# Patient Record
Sex: Female | Born: 1960 | Race: White | Hispanic: No | Marital: Married | Smoking: Never smoker
Health system: Southern US, Community
[De-identification: ages and names within clinical notes are randomized; demographics above are authoritative.]

## PROBLEM LIST (undated history)

## (undated) DIAGNOSIS — M259 Joint disorder, unspecified: Secondary | ICD-10-CM

## (undated) DIAGNOSIS — M1990 Unspecified osteoarthritis, unspecified site: Secondary | ICD-10-CM

## (undated) DIAGNOSIS — C4491 Basal cell carcinoma of skin, unspecified: Secondary | ICD-10-CM

## (undated) DIAGNOSIS — F32A Depression, unspecified: Secondary | ICD-10-CM

## (undated) DIAGNOSIS — K589 Irritable bowel syndrome without diarrhea: Secondary | ICD-10-CM

## (undated) DIAGNOSIS — H35719 Central serous chorioretinopathy, unspecified eye: Secondary | ICD-10-CM

## (undated) DIAGNOSIS — B019 Varicella without complication: Secondary | ICD-10-CM

## (undated) DIAGNOSIS — Z923 Personal history of irradiation: Secondary | ICD-10-CM

## (undated) DIAGNOSIS — O24419 Gestational diabetes mellitus in pregnancy, unspecified control: Secondary | ICD-10-CM

## (undated) DIAGNOSIS — Z889 Allergy status to unspecified drugs, medicaments and biological substances status: Secondary | ICD-10-CM

## (undated) DIAGNOSIS — R011 Cardiac murmur, unspecified: Secondary | ICD-10-CM

## (undated) DIAGNOSIS — N63 Unspecified lump in unspecified breast: Secondary | ICD-10-CM

## (undated) DIAGNOSIS — R51 Headache: Secondary | ICD-10-CM

## (undated) DIAGNOSIS — R102 Pelvic and perineal pain unspecified side: Secondary | ICD-10-CM

## (undated) DIAGNOSIS — F329 Major depressive disorder, single episode, unspecified: Secondary | ICD-10-CM

## (undated) DIAGNOSIS — K219 Gastro-esophageal reflux disease without esophagitis: Secondary | ICD-10-CM

## (undated) DIAGNOSIS — K317 Polyp of stomach and duodenum: Secondary | ICD-10-CM

## (undated) DIAGNOSIS — K760 Fatty (change of) liver, not elsewhere classified: Secondary | ICD-10-CM

## (undated) DIAGNOSIS — R519 Headache, unspecified: Secondary | ICD-10-CM

## (undated) DIAGNOSIS — L659 Nonscarring hair loss, unspecified: Secondary | ICD-10-CM

## (undated) DIAGNOSIS — E669 Obesity, unspecified: Secondary | ICD-10-CM

## (undated) DIAGNOSIS — L719 Rosacea, unspecified: Secondary | ICD-10-CM

## (undated) DIAGNOSIS — I10 Essential (primary) hypertension: Secondary | ICD-10-CM

## (undated) DIAGNOSIS — C50919 Malignant neoplasm of unspecified site of unspecified female breast: Secondary | ICD-10-CM

## (undated) DIAGNOSIS — Z87898 Personal history of other specified conditions: Secondary | ICD-10-CM

## (undated) HISTORY — DX: Pelvic and perineal pain unspecified side: R10.20

## (undated) HISTORY — DX: Gestational diabetes mellitus in pregnancy, unspecified control: O24.419

## (undated) HISTORY — DX: Unspecified lump in unspecified breast: N63.0

## (undated) HISTORY — PX: ABDOMINAL HYSTERECTOMY: SHX81

## (undated) HISTORY — DX: Nonscarring hair loss, unspecified: L65.9

## (undated) HISTORY — DX: Essential (primary) hypertension: I10

## (undated) HISTORY — DX: Personal history of other specified conditions: Z87.898

## (undated) HISTORY — DX: Malignant neoplasm of unspecified site of unspecified female breast: C50.919

## (undated) HISTORY — DX: Central serous chorioretinopathy, unspecified eye: H35.719

## (undated) HISTORY — DX: Obesity, unspecified: E66.9

## (undated) HISTORY — DX: Cardiac murmur, unspecified: R01.1

## (undated) HISTORY — DX: Joint disorder, unspecified: M25.9

## (undated) HISTORY — PX: FINGER SURGERY: SHX640

## (undated) HISTORY — DX: Gastro-esophageal reflux disease without esophagitis: K21.9

## (undated) HISTORY — DX: Basal cell carcinoma of skin, unspecified: C44.91

## (undated) HISTORY — DX: Pelvic and perineal pain: R10.2

## (undated) HISTORY — PX: SKIN CANCER EXCISION: SHX779

## (undated) HISTORY — DX: Irritable bowel syndrome, unspecified: K58.9

## (undated) HISTORY — PX: BREAST SURGERY: SHX581

## (undated) HISTORY — PX: IRRIGATION AND DEBRIDEMENT SEBACEOUS CYST: SHX5255

## (undated) HISTORY — PX: FOOT SURGERY: SHX648

## (undated) HISTORY — DX: Rosacea, unspecified: L71.9

## (undated) MED FILL — Fosaprepitant Dimeglumine For IV Infusion 150 MG (Base Eq): INTRAVENOUS | Qty: 5 | Status: AC

---
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
## 1989-07-12 HISTORY — PX: WISDOM TOOTH EXTRACTION: SHX21

## 1998-02-24 ENCOUNTER — Inpatient Hospital Stay (HOSPITAL_COMMUNITY): Admission: AD | Admit: 1998-02-24 | Discharge: 1998-02-24 | Payer: Self-pay | Admitting: Obstetrics and Gynecology

## 1998-03-14 ENCOUNTER — Encounter: Admission: RE | Admit: 1998-03-14 | Discharge: 1998-06-12 | Payer: Self-pay | Admitting: *Deleted

## 1998-03-31 ENCOUNTER — Inpatient Hospital Stay (HOSPITAL_COMMUNITY): Admission: AD | Admit: 1998-03-31 | Discharge: 1998-03-31 | Payer: Self-pay | Admitting: *Deleted

## 1998-04-02 ENCOUNTER — Inpatient Hospital Stay (HOSPITAL_COMMUNITY): Admission: AD | Admit: 1998-04-02 | Discharge: 1998-04-05 | Payer: Self-pay | Admitting: *Deleted

## 1998-04-02 DIAGNOSIS — O321XX0 Maternal care for breech presentation, not applicable or unspecified: Secondary | ICD-10-CM

## 1998-04-02 DIAGNOSIS — O24419 Gestational diabetes mellitus in pregnancy, unspecified control: Secondary | ICD-10-CM

## 1998-04-05 ENCOUNTER — Encounter (HOSPITAL_COMMUNITY): Admission: RE | Admit: 1998-04-05 | Discharge: 1998-06-20 | Payer: Self-pay | Admitting: *Deleted

## 1998-05-16 ENCOUNTER — Other Ambulatory Visit: Admission: RE | Admit: 1998-05-16 | Discharge: 1998-05-16 | Payer: Self-pay | Admitting: *Deleted

## 1999-04-21 ENCOUNTER — Other Ambulatory Visit: Admission: RE | Admit: 1999-04-21 | Discharge: 1999-04-21 | Payer: Self-pay | Admitting: *Deleted

## 2000-04-04 HISTORY — PX: COLONOSCOPY: SHX174

## 2002-07-12 DIAGNOSIS — Z1371 Encounter for nonprocreative screening for genetic disease carrier status: Secondary | ICD-10-CM

## 2002-07-12 HISTORY — DX: Encounter for nonprocreative screening for genetic disease carrier status: Z13.71

## 2004-07-12 HISTORY — PX: CARDIAC CATHETERIZATION: SHX172

## 2005-04-13 ENCOUNTER — Ambulatory Visit: Payer: Self-pay | Admitting: Internal Medicine

## 2005-04-15 ENCOUNTER — Ambulatory Visit: Payer: Self-pay | Admitting: Internal Medicine

## 2005-08-12 ENCOUNTER — Ambulatory Visit: Payer: Self-pay

## 2005-08-31 ENCOUNTER — Ambulatory Visit: Payer: Self-pay

## 2006-07-14 ENCOUNTER — Ambulatory Visit: Payer: Self-pay | Admitting: Surgery

## 2006-08-12 HISTORY — PX: BREAST BIOPSY: SHX20

## 2006-08-26 ENCOUNTER — Ambulatory Visit: Payer: Self-pay | Admitting: Surgery

## 2007-05-25 ENCOUNTER — Ambulatory Visit: Payer: Self-pay | Admitting: Unknown Physician Specialty

## 2007-06-12 HISTORY — PX: LAPAROSCOPIC SUPRACERVICAL HYSTERECTOMY: SUR797

## 2007-06-15 ENCOUNTER — Ambulatory Visit: Payer: Self-pay | Admitting: Unknown Physician Specialty

## 2007-11-07 ENCOUNTER — Ambulatory Visit: Payer: Self-pay

## 2008-01-10 HISTORY — PX: MASTECTOMY PARTIAL / LUMPECTOMY: SUR851

## 2008-02-02 ENCOUNTER — Other Ambulatory Visit: Payer: Self-pay

## 2008-02-02 ENCOUNTER — Ambulatory Visit: Payer: Self-pay | Admitting: Surgery

## 2008-02-02 ENCOUNTER — Ambulatory Visit: Payer: Self-pay | Admitting: Cardiology

## 2008-02-09 ENCOUNTER — Ambulatory Visit: Payer: Self-pay | Admitting: Surgery

## 2008-02-26 ENCOUNTER — Ambulatory Visit: Payer: Self-pay | Admitting: Surgery

## 2008-02-26 HISTORY — PX: BREAST EXCISIONAL BIOPSY: SUR124

## 2008-03-12 ENCOUNTER — Ambulatory Visit: Payer: Self-pay | Admitting: Internal Medicine

## 2008-03-14 ENCOUNTER — Ambulatory Visit: Payer: Self-pay | Admitting: Internal Medicine

## 2008-04-11 ENCOUNTER — Ambulatory Visit: Payer: Self-pay | Admitting: Internal Medicine

## 2008-05-12 ENCOUNTER — Ambulatory Visit: Payer: Self-pay | Admitting: Internal Medicine

## 2008-06-11 ENCOUNTER — Ambulatory Visit: Payer: Self-pay | Admitting: Internal Medicine

## 2008-07-12 ENCOUNTER — Ambulatory Visit: Payer: Self-pay | Admitting: Internal Medicine

## 2008-08-12 ENCOUNTER — Ambulatory Visit: Payer: Self-pay | Admitting: Internal Medicine

## 2008-09-19 ENCOUNTER — Ambulatory Visit: Payer: Self-pay

## 2008-11-09 ENCOUNTER — Ambulatory Visit: Payer: Self-pay | Admitting: Internal Medicine

## 2008-11-14 ENCOUNTER — Ambulatory Visit: Payer: Self-pay | Admitting: Internal Medicine

## 2008-12-10 ENCOUNTER — Ambulatory Visit: Payer: Self-pay | Admitting: Internal Medicine

## 2008-12-17 ENCOUNTER — Ambulatory Visit: Payer: Self-pay

## 2008-12-26 ENCOUNTER — Ambulatory Visit: Payer: Self-pay

## 2009-01-02 ENCOUNTER — Ambulatory Visit: Payer: Self-pay

## 2009-01-09 ENCOUNTER — Ambulatory Visit: Payer: Self-pay | Admitting: Internal Medicine

## 2009-02-09 ENCOUNTER — Ambulatory Visit: Payer: Self-pay | Admitting: Internal Medicine

## 2009-03-06 ENCOUNTER — Ambulatory Visit: Payer: Self-pay | Admitting: Internal Medicine

## 2009-03-12 ENCOUNTER — Ambulatory Visit: Payer: Self-pay | Admitting: Internal Medicine

## 2009-03-20 ENCOUNTER — Ambulatory Visit: Payer: Self-pay | Admitting: Surgery

## 2009-05-12 ENCOUNTER — Ambulatory Visit: Payer: Self-pay | Admitting: Internal Medicine

## 2009-05-29 ENCOUNTER — Ambulatory Visit: Payer: Self-pay | Admitting: Internal Medicine

## 2009-06-11 ENCOUNTER — Ambulatory Visit: Payer: Self-pay | Admitting: Internal Medicine

## 2009-07-12 ENCOUNTER — Ambulatory Visit: Payer: Self-pay | Admitting: Internal Medicine

## 2009-08-12 ENCOUNTER — Ambulatory Visit: Payer: Self-pay | Admitting: Internal Medicine

## 2009-08-28 ENCOUNTER — Ambulatory Visit: Payer: Self-pay | Admitting: Internal Medicine

## 2009-09-09 ENCOUNTER — Ambulatory Visit: Payer: Self-pay | Admitting: Internal Medicine

## 2009-09-18 ENCOUNTER — Ambulatory Visit: Payer: Self-pay | Admitting: Surgery

## 2009-09-25 ENCOUNTER — Ambulatory Visit: Payer: Self-pay | Admitting: Internal Medicine

## 2009-10-10 ENCOUNTER — Ambulatory Visit: Payer: Self-pay | Admitting: Internal Medicine

## 2010-01-09 ENCOUNTER — Ambulatory Visit: Payer: Self-pay | Admitting: Internal Medicine

## 2010-01-30 ENCOUNTER — Ambulatory Visit: Payer: Self-pay | Admitting: Internal Medicine

## 2010-02-09 ENCOUNTER — Ambulatory Visit: Payer: Self-pay | Admitting: Internal Medicine

## 2010-03-05 ENCOUNTER — Ambulatory Visit: Payer: Self-pay | Admitting: Internal Medicine

## 2010-03-12 ENCOUNTER — Ambulatory Visit: Payer: Self-pay | Admitting: Internal Medicine

## 2010-03-20 ENCOUNTER — Ambulatory Visit: Payer: Self-pay | Admitting: Internal Medicine

## 2010-03-24 ENCOUNTER — Ambulatory Visit: Payer: Self-pay | Admitting: Surgery

## 2010-04-11 HISTORY — PX: CHOLECYSTECTOMY: SHX55

## 2010-04-20 ENCOUNTER — Ambulatory Visit: Payer: Self-pay | Admitting: Surgery

## 2010-05-26 ENCOUNTER — Ambulatory Visit: Payer: Self-pay | Admitting: Internal Medicine

## 2010-06-11 ENCOUNTER — Ambulatory Visit: Payer: Self-pay | Admitting: Internal Medicine

## 2010-09-15 ENCOUNTER — Ambulatory Visit: Payer: Self-pay | Admitting: Internal Medicine

## 2010-10-11 ENCOUNTER — Ambulatory Visit: Payer: Self-pay | Admitting: Internal Medicine

## 2011-01-12 ENCOUNTER — Ambulatory Visit: Payer: Self-pay | Admitting: Internal Medicine

## 2011-02-10 ENCOUNTER — Ambulatory Visit: Payer: Self-pay | Admitting: Internal Medicine

## 2011-02-18 ENCOUNTER — Encounter: Payer: Self-pay | Admitting: Internal Medicine

## 2011-05-06 ENCOUNTER — Ambulatory Visit: Payer: Self-pay | Admitting: Surgery

## 2011-05-19 ENCOUNTER — Ambulatory Visit: Payer: Self-pay | Admitting: Internal Medicine

## 2011-05-21 ENCOUNTER — Ambulatory Visit: Payer: Self-pay | Admitting: Internal Medicine

## 2011-06-12 ENCOUNTER — Ambulatory Visit: Payer: Self-pay | Admitting: Internal Medicine

## 2011-07-23 ENCOUNTER — Ambulatory Visit: Payer: Self-pay | Admitting: Gynecologic Oncology

## 2011-07-27 ENCOUNTER — Ambulatory Visit: Payer: Self-pay | Admitting: Internal Medicine

## 2011-08-13 ENCOUNTER — Ambulatory Visit: Payer: Self-pay | Admitting: Internal Medicine

## 2011-10-26 ENCOUNTER — Ambulatory Visit: Payer: Self-pay | Admitting: Internal Medicine

## 2011-11-10 ENCOUNTER — Ambulatory Visit: Payer: Self-pay | Admitting: Internal Medicine

## 2011-12-11 ENCOUNTER — Ambulatory Visit: Payer: Self-pay | Admitting: Internal Medicine

## 2011-12-17 ENCOUNTER — Other Ambulatory Visit: Payer: Self-pay | Admitting: Internal Medicine

## 2011-12-17 MED ORDER — LISINOPRIL 10 MG PO TABS: 10.0000 mg | ORAL_TABLET | Freq: Every day | ORAL | Status: DC

## 2011-12-17 NOTE — Telephone Encounter (Signed)
Patient has not been seen here yet.  Please advise.

## 2011-12-20 ENCOUNTER — Ambulatory Visit (INDEPENDENT_AMBULATORY_CARE_PROVIDER_SITE_OTHER): Payer: BC Managed Care – PPO | Admitting: Internal Medicine

## 2011-12-20 ENCOUNTER — Encounter: Payer: Self-pay | Admitting: Internal Medicine

## 2011-12-20 VITALS — BP 112/70 | HR 75 | Temp 98.0°F | Resp 16 | Wt 238.8 lb

## 2011-12-20 DIAGNOSIS — E669 Obesity, unspecified: Secondary | ICD-10-CM

## 2011-12-20 DIAGNOSIS — Z853 Personal history of malignant neoplasm of breast: Secondary | ICD-10-CM

## 2011-12-20 DIAGNOSIS — I10 Essential (primary) hypertension: Secondary | ICD-10-CM | POA: Insufficient documentation

## 2011-12-20 MED ORDER — AMLODIPINE BESYLATE 10 MG PO TABS: 10.0000 mg | ORAL_TABLET | Freq: Every day | ORAL | Status: DC

## 2011-12-20 MED ORDER — LISINOPRIL 10 MG PO TABS: 10.0000 mg | ORAL_TABLET | Freq: Every day | ORAL | Status: DC

## 2011-12-20 NOTE — Patient Instructions (Signed)
Try stopping amlodipine. If BP increases >140/90 consistently, then restart at Amlodipine 5mg  daily.

## 2011-12-20 NOTE — Assessment & Plan Note (Signed)
Blood pressure very well-controlled on current medications, sometimes running low. Will try holding amlodipine and monitoring blood pressure. If blood pressure rises consistently greater than 140/90, will restart at 5 mg daily. We'll continue lisinopril. Followup 6 months or sooner if needed.

## 2011-12-20 NOTE — Assessment & Plan Note (Signed)
On tamoxifen, doing fairly well. Will request records from her current oncologist. Follow up here 6 months or sooner if needed.

## 2011-12-20 NOTE — Assessment & Plan Note (Signed)
Patient making significant efforts at healthy diet and weight loss. Encouraged her to continue with this. Encouraged physical activity, with goal of 30 minutes most days of the week. Follow up 6 months or sooner if needed.

## 2011-12-20 NOTE — Progress Notes (Signed)
Subjective:    Patient ID: Jacqueline Deleon, female    DOB: 01/28/1961, 51 y.o.   MRN: [id number]  HPI 51 year old female with history of hypertension and breast cancer presents for followup. She reports she has been doing well. In regards to her hypertension, she brings record today which shows blood pressures typically in the 110s to 120s over 60s. She has periodically held her amlodipine with no significant change in her blood pressure. She denies any chest pain, palpitations, headache.  In regards to her history of breast cancer, she reports she recently saw her oncologist and plan is for her to complete 5 years of tamoxifen. She reports some increase in joint pain in this medication and she has been taking meloxicam with improvement.  In regards to her obesity, she reports she has been making effort at healthy diet and increase physical activity. She brings record showing that her weight has trended down approximately 10 pounds over the last month.  Outpatient Encounter Prescriptions as of 12/20/2011  Medication Sig Dispense Refill  . acetaminophen (TYLENOL) 325 MG tablet Take 650 mg by mouth every 6 (six) hours as needed.      Marland Kitchen amLODipine (NORVASC) 10 MG tablet Take 1 tablet (10 mg total) by mouth daily.  30 tablet  6  . ibuprofen (ADVIL,MOTRIN) 200 MG tablet Take 200 mg by mouth every 6 (six) hours as needed.      Marland Kitchen lisinopril (PRINIVIL,ZESTRIL) 10 MG tablet Take 1 tablet (10 mg total) by mouth daily.  30 tablet  6  . meloxicam (MOBIC) 15 MG tablet Take 15 mg by mouth daily.      . metroNIDAZOLE (METROGEL) 1 % gel Apply 1 application topically daily.      . minocycline (MINOCIN,DYNACIN) 100 MG capsule Take 100 mg by mouth 2 (two) times daily.      Marland Kitchen omeprazole (PRILOSEC) 20 MG capsule Take 20 mg by mouth daily.      Marland Kitchen DISCONTD: amLODipine (NORVASC) 10 MG tablet Take 10 mg by mouth daily.      Marland Kitchen DISCONTD: lisinopril (PRINIVIL,ZESTRIL) 10 MG tablet Take 1 tablet (10 mg total) by mouth daily.   90 tablet  3   BP 112/70  Pulse 75  Temp(Src) 98 F (36.7 C) (Oral)  Resp 16  Wt 238 lb 12 oz (108.296 kg)  SpO2 96%  Review of Systems  Constitutional: Negative for fever, chills, appetite change, fatigue and unexpected weight change.  HENT: Negative for ear pain, congestion, sore throat, trouble swallowing, neck pain, voice change and sinus pressure.   Eyes: Negative for visual disturbance.  Respiratory: Negative for cough, shortness of breath, wheezing and stridor.   Cardiovascular: Negative for chest pain, palpitations and leg swelling.  Gastrointestinal: Negative for nausea, vomiting, abdominal pain, diarrhea, constipation, blood in stool, abdominal distention and anal bleeding.  Genitourinary: Negative for dysuria and flank pain.  Musculoskeletal: Negative for myalgias, arthralgias and gait problem.  Skin: Negative for color change and rash.  Neurological: Negative for dizziness and headaches.  Hematological: Negative for adenopathy. Does not bruise/bleed easily.  Psychiatric/Behavioral: Negative for suicidal ideas, sleep disturbance and dysphoric mood. The patient is not nervous/anxious.        Objective:   Physical Exam  Constitutional: She is oriented to person, place, and time. She appears well-developed and well-nourished. No distress.  HENT:  Head: Normocephalic and atraumatic.  Right Ear: External ear normal.  Left Ear: External ear normal.  Nose: Nose normal.  Mouth/Throat: Oropharynx is clear and  moist. No oropharyngeal exudate.  Eyes: Conjunctivae are normal. Pupils are equal, round, and reactive to light. Right eye exhibits no discharge. Left eye exhibits no discharge. No scleral icterus.  Neck: Normal range of motion. Neck supple. No tracheal deviation present. No thyromegaly present.  Cardiovascular: Normal rate, regular rhythm, normal heart sounds and intact distal pulses.  Exam reveals no gallop and no friction rub.   No murmur heard. Pulmonary/Chest:  Effort normal and breath sounds normal. No respiratory distress. She has no wheezes. She has no rales. She exhibits no tenderness.  Musculoskeletal: Normal range of motion. She exhibits no edema and no tenderness.  Lymphadenopathy:    She has no cervical adenopathy.  Neurological: She is alert and oriented to person, place, and time. No cranial nerve deficit. She exhibits normal muscle tone. Coordination normal.  Skin: Skin is warm and dry. No rash noted. She is not diaphoretic. No erythema. No pallor.  Psychiatric: She has a normal mood and affect. Her behavior is normal. Judgment and thought content normal.          Assessment & Plan:

## 2011-12-22 ENCOUNTER — Encounter: Payer: Self-pay | Admitting: Internal Medicine

## 2012-05-12 LAB — HM MAMMOGRAPHY: HM Mammogram: NORMAL

## 2012-05-18 ENCOUNTER — Ambulatory Visit: Payer: Self-pay | Admitting: Surgery

## 2012-05-22 ENCOUNTER — Ambulatory Visit (INDEPENDENT_AMBULATORY_CARE_PROVIDER_SITE_OTHER): Payer: 59 | Admitting: Internal Medicine

## 2012-05-22 ENCOUNTER — Ambulatory Visit: Payer: Self-pay | Admitting: Internal Medicine

## 2012-05-22 ENCOUNTER — Telehealth: Payer: Self-pay | Admitting: Internal Medicine

## 2012-05-22 ENCOUNTER — Other Ambulatory Visit: Payer: Self-pay | Admitting: Internal Medicine

## 2012-05-22 ENCOUNTER — Encounter: Payer: Self-pay | Admitting: Internal Medicine

## 2012-05-22 VITALS — BP 132/62 | HR 71 | Temp 99.3°F | Ht 63.0 in | Wt 238.0 lb

## 2012-05-22 DIAGNOSIS — R1031 Right lower quadrant pain: Secondary | ICD-10-CM | POA: Insufficient documentation

## 2012-05-22 MED ORDER — CIPROFLOXACIN HCL 500 MG PO TABS: 500.0000 mg | ORAL_TABLET | Freq: Two times a day (BID) | ORAL | Status: DC

## 2012-05-22 NOTE — Telephone Encounter (Signed)
Patient advised as instructed via telephone, she wanted to know what her lab results are.  I called [facility] and spoke to CSR and they will fax the labs to our office within 3-5 minutes.

## 2012-05-22 NOTE — Assessment & Plan Note (Signed)
Symptoms and exam are suggestive of appendicitis. However, CT of the abdomen performed today showed no abnormality of the appendix or bowel. Labs today including CBC, CMP were normal. No symptoms to suggest viral gastroenteritis. Urinalysis showed blood however no nephrolithiasis noted on CT scan and no symptoms of dysuria. Will send urine for culture. Will treat empirically with Cipro. Followup in 3 days. Patient will call sooner if symptoms are worsening.

## 2012-05-22 NOTE — Progress Notes (Signed)
Subjective:    Patient ID: Jacqueline Deleon, female    DOB: Nov 26, 1960, 51 y.o.   MRN: [id number]  HPI 51 year old female presents for acute visit complaining of one-week history of right lower quadrant abdominal pain and fever. She denies any nausea, vomiting, diarrhea, constipation, blood in her stool. She reports intermittent low-grade temperature and aching, crampy right lower abdominal pain times one week. She denies any change in appetite. She denies any dysuria, hematuria, urinary urgency. She has not been taking any medication for her symptoms.  Outpatient Encounter Prescriptions as of 05/22/2012  Medication Sig Dispense Refill  . acetaminophen (TYLENOL) 325 MG tablet Take 650 mg by mouth every 6 (six) hours as needed.      Marland Kitchen amLODipine (NORVASC) 10 MG tablet Take 1 tablet (10 mg total) by mouth daily.  30 tablet  6  . ibuprofen (ADVIL,MOTRIN) 200 MG tablet Take 200 mg by mouth every 6 (six) hours as needed.      Marland Kitchen lisinopril (PRINIVIL,ZESTRIL) 10 MG tablet Take 1 tablet (10 mg total) by mouth daily.  30 tablet  6  . meloxicam (MOBIC) 15 MG tablet Take 15 mg by mouth daily.      . metroNIDAZOLE (METROGEL) 1 % gel Apply 1 application topically daily.      . minocycline (MINOCIN,DYNACIN) 100 MG capsule Take 100 mg by mouth 2 (two) times daily.      Marland Kitchen omeprazole (PRILOSEC) 20 MG capsule Take 20 mg by mouth daily.      . tamoxifen (NOLVADEX) 20 MG tablet Take 20 mg by mouth daily.       BP 132/62  Pulse 71  Temp 99.3 F (37.4 C) (Oral)  Ht 5\' 3"  (1.6 m)  Wt 238 lb (107.956 kg)  BMI 42.16 kg/m2  SpO2 98%  Review of Systems  Constitutional: Positive for fever. Negative for chills, appetite change, fatigue and unexpected weight change.  HENT: Negative for ear pain, congestion, sore throat, trouble swallowing, neck pain, voice change and sinus pressure.   Eyes: Negative for visual disturbance.  Respiratory: Negative for cough, shortness of breath, wheezing and stridor.     Cardiovascular: Negative for chest pain, palpitations and leg swelling.  Gastrointestinal: Positive for abdominal pain. Negative for nausea, vomiting, diarrhea, constipation, blood in stool, abdominal distention and anal bleeding.  Genitourinary: Negative for dysuria and flank pain.  Musculoskeletal: Negative for myalgias, arthralgias and gait problem.  Skin: Negative for color change and rash.  Neurological: Negative for dizziness and headaches.  Hematological: Negative for adenopathy. Does not bruise/bleed easily.  Psychiatric/Behavioral: Negative for suicidal ideas, sleep disturbance and dysphoric mood. The patient is not nervous/anxious.        Objective:   Physical Exam  Constitutional: She is oriented to person, place, and time. She appears well-developed and well-nourished. No distress.  HENT:  Head: Normocephalic and atraumatic.  Right Ear: External ear normal.  Left Ear: External ear normal.  Nose: Nose normal.  Mouth/Throat: Oropharynx is clear and moist. No oropharyngeal exudate.  Eyes: Conjunctivae normal are normal. Pupils are equal, round, and reactive to light. Right eye exhibits no discharge. Left eye exhibits no discharge. No scleral icterus.  Neck: Normal range of motion. Neck supple. No tracheal deviation present. No thyromegaly present.  Cardiovascular: Normal rate, regular rhythm, normal heart sounds and intact distal pulses.  Exam reveals no gallop and no friction rub.   No murmur heard. Pulmonary/Chest: Effort normal and breath sounds normal. No respiratory distress. She has no wheezes. She has  no rales. She exhibits no tenderness.  Abdominal: Soft. Bowel sounds are normal. She exhibits no distension and no mass. There is tenderness (right lower quad, mcburneys pt). There is no rebound and no guarding.  Musculoskeletal: Normal range of motion. She exhibits no edema and no tenderness.  Lymphadenopathy:    She has no cervical adenopathy.  Neurological: She is alert  and oriented to person, place, and time. No cranial nerve deficit. She exhibits normal muscle tone. Coordination normal.  Skin: Skin is warm and dry. No rash noted. She is not diaphoretic. No erythema. No pallor.  Psychiatric: She has a normal mood and affect. Her behavior is normal. Judgment and thought content normal.          Assessment & Plan:

## 2012-05-22 NOTE — Telephone Encounter (Signed)
Lab results from [facility] given to Dr. Dan Humphreys.

## 2012-05-22 NOTE — Telephone Encounter (Signed)
Labs showed blood in the urine, which may indicate renal stone or UTI.  No renal stone seen on imaging.  So, would like to treat as UTI with Cipro 500mg  po bid x 7 days.  Follow up this week as scheduled

## 2012-05-22 NOTE — Telephone Encounter (Signed)
Patient advised as instructed via telephone, Rx for Cipro sent to [facility] pharmacy, appt scheduled with Dr. Dan Humphreys on 05/26/2012 at 4:00.

## 2012-05-22 NOTE — Telephone Encounter (Signed)
Message sent to pt through MyChart CT of the abdomen was normal with no abnormalities of the appendix or bowel. It is possible that your symptoms are secondary to a viral infection. I think that we should monitor your symptoms this week, and if symptoms persist, then consider pelvic ultrasound for further evaluation.  It will also be helpful for me to see lab reports, including urinalysis, however I do not have these back yet. Please let us know if symptoms are worsening.

## 2012-05-23 LAB — URINALYSIS, ROUTINE W REFLEX MICROSCOPIC
Bilirubin, UA: NEGATIVE
Ketones, UA: NEGATIVE
Nitrite, UA: NEGATIVE
Specific Gravity, UA: 1.017 (ref 1.005–1.030)
Urobilinogen, Ur: 0.2 mg/dL (ref 0.0–1.9)

## 2012-05-23 LAB — CBC WITH DIFFERENTIAL
Basophils Absolute: 0 10*3/uL (ref 0.0–0.2)
Eosinophils Absolute: 0.1 10*3/uL (ref 0.0–0.4)
Hemoglobin: 14 g/dL (ref 11.1–15.9)
Immature Grans (Abs): 0 10*3/uL (ref 0.0–0.1)
Immature Granulocytes: 0 % (ref 0–2)
Lymphs: 26 % (ref 14–46)
MCHC: 32.9 g/dL (ref 31.5–35.7)
Neutrophils Relative %: 62 % (ref 40–74)
Platelets: 233 10*3/uL (ref 155–379)
RDW: 15.5 % — ABNORMAL HIGH (ref 12.3–15.4)

## 2012-05-23 LAB — COMPREHENSIVE METABOLIC PANEL
ALT: 25 IU/L (ref 0–32)
AST: 19 IU/L (ref 0–40)
Calcium: 9.8 mg/dL (ref 8.7–10.2)
Chloride: 106 mmol/L (ref 97–108)
Glucose: 81 mg/dL (ref 65–99)
Potassium: 4.4 mmol/L (ref 3.5–5.2)
Total Protein: 7.3 g/dL (ref 6.0–8.5)

## 2012-05-23 LAB — MICROSCOPIC EXAMINATION

## 2012-05-23 LAB — APTT: aPTT: 28 s (ref 24–33)

## 2012-05-23 LAB — PROTIME-INR: INR: 1 (ref 0.8–1.2)

## 2012-05-26 ENCOUNTER — Ambulatory Visit (INDEPENDENT_AMBULATORY_CARE_PROVIDER_SITE_OTHER): Payer: 59 | Admitting: Internal Medicine

## 2012-05-26 ENCOUNTER — Encounter: Payer: Self-pay | Admitting: Internal Medicine

## 2012-05-26 ENCOUNTER — Ambulatory Visit: Payer: 59 | Admitting: Internal Medicine

## 2012-05-26 VITALS — BP 112/72 | HR 74 | Temp 98.7°F | Ht 63.0 in | Wt 243.0 lb

## 2012-05-26 DIAGNOSIS — R1031 Right lower quadrant pain: Secondary | ICD-10-CM

## 2012-05-26 NOTE — Progress Notes (Signed)
Subjective:    Patient ID: Jacqueline Deleon, female    DOB: [birth year], 51 y.o.   MRN: [id number]  HPI 51 year old female with history of hypertension presents for followup after recent episode of right lower quadrant abdominal pain and fever. On exam earlier this week she had tenderness over McBurney's point consistent with appendicitis. She went for CT of the abdomen which showed no inflammation of the appendix. CT was completely normal. Lab work was also normal except for urinalysis which showed hematuria. She was started on Cipro for possible urinary tract infection. She reports that fever and symptoms of lower abdominal pain has completely resolved. She denies any new concerns today.  Outpatient Encounter Prescriptions as of 05/26/2012  Medication Sig Dispense Refill  . acetaminophen (TYLENOL) 325 MG tablet Take 650 mg by mouth every 6 (six) hours as needed.      Marland Kitchen amLODipine (NORVASC) 10 MG tablet Take 1 tablet (10 mg total) by mouth daily.  30 tablet  6  . ciprofloxacin (CIPRO) 500 MG tablet Take 1 tablet (500 mg total) by mouth 2 (two) times daily.  14 tablet  0  . ibuprofen (ADVIL,MOTRIN) 200 MG tablet Take 200 mg by mouth every 6 (six) hours as needed.      Marland Kitchen lisinopril (PRINIVIL,ZESTRIL) 10 MG tablet Take 1 tablet (10 mg total) by mouth daily.  30 tablet  6  . meloxicam (MOBIC) 15 MG tablet Take 15 mg by mouth daily.      . metroNIDAZOLE (METROGEL) 1 % gel Apply 1 application topically daily.      . minocycline (MINOCIN,DYNACIN) 100 MG capsule Take 100 mg by mouth 2 (two) times daily.      Marland Kitchen omeprazole (PRILOSEC) 20 MG capsule Take 20 mg by mouth daily.      . tamoxifen (NOLVADEX) 20 MG tablet Take 20 mg by mouth daily.       BP 112/72  Pulse 74  Temp 98.7 F (37.1 C) (Oral)  Ht 5\' 3"  (1.6 m)  Wt 243 lb (110.224 kg)  BMI 43.05 kg/m2  SpO2 97%  Review of Systems  Constitutional: Negative for fever, chills, appetite change, fatigue and unexpected weight change.  HENT: Negative  for ear pain, congestion, sore throat, trouble swallowing, neck pain, voice change and sinus pressure.   Eyes: Negative for visual disturbance.  Respiratory: Negative for cough, shortness of breath, wheezing and stridor.   Cardiovascular: Negative for chest pain, palpitations and leg swelling.  Gastrointestinal: Negative for nausea, vomiting, abdominal pain, diarrhea, constipation, blood in stool, abdominal distention and anal bleeding.  Genitourinary: Negative for dysuria, urgency, frequency, hematuria, flank pain, vaginal pain and pelvic pain.  Musculoskeletal: Negative for myalgias, arthralgias and gait problem.  Skin: Negative for color change and rash.  Neurological: Negative for dizziness and headaches.  Hematological: Negative for adenopathy. Does not bruise/bleed easily.  Psychiatric/Behavioral: Negative for suicidal ideas, sleep disturbance and dysphoric mood. The patient is not nervous/anxious.        Objective:   Physical Exam  Constitutional: She is oriented to person, place, and time. She appears well-developed and well-nourished. No distress.  HENT:  Head: Normocephalic and atraumatic.  Eyes: Pupils are equal, round, and reactive to light.  Neck: Normal range of motion. Neck supple.  Pulmonary/Chest: Effort normal.  Abdominal: Soft. Bowel sounds are normal. She exhibits no distension and no mass. There is no tenderness. There is no rebound and no guarding.  Neurological: She is alert and oriented to person, place, and time.  Skin: Skin is warm and dry. She is not diaphoretic.  Psychiatric: She has a normal mood and affect. Her behavior is normal. Judgment and thought content normal.          Assessment & Plan:

## 2012-05-26 NOTE — Assessment & Plan Note (Signed)
Symptoms of right lower quadrant abdominal pain have resolved. Fever has resolved. Exam normal today. CT of the abdomen was normal. Suspect that symptoms were secondary to urinary tract infection. Will continue to monitor for now. If any recurrence of symptoms, patient will call or e-mail. Followup as scheduled in 3 months or sooner as needed.

## 2012-06-06 ENCOUNTER — Encounter: Payer: Self-pay | Admitting: General Practice

## 2012-06-20 ENCOUNTER — Ambulatory Visit: Payer: BC Managed Care – PPO | Admitting: Internal Medicine

## 2012-07-20 ENCOUNTER — Ambulatory Visit: Payer: BC Managed Care – PPO | Admitting: Internal Medicine

## 2012-07-24 ENCOUNTER — Ambulatory Visit (INDEPENDENT_AMBULATORY_CARE_PROVIDER_SITE_OTHER): Payer: 59 | Admitting: Internal Medicine

## 2012-07-24 ENCOUNTER — Encounter: Payer: Self-pay | Admitting: Internal Medicine

## 2012-07-24 VITALS — BP 122/84 | HR 84 | Temp 98.8°F | Ht 63.0 in | Wt 245.0 lb

## 2012-07-24 DIAGNOSIS — I10 Essential (primary) hypertension: Secondary | ICD-10-CM

## 2012-07-24 DIAGNOSIS — R229 Localized swelling, mass and lump, unspecified: Secondary | ICD-10-CM | POA: Insufficient documentation

## 2012-07-24 DIAGNOSIS — E669 Obesity, unspecified: Secondary | ICD-10-CM

## 2012-07-24 MED ORDER — LORCASERIN HCL 10 MG PO TABS: 10.0000 mg | ORAL_TABLET | Freq: Two times a day (BID) | ORAL | Status: DC

## 2012-07-24 NOTE — Assessment & Plan Note (Signed)
BP well controlled on current medications. Will continue. Follow up 6 months and prn.

## 2012-07-24 NOTE — Progress Notes (Signed)
Subjective:    Patient ID: Jacqueline Deleon, female    DOB: 1961-05-08, 52 y.o.   MRN: [id number]  HPI 52YO female with HTN presents for follow up. BP well controlled on current meds. No HA, chest pain, palpitations. Concerned today about weight. Would like to try medication to suppress appetite. Not following any specific diet or exercise program. Has set goal of 20lb weight loss by March 2014. Also concerned about some nodular areas over abdominal wall. Unsure how long these have been present. Non-tender. Have not changed in size last few months.  Outpatient Encounter Prescriptions as of 07/24/2012  Medication Sig Dispense Refill  . acetaminophen (TYLENOL) 325 MG tablet Take 650 mg by mouth every 6 (six) hours as needed.      Marland Kitchen amLODipine (NORVASC) 10 MG tablet Take 5 mg by mouth daily.      Marland Kitchen ibuprofen (ADVIL,MOTRIN) 200 MG tablet Take 200 mg by mouth every 6 (six) hours as needed.      Marland Kitchen lisinopril (PRINIVIL,ZESTRIL) 10 MG tablet Take 1 tablet (10 mg total) by mouth daily.  30 tablet  6  . meloxicam (MOBIC) 15 MG tablet Take 15 mg by mouth daily.      . metroNIDAZOLE (METROGEL) 1 % gel Apply 1 application topically daily.      . minocycline (MINOCIN,DYNACIN) 100 MG capsule Take 100 mg by mouth 2 (two) times daily.      Marland Kitchen omeprazole (PRILOSEC) 20 MG capsule Take 20 mg by mouth daily.      . tamoxifen (NOLVADEX) 20 MG tablet Take 20 mg by mouth daily.      . [DISCONTINUED] amLODipine (NORVASC) 10 MG tablet Take 1 tablet (10 mg total) by mouth daily.  30 tablet  6  . Lorcaserin HCl (BELVIQ) 10 MG TABS Take 10 mg by mouth 2 (two) times daily.  60 tablet  1  . [DISCONTINUED] ciprofloxacin (CIPRO) 500 MG tablet Take 1 tablet (500 mg total) by mouth 2 (two) times daily.  14 tablet  0   BP 122/84  Pulse 84  Temp 98.8 F (37.1 C) (Oral)  Ht 5\' 3"  (1.6 m)  Wt 245 lb (111.131 kg)  BMI 43.40 kg/m2  SpO2 97%  Review of Systems  Constitutional: Negative for fever, chills, appetite change, fatigue  and unexpected weight change.  HENT: Negative for ear pain, congestion, sore throat, trouble swallowing, neck pain, voice change and sinus pressure.   Eyes: Negative for visual disturbance.  Respiratory: Negative for cough, shortness of breath, wheezing and stridor.   Cardiovascular: Negative for chest pain, palpitations and leg swelling.  Gastrointestinal: Negative for nausea, vomiting, abdominal pain, diarrhea, constipation, blood in stool, abdominal distention and anal bleeding.  Genitourinary: Negative for dysuria and flank pain.  Musculoskeletal: Negative for myalgias, arthralgias and gait problem.  Skin: Negative for color change and rash.  Neurological: Negative for dizziness and headaches.  Hematological: Negative for adenopathy. Does not bruise/bleed easily.  Psychiatric/Behavioral: Negative for suicidal ideas, sleep disturbance and dysphoric mood. The patient is not nervous/anxious.        Objective:   Physical Exam  Constitutional: She is oriented to person, place, and time. She appears well-developed and well-nourished. No distress.  HENT:  Head: Normocephalic and atraumatic.  Right Ear: External ear normal.  Left Ear: External ear normal.  Nose: Nose normal.  Mouth/Throat: Oropharynx is clear and moist. No oropharyngeal exudate.  Eyes: Conjunctivae normal are normal. Pupils are equal, round, and reactive to light. Right eye exhibits no  discharge. Left eye exhibits no discharge. No scleral icterus.  Neck: Normal range of motion. Neck supple. No tracheal deviation present. No thyromegaly present.  Cardiovascular: Normal rate, regular rhythm, normal heart sounds and intact distal pulses.  Exam reveals no gallop and no friction rub.   No murmur heard. Pulmonary/Chest: Effort normal and breath sounds normal. No respiratory distress. She has no wheezes. She has no rales. She exhibits no tenderness.  Abdominal: Soft. Normal appearance. There is no tenderness.    Musculoskeletal:  Normal range of motion. She exhibits no edema and no tenderness.  Lymphadenopathy:    She has no cervical adenopathy.  Neurological: She is alert and oriented to person, place, and time. No cranial nerve deficit. She exhibits normal muscle tone. Coordination normal.  Skin: Skin is warm and dry. No rash noted. She is not diaphoretic. No erythema. No pallor.  Psychiatric: She has a normal mood and affect. Her behavior is normal. Judgment and thought content normal.          Assessment & Plan:

## 2012-07-24 NOTE — Assessment & Plan Note (Signed)
Located over abdomen. Most consistent with lipoma or scar tissue from previous surgery. Discussed referral for [place] evaluation. Pt would prefer to monitor for now.

## 2012-07-24 NOTE — Patient Instructions (Signed)
[email or web address]  

## 2012-07-24 NOTE — Assessment & Plan Note (Signed)
BMI 43. Pt has set goal of losing 20lb by March. She would like to use medications to help with appetite suppression. We discussed that medications such as phentermine and Qsymia may elevate BP and would not be safe for her. Will try Belviq. Discussed potential side effects and use for 3 months.  Encouraged her to keep a food diary and set goal of <1800 calories per day.  Encouraged her to increase physical activity, with goal of 3 times per week. Follow up 1 month.

## 2012-08-12 HISTORY — PX: EYE SURGERY: SHX253

## 2012-12-07 ENCOUNTER — Ambulatory Visit: Payer: Self-pay | Admitting: Internal Medicine

## 2012-12-10 ENCOUNTER — Ambulatory Visit: Payer: Self-pay | Admitting: Internal Medicine

## 2013-01-01 ENCOUNTER — Other Ambulatory Visit: Payer: Self-pay | Admitting: *Deleted

## 2013-01-01 DIAGNOSIS — I10 Essential (primary) hypertension: Secondary | ICD-10-CM

## 2013-01-01 MED ORDER — LISINOPRIL 10 MG PO TABS: 10.0000 mg | ORAL_TABLET | Freq: Every day | ORAL | Status: DC

## 2013-01-01 NOTE — Telephone Encounter (Signed)
Eprescribed.

## 2013-02-16 ENCOUNTER — Other Ambulatory Visit: Payer: Self-pay | Admitting: *Deleted

## 2013-02-16 MED ORDER — AMLODIPINE BESYLATE 10 MG PO TABS: 5.0000 mg | ORAL_TABLET | Freq: Every day | ORAL | Status: DC

## 2013-02-16 NOTE — Telephone Encounter (Signed)
Eprescribed.

## 2013-03-05 ENCOUNTER — Telehealth: Payer: Self-pay | Admitting: Internal Medicine

## 2013-03-05 ENCOUNTER — Other Ambulatory Visit: Payer: Self-pay | Admitting: *Deleted

## 2013-03-05 DIAGNOSIS — I10 Essential (primary) hypertension: Secondary | ICD-10-CM

## 2013-03-05 MED ORDER — AMLODIPINE BESYLATE 10 MG PO TABS: 5.0000 mg | ORAL_TABLET | Freq: Every day | ORAL | Status: DC

## 2013-03-05 MED ORDER — LISINOPRIL 10 MG PO TABS: 10.0000 mg | ORAL_TABLET | Freq: Every day | ORAL | Status: DC

## 2013-03-05 NOTE — Telephone Encounter (Signed)
Patient called stating she needs a refill on her Norvasc and lisinopril, she is out and does not have a f/u until 04/23/13. She is using [facility]

## 2013-03-05 NOTE — Telephone Encounter (Signed)
Sent 3 mth refills to [facility] electronically

## 2013-03-05 NOTE — Telephone Encounter (Signed)
We can send in 3 months of refills on these meds.

## 2013-04-23 ENCOUNTER — Encounter: Payer: Self-pay | Admitting: Internal Medicine

## 2013-04-23 ENCOUNTER — Ambulatory Visit (INDEPENDENT_AMBULATORY_CARE_PROVIDER_SITE_OTHER): Payer: 59 | Admitting: Internal Medicine

## 2013-04-23 VITALS — BP 120/80 | HR 89 | Temp 98.3°F | Wt 242.0 lb

## 2013-04-23 DIAGNOSIS — I10 Essential (primary) hypertension: Secondary | ICD-10-CM

## 2013-04-23 DIAGNOSIS — L259 Unspecified contact dermatitis, unspecified cause: Secondary | ICD-10-CM | POA: Insufficient documentation

## 2013-04-23 MED ORDER — LISINOPRIL 10 MG PO TABS: 10.0000 mg | ORAL_TABLET | Freq: Every day | ORAL | Status: DC

## 2013-04-23 MED ORDER — AMLODIPINE BESYLATE 10 MG PO TABS: 5.0000 mg | ORAL_TABLET | Freq: Every day | ORAL | Status: DC

## 2013-04-23 NOTE — Assessment & Plan Note (Signed)
Symptoms consistent with contact dermatitis likely from poison ivy or poison oak. 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 She will call if symptoms not improving.

## 2013-04-23 NOTE — Assessment & Plan Note (Signed)
BP Readings from Last 3 Encounters:  04/23/13 120/80  07/24/12 122/84  05/26/12 112/72   BP generally well controlled on current medications. Will check renal function with labs today. Continue current medications.

## 2013-04-23 NOTE — Progress Notes (Signed)
Subjective:    Patient ID: Jacqueline Deleon, female    DOB: July 21, 1960, 52 y.o.   MRN: [id number]  HPI 52 year old female with history of obesity, hypertension, breast cancer presents for followup. Her primary concern today is 1-1/2 week history of rash on her lower legs and trunk. She reports she was working in her garden the next day developed a blistering rash just above her sock line on both of her legs. The rash is described as red and itchy. 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  In regards to blood pressure, she brings record with her today showing most blood pressure readings near 130/80 or less. She did have one reading of 150/86 in the setting of headache. She is compliant with medications.  Outpatient Encounter Prescriptions as of 04/23/2013  Medication Sig Dispense Refill  . acetaminophen (TYLENOL) 325 MG tablet Take 650 mg by mouth every 6 (six) hours as needed.      Marland Kitchen amLODipine (NORVASC) 10 MG tablet Take 0.5 tablets (5 mg total) by mouth daily.  90 tablet  3  . doxepin (SINEQUAN) 100 MG capsule Take 100 mg by mouth 3 (three) times daily.      Marland Kitchen ibuprofen (ADVIL,MOTRIN) 200 MG tablet Take 200 mg by mouth every 6 (six) hours as needed.      Marland Kitchen lisinopril (PRINIVIL,ZESTRIL) 10 MG tablet Take 1 tablet (10 mg total) by mouth daily.  90 tablet  3  . metroNIDAZOLE (METROGEL) 1 % gel Apply 1 application topically daily.      . minocycline (MINOCIN,DYNACIN) 100 MG capsule Take 100 mg by mouth 2 (two) times daily.      Marland Kitchen omeprazole (PRILOSEC) 20 MG capsule Take 20 mg by mouth daily.      . tamoxifen (NOLVADEX) 20 MG tablet Take 20 mg by mouth daily.      Marland Kitchen triamcinolone cream (KENALOG) 0.5 % Apply 1 application topically 3 (three) times daily.       No facility-administered encounter medications on file as of 04/23/2013.   BP 120/80  Pulse 89  Temp(Src) 98.3 F (36.8 C)  (Oral)  Wt 242 lb (109.77 kg)  BMI 42.88 kg/m2  SpO2 98%  Review of Systems  Constitutional: Negative for fever, chills, appetite change, fatigue and unexpected weight change.  HENT: Negative for congestion, ear pain, sinus pressure, sore throat, trouble swallowing and voice change.   Eyes: Negative for visual disturbance.  Respiratory: Negative for cough, shortness of breath, wheezing and stridor.   Cardiovascular: Negative for chest pain, palpitations and leg swelling.  Gastrointestinal: Negative for nausea, vomiting, abdominal pain, diarrhea, constipation, blood in stool, abdominal distention and anal bleeding.  Genitourinary: Negative for dysuria and flank pain.  Musculoskeletal: Negative for arthralgias, gait problem, myalgias and neck pain.  Skin: Positive for color change and rash.  Neurological: Negative for dizziness and headaches.  Hematological: Negative for adenopathy. Does not bruise/bleed easily.  Psychiatric/Behavioral: Negative for suicidal ideas, sleep disturbance and dysphoric mood. The patient is not nervous/anxious.        Objective:   Physical Exam  Constitutional: She is oriented to person, place, and time. She appears well-developed and well-nourished. No distress.  HENT:  Head: Normocephalic and atraumatic.  Right Ear: External ear normal.  Left Ear: External ear normal.  Nose: Nose normal.  Mouth/Throat: Oropharynx is clear and moist. No oropharyngeal exudate.  Eyes: Conjunctivae are normal.  Pupils are equal, round, and reactive to light. Right eye exhibits no discharge. Left eye exhibits no discharge. No scleral icterus.  Neck: Normal range of motion. Neck supple. No tracheal deviation present. No thyromegaly present.  Cardiovascular: Normal rate, regular rhythm, normal heart sounds and intact distal pulses.  Exam reveals no gallop and no friction rub.   No murmur heard. Pulmonary/Chest: Effort normal and breath sounds normal. No accessory muscle usage. Not  tachypneic. No respiratory distress. She has no decreased breath sounds. She has no wheezes. She has no rhonchi. She has no rales. She exhibits no tenderness.  Musculoskeletal: Normal range of motion. She exhibits no edema and no tenderness.  Lymphadenopathy:    She has no cervical adenopathy.  Neurological: She is alert and oriented to person, place, and time. No cranial nerve deficit. She exhibits normal muscle tone. Coordination normal.  Skin: Skin is warm and dry. Rash noted. Rash is vesicular (bilateral ankles, just above sock line and left lateral trunk above pantline.). She is not diaphoretic. There is erythema. No pallor.  Psychiatric: She has a normal mood and affect. Her behavior is normal. Judgment and thought content normal.          Assessment & Plan:

## 2013-05-17 ENCOUNTER — Other Ambulatory Visit: Payer: Self-pay

## 2013-05-22 ENCOUNTER — Ambulatory Visit: Payer: Self-pay | Admitting: Surgery

## 2013-06-01 ENCOUNTER — Other Ambulatory Visit: Payer: Self-pay | Admitting: *Deleted

## 2013-06-01 MED ORDER — MELOXICAM 15 MG PO TABS: 15.0000 mg | ORAL_TABLET | Freq: Every day | ORAL | Status: DC

## 2013-06-01 NOTE — Telephone Encounter (Signed)
FAXED REFILL REQUEST CAME FROM [facility]

## 2013-06-05 ENCOUNTER — Telehealth: Payer: Self-pay | Admitting: *Deleted

## 2013-06-05 MED ORDER — MELOXICAM 15 MG PO TABS: 15.0000 mg | ORAL_TABLET | Freq: Every day | ORAL | Status: DC

## 2013-06-05 NOTE — Telephone Encounter (Signed)
Ok to refill 

## 2013-06-05 NOTE — Telephone Encounter (Signed)
PHARMACY SENT FAX REFILL REQUEST FOR MELOXICAM 15 MG #30 TAKE ONE TABLET EACH DAY

## 2013-06-05 NOTE — Telephone Encounter (Signed)
PHARMACY  [facility] FAXED REFILL REQ FOR MELOXICAM 15 MG #30 TAKE ONE TABLET EACH DAY

## 2013-06-06 ENCOUNTER — Telehealth: Payer: Self-pay | Admitting: *Deleted

## 2013-06-06 NOTE — Telephone Encounter (Signed)
RECEIVED FAX REFILL REQUEST , OK TO FILL PER DR HYATT

## 2013-11-19 ENCOUNTER — Ambulatory Visit: Payer: Self-pay | Admitting: Gastroenterology

## 2013-12-10 ENCOUNTER — Ambulatory Visit: Payer: Self-pay | Admitting: Internal Medicine

## 2014-01-09 ENCOUNTER — Ambulatory Visit: Payer: Self-pay | Admitting: Internal Medicine

## 2014-02-19 ENCOUNTER — Other Ambulatory Visit: Payer: Self-pay | Admitting: Internal Medicine

## 2014-04-26 ENCOUNTER — Other Ambulatory Visit: Payer: Self-pay

## 2014-05-02 ENCOUNTER — Other Ambulatory Visit: Payer: Self-pay | Admitting: Internal Medicine

## 2014-05-30 ENCOUNTER — Ambulatory Visit: Payer: Self-pay

## 2015-02-20 ENCOUNTER — Other Ambulatory Visit: Payer: Self-pay | Admitting: Certified Nurse Midwife

## 2015-02-20 DIAGNOSIS — R748 Abnormal levels of other serum enzymes: Secondary | ICD-10-CM

## 2015-02-27 ENCOUNTER — Ambulatory Visit
Admission: RE | Admit: 2015-02-27 | Discharge: 2015-02-27 | Disposition: A | Discharge status: Home / Self Care | Payer: Commercial Managed Care - HMO | Source: Ambulatory Visit | Attending: Certified Nurse Midwife | Admitting: Certified Nurse Midwife

## 2015-02-27 DIAGNOSIS — R748 Abnormal levels of other serum enzymes: Secondary | ICD-10-CM

## 2015-02-27 DIAGNOSIS — K760 Fatty (change of) liver, not elsewhere classified: Secondary | ICD-10-CM | POA: Diagnosis not present

## 2015-02-27 DIAGNOSIS — R1011 Right upper quadrant pain: Secondary | ICD-10-CM | POA: Diagnosis present

## 2015-02-27 DIAGNOSIS — Z9049 Acquired absence of other specified parts of digestive tract: Secondary | ICD-10-CM | POA: Diagnosis not present

## 2015-04-30 ENCOUNTER — Encounter: Payer: Self-pay | Admitting: *Deleted

## 2015-05-01 ENCOUNTER — Encounter
Admission: RE | Disposition: A | Discharge status: Home / Self Care | Payer: Self-pay | Source: Ambulatory Visit | Attending: Gastroenterology

## 2015-05-01 ENCOUNTER — Ambulatory Visit: Payer: Commercial Managed Care - HMO | Admitting: Certified Registered Nurse Anesthetist

## 2015-05-01 ENCOUNTER — Encounter: Payer: Self-pay | Admitting: Anesthesiology

## 2015-05-01 ENCOUNTER — Ambulatory Visit
Admission: RE | Admit: 2015-05-01 | Discharge: 2015-05-01 | Disposition: A | Discharge status: Home / Self Care | Payer: Commercial Managed Care - HMO | Source: Ambulatory Visit | Attending: Gastroenterology | Admitting: Gastroenterology

## 2015-05-01 DIAGNOSIS — M1990 Unspecified osteoarthritis, unspecified site: Secondary | ICD-10-CM | POA: Insufficient documentation

## 2015-05-01 DIAGNOSIS — K317 Polyp of stomach and duodenum: Secondary | ICD-10-CM | POA: Insufficient documentation

## 2015-05-01 DIAGNOSIS — Z79899 Other long term (current) drug therapy: Secondary | ICD-10-CM | POA: Diagnosis not present

## 2015-05-01 DIAGNOSIS — K2950 Unspecified chronic gastritis without bleeding: Secondary | ICD-10-CM | POA: Insufficient documentation

## 2015-05-01 DIAGNOSIS — I10 Essential (primary) hypertension: Secondary | ICD-10-CM | POA: Diagnosis not present

## 2015-05-01 DIAGNOSIS — L719 Rosacea, unspecified: Secondary | ICD-10-CM | POA: Diagnosis not present

## 2015-05-01 DIAGNOSIS — K760 Fatty (change of) liver, not elsewhere classified: Secondary | ICD-10-CM | POA: Insufficient documentation

## 2015-05-01 DIAGNOSIS — R1013 Epigastric pain: Secondary | ICD-10-CM | POA: Diagnosis present

## 2015-05-01 DIAGNOSIS — Z800 Family history of malignant neoplasm of digestive organs: Secondary | ICD-10-CM | POA: Insufficient documentation

## 2015-05-01 DIAGNOSIS — Z791 Long term (current) use of non-steroidal anti-inflammatories (NSAID): Secondary | ICD-10-CM | POA: Diagnosis not present

## 2015-05-01 DIAGNOSIS — K219 Gastro-esophageal reflux disease without esophagitis: Secondary | ICD-10-CM | POA: Insufficient documentation

## 2015-05-01 DIAGNOSIS — Z853 Personal history of malignant neoplasm of breast: Secondary | ICD-10-CM | POA: Diagnosis not present

## 2015-05-01 DIAGNOSIS — Z6841 Body Mass Index (BMI) 40.0 and over, adult: Secondary | ICD-10-CM | POA: Diagnosis not present

## 2015-05-01 DIAGNOSIS — E669 Obesity, unspecified: Secondary | ICD-10-CM | POA: Insufficient documentation

## 2015-05-01 HISTORY — PX: ESOPHAGOGASTRODUODENOSCOPY (EGD) WITH PROPOFOL: SHX5813

## 2015-05-01 HISTORY — DX: Headache: R51

## 2015-05-01 HISTORY — DX: Fatty (change of) liver, not elsewhere classified: K76.0

## 2015-05-01 HISTORY — DX: Unspecified osteoarthritis, unspecified site: M19.90

## 2015-05-01 HISTORY — DX: Headache, unspecified: R51.9

## 2015-05-01 SURGERY — ESOPHAGOGASTRODUODENOSCOPY (EGD) WITH PROPOFOL
Anesthesia: General

## 2015-05-01 MED ORDER — PROPOFOL 500 MG/50ML IV EMUL
INTRAVENOUS | Status: DC | PRN
  Administered 2015-05-01: 150 ug/kg/min via INTRAVENOUS

## 2015-05-01 MED ORDER — SODIUM CHLORIDE 0.9 % IV SOLN
INTRAVENOUS | Status: DC
  Administered 2015-05-01: 1000 mL via INTRAVENOUS

## 2015-05-01 MED ORDER — PROPOFOL 10 MG/ML IV BOLUS
INTRAVENOUS | Status: DC | PRN
  Administered 2015-05-01: 50 mg via INTRAVENOUS
  Administered 2015-05-01: 20 mg via INTRAVENOUS

## 2015-05-01 MED ORDER — SODIUM CHLORIDE 0.9 % IV SOLN: INTRAVENOUS | Status: DC

## 2015-05-01 NOTE — Transfer of Care (Signed)
Immediate Anesthesia Transfer of Care Note  Patient: Jacqueline Deleon  Procedure(s) Performed: Procedure(s): ESOPHAGOGASTRODUODENOSCOPY (EGD) WITH PROPOFOL (N/A)  Patient Location: PACU  Anesthesia Type:General  Level of Consciousness: awake and alert   Airway & Oxygen Therapy: Patient Spontanous Breathing  Post-op Assessment: Report given to RN  Post vital signs: Reviewed and stable  Last Vitals:  Filed Vitals:   05/01/15 1418  BP: 122/58  Pulse: 87  Temp: 36.3 C  Resp: 17    Complications: No apparent anesthesia complications

## 2015-05-01 NOTE — Anesthesia Procedure Notes (Signed)
Performed by: Johnna Acosta Pre-anesthesia Checklist: Patient identified, Emergency Drugs available and Suction available Patient Re-evaluated:Patient Re-evaluated prior to inductionOxygen Delivery Method: Nasal cannula

## 2015-05-01 NOTE — Anesthesia Postprocedure Evaluation (Signed)
  Anesthesia Post-op Note  Patient: Jacqueline Deleon  Procedure(s) Performed: Procedure(s): ESOPHAGOGASTRODUODENOSCOPY (EGD) WITH PROPOFOL (N/A)  Anesthesia type:General  Patient location: PACU  Post pain: Pain level controlled  Post assessment: Post-op Vital signs reviewed, Patient's Cardiovascular Status Stable, Respiratory Function Stable, Patent Airway and No signs of Nausea or vomiting  Post vital signs: Reviewed and stable  Last Vitals:  Filed Vitals:   05/01/15 1450  BP: 143/70  Pulse: 71  Temp:   Resp: 19    Level of consciousness: awake, alert  and patient cooperative  Complications: No apparent anesthesia complications

## 2015-05-01 NOTE — Op Note (Signed)
[facility] Patient Name: Jacqueline Deleon Procedure Date: 05/01/2015 2:07 PM MRN: [id number] Account #: [id number] Date of Birth: Mar 08, 1961 Admit Type: Outpatient Age: 54 Room: [facility] ENDO ROOM 4 Gender: Female Note Status: Finalized Procedure:         Upper GI endoscopy Indications:       Epigastric abdominal pain, Chronic ibuprofen use. Providers:         Lupita Dawn. Candace Cruise, MD Referring MD:      Caprice Renshaw (Referring MD) Medicines:         Monitored Anesthesia Care Complications:     No immediate complications. Procedure:         Pre-Anesthesia Assessment:                    - Prior to the procedure, a History and Physical was                     performed, and patient medications, allergies and                     sensitivities were reviewed. The patient's tolerance of                     previous anesthesia was reviewed.                    - The risks and benefits of the procedure and the sedation                     options and risks were discussed with the patient. All                     questions were answered and informed consent was obtained.                    - After reviewing the risks and benefits, the patient was                     deemed in satisfactory condition to undergo the procedure.                    After obtaining informed consent, the endoscope was passed                     under direct vision. Throughout the procedure, the                     patient's blood pressure, pulse, and oxygen saturations                     were monitored continuously. The Endoscope was introduced                     through the mouth, and advanced to the second part of                     duodenum. The upper GI endoscopy was accomplished without                     difficulty. The patient tolerated the procedure well. Findings:      The examined esophagus was normal.      Multiple small sessile polyps were found in the gastric fundus. Biopsies        were taken with a  cold forceps for histology.      The exam was otherwise without abnormality.      The examined duodenum was normal. Impression:        - Normal esophagus.                    - Multiple gastric polyps. Biopsied.                    - The examination was otherwise normal.                    - Normal examined duodenum. Recommendation:    - Discharge patient to home.                    - Observe patient's clinical course.                    - Await pathology results.                    - The findings and recommendations were discussed with the                     patient.                    - Prilosec daily x 1 month. Procedure Code(s): --- Professional ---                    [phone], Esophagogastroduodenoscopy, flexible, transoral;                     with biopsy, single or multiple Diagnosis Code(s): --- Professional ---                    K31.7, Polyp of stomach and duodenum                    R10.13, Epigastric pain CPT copyright 2014 American Medical Association. All rights reserved. The codes documented in this report are preliminary and upon coder review may  be revised to meet current compliance requirements. Hulen Luster, MD 05/01/2015 2:15:53 PM This report has been signed electronically. Number of Addenda: 0 Note Initiated On: 05/01/2015 2:07 PM      [facility]

## 2015-05-01 NOTE — H&P (Signed)
  Date of Initial H&P: 04/16/2015  History reviewed, patient examined, no change in status, stable for surgery.

## 2015-05-01 NOTE — Anesthesia Preprocedure Evaluation (Signed)
Anesthesia Evaluation  Patient identified by MRN, date of birth, ID band Patient awake    Reviewed: Allergy & Precautions, H&P , NPO status , Patient's Chart, lab work & pertinent test results  History of Anesthesia Complications Negative for: history of anesthetic complications  Airway Mallampati: II  TM Distance: >3 FB Neck ROM: full    Dental no notable dental hx. (+) Teeth Intact   Pulmonary neg pulmonary ROS, neg shortness of breath,    Pulmonary exam normal breath sounds clear to auscultation       Cardiovascular Exercise Tolerance: Good hypertension, (-) angina(-) Past MI and (-) DOE Normal cardiovascular exam Rhythm:regular Rate:Normal     Neuro/Psych  Headaches, negative psych ROS   GI/Hepatic negative GI ROS, Neg liver ROS,   Endo/Other  Morbid obesity  Renal/GU negative Renal ROS  negative genitourinary   Musculoskeletal  (+) Arthritis ,   Abdominal   Peds  Hematology negative hematology ROS (+)   Anesthesia Other Findings Past Medical History:   Hypertension                                                 Cancer ([place])                                                   Comment:breast   Arthritis                                                    Fatty liver                                                  Headache                                                       Comment:migraines  Past Surgical History:   FINGER SURGERY                                                  Comment:right index, Dr. Francesco Sor in [place]                       Comment:right upper back   [place]  Comment:supercervical, Dr. Rayford Halsted   BREAST SURGERY                                                  Comment:bilateral, Dr. Tamala Julian   MASTECTOMY PARTIAL / LUMPECTOMY                                  Comment:Dr. Tamala Julian, right breast   SKIN CANCER EXCISION                                            Comment:back and calves   CHOLECYSTECTOMY                                                 Comment:Dr. [place]                                                    Comment:Dr. Hyatt  BMI    Body Mass Index   47.17 kg/m 2    Signs and symptoms suggestive of sleep apnea    Reproductive/Obstetrics negative OB ROS                             Anesthesia Physical Anesthesia Plan  ASA: III  Anesthesia Plan: General   Post-op Pain Management:    Induction:   Airway Management Planned:   Additional Equipment:   Intra-op Plan:   Post-operative Plan:   Informed Consent: I have reviewed the patients History and Physical, chart, labs and discussed the procedure including the risks, benefits and alternatives for the proposed anesthesia with the patient or authorized representative who has indicated his/her understanding and acceptance.   Dental Advisory Given  Plan Discussed with: Anesthesiologist, CRNA and Surgeon  Anesthesia Plan Comments:         Anesthesia Quick Evaluation

## 2015-05-02 ENCOUNTER — Encounter: Payer: Self-pay | Admitting: Gastroenterology

## 2015-05-06 LAB — SURGICAL PATHOLOGY

## 2015-05-09 ENCOUNTER — Other Ambulatory Visit: Payer: Self-pay | Admitting: Certified Nurse Midwife

## 2015-05-09 DIAGNOSIS — Z853 Personal history of malignant neoplasm of breast: Secondary | ICD-10-CM

## 2015-06-09 ENCOUNTER — Other Ambulatory Visit: Payer: Self-pay | Admitting: Nurse Practitioner

## 2015-06-09 ENCOUNTER — Other Ambulatory Visit: Payer: Self-pay | Admitting: Certified Nurse Midwife

## 2015-06-09 ENCOUNTER — Ambulatory Visit
Admission: RE | Admit: 2015-06-09 | Discharge: 2015-06-09 | Disposition: A | Discharge status: Home / Self Care | Payer: Commercial Managed Care - HMO | Source: Ambulatory Visit | Attending: Certified Nurse Midwife | Admitting: Certified Nurse Midwife

## 2015-06-09 DIAGNOSIS — Z9889 Other specified postprocedural states: Secondary | ICD-10-CM | POA: Diagnosis not present

## 2015-06-09 DIAGNOSIS — Z853 Personal history of malignant neoplasm of breast: Secondary | ICD-10-CM | POA: Insufficient documentation

## 2015-06-09 DIAGNOSIS — K760 Fatty (change of) liver, not elsewhere classified: Secondary | ICD-10-CM

## 2015-08-14 ENCOUNTER — Ambulatory Visit
Admission: RE | Admit: 2015-08-14 | Discharge: 2015-08-14 | Disposition: A | Discharge status: Home / Self Care | Payer: Commercial Managed Care - HMO | Source: Ambulatory Visit | Attending: Nurse Practitioner | Admitting: Nurse Practitioner

## 2015-08-14 DIAGNOSIS — Z9049 Acquired absence of other specified parts of digestive tract: Secondary | ICD-10-CM | POA: Diagnosis not present

## 2015-08-14 DIAGNOSIS — K760 Fatty (change of) liver, not elsewhere classified: Secondary | ICD-10-CM | POA: Diagnosis present

## 2015-10-27 ENCOUNTER — Encounter: Payer: Self-pay | Admitting: Dietician

## 2015-10-27 ENCOUNTER — Encounter: Payer: Commercial Managed Care - HMO | Attending: Nurse Practitioner | Admitting: Dietician

## 2015-10-27 VITALS — Ht 62.0 in | Wt 253.3 lb

## 2015-10-27 DIAGNOSIS — K7460 Unspecified cirrhosis of liver: Secondary | ICD-10-CM

## 2015-10-27 DIAGNOSIS — E669 Obesity, unspecified: Secondary | ICD-10-CM | POA: Diagnosis present

## 2015-10-27 DIAGNOSIS — R739 Hyperglycemia, unspecified: Secondary | ICD-10-CM

## 2015-10-27 DIAGNOSIS — K7469 Other cirrhosis of liver: Secondary | ICD-10-CM | POA: Diagnosis not present

## 2015-10-27 NOTE — Patient Instructions (Signed)
   Keep working to build up physical activity, by trying short-duration sessions one or more times a day.   Plan to increase vegetable intake; include a vegetable or fruit or both with each meal. Goal is at least 5 servings of these foods.   Consider tracking food intake, exercise minutes, and allow yourself a reward, such as a pedicure, new earrings, etc.   check weight at least once a week to help promote better weight loss.

## 2015-10-27 NOTE — Progress Notes (Signed)
Medical Nutrition Therapy: Visit start time: [id number]  end time: 1700  Assessment:  Diagnosis: non-alcoholic cirrhosis, obesity Past medical history: pre-diabetes, HTN, GERD, arthritis Psychosocial issues/ stress concerns: patient reports high stress level, does some stress eating. Preferred learning method:  . Auditory . Visual  Current weight: 253.3lbs  Height: 5'2" Medications, supplements: reviewed list in chart with patient  Progress and evaluation: Patient reports eating less starchy foods such as potatoes, bread; she is also working on portion control.          She states she does crave sweets at times, but is trying to limit to prevent diabetes.         She voices frustration over minimal weight loss after making multiple diet and lifestyle changes.           Physical activity: walking 15 minutes 4 times per week. Unable to increase duration due to ankle issues.       Using step tracker and currently aiming for 5000steps daily.  Dietary Intake:  Usual eating pattern includes 3 meals and 2 snacks per day. Dining out frequency: unknown.  Breakfast: 6:30-7am honey wheat toast with cheese, or banana nut granola cereal, 1/2 cup per serving size Snack: 9:30-10 (hungry) granola bar peanut butter choc chip, or trail mix, dark choc nut Lunch: can soup or sandwich from home, or tuna pkg. Fruit cup.  Snack: same as am Supper: quick and easy; frozen foods/ meals; hamburger helper; meat and salad or veg.  Snack: usually none, no fluids late to avoid nighttime urination Beverages: water, milk at breakfast (has decreased), coffee at work sometimes with creamer, diet soda, decaf tea with Splenda  Nutrition Care Education: Topics covered: weight management for cirrhosis, pre-diabetes Basic nutrition: basic food groups, appropriate nutrient balance, appropriate meal and snack schedule, general nutrition guidelines    Weight control: benefits of weight control, determining reasonable weight goal:  at least 3-5% to prevent disease progression, ideally 10% or more.      Guide for 1300kcal daily intake with 45%CHO, 25%protein, 30%fat. Discussed high fiber food choices, and inclusion of low-carb vegetables.  Diabetes: appropriate meal and snack schedule, appropriate carb intake and balance Other lifestyle changes: importance of regular exercise and strategies to minimize pain from exercise.   Nutritional Diagnosis:  [place]-3.3 Overweight/obesity As related to history of excess calorie intake, inactivity, stress.  As evidenced by patient report.  Intervention: Instruction as noted above.   Set goals with patient input.   Encouraged her to discuss possible depression and stress management with MD as she is planning.    She did not wish to schedule follow-up at this time; she will rely on friends for support.   Encouraged her to call as needed with any questions or concerns, or to report progress.  Education Materials given:  . Food lists/ Planning A Balanced Meal . Sample meal pattern/ menus: Quick and Healthy Meal Ideas . Goals/ instructions  Learner/ who was taught:  . Patient   Level of understanding: Marland Kitchen Verbalizes/ demonstrates competency  Demonstrated degree of understanding via:   Teach back Learning barriers: . None  Willingness to learn/ readiness for change: . Eager, change in progress  Monitoring and Evaluation:  Dietary intake, exercise, and body weight      follow up: prn

## 2016-02-23 DIAGNOSIS — M546 Pain in thoracic spine: Secondary | ICD-10-CM | POA: Diagnosis not present

## 2016-02-23 DIAGNOSIS — M542 Cervicalgia: Secondary | ICD-10-CM | POA: Diagnosis not present

## 2016-02-23 DIAGNOSIS — R51 Headache: Secondary | ICD-10-CM | POA: Diagnosis not present

## 2016-02-23 DIAGNOSIS — M5387 Other specified dorsopathies, lumbosacral region: Secondary | ICD-10-CM | POA: Diagnosis not present

## 2016-02-23 DIAGNOSIS — M9902 Segmental and somatic dysfunction of thoracic region: Secondary | ICD-10-CM | POA: Diagnosis not present

## 2016-02-23 DIAGNOSIS — M545 Low back pain: Secondary | ICD-10-CM | POA: Diagnosis not present

## 2016-02-23 DIAGNOSIS — M9903 Segmental and somatic dysfunction of lumbar region: Secondary | ICD-10-CM | POA: Diagnosis not present

## 2016-02-23 DIAGNOSIS — M9901 Segmental and somatic dysfunction of cervical region: Secondary | ICD-10-CM | POA: Diagnosis not present

## 2016-02-23 DIAGNOSIS — M5412 Radiculopathy, cervical region: Secondary | ICD-10-CM | POA: Diagnosis not present

## 2016-02-24 DIAGNOSIS — L821 Other seborrheic keratosis: Secondary | ICD-10-CM | POA: Diagnosis not present

## 2016-02-24 DIAGNOSIS — L720 Epidermal cyst: Secondary | ICD-10-CM | POA: Diagnosis not present

## 2016-02-24 DIAGNOSIS — L814 Other melanin hyperpigmentation: Secondary | ICD-10-CM | POA: Diagnosis not present

## 2016-02-24 DIAGNOSIS — Z1283 Encounter for screening for malignant neoplasm of skin: Secondary | ICD-10-CM | POA: Diagnosis not present

## 2016-02-24 DIAGNOSIS — L905 Scar conditions and fibrosis of skin: Secondary | ICD-10-CM | POA: Diagnosis not present

## 2016-02-24 DIAGNOSIS — D485 Neoplasm of uncertain behavior of skin: Secondary | ICD-10-CM | POA: Diagnosis not present

## 2016-02-24 DIAGNOSIS — C44719 Basal cell carcinoma of skin of left lower limb, including hip: Secondary | ICD-10-CM | POA: Diagnosis not present

## 2016-02-24 DIAGNOSIS — Z85828 Personal history of other malignant neoplasm of skin: Secondary | ICD-10-CM | POA: Diagnosis not present

## 2016-02-24 DIAGNOSIS — C44799 Other specified malignant neoplasm of skin of left lower limb, including hip: Secondary | ICD-10-CM | POA: Diagnosis not present

## 2016-02-24 DIAGNOSIS — L718 Other rosacea: Secondary | ICD-10-CM | POA: Diagnosis not present

## 2016-02-24 DIAGNOSIS — D1800 Hemangioma unspecified site: Secondary | ICD-10-CM | POA: Diagnosis not present

## 2016-02-25 DIAGNOSIS — H35712 Central serous chorioretinopathy, left eye: Secondary | ICD-10-CM | POA: Diagnosis not present

## 2016-02-25 DIAGNOSIS — H2513 Age-related nuclear cataract, bilateral: Secondary | ICD-10-CM | POA: Diagnosis not present

## 2016-03-03 ENCOUNTER — Other Ambulatory Visit: Payer: Self-pay | Admitting: Nurse Practitioner

## 2016-03-03 DIAGNOSIS — K7460 Unspecified cirrhosis of liver: Secondary | ICD-10-CM

## 2016-03-03 DIAGNOSIS — R748 Abnormal levels of other serum enzymes: Secondary | ICD-10-CM

## 2016-03-03 DIAGNOSIS — K7581 Nonalcoholic steatohepatitis (NASH): Principal | ICD-10-CM

## 2016-03-03 DIAGNOSIS — F329 Major depressive disorder, single episode, unspecified: Secondary | ICD-10-CM | POA: Diagnosis not present

## 2016-03-04 DIAGNOSIS — R748 Abnormal levels of other serum enzymes: Secondary | ICD-10-CM | POA: Diagnosis not present

## 2016-03-04 DIAGNOSIS — K7460 Unspecified cirrhosis of liver: Secondary | ICD-10-CM | POA: Diagnosis not present

## 2016-03-04 DIAGNOSIS — K7581 Nonalcoholic steatohepatitis (NASH): Secondary | ICD-10-CM | POA: Diagnosis not present

## 2016-03-05 DIAGNOSIS — F419 Anxiety disorder, unspecified: Secondary | ICD-10-CM | POA: Diagnosis not present

## 2016-03-09 ENCOUNTER — Ambulatory Visit
Admission: RE | Admit: 2016-03-09 | Discharge: 2016-03-09 | Disposition: A | Discharge status: Home / Self Care | Payer: 59 | Source: Ambulatory Visit | Attending: Nurse Practitioner | Admitting: Nurse Practitioner

## 2016-03-09 DIAGNOSIS — K7581 Nonalcoholic steatohepatitis (NASH): Secondary | ICD-10-CM | POA: Insufficient documentation

## 2016-03-09 DIAGNOSIS — R748 Abnormal levels of other serum enzymes: Secondary | ICD-10-CM | POA: Diagnosis not present

## 2016-03-09 DIAGNOSIS — K7460 Unspecified cirrhosis of liver: Secondary | ICD-10-CM | POA: Diagnosis not present

## 2016-03-12 DIAGNOSIS — C4491 Basal cell carcinoma of skin, unspecified: Secondary | ICD-10-CM

## 2016-03-12 HISTORY — DX: Basal cell carcinoma of skin, unspecified: C44.91

## 2016-03-22 DIAGNOSIS — D239 Other benign neoplasm of skin, unspecified: Secondary | ICD-10-CM | POA: Diagnosis not present

## 2016-03-22 DIAGNOSIS — C44719 Basal cell carcinoma of skin of left lower limb, including hip: Secondary | ICD-10-CM | POA: Diagnosis not present

## 2016-03-22 DIAGNOSIS — Z85828 Personal history of other malignant neoplasm of skin: Secondary | ICD-10-CM | POA: Diagnosis not present

## 2016-04-12 DIAGNOSIS — M5387 Other specified dorsopathies, lumbosacral region: Secondary | ICD-10-CM | POA: Diagnosis not present

## 2016-04-12 DIAGNOSIS — R51 Headache: Secondary | ICD-10-CM | POA: Diagnosis not present

## 2016-04-12 DIAGNOSIS — M9903 Segmental and somatic dysfunction of lumbar region: Secondary | ICD-10-CM | POA: Diagnosis not present

## 2016-04-12 DIAGNOSIS — M9902 Segmental and somatic dysfunction of thoracic region: Secondary | ICD-10-CM | POA: Diagnosis not present

## 2016-04-12 DIAGNOSIS — M546 Pain in thoracic spine: Secondary | ICD-10-CM | POA: Diagnosis not present

## 2016-04-12 DIAGNOSIS — M5412 Radiculopathy, cervical region: Secondary | ICD-10-CM | POA: Diagnosis not present

## 2016-04-12 DIAGNOSIS — M542 Cervicalgia: Secondary | ICD-10-CM | POA: Diagnosis not present

## 2016-04-12 DIAGNOSIS — M9901 Segmental and somatic dysfunction of cervical region: Secondary | ICD-10-CM | POA: Diagnosis not present

## 2016-04-12 DIAGNOSIS — M545 Low back pain: Secondary | ICD-10-CM | POA: Diagnosis not present

## 2016-04-29 DIAGNOSIS — E119 Type 2 diabetes mellitus without complications: Secondary | ICD-10-CM | POA: Insufficient documentation

## 2016-04-29 DIAGNOSIS — Z0000 Encounter for general adult medical examination without abnormal findings: Secondary | ICD-10-CM | POA: Diagnosis not present

## 2016-04-29 DIAGNOSIS — F411 Generalized anxiety disorder: Secondary | ICD-10-CM | POA: Insufficient documentation

## 2016-04-29 DIAGNOSIS — R739 Hyperglycemia, unspecified: Secondary | ICD-10-CM | POA: Diagnosis not present

## 2016-04-29 DIAGNOSIS — Z23 Encounter for immunization: Secondary | ICD-10-CM | POA: Diagnosis not present

## 2016-04-29 DIAGNOSIS — Z79899 Other long term (current) drug therapy: Secondary | ICD-10-CM | POA: Diagnosis not present

## 2016-05-20 ENCOUNTER — Other Ambulatory Visit: Payer: Self-pay | Admitting: Certified Nurse Midwife

## 2016-05-20 DIAGNOSIS — Z1231 Encounter for screening mammogram for malignant neoplasm of breast: Secondary | ICD-10-CM

## 2016-06-14 DIAGNOSIS — M5412 Radiculopathy, cervical region: Secondary | ICD-10-CM | POA: Diagnosis not present

## 2016-06-14 DIAGNOSIS — M542 Cervicalgia: Secondary | ICD-10-CM | POA: Diagnosis not present

## 2016-06-14 DIAGNOSIS — M9901 Segmental and somatic dysfunction of cervical region: Secondary | ICD-10-CM | POA: Diagnosis not present

## 2016-06-14 DIAGNOSIS — M5387 Other specified dorsopathies, lumbosacral region: Secondary | ICD-10-CM | POA: Diagnosis not present

## 2016-06-14 DIAGNOSIS — M546 Pain in thoracic spine: Secondary | ICD-10-CM | POA: Diagnosis not present

## 2016-06-14 DIAGNOSIS — R51 Headache: Secondary | ICD-10-CM | POA: Diagnosis not present

## 2016-06-14 DIAGNOSIS — M9902 Segmental and somatic dysfunction of thoracic region: Secondary | ICD-10-CM | POA: Diagnosis not present

## 2016-06-14 DIAGNOSIS — M9903 Segmental and somatic dysfunction of lumbar region: Secondary | ICD-10-CM | POA: Diagnosis not present

## 2016-06-14 DIAGNOSIS — M545 Low back pain: Secondary | ICD-10-CM | POA: Diagnosis not present

## 2016-06-29 ENCOUNTER — Ambulatory Visit
Admission: RE | Admit: 2016-06-29 | Discharge: 2016-06-29 | Disposition: A | Discharge status: Home / Self Care | Payer: 59 | Source: Ambulatory Visit | Attending: Certified Nurse Midwife | Admitting: Certified Nurse Midwife

## 2016-06-29 DIAGNOSIS — Z1231 Encounter for screening mammogram for malignant neoplasm of breast: Secondary | ICD-10-CM | POA: Insufficient documentation

## 2016-07-01 DIAGNOSIS — Z124 Encounter for screening for malignant neoplasm of cervix: Secondary | ICD-10-CM | POA: Diagnosis not present

## 2016-07-01 DIAGNOSIS — Z13220 Encounter for screening for lipoid disorders: Secondary | ICD-10-CM | POA: Diagnosis not present

## 2016-07-01 DIAGNOSIS — Z853 Personal history of malignant neoplasm of breast: Secondary | ICD-10-CM | POA: Diagnosis not present

## 2016-07-01 DIAGNOSIS — Z01419 Encounter for gynecological examination (general) (routine) without abnormal findings: Secondary | ICD-10-CM | POA: Diagnosis not present

## 2016-07-12 DIAGNOSIS — E669 Obesity, unspecified: Secondary | ICD-10-CM

## 2016-07-12 HISTORY — DX: Obesity, unspecified: E66.9

## 2016-07-19 DIAGNOSIS — M5387 Other specified dorsopathies, lumbosacral region: Secondary | ICD-10-CM | POA: Diagnosis not present

## 2016-07-19 DIAGNOSIS — M545 Low back pain: Secondary | ICD-10-CM | POA: Diagnosis not present

## 2016-07-19 DIAGNOSIS — M9902 Segmental and somatic dysfunction of thoracic region: Secondary | ICD-10-CM | POA: Diagnosis not present

## 2016-07-19 DIAGNOSIS — M9903 Segmental and somatic dysfunction of lumbar region: Secondary | ICD-10-CM | POA: Diagnosis not present

## 2016-07-19 DIAGNOSIS — M5412 Radiculopathy, cervical region: Secondary | ICD-10-CM | POA: Diagnosis not present

## 2016-07-19 DIAGNOSIS — M542 Cervicalgia: Secondary | ICD-10-CM | POA: Diagnosis not present

## 2016-07-19 DIAGNOSIS — R51 Headache: Secondary | ICD-10-CM | POA: Diagnosis not present

## 2016-07-19 DIAGNOSIS — M546 Pain in thoracic spine: Secondary | ICD-10-CM | POA: Diagnosis not present

## 2016-07-19 DIAGNOSIS — M9901 Segmental and somatic dysfunction of cervical region: Secondary | ICD-10-CM | POA: Diagnosis not present

## 2016-08-23 DIAGNOSIS — M546 Pain in thoracic spine: Secondary | ICD-10-CM | POA: Diagnosis not present

## 2016-08-23 DIAGNOSIS — M9902 Segmental and somatic dysfunction of thoracic region: Secondary | ICD-10-CM | POA: Diagnosis not present

## 2016-08-23 DIAGNOSIS — M545 Low back pain: Secondary | ICD-10-CM | POA: Diagnosis not present

## 2016-08-23 DIAGNOSIS — M5412 Radiculopathy, cervical region: Secondary | ICD-10-CM | POA: Diagnosis not present

## 2016-08-23 DIAGNOSIS — M542 Cervicalgia: Secondary | ICD-10-CM | POA: Diagnosis not present

## 2016-08-23 DIAGNOSIS — R51 Headache: Secondary | ICD-10-CM | POA: Diagnosis not present

## 2016-08-23 DIAGNOSIS — M9901 Segmental and somatic dysfunction of cervical region: Secondary | ICD-10-CM | POA: Diagnosis not present

## 2016-08-23 DIAGNOSIS — M9903 Segmental and somatic dysfunction of lumbar region: Secondary | ICD-10-CM | POA: Diagnosis not present

## 2016-08-23 DIAGNOSIS — M5387 Other specified dorsopathies, lumbosacral region: Secondary | ICD-10-CM | POA: Diagnosis not present

## 2016-08-27 DIAGNOSIS — J029 Acute pharyngitis, unspecified: Secondary | ICD-10-CM | POA: Diagnosis not present

## 2016-09-01 ENCOUNTER — Other Ambulatory Visit: Payer: Self-pay | Admitting: Gastroenterology

## 2016-09-01 DIAGNOSIS — K7460 Unspecified cirrhosis of liver: Secondary | ICD-10-CM | POA: Diagnosis not present

## 2016-09-01 DIAGNOSIS — R7989 Other specified abnormal findings of blood chemistry: Secondary | ICD-10-CM | POA: Diagnosis not present

## 2016-09-03 DIAGNOSIS — H8110 Benign paroxysmal vertigo, unspecified ear: Secondary | ICD-10-CM | POA: Diagnosis not present

## 2016-09-14 ENCOUNTER — Ambulatory Visit
Admission: RE | Admit: 2016-09-14 | Discharge: 2016-09-14 | Disposition: A | Discharge status: Home / Self Care | Payer: 59 | Source: Ambulatory Visit | Attending: Gastroenterology | Admitting: Gastroenterology

## 2016-09-14 DIAGNOSIS — Z9049 Acquired absence of other specified parts of digestive tract: Secondary | ICD-10-CM | POA: Insufficient documentation

## 2016-09-14 DIAGNOSIS — K760 Fatty (change of) liver, not elsewhere classified: Secondary | ICD-10-CM | POA: Insufficient documentation

## 2016-09-14 DIAGNOSIS — K7460 Unspecified cirrhosis of liver: Secondary | ICD-10-CM | POA: Insufficient documentation

## 2016-09-27 DIAGNOSIS — M546 Pain in thoracic spine: Secondary | ICD-10-CM | POA: Diagnosis not present

## 2016-09-27 DIAGNOSIS — M9903 Segmental and somatic dysfunction of lumbar region: Secondary | ICD-10-CM | POA: Diagnosis not present

## 2016-09-27 DIAGNOSIS — M9902 Segmental and somatic dysfunction of thoracic region: Secondary | ICD-10-CM | POA: Diagnosis not present

## 2016-09-27 DIAGNOSIS — M9901 Segmental and somatic dysfunction of cervical region: Secondary | ICD-10-CM | POA: Diagnosis not present

## 2016-09-27 DIAGNOSIS — M5387 Other specified dorsopathies, lumbosacral region: Secondary | ICD-10-CM | POA: Diagnosis not present

## 2016-09-27 DIAGNOSIS — M5412 Radiculopathy, cervical region: Secondary | ICD-10-CM | POA: Diagnosis not present

## 2016-09-27 DIAGNOSIS — M542 Cervicalgia: Secondary | ICD-10-CM | POA: Diagnosis not present

## 2016-09-27 DIAGNOSIS — R51 Headache: Secondary | ICD-10-CM | POA: Diagnosis not present

## 2016-09-27 DIAGNOSIS — M545 Low back pain: Secondary | ICD-10-CM | POA: Diagnosis not present

## 2016-10-04 DIAGNOSIS — D239 Other benign neoplasm of skin, unspecified: Secondary | ICD-10-CM | POA: Diagnosis not present

## 2016-10-04 DIAGNOSIS — Z85828 Personal history of other malignant neoplasm of skin: Secondary | ICD-10-CM | POA: Diagnosis not present

## 2016-10-06 ENCOUNTER — Encounter: Payer: Self-pay | Admitting: Certified Nurse Midwife

## 2016-10-06 ENCOUNTER — Ambulatory Visit (INDEPENDENT_AMBULATORY_CARE_PROVIDER_SITE_OTHER): Payer: 59 | Admitting: Certified Nurse Midwife

## 2016-10-06 VITALS — BP 120/80 | HR 74 | Ht 62.0 in | Wt 252.0 lb

## 2016-10-06 DIAGNOSIS — R2231 Localized swelling, mass and lump, right upper limb: Secondary | ICD-10-CM | POA: Diagnosis not present

## 2016-10-06 DIAGNOSIS — Z853 Personal history of malignant neoplasm of breast: Secondary | ICD-10-CM | POA: Diagnosis not present

## 2016-10-06 NOTE — Progress Notes (Signed)
Chief Complaint: "I felt a lump under my left arm yesterday. It has been achy for a while."  HPI: Jacqueline Deleon is a 56 year old G1 P1 WF with history of a partial right mastectomy for infiltrating ductal carcinoma in 2009. She presents with the above chief complaint. Denies any skin lesions on her arm or chest area. Most recent mammogram was 06/29/2016 and was negative.    Past Medical History:  Diagnosis Date  . Arthritis   . 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 Central serous retinopathy   . Fatty liver   . GERD (gastroesophageal reflux disease)   . Gestational diabetes   . Headache    migraines  . Hypertension   . IBS (irritable bowel syndrome)   . Obesity 2018   BMI 45  . Rosacea    Past Surgical History:  Procedure Laterality Date  . BREAST BIOPSY Left 08/2006   benign fibroadenoma  . BREAST SURGERY Right    x2/ Dr Tamala Julian  . CARDIAC CATHETERIZATION  2006  . CESAREAN SECTION  04/02/1998  . CHOLECYSTECTOMY  04/2010   Dr. Smith/ laprascopic surgery  . COLONOSCOPY  04/04/2000  . ESOPHAGOGASTRODUODENOSCOPY (EGD) WITH PROPOFOL N/A 05/01/2015   Procedure: ESOPHAGOGASTRODUODENOSCOPY (EGD) WITH PROPOFOL;  Surgeon: Hulen Luster, MD;  Location: [facility] ENDOSCOPY;  Service: Gastroenterology;  Laterality: N/A;  . EYE SURGERY  08/2012   laser surgery on retina  . FINGER SURGERY     repair of tendon in right index, Dr. Francesco Sor in [place]  . FOOT SURGERY     Dr. Milinda Pointer  . IRRIGATION AND DEBRIDEMENT SEBACEOUS CYST     right upper back  . LAPAROSCOPIC SUPRACERVICAL HYSTERECTOMY  06/2007   Dr Rayford Halsted  . MASTECTOMY PARTIAL / LUMPECTOMY Right 01/2008   with sentinal lymph node biopsy/ infiltrating ductal cancer  . SKIN CANCER EXCISION     back and calves  . [place] EXTRACTION  1991   Social History   Social History  . Marital status: Married    Spouse name: N/A  . Number of children: N/A   . Years of education: N/A   Occupational History  . Not on file.   Social History Main Topics  . Smoking status: Never Smoker  . Smokeless tobacco: Never Used  . Alcohol use 0.0 oz/week     Comment: rare, 1-2 drinks per year  . Drug use: No  . Sexual activity: Not on file   Other Topics Concern  . Not on file   Social History Narrative   Lives in [place] with husband, no pets.  Works at [employer].      Diet - regular   Exercise - occasional   ROS: Constitutional : no fever Breast ROS: positive for lump in right axillary area; negative for inflammation, nipple discharge  Skin: no rash or inflammation  Physical exam: General: WF in NAD Vital signs: BP 120/80   Pulse 74   Ht 5\' 2"  (1.575 m)   Wt 114.3 kg (252 lb)   BMI 46.09 kg/m   Breast: 1 cm soft, oval NT mass palpated with patient sitting in anterior axillary line Right breast: skin changes due to hx of lumpectomy and radiation (no recent changes), no masses, NT Left breast: no skin changes, no masses, no nipple discharge  Skin: no boils or folliculitis in right axilla or right arm.  Lymph: no infraclavicular, supraclavicular or cervical LAN  A: right axillary mass-possible lymph node enlargement Hx of infiltrating ductal carcinoma of right breast  P: Referral to breast surgeon, Dr Nicholes Stairs for further evaluation.   Made an appt for 5 April at 14:30 Will try to schedule diagnostic mammogram and ultrasound on the right prior to that appt.  Dalia Heading, CNM

## 2016-10-13 ENCOUNTER — Ambulatory Visit
Admission: RE | Admit: 2016-10-13 | Discharge: 2016-10-13 | Disposition: A | Discharge status: Home / Self Care | Payer: 59 | Source: Ambulatory Visit | Attending: Certified Nurse Midwife | Admitting: Certified Nurse Midwife

## 2016-10-13 ENCOUNTER — Other Ambulatory Visit: Payer: Self-pay | Admitting: Certified Nurse Midwife

## 2016-10-13 DIAGNOSIS — Z853 Personal history of malignant neoplasm of breast: Secondary | ICD-10-CM | POA: Diagnosis not present

## 2016-10-13 DIAGNOSIS — R928 Other abnormal and inconclusive findings on diagnostic imaging of breast: Secondary | ICD-10-CM | POA: Diagnosis not present

## 2016-10-13 DIAGNOSIS — R229 Localized swelling, mass and lump, unspecified: Secondary | ICD-10-CM | POA: Diagnosis not present

## 2016-10-13 DIAGNOSIS — R2231 Localized swelling, mass and lump, right upper limb: Secondary | ICD-10-CM

## 2016-10-14 DIAGNOSIS — D1720 Benign lipomatous neoplasm of skin and subcutaneous tissue of unspecified limb: Secondary | ICD-10-CM | POA: Diagnosis not present

## 2016-10-14 DIAGNOSIS — Z853 Personal history of malignant neoplasm of breast: Secondary | ICD-10-CM | POA: Diagnosis not present

## 2016-10-26 ENCOUNTER — Other Ambulatory Visit: Payer: Self-pay | Admitting: Certified Nurse Midwife

## 2016-10-26 DIAGNOSIS — K760 Fatty (change of) liver, not elsewhere classified: Secondary | ICD-10-CM

## 2016-10-26 DIAGNOSIS — I10 Essential (primary) hypertension: Secondary | ICD-10-CM

## 2016-10-26 DIAGNOSIS — Z579 Occupational exposure to unspecified risk factor: Secondary | ICD-10-CM

## 2016-10-26 DIAGNOSIS — R7309 Other abnormal glucose: Secondary | ICD-10-CM

## 2016-10-27 ENCOUNTER — Other Ambulatory Visit: Payer: 59

## 2016-10-27 DIAGNOSIS — K760 Fatty (change of) liver, not elsewhere classified: Secondary | ICD-10-CM

## 2016-10-27 DIAGNOSIS — I10 Essential (primary) hypertension: Secondary | ICD-10-CM | POA: Diagnosis not present

## 2016-10-27 DIAGNOSIS — Z579 Occupational exposure to unspecified risk factor: Secondary | ICD-10-CM

## 2016-10-27 DIAGNOSIS — R7309 Other abnormal glucose: Secondary | ICD-10-CM

## 2016-10-28 LAB — COMPREHENSIVE METABOLIC PANEL
ALT: 15 IU/L (ref 0–32)
AST: 15 IU/L (ref 0–40)
Albumin/Globulin Ratio: 1.4 (ref 1.2–2.2)
Albumin: 4.2 g/dL (ref 3.5–5.5)
Alkaline Phosphatase: 129 IU/L — ABNORMAL HIGH (ref 39–117)
BILIRUBIN TOTAL: 0.3 mg/dL (ref 0.0–1.2)
BUN/Creatinine Ratio: 17 (ref 9–23)
BUN: 13 mg/dL (ref 6–24)
CHLORIDE: 103 mmol/L (ref 96–106)
CO2: 23 mmol/L (ref 18–29)
CREATININE: 0.78 mg/dL (ref 0.57–1.00)
Calcium: 9.1 mg/dL (ref 8.7–10.2)
GFR calc Af Amer: 100 mL/min/{1.73_m2} (ref >59)
GFR calc non Af Amer: 87 mL/min/{1.73_m2} (ref >59)
GLOBULIN, TOTAL: 2.9 g/dL (ref 1.5–4.5)
GLUCOSE: 91 mg/dL (ref 65–99)
Potassium: 4.5 mmol/L (ref 3.5–5.2)
SODIUM: 142 mmol/L (ref 134–144)
Total Protein: 7.1 g/dL (ref 6.0–8.5)

## 2016-10-28 LAB — CBC WITH DIFFERENTIAL/PLATELET
BASOS ABS: 0 10*3/uL (ref 0.0–0.2)
Basos: 0 %
EOS (ABSOLUTE): 0.2 10*3/uL (ref 0.0–0.4)
EOS: 2 %
HEMATOCRIT: 41.5 % (ref 34.0–46.6)
Hemoglobin: 13.1 g/dL (ref 11.1–15.9)
IMMATURE GRANULOCYTES: 0 %
Immature Grans (Abs): 0 10*3/uL (ref 0.0–0.1)
Lymphocytes Absolute: 1.8 10*3/uL (ref 0.7–3.1)
Lymphs: 24 %
MCH: 25.4 pg — ABNORMAL LOW (ref 26.6–33.0)
MCHC: 31.6 g/dL (ref 31.5–35.7)
MCV: 81 fL (ref 79–97)
MONOS ABS: 0.5 10*3/uL (ref 0.1–0.9)
Monocytes: 7 %
NEUTROS PCT: 67 %
Neutrophils Absolute: 5.1 10*3/uL (ref 1.4–7.0)
PLATELETS: 252 10*3/uL (ref 150–379)
RBC: 5.15 x10E6/uL (ref 3.77–5.28)
RDW: 16.3 % — ABNORMAL HIGH (ref 12.3–15.4)
WBC: 7.5 10*3/uL (ref 3.4–10.8)

## 2016-10-28 LAB — LIPID PANEL WITH LDL/HDL RATIO
CHOLESTEROL TOTAL: 178 mg/dL (ref 100–199)
HDL: 51 mg/dL (ref >39)
LDL CALC: 103 mg/dL — AB (ref 0–99)
LDl/HDL Ratio: 2 ratio (ref 0.0–3.2)
TRIGLYCERIDES: 118 mg/dL (ref 0–149)
VLDL Cholesterol Cal: 24 mg/dL (ref 5–40)

## 2016-10-28 LAB — HEMOGLOBIN A1C
ESTIMATED AVERAGE GLUCOSE: 117 mg/dL
Hgb A1c MFr Bld: 5.7 % — ABNORMAL HIGH (ref 4.8–5.6)

## 2016-10-28 LAB — HIV ANTIBODY (ROUTINE TESTING W REFLEX): HIV SCREEN 4TH GENERATION: NONREACTIVE

## 2016-10-28 LAB — TSH: TSH: 3.18 u[IU]/mL (ref 0.450–4.500)

## 2016-11-01 DIAGNOSIS — E119 Type 2 diabetes mellitus without complications: Secondary | ICD-10-CM | POA: Diagnosis not present

## 2016-11-01 DIAGNOSIS — F411 Generalized anxiety disorder: Secondary | ICD-10-CM | POA: Diagnosis not present

## 2016-11-01 DIAGNOSIS — I10 Essential (primary) hypertension: Secondary | ICD-10-CM | POA: Diagnosis not present

## 2016-11-01 DIAGNOSIS — Z79899 Other long term (current) drug therapy: Secondary | ICD-10-CM | POA: Diagnosis not present

## 2016-11-29 DIAGNOSIS — M9901 Segmental and somatic dysfunction of cervical region: Secondary | ICD-10-CM | POA: Diagnosis not present

## 2016-11-29 DIAGNOSIS — M546 Pain in thoracic spine: Secondary | ICD-10-CM | POA: Diagnosis not present

## 2016-11-29 DIAGNOSIS — M5387 Other specified dorsopathies, lumbosacral region: Secondary | ICD-10-CM | POA: Diagnosis not present

## 2016-11-29 DIAGNOSIS — M5412 Radiculopathy, cervical region: Secondary | ICD-10-CM | POA: Diagnosis not present

## 2016-11-29 DIAGNOSIS — M542 Cervicalgia: Secondary | ICD-10-CM | POA: Diagnosis not present

## 2016-11-29 DIAGNOSIS — R51 Headache: Secondary | ICD-10-CM | POA: Diagnosis not present

## 2016-11-29 DIAGNOSIS — M9903 Segmental and somatic dysfunction of lumbar region: Secondary | ICD-10-CM | POA: Diagnosis not present

## 2016-11-29 DIAGNOSIS — M545 Low back pain: Secondary | ICD-10-CM | POA: Diagnosis not present

## 2016-11-29 DIAGNOSIS — M9902 Segmental and somatic dysfunction of thoracic region: Secondary | ICD-10-CM | POA: Diagnosis not present

## 2016-12-22 DIAGNOSIS — I10 Essential (primary) hypertension: Secondary | ICD-10-CM | POA: Diagnosis not present

## 2016-12-22 DIAGNOSIS — E119 Type 2 diabetes mellitus without complications: Secondary | ICD-10-CM | POA: Diagnosis not present

## 2016-12-22 DIAGNOSIS — R0789 Other chest pain: Secondary | ICD-10-CM | POA: Diagnosis not present

## 2016-12-22 DIAGNOSIS — R0609 Other forms of dyspnea: Secondary | ICD-10-CM | POA: Insufficient documentation

## 2016-12-30 DIAGNOSIS — M542 Cervicalgia: Secondary | ICD-10-CM | POA: Diagnosis not present

## 2016-12-30 DIAGNOSIS — M9901 Segmental and somatic dysfunction of cervical region: Secondary | ICD-10-CM | POA: Diagnosis not present

## 2016-12-30 DIAGNOSIS — R51 Headache: Secondary | ICD-10-CM | POA: Diagnosis not present

## 2016-12-30 DIAGNOSIS — M9903 Segmental and somatic dysfunction of lumbar region: Secondary | ICD-10-CM | POA: Diagnosis not present

## 2016-12-30 DIAGNOSIS — M9902 Segmental and somatic dysfunction of thoracic region: Secondary | ICD-10-CM | POA: Diagnosis not present

## 2016-12-30 DIAGNOSIS — M5412 Radiculopathy, cervical region: Secondary | ICD-10-CM | POA: Diagnosis not present

## 2016-12-30 DIAGNOSIS — M545 Low back pain: Secondary | ICD-10-CM | POA: Diagnosis not present

## 2016-12-30 DIAGNOSIS — M5387 Other specified dorsopathies, lumbosacral region: Secondary | ICD-10-CM | POA: Diagnosis not present

## 2016-12-30 DIAGNOSIS — M546 Pain in thoracic spine: Secondary | ICD-10-CM | POA: Diagnosis not present

## 2017-01-19 DIAGNOSIS — Z853 Personal history of malignant neoplasm of breast: Secondary | ICD-10-CM | POA: Diagnosis not present

## 2017-01-24 DIAGNOSIS — E782 Mixed hyperlipidemia: Secondary | ICD-10-CM | POA: Diagnosis not present

## 2017-01-24 DIAGNOSIS — R0609 Other forms of dyspnea: Secondary | ICD-10-CM | POA: Diagnosis not present

## 2017-01-24 DIAGNOSIS — R0789 Other chest pain: Secondary | ICD-10-CM | POA: Diagnosis not present

## 2017-01-24 DIAGNOSIS — I10 Essential (primary) hypertension: Secondary | ICD-10-CM | POA: Diagnosis not present

## 2017-01-27 DIAGNOSIS — M546 Pain in thoracic spine: Secondary | ICD-10-CM | POA: Diagnosis not present

## 2017-01-27 DIAGNOSIS — M5387 Other specified dorsopathies, lumbosacral region: Secondary | ICD-10-CM | POA: Diagnosis not present

## 2017-01-27 DIAGNOSIS — M545 Low back pain: Secondary | ICD-10-CM | POA: Diagnosis not present

## 2017-01-27 DIAGNOSIS — M9902 Segmental and somatic dysfunction of thoracic region: Secondary | ICD-10-CM | POA: Diagnosis not present

## 2017-01-27 DIAGNOSIS — M9901 Segmental and somatic dysfunction of cervical region: Secondary | ICD-10-CM | POA: Diagnosis not present

## 2017-01-27 DIAGNOSIS — R51 Headache: Secondary | ICD-10-CM | POA: Diagnosis not present

## 2017-01-27 DIAGNOSIS — M542 Cervicalgia: Secondary | ICD-10-CM | POA: Diagnosis not present

## 2017-01-27 DIAGNOSIS — M9903 Segmental and somatic dysfunction of lumbar region: Secondary | ICD-10-CM | POA: Diagnosis not present

## 2017-01-27 DIAGNOSIS — M5412 Radiculopathy, cervical region: Secondary | ICD-10-CM | POA: Diagnosis not present

## 2017-02-24 ENCOUNTER — Other Ambulatory Visit: Payer: Self-pay | Admitting: Gastroenterology

## 2017-02-24 DIAGNOSIS — K7460 Unspecified cirrhosis of liver: Secondary | ICD-10-CM | POA: Diagnosis not present

## 2017-02-24 DIAGNOSIS — K760 Fatty (change of) liver, not elsewhere classified: Secondary | ICD-10-CM

## 2017-03-04 ENCOUNTER — Encounter: Payer: Self-pay | Admitting: Certified Nurse Midwife

## 2017-03-04 ENCOUNTER — Ambulatory Visit (INDEPENDENT_AMBULATORY_CARE_PROVIDER_SITE_OTHER): Payer: 59 | Admitting: Certified Nurse Midwife

## 2017-03-04 VITALS — BP 120/80 | HR 62 | Ht 62.0 in | Wt 262.5 lb

## 2017-03-04 DIAGNOSIS — N649 Disorder of breast, unspecified: Secondary | ICD-10-CM | POA: Diagnosis not present

## 2017-03-04 DIAGNOSIS — Z853 Personal history of malignant neoplasm of breast: Secondary | ICD-10-CM | POA: Diagnosis not present

## 2017-03-04 DIAGNOSIS — K760 Fatty (change of) liver, not elsewhere classified: Secondary | ICD-10-CM | POA: Diagnosis not present

## 2017-03-04 NOTE — Progress Notes (Signed)
Obstetrics & Gynecology Office Visit   Chief Complaint:  Chief Complaint  Patient presents with  . Breast Problem    breast exam    Chief Complaint: " My right nipple looks different."   HPI: Jacqueline Deleon is a 56 year old G1 P1 WF with history of a partial right mastectomy for infiltrating ductal carcinoma in 2009. She presents with complaints of a change in the appearance of her right nipple. First noticed 2 weeks ago. There appeared to be a flap of tissue on the upper part of the nipple. The nipple is nontender. Has not noticed any drainage from the nipple. Most recent mammogram on the right breast was 10/13/2016 and was a diagnostic mammogram done for a small axillary mass. The mammogram was negative. She was examined by Dr Tamala Julian in April and in July and the small mass was thought to be either a lipoma or some changes related to her surgery.   Review of Systems:  ROS -see HPI  Past Medical History:  Past Medical History:  Diagnosis Date  . Arthritis   . Breast cancer ([place])    2009 infiltrating ductal cancer of right breast  . 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 Central serous retinopathy   . Fatty liver   . GERD (gastroesophageal reflux disease)   . Gestational diabetes   . Headache    migraines  . Hypertension   . IBS (irritable bowel syndrome)   . Obesity 2018   BMI 45  . Rosacea     Past Surgical History:  Past Surgical History:  Procedure Laterality Date  . BREAST BIOPSY Left 08/2006   benign fibroadenoma  . BREAST SURGERY Right    x2/ Dr Tamala Julian  . CARDIAC CATHETERIZATION  2006  . CESAREAN SECTION  04/02/1998  . CHOLECYSTECTOMY  04/2010   Dr. Smith/ laprascopic surgery  . COLONOSCOPY  04/04/2000  . ESOPHAGOGASTRODUODENOSCOPY (EGD) WITH PROPOFOL N/A 05/01/2015   Procedure: ESOPHAGOGASTRODUODENOSCOPY (EGD) WITH PROPOFOL;  Surgeon: Hulen Luster, MD;  Location: [facility] ENDOSCOPY;  Service: Gastroenterology;  Laterality: N/A;  . EYE SURGERY  08/2012   laser surgery on retina  . FINGER SURGERY     repair of tendon in right index, Dr. Francesco Sor in [place]  . FOOT SURGERY     Dr. Milinda Pointer  . IRRIGATION AND DEBRIDEMENT SEBACEOUS CYST     right upper back  . LAPAROSCOPIC SUPRACERVICAL HYSTERECTOMY  06/2007   Dr Rayford Halsted  . MASTECTOMY PARTIAL / LUMPECTOMY Right 01/2008   with sentinal lymph node biopsy/ infiltrating ductal cancer  . SKIN CANCER EXCISION     back and calves  . [place] EXTRACTION  1991    Gynecologic History: No LMP recorded. Patient has had a hysterectomy.  Obstetric History: G1P0101  Family History:  Family History  Problem Relation Age of Onset  . Hypertension Mother   . Thyroid disease Mother   . Cancer Mother        colon and breast  . Breast cancer Mother 39  . Stroke Father   . Hypertension Father   . Cancer Father        prostate  . Hypertension Sister   . Thyroid disease Sister   . Cancer Sister        skin  . Lung cancer Paternal Aunt   . Diabetes Maternal Grandmother   . Stroke Maternal Grandfather   . Heart disease Paternal Grandfather   . Diabetes Maternal Aunt   .  Lymphoma Maternal Uncle     Social History:  Social History   Social History  . Marital status: Married    Spouse name: N/A  . Number of children: 1  . Years of education: N/A   Occupational History  . Not on file.   Social History Main Topics  . Smoking status: Never Smoker  . Smokeless tobacco: Never Used  . Alcohol use 0.0 oz/week     Comment: rare, 1-2 drinks per year  . Drug use: No  . Sexual activity: Not on file   Other Topics Concern  . Not on file   Social History Narrative   Lives in [place] with husband, no pets.  Works at [employer].      Diet - regular   Exercise - occasional    Allergies:  Allergies  Allergen Reactions  . Kiwi Extract Itching    swelling swelling  . Other Swelling    Grapes--itching  . Adhesive [Tape]   . Amoxicillin   . Codeine   . Erythromycin   . Sulfa  Antibiotics Rash    Medications: Prior to Admission medications   Medication Sig Start Date End Date Taking? Authorizing Provider  acetaminophen (TYLENOL) 325 MG tablet Take 650 mg by mouth every 6 (six) hours as needed.   Yes [provider]  amLODipine (NORVASC) 10 MG tablet Take 0.5 tablets (5 mg total) by mouth daily. 04/23/13  Yes Jackolyn Confer, MD  Ascorbic Acid (VITAMIN C) 1000 MG tablet Take by mouth.   Yes [provider]  Camphor-Eucalyptus-Menthol (VICKS VAPORUB) 4.7-1.2-2.6 % OINT    Yes [provider]  citalopram (CELEXA) 40 MG tablet  02/08/17  Yes [provider]  clotrimazole (CLOTRIMAZOLE ANTI-FUNGAL) 1 % cream Apply topically.   Yes [provider]  diphenhydrAMINE (BENADRYL) 25 MG tablet Take by mouth.   Yes [provider]  doxepin (SINEQUAN) 100 MG capsule Take 100 mg by mouth 3 (three) times daily.   Yes [provider]  ibuprofen (ADVIL,MOTRIN) 200 MG tablet Take 200 mg by mouth every 6 (six) hours as needed.   Yes [provider]  ketotifen (ZADITOR) 0.025 % ophthalmic solution Apply to eye.   Yes [provider]  lisinopril (PRINIVIL,ZESTRIL) 10 MG tablet Take 1 tablet by mouth  daily 02/19/14  Yes Jackolyn Confer, MD  loratadine (CLARITIN) 10 MG tablet Take by mouth.   Yes [provider]  meclizine (ANTIVERT) 25 MG tablet  09/03/16  Yes [provider]  meloxicam (MOBIC) 15 MG tablet Take 1 tablet (15 mg total) by mouth daily. 06/05/13  Yes Hyatt, Max T, DPM  metFORMIN (GLUCOPHAGE-XR) 500 MG 24 hr tablet  09/15/15  Yes [provider]  metroNIDAZOLE (METROGEL) 0.75 % gel  06/24/15  Yes [provider]  minocycline (MINOCIN,DYNACIN) 100 MG capsule Take 100 mg by mouth 2 (two) times daily.   Yes [provider]  Multiple Vitamins-Minerals (MULTIVITAMIN & MINERAL PO) Take by mouth.   Yes [provider]  nitroGLYCERIN (NITROSTAT)  0.4 MG SL tablet Place under the tongue. 12/22/16 12/22/17 Yes [provider]  omeprazole (PRILOSEC) 20 MG capsule Take 20 mg by mouth daily.   Yes [provider]         triamcinolone cream (KENALOG) 0.5 % Apply 1 application topically 3 (three) times daily.   Yes [provider]    Physical Exam Vitals: BP 120/80   Pulse 62   Ht 5\' 2"  (1.575  m)   Wt 262 lb 8 oz (119.1 kg)   BMI 48.01 kg/m  Physical Exam  Constitutional: She appears well-developed and well-nourished. No distress.  Respiratory:  Right Breast: tiny papular non tender lesion on her right upper nipple that is red in color. No masses in right breast and no nipple discharge. Skin changes on right lower outer b.reast s/p mastectomy     Assessment: 56 y.o. G1P0101 right nipple lesion ? Etiology. Most likely a benign lesion  Plan: Will refer to Dr Tamala Julian for evaluation  IN addition labs that were requested by GI were ordered.   Dalia Heading, CNM

## 2017-03-06 ENCOUNTER — Encounter: Payer: Self-pay | Admitting: Certified Nurse Midwife

## 2017-03-06 DIAGNOSIS — K589 Irritable bowel syndrome without diarrhea: Secondary | ICD-10-CM | POA: Insufficient documentation

## 2017-03-06 DIAGNOSIS — K219 Gastro-esophageal reflux disease without esophagitis: Secondary | ICD-10-CM | POA: Insufficient documentation

## 2017-03-06 DIAGNOSIS — K760 Fatty (change of) liver, not elsewhere classified: Secondary | ICD-10-CM | POA: Insufficient documentation

## 2017-03-06 DIAGNOSIS — H35719 Central serous chorioretinopathy, unspecified eye: Secondary | ICD-10-CM | POA: Insufficient documentation

## 2017-03-06 DIAGNOSIS — L719 Rosacea, unspecified: Secondary | ICD-10-CM | POA: Insufficient documentation

## 2017-03-06 NOTE — Addendum Note (Signed)
Addended by: Dalia Heading on: 03/06/2017 05:45 PM   Modules accepted: Orders

## 2017-03-07 DIAGNOSIS — L720 Epidermal cyst: Secondary | ICD-10-CM | POA: Diagnosis not present

## 2017-03-07 DIAGNOSIS — D239 Other benign neoplasm of skin, unspecified: Secondary | ICD-10-CM | POA: Diagnosis not present

## 2017-03-07 DIAGNOSIS — L820 Inflamed seborrheic keratosis: Secondary | ICD-10-CM | POA: Diagnosis not present

## 2017-03-07 DIAGNOSIS — L718 Other rosacea: Secondary | ICD-10-CM | POA: Diagnosis not present

## 2017-03-07 DIAGNOSIS — Z1283 Encounter for screening for malignant neoplasm of skin: Secondary | ICD-10-CM | POA: Diagnosis not present

## 2017-03-07 DIAGNOSIS — L2489 Irritant contact dermatitis due to other agents: Secondary | ICD-10-CM | POA: Diagnosis not present

## 2017-03-07 DIAGNOSIS — D1800 Hemangioma unspecified site: Secondary | ICD-10-CM | POA: Diagnosis not present

## 2017-03-07 DIAGNOSIS — Z85828 Personal history of other malignant neoplasm of skin: Secondary | ICD-10-CM | POA: Diagnosis not present

## 2017-03-07 DIAGNOSIS — D485 Neoplasm of uncertain behavior of skin: Secondary | ICD-10-CM | POA: Diagnosis not present

## 2017-03-08 ENCOUNTER — Ambulatory Visit
Admission: RE | Admit: 2017-03-08 | Discharge: 2017-03-08 | Disposition: A | Discharge status: Home / Self Care | Payer: 59 | Source: Ambulatory Visit | Attending: Gastroenterology | Admitting: Gastroenterology

## 2017-03-08 ENCOUNTER — Other Ambulatory Visit: Payer: 59

## 2017-03-08 DIAGNOSIS — K7460 Unspecified cirrhosis of liver: Secondary | ICD-10-CM | POA: Insufficient documentation

## 2017-03-08 DIAGNOSIS — K760 Fatty (change of) liver, not elsewhere classified: Secondary | ICD-10-CM | POA: Diagnosis not present

## 2017-03-09 DIAGNOSIS — M5387 Other specified dorsopathies, lumbosacral region: Secondary | ICD-10-CM | POA: Diagnosis not present

## 2017-03-09 DIAGNOSIS — M546 Pain in thoracic spine: Secondary | ICD-10-CM | POA: Diagnosis not present

## 2017-03-09 DIAGNOSIS — M542 Cervicalgia: Secondary | ICD-10-CM | POA: Diagnosis not present

## 2017-03-09 DIAGNOSIS — R51 Headache: Secondary | ICD-10-CM | POA: Diagnosis not present

## 2017-03-09 DIAGNOSIS — M5412 Radiculopathy, cervical region: Secondary | ICD-10-CM | POA: Diagnosis not present

## 2017-03-09 DIAGNOSIS — M9902 Segmental and somatic dysfunction of thoracic region: Secondary | ICD-10-CM | POA: Diagnosis not present

## 2017-03-09 DIAGNOSIS — M9901 Segmental and somatic dysfunction of cervical region: Secondary | ICD-10-CM | POA: Diagnosis not present

## 2017-03-09 DIAGNOSIS — M9903 Segmental and somatic dysfunction of lumbar region: Secondary | ICD-10-CM | POA: Diagnosis not present

## 2017-03-09 DIAGNOSIS — M545 Low back pain: Secondary | ICD-10-CM | POA: Diagnosis not present

## 2017-03-09 LAB — HEPATIC FUNCTION PANEL
ALK PHOS: 127 IU/L — AB (ref 39–117)
ALT: 12 IU/L (ref 0–32)
AST: 14 IU/L (ref 0–40)
Albumin: 4.2 g/dL (ref 3.5–5.5)
BILIRUBIN, DIRECT: 0.11 mg/dL (ref 0.00–0.40)
Bilirubin Total: 0.3 mg/dL (ref 0.0–1.2)
Total Protein: 6.8 g/dL (ref 6.0–8.5)

## 2017-03-09 LAB — AFP TUMOR MARKER: AFP, Serum, Tumor Marker: 3 ng/mL (ref 0.0–8.3)

## 2017-03-10 DIAGNOSIS — N649 Disorder of breast, unspecified: Secondary | ICD-10-CM | POA: Diagnosis not present

## 2017-03-23 DIAGNOSIS — N649 Disorder of breast, unspecified: Secondary | ICD-10-CM | POA: Diagnosis not present

## 2017-03-23 DIAGNOSIS — L905 Scar conditions and fibrosis of skin: Secondary | ICD-10-CM | POA: Diagnosis not present

## 2017-04-06 DIAGNOSIS — M9901 Segmental and somatic dysfunction of cervical region: Secondary | ICD-10-CM | POA: Diagnosis not present

## 2017-04-06 DIAGNOSIS — M545 Low back pain: Secondary | ICD-10-CM | POA: Diagnosis not present

## 2017-04-06 DIAGNOSIS — M9902 Segmental and somatic dysfunction of thoracic region: Secondary | ICD-10-CM | POA: Diagnosis not present

## 2017-04-06 DIAGNOSIS — R51 Headache: Secondary | ICD-10-CM | POA: Diagnosis not present

## 2017-04-06 DIAGNOSIS — M5387 Other specified dorsopathies, lumbosacral region: Secondary | ICD-10-CM | POA: Diagnosis not present

## 2017-04-06 DIAGNOSIS — M9903 Segmental and somatic dysfunction of lumbar region: Secondary | ICD-10-CM | POA: Diagnosis not present

## 2017-04-06 DIAGNOSIS — M542 Cervicalgia: Secondary | ICD-10-CM | POA: Diagnosis not present

## 2017-04-06 DIAGNOSIS — M5412 Radiculopathy, cervical region: Secondary | ICD-10-CM | POA: Diagnosis not present

## 2017-04-06 DIAGNOSIS — M546 Pain in thoracic spine: Secondary | ICD-10-CM | POA: Diagnosis not present

## 2017-04-27 ENCOUNTER — Other Ambulatory Visit: Payer: Self-pay | Admitting: Certified Nurse Midwife

## 2017-04-27 DIAGNOSIS — Z579 Occupational exposure to unspecified risk factor: Secondary | ICD-10-CM

## 2017-04-27 DIAGNOSIS — Z79899 Other long term (current) drug therapy: Principal | ICD-10-CM

## 2017-04-27 DIAGNOSIS — Z5181 Encounter for therapeutic drug level monitoring: Secondary | ICD-10-CM

## 2017-04-27 DIAGNOSIS — E119 Type 2 diabetes mellitus without complications: Secondary | ICD-10-CM

## 2017-04-27 DIAGNOSIS — E7800 Pure hypercholesterolemia, unspecified: Secondary | ICD-10-CM

## 2017-04-28 ENCOUNTER — Other Ambulatory Visit: Payer: 59

## 2017-04-28 DIAGNOSIS — E7800 Pure hypercholesterolemia, unspecified: Secondary | ICD-10-CM

## 2017-04-28 DIAGNOSIS — Z79899 Other long term (current) drug therapy: Principal | ICD-10-CM

## 2017-04-28 DIAGNOSIS — E119 Type 2 diabetes mellitus without complications: Secondary | ICD-10-CM | POA: Diagnosis not present

## 2017-04-28 DIAGNOSIS — Z5181 Encounter for therapeutic drug level monitoring: Secondary | ICD-10-CM

## 2017-04-28 DIAGNOSIS — Z579 Occupational exposure to unspecified risk factor: Secondary | ICD-10-CM

## 2017-04-29 LAB — COMPREHENSIVE METABOLIC PANEL
ALBUMIN: 4.3 g/dL (ref 3.5–5.5)
ALK PHOS: 128 IU/L — AB (ref 39–117)
ALT: 17 IU/L (ref 0–32)
AST: 16 IU/L (ref 0–40)
Albumin/Globulin Ratio: 1.7 (ref 1.2–2.2)
BILIRUBIN TOTAL: 0.3 mg/dL (ref 0.0–1.2)
BUN / CREAT RATIO: 19 (ref 9–23)
BUN: 15 mg/dL (ref 6–24)
CHLORIDE: 102 mmol/L (ref 96–106)
CO2: 21 mmol/L (ref 20–29)
Calcium: 9.2 mg/dL (ref 8.7–10.2)
Creatinine, Ser: 0.8 mg/dL (ref 0.57–1.00)
GFR calc Af Amer: 97 mL/min/{1.73_m2} (ref >59)
GFR calc non Af Amer: 84 mL/min/{1.73_m2} (ref >59)
GLUCOSE: 92 mg/dL (ref 65–99)
Globulin, Total: 2.5 g/dL (ref 1.5–4.5)
Potassium: 4.4 mmol/L (ref 3.5–5.2)
SODIUM: 141 mmol/L (ref 134–144)
Total Protein: 6.8 g/dL (ref 6.0–8.5)

## 2017-04-29 LAB — CBC WITH DIFFERENTIAL/PLATELET
Basophils Absolute: 0 10*3/uL (ref 0.0–0.2)
Basos: 0 %
EOS (ABSOLUTE): 0.2 10*3/uL (ref 0.0–0.4)
Eos: 2 %
Hematocrit: 40.4 % (ref 34.0–46.6)
Hemoglobin: 12.5 g/dL (ref 11.1–15.9)
Immature Grans (Abs): 0 10*3/uL (ref 0.0–0.1)
Immature Granulocytes: 0 %
Lymphocytes Absolute: 1.5 10*3/uL (ref 0.7–3.1)
Lymphs: 18 %
MCH: 25.3 pg — ABNORMAL LOW (ref 26.6–33.0)
MCHC: 30.9 g/dL — ABNORMAL LOW (ref 31.5–35.7)
MCV: 82 fL (ref 79–97)
Monocytes Absolute: 0.7 10*3/uL (ref 0.1–0.9)
Monocytes: 9 %
Neutrophils Absolute: 5.7 10*3/uL (ref 1.4–7.0)
Neutrophils: 71 %
Platelets: 237 10*3/uL (ref 150–379)
RBC: 4.94 x10E6/uL (ref 3.77–5.28)
RDW: 16.3 % — ABNORMAL HIGH (ref 12.3–15.4)
WBC: 8.1 10*3/uL (ref 3.4–10.8)

## 2017-04-29 LAB — LIPID PANEL
Chol/HDL Ratio: 3.2 ratio (ref 0.0–4.4)
Cholesterol, Total: 173 mg/dL (ref 100–199)
HDL: 54 mg/dL (ref >39)
LDL Calculated: 90 mg/dL (ref 0–99)
Triglycerides: 145 mg/dL (ref 0–149)
VLDL Cholesterol Cal: 29 mg/dL (ref 5–40)

## 2017-04-29 LAB — TSH: TSH: 4.39 u[IU]/mL (ref 0.450–4.500)

## 2017-04-29 LAB — HEMOGLOBIN A1C
Est. average glucose Bld gHb Est-mCnc: 126 mg/dL
Hgb A1c MFr Bld: 6 % — ABNORMAL HIGH (ref 4.8–5.6)

## 2017-04-29 LAB — HIV ANTIBODY (ROUTINE TESTING W REFLEX): HIV SCREEN 4TH GENERATION: NONREACTIVE

## 2017-05-03 DIAGNOSIS — E119 Type 2 diabetes mellitus without complications: Secondary | ICD-10-CM | POA: Diagnosis not present

## 2017-05-03 DIAGNOSIS — I10 Essential (primary) hypertension: Secondary | ICD-10-CM | POA: Diagnosis not present

## 2017-05-03 DIAGNOSIS — Z79899 Other long term (current) drug therapy: Secondary | ICD-10-CM | POA: Diagnosis not present

## 2017-05-03 DIAGNOSIS — Z0000 Encounter for general adult medical examination without abnormal findings: Secondary | ICD-10-CM | POA: Diagnosis not present

## 2017-05-10 DIAGNOSIS — M545 Low back pain: Secondary | ICD-10-CM | POA: Diagnosis not present

## 2017-05-10 DIAGNOSIS — M9901 Segmental and somatic dysfunction of cervical region: Secondary | ICD-10-CM | POA: Diagnosis not present

## 2017-05-10 DIAGNOSIS — M9902 Segmental and somatic dysfunction of thoracic region: Secondary | ICD-10-CM | POA: Diagnosis not present

## 2017-05-10 DIAGNOSIS — M542 Cervicalgia: Secondary | ICD-10-CM | POA: Diagnosis not present

## 2017-05-10 DIAGNOSIS — M5387 Other specified dorsopathies, lumbosacral region: Secondary | ICD-10-CM | POA: Diagnosis not present

## 2017-05-10 DIAGNOSIS — R51 Headache: Secondary | ICD-10-CM | POA: Diagnosis not present

## 2017-05-10 DIAGNOSIS — M546 Pain in thoracic spine: Secondary | ICD-10-CM | POA: Diagnosis not present

## 2017-05-10 DIAGNOSIS — M9903 Segmental and somatic dysfunction of lumbar region: Secondary | ICD-10-CM | POA: Diagnosis not present

## 2017-05-10 DIAGNOSIS — M5412 Radiculopathy, cervical region: Secondary | ICD-10-CM | POA: Diagnosis not present

## 2017-06-07 DIAGNOSIS — M9901 Segmental and somatic dysfunction of cervical region: Secondary | ICD-10-CM | POA: Diagnosis not present

## 2017-06-07 DIAGNOSIS — M5412 Radiculopathy, cervical region: Secondary | ICD-10-CM | POA: Diagnosis not present

## 2017-06-07 DIAGNOSIS — M542 Cervicalgia: Secondary | ICD-10-CM | POA: Diagnosis not present

## 2017-06-07 DIAGNOSIS — R51 Headache: Secondary | ICD-10-CM | POA: Diagnosis not present

## 2017-06-07 DIAGNOSIS — M9902 Segmental and somatic dysfunction of thoracic region: Secondary | ICD-10-CM | POA: Diagnosis not present

## 2017-06-07 DIAGNOSIS — M9903 Segmental and somatic dysfunction of lumbar region: Secondary | ICD-10-CM | POA: Diagnosis not present

## 2017-06-07 DIAGNOSIS — M545 Low back pain: Secondary | ICD-10-CM | POA: Diagnosis not present

## 2017-06-07 DIAGNOSIS — M5387 Other specified dorsopathies, lumbosacral region: Secondary | ICD-10-CM | POA: Diagnosis not present

## 2017-06-07 DIAGNOSIS — M546 Pain in thoracic spine: Secondary | ICD-10-CM | POA: Diagnosis not present

## 2017-07-07 ENCOUNTER — Ambulatory Visit: Payer: 59 | Admitting: Certified Nurse Midwife

## 2017-07-08 ENCOUNTER — Ambulatory Visit: Payer: 59 | Admitting: Certified Nurse Midwife

## 2017-07-13 DIAGNOSIS — M5387 Other specified dorsopathies, lumbosacral region: Secondary | ICD-10-CM | POA: Diagnosis not present

## 2017-07-13 DIAGNOSIS — M545 Low back pain: Secondary | ICD-10-CM | POA: Diagnosis not present

## 2017-07-13 DIAGNOSIS — M9903 Segmental and somatic dysfunction of lumbar region: Secondary | ICD-10-CM | POA: Diagnosis not present

## 2017-07-13 DIAGNOSIS — R51 Headache: Secondary | ICD-10-CM | POA: Diagnosis not present

## 2017-07-13 DIAGNOSIS — M9902 Segmental and somatic dysfunction of thoracic region: Secondary | ICD-10-CM | POA: Diagnosis not present

## 2017-07-13 DIAGNOSIS — M5412 Radiculopathy, cervical region: Secondary | ICD-10-CM | POA: Diagnosis not present

## 2017-07-13 DIAGNOSIS — M546 Pain in thoracic spine: Secondary | ICD-10-CM | POA: Diagnosis not present

## 2017-07-13 DIAGNOSIS — M542 Cervicalgia: Secondary | ICD-10-CM | POA: Diagnosis not present

## 2017-07-13 DIAGNOSIS — M9901 Segmental and somatic dysfunction of cervical region: Secondary | ICD-10-CM | POA: Diagnosis not present

## 2017-07-18 DIAGNOSIS — Z853 Personal history of malignant neoplasm of breast: Secondary | ICD-10-CM | POA: Diagnosis not present

## 2017-07-27 ENCOUNTER — Encounter: Payer: Self-pay | Admitting: Certified Nurse Midwife

## 2017-07-27 ENCOUNTER — Ambulatory Visit (INDEPENDENT_AMBULATORY_CARE_PROVIDER_SITE_OTHER): Payer: 59 | Admitting: Certified Nurse Midwife

## 2017-07-27 VITALS — BP 120/80 | HR 87 | Ht 63.0 in | Wt 260.0 lb

## 2017-07-27 DIAGNOSIS — R1031 Right lower quadrant pain: Secondary | ICD-10-CM | POA: Diagnosis not present

## 2017-07-27 DIAGNOSIS — E119 Type 2 diabetes mellitus without complications: Secondary | ICD-10-CM | POA: Diagnosis not present

## 2017-07-27 DIAGNOSIS — I10 Essential (primary) hypertension: Secondary | ICD-10-CM | POA: Diagnosis not present

## 2017-07-27 DIAGNOSIS — R0609 Other forms of dyspnea: Secondary | ICD-10-CM | POA: Diagnosis not present

## 2017-07-27 DIAGNOSIS — Z01419 Encounter for gynecological examination (general) (routine) without abnormal findings: Secondary | ICD-10-CM | POA: Diagnosis not present

## 2017-07-27 DIAGNOSIS — E782 Mixed hyperlipidemia: Secondary | ICD-10-CM | POA: Diagnosis not present

## 2017-07-27 DIAGNOSIS — Z1231 Encounter for screening mammogram for malignant neoplasm of breast: Secondary | ICD-10-CM | POA: Diagnosis not present

## 2017-07-27 DIAGNOSIS — Z124 Encounter for screening for malignant neoplasm of cervix: Secondary | ICD-10-CM | POA: Diagnosis not present

## 2017-07-27 DIAGNOSIS — Z1239 Encounter for other screening for malignant neoplasm of breast: Secondary | ICD-10-CM

## 2017-07-27 NOTE — Progress Notes (Signed)
Gynecology Annual Exam  PCP: Derinda Late, MD  Chief Complaint:  Chief Complaint  Patient presents with  . Gynecologic Exam    History of Present Illness:Jacqueline Deleon is a 57 year old Caucasian/White female , G 1 P 1 0 0 1 , s/p supracervical hysterctomy, who presents for a annual exam. She also complains of RLQ cramping that she first noticed 3 mos ago. The pain lasts a few minutes and resolves spontaneously. Not associated with any activity. No blood in urine, dysuria, blood in stool. Was more of a daily pain, but has gotten less frequent.   Has not had any further spotting since October 2016. History of spotting after her Tamoxifen was discontinued on Aug 11, 2013. She is a 10 year breast cancer survivor. Her medical history is also significant for obesity, esophageal reflux, hypertension, migraine headaches, rosacea, nonalcoholic fatty liver, prediabetes, depression, basal cell carcinoma (left leg) and central serous retinopathy.    .  Since her last annual GYN exam dated 07/01/2016, she complains of feeling sleepy all the time. Finding it very difficult to get up in the AM, no matter how much sleep she gets. Many times goes to bed with her husband at 2000, then awakens at 2200 to urinate, then again at 0330 when her husband leaves for work (make lunch if not packed the night before), and then is suppose to get up at 6-6:30, but hits the snooze button often. States is having dreams. Does snore. Had a sleep study about 10 years ago that was normal. States that her Celexa was increased to 40 mgm for depression, but it does make her sleepy. TSH in 04/2017 was high normal.  She is sexually active. She does not have vaginal dryness. Denies hot flashes.   Her most recent pap smear was obtained 06/29/2016 and was normal.  Her most recent bilateral mammogram obtained on 06/29/2016 was normal and revealed no significant changes. On 10/13/2016, she also had a diagnostic mammogram on the  right breast for a right axillary lump which was thought to be a subcutaneous lipoma. She also had a biopsy of her nipple last year for a change in her nipple. The pathology returned scar tissue/ inflammation. Dr Rochel Brome followed up with her earlier this month for a breast exam/follow up and he said she could get her breast exams from her gynecologist. There is a positive history of breast cancer in her mother. Genetic testing has been done. She tested negative for BRCA 1 and negative for BRCA 2. There is no family history of ovarian cancer. The patient does do monthly self breast exams.  She had a colonoscopy in 2015 that was normal. Her next colonoscopy is due in 5 years.  A DEXA scan is not applicable for this patient.  The patient does not smoke.  The patient does not drink alcohol.  The patient does not use illegal drugs.  The patient has started to exercise at [facility] 3x/week after work. The patient does get adequate calcium in her diet.  She had a recent cholesterol screen 04/2017 that was normal.     Review of Systems: Review of Systems  Constitutional: Positive for malaise/fatigue. Negative for chills, fever and weight loss.  HENT: Positive for congestion. Negative for sinus pain and sore throat.   Eyes: Negative for blurred vision and pain.  Respiratory: Negative for hemoptysis, shortness of breath and wheezing.   Cardiovascular: Negative for chest pain, palpitations and leg swelling.  Gastrointestinal: Positive  for abdominal pain (RLQ). Negative for blood in stool, diarrhea, heartburn, nausea and vomiting.  Genitourinary: Negative for dysuria, frequency, hematuria and urgency.  Musculoskeletal: Negative for back pain, joint pain and myalgias.  Skin: Negative for itching and rash.  Neurological: Negative for dizziness, tingling and headaches.  Endo/Heme/Allergies: Positive for polydipsia. Negative for environmental allergies. Does not bruise/bleed easily.       Negative for  hirsutism   Psychiatric/Behavioral: Positive for depression. The patient is not nervous/anxious and does not have insomnia.     Past Medical History:  Past Medical History:  Diagnosis Date  . Arthritis   . Breast cancer ([place])    2009 infiltrating ductal cancer of right breast  . Breast mass 08/2006, 03/2009   left breast biopsy fibroadenoma (2008) right breast biopsy benign (2010)  . 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 Central serous retinopathy   . Fatty liver   . GERD (gastroesophageal reflux disease)   . Gestational diabetes   . Headache    migraines  . Heart murmur   . History of abnormal mammogram 08/2006, 01/2008, 03/2009  . Hypertension   . IBS (irritable bowel syndrome)   . Joint disorder    hx of left ankle tendonitis, bursitis, heel spur  . Obesity 2018   BMI 45  . Pelvic pain    adhesions and adenomyosis  . Rosacea     Past Surgical History:  Past Surgical History:  Procedure Laterality Date  . BREAST BIOPSY Left 08/2006   benign fibroadenoma  . BREAST SURGERY Right    x2/ Dr Tamala Julian  . CARDIAC CATHETERIZATION  2006  . CESAREAN SECTION  04/02/1998  . CHOLECYSTECTOMY  04/2010   Dr. Smith/ laprascopic surgery  . COLONOSCOPY  04/04/2000  . ESOPHAGOGASTRODUODENOSCOPY (EGD) WITH PROPOFOL N/A 05/01/2015   Procedure: ESOPHAGOGASTRODUODENOSCOPY (EGD) WITH PROPOFOL;  Surgeon: Hulen Luster, MD;  Location: [facility] ENDOSCOPY;  Service: Gastroenterology;  Laterality: N/A;  . EYE SURGERY  08/2012   laser surgery on retina/ DR Appenzeller  . FINGER SURGERY     repair of tendon in right index, Dr. Francesco Sor in [place]  . FOOT SURGERY     Dr. Milinda Pointer  . IRRIGATION AND DEBRIDEMENT SEBACEOUS CYST     right upper back  . LAPAROSCOPIC SUPRACERVICAL HYSTERECTOMY  06/2007   Dr Kincius/ adhesions/CPP/adenomyosis  . MASTECTOMY PARTIAL / LUMPECTOMY Right 01/2008   with sentinal lymph node biopsy/ infiltrating ductal cancer  . SKIN CANCER EXCISION     back and  calves  . WISDOM TOOTH EXTRACTION  1991    Family History:  Family History  Problem Relation Age of Onset  . Hypertension Mother   . Thyroid disease Mother   . Breast cancer Mother 8  . Colon cancer Mother 80  . Stroke Father   . Hypertension Father   . Cancer Father 73       prostate  . Parkinson's disease Father   . Hypertension Sister   . Thyroid disease Sister   . Cancer Sister        skin/ squamous cell  . Lung cancer Paternal Aunt 80       smoker  . Diabetes Maternal Grandmother   . Stroke Maternal Grandfather   . Heart disease Paternal Grandfather   . Diabetes Maternal Aunt   . Lymphoma Maternal Uncle   . Thyroid cancer Cousin        maternal  . Heart disease Maternal Uncle   .  Cancer Maternal Aunt     Social History:  Social History   Socioeconomic History  . Marital status: Married    Spouse name: Richardson Landry  . Number of children: 1  . Years of education: 46  . Highest education level: Not on file  Social Needs  . Financial resource strain: Not on file  . Food insecurity - worry: Not on file  . Food insecurity - inability: Not on file  . Transportation needs - medical: Not on file  . Transportation needs - non-medical: Not on file  Occupational History  . Occupation: CMA  Tobacco Use  . Smoking status: Never Smoker  . Smokeless tobacco: Never Used  Substance and Sexual Activity  . Alcohol use: Yes    Alcohol/week: 0.0 oz    Comment: rare, 1-2 drinks per year  . Drug use: No  . Sexual activity: Yes    Partners: Male    Birth control/protection: Surgical  Other Topics Concern  . Not on file  Social History Narrative   Lives in [place] with husband, no pets.  Works at [employer].      Diet - regular   Exercise - occasional    Allergies:  Allergies  Allergen Reactions  . Kiwi Extract Itching    swelling swelling swelling  . Other Swelling    Grapes--itching  . Codeine Other (See Comments)    Felt funny.  . Amoxicillin Rash  .  Erythromycin Rash  . Sulfa Antibiotics Rash  . Tape Rash    Medications:  Current Outpatient Medications:  .  acetaminophen (TYLENOL) 325 MG tablet, Take 650 mg by mouth every 6 (six) hours as needed., Disp: , Rfl:  .  amLODipine (NORVASC) 10 MG tablet, Take 0.5 tablets (5 mg total) by mouth daily., Disp: 90 tablet, Rfl: 3 .  Ascorbic Acid (VITAMIN C) 1000 MG tablet, Take by mouth., Disp: , Rfl:  .  Camphor-Eucalyptus-Menthol (VICKS VAPORUB) 4.7-1.2-2.6 % OINT, , Disp: , Rfl:  .  citalopram (CELEXA) 40 MG tablet, , Disp: , Rfl: 1 .  clotrimazole (CLOTRIMAZOLE ANTI-FUNGAL) 1 % cream, Apply topically., Disp: , Rfl:  .  diphenhydrAMINE (BENADRYL) 25 MG tablet, Take by mouth., Disp: , Rfl:  .  ibuprofen (ADVIL,MOTRIN) 200 MG tablet, Take 200 mg by mouth every 6 (six) hours as needed., Disp: , Rfl:  .  ketotifen (ZADITOR) 0.025 % ophthalmic solution, Apply to eye., Disp: , Rfl:  .  lisinopril (PRINIVIL,ZESTRIL) 10 MG tablet, Take by mouth., Disp: , Rfl:  .  loratadine (CLARITIN) 10 MG tablet, Take by mouth., Disp: , Rfl:  .  meclizine (ANTIVERT) 25 MG tablet, , Disp: , Rfl: 0 .  metFORMIN (GLUCOPHAGE-XR) 500 MG 24 hr tablet, , Disp: , Rfl:  .  metroNIDAZOLE (METROGEL) 0.75 % gel, , Disp: , Rfl:  .  minocycline (MINOCIN,DYNACIN) 100 MG capsule, Take 100 mg by mouth 2 (two) times daily., Disp: , Rfl:  .  Multiple Vitamins-Minerals (MULTIVITAMIN & MINERAL PO), Take by mouth., Disp: , Rfl:  .  nitroGLYCERIN (NITROSTAT) 0.4 MG SL tablet, Place under the tongue., Disp: , Rfl:  .  omeprazole (PRILOSEC) 20 MG capsule, Take 20 mg by mouth daily., Disp: , Rfl:  Physical Exam Vitals: BP 120/80   Pulse 87   Ht _0  (1.6 m)   Wt 260 lb (117.9 kg)   BMI 46.06 kg/m   General: WF in NAD HEENT: normocephalic, anicteric Neck: no thyroid enlargement, no palpable nodules, no cervical lymphadenopathy  Pulmonary:  No increased work of breathing, CTAB Cardiovascular: RRR, without murmur  Breast: deferred  due to recent exam by Dr Rochel Brome. Abdomen: Soft, non-tender, obese, non-distended.  Umbilicus without lesions.  No hepatomegaly or masses palpable. No evidence of hernia. Genitourinary:  External: Multiple epidermal cysts in bilateral labia majora; Normal urethral meatus, normal Bartholin's and Skene's glands.    Vagina: Normal vaginal mucosa, no evidence of prolapse.    Cervix: Grossly normal in appearance, no bleeding, non-tender  Uterus: surgically absent  Adnexa: No adnexal masses, non-tender  Rectal: deferred  Lymphatic: no evidence of inguinal lymphadenopathy Extremities: no edema, erythema, or tenderness Neurologic: Grossly intact Psychiatric: mood appropriate, affect full     Assessment: 57 y.o. G1P0101 annual gyn exam Tired/ excessively sleepiness RLQ pain   Plan:   1) Breast cancer screening - recommend monthly self breast exam and annual mammograms. Mammogram was ordered today.  2) Pelvic ultrasound ordered to investigate RLQ pain  3) Cervical cancer screening - Pap was done. ASCCP guidelines and rational discussed.  Patient opts for yearly screening interval  4) Colon cancer screen-colonoscopy due next year  5) Routine healthcare maintenance including cholesterol and diabetes testing UTD . Repeat TSH in March/April (was high normal in Oct 2018).  6) Follow up after Pelvic ultrasound.   Dalia Heading, CNM

## 2017-07-29 LAB — IGP, APTIMA HPV
HPV APTIMA: NEGATIVE
PAP Smear Comment: 0

## 2017-07-31 ENCOUNTER — Encounter: Payer: Self-pay | Admitting: Certified Nurse Midwife

## 2017-08-02 ENCOUNTER — Ambulatory Visit (INDEPENDENT_AMBULATORY_CARE_PROVIDER_SITE_OTHER): Payer: 59

## 2017-08-02 DIAGNOSIS — R1031 Right lower quadrant pain: Secondary | ICD-10-CM

## 2017-08-16 DIAGNOSIS — G4730 Sleep apnea, unspecified: Secondary | ICD-10-CM | POA: Diagnosis not present

## 2017-08-18 DIAGNOSIS — M542 Cervicalgia: Secondary | ICD-10-CM | POA: Diagnosis not present

## 2017-08-18 DIAGNOSIS — M545 Low back pain: Secondary | ICD-10-CM | POA: Diagnosis not present

## 2017-08-18 DIAGNOSIS — R51 Headache: Secondary | ICD-10-CM | POA: Diagnosis not present

## 2017-08-18 DIAGNOSIS — M5412 Radiculopathy, cervical region: Secondary | ICD-10-CM | POA: Diagnosis not present

## 2017-08-18 DIAGNOSIS — M546 Pain in thoracic spine: Secondary | ICD-10-CM | POA: Diagnosis not present

## 2017-08-18 DIAGNOSIS — M9903 Segmental and somatic dysfunction of lumbar region: Secondary | ICD-10-CM | POA: Diagnosis not present

## 2017-08-18 DIAGNOSIS — M5387 Other specified dorsopathies, lumbosacral region: Secondary | ICD-10-CM | POA: Diagnosis not present

## 2017-08-18 DIAGNOSIS — M9901 Segmental and somatic dysfunction of cervical region: Secondary | ICD-10-CM | POA: Diagnosis not present

## 2017-08-18 DIAGNOSIS — M9902 Segmental and somatic dysfunction of thoracic region: Secondary | ICD-10-CM | POA: Diagnosis not present

## 2017-08-30 ENCOUNTER — Other Ambulatory Visit: Payer: Self-pay | Admitting: Gastroenterology

## 2017-08-30 DIAGNOSIS — K7460 Unspecified cirrhosis of liver: Secondary | ICD-10-CM | POA: Diagnosis not present

## 2017-08-30 DIAGNOSIS — R197 Diarrhea, unspecified: Secondary | ICD-10-CM | POA: Diagnosis not present

## 2017-09-02 ENCOUNTER — Ambulatory Visit: Payer: 59

## 2017-09-05 ENCOUNTER — Ambulatory Visit
Admission: RE | Admit: 2017-09-05 | Discharge: 2017-09-05 | Disposition: A | Discharge status: Home / Self Care | Payer: 59 | Source: Ambulatory Visit | Attending: Gastroenterology | Admitting: Gastroenterology

## 2017-09-05 DIAGNOSIS — K7460 Unspecified cirrhosis of liver: Secondary | ICD-10-CM | POA: Insufficient documentation

## 2017-09-05 DIAGNOSIS — Z9049 Acquired absence of other specified parts of digestive tract: Secondary | ICD-10-CM | POA: Insufficient documentation

## 2017-09-13 DIAGNOSIS — G4733 Obstructive sleep apnea (adult) (pediatric): Secondary | ICD-10-CM | POA: Diagnosis not present

## 2017-09-15 DIAGNOSIS — R51 Headache: Secondary | ICD-10-CM | POA: Diagnosis not present

## 2017-09-15 DIAGNOSIS — M9902 Segmental and somatic dysfunction of thoracic region: Secondary | ICD-10-CM | POA: Diagnosis not present

## 2017-09-15 DIAGNOSIS — M5412 Radiculopathy, cervical region: Secondary | ICD-10-CM | POA: Diagnosis not present

## 2017-09-15 DIAGNOSIS — M545 Low back pain: Secondary | ICD-10-CM | POA: Diagnosis not present

## 2017-09-15 DIAGNOSIS — M9901 Segmental and somatic dysfunction of cervical region: Secondary | ICD-10-CM | POA: Diagnosis not present

## 2017-09-15 DIAGNOSIS — M5387 Other specified dorsopathies, lumbosacral region: Secondary | ICD-10-CM | POA: Diagnosis not present

## 2017-09-15 DIAGNOSIS — M9903 Segmental and somatic dysfunction of lumbar region: Secondary | ICD-10-CM | POA: Diagnosis not present

## 2017-09-15 DIAGNOSIS — M546 Pain in thoracic spine: Secondary | ICD-10-CM | POA: Diagnosis not present

## 2017-09-15 DIAGNOSIS — M542 Cervicalgia: Secondary | ICD-10-CM | POA: Diagnosis not present

## 2017-10-13 DIAGNOSIS — M9902 Segmental and somatic dysfunction of thoracic region: Secondary | ICD-10-CM | POA: Diagnosis not present

## 2017-10-13 DIAGNOSIS — M5387 Other specified dorsopathies, lumbosacral region: Secondary | ICD-10-CM | POA: Diagnosis not present

## 2017-10-13 DIAGNOSIS — M546 Pain in thoracic spine: Secondary | ICD-10-CM | POA: Diagnosis not present

## 2017-10-13 DIAGNOSIS — M9903 Segmental and somatic dysfunction of lumbar region: Secondary | ICD-10-CM | POA: Diagnosis not present

## 2017-10-13 DIAGNOSIS — M9901 Segmental and somatic dysfunction of cervical region: Secondary | ICD-10-CM | POA: Diagnosis not present

## 2017-10-13 DIAGNOSIS — M5412 Radiculopathy, cervical region: Secondary | ICD-10-CM | POA: Diagnosis not present

## 2017-10-13 DIAGNOSIS — M542 Cervicalgia: Secondary | ICD-10-CM | POA: Diagnosis not present

## 2017-10-13 DIAGNOSIS — M545 Low back pain: Secondary | ICD-10-CM | POA: Diagnosis not present

## 2017-10-13 DIAGNOSIS — R51 Headache: Secondary | ICD-10-CM | POA: Diagnosis not present

## 2017-10-14 DIAGNOSIS — G4733 Obstructive sleep apnea (adult) (pediatric): Secondary | ICD-10-CM | POA: Diagnosis not present

## 2017-10-27 ENCOUNTER — Other Ambulatory Visit: Payer: Self-pay | Admitting: Certified Nurse Midwife

## 2017-10-27 DIAGNOSIS — I10 Essential (primary) hypertension: Secondary | ICD-10-CM

## 2017-10-27 DIAGNOSIS — R7309 Other abnormal glucose: Secondary | ICD-10-CM

## 2017-10-27 DIAGNOSIS — E7800 Pure hypercholesterolemia, unspecified: Secondary | ICD-10-CM

## 2017-10-27 DIAGNOSIS — K760 Fatty (change of) liver, not elsewhere classified: Secondary | ICD-10-CM

## 2017-10-27 DIAGNOSIS — Z79899 Other long term (current) drug therapy: Secondary | ICD-10-CM

## 2017-10-31 ENCOUNTER — Other Ambulatory Visit: Payer: 59

## 2017-10-31 DIAGNOSIS — I10 Essential (primary) hypertension: Secondary | ICD-10-CM | POA: Diagnosis not present

## 2017-10-31 DIAGNOSIS — K760 Fatty (change of) liver, not elsewhere classified: Secondary | ICD-10-CM

## 2017-10-31 DIAGNOSIS — E7800 Pure hypercholesterolemia, unspecified: Secondary | ICD-10-CM

## 2017-10-31 DIAGNOSIS — R7309 Other abnormal glucose: Secondary | ICD-10-CM | POA: Diagnosis not present

## 2017-10-31 DIAGNOSIS — Z79899 Other long term (current) drug therapy: Secondary | ICD-10-CM

## 2017-11-01 DIAGNOSIS — E119 Type 2 diabetes mellitus without complications: Secondary | ICD-10-CM | POA: Diagnosis not present

## 2017-11-01 DIAGNOSIS — K219 Gastro-esophageal reflux disease without esophagitis: Secondary | ICD-10-CM | POA: Diagnosis not present

## 2017-11-01 DIAGNOSIS — G4733 Obstructive sleep apnea (adult) (pediatric): Secondary | ICD-10-CM | POA: Diagnosis not present

## 2017-11-01 DIAGNOSIS — F419 Anxiety disorder, unspecified: Secondary | ICD-10-CM | POA: Diagnosis not present

## 2017-11-01 DIAGNOSIS — I10 Essential (primary) hypertension: Secondary | ICD-10-CM | POA: Diagnosis not present

## 2017-11-01 LAB — CBC WITH DIFFERENTIAL/PLATELET
BASOS ABS: 0 10*3/uL (ref 0.0–0.2)
BASOS: 0 %
EOS (ABSOLUTE): 0.1 10*3/uL (ref 0.0–0.4)
Eos: 2 %
Hematocrit: 37.9 % (ref 34.0–46.6)
Hemoglobin: 12 g/dL (ref 11.1–15.9)
IMMATURE GRANS (ABS): 0 10*3/uL (ref 0.0–0.1)
IMMATURE GRANULOCYTES: 0 %
LYMPHS: 22 %
Lymphocytes Absolute: 1.7 10*3/uL (ref 0.7–3.1)
MCH: 24.7 pg — ABNORMAL LOW (ref 26.6–33.0)
MCHC: 31.7 g/dL (ref 31.5–35.7)
MCV: 78 fL — ABNORMAL LOW (ref 79–97)
MONOS ABS: 0.6 10*3/uL (ref 0.1–0.9)
Monocytes: 8 %
NEUTROS PCT: 68 %
Neutrophils Absolute: 5.4 10*3/uL (ref 1.4–7.0)
Platelets: 261 10*3/uL (ref 150–379)
RBC: 4.85 x10E6/uL (ref 3.77–5.28)
RDW: 16.1 % — AB (ref 12.3–15.4)
WBC: 8 10*3/uL (ref 3.4–10.8)

## 2017-11-01 LAB — COMPREHENSIVE METABOLIC PANEL
A/G RATIO: 1.6 (ref 1.2–2.2)
ALK PHOS: 127 IU/L — AB (ref 39–117)
ALT: 16 IU/L (ref 0–32)
AST: 8 IU/L (ref 0–40)
Albumin: 4.2 g/dL (ref 3.5–5.5)
BUN/Creatinine Ratio: 23 (ref 9–23)
BUN: 16 mg/dL (ref 6–24)
Bilirubin Total: 0.2 mg/dL (ref 0.0–1.2)
CALCIUM: 9.4 mg/dL (ref 8.7–10.2)
CHLORIDE: 103 mmol/L (ref 96–106)
CO2: 22 mmol/L (ref 20–29)
Creatinine, Ser: 0.7 mg/dL (ref 0.57–1.00)
GFR calc Af Amer: 114 mL/min/{1.73_m2} (ref >59)
GFR, EST NON AFRICAN AMERICAN: 99 mL/min/{1.73_m2} (ref >59)
Globulin, Total: 2.6 g/dL (ref 1.5–4.5)
Glucose: 94 mg/dL (ref 65–99)
POTASSIUM: 4.5 mmol/L (ref 3.5–5.2)
SODIUM: 140 mmol/L (ref 134–144)
Total Protein: 6.8 g/dL (ref 6.0–8.5)

## 2017-11-01 LAB — LIPID PANEL
CHOL/HDL RATIO: 3.1 ratio (ref 0.0–4.4)
Cholesterol, Total: 163 mg/dL (ref 100–199)
HDL: 52 mg/dL (ref >39)
LDL CALC: 93 mg/dL (ref 0–99)
TRIGLYCERIDES: 89 mg/dL (ref 0–149)
VLDL Cholesterol Cal: 18 mg/dL (ref 5–40)

## 2017-11-01 LAB — BILIRUBIN, DIRECT: BILIRUBIN, DIRECT: 0.09 mg/dL (ref 0.00–0.40)

## 2017-11-01 LAB — TSH: TSH: 3.95 u[IU]/mL (ref 0.450–4.500)

## 2017-11-01 LAB — HEMOGLOBIN A1C
Est. average glucose Bld gHb Est-mCnc: 123 mg/dL
HEMOGLOBIN A1C: 5.9 % — AB (ref 4.8–5.6)

## 2017-11-01 LAB — AFP TUMOR MARKER: AFP, SERUM, TUMOR MARKER: 1.9 ng/mL (ref 0.0–8.3)

## 2017-11-08 ENCOUNTER — Other Ambulatory Visit: Payer: Self-pay | Admitting: Maternal Newborn

## 2017-11-08 ENCOUNTER — Other Ambulatory Visit: Payer: 59

## 2017-11-08 DIAGNOSIS — R319 Hematuria, unspecified: Secondary | ICD-10-CM

## 2017-11-08 DIAGNOSIS — R350 Frequency of micturition: Secondary | ICD-10-CM | POA: Diagnosis not present

## 2017-11-08 DIAGNOSIS — N390 Urinary tract infection, site not specified: Secondary | ICD-10-CM

## 2017-11-08 LAB — URINALYSIS
SPECIFIC GRAVITY: 1.025
pH, UA: 6 (ref 4.5–8.0)

## 2017-11-08 MED ORDER — NITROFURANTOIN MONOHYD MACRO 100 MG PO CAPS: 100.0000 mg | ORAL_CAPSULE | Freq: Two times a day (BID) | ORAL | 0 refills | Status: DC

## 2017-11-08 NOTE — Progress Notes (Signed)
Macrobid Rx sent to pharmacy; empiric therapy based on UA results/symptoms. Will notify if change of therapy indicated based on urine culture results.  Avel Sensor, CNM 11/08/2017  1:40 PM

## 2017-11-08 NOTE — Progress Notes (Unsigned)
UA results entered

## 2017-11-10 LAB — URINE CULTURE

## 2017-11-13 DIAGNOSIS — G4733 Obstructive sleep apnea (adult) (pediatric): Secondary | ICD-10-CM | POA: Diagnosis not present

## 2017-11-14 ENCOUNTER — Other Ambulatory Visit: Payer: Self-pay | Admitting: Certified Nurse Midwife

## 2017-11-14 DIAGNOSIS — Z1231 Encounter for screening mammogram for malignant neoplasm of breast: Secondary | ICD-10-CM

## 2017-11-23 DIAGNOSIS — M9903 Segmental and somatic dysfunction of lumbar region: Secondary | ICD-10-CM | POA: Diagnosis not present

## 2017-11-23 DIAGNOSIS — M9901 Segmental and somatic dysfunction of cervical region: Secondary | ICD-10-CM | POA: Diagnosis not present

## 2017-11-23 DIAGNOSIS — M545 Low back pain: Secondary | ICD-10-CM | POA: Diagnosis not present

## 2017-11-23 DIAGNOSIS — M9902 Segmental and somatic dysfunction of thoracic region: Secondary | ICD-10-CM | POA: Diagnosis not present

## 2017-11-23 DIAGNOSIS — R51 Headache: Secondary | ICD-10-CM | POA: Diagnosis not present

## 2017-11-23 DIAGNOSIS — M5387 Other specified dorsopathies, lumbosacral region: Secondary | ICD-10-CM | POA: Diagnosis not present

## 2017-11-23 DIAGNOSIS — M546 Pain in thoracic spine: Secondary | ICD-10-CM | POA: Diagnosis not present

## 2017-11-23 DIAGNOSIS — M542 Cervicalgia: Secondary | ICD-10-CM | POA: Diagnosis not present

## 2017-11-23 DIAGNOSIS — M5412 Radiculopathy, cervical region: Secondary | ICD-10-CM | POA: Diagnosis not present

## 2017-12-13 ENCOUNTER — Ambulatory Visit
Admission: RE | Admit: 2017-12-13 | Discharge: 2017-12-13 | Disposition: A | Discharge status: Home / Self Care | Payer: 59 | Source: Ambulatory Visit | Attending: Certified Nurse Midwife | Admitting: Certified Nurse Midwife

## 2017-12-13 DIAGNOSIS — Z1231 Encounter for screening mammogram for malignant neoplasm of breast: Secondary | ICD-10-CM | POA: Diagnosis not present

## 2017-12-14 DIAGNOSIS — G4733 Obstructive sleep apnea (adult) (pediatric): Secondary | ICD-10-CM | POA: Diagnosis not present

## 2017-12-21 DIAGNOSIS — R51 Headache: Secondary | ICD-10-CM | POA: Diagnosis not present

## 2017-12-21 DIAGNOSIS — M9902 Segmental and somatic dysfunction of thoracic region: Secondary | ICD-10-CM | POA: Diagnosis not present

## 2017-12-21 DIAGNOSIS — M9903 Segmental and somatic dysfunction of lumbar region: Secondary | ICD-10-CM | POA: Diagnosis not present

## 2017-12-21 DIAGNOSIS — M542 Cervicalgia: Secondary | ICD-10-CM | POA: Diagnosis not present

## 2017-12-21 DIAGNOSIS — M546 Pain in thoracic spine: Secondary | ICD-10-CM | POA: Diagnosis not present

## 2017-12-21 DIAGNOSIS — M5412 Radiculopathy, cervical region: Secondary | ICD-10-CM | POA: Diagnosis not present

## 2017-12-21 DIAGNOSIS — M9901 Segmental and somatic dysfunction of cervical region: Secondary | ICD-10-CM | POA: Diagnosis not present

## 2017-12-21 DIAGNOSIS — M5387 Other specified dorsopathies, lumbosacral region: Secondary | ICD-10-CM | POA: Diagnosis not present

## 2017-12-21 DIAGNOSIS — M545 Low back pain: Secondary | ICD-10-CM | POA: Diagnosis not present

## 2018-01-24 DIAGNOSIS — R51 Headache: Secondary | ICD-10-CM | POA: Diagnosis not present

## 2018-01-24 DIAGNOSIS — M5387 Other specified dorsopathies, lumbosacral region: Secondary | ICD-10-CM | POA: Diagnosis not present

## 2018-01-24 DIAGNOSIS — M9901 Segmental and somatic dysfunction of cervical region: Secondary | ICD-10-CM | POA: Diagnosis not present

## 2018-01-24 DIAGNOSIS — M542 Cervicalgia: Secondary | ICD-10-CM | POA: Diagnosis not present

## 2018-01-24 DIAGNOSIS — M9903 Segmental and somatic dysfunction of lumbar region: Secondary | ICD-10-CM | POA: Diagnosis not present

## 2018-01-24 DIAGNOSIS — M5412 Radiculopathy, cervical region: Secondary | ICD-10-CM | POA: Diagnosis not present

## 2018-01-24 DIAGNOSIS — M545 Low back pain: Secondary | ICD-10-CM | POA: Diagnosis not present

## 2018-01-24 DIAGNOSIS — M9902 Segmental and somatic dysfunction of thoracic region: Secondary | ICD-10-CM | POA: Diagnosis not present

## 2018-01-24 DIAGNOSIS — M546 Pain in thoracic spine: Secondary | ICD-10-CM | POA: Diagnosis not present

## 2018-01-31 DIAGNOSIS — F3342 Major depressive disorder, recurrent, in full remission: Secondary | ICD-10-CM | POA: Diagnosis not present

## 2018-02-28 ENCOUNTER — Other Ambulatory Visit: Payer: Self-pay | Admitting: Gastroenterology

## 2018-02-28 DIAGNOSIS — M542 Cervicalgia: Secondary | ICD-10-CM | POA: Diagnosis not present

## 2018-02-28 DIAGNOSIS — M546 Pain in thoracic spine: Secondary | ICD-10-CM | POA: Diagnosis not present

## 2018-02-28 DIAGNOSIS — R51 Headache: Secondary | ICD-10-CM | POA: Diagnosis not present

## 2018-02-28 DIAGNOSIS — K7460 Unspecified cirrhosis of liver: Secondary | ICD-10-CM

## 2018-02-28 DIAGNOSIS — R197 Diarrhea, unspecified: Secondary | ICD-10-CM | POA: Diagnosis not present

## 2018-02-28 DIAGNOSIS — M9901 Segmental and somatic dysfunction of cervical region: Secondary | ICD-10-CM | POA: Diagnosis not present

## 2018-02-28 DIAGNOSIS — M9903 Segmental and somatic dysfunction of lumbar region: Secondary | ICD-10-CM | POA: Diagnosis not present

## 2018-02-28 DIAGNOSIS — M5387 Other specified dorsopathies, lumbosacral region: Secondary | ICD-10-CM | POA: Diagnosis not present

## 2018-02-28 DIAGNOSIS — M9902 Segmental and somatic dysfunction of thoracic region: Secondary | ICD-10-CM | POA: Diagnosis not present

## 2018-02-28 DIAGNOSIS — R1031 Right lower quadrant pain: Secondary | ICD-10-CM | POA: Diagnosis not present

## 2018-02-28 DIAGNOSIS — R718 Other abnormality of red blood cells: Secondary | ICD-10-CM | POA: Diagnosis not present

## 2018-02-28 DIAGNOSIS — M5412 Radiculopathy, cervical region: Secondary | ICD-10-CM | POA: Diagnosis not present

## 2018-02-28 DIAGNOSIS — M545 Low back pain: Secondary | ICD-10-CM | POA: Diagnosis not present

## 2018-03-06 DIAGNOSIS — Z1283 Encounter for screening for malignant neoplasm of skin: Secondary | ICD-10-CM | POA: Diagnosis not present

## 2018-03-06 DIAGNOSIS — D229 Melanocytic nevi, unspecified: Secondary | ICD-10-CM | POA: Diagnosis not present

## 2018-03-06 DIAGNOSIS — D1800 Hemangioma unspecified site: Secondary | ICD-10-CM | POA: Diagnosis not present

## 2018-03-06 DIAGNOSIS — L739 Follicular disorder, unspecified: Secondary | ICD-10-CM | POA: Diagnosis not present

## 2018-03-06 DIAGNOSIS — L578 Other skin changes due to chronic exposure to nonionizing radiation: Secondary | ICD-10-CM | POA: Diagnosis not present

## 2018-03-06 DIAGNOSIS — L718 Other rosacea: Secondary | ICD-10-CM | POA: Diagnosis not present

## 2018-03-06 DIAGNOSIS — D485 Neoplasm of uncertain behavior of skin: Secondary | ICD-10-CM | POA: Diagnosis not present

## 2018-03-06 DIAGNOSIS — L821 Other seborrheic keratosis: Secondary | ICD-10-CM | POA: Diagnosis not present

## 2018-03-06 DIAGNOSIS — Z85828 Personal history of other malignant neoplasm of skin: Secondary | ICD-10-CM | POA: Diagnosis not present

## 2018-03-08 ENCOUNTER — Ambulatory Visit
Admission: RE | Admit: 2018-03-08 | Discharge: 2018-03-08 | Disposition: A | Discharge status: Home / Self Care | Payer: 59 | Source: Ambulatory Visit | Attending: Gastroenterology | Admitting: Gastroenterology

## 2018-03-08 DIAGNOSIS — K7460 Unspecified cirrhosis of liver: Secondary | ICD-10-CM | POA: Diagnosis not present

## 2018-03-08 DIAGNOSIS — K7689 Other specified diseases of liver: Secondary | ICD-10-CM | POA: Diagnosis not present

## 2018-03-08 DIAGNOSIS — Z9049 Acquired absence of other specified parts of digestive tract: Secondary | ICD-10-CM | POA: Diagnosis not present

## 2018-03-08 DIAGNOSIS — R932 Abnormal findings on diagnostic imaging of liver and biliary tract: Secondary | ICD-10-CM | POA: Diagnosis not present

## 2018-03-20 DIAGNOSIS — M5387 Other specified dorsopathies, lumbosacral region: Secondary | ICD-10-CM | POA: Diagnosis not present

## 2018-03-20 DIAGNOSIS — M9901 Segmental and somatic dysfunction of cervical region: Secondary | ICD-10-CM | POA: Diagnosis not present

## 2018-03-20 DIAGNOSIS — M9904 Segmental and somatic dysfunction of sacral region: Secondary | ICD-10-CM | POA: Diagnosis not present

## 2018-03-20 DIAGNOSIS — M5412 Radiculopathy, cervical region: Secondary | ICD-10-CM | POA: Diagnosis not present

## 2018-03-20 DIAGNOSIS — M545 Low back pain: Secondary | ICD-10-CM | POA: Diagnosis not present

## 2018-03-20 DIAGNOSIS — M546 Pain in thoracic spine: Secondary | ICD-10-CM | POA: Diagnosis not present

## 2018-03-20 DIAGNOSIS — M542 Cervicalgia: Secondary | ICD-10-CM | POA: Diagnosis not present

## 2018-03-20 DIAGNOSIS — R51 Headache: Secondary | ICD-10-CM | POA: Diagnosis not present

## 2018-03-20 DIAGNOSIS — M9903 Segmental and somatic dysfunction of lumbar region: Secondary | ICD-10-CM | POA: Diagnosis not present

## 2018-04-11 ENCOUNTER — Ambulatory Visit (INDEPENDENT_AMBULATORY_CARE_PROVIDER_SITE_OTHER): Payer: 59

## 2018-04-11 DIAGNOSIS — Z23 Encounter for immunization: Secondary | ICD-10-CM

## 2018-04-17 DIAGNOSIS — M9901 Segmental and somatic dysfunction of cervical region: Secondary | ICD-10-CM | POA: Diagnosis not present

## 2018-04-17 DIAGNOSIS — M5412 Radiculopathy, cervical region: Secondary | ICD-10-CM | POA: Diagnosis not present

## 2018-04-17 DIAGNOSIS — M9903 Segmental and somatic dysfunction of lumbar region: Secondary | ICD-10-CM | POA: Diagnosis not present

## 2018-04-17 DIAGNOSIS — M5387 Other specified dorsopathies, lumbosacral region: Secondary | ICD-10-CM | POA: Diagnosis not present

## 2018-04-17 DIAGNOSIS — M546 Pain in thoracic spine: Secondary | ICD-10-CM | POA: Diagnosis not present

## 2018-04-17 DIAGNOSIS — M9904 Segmental and somatic dysfunction of sacral region: Secondary | ICD-10-CM | POA: Diagnosis not present

## 2018-04-17 DIAGNOSIS — M542 Cervicalgia: Secondary | ICD-10-CM | POA: Diagnosis not present

## 2018-04-17 DIAGNOSIS — M545 Low back pain: Secondary | ICD-10-CM | POA: Diagnosis not present

## 2018-04-17 DIAGNOSIS — R51 Headache: Secondary | ICD-10-CM | POA: Diagnosis not present

## 2018-04-28 ENCOUNTER — Other Ambulatory Visit: Payer: Self-pay | Admitting: Certified Nurse Midwife

## 2018-04-28 DIAGNOSIS — K760 Fatty (change of) liver, not elsewhere classified: Secondary | ICD-10-CM

## 2018-04-28 DIAGNOSIS — R1031 Right lower quadrant pain: Secondary | ICD-10-CM

## 2018-04-28 DIAGNOSIS — Z13220 Encounter for screening for lipoid disorders: Secondary | ICD-10-CM

## 2018-04-28 DIAGNOSIS — E119 Type 2 diabetes mellitus without complications: Secondary | ICD-10-CM

## 2018-04-28 DIAGNOSIS — Z79899 Other long term (current) drug therapy: Secondary | ICD-10-CM

## 2018-04-28 DIAGNOSIS — R1032 Left lower quadrant pain: Secondary | ICD-10-CM

## 2018-04-28 DIAGNOSIS — R718 Other abnormality of red blood cells: Secondary | ICD-10-CM

## 2018-04-28 DIAGNOSIS — R197 Diarrhea, unspecified: Secondary | ICD-10-CM

## 2018-05-02 ENCOUNTER — Other Ambulatory Visit: Payer: 59

## 2018-05-02 DIAGNOSIS — Z13220 Encounter for screening for lipoid disorders: Secondary | ICD-10-CM | POA: Diagnosis not present

## 2018-05-02 DIAGNOSIS — Z79899 Other long term (current) drug therapy: Secondary | ICD-10-CM

## 2018-05-02 DIAGNOSIS — R1031 Right lower quadrant pain: Secondary | ICD-10-CM

## 2018-05-02 DIAGNOSIS — K760 Fatty (change of) liver, not elsewhere classified: Secondary | ICD-10-CM

## 2018-05-02 DIAGNOSIS — R718 Other abnormality of red blood cells: Secondary | ICD-10-CM | POA: Diagnosis not present

## 2018-05-02 DIAGNOSIS — E119 Type 2 diabetes mellitus without complications: Secondary | ICD-10-CM | POA: Diagnosis not present

## 2018-05-02 DIAGNOSIS — R1032 Left lower quadrant pain: Secondary | ICD-10-CM | POA: Diagnosis not present

## 2018-05-02 DIAGNOSIS — R197 Diarrhea, unspecified: Secondary | ICD-10-CM

## 2018-05-03 LAB — CBC WITH DIFFERENTIAL/PLATELET
BASOS ABS: 0 10*3/uL (ref 0.0–0.2)
Basos: 0 %
EOS (ABSOLUTE): 0.1 10*3/uL (ref 0.0–0.4)
Eos: 2 %
HEMOGLOBIN: 12 g/dL (ref 11.1–15.9)
Hematocrit: 37.9 % (ref 34.0–46.6)
IMMATURE GRANS (ABS): 0 10*3/uL (ref 0.0–0.1)
Immature Granulocytes: 0 %
LYMPHS: 17 %
Lymphocytes Absolute: 1.3 10*3/uL (ref 0.7–3.1)
MCH: 23.9 pg — ABNORMAL LOW (ref 26.6–33.0)
MCHC: 31.7 g/dL (ref 31.5–35.7)
MCV: 76 fL — ABNORMAL LOW (ref 79–97)
Monocytes Absolute: 0.7 10*3/uL (ref 0.1–0.9)
Monocytes: 9 %
Neutrophils Absolute: 5.3 10*3/uL (ref 1.4–7.0)
Neutrophils: 72 %
Platelets: 247 10*3/uL (ref 150–450)
RBC: 5.02 x10E6/uL (ref 3.77–5.28)
RDW: 16.7 % — ABNORMAL HIGH (ref 12.3–15.4)
WBC: 7.5 10*3/uL (ref 3.4–10.8)

## 2018-05-03 LAB — COMPREHENSIVE METABOLIC PANEL
ALBUMIN: 4.3 g/dL (ref 3.5–5.5)
ALT: 16 IU/L (ref 0–32)
AST: 13 IU/L (ref 0–40)
Albumin/Globulin Ratio: 1.8 (ref 1.2–2.2)
Alkaline Phosphatase: 126 IU/L — ABNORMAL HIGH (ref 39–117)
BUN / CREAT RATIO: 14 (ref 9–23)
BUN: 12 mg/dL (ref 6–24)
Bilirubin Total: 0.4 mg/dL (ref 0.0–1.2)
CO2: 23 mmol/L (ref 20–29)
CREATININE: 0.84 mg/dL (ref 0.57–1.00)
Calcium: 9.2 mg/dL (ref 8.7–10.2)
Chloride: 101 mmol/L (ref 96–106)
GFR calc Af Amer: 90 mL/min/{1.73_m2} (ref >59)
GFR calc non Af Amer: 78 mL/min/{1.73_m2} (ref >59)
GLUCOSE: 102 mg/dL — AB (ref 65–99)
Globulin, Total: 2.4 g/dL (ref 1.5–4.5)
Potassium: 4.8 mmol/L (ref 3.5–5.2)
Sodium: 140 mmol/L (ref 134–144)
Total Protein: 6.7 g/dL (ref 6.0–8.5)

## 2018-05-03 LAB — AFP TUMOR MARKER: AFP, Serum, Tumor Marker: 1.7 ng/mL (ref 0.0–8.3)

## 2018-05-03 LAB — MICROALBUMIN, URINE: Microalbumin, Urine: <3 ug/mL

## 2018-05-03 LAB — HEMOGLOBIN A1C
Est. average glucose Bld gHb Est-mCnc: 120 mg/dL
Hgb A1c MFr Bld: 5.8 % — ABNORMAL HIGH (ref 4.8–5.6)

## 2018-05-03 LAB — LIPID PANEL
CHOL/HDL RATIO: 2.9 ratio (ref 0.0–4.4)
CHOLESTEROL TOTAL: 162 mg/dL (ref 100–199)
HDL: 55 mg/dL (ref >39)
LDL CALC: 86 mg/dL (ref 0–99)
TRIGLYCERIDES: 107 mg/dL (ref 0–149)
VLDL CHOLESTEROL CAL: 21 mg/dL (ref 5–40)

## 2018-05-03 LAB — IRON,TIBC AND FERRITIN PANEL
Ferritin: 40 ng/mL (ref 15–150)
IRON SATURATION: 11 % — AB (ref 15–55)
Iron: 41 ug/dL (ref 27–159)
Total Iron Binding Capacity: 360 ug/dL (ref 250–450)
UIBC: 319 ug/dL (ref 131–425)

## 2018-05-03 LAB — TSH: TSH: 3.3 u[IU]/mL (ref 0.450–4.500)

## 2018-05-15 DIAGNOSIS — I10 Essential (primary) hypertension: Secondary | ICD-10-CM | POA: Diagnosis not present

## 2018-05-15 DIAGNOSIS — E119 Type 2 diabetes mellitus without complications: Secondary | ICD-10-CM | POA: Diagnosis not present

## 2018-05-15 DIAGNOSIS — Z0000 Encounter for general adult medical examination without abnormal findings: Secondary | ICD-10-CM | POA: Diagnosis not present

## 2018-05-15 DIAGNOSIS — G4733 Obstructive sleep apnea (adult) (pediatric): Secondary | ICD-10-CM | POA: Insufficient documentation

## 2018-05-15 DIAGNOSIS — Z9989 Dependence on other enabling machines and devices: Secondary | ICD-10-CM

## 2018-05-22 ENCOUNTER — Ambulatory Visit (INDEPENDENT_AMBULATORY_CARE_PROVIDER_SITE_OTHER): Payer: 59

## 2018-05-22 ENCOUNTER — Ambulatory Visit (INDEPENDENT_AMBULATORY_CARE_PROVIDER_SITE_OTHER): Payer: 59 | Admitting: Podiatry

## 2018-05-22 ENCOUNTER — Encounter: Payer: Self-pay | Admitting: Podiatry

## 2018-05-22 DIAGNOSIS — S9032XA Contusion of left foot, initial encounter: Secondary | ICD-10-CM | POA: Diagnosis not present

## 2018-05-22 DIAGNOSIS — S92215A Nondisplaced fracture of cuboid bone of left foot, initial encounter for closed fracture: Secondary | ICD-10-CM

## 2018-05-23 NOTE — Progress Notes (Signed)
Subjective:  Patient ID: Jacqueline Deleon, female    DOB: [birth year],  MRN: [id number] HPI Chief Complaint  Patient presents with  . Foot Pain    Patient presents today for injury to left foot 3 weeks ago.  She reports she was pushing a stool with her foot and hyperextended her foot.  Now she has been having a constant throbbing and aching pain with some swelling on lateral side of left foot   She has been wearing an ankle brace with not much relief    57 y.o. female presents with the above complaint.   ROS: Denies fever chills nausea vomiting muscle aches pains calf pain back pain chest pain shortness of breath headache.  Still works for [employer].  States that she injured this while at work 2 weeks ago Wednesday.  Past Medical History:  Diagnosis Date  . Arthritis   . Breast cancer ([place])    2009 infiltrating ductal cancer of right breast  . Breast mass 08/2006, 03/2009   left breast biopsy fibroadenoma (2008) right breast biopsy benign (2010)  . 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 Central serous retinopathy   . Fatty liver   . GERD (gastroesophageal reflux disease)   . Gestational diabetes   . Headache    migraines  . Heart murmur   . History of abnormal mammogram 08/2006, 01/2008, 03/2009  . Hypertension   . IBS (irritable bowel syndrome)   . Joint disorder    hx of left ankle tendonitis, bursitis, heel spur  . Obesity 2018   BMI 45  . Pelvic pain    adhesions and adenomyosis  . Rosacea    Past Surgical History:  Procedure Laterality Date  . BREAST BIOPSY Left 08/2006   benign fibroadenoma  . BREAST EXCISIONAL BIOPSY Right 02/26/2008   right breast invasive mam ca with rad partial mastectomy   . BREAST SURGERY Right    x2/ Dr Tamala Julian  . CARDIAC CATHETERIZATION  2006  . CESAREAN SECTION  04/02/1998  . CHOLECYSTECTOMY  04/2010   Dr. Smith/ laprascopic surgery  . COLONOSCOPY  04/04/2000  . ESOPHAGOGASTRODUODENOSCOPY (EGD) WITH PROPOFOL N/A  05/01/2015   Procedure: ESOPHAGOGASTRODUODENOSCOPY (EGD) WITH PROPOFOL;  Surgeon: Hulen Luster, MD;  Location: [facility] ENDOSCOPY;  Service: Gastroenterology;  Laterality: N/A;  . EYE SURGERY  08/2012   laser surgery on retina/ DR Appenzeller  . FINGER SURGERY     repair of tendon in right index, Dr. Francesco Sor in [place]  . FOOT SURGERY     Dr. Milinda Pointer  . IRRIGATION AND DEBRIDEMENT SEBACEOUS CYST     right upper back  . LAPAROSCOPIC SUPRACERVICAL HYSTERECTOMY  06/2007   Dr Kincius/ adhesions/CPP/adenomyosis  . MASTECTOMY PARTIAL / LUMPECTOMY Right 01/2008   with sentinal lymph node biopsy/ infiltrating ductal cancer  . SKIN CANCER EXCISION     back and calves  . WISDOM TOOTH EXTRACTION  1991    Current Outpatient Medications:  .  acetaminophen (TYLENOL) 325 MG tablet, Take 650 mg by mouth every 6 (six) hours as needed., Disp: , Rfl:  .  amLODipine (NORVASC) 10 MG tablet, Take 0.5 tablets (5 mg total) by mouth daily., Disp: 90 tablet, Rfl: 3 .  Ascorbic Acid (VITAMIN C) 1000 MG tablet, Take by mouth., Disp: , Rfl:  .  buPROPion (WELLBUTRIN XL) 150 MG 24 hr tablet, , Disp: , Rfl: 3 .  Camphor-Eucalyptus-Menthol (VICKS VAPORUB) 4.7-1.2-2.6 % OINT, , Disp: , Rfl:  .  citalopram (CELEXA) 40 MG tablet, , Disp: , Rfl: 1 .  clotrimazole (CLOTRIMAZOLE ANTI-FUNGAL) 1 % cream, Apply topically., Disp: , Rfl:  .  diphenhydrAMINE (BENADRYL) 25 MG tablet, Take by mouth., Disp: , Rfl:  .  hyoscyamine (LEVSIN, ANASPAZ) 0.125 MG tablet, , Disp: , Rfl: 3 .  ibuprofen (ADVIL,MOTRIN) 200 MG tablet, Take 200 mg by mouth every 6 (six) hours as needed., Disp: , Rfl:  .  ketotifen (ZADITOR) 0.025 % ophthalmic solution, Apply to eye., Disp: , Rfl:  .  lisinopril (PRINIVIL,ZESTRIL) 10 MG tablet, Take by mouth., Disp: , Rfl:  .  meclizine (ANTIVERT) 25 MG tablet, , Disp: , Rfl: 0 .  metFORMIN (GLUCOPHAGE-XR) 500 MG 24 hr tablet, , Disp: , Rfl:  .  metroNIDAZOLE (METROGEL) 0.75 % gel, , Disp: , Rfl:  .  nitroGLYCERIN  (NITROSTAT) 0.4 MG SL tablet, Place under the tongue., Disp: , Rfl:  .  omeprazole (PRILOSEC) 20 MG capsule, Take 20 mg by mouth daily., Disp: , Rfl:   Allergies  Allergen Reactions  . Kiwi Extract Itching    swelling swelling swelling  . Other Swelling    Grapes--itching  . Codeine Other (See Comments)    Felt funny.  . Amoxicillin Rash  . Erythromycin Rash  . Sulfa Antibiotics Rash  . Tape Rash   Review of Systems Objective:  There were no vitals filed for this visit.  General: Well developed, nourished, in no acute distress, alert and oriented x3   Dermatological: Skin is warm, dry and supple bilateral. Nails x 10 are well maintained; remaining integument appears unremarkable at this time. There are no open sores, no preulcerative lesions, no rash or signs of infection present.  Vascular: Dorsalis Pedis artery and Posterior Tibial artery pedal pulses are 2/4 bilateral with immedate capillary fill time. Pedal hair growth present. No varicosities and no lower extremity edema present bilateral.   Neruologic: Grossly intact via light touch bilateral. Vibratory intact via tuning fork bilateral. Protective threshold with Semmes Wienstein monofilament intact to all pedal sites bilateral. Patellar and Achilles deep tendon reflexes 2+ bilateral. No Babinski or clonus noted bilateral.   Musculoskeletal: No gross boney pedal deformities bilateral. No pain, crepitus, or limitation noted with foot and ankle range of motion bilateral. Muscular strength 5/5 in all groups tested bilateral.  She has pain on palpation of the cuboid bone there is no erythema edema cellulitis drainage or odor.  Gait: Unassisted, Nonantalgic.    Radiographs:  Radiographs taken today demonstrates a stress fracture or compression fracture of the cuboid left foot.  Assessment & Plan:   Assessment: Fracture cuboid.  Plan: Place her in a cam boot.     Joh Rao T. [place], [place]

## 2018-05-29 DIAGNOSIS — R51 Headache: Secondary | ICD-10-CM | POA: Diagnosis not present

## 2018-05-29 DIAGNOSIS — M5412 Radiculopathy, cervical region: Secondary | ICD-10-CM | POA: Diagnosis not present

## 2018-05-29 DIAGNOSIS — M545 Low back pain: Secondary | ICD-10-CM | POA: Diagnosis not present

## 2018-05-29 DIAGNOSIS — M546 Pain in thoracic spine: Secondary | ICD-10-CM | POA: Diagnosis not present

## 2018-05-29 DIAGNOSIS — M9901 Segmental and somatic dysfunction of cervical region: Secondary | ICD-10-CM | POA: Diagnosis not present

## 2018-05-29 DIAGNOSIS — M9904 Segmental and somatic dysfunction of sacral region: Secondary | ICD-10-CM | POA: Diagnosis not present

## 2018-05-29 DIAGNOSIS — M5387 Other specified dorsopathies, lumbosacral region: Secondary | ICD-10-CM | POA: Diagnosis not present

## 2018-05-29 DIAGNOSIS — M542 Cervicalgia: Secondary | ICD-10-CM | POA: Diagnosis not present

## 2018-05-29 DIAGNOSIS — M9903 Segmental and somatic dysfunction of lumbar region: Secondary | ICD-10-CM | POA: Diagnosis not present

## 2018-06-02 ENCOUNTER — Telehealth: Payer: Self-pay | Admitting: Podiatry

## 2018-06-02 NOTE — Telephone Encounter (Signed)
Pt was seen 11/11 and put in a brace for cracked bones in foot. Pt is experiencing pain now in the brace. Pt works on her feet all day in doctors office. What can she do to help with pain? Should she be staying off of her foot? Please give pt a call.

## 2018-06-02 NOTE — Telephone Encounter (Signed)
I returned patient call, she informed me that she has been on her foot a lot more than she should and is now hurting more in the boot.  I instructed her to rest her foot as much as she can, use ice, elevate and try 400mg  Ibuprofen to see if she can get any relief.  She will call back next week if she continues to have pain.

## 2018-06-13 ENCOUNTER — Telehealth: Payer: Self-pay | Admitting: Podiatry

## 2018-06-13 NOTE — Telephone Encounter (Signed)
"  I spoke with the Nurse last week and she told me to ice my foot and take some medication, and it still has not gotten any better." " She told me to call her back."

## 2018-06-13 NOTE — Telephone Encounter (Signed)
I returned patient call.  She states that she is still having a lot pain, she still feels a knot of the lateral side of her left foot and Ibuprofen is giving only slight relief.   She is unable to take Tylenol due to liver concerns.   She was icing but did not get any relief and she cant be out of work.   She is wanting to know what other recommendation you can give her for pain relief.  I had mentioned to her about taking Meloxicam and she said that she took in the past with relief.  She was also wondering if you wanted to get an MRI of her foot to see why its still painful.   Please advise

## 2018-06-14 NOTE — Telephone Encounter (Signed)
Is she wearing her cam walker?  You can request MRI but has not been treated long enough to get one I don't think.

## 2018-06-14 NOTE — Telephone Encounter (Signed)
I spoke with patient and informed her that per Dr. Milinda Pointer, he thinks getting an MRI is too soon.  She did state that she has been wearing her cam boot all the time and she started soaking in Epson salt last night which is given temporary relief.  She has follow up appt with on 06/19/18 and will discuss other treatment options then

## 2018-06-19 ENCOUNTER — Ambulatory Visit (INDEPENDENT_AMBULATORY_CARE_PROVIDER_SITE_OTHER): Payer: 59

## 2018-06-19 ENCOUNTER — Encounter: Payer: Self-pay | Admitting: Podiatry

## 2018-06-19 ENCOUNTER — Ambulatory Visit (INDEPENDENT_AMBULATORY_CARE_PROVIDER_SITE_OTHER): Payer: 59 | Admitting: Podiatry

## 2018-06-19 DIAGNOSIS — S92215D Nondisplaced fracture of cuboid bone of left foot, subsequent encounter for fracture with routine healing: Secondary | ICD-10-CM

## 2018-06-19 NOTE — Progress Notes (Signed)
She presents today for follow-up of her fracture cuboid left.  She states that is feeling a lot better she states that it is hard to quantify but may be 30 to 50% improvement.  Objective: Vital signs are stable she is alert and oriented x3 she still has some tenderness on palpation of the cuboid itself though range of motion and tendon function does not appear to worsen her symptoms.  Assessment: Fracture cuboid left.  Plan: I want her to wear the cam walker for another 3 weeks if not improved considerably by that time we will consider an MRI or CT.

## 2018-06-26 DIAGNOSIS — M545 Low back pain: Secondary | ICD-10-CM | POA: Diagnosis not present

## 2018-06-26 DIAGNOSIS — M9902 Segmental and somatic dysfunction of thoracic region: Secondary | ICD-10-CM | POA: Diagnosis not present

## 2018-06-26 DIAGNOSIS — R51 Headache: Secondary | ICD-10-CM | POA: Diagnosis not present

## 2018-06-26 DIAGNOSIS — M9904 Segmental and somatic dysfunction of sacral region: Secondary | ICD-10-CM | POA: Diagnosis not present

## 2018-06-26 DIAGNOSIS — M5412 Radiculopathy, cervical region: Secondary | ICD-10-CM | POA: Diagnosis not present

## 2018-06-26 DIAGNOSIS — M9901 Segmental and somatic dysfunction of cervical region: Secondary | ICD-10-CM | POA: Diagnosis not present

## 2018-06-26 DIAGNOSIS — M9903 Segmental and somatic dysfunction of lumbar region: Secondary | ICD-10-CM | POA: Diagnosis not present

## 2018-06-26 DIAGNOSIS — M542 Cervicalgia: Secondary | ICD-10-CM | POA: Diagnosis not present

## 2018-06-26 DIAGNOSIS — M6080 Other myositis, unspecified site: Secondary | ICD-10-CM | POA: Diagnosis not present

## 2018-07-17 ENCOUNTER — Ambulatory Visit (INDEPENDENT_AMBULATORY_CARE_PROVIDER_SITE_OTHER): Payer: 59

## 2018-07-17 ENCOUNTER — Encounter: Payer: Self-pay | Admitting: Podiatry

## 2018-07-17 ENCOUNTER — Ambulatory Visit (INDEPENDENT_AMBULATORY_CARE_PROVIDER_SITE_OTHER): Payer: 59 | Admitting: Podiatry

## 2018-07-17 DIAGNOSIS — S92215D Nondisplaced fracture of cuboid bone of left foot, subsequent encounter for fracture with routine healing: Secondary | ICD-10-CM | POA: Diagnosis not present

## 2018-07-17 NOTE — Progress Notes (Signed)
Presents today states that it feels about 50% improved.  Still has good and bad days continue to wear the cam walker regularly.  Objective: Vital signs stable alert and oriented x3 patient decrease in edema to the left foot.  Still has some tenderness on palpation of the cuboid.  Radiographs demonstrate a hairline fracture of the cuboid appears to be healing.  Assessment: Well-healing fracture cuboid left.  Plan: Recommended she transition between her cam walker and her regular tennis shoe.  We discussed the fact that she needs a straight last tennis shoe.  Follow-up with her in 6 weeks.

## 2018-07-19 ENCOUNTER — Ambulatory Visit (INDEPENDENT_AMBULATORY_CARE_PROVIDER_SITE_OTHER): Payer: 59 | Admitting: Podiatry

## 2018-07-19 ENCOUNTER — Ambulatory Visit (INDEPENDENT_AMBULATORY_CARE_PROVIDER_SITE_OTHER): Payer: 59

## 2018-07-19 ENCOUNTER — Encounter: Payer: Self-pay | Admitting: Podiatry

## 2018-07-19 DIAGNOSIS — S9032XA Contusion of left foot, initial encounter: Secondary | ICD-10-CM

## 2018-07-19 DIAGNOSIS — S86312A Strain of muscle(s) and tendon(s) of peroneal muscle group at lower leg level, left leg, initial encounter: Secondary | ICD-10-CM | POA: Diagnosis not present

## 2018-07-19 NOTE — Progress Notes (Signed)
She presents today after having stepping hard on her left foot she states that a step down really hard not using the cam walker using her regular tennis shoes as I had instructed and she is to experience sharp pain around the cuboid and radiating up her leg.  Objective: Vital signs are stable alert and oriented x3.  Pulses are palpable.  Radiographs taken today demonstrate what appears to be an os peroneum that has retracted more.  More than likely she has a tear or is tearing the peroneus longus tendon.  She still has tenderness on palpation of the cuboid.  Still cannot rule out stress fracture of the cuboid.  Assessment: Probable tear of the peroneal tendons left.  Plan: Follow-up with her after her MRI.

## 2018-07-27 ENCOUNTER — Encounter: Payer: Self-pay | Admitting: Podiatry

## 2018-07-27 ENCOUNTER — Ambulatory Visit
Admission: RE | Admit: 2018-07-27 | Discharge: 2018-07-27 | Disposition: A | Discharge status: Home / Self Care | Payer: 59 | Source: Ambulatory Visit | Attending: Podiatry | Admitting: Podiatry

## 2018-07-27 DIAGNOSIS — S86312A Strain of muscle(s) and tendon(s) of peroneal muscle group at lower leg level, left leg, initial encounter: Secondary | ICD-10-CM | POA: Diagnosis not present

## 2018-07-27 DIAGNOSIS — M7672 Peroneal tendinitis, left leg: Secondary | ICD-10-CM | POA: Diagnosis not present

## 2018-07-31 ENCOUNTER — Encounter: Payer: Self-pay | Admitting: Podiatry

## 2018-08-01 ENCOUNTER — Telehealth: Payer: Self-pay | Admitting: *Deleted

## 2018-08-01 ENCOUNTER — Ambulatory Visit: Payer: 59

## 2018-08-01 NOTE — Telephone Encounter (Signed)
-----   Message from Garrel Ridgel, [place] sent at 07/31/2018  7:32 AM EST ----- Please send for over read and inform patient of the delay thanks.

## 2018-08-01 NOTE — Telephone Encounter (Signed)
Left message informing pt Dr. Milinda Pointer reviewed results and request to send copy of MRI disc to radiology specialist for more indepth information for treatment planning and there would be a 10 - 14 day delay in resultls, we would call with instructions once received.

## 2018-08-01 NOTE — Telephone Encounter (Signed)
Faxed request for MRI disc copy to [facility]. 

## 2018-08-04 NOTE — Telephone Encounter (Signed)
Received copy of MRI disc and mailed to [facility]. 

## 2018-08-07 DIAGNOSIS — M9904 Segmental and somatic dysfunction of sacral region: Secondary | ICD-10-CM | POA: Diagnosis not present

## 2018-08-07 DIAGNOSIS — M9902 Segmental and somatic dysfunction of thoracic region: Secondary | ICD-10-CM | POA: Diagnosis not present

## 2018-08-07 DIAGNOSIS — M545 Low back pain: Secondary | ICD-10-CM | POA: Diagnosis not present

## 2018-08-07 DIAGNOSIS — M9901 Segmental and somatic dysfunction of cervical region: Secondary | ICD-10-CM | POA: Diagnosis not present

## 2018-08-07 DIAGNOSIS — M542 Cervicalgia: Secondary | ICD-10-CM | POA: Diagnosis not present

## 2018-08-07 DIAGNOSIS — M5412 Radiculopathy, cervical region: Secondary | ICD-10-CM | POA: Diagnosis not present

## 2018-08-07 DIAGNOSIS — R51 Headache: Secondary | ICD-10-CM | POA: Diagnosis not present

## 2018-08-07 DIAGNOSIS — M6080 Other myositis, unspecified site: Secondary | ICD-10-CM | POA: Diagnosis not present

## 2018-08-07 DIAGNOSIS — M9903 Segmental and somatic dysfunction of lumbar region: Secondary | ICD-10-CM | POA: Diagnosis not present

## 2018-08-08 ENCOUNTER — Encounter: Payer: Self-pay | Admitting: Podiatry

## 2018-08-11 ENCOUNTER — Encounter: Payer: Self-pay | Admitting: Podiatry

## 2018-08-14 ENCOUNTER — Encounter: Payer: Self-pay | Admitting: Podiatry

## 2018-08-16 ENCOUNTER — Ambulatory Visit: Payer: 59

## 2018-08-16 ENCOUNTER — Encounter: Payer: Self-pay | Admitting: Podiatry

## 2018-08-16 ENCOUNTER — Ambulatory Visit: Payer: 59 | Admitting: Podiatry

## 2018-08-16 ENCOUNTER — Ambulatory Visit (INDEPENDENT_AMBULATORY_CARE_PROVIDER_SITE_OTHER): Payer: 59 | Admitting: Podiatry

## 2018-08-16 DIAGNOSIS — S92215D Nondisplaced fracture of cuboid bone of left foot, subsequent encounter for fracture with routine healing: Secondary | ICD-10-CM

## 2018-08-17 NOTE — Progress Notes (Signed)
She presents today states that my right foot is still hurting is really about the same as it was.  Objective: Vital signs are stable she is alert and oriented x3.  Pulses are palpable.  She has pain on palpation to the cuboid area of her left foot.  She has pain on abduction against resistance and plantarflexion and eversion against resistance.  There is no fluctuance in the soft tissue.  MRI read does demonstrate a thin soft tissue/tendon tear distal to the peroneal tubercle though she does have pain proximal to that as well.  She also has tenosynovitis of this peroneus longus tendon.  Assessment: Erroneous longus tendons tear with tenosynovitis.  Plan: At this point I highly recommend physical therapy prior to any type of surgical intervention.  She has family issues at this point that inhibit any of the surgical options.

## 2018-08-18 ENCOUNTER — Encounter: Payer: Self-pay | Admitting: Physical Therapy

## 2018-08-21 ENCOUNTER — Encounter: Payer: Self-pay | Admitting: Physical Therapy

## 2018-08-21 ENCOUNTER — Ambulatory Visit: Payer: 59 | Attending: Podiatry | Admitting: Physical Therapy

## 2018-08-21 ENCOUNTER — Other Ambulatory Visit: Payer: Self-pay

## 2018-08-21 DIAGNOSIS — R262 Difficulty in walking, not elsewhere classified: Secondary | ICD-10-CM | POA: Diagnosis not present

## 2018-08-21 DIAGNOSIS — M6281 Muscle weakness (generalized): Secondary | ICD-10-CM

## 2018-08-21 NOTE — Therapy (Addendum)
[place] MAIN [facility] SERVICES [address] [place], [place], [address] Phone: [phone]   Fax:  [phone]  Physical Therapy Evaluation  Patient Details  Name: NAKIYA RALLIS MRN: [id number] Date of Birth: 07/02/1961 Referring Provider (PT): Derinda Late   Encounter Date: 08/21/2018  PT End of Session - 08/21/18 1652    Visit Number  1    Number of Visits  17    Date for PT Re-Evaluation  10/16/18    PT Start Time  0430    PT Stop Time  0530    PT Time Calculation (min)  60 min    Activity Tolerance  Patient tolerated treatment well;Patient limited by pain    Behavior During Therapy  [facility] for tasks assessed/performed       Past Medical History:  Diagnosis Date  . Arthritis   . 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 Breast mass 08/2006, 03/2009   left breast biopsy fibroadenoma (2008) right breast biopsy benign (2010)  . 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 Central serous retinopathy   . Fatty liver   . GERD (gastroesophageal reflux disease)   . Gestational diabetes   . Headache    migraines  . Heart murmur   . History of abnormal mammogram 08/2006, 01/2008, 03/2009  . Hypertension   . IBS (irritable bowel syndrome)   . Joint disorder    hx of left ankle tendonitis, bursitis, heel spur  . Obesity 2018   BMI 45  . Pelvic pain    adhesions and adenomyosis  . Rosacea     Past Surgical History:  Procedure Laterality Date  . BREAST BIOPSY Left 08/2006   benign fibroadenoma  . BREAST EXCISIONAL BIOPSY Right 02/26/2008   right breast invasive mam ca with rad partial mastectomy   . BREAST SURGERY Right    x2/ Dr Tamala Julian  . CARDIAC CATHETERIZATION  2006  . CESAREAN SECTION  04/02/1998  . CHOLECYSTECTOMY  04/2010   Dr. Smith/ laprascopic surgery  . COLONOSCOPY  04/04/2000  . ESOPHAGOGASTRODUODENOSCOPY (EGD) WITH PROPOFOL N/A 05/01/2015   Procedure:  ESOPHAGOGASTRODUODENOSCOPY (EGD) WITH PROPOFOL;  Surgeon: Hulen Luster, MD;  Location: [facility] ENDOSCOPY;  Service: Gastroenterology;  Laterality: N/A;  . EYE SURGERY  08/2012   laser surgery on retina/ DR Appenzeller  . FINGER SURGERY     repair of tendon in right index, Dr. Francesco Sor in [place]  . FOOT SURGERY     Dr. Milinda Pointer  . IRRIGATION AND DEBRIDEMENT SEBACEOUS CYST     right upper back  . LAPAROSCOPIC SUPRACERVICAL HYSTERECTOMY  06/2007   Dr Kincius/ adhesions/CPP/adenomyosis  . MASTECTOMY PARTIAL / LUMPECTOMY Right 01/2008   with sentinal lymph node biopsy/ infiltrating ductal cancer  . SKIN CANCER EXCISION     back and calves  . WISDOM TOOTH EXTRACTION  1991    There were no vitals filed for this visit.   Subjective Assessment - 08/21/18 1643    Subjective  Patient reports that she is having difficulty with intermediate distances and long distance walking.  She is not able to stand very long. She is always wearing the shoe/cast. She is having left ankle pain 1/10 in sitting and pain varies depending on the position. Her pain level hightest is 7/10 and the lowest is 0/10.     Pertinent History Patient fractured  left foot cuboid 05/08/18; she was pushing a stool  with her foot and hyperextended her foot.       Patient reports 07/19/18  after having stepped hard on her left foot  not using the cam walker using her regular tennis shoes she had a sharp pain around the cuboid and radiating up her leg.  Radiographs show a  tear in the peroneus longus tendon.  She has tenderness on palpation of the cuboid.     How long can you walk comfortably?  5-10 mins standing and waking    Patient Stated Goals  to walk and stand without pain.     Currently in Pain?  Yes    Pain Score  1     Pain Location  Foot    Pain Orientation  Left    Pain Descriptors / Indicators  Aching    Pain Type  Chronic pain         [facility] PT Assessment - 08/21/18 1647      Assessment   Referring Provider (PT)   Derinda Late    Onset Date/Surgical Date  05/09/19    Hand Dominance  Right    Prior Therapy  no      Precautions   Precautions  --   wear the cast shoe   Required Braces or Orthoses  --   ankle / foot shoe     Restrictions   Weight Bearing Restrictions  No      Balance Screen   Has the patient fallen in the past 6 months  No    Has the patient had a decrease in activity level because of a fear of falling?   Yes    Is the patient reluctant to leave their home because of a fear of falling?   Yes      [place]  Private residence    Living Arrangements  Spouse/significant other    Available Help at Discharge  Family    Type of Gas to enter    Entrance Stairs-Number of Steps  3    Entrance Stairs-Rails  None    Home Layout  One level    [place]  None      Prior Function   Level of Independence  Independent;Independent with household mobility without device;Independent with community mobility without device;Independent with homemaking with ambulation    Vocation Requirements  --   sitting   Leisure  TV, crafts, read      Cognition   Overall Cognitive Status  Within Functional Limits for tasks assessed        PAIN:  Intermittent pain ranges from 0/10- 7/10 left ankle   POSTURE: WNL   PROM/AROM: ROM is WFL left ankle DF/PF, INV/EVE  STRENGTH:  Graded on a 0-5 scale Muscle Group Left Right                                              Ankle Inv 5/5   Ankle Eve 3/5*   Ankle DF 5/5   Ankle PF 3/5*    SENSATION: no reports of numbness or tingling left ankle    FUNCTIONAL MOBILITY: independent transfers with use of UE   Balance : WFL    GAIT:  Decreased gait speed with left CAM boot 1.0 miles/ hour  OUTCOME MEASURES: TEST Outcome Interpretation  10 meter walk test   1.0              m/s <1.0 m/s indicates increased risk for falls; limited community ambulator      6 minute  walk test   1325             Feet 1000 feet is community ambulator            Treatment: Foot scrunch on wash cloth x 5 mins RTB DF, PF x 20  No reports of pain and min VC for correct technique       Objective measurements completed on examination: See above findings.              PT Education - 08/21/18 1651    Education Details  plan of care    Person(s) Educated  Patient    Methods  Explanation    Comprehension  Verbalized understanding       PT Short Term Goals - 08/22/18 0916      PT SHORT TERM GOAL #1   Title  Patient will report a worst pain of 3/10 on VAS in left ankle to improve tolerance with ADLs and reduced symptoms with activities.     Time  4    Period  Weeks    Status  New    Target Date  09/19/18      PT SHORT TERM GOAL #2   Title  Patient will increase BLE gross strength to 3+/5, no pain as to improve functional strength for independent gait, increased standing tolerance and increased ADL ability.    Time  4    Period  Weeks    Status  New    Target Date  09/19/18        PT Long Term Goals - 08/22/18 3810      PT LONG TERM GOAL #1   Title  Patient will increase lower extremity functional scale to >60/80 to demonstrate improved functional mobility and increased tolerance with ADLs.     Time  8    Period  Weeks    Status  New    Target Date  10/16/18      PT LONG TERM GOAL #2   Title  Patient will report a worst pain of 1/10 on VAS in  left ankle  to improve tolerance with ADLs and reduced symptoms with activities.     Time  8    Period  Weeks    Status  New    Target Date  10/16/18      PT LONG TERM GOAL #3   Title  Patient will increase six minute walk test distance to >2000 for progression to community ambulator and improve gait ability    Time  8    Period  Weeks    Status  New    Target Date  10/16/18             Plan - 08/22/18 0909    Clinical Impression Statement  Patient is 58 yr old female with dx of  peroneal longus tendon tear left Closed nondisplaced fracture of cuboid of left foot. She ambulates without AD and cam boot on LLE. She has decreased gait speed, and left foot weakness and pain with eversion and weakness in PF. She has swelling in left ankle and tenderness to palpation lateral malleoli and lateral 5th MTand posterior foot. She has pain during ambulation and standing and has decreased tolerance to ambulation and standing.  She will benefit from skilled PT to improve strength, decrease pain and improve mobility.     Clinical Decision Making  Low    Rehab Potential  Fair    PT Frequency  2x / week    PT Duration  8 weeks    PT Treatment/Interventions  Electrical Stimulation;Aquatic Therapy;Cryotherapy;Moist Heat;Ultrasound;Gait training;Therapeutic activities;Therapeutic exercise;Patient/family education;Neuromuscular re-education;Manual techniques;Passive range of motion;Dry needling;Splinting;Taping    PT Next Visit Plan  [place], e-stim, strengthening left ankle    Consulted and Agree with Plan of Care  Patient       Patient will benefit from skilled therapeutic intervention in order to improve the following deficits and impairments:  Abnormal gait, Decreased endurance, Decreased mobility, Difficulty walking, Obesity, Decreased activity tolerance, Decreased strength, Impaired flexibility  Visit Diagnosis: Muscle weakness (generalized)  Difficulty in walking, not elsewhere classified     Problem List Patient Active Problem List   Diagnosis Date Noted  . OSA on CPAP 05/15/2018  . Non-alcoholic fatty liver disease 03/06/2017  . Rosacea   . GERD (gastroesophageal reflux disease)   . Central serous retinopathy   . IBS (irritable bowel syndrome)   . Hyperlipidemia, mixed 01/24/2017  . Dyspnea on effort 12/22/2016  . Anxiety, generalized 04/29/2016  . Diabetes mellitus without complication ([place]) 08/67/6195  . Cancer of the skin, basal cell 03/12/2016  . Contact dermatitis  04/23/2013  . Nodule, subcutaneous 07/24/2012  . Localized swelling, mass and lump, unspecified 07/24/2012  . Hypertension 12/20/2011  . Personal history of breast cancer 12/20/2011  . Obesity 12/20/2011    Alanson Puls, PT DPT 08/22/2018, 12:32 PM  [place] MAIN [facility] SERVICES [address] [place], [place], [address] Phone: [phone]   Fax:  [phone]  Name: JAZALYN MONDOR MRN: [id number] Date of Birth: Jan 27, 1961

## 2018-08-23 ENCOUNTER — Ambulatory Visit: Payer: 59 | Admitting: Physical Therapy

## 2018-08-23 ENCOUNTER — Encounter: Payer: Self-pay | Admitting: Physical Therapy

## 2018-08-23 DIAGNOSIS — M6281 Muscle weakness (generalized): Secondary | ICD-10-CM

## 2018-08-23 DIAGNOSIS — R262 Difficulty in walking, not elsewhere classified: Secondary | ICD-10-CM | POA: Diagnosis not present

## 2018-08-23 NOTE — Therapy (Signed)
[place] MAIN [facility] SERVICES [address] [place], [place], [address] Phone: [phone]   Fax:  [phone]  Physical Therapy Treatment  Patient Details  Name: Jacqueline Deleon MRN: [id number] Date of Birth: Nov 19, 1960 Referring Provider (PT): Derinda Late   Encounter Date: 08/23/2018  PT End of Session - 08/23/18 1709    Visit Number  2    Number of Visits  17    Date for PT Re-Evaluation  10/16/18    PT Start Time  0445    PT Stop Time  0525    PT Time Calculation (min)  40 min    Activity Tolerance  Patient tolerated treatment well;Patient limited by pain    Behavior During Therapy  [facility] for tasks assessed/performed       Past Medical History:  Diagnosis Date  . Arthritis   . 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 Breast mass 08/2006, 03/2009   left breast biopsy fibroadenoma (2008) right breast biopsy benign (2010)  . 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 Central serous retinopathy   . Fatty liver   . GERD (gastroesophageal reflux disease)   . Gestational diabetes   . Headache    migraines  . Heart murmur   . History of abnormal mammogram 08/2006, 01/2008, 03/2009  . Hypertension   . IBS (irritable bowel syndrome)   . Joint disorder    hx of left ankle tendonitis, bursitis, heel spur  . Obesity 2018   BMI 45  . Pelvic pain    adhesions and adenomyosis  . Rosacea     Past Surgical History:  Procedure Laterality Date  . BREAST BIOPSY Left 08/2006   benign fibroadenoma  . BREAST EXCISIONAL BIOPSY Right 02/26/2008   right breast invasive mam ca with rad partial mastectomy   . BREAST SURGERY Right    x2/ Dr Tamala Julian  . CARDIAC CATHETERIZATION  2006  . CESAREAN SECTION  04/02/1998  . CHOLECYSTECTOMY  04/2010   Dr. Smith/ laprascopic surgery  . COLONOSCOPY  04/04/2000  . ESOPHAGOGASTRODUODENOSCOPY (EGD) WITH PROPOFOL N/A 05/01/2015   Procedure:  ESOPHAGOGASTRODUODENOSCOPY (EGD) WITH PROPOFOL;  Surgeon: Hulen Luster, MD;  Location: [facility] ENDOSCOPY;  Service: Gastroenterology;  Laterality: N/A;  . EYE SURGERY  08/2012   laser surgery on retina/ DR Appenzeller  . FINGER SURGERY     repair of tendon in right index, Dr. Francesco Sor in [place]  . FOOT SURGERY     Dr. Milinda Pointer  . IRRIGATION AND DEBRIDEMENT SEBACEOUS CYST     right upper back  . LAPAROSCOPIC SUPRACERVICAL HYSTERECTOMY  06/2007   Dr Kincius/ adhesions/CPP/adenomyosis  . MASTECTOMY PARTIAL / LUMPECTOMY Right 01/2008   with sentinal lymph node biopsy/ infiltrating ductal cancer  . SKIN CANCER EXCISION     back and calves  . WISDOM TOOTH EXTRACTION  1991    There were no vitals filed for this visit.  Subjective Assessment - 08/23/18 1706    Subjective  Patient fractured  left foot cuboid 05/08/18; she was pushing a stool with her foot and hyperextended her foot.   She had no pain all day at work due to sitting down at her job, and then walking into clinic it increased to 4/10.     Pertinent History   Patient reports 07/19/2018 after having stepped hard on her left foot  not using the cam walker using her regular tennis shoes  she had a sharp pain around the cuboid and radiating up her leg.  Radiographs show a  tear in the peroneus longus tendon.  She has tenderness on palpation of the cuboid    How long can you walk comfortably?  5-10 mins standing and waking    Patient Stated Goals  to walk and stand without pain.     Currently in Pain?  Yes    Pain Score  4     Pain Location  Ankle    Pain Orientation  Left    Pain Descriptors / Indicators  Aching    Pain Type  Chronic pain    Pain Radiating Towards  no    Aggravating Factors   walking in cam boot    Pain Relieving Factors  rest    Effect of Pain on Daily Activities  unable to be as active        Treatment: [place] 1. 3 cm squared x 10 mins  Seated Baps board CW, CCW, DF/PF, inv/eve x 20  theraband DF, PF, inv x 20    scruch a towell x 1 min  Heel raises seated x 20  Pain increased during treatment and recommended heat and ice at home VC for correct exercise technique.                       PT Education - 08/23/18 1709    Education Details  HEP    Person(s) Educated  Patient    Methods  Explanation    Comprehension  Verbalized understanding;Returned demonstration;Need further instruction       PT Short Term Goals - 08/22/18 0916      PT SHORT TERM GOAL #1   Title  Patient will report a worst pain of 3/10 on VAS in left ankle to improve tolerance with ADLs and reduced symptoms with activities.     Time  4    Period  Weeks    Status  New    Target Date  09/19/18      PT SHORT TERM GOAL #2   Title  Patient will increase BLE gross strength to 3+/5, no pain as to improve functional strength for independent gait, increased standing tolerance and increased ADL ability.    Time  4    Period  Weeks    Status  New    Target Date  09/19/18        PT Long Term Goals - 08/22/18 6761      PT LONG TERM GOAL #1   Title  Patient will increase lower extremity functional scale to >60/80 to demonstrate improved functional mobility and increased tolerance with ADLs.     Time  8    Period  Weeks    Status  New    Target Date  10/16/18      PT LONG TERM GOAL #2   Title  Patient will report a worst pain of 1/10 on VAS in  left ankle  to improve tolerance with ADLs and reduced symptoms with activities.     Time  8    Period  Weeks    Status  New    Target Date  10/16/18      PT LONG TERM GOAL #3   Title  Patient will increase six minute walk test distance to >2000 for progression to community ambulator and improve gait ability    Time  8    Period  Weeks    Status  New    Target Date  10/16/18            Plan - 08/23/18 1710    Clinical Impression Statement  Patient has pain 4/10 at beginning of treatment and the pain increases with exercises. She performs seated  baps board AROM exercises and RROM RTB DF and inversion to left ankle. She had [place] to left ankle to reduce swelling and decrease her pain . She continues to benefit from skilled PT to improve mobility and decrease her pain.     Rehab Potential  Fair    PT Frequency  2x / week    PT Duration  8 weeks    PT Treatment/Interventions  Electrical Stimulation;Aquatic Therapy;Cryotherapy;Moist Heat;Ultrasound;Gait training;Therapeutic activities;Therapeutic exercise;Patient/family education;Neuromuscular re-education;Manual techniques;Passive range of motion;Dry needling;Splinting;Taping    PT Next Visit Plan  [place], e-stim, strengthening left ankle    Consulted and Agree with Plan of Care  Patient       Patient will benefit from skilled therapeutic intervention in order to improve the following deficits and impairments:  Abnormal gait, Decreased endurance, Decreased mobility, Difficulty walking, Obesity, Decreased activity tolerance, Decreased strength, Impaired flexibility  Visit Diagnosis: Muscle weakness (generalized)  Difficulty in walking, not elsewhere classified     Problem List Patient Active Problem List   Diagnosis Date Noted  . OSA on CPAP 05/15/2018  . Non-alcoholic fatty liver disease 03/06/2017  . Rosacea   . GERD (gastroesophageal reflux disease)   . Central serous retinopathy   . IBS (irritable bowel syndrome)   . Hyperlipidemia, mixed 01/24/2017  . Dyspnea on effort 12/22/2016  . Anxiety, generalized 04/29/2016  . Diabetes mellitus without complication ([place]) 00/17/4944  . Cancer of the skin, basal cell 03/12/2016  . Contact dermatitis 04/23/2013  . Nodule, subcutaneous 07/24/2012  . Localized swelling, mass and lump, unspecified 07/24/2012  . Hypertension 12/20/2011  . Personal history of breast cancer 12/20/2011  . Obesity 12/20/2011    Alanson Puls, PT DPT 08/23/2018, 5:15 PM  [place] MAIN [facility] SERVICES [address] [place], [place], [address] Phone: [phone]   Fax:  [phone]  Name: ASHLING ROANE MRN: [id number] Date of Birth: 1960-11-15

## 2018-08-28 ENCOUNTER — Ambulatory Visit: Payer: 59 | Admitting: Physical Therapy

## 2018-08-28 ENCOUNTER — Encounter: Payer: Self-pay | Admitting: Physical Therapy

## 2018-08-28 DIAGNOSIS — M6281 Muscle weakness (generalized): Secondary | ICD-10-CM | POA: Diagnosis not present

## 2018-08-28 DIAGNOSIS — R262 Difficulty in walking, not elsewhere classified: Secondary | ICD-10-CM

## 2018-08-28 NOTE — Therapy (Signed)
[place] MAIN [facility] SERVICES [address] [place], [place], [address] Phone: [phone]   Fax:  [phone]  Physical Therapy Treatment  Patient Details  Name: Jacqueline Deleon MRN: [id number] Date of Birth: November 04, 1960 Referring Provider (PT): Derinda Late   Encounter Date: 08/28/2018  PT End of Session - 08/28/18 1726    Visit Number  3    Number of Visits  17    Date for PT Re-Evaluation  10/16/18    PT Start Time  0445    PT Stop Time  0530    PT Time Calculation (min)  45 min    Activity Tolerance  Patient tolerated treatment well;Patient limited by pain    Behavior During Therapy  [facility] for tasks assessed/performed       Past Medical History:  Diagnosis Date  . Arthritis   . 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 Breast mass 08/2006, 03/2009   left breast biopsy fibroadenoma (2008) right breast biopsy benign (2010)  . 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 Central serous retinopathy   . Fatty liver   . GERD (gastroesophageal reflux disease)   . Gestational diabetes   . Headache    migraines  . Heart murmur   . History of abnormal mammogram 08/2006, 01/2008, 03/2009  . Hypertension   . IBS (irritable bowel syndrome)   . Joint disorder    hx of left ankle tendonitis, bursitis, heel spur  . Obesity 2018   BMI 45  . Pelvic pain    adhesions and adenomyosis  . Rosacea     Past Surgical History:  Procedure Laterality Date  . BREAST BIOPSY Left 08/2006   benign fibroadenoma  . BREAST EXCISIONAL BIOPSY Right 02/26/2008   right breast invasive mam ca with rad partial mastectomy   . BREAST SURGERY Right    x2/ Dr Tamala Julian  . CARDIAC CATHETERIZATION  2006  . CESAREAN SECTION  04/02/1998  . CHOLECYSTECTOMY  04/2010   Dr. Smith/ laprascopic surgery  . COLONOSCOPY  04/04/2000  . ESOPHAGOGASTRODUODENOSCOPY (EGD) WITH PROPOFOL N/A 05/01/2015   Procedure:  ESOPHAGOGASTRODUODENOSCOPY (EGD) WITH PROPOFOL;  Surgeon: Hulen Luster, MD;  Location: [facility] ENDOSCOPY;  Service: Gastroenterology;  Laterality: N/A;  . EYE SURGERY  08/2012   laser surgery on retina/ DR Appenzeller  . FINGER SURGERY     repair of tendon in right index, Dr. Francesco Sor in [place]  . FOOT SURGERY     Dr. Milinda Pointer  . IRRIGATION AND DEBRIDEMENT SEBACEOUS CYST     right upper back  . LAPAROSCOPIC SUPRACERVICAL HYSTERECTOMY  06/2007   Dr Kincius/ adhesions/CPP/adenomyosis  . MASTECTOMY PARTIAL / LUMPECTOMY Right 01/2008   with sentinal lymph node biopsy/ infiltrating ductal cancer  . SKIN CANCER EXCISION     back and calves  . WISDOM TOOTH EXTRACTION  1991    There were no vitals filed for this visit.  Subjective Assessment - 08/28/18 1725    Subjective  Patient is having 4/10 pain to left ankle and continues to wear her CAM boot.     Pertinent History   Patient reports 07/19/2018 after having stepped hard on her left foot  not using the cam walker using her regular tennis shoes she had a sharp pain around the cuboid and radiating up her leg.  Radiographs show a  tear in the peroneus longus tendon.  She has tenderness on palpation  of the cuboid    How long can you walk comfortably?  5-10 mins standing and waking    Patient Stated Goals  to walk and stand without pain.     Currently in Pain?  Yes    Pain Score  4     Pain Location  Foot    Pain Orientation  Left    Pain Descriptors / Indicators  Aching    Pain Type  Chronic pain    Pain Onset  More than a month ago    Pain Frequency  Intermittent    Multiple Pain Sites  No         Treatment:  [place] 1. 3 cm squared x 10 mins,  to left ankle lateral 5th MT and plantar foot and lateral ankle   Manual therapy: STM to left ankle lateral 5th MT and plantar foot and lateral ankle   E stim IFC x 20 mins at intensity 11 crossed pattern to left ankle  Patient has no pain following treatment and no pain during  treatment                          PT Education - 08/28/18 1726    Education Details  HEP    Person(s) Educated  Patient    Methods  Explanation    Comprehension  Verbalized understanding;Returned demonstration;Need further instruction       PT Short Term Goals - 08/22/18 0916      PT SHORT TERM GOAL #1   Title  Patient will report a worst pain of 3/10 on VAS in left ankle to improve tolerance with ADLs and reduced symptoms with activities.     Time  4    Period  Weeks    Status  New    Target Date  09/19/18      PT SHORT TERM GOAL #2   Title  Patient will increase BLE gross strength to 3+/5, no pain as to improve functional strength for independent gait, increased standing tolerance and increased ADL ability.    Time  4    Period  Weeks    Status  New    Target Date  09/19/18        PT Long Term Goals - 08/22/18 1245      PT LONG TERM GOAL #1   Title  Patient will increase lower extremity functional scale to >60/80 to demonstrate improved functional mobility and increased tolerance with ADLs.     Time  8    Period  Weeks    Status  New    Target Date  10/16/18      PT LONG TERM GOAL #2   Title  Patient will report a worst pain of 1/10 on VAS in  left ankle  to improve tolerance with ADLs and reduced symptoms with activities.     Time  8    Period  Weeks    Status  New    Target Date  10/16/18      PT LONG TERM GOAL #3   Title  Patient will increase six minute walk test distance to >2000 for progression to community ambulator and improve gait ability    Time  8    Period  Weeks    Status  New    Target Date  10/16/18            Plan - 08/28/18 1727    Clinical Impression Statement  Patient reports 4/10 left foot and responds to [place] and e-stim to left ankle for pain relief. Her HEP was reviewed . She is ambulating wiht cam boot on left foot and she will continue to benefit from skiilled PT to improve mobility and decrease pain.      Rehab Potential  Fair    PT Frequency  2x / week    PT Duration  8 weeks    PT Treatment/Interventions  Electrical Stimulation;Aquatic Therapy;Cryotherapy;Moist Heat;Ultrasound;Gait training;Therapeutic activities;Therapeutic exercise;Patient/family education;Neuromuscular re-education;Manual techniques;Passive range of motion;Dry needling;Splinting;Taping    PT Next Visit Plan  [place], e-stim, strengthening left ankle    Consulted and Agree with Plan of Care  Patient       Patient will benefit from skilled therapeutic intervention in order to improve the following deficits and impairments:  Abnormal gait, Decreased endurance, Decreased mobility, Difficulty walking, Obesity, Decreased activity tolerance, Decreased strength, Impaired flexibility  Visit Diagnosis: Muscle weakness (generalized)  Difficulty in walking, not elsewhere classified     Problem List Patient Active Problem List   Diagnosis Date Noted  . OSA on CPAP 05/15/2018  . Non-alcoholic fatty liver disease 03/06/2017  . Rosacea   . GERD (gastroesophageal reflux disease)   . Central serous retinopathy   . IBS (irritable bowel syndrome)   . Hyperlipidemia, mixed 01/24/2017  . Dyspnea on effort 12/22/2016  . Anxiety, generalized 04/29/2016  . Diabetes mellitus without complication ([place]) 91/91/6606  . Cancer of the skin, basal cell 03/12/2016  . Contact dermatitis 04/23/2013  . Nodule, subcutaneous 07/24/2012  . Localized swelling, mass and lump, unspecified 07/24/2012  . Hypertension 12/20/2011  . Personal history of breast cancer 12/20/2011  . Obesity 12/20/2011    Alanson Puls, PT DPT 08/28/2018, 5:31 PM  [place] MAIN [facility] SERVICES [address] [place], [place], [address] Phone: [phone]   Fax:  [phone]  Name: Jacqueline Deleon MRN: [id number] Date of Birth: 1961-04-25

## 2018-08-29 ENCOUNTER — Other Ambulatory Visit: Payer: Self-pay | Admitting: Certified Nurse Midwife

## 2018-08-29 DIAGNOSIS — R1032 Left lower quadrant pain: Secondary | ICD-10-CM

## 2018-08-29 DIAGNOSIS — R197 Diarrhea, unspecified: Secondary | ICD-10-CM

## 2018-08-29 DIAGNOSIS — Z800 Family history of malignant neoplasm of digestive organs: Secondary | ICD-10-CM | POA: Diagnosis not present

## 2018-08-29 DIAGNOSIS — K7460 Unspecified cirrhosis of liver: Secondary | ICD-10-CM | POA: Diagnosis not present

## 2018-08-29 DIAGNOSIS — R1031 Right lower quadrant pain: Secondary | ICD-10-CM

## 2018-08-29 NOTE — Progress Notes (Signed)
Orders written for a INR/PT. Dalia Heading, CNM

## 2018-08-30 ENCOUNTER — Other Ambulatory Visit: Payer: 59

## 2018-08-30 ENCOUNTER — Ambulatory Visit: Payer: 59 | Admitting: Physical Therapy

## 2018-08-30 ENCOUNTER — Encounter: Payer: Self-pay | Admitting: Physical Therapy

## 2018-08-30 DIAGNOSIS — R1031 Right lower quadrant pain: Secondary | ICD-10-CM

## 2018-08-30 DIAGNOSIS — M6281 Muscle weakness (generalized): Secondary | ICD-10-CM

## 2018-08-30 DIAGNOSIS — R197 Diarrhea, unspecified: Secondary | ICD-10-CM

## 2018-08-30 DIAGNOSIS — K7460 Unspecified cirrhosis of liver: Secondary | ICD-10-CM

## 2018-08-30 DIAGNOSIS — R1032 Left lower quadrant pain: Secondary | ICD-10-CM | POA: Diagnosis not present

## 2018-08-30 DIAGNOSIS — R262 Difficulty in walking, not elsewhere classified: Secondary | ICD-10-CM

## 2018-08-30 NOTE — Therapy (Signed)
[place] MAIN [facility] SERVICES [address] [place], [place], [address] Phone: [phone]   Fax:  [phone]  Physical Therapy Treatment  Patient Details  Name: LAKEISA HENINGER MRN: [id number] Date of Birth: 06-01-1961 Referring Provider (PT): Derinda Late   Encounter Date: 08/30/2018  PT End of Session - 08/30/18 1731    Visit Number  4    Number of Visits  17    Date for PT Re-Evaluation  10/16/18    PT Start Time  0445    PT Stop Time  0528    PT Time Calculation (min)  43 min    Activity Tolerance  Patient tolerated treatment well;Patient limited by pain    Behavior During Therapy  [facility] for tasks assessed/performed       Past Medical History:  Diagnosis Date  . Arthritis   . 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 Breast mass 08/2006, 03/2009   left breast biopsy fibroadenoma (2008) right breast biopsy benign (2010)  . 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 Central serous retinopathy   . Fatty liver   . GERD (gastroesophageal reflux disease)   . Gestational diabetes   . Headache    migraines  . Heart murmur   . History of abnormal mammogram 08/2006, 01/2008, 03/2009  . Hypertension   . IBS (irritable bowel syndrome)   . Joint disorder    hx of left ankle tendonitis, bursitis, heel spur  . Obesity 2018   BMI 45  . Pelvic pain    adhesions and adenomyosis  . Rosacea     Past Surgical History:  Procedure Laterality Date  . BREAST BIOPSY Left 08/2006   benign fibroadenoma  . BREAST EXCISIONAL BIOPSY Right 02/26/2008   right breast invasive mam ca with rad partial mastectomy   . BREAST SURGERY Right    x2/ Dr Tamala Julian  . CARDIAC CATHETERIZATION  2006  . CESAREAN SECTION  04/02/1998  . CHOLECYSTECTOMY  04/2010   Dr. Smith/ laprascopic surgery  . COLONOSCOPY  04/04/2000  . ESOPHAGOGASTRODUODENOSCOPY (EGD) WITH PROPOFOL N/A 05/01/2015   Procedure:  ESOPHAGOGASTRODUODENOSCOPY (EGD) WITH PROPOFOL;  Surgeon: Hulen Luster, MD;  Location: [facility] ENDOSCOPY;  Service: Gastroenterology;  Laterality: N/A;  . EYE SURGERY  08/2012   laser surgery on retina/ DR Appenzeller  . FINGER SURGERY     repair of tendon in right index, Dr. Francesco Sor in [place]  . FOOT SURGERY     Dr. Milinda Pointer  . IRRIGATION AND DEBRIDEMENT SEBACEOUS CYST     right upper back  . LAPAROSCOPIC SUPRACERVICAL HYSTERECTOMY  06/2007   Dr Kincius/ adhesions/CPP/adenomyosis  . MASTECTOMY PARTIAL / LUMPECTOMY Right 01/2008   with sentinal lymph node biopsy/ infiltrating ductal cancer  . SKIN CANCER EXCISION     back and calves  . WISDOM TOOTH EXTRACTION  1991    There were no vitals filed for this visit.  Subjective Assessment - 08/30/18 1730    Subjective  Patient is having 0/10 pain to left ankle and continues to wear her CAM boot.     Pertinent History   Patient reports 07/19/2018 after having stepped hard on her left foot  not using the cam walker using her regular tennis shoes she had a sharp pain around the cuboid and radiating up her leg.  Radiographs show a  tear in the peroneus longus tendon.  She has tenderness on palpation  of the cuboid    How long can you walk comfortably?  5-10 mins standing and waking    Patient Stated Goals  to walk and stand without pain.     Currently in Pain?  No/denies    Pain Score  0-No pain    Pain Onset  More than a month ago       Treatment:  [place] 1. 3 cm squaredx 10 mins,  to left ankle lateral 5th MT and plantar foot and lateral ankle   Manual therapy: STM to left ankle lateral 5th MT and plantar foot and lateral ankle   DF stretch with sheet x 30 sec x 3 reps  Baps board fwd/bwd x 20 , side to side x 20 , CCW, CW x 20 reps  Patient has no pain following treatment and no pain during treatment                        PT Education - 08/30/18 1731    Education Details  HEP    Person(s) Educated  Patient     Methods  Explanation    Comprehension  Verbalized understanding;Returned demonstration;Need further instruction       PT Short Term Goals - 08/22/18 0916      PT SHORT TERM GOAL #1   Title  Patient will report a worst pain of 3/10 on VAS in left ankle to improve tolerance with ADLs and reduced symptoms with activities.     Time  4    Period  Weeks    Status  New    Target Date  09/19/18      PT SHORT TERM GOAL #2   Title  Patient will increase BLE gross strength to 3+/5, no pain as to improve functional strength for independent gait, increased standing tolerance and increased ADL ability.    Time  4    Period  Weeks    Status  New    Target Date  09/19/18        PT Long Term Goals - 08/22/18 1610      PT LONG TERM GOAL #1   Title  Patient will increase lower extremity functional scale to >60/80 to demonstrate improved functional mobility and increased tolerance with ADLs.     Time  8    Period  Weeks    Status  New    Target Date  10/16/18      PT LONG TERM GOAL #2   Title  Patient will report a worst pain of 1/10 on VAS in  left ankle  to improve tolerance with ADLs and reduced symptoms with activities.     Time  8    Period  Weeks    Status  New    Target Date  10/16/18      PT LONG TERM GOAL #3   Title  Patient will increase six minute walk test distance to >2000 for progression to community ambulator and improve gait ability    Time  8    Period  Weeks    Status  New    Target Date  10/16/18            Plan - 08/30/18 1731    Clinical Impression Statement  Patient has no pain today in left ankle and arrives wearing CAM boot. She has [place] x 10 mins followed by STM to left ankle and therapeutic exercise to left ankle. Patient will continue to benefit from  skilled PT to improve mobility .    Rehab Potential  Fair    PT Frequency  2x / week    PT Duration  8 weeks    PT Treatment/Interventions  Electrical Stimulation;Aquatic Therapy;Cryotherapy;Moist  Heat;Ultrasound;Gait training;Therapeutic activities;Therapeutic exercise;Patient/family education;Neuromuscular re-education;Manual techniques;Passive range of motion;Dry needling;Splinting;Taping    PT Next Visit Plan  [place], e-stim, strengthening left ankle    Consulted and Agree with Plan of Care  Patient       Patient will benefit from skilled therapeutic intervention in order to improve the following deficits and impairments:  Abnormal gait, Decreased endurance, Decreased mobility, Difficulty walking, Obesity, Decreased activity tolerance, Decreased strength, Impaired flexibility  Visit Diagnosis: Muscle weakness (generalized)  Difficulty in walking, not elsewhere classified     Problem List Patient Active Problem List   Diagnosis Date Noted  . 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 OSA on CPAP 05/15/2018  . Non-alcoholic fatty liver disease 03/06/2017  . Rosacea   . GERD (gastroesophageal reflux disease)   . Central serous retinopathy   . IBS (irritable bowel syndrome)   . Hyperlipidemia, mixed 01/24/2017  . Dyspnea on effort 12/22/2016  . Anxiety, generalized 04/29/2016  . Diabetes mellitus without complication ([place]) 34/37/3578  . Cancer of the skin, basal cell 03/12/2016  . Contact dermatitis 04/23/2013  . Nodule, subcutaneous 07/24/2012  . Localized swelling, mass and lump, unspecified 07/24/2012  . Hypertension 12/20/2011  . Personal history of breast cancer 12/20/2011  . Obesity 12/20/2011    Alanson Puls, PT DPT 08/30/2018, 5:34 PM  [place] MAIN [facility] SERVICES [address] [place], [place], [address] Phone: [phone]   Fax:  [phone]  Name: ANNALEESE GUIER MRN: [id number] Date of Birth: Nov 29, 1960

## 2018-08-31 LAB — PROTIME-INR
INR: 1 (ref 0.8–1.2)
Prothrombin Time: 10.7 s (ref 9.1–12.0)

## 2018-09-04 ENCOUNTER — Ambulatory Visit: Payer: 59 | Admitting: Physical Therapy

## 2018-09-05 ENCOUNTER — Ambulatory Visit: Payer: 59 | Admitting: Physical Therapy

## 2018-09-05 DIAGNOSIS — M6080 Other myositis, unspecified site: Secondary | ICD-10-CM | POA: Diagnosis not present

## 2018-09-05 DIAGNOSIS — M5412 Radiculopathy, cervical region: Secondary | ICD-10-CM | POA: Diagnosis not present

## 2018-09-05 DIAGNOSIS — M9903 Segmental and somatic dysfunction of lumbar region: Secondary | ICD-10-CM | POA: Diagnosis not present

## 2018-09-05 DIAGNOSIS — M9901 Segmental and somatic dysfunction of cervical region: Secondary | ICD-10-CM | POA: Diagnosis not present

## 2018-09-05 DIAGNOSIS — M9902 Segmental and somatic dysfunction of thoracic region: Secondary | ICD-10-CM | POA: Diagnosis not present

## 2018-09-05 DIAGNOSIS — M542 Cervicalgia: Secondary | ICD-10-CM | POA: Diagnosis not present

## 2018-09-05 DIAGNOSIS — R51 Headache: Secondary | ICD-10-CM | POA: Diagnosis not present

## 2018-09-05 DIAGNOSIS — M9904 Segmental and somatic dysfunction of sacral region: Secondary | ICD-10-CM | POA: Diagnosis not present

## 2018-09-05 DIAGNOSIS — M545 Low back pain: Secondary | ICD-10-CM | POA: Diagnosis not present

## 2018-09-06 ENCOUNTER — Ambulatory Visit: Payer: 59 | Admitting: Physical Therapy

## 2018-09-06 ENCOUNTER — Encounter: Payer: Self-pay | Admitting: Physical Therapy

## 2018-09-06 DIAGNOSIS — M6281 Muscle weakness (generalized): Secondary | ICD-10-CM

## 2018-09-06 DIAGNOSIS — R262 Difficulty in walking, not elsewhere classified: Secondary | ICD-10-CM | POA: Diagnosis not present

## 2018-09-06 NOTE — Therapy (Signed)
[place] MAIN [facility] SERVICES [address] [place], [place], [address] Phone: [phone]   Fax:  [phone]  Physical Therapy Treatment  Patient Details  Name: Jacqueline Deleon MRN: [id number] Date of Birth: 1961-06-24 Referring Provider (PT): Derinda Late   Encounter Date: 09/06/2018  PT End of Session - 09/06/18 1704    Visit Number  5    Number of Visits  17    Date for PT Re-Evaluation  10/16/18    PT Start Time  0445    PT Stop Time  0530    PT Time Calculation (min)  45 min    Activity Tolerance  Patient tolerated treatment well;Patient limited by pain    Behavior During Therapy  [facility] for tasks assessed/performed       Past Medical History:  Diagnosis Date  . Arthritis   . 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 Breast mass 08/2006, 03/2009   left breast biopsy fibroadenoma (2008) right breast biopsy benign (2010)  . 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 Central serous retinopathy   . Fatty liver   . GERD (gastroesophageal reflux disease)   . Gestational diabetes   . Headache    migraines  . Heart murmur   . History of abnormal mammogram 08/2006, 01/2008, 03/2009  . Hypertension   . IBS (irritable bowel syndrome)   . Joint disorder    hx of left ankle tendonitis, bursitis, heel spur  . Obesity 2018   BMI 45  . Pelvic pain    adhesions and adenomyosis  . Rosacea     Past Surgical History:  Procedure Laterality Date  . BREAST BIOPSY Left 08/2006   benign fibroadenoma  . BREAST EXCISIONAL BIOPSY Right 02/26/2008   right breast invasive mam ca with rad partial mastectomy   . BREAST SURGERY Right    x2/ Dr Tamala Julian  . CARDIAC CATHETERIZATION  2006  . CESAREAN SECTION  04/02/1998  . CHOLECYSTECTOMY  04/2010   Dr. Smith/ laprascopic surgery  . COLONOSCOPY  04/04/2000  . ESOPHAGOGASTRODUODENOSCOPY (EGD) WITH PROPOFOL N/A 05/01/2015   Procedure:  ESOPHAGOGASTRODUODENOSCOPY (EGD) WITH PROPOFOL;  Surgeon: Hulen Luster, MD;  Location: [facility] ENDOSCOPY;  Service: Gastroenterology;  Laterality: N/A;  . EYE SURGERY  08/2012   laser surgery on retina/ DR Appenzeller  . FINGER SURGERY     repair of tendon in right index, Dr. Francesco Sor in [place]  . FOOT SURGERY     Dr. Milinda Pointer  . IRRIGATION AND DEBRIDEMENT SEBACEOUS CYST     right upper back  . LAPAROSCOPIC SUPRACERVICAL HYSTERECTOMY  06/2007   Dr Kincius/ adhesions/CPP/adenomyosis  . MASTECTOMY PARTIAL / LUMPECTOMY Right 01/2008   with sentinal lymph node biopsy/ infiltrating ductal cancer  . SKIN CANCER EXCISION     back and calves  . WISDOM TOOTH EXTRACTION  1991    There were no vitals filed for this visit.  Subjective Assessment - 09/06/18 1702    Subjective  Patient is having 0/10 pain to left ankle and continues to wear her CAM boot. When she walks intermediate distances the pain begins to go up but not as high as before.    Pertinent History   Patient reports 07/19/2018 after having stepped hard on her left foot  not using the cam walker using her regular tennis shoes she had a sharp pain around the cuboid and radiating up her leg.  Radiographs show a  tear in the peroneus longus tendon.  She has tenderness on palpation of the cuboid    How long can you walk comfortably?  5-10 mins standing and waking    Patient Stated Goals  to walk and stand without pain.     Currently in Pain?  No/denies    Pain Score  0-No pain    Pain Onset  More than a month ago       Treatment: PROM to left ankle with no reports of pain Patient has tenderness to palpation lateral ankle  Resisted left ankle eversion without pain to left ankle E-stim to left ankle crossed pattern IFC 13 volts STM to left ankle and left lateral foot for pain relief [place] to left ankle 1.3 CM squared x 15 mins  Patient has no reports of pain prior to treatment today or following theratment                         PT Education - 09/06/18 1704    Education Details  HEP    Person(s) Educated  Patient    Methods  Explanation    Comprehension  Verbalized understanding;Returned demonstration       PT Short Term Goals - 08/22/18 0916      PT SHORT TERM GOAL #1   Title  Patient will report a worst pain of 3/10 on VAS in left ankle to improve tolerance with ADLs and reduced symptoms with activities.     Time  4    Period  Weeks    Status  New    Target Date  09/19/18      PT SHORT TERM GOAL #2   Title  Patient will increase BLE gross strength to 3+/5, no pain as to improve functional strength for independent gait, increased standing tolerance and increased ADL ability.    Time  4    Period  Weeks    Status  New    Target Date  09/19/18        PT Long Term Goals - 08/22/18 6269      PT LONG TERM GOAL #1   Title  Patient will increase lower extremity functional scale to >60/80 to demonstrate improved functional mobility and increased tolerance with ADLs.     Time  8    Period  Weeks    Status  New    Target Date  10/16/18      PT LONG TERM GOAL #2   Title  Patient will report a worst pain of 1/10 on VAS in  left ankle  to improve tolerance with ADLs and reduced symptoms with activities.     Time  8    Period  Weeks    Status  New    Target Date  10/16/18      PT LONG TERM GOAL #3   Title  Patient will increase six minute walk test distance to >2000 for progression to community ambulator and improve gait ability    Time  8    Period  Weeks    Status  New    Target Date  10/16/18            Plan - 09/06/18 1705    Clinical Impression Statement  Patient tolerates e stim to left ankle and [place] to left ankle without increased pain following treatment. She is doing her HEP and continues to wear the CAM boot. She wil conintue to  benefit from skilled PT to improve strenght and mobility.     Rehab Potential  Fair    PT Frequency  2x / week     PT Duration  8 weeks    PT Treatment/Interventions  Electrical Stimulation;Aquatic Therapy;Cryotherapy;Moist Heat;Ultrasound;Gait training;Therapeutic activities;Therapeutic exercise;Patient/family education;Neuromuscular re-education;Manual techniques;Passive range of motion;Dry needling;Splinting;Taping    PT Next Visit Plan  [place], e-stim, strengthening left ankle    Consulted and Agree with Plan of Care  Patient       Patient will benefit from skilled therapeutic intervention in order to improve the following deficits and impairments:  Abnormal gait, Decreased endurance, Decreased mobility, Difficulty walking, Obesity, Decreased activity tolerance, Decreased strength, Impaired flexibility  Visit Diagnosis: Muscle weakness (generalized)  Difficulty in walking, not elsewhere classified     Problem List Patient Active Problem List   Diagnosis Date Noted  . 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 OSA on CPAP 05/15/2018  . Non-alcoholic fatty liver disease 03/06/2017  . Rosacea   . GERD (gastroesophageal reflux disease)   . Central serous retinopathy   . IBS (irritable bowel syndrome)   . Hyperlipidemia, mixed 01/24/2017  . Dyspnea on effort 12/22/2016  . Anxiety, generalized 04/29/2016  . Diabetes mellitus without complication ([place]) 90/24/0973  . Cancer of the skin, basal cell 03/12/2016  . Contact dermatitis 04/23/2013  . Nodule, subcutaneous 07/24/2012  . Localized swelling, mass and lump, unspecified 07/24/2012  . Hypertension 12/20/2011  . Personal history of breast cancer 12/20/2011  . Obesity 12/20/2011    Alanson Puls, PT DPT 09/07/2018, 5:31 PM  [place] MAIN [facility] SERVICES [address] [place], [place], [address] Phone: [phone]   Fax:  [phone]  Name: Jacqueline Deleon MRN: [id number] Date of Birth: 04-26-1961

## 2018-09-11 ENCOUNTER — Encounter: Payer: 59 | Admitting: Physical Therapy

## 2018-09-11 ENCOUNTER — Ambulatory Visit: Payer: 59 | Admitting: Physical Therapy

## 2018-09-13 ENCOUNTER — Ambulatory Visit: Payer: 59 | Attending: Podiatry | Admitting: Physical Therapy

## 2018-09-13 ENCOUNTER — Encounter: Payer: 59 | Admitting: Physical Therapy

## 2018-09-13 DIAGNOSIS — M6281 Muscle weakness (generalized): Secondary | ICD-10-CM | POA: Diagnosis not present

## 2018-09-13 DIAGNOSIS — R262 Difficulty in walking, not elsewhere classified: Secondary | ICD-10-CM | POA: Diagnosis not present

## 2018-09-13 DIAGNOSIS — G4733 Obstructive sleep apnea (adult) (pediatric): Secondary | ICD-10-CM | POA: Diagnosis not present

## 2018-09-13 NOTE — Therapy (Signed)
[place] MAIN [facility] SERVICES [address] [place], [place], [address] Phone: [phone]   Fax:  [phone]  Physical Therapy Treatment  Patient Details  Name: Jacqueline Deleon MRN: [id number] Date of Birth: 05/31/1961 Referring Provider (PT): Derinda Late   Encounter Date: 09/13/2018  PT End of Session - 09/13/18 1736    Visit Number  6    Number of Visits  17    Date for PT Re-Evaluation  10/16/18    PT Start Time  0445    PT Stop Time  0530    PT Time Calculation (min)  45 min    Activity Tolerance  Patient tolerated treatment well;Patient limited by pain    Behavior During Therapy  [facility] for tasks assessed/performed       Past Medical History:  Diagnosis Date  . Arthritis   . 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 Breast mass 08/2006, 03/2009   left breast biopsy fibroadenoma (2008) right breast biopsy benign (2010)  . 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 Central serous retinopathy   . Fatty liver   . GERD (gastroesophageal reflux disease)   . Gestational diabetes   . Headache    migraines  . Heart murmur   . History of abnormal mammogram 08/2006, 01/2008, 03/2009  . Hypertension   . IBS (irritable bowel syndrome)   . Joint disorder    hx of left ankle tendonitis, bursitis, heel spur  . Obesity 2018   BMI 45  . Pelvic pain    adhesions and adenomyosis  . Rosacea     Past Surgical History:  Procedure Laterality Date  . BREAST BIOPSY Left 08/2006   benign fibroadenoma  . BREAST EXCISIONAL BIOPSY Right 02/26/2008   right breast invasive mam ca with rad partial mastectomy   . BREAST SURGERY Right    x2/ Dr Tamala Julian  . CARDIAC CATHETERIZATION  2006  . CESAREAN SECTION  04/02/1998  . CHOLECYSTECTOMY  04/2010   Dr. Smith/ laprascopic surgery  . COLONOSCOPY  04/04/2000  . ESOPHAGOGASTRODUODENOSCOPY (EGD) WITH PROPOFOL N/A 05/01/2015   Procedure:  ESOPHAGOGASTRODUODENOSCOPY (EGD) WITH PROPOFOL;  Surgeon: Hulen Luster, MD;  Location: [facility] ENDOSCOPY;  Service: Gastroenterology;  Laterality: N/A;  . EYE SURGERY  08/2012   laser surgery on retina/ DR Appenzeller  . FINGER SURGERY     repair of tendon in right index, Dr. Francesco Sor in [place]  . FOOT SURGERY     Dr. Milinda Pointer  . IRRIGATION AND DEBRIDEMENT SEBACEOUS CYST     right upper back  . LAPAROSCOPIC SUPRACERVICAL HYSTERECTOMY  06/2007   Dr Kincius/ adhesions/CPP/adenomyosis  . MASTECTOMY PARTIAL / LUMPECTOMY Right 01/2008   with sentinal lymph node biopsy/ infiltrating ductal cancer  . SKIN CANCER EXCISION     back and calves  . WISDOM TOOTH EXTRACTION  1991    There were no vitals filed for this visit.  Subjective Assessment - 09/13/18 1652    Subjective  Patient is having 0/10 pain to left ankle and continues to wear her CAM boot. When she walks intermediate distances the pain begins to go up but not as high as before.  (Pended)     Pertinent History   Patient reports 07/19/2018 after having stepped hard on her left foot  not using the cam walker using her regular tennis shoes she had a sharp pain around the cuboid and radiating up  her leg.  Radiographs show a  tear in the peroneus longus tendon.  She has tenderness on palpation of the cuboid  (Pended)     How long can you walk comfortably?  5-10 mins standing and waking  (Pended)     Patient Stated Goals  to walk and stand without pain.   (Pended)     Pain Onset  More than a month ago  (Pended)        Treatment:  [place] 1. 3 cm squaredx 15 mins, to left ankle lateral 5th MT and plantar foot and lateral ankle    E-stim IFC to left lateral ankle freq 80/150 HZ, 8.2 volts, crossed pattern  Manual therapy: STM to left ankle lateral 5th MT and plantar foot and lateral ankle  DF stretch with sheet x 30 sec x 3 reps  Baps board fwd/bwd x 20 , side to side x 20 , CCW, CW x 20 reps  Patient has no pain following treatment and  no pain during treatment                        PT Education - 09/13/18 1735    Education Details  HEP    Person(s) Educated  Patient    Methods  Explanation    Comprehension  Need further instruction       PT Short Term Goals - 08/22/18 0916      PT SHORT TERM GOAL #1   Title  Patient will report a worst pain of 3/10 on VAS in left ankle to improve tolerance with ADLs and reduced symptoms with activities.     Time  4    Period  Weeks    Status  New    Target Date  09/19/18      PT SHORT TERM GOAL #2   Title  Patient will increase BLE gross strength to 3+/5, no pain as to improve functional strength for independent gait, increased standing tolerance and increased ADL ability.    Time  4    Period  Weeks    Status  New    Target Date  09/19/18        PT Long Term Goals - 08/22/18 1610      PT LONG TERM GOAL #1   Title  Patient will increase lower extremity functional scale to >60/80 to demonstrate improved functional mobility and increased tolerance with ADLs.     Time  8    Period  Weeks    Status  New    Target Date  10/16/18      PT LONG TERM GOAL #2   Title  Patient will report a worst pain of 1/10 on VAS in  left ankle  to improve tolerance with ADLs and reduced symptoms with activities.     Time  8    Period  Weeks    Status  New    Target Date  10/16/18      PT LONG TERM GOAL #3   Title  Patient will increase six minute walk test distance to >2000 for progression to community ambulator and improve gait ability    Time  8    Period  Weeks    Status  New    Target Date  10/16/18            Plan - 09/13/18 1736    Clinical Impression Statement  Patient tolerates e stim to left ankle and [place] to left  ankle without increased pain following treatment. She is doing her HEP and continues to wear the CAM boot. She wil conintue to benefit from skilled PT to improve strenght and mobility    Rehab Potential  Fair    PT Frequency  2x / week     PT Duration  8 weeks    PT Treatment/Interventions  Electrical Stimulation;Aquatic Therapy;Cryotherapy;Moist Heat;Ultrasound;Gait training;Therapeutic activities;Therapeutic exercise;Patient/family education;Neuromuscular re-education;Manual techniques;Passive range of motion;Dry needling;Splinting;Taping    PT Next Visit Plan  [place], e-stim, strengthening left ankle    Consulted and Agree with Plan of Care  Patient       Patient will benefit from skilled therapeutic intervention in order to improve the following deficits and impairments:  Abnormal gait, Decreased endurance, Decreased mobility, Difficulty walking, Obesity, Decreased activity tolerance, Decreased strength, Impaired flexibility  Visit Diagnosis: Muscle weakness (generalized)  Difficulty in walking, not elsewhere classified     Problem List Patient Active Problem List   Diagnosis Date Noted  . 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 OSA on CPAP 05/15/2018  . Non-alcoholic fatty liver disease 03/06/2017  . Rosacea   . GERD (gastroesophageal reflux disease)   . Central serous retinopathy   . IBS (irritable bowel syndrome)   . Hyperlipidemia, mixed 01/24/2017  . Dyspnea on effort 12/22/2016  . Anxiety, generalized 04/29/2016  . Diabetes mellitus without complication ([place]) 01/41/0301  . Cancer of the skin, basal cell 03/12/2016  . Contact dermatitis 04/23/2013  . Nodule, subcutaneous 07/24/2012  . Localized swelling, mass and lump, unspecified 07/24/2012  . Hypertension 12/20/2011  . Personal history of breast cancer 12/20/2011  . Obesity 12/20/2011    Alanson Puls, PT DPT 09/13/2018, 5:39 PM  [place] MAIN [facility] SERVICES [address] [place], [place], [address] Phone: [phone]   Fax:  [phone]  Name: ABIGAILE ROSSIE MRN: [id number] Date of Birth: [birth year]

## 2018-09-18 ENCOUNTER — Encounter: Payer: Self-pay | Admitting: Physical Therapy

## 2018-09-18 ENCOUNTER — Ambulatory Visit: Payer: 59 | Admitting: Physical Therapy

## 2018-09-18 DIAGNOSIS — M6281 Muscle weakness (generalized): Secondary | ICD-10-CM | POA: Diagnosis not present

## 2018-09-18 DIAGNOSIS — R262 Difficulty in walking, not elsewhere classified: Secondary | ICD-10-CM

## 2018-09-18 NOTE — Therapy (Signed)
[place] MAIN [facility] SERVICES [address] [place], [place], [address] Phone: [phone]   Fax:  [phone]  Physical Therapy Treatment  Patient Details  Name: Jacqueline Deleon MRN: [id number] Date of Birth: 06/24/1961 Referring Provider (PT): Derinda Late   Encounter Date: 09/18/2018  PT End of Session - 09/18/18 1704    Visit Number  7    Number of Visits  17    Date for PT Re-Evaluation  10/16/18    PT Start Time  0445    PT Stop Time  0530    PT Time Calculation (min)  45 min    Activity Tolerance  Patient tolerated treatment well;Patient limited by pain    Behavior During Therapy  [facility] for tasks assessed/performed       Past Medical History:  Diagnosis Date  . Arthritis   . 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 Breast mass 08/2006, 03/2009   left breast biopsy fibroadenoma (2008) right breast biopsy benign (2010)  . 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 Central serous retinopathy   . Fatty liver   . GERD (gastroesophageal reflux disease)   . Gestational diabetes   . Headache    migraines  . Heart murmur   . History of abnormal mammogram 08/2006, 01/2008, 03/2009  . Hypertension   . IBS (irritable bowel syndrome)   . Joint disorder    hx of left ankle tendonitis, bursitis, heel spur  . Obesity 2018   BMI 45  . Pelvic pain    adhesions and adenomyosis  . Rosacea     Past Surgical History:  Procedure Laterality Date  . BREAST BIOPSY Left 08/2006   benign fibroadenoma  . BREAST EXCISIONAL BIOPSY Right 02/26/2008   right breast invasive mam ca with rad partial mastectomy   . BREAST SURGERY Right    x2/ Dr Tamala Julian  . CARDIAC CATHETERIZATION  2006  . CESAREAN SECTION  04/02/1998  . CHOLECYSTECTOMY  04/2010   Dr. Smith/ laprascopic surgery  . COLONOSCOPY  04/04/2000  . ESOPHAGOGASTRODUODENOSCOPY (EGD) WITH PROPOFOL N/A 05/01/2015   Procedure:  ESOPHAGOGASTRODUODENOSCOPY (EGD) WITH PROPOFOL;  Surgeon: Hulen Luster, MD;  Location: [facility] ENDOSCOPY;  Service: Gastroenterology;  Laterality: N/A;  . EYE SURGERY  08/2012   laser surgery on retina/ DR Appenzeller  . FINGER SURGERY     repair of tendon in right index, Dr. Francesco Sor in [place]  . FOOT SURGERY     Dr. Milinda Pointer  . IRRIGATION AND DEBRIDEMENT SEBACEOUS CYST     right upper back  . LAPAROSCOPIC SUPRACERVICAL HYSTERECTOMY  06/2007   Dr Kincius/ adhesions/CPP/adenomyosis  . MASTECTOMY PARTIAL / LUMPECTOMY Right 01/2008   with sentinal lymph node biopsy/ infiltrating ductal cancer  . SKIN CANCER EXCISION     back and calves  . WISDOM TOOTH EXTRACTION  1991    There were no vitals filed for this visit.  Subjective Assessment - 09/18/18 1703    Subjective  Patient is having 0/10 pain to left ankle and continues to wear her CAM boot. When she walks intermediate distances the pain begins to go up but not as high as before.    Pertinent History   Patient reports 07/19/2018 after having stepped hard on her left foot  not using the cam walker using her regular tennis shoes she had a sharp pain around the cuboid and radiating up her leg.  Radiographs show a  tear in the peroneus longus tendon.  She has tenderness on palpation of the cuboid    How long can you walk comfortably?  5-10 mins standing and waking    Patient Stated Goals  to walk and stand without pain.     Currently in Pain?  No/denies    Pain Score  0-No pain    Pain Onset  More than a month ago       Treatment:  [place] 1. 3 cm squaredx 15 mins, to left ankle lateral 5th MT and plantar foot and lateral ankle    E-stim IFC to left lateral ankle freq 80/150 HZ, 8.2 volts, crossed pattern  Patient reports that the e-stim makes it ache during the treatment and she has no pain following treatment.                         PT Education - 09/18/18 1704    Education Details  HEP    Person(s) Educated   Patient    Methods  Demonstration    Comprehension  Returned demonstration;Need further instruction       PT Short Term Goals - 08/22/18 0916      PT SHORT TERM GOAL #1   Title  Patient will report a worst pain of 3/10 on VAS in left ankle to improve tolerance with ADLs and reduced symptoms with activities.     Time  4    Period  Weeks    Status  New    Target Date  09/19/18      PT SHORT TERM GOAL #2   Title  Patient will increase BLE gross strength to 3+/5, no pain as to improve functional strength for independent gait, increased standing tolerance and increased ADL ability.    Time  4    Period  Weeks    Status  New    Target Date  09/19/18        PT Long Term Goals - 08/22/18 3662      PT LONG TERM GOAL #1   Title  Patient will increase lower extremity functional scale to >60/80 to demonstrate improved functional mobility and increased tolerance with ADLs.     Time  8    Period  Weeks    Status  New    Target Date  10/16/18      PT LONG TERM GOAL #2   Title  Patient will report a worst pain of 1/10 on VAS in  left ankle  to improve tolerance with ADLs and reduced symptoms with activities.     Time  8    Period  Weeks    Status  New    Target Date  10/16/18      PT LONG TERM GOAL #3   Title  Patient will increase six minute walk test distance to >2000 for progression to community ambulator and improve gait ability    Time  8    Period  Weeks    Status  New    Target Date  10/16/18            Plan - 09/18/18 1705    Clinical Impression Statement  Patient tolerates e stim to left ankle and [place] to left ankle without increased pain following treatment. She is doing her HEP and continues to wear the CAM boot. She wil conintue to benefit from skilled PT to improve strenght and mobility    Rehab  Potential  Fair    PT Frequency  2x / week    PT Duration  8 weeks    PT Treatment/Interventions  Electrical Stimulation;Aquatic Therapy;Cryotherapy;Moist  Heat;Ultrasound;Gait training;Therapeutic activities;Therapeutic exercise;Patient/family education;Neuromuscular re-education;Manual techniques;Passive range of motion;Dry needling;Splinting;Taping    PT Next Visit Plan  [place], e-stim, strengthening left ankle    Consulted and Agree with Plan of Care  Patient       Patient will benefit from skilled therapeutic intervention in order to improve the following deficits and impairments:  Abnormal gait, Decreased endurance, Decreased mobility, Difficulty walking, Obesity, Decreased activity tolerance, Decreased strength, Impaired flexibility  Visit Diagnosis: Muscle weakness (generalized)  Difficulty in walking, not elsewhere classified     Problem List Patient Active Problem List   Diagnosis Date Noted  . 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 OSA on CPAP 05/15/2018  . Non-alcoholic fatty liver disease 03/06/2017  . Rosacea   . GERD (gastroesophageal reflux disease)   . Central serous retinopathy   . IBS (irritable bowel syndrome)   . Hyperlipidemia, mixed 01/24/2017  . Dyspnea on effort 12/22/2016  . Anxiety, generalized 04/29/2016  . Diabetes mellitus without complication ([place]) 48/25/0037  . Cancer of the skin, basal cell 03/12/2016  . Contact dermatitis 04/23/2013  . Nodule, subcutaneous 07/24/2012  . Localized swelling, mass and lump, unspecified 07/24/2012  . Hypertension 12/20/2011  . Personal history of breast cancer 12/20/2011  . Obesity 12/20/2011    Alanson Puls, PT DPT 09/18/2018, 5:08 PM  [place] MAIN [facility] SERVICES [address] [place], [place], [address] Phone: [phone]   Fax:  [phone]  Name: Jacqueline Deleon MRN: [id number] Date of Birth: 12-02-1960

## 2018-09-19 ENCOUNTER — Encounter: Payer: 59 | Admitting: Physical Therapy

## 2018-09-20 ENCOUNTER — Ambulatory Visit: Payer: 59 | Admitting: Physical Therapy

## 2018-09-20 ENCOUNTER — Encounter: Payer: Self-pay | Admitting: Physical Therapy

## 2018-09-20 ENCOUNTER — Other Ambulatory Visit: Payer: Self-pay

## 2018-09-20 DIAGNOSIS — R262 Difficulty in walking, not elsewhere classified: Secondary | ICD-10-CM | POA: Diagnosis not present

## 2018-09-20 DIAGNOSIS — M6281 Muscle weakness (generalized): Secondary | ICD-10-CM | POA: Diagnosis not present

## 2018-09-20 NOTE — Therapy (Signed)
[place] MAIN [facility] SERVICES [address] [place], [place], [address] Phone: [phone]   Fax:  [phone]  Physical Therapy Treatment  Patient Details  Name: Jacqueline Deleon MRN: [id number] Date of Birth: 1960-12-07 Referring Provider (PT): Derinda Late   Encounter Date: 09/20/2018  PT End of Session - 09/20/18 1700    Visit Number  8    Number of Visits  17    Date for PT Re-Evaluation  10/16/18    PT Start Time  0445    PT Stop Time  0530    PT Time Calculation (min)  45 min    Activity Tolerance  Patient tolerated treatment well;Patient limited by pain    Behavior During Therapy  [facility] for tasks assessed/performed       Past Medical History:  Diagnosis Date  . Arthritis   . 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 Breast mass 08/2006, 03/2009   left breast biopsy fibroadenoma (2008) right breast biopsy benign (2010)  . 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 Central serous retinopathy   . Fatty liver   . GERD (gastroesophageal reflux disease)   . Gestational diabetes   . Headache    migraines  . Heart murmur   . History of abnormal mammogram 08/2006, 01/2008, 03/2009  . Hypertension   . IBS (irritable bowel syndrome)   . Joint disorder    hx of left ankle tendonitis, bursitis, heel spur  . Obesity 2018   BMI 45  . Pelvic pain    adhesions and adenomyosis  . Rosacea     Past Surgical History:  Procedure Laterality Date  . BREAST BIOPSY Left 08/2006   benign fibroadenoma  . BREAST EXCISIONAL BIOPSY Right 02/26/2008   right breast invasive mam ca with rad partial mastectomy   . BREAST SURGERY Right    x2/ Dr Tamala Julian  . CARDIAC CATHETERIZATION  2006  . CESAREAN SECTION  04/02/1998  . CHOLECYSTECTOMY  04/2010   Dr. Smith/ laprascopic surgery  . COLONOSCOPY  04/04/2000  . ESOPHAGOGASTRODUODENOSCOPY (EGD) WITH PROPOFOL N/A 05/01/2015   Procedure:  ESOPHAGOGASTRODUODENOSCOPY (EGD) WITH PROPOFOL;  Surgeon: Hulen Luster, MD;  Location: [facility] ENDOSCOPY;  Service: Gastroenterology;  Laterality: N/A;  . EYE SURGERY  08/2012   laser surgery on retina/ DR Appenzeller  . FINGER SURGERY     repair of tendon in right index, Dr. Francesco Sor in [place]  . FOOT SURGERY     Dr. Milinda Pointer  . IRRIGATION AND DEBRIDEMENT SEBACEOUS CYST     right upper back  . LAPAROSCOPIC SUPRACERVICAL HYSTERECTOMY  06/2007   Dr Kincius/ adhesions/CPP/adenomyosis  . MASTECTOMY PARTIAL / LUMPECTOMY Right 01/2008   with sentinal lymph node biopsy/ infiltrating ductal cancer  . SKIN CANCER EXCISION     back and calves  . WISDOM TOOTH EXTRACTION  1991    There were no vitals filed for this visit.  Subjective Assessment - 09/20/18 1700    Subjective  Patient is having 0/10 pain to left ankle and continues to wear her CAM boot. When she walks intermediate distances the pain begins to go up but not as high as before.    Pertinent History   Patient reports 07/19/2018 after having stepped hard on her left foot  not using the cam walker using her regular tennis shoes she had a sharp pain around the cuboid and radiating up her leg.  Radiographs show a  tear in the peroneus longus tendon.  She has tenderness on palpation of the cuboid    How long can you walk comfortably?  5-10 mins standing and waking    Patient Stated Goals  to walk and stand without pain.     Currently in Pain?  No/denies    Pain Score  0-No pain    Pain Onset  More than a month ago       Treatment:  [place] 1. 3 cm squaredx 30mins, to left ankle lateral 5th MT and plantar foot and lateral ankle   E-stim IFC toleft lateral anklefreq 80/150 HZ, 12. 2 volts, crossed pattern  Patient reports that the e-stim makes it ache during the treatment and she has no pain following treatment.                          PT Education - 09/20/18 1700    Education Details  HEP    Person(s) Educated   Patient    Methods  Explanation    Comprehension  Returned demonstration;Need further instruction       PT Short Term Goals - 08/22/18 0916      PT SHORT TERM GOAL #1   Title  Patient will report a worst pain of 3/10 on VAS in left ankle to improve tolerance with ADLs and reduced symptoms with activities.     Time  4    Period  Weeks    Status  New    Target Date  09/19/18      PT SHORT TERM GOAL #2   Title  Patient will increase BLE gross strength to 3+/5, no pain as to improve functional strength for independent gait, increased standing tolerance and increased ADL ability.    Time  4    Period  Weeks    Status  New    Target Date  09/19/18        PT Long Term Goals - 08/22/18 7371      PT LONG TERM GOAL #1   Title  Patient will increase lower extremity functional scale to >60/80 to demonstrate improved functional mobility and increased tolerance with ADLs.     Time  8    Period  Weeks    Status  New    Target Date  10/16/18      PT LONG TERM GOAL #2   Title  Patient will report a worst pain of 1/10 on VAS in  left ankle  to improve tolerance with ADLs and reduced symptoms with activities.     Time  8    Period  Weeks    Status  New    Target Date  10/16/18      PT LONG TERM GOAL #3   Title  Patient will increase six minute walk test distance to >2000 for progression to community ambulator and improve gait ability    Time  8    Period  Weeks    Status  New    Target Date  10/16/18            Plan - 09/20/18 1701    Clinical Impression Statement  Patient tolerates e stim to left ankle and [place] to left ankle without increased pain following treatment. She is doing her HEP and continues to wear the CAM boot. She wil conintue to benefit from skilled PT to improve strenght and mobility    Rehab Potential  Fair    PT Frequency  2x / week    PT Duration  8 weeks    PT Treatment/Interventions  Electrical Stimulation;Aquatic Therapy;Cryotherapy;Moist  Heat;Ultrasound;Gait training;Therapeutic activities;Therapeutic exercise;Patient/family education;Neuromuscular re-education;Manual techniques;Passive range of motion;Dry needling;Splinting;Taping    PT Next Visit Plan  [place], e-stim, strengthening left ankle    Consulted and Agree with Plan of Care  Patient       Patient will benefit from skilled therapeutic intervention in order to improve the following deficits and impairments:  Abnormal gait, Decreased endurance, Decreased mobility, Difficulty walking, Obesity, Decreased activity tolerance, Decreased strength, Impaired flexibility  Visit Diagnosis: Muscle weakness (generalized)  Difficulty in walking, not elsewhere classified     Problem List Patient Active Problem List   Diagnosis Date Noted  . 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 OSA on CPAP 05/15/2018  . Non-alcoholic fatty liver disease 03/06/2017  . Rosacea   . GERD (gastroesophageal reflux disease)   . Central serous retinopathy   . IBS (irritable bowel syndrome)   . Hyperlipidemia, mixed 01/24/2017  . Dyspnea on effort 12/22/2016  . Anxiety, generalized 04/29/2016  . Diabetes mellitus without complication ([place]) 93/79/0240  . Cancer of the skin, basal cell 03/12/2016  . Contact dermatitis 04/23/2013  . Nodule, subcutaneous 07/24/2012  . Localized swelling, mass and lump, unspecified 07/24/2012  . Hypertension 12/20/2011  . Personal history of breast cancer 12/20/2011  . Obesity 12/20/2011    Alanson Puls, PT DPT 09/20/2018, 5:04 PM  [place] MAIN [facility] SERVICES [address] [place], [place], [address] Phone: [phone]   Fax:  [phone]  Name: Jacqueline Deleon MRN: [id number] Date of Birth: 04-14-1961

## 2018-09-21 ENCOUNTER — Encounter: Payer: 59 | Admitting: Physical Therapy

## 2018-09-26 ENCOUNTER — Encounter: Payer: 59 | Admitting: Physical Therapy

## 2018-09-27 ENCOUNTER — Ambulatory Visit: Payer: 59 | Admitting: Physical Therapy

## 2018-09-27 ENCOUNTER — Ambulatory Visit: Payer: 59

## 2018-09-27 ENCOUNTER — Other Ambulatory Visit: Payer: Self-pay

## 2018-09-27 DIAGNOSIS — R262 Difficulty in walking, not elsewhere classified: Secondary | ICD-10-CM | POA: Diagnosis not present

## 2018-09-27 DIAGNOSIS — M6281 Muscle weakness (generalized): Secondary | ICD-10-CM

## 2018-09-27 NOTE — Therapy (Signed)
[place] MAIN [facility] SERVICES [address] [place], [place], [address] Phone: [phone]   Fax:  [phone]  Physical Therapy Treatment  Patient Details  Name: Jacqueline Deleon MRN: [id number] Date of Birth: Sep 04, 1960 Referring Provider (PT): Derinda Late   Encounter Date: 09/27/2018  PT End of Session - 09/27/18 1510    Visit Number  9    Number of Visits  17    Date for PT Re-Evaluation  10/16/18    PT Start Time  1501    PT Stop Time  1696    PT Time Calculation (min)  40 min    Activity Tolerance  Patient tolerated treatment well;Patient limited by pain    Behavior During Therapy  [facility] for tasks assessed/performed       Past Medical History:  Diagnosis Date  . Arthritis   . 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 Breast mass 08/2006, 03/2009   left breast biopsy fibroadenoma (2008) right breast biopsy benign (2010)  . 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 Central serous retinopathy   . Fatty liver   . GERD (gastroesophageal reflux disease)   . Gestational diabetes   . Headache    migraines  . Heart murmur   . History of abnormal mammogram 08/2006, 01/2008, 03/2009  . Hypertension   . IBS (irritable bowel syndrome)   . Joint disorder    hx of left ankle tendonitis, bursitis, heel spur  . Obesity 2018   BMI 45  . Pelvic pain    adhesions and adenomyosis  . Rosacea     Past Surgical History:  Procedure Laterality Date  . BREAST BIOPSY Left 08/2006   benign fibroadenoma  . BREAST EXCISIONAL BIOPSY Right 02/26/2008   right breast invasive mam ca with rad partial mastectomy   . BREAST SURGERY Right    x2/ Dr Tamala Julian  . CARDIAC CATHETERIZATION  2006  . CESAREAN SECTION  04/02/1998  . CHOLECYSTECTOMY  04/2010   Dr. Smith/ laprascopic surgery  . COLONOSCOPY  04/04/2000  . ESOPHAGOGASTRODUODENOSCOPY (EGD) WITH PROPOFOL N/A 05/01/2015   Procedure:  ESOPHAGOGASTRODUODENOSCOPY (EGD) WITH PROPOFOL;  Surgeon: Hulen Luster, MD;  Location: [facility] ENDOSCOPY;  Service: Gastroenterology;  Laterality: N/A;  . EYE SURGERY  08/2012   laser surgery on retina/ DR Appenzeller  . FINGER SURGERY     repair of tendon in right index, Dr. Francesco Sor in [place]  . FOOT SURGERY     Dr. Milinda Pointer  . IRRIGATION AND DEBRIDEMENT SEBACEOUS CYST     right upper back  . LAPAROSCOPIC SUPRACERVICAL HYSTERECTOMY  06/2007   Dr Kincius/ adhesions/CPP/adenomyosis  . MASTECTOMY PARTIAL / LUMPECTOMY Right 01/2008   with sentinal lymph node biopsy/ infiltrating ductal cancer  . SKIN CANCER EXCISION     back and calves  . WISDOM TOOTH EXTRACTION  1991    There were no vitals filed for this visit.  Subjective Assessment - 09/27/18 1506    Subjective  Pt reports pain is worse this date, unclear as to why, however she thinks it may be related to not performing her HEP this morninging.     Pertinent History   Patient reports 07/19/2018 after having stepped hard on her left foot  not using the cam walker using her regular tennis shoes she had a sharp pain around the cuboid and radiating up her leg.  Radiographs show a  tear in  the peroneus longus tendon.  She has tenderness on palpation of the cuboid    Currently in Pain?  Yes    Pain Score  4     Pain Location  --   Left lateral ankle near the peroneal tendon complex   Pain Orientation  Left       INTERVENTION Therapeutic Exercise: -sagittal plane heel slides on floor, with towel, with 3sec hold in DF/PF x3 minutes -transverse plane heel slides on floor, with towel, with 3sec hold in EV/IV x3 minutes -seated ankle PF 2x15x3secH  -seated ankle DF 2x15x3secH  -standing unshod gross toe extension 15x3secH -standing unshod hallux extension 15x3secH  -standing unshod digits 2-5 extension 15x3secH -Lateral weight shifting, unshod Rt to/from Lt *dicussion/education on potential help from compression sleeve for ankle during the  work day.   -Ice pack, wrapped about ankle and foot x5 minutes (performed at end of session, uncharged time)    PT Short Term Goals - 08/22/18 0916      PT SHORT TERM GOAL #1   Title  Patient will report a worst pain of 3/10 on VAS in left ankle to improve tolerance with ADLs and reduced symptoms with activities.     Time  4    Period  Weeks    Status  New    Target Date  09/19/18      PT SHORT TERM GOAL #2   Title  Patient will increase BLE gross strength to 3+/5, no pain as to improve functional strength for independent gait, increased standing tolerance and increased ADL ability.    Time  4    Period  Weeks    Status  New    Target Date  09/19/18        PT Long Term Goals - 08/22/18 9357      PT LONG TERM GOAL #1   Title  Patient will increase lower extremity functional scale to >60/80 to demonstrate improved functional mobility and increased tolerance with ADLs.     Time  8    Period  Weeks    Status  New    Target Date  10/16/18      PT LONG TERM GOAL #2   Title  Patient will report a worst pain of 1/10 on VAS in  left ankle  to improve tolerance with ADLs and reduced symptoms with activities.     Time  8    Period  Weeks    Status  New    Target Date  10/16/18      PT LONG TERM GOAL #3   Title  Patient will increase six minute walk test distance to >2000 for progression to community ambulator and improve gait ability    Time  8    Period  Weeks    Status  New    Target Date  10/16/18            Plan - 09/27/18 1511    Clinical Impression Statement  POC continued as with prior sessions. Progressed program this date to include more isometric activation through the ankles. Pt endorses additional pain upon arrival, however she reports no increased pain pertaining to activities within the session. Pt continues to demonstrate some swelling in about the ankle.  Mobility in ankel remains fair without significant limitations at this time. Pt progressing slowly  toward goals.     Rehab Potential  Fair    PT Frequency  2x / week    PT Duration  8 weeks    PT Treatment/Interventions  Electrical Stimulation;Aquatic Therapy;Cryotherapy;Moist Heat;Ultrasound;Gait training;Therapeutic activities;Therapeutic exercise;Patient/family education;Neuromuscular re-education;Manual techniques;Passive range of motion;Dry needling;Splinting;Taping    PT Next Visit Plan  [place], e-stim, strengthening left ankle    Consulted and Agree with Plan of Care  Patient       Patient will benefit from skilled therapeutic intervention in order to improve the following deficits and impairments:  Abnormal gait, Decreased endurance, Decreased mobility, Difficulty walking, Obesity, Decreased activity tolerance, Decreased strength, Impaired flexibility  Visit Diagnosis: Muscle weakness (generalized)  Difficulty in walking, not elsewhere classified     Problem List Patient Active Problem List   Diagnosis Date Noted  . 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 OSA on CPAP 05/15/2018  . Non-alcoholic fatty liver disease 03/06/2017  . Rosacea   . GERD (gastroesophageal reflux disease)   . Central serous retinopathy   . IBS (irritable bowel syndrome)   . Hyperlipidemia, mixed 01/24/2017  . Dyspnea on effort 12/22/2016  . Anxiety, generalized 04/29/2016  . Diabetes mellitus without complication ([place]) 84/06/8207  . Cancer of the skin, basal cell 03/12/2016  . Contact dermatitis 04/23/2013  . Nodule, subcutaneous 07/24/2012  . Localized swelling, mass and lump, unspecified 07/24/2012  . Hypertension 12/20/2011  . Personal history of breast cancer 12/20/2011  . Obesity 12/20/2011   3:34 PM, 09/27/18 Etta Grandchild, PT, DPT Physical Therapist - [place] [phone]    Etta Grandchild 09/27/2018, 3:19 PM  [place] MAIN [facility] SERVICES [address] [place], [place], [address] Phone: [phone]   Fax:  [phone]  Name: Jacqueline Deleon MRN: [id number] Date of Birth: 17-Jul-1960

## 2018-09-28 ENCOUNTER — Encounter: Payer: 59 | Admitting: Physical Therapy

## 2018-10-02 ENCOUNTER — Ambulatory Visit: Payer: 59 | Admitting: Physical Therapy

## 2018-10-03 ENCOUNTER — Encounter: Payer: 59 | Admitting: Physical Therapy

## 2018-10-03 DIAGNOSIS — M9903 Segmental and somatic dysfunction of lumbar region: Secondary | ICD-10-CM | POA: Diagnosis not present

## 2018-10-03 DIAGNOSIS — M5412 Radiculopathy, cervical region: Secondary | ICD-10-CM | POA: Diagnosis not present

## 2018-10-03 DIAGNOSIS — M9902 Segmental and somatic dysfunction of thoracic region: Secondary | ICD-10-CM | POA: Diagnosis not present

## 2018-10-03 DIAGNOSIS — M9901 Segmental and somatic dysfunction of cervical region: Secondary | ICD-10-CM | POA: Diagnosis not present

## 2018-10-03 DIAGNOSIS — M9904 Segmental and somatic dysfunction of sacral region: Secondary | ICD-10-CM | POA: Diagnosis not present

## 2018-10-03 DIAGNOSIS — M542 Cervicalgia: Secondary | ICD-10-CM | POA: Diagnosis not present

## 2018-10-03 DIAGNOSIS — M6080 Other myositis, unspecified site: Secondary | ICD-10-CM | POA: Diagnosis not present

## 2018-10-03 DIAGNOSIS — R51 Headache: Secondary | ICD-10-CM | POA: Diagnosis not present

## 2018-10-03 DIAGNOSIS — M545 Low back pain: Secondary | ICD-10-CM | POA: Diagnosis not present

## 2018-10-04 ENCOUNTER — Other Ambulatory Visit: Payer: Self-pay

## 2018-10-04 ENCOUNTER — Ambulatory Visit: Payer: 59

## 2018-10-04 DIAGNOSIS — M6281 Muscle weakness (generalized): Secondary | ICD-10-CM | POA: Diagnosis not present

## 2018-10-04 DIAGNOSIS — R262 Difficulty in walking, not elsewhere classified: Secondary | ICD-10-CM

## 2018-10-04 NOTE — Therapy (Signed)
[place] [facility] [address], [place], [address] Phone: [phone]   Fax:  [phone]  Physical Therapy Treatment  Patient Details  Name: Jacqueline Deleon MRN: [id number] Date of Birth: March 12, 1961 Referring Provider (PT): Derinda Late   Encounter Date: 10/04/2018  PT End of Session - 10/04/18 1602    Visit Number  10    Number of Visits  17    Date for PT Re-Evaluation  10/16/18    PT Start Time  1603    PT Stop Time  1651    PT Time Calculation (min)  48 min    Activity Tolerance  Patient tolerated treatment well;Patient limited by pain    Behavior During Therapy  [facility] for tasks assessed/performed       Past Medical History:  Diagnosis Date  . Arthritis   . 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 Breast mass 08/2006, 03/2009   left breast biopsy fibroadenoma (2008) right breast biopsy benign (2010)  . 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 Central serous retinopathy   . Fatty liver   . GERD (gastroesophageal reflux disease)   . Gestational diabetes   . Headache    migraines  . Heart murmur   . History of abnormal mammogram 08/2006, 01/2008, 03/2009  . Hypertension   . IBS (irritable bowel syndrome)   . Joint disorder    hx of left ankle tendonitis, bursitis, heel spur  . Obesity 2018   BMI 45  . Pelvic pain    adhesions and adenomyosis  . Rosacea     Past Surgical History:  Procedure Laterality Date  . BREAST BIOPSY Left 08/2006   benign fibroadenoma  . BREAST EXCISIONAL BIOPSY Right 02/26/2008   right breast invasive mam ca with rad partial mastectomy   . BREAST SURGERY Right    x2/ Dr Tamala Julian  . CARDIAC CATHETERIZATION  2006  . CESAREAN SECTION  04/02/1998  . CHOLECYSTECTOMY  04/2010   Dr. Smith/ laprascopic surgery  . COLONOSCOPY  04/04/2000  . ESOPHAGOGASTRODUODENOSCOPY (EGD) WITH PROPOFOL N/A 05/01/2015   Procedure:  ESOPHAGOGASTRODUODENOSCOPY (EGD) WITH PROPOFOL;  Surgeon: Hulen Luster, MD;  Location: [facility] ENDOSCOPY;  Service: Gastroenterology;  Laterality: N/A;  . EYE SURGERY  08/2012   laser surgery on retina/ DR Appenzeller  . FINGER SURGERY     repair of tendon in right index, Dr. Francesco Sor in [place]  . FOOT SURGERY     Dr. Milinda Pointer  . IRRIGATION AND DEBRIDEMENT SEBACEOUS CYST     right upper back  . LAPAROSCOPIC SUPRACERVICAL HYSTERECTOMY  06/2007   Dr Kincius/ adhesions/CPP/adenomyosis  . MASTECTOMY PARTIAL / LUMPECTOMY Right 01/2008   with sentinal lymph node biopsy/ infiltrating ductal cancer  . SKIN CANCER EXCISION     back and calves  . WISDOM TOOTH EXTRACTION  1991    There were no vitals filed for this visit.  Subjective Assessment - 10/04/18 1604    Subjective  L foot does not feel to bad today. Feels symptoms with L ankle EV. Was ok after last session. Was achy after last session. Ice helped. L foot was back to the "new" normal.  1-2/10 L foot/ankle pain with CAM boot. Feels like physical therapy has helped.     Pertinent History   Patient reports 07/19/2018 after having stepped hard on her left foot  not using the cam walker using  her regular tennis shoes she had a sharp pain around the cuboid and radiating up her leg.  Radiographs show a  tear in the peroneus longus tendon.  She has tenderness on palpation of the cuboid    Currently in Pain?  Other (Comment)   no complain of pain, no pain level provided.                               PT Education - 10/04/18 1709    Education Details  ther-ex    Person(s) Educated  Patient    Methods  Explanation;Demonstration;Tactile cues;Verbal cues    Comprehension  Returned demonstration;Verbalized understanding     Objectives  Manual therapy:   STM to L peroneal longus tendon  Effleurage L leg, ankle and foot to help decrease swelling.   Supine with L LE propped   Therapeutic Exercise:  Isometric manually  resisted  PF 10x3 with 5 second holds  EV 10x3 with 5 second holds   IV 10x3 with 5 second holds    No complain of pain  With CAM boot: SLS on L LE, no UE assist 10 seconds   Then with R tip toe assist 14 seconds.   L lateral foot discomfort  Side stepping 32 ft to the L and 32 ft to the R to promote glute med muscle use and ankle muscle use  Forward wedding march 32 ft to promote glute med muscle use and ankle muscle use   L lateral foot discomfort with side stepping and wedding march  Standing with gentle manual perturbation B feet on floor, flat surface 1 min x 2  L lateral foot and anterior ankle discomfort towards end of 2nd minute. Sitting helps ease discomfort.   Improved exercise technique, movement at target joints, use of target muscles after min to mod verbal, visual, tactile cues.   Response to treatment Fair tolerance to today's session. No pain with isometric ankle exercises. Discomfort with closed chain activities with CAM boot.    Clinical impression Worked on manual therapy to help decrease L ankle swelling as well as gentle muscle activation around her ankle to help promote stability and healing of her tendon. Fair tolerance to today's session. Pt will benefit from continued skilled physical therapy services to decrease pain, improve strength, function and ability to ambulate more comfortably.     PT Short Term Goals - 08/22/18 0916      PT SHORT TERM GOAL #1   Title  Patient will report a worst pain of 3/10 on VAS in left ankle to improve tolerance with ADLs and reduced symptoms with activities.     Time  4    Period  Weeks    Status  New    Target Date  09/19/18      PT SHORT TERM GOAL #2   Title  Patient will increase BLE gross strength to 3+/5, no pain as to improve functional strength for independent gait, increased standing tolerance and increased ADL ability.    Time  4    Period  Weeks    Status  New    Target Date  09/19/18        PT Long  Term Goals - 08/22/18 0160      PT LONG TERM GOAL #1   Title  Patient will increase lower extremity functional scale to >60/80 to demonstrate improved functional mobility and increased tolerance with ADLs.  Time  8    Period  Weeks    Status  New    Target Date  10/16/18      PT LONG TERM GOAL #2   Title  Patient will report a worst pain of 1/10 on VAS in  left ankle  to improve tolerance with ADLs and reduced symptoms with activities.     Time  8    Period  Weeks    Status  New    Target Date  10/16/18      PT LONG TERM GOAL #3   Title  Patient will increase six minute walk test distance to >2000 for progression to community ambulator and improve gait ability    Time  8    Period  Weeks    Status  New    Target Date  10/16/18            Plan - 10/04/18 1710    Clinical Impression Statement  Worked on manual therapy to help decrease L ankle swelling as well as gentle muscle activation around her ankle to help promote stability and healing of her tendon. Fair tolerance to today's session. Pt will benefit from continued skilled physical therapy services to decrease pain, improve strength, function and ability to ambulate more comfortably.     Rehab Potential  Fair    PT Frequency  2x / week    PT Duration  8 weeks    PT Treatment/Interventions  Electrical Stimulation;Aquatic Therapy;Cryotherapy;Moist Heat;Ultrasound;Gait training;Therapeutic activities;Therapeutic exercise;Patient/family education;Neuromuscular re-education;Manual techniques;Passive range of motion;Dry needling;Splinting;Taping    PT Next Visit Plan  [place], e-stim, strengthening left ankle    Consulted and Agree with Plan of Care  Patient       Patient will benefit from skilled therapeutic intervention in order to improve the following deficits and impairments:  Abnormal gait, Decreased endurance, Decreased mobility, Difficulty walking, Obesity, Decreased activity tolerance, Decreased strength, Impaired  flexibility  Visit Diagnosis: Muscle weakness (generalized)  Difficulty in walking, not elsewhere classified     Problem List Patient Active Problem List   Diagnosis Date Noted  . 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 OSA on CPAP 05/15/2018  . Non-alcoholic fatty liver disease 03/06/2017  . Rosacea   . GERD (gastroesophageal reflux disease)   . Central serous retinopathy   . IBS (irritable bowel syndrome)   . Hyperlipidemia, mixed 01/24/2017  . Dyspnea on effort 12/22/2016  . Anxiety, generalized 04/29/2016  . Diabetes mellitus without complication ([place]) 91/47/8295  . Cancer of the skin, basal cell 03/12/2016  . Contact dermatitis 04/23/2013  . Nodule, subcutaneous 07/24/2012  . Localized swelling, mass and lump, unspecified 07/24/2012  . Hypertension 12/20/2011  . Personal history of breast cancer 12/20/2011  . Obesity 12/20/2011   Joneen Boers PT, DPT   10/04/2018, 5:14 PM  [place] [facility] [address] [place], [address] Phone: [phone]   Fax:  [phone]  Name: Jacqueline Deleon MRN: [id number] Date of Birth: 1961-06-09

## 2018-10-04 NOTE — Patient Instructions (Signed)
Added supine L ankle pumps with L LE propped at least 30x to help decrease ankle swelling. Pt demonstrated and verbalized understanding.

## 2018-10-05 ENCOUNTER — Encounter: Payer: 59 | Admitting: Physical Therapy

## 2018-10-09 ENCOUNTER — Other Ambulatory Visit: Payer: Self-pay

## 2018-10-09 ENCOUNTER — Ambulatory Visit: Payer: 59

## 2018-10-09 DIAGNOSIS — R262 Difficulty in walking, not elsewhere classified: Secondary | ICD-10-CM | POA: Diagnosis not present

## 2018-10-09 DIAGNOSIS — M6281 Muscle weakness (generalized): Secondary | ICD-10-CM | POA: Diagnosis not present

## 2018-10-09 NOTE — Therapy (Signed)
[place] [facility] [address], [place], [address] Phone: [phone]   Fax:  [phone]  Physical Therapy Treatment  Patient Details  Name: Jacqueline Deleon MRN: [id number] Date of Birth: [birth year] Referring Provider (PT): Derinda Late   Encounter Date: 10/09/2018  PT End of Session - 10/09/18 1601    Visit Number  11    Number of Visits  17    Date for PT Re-Evaluation  10/16/18    PT Start Time  1601    PT Stop Time  1643    PT Time Calculation (min)  42 min    Activity Tolerance  Patient tolerated treatment well;Patient limited by pain    Behavior During Therapy  [facility] for tasks assessed/performed       Past Medical History:  Diagnosis Date  . Arthritis   . 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 Breast mass 08/2006, 03/2009   left breast biopsy fibroadenoma (2008) right breast biopsy benign (2010)  . 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 Central serous retinopathy   . Fatty liver   . GERD (gastroesophageal reflux disease)   . Gestational diabetes   . Headache    migraines  . Heart murmur   . History of abnormal mammogram 08/2006, 01/2008, 03/2009  . Hypertension   . IBS (irritable bowel syndrome)   . Joint disorder    hx of left ankle tendonitis, bursitis, heel spur  . Obesity 2018   BMI 45  . Pelvic pain    adhesions and adenomyosis  . Rosacea     Past Surgical History:  Procedure Laterality Date  . BREAST BIOPSY Left 08/2006   benign fibroadenoma  . BREAST EXCISIONAL BIOPSY Right 02/26/2008   right breast invasive mam ca with rad partial mastectomy   . BREAST SURGERY Right    x2/ Dr Tamala Julian  . CARDIAC CATHETERIZATION  2006  . CESAREAN SECTION  04/02/1998  . CHOLECYSTECTOMY  04/2010   Dr. Smith/ laprascopic surgery  . COLONOSCOPY  04/04/2000  . ESOPHAGOGASTRODUODENOSCOPY (EGD) WITH PROPOFOL N/A 05/01/2015   Procedure:  ESOPHAGOGASTRODUODENOSCOPY (EGD) WITH PROPOFOL;  Surgeon: Hulen Luster, MD;  Location: [facility] ENDOSCOPY;  Service: Gastroenterology;  Laterality: N/A;  . EYE SURGERY  08/2012   laser surgery on retina/ DR Appenzeller  . FINGER SURGERY     repair of tendon in right index, Dr. Francesco Sor in [place]  . FOOT SURGERY     Dr. Milinda Pointer  . IRRIGATION AND DEBRIDEMENT SEBACEOUS CYST     right upper back  . LAPAROSCOPIC SUPRACERVICAL HYSTERECTOMY  06/2007   Dr Kincius/ adhesions/CPP/adenomyosis  . MASTECTOMY PARTIAL / LUMPECTOMY Right 01/2008   with sentinal lymph node biopsy/ infiltrating ductal cancer  . SKIN CANCER EXCISION     back and calves  . WISDOM TOOTH EXTRACTION  1991    There were no vitals filed for this visit.  Subjective Assessment - 10/09/18 1603    Subjective  L foot is not bad. No L foot pain when walking with boot. Feels like L foot is making progress. 2/10 L foot pain at worst with CAM boot for the past 7 days.  L foot was not bad after last session.     Pertinent History   Patient reports 07/19/2018 after having stepped hard on her left foot  not using the cam walker using her regular tennis shoes she  had a sharp pain around the cuboid and radiating up her leg.  Radiographs show a  tear in the peroneus longus tendon.  She has tenderness on palpation of the cuboid    Currently in Pain?  No/denies    Pain Score  0-No pain                               PT Education - 10/09/18 1636    Education Details  ther-ex    Person(s) Educated  Patient    Methods  Explanation;Demonstration;Tactile cues;Verbal cues    Comprehension  Returned demonstration;Verbalized understanding        Objectives  Manual therapy:   Effleurage L leg, ankle and foot to help decrease swelling.              Supine with L LE propped    Therapeutic Exercise:  Isometric manually resisted. Performed after manual therapy             PF 10x3 not isometrics (concentric and  eccentric)             EV 10x3 with 5 second holds              IV 10x3 with 5 second holds  DF 10x3 concentric contraction                            No complain of pain. Decreased swelling L ankle observed   With CAM boot:  6 minute walk  1325 ft. Plantar foot pain.    Get LEFS score next visit if able    Improved exercise technique, movement at target joints, use of target muscles after min to mod verbal, visual, tactile cues.   Response to treatment Decreased L ankle swelling observed following manual therapy to decrease swelling as well as with muscle activation around the ankle.    Clinical impression Pt seems to be improving with ankle pain levels wearing her CAM boot. Able to provide about 4+/5 resistance with resisted ankle exercises in supine, no complain of pain today. Able to ambulate same distance during 6 minute walk test while wearing her CAM boot. Some plantar foot discomfort towards last 2 minutes of 6 minute walk test. Pt will benefit from continued skilled physical therapy services to decrease pain, improve strength, function, and ability to ambulate with minimal to no L foot pain.        PT Short Term Goals - 08/22/18 0916      PT SHORT TERM GOAL #1   Title  Patient will report a worst pain of 3/10 on VAS in left ankle to improve tolerance with ADLs and reduced symptoms with activities.     Time  4    Period  Weeks    Status  New    Target Date  09/19/18      PT SHORT TERM GOAL #2   Title  Patient will increase BLE gross strength to 3+/5, no pain as to improve functional strength for independent gait, increased standing tolerance and increased ADL ability.    Time  4    Period  Weeks    Status  New    Target Date  09/19/18        PT Long Term Goals - 08/22/18 0923      PT LONG TERM GOAL #1   Title  Patient will increase lower extremity  functional scale to >60/80 to demonstrate improved functional mobility and increased tolerance with  ADLs.     Time  8    Period  Weeks    Status  New    Target Date  10/16/18      PT LONG TERM GOAL #2   Title  Patient will report a worst pain of 1/10 on VAS in  left ankle  to improve tolerance with ADLs and reduced symptoms with activities.     Time  8    Period  Weeks    Status  New    Target Date  10/16/18      PT LONG TERM GOAL #3   Title  Patient will increase six minute walk test distance to >2000 for progression to community ambulator and improve gait ability    Time  8    Period  Weeks    Status  New    Target Date  10/16/18            Plan - 10/09/18 1553    Clinical Impression Statement  Pt seems to be improving with ankle pain levels wearing her CAM boot. Able to provide about 4+/5 resistance with resisted ankle exercises in supine, no complain of pain today. Able to ambulate same distance during 6 minute walk test while wearing her CAM boot. Some plantar foot discomfort towards last 2 minutes of 6 minute walk test. Pt will benefit from continued skilled physical therapy services to decrease pain, improve strength, function, and ability to ambulate with minimal to no L foot pain.     Rehab Potential  Fair    PT Frequency  2x / week    PT Duration  8 weeks    PT Treatment/Interventions  Electrical Stimulation;Aquatic Therapy;Cryotherapy;Moist Heat;Ultrasound;Gait training;Therapeutic activities;Therapeutic exercise;Patient/family education;Neuromuscular re-education;Manual techniques;Passive range of motion;Dry needling;Splinting;Taping    PT Next Visit Plan  [place], e-stim, strengthening left ankle    Consulted and Agree with Plan of Care  Patient       Patient will benefit from skilled therapeutic intervention in order to improve the following deficits and impairments:  Abnormal gait, Decreased endurance, Decreased mobility, Difficulty walking, Obesity, Decreased activity tolerance, Decreased strength, Impaired flexibility  Visit Diagnosis: Muscle weakness  (generalized)  Difficulty in walking, not elsewhere classified     Problem List Patient Active Problem List   Diagnosis Date Noted  . 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 OSA on CPAP 05/15/2018  . Non-alcoholic fatty liver disease 03/06/2017  . Rosacea   . GERD (gastroesophageal reflux disease)   . Central serous retinopathy   . IBS (irritable bowel syndrome)   . Hyperlipidemia, mixed 01/24/2017  . Dyspnea on effort 12/22/2016  . Anxiety, generalized 04/29/2016  . Diabetes mellitus without complication ([place]) 58/03/9832  . Cancer of the skin, basal cell 03/12/2016  . Contact dermatitis 04/23/2013  . Nodule, subcutaneous 07/24/2012  . Localized swelling, mass and lump, unspecified 07/24/2012  . Hypertension 12/20/2011  . Personal history of breast cancer 12/20/2011  . Obesity 12/20/2011    Joneen Boers PT, DPT   10/09/2018, 4:53 PM  [place] [facility] [address] [place], [address] Phone: [phone]   Fax:  [phone]  Name: Jacqueline Deleon MRN: [id number] Date of Birth: September 05, 1960

## 2018-10-10 ENCOUNTER — Encounter: Payer: 59 | Admitting: Physical Therapy

## 2018-10-11 ENCOUNTER — Ambulatory Visit: Payer: 59 | Attending: Podiatry

## 2018-10-11 ENCOUNTER — Other Ambulatory Visit: Payer: Self-pay

## 2018-10-11 DIAGNOSIS — R262 Difficulty in walking, not elsewhere classified: Secondary | ICD-10-CM

## 2018-10-11 DIAGNOSIS — M6281 Muscle weakness (generalized): Secondary | ICD-10-CM | POA: Diagnosis not present

## 2018-10-11 NOTE — Patient Instructions (Signed)
Pt was recomended to perform seated L gastroc stretch using strap or towel 3x30 seconds 3x/day as part of her HEP. Pt verbalized understanding.   Pt was also recommended for her husband to massage her L lateral gastroc to help decrease tension and decrease L lateral plantar pain when walking. Pt verbalized understanding.

## 2018-10-11 NOTE — Therapy (Signed)
[place] [facility] [address], [place], [address] Phone: [phone]   Fax:  [phone]  Physical Therapy Treatment  Patient Details  Name: Jacqueline Deleon MRN: [id number] Date of Birth: 26-Mar-1961 Referring Provider (PT): Derinda Late   Encounter Date: 10/11/2018  PT End of Session - 10/11/18 1601    Visit Number  12    Number of Visits  17    Date for PT Re-Evaluation  10/16/18    PT Start Time  1601    PT Stop Time  1650    PT Time Calculation (min)  49 min    Activity Tolerance  Patient tolerated treatment well;Patient limited by pain    Behavior During Therapy  [facility] for tasks assessed/performed       Past Medical History:  Diagnosis Date  . Arthritis   . 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 Breast mass 08/2006, 03/2009   left breast biopsy fibroadenoma (2008) right breast biopsy benign (2010)  . 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 Central serous retinopathy   . Fatty liver   . GERD (gastroesophageal reflux disease)   . Gestational diabetes   . Headache    migraines  . Heart murmur   . History of abnormal mammogram 08/2006, 01/2008, 03/2009  . Hypertension   . IBS (irritable bowel syndrome)   . Joint disorder    hx of left ankle tendonitis, bursitis, heel spur  . Obesity 2018   BMI 45  . Pelvic pain    adhesions and adenomyosis  . Rosacea     Past Surgical History:  Procedure Laterality Date  . BREAST BIOPSY Left 08/2006   benign fibroadenoma  . BREAST EXCISIONAL BIOPSY Right 02/26/2008   right breast invasive mam ca with rad partial mastectomy   . BREAST SURGERY Right    x2/ Dr Tamala Julian  . CARDIAC CATHETERIZATION  2006  . CESAREAN SECTION  04/02/1998  . CHOLECYSTECTOMY  04/2010   Dr. Smith/ laprascopic surgery  . COLONOSCOPY  04/04/2000  . ESOPHAGOGASTRODUODENOSCOPY (EGD) WITH PROPOFOL N/A 05/01/2015   Procedure:  ESOPHAGOGASTRODUODENOSCOPY (EGD) WITH PROPOFOL;  Surgeon: Hulen Luster, MD;  Location: [facility] ENDOSCOPY;  Service: Gastroenterology;  Laterality: N/A;  . EYE SURGERY  08/2012   laser surgery on retina/ DR Appenzeller  . FINGER SURGERY     repair of tendon in right index, Dr. Francesco Sor in [place]  . FOOT SURGERY     Dr. Milinda Pointer  . IRRIGATION AND DEBRIDEMENT SEBACEOUS CYST     right upper back  . LAPAROSCOPIC SUPRACERVICAL HYSTERECTOMY  06/2007   Dr Kincius/ adhesions/CPP/adenomyosis  . MASTECTOMY PARTIAL / LUMPECTOMY Right 01/2008   with sentinal lymph node biopsy/ infiltrating ductal cancer  . SKIN CANCER EXCISION     back and calves  . WISDOM TOOTH EXTRACTION  1991    There were no vitals filed for this visit.  Subjective Assessment - 10/11/18 1602    Subjective  L foot is pretty good. No pain currently. Walking is fine with the boot on. Very little L foot pain with short distance walking at home without the boot. Can feel the symptoms when she is ready to get dressed and get into the boot. Not as bad as it has been.     Pertinent History   Patient reports 07/19/2018 after having stepped hard on her left foot  not using  the cam walker using her regular tennis shoes she had a sharp pain around the cuboid and radiating up her leg.  Radiographs show a  tear in the peroneus longus tendon.  She has tenderness on palpation of the cuboid    Currently in Pain?  No/denies    Pain Score  0-No pain         [facility] PT Assessment - 10/11/18 1704      Observation/Other Assessments   Lower Extremity Functional Scale   56/80                           PT Education - 10/11/18 1704    Education Details  ther-ex, HEP    Person(s) Educated  Patient    Methods  Explanation;Demonstration;Tactile cues;Verbal cues    Comprehension  Returned demonstration;Verbalized understanding      Objectives  Manual therapy:  Low dye taping to L foot to support arch  Able to ambulate about 80  ft with minimal L lateral plantar discomfort. No tendon pain around lateral malleolus.   Seated STM L peroneal muscles  seated A to P to L talocrural joint, grade 3-   Still had L lateral plantar discomfort with gait. No tendon pain around lateral malleolus.    Supine effluerage L leg, and ankle to help decrease swelling  prone STM L lateral gastroc muscle to decrease tension   No L foot pain after walking 80 ft      Therapeutic Exercise:  Recert next visit (0/09/5007) if appropriate.   Supine manually resisted strengthening. Performed after manual therapy PF 10x3 not isometrics (concentric and eccentric) EV 10x3 gentle concentric and eccentric. Slight discomfort which eases quickly with rest with first set of 10. No symptoms with the next 2 sets of 10 IV 10x3 concentric and eccentric             DF 10x3 concentric and ecentric contraction    Decreased L lateral plantar foot discomfort with gait of about 80 ft afterwards  Gait multiple times without CAM boot on to assess effectiveness of treatments  Improved exercise technique, movement at target joints, use of target muscles after min verbal, visual, tactile cues.    Response to treatment Decreased L plantar foot pain with gait without CAM boot after treatment. Pt tolerated session well without aggravation of symptoms.    Clinical impression Continued working on L ankle strengthening as well as decreasing muscle tension at her leg and decreasing swelling. Better able to tolerate resisted L ankle eversion with no complain of pain during last 2 sets of 10. Pt also demonstrates decreased L lateral plantar pain with gait without CAM boot following treatment to improve L ankle strength as well as decreasing L lateral gastroc muscle tension to help decrease L calcaneal eversion during stance phase. Pt will benefit from continued skilled physical therapy services to decrease pain,  improve strength and function.       PT Short Term Goals - 08/22/18 0916      PT SHORT TERM GOAL #1   Title  Patient will report a worst pain of 3/10 on VAS in left ankle to improve tolerance with ADLs and reduced symptoms with activities.     Time  4    Period  Weeks    Status  New    Target Date  09/19/18      PT SHORT TERM GOAL #2   Title  Patient will increase BLE  gross strength to 3+/5, no pain as to improve functional strength for independent gait, increased standing tolerance and increased ADL ability.    Time  4    Period  Weeks    Status  New    Target Date  09/19/18        PT Long Term Goals - 08/22/18 7035      PT LONG TERM GOAL #1   Title  Patient will increase lower extremity functional scale to >60/80 to demonstrate improved functional mobility and increased tolerance with ADLs.     Time  8    Period  Weeks    Status  New    Target Date  10/16/18      PT LONG TERM GOAL #2   Title  Patient will report a worst pain of 1/10 on VAS in  left ankle  to improve tolerance with ADLs and reduced symptoms with activities.     Time  8    Period  Weeks    Status  New    Target Date  10/16/18      PT LONG TERM GOAL #3   Title  Patient will increase six minute walk test distance to >2000 for progression to community ambulator and improve gait ability    Time  8    Period  Weeks    Status  New    Target Date  10/16/18            Plan - 10/11/18 1601    Clinical Impression Statement  Continued working on L ankle strengthening as well as decreasing muscle tension at her leg and decreasing swelling. Better able to tolerate resisted L ankle eversion with no complain of pain during last 2 sets of 10. Pt also demonstrates decreased L lateral plantar pain with gait without CAM boot following treatment to improve L ankle strength as well as decreasing L lateral gastroc muscle tension to help decrease L calcaneal eversion during stance phase. Pt will benefit from  continued skilled physical therapy services to decrease pain, improve strength and function.     Rehab Potential  Fair    PT Frequency  2x / week    PT Duration  8 weeks    PT Treatment/Interventions  Electrical Stimulation;Aquatic Therapy;Cryotherapy;Moist Heat;Ultrasound;Gait training;Therapeutic activities;Therapeutic exercise;Patient/family education;Neuromuscular re-education;Manual techniques;Passive range of motion;Dry needling;Splinting;Taping    PT Next Visit Plan  [place], e-stim, strengthening left ankle    Consulted and Agree with Plan of Care  Patient       Patient will benefit from skilled therapeutic intervention in order to improve the following deficits and impairments:  Abnormal gait, Decreased endurance, Decreased mobility, Difficulty walking, Obesity, Decreased activity tolerance, Decreased strength, Impaired flexibility  Visit Diagnosis: Muscle weakness (generalized)  Difficulty in walking, not elsewhere classified     Problem List Patient Active Problem List   Diagnosis Date Noted  . 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 OSA on CPAP 05/15/2018  . Non-alcoholic fatty liver disease 03/06/2017  . Rosacea   . GERD (gastroesophageal reflux disease)   . Central serous retinopathy   . IBS (irritable bowel syndrome)   . Hyperlipidemia, mixed 01/24/2017  . Dyspnea on effort 12/22/2016  . Anxiety, generalized 04/29/2016  . Diabetes mellitus without complication ([place]) 00/93/8182  . Cancer of the skin, basal cell 03/12/2016  . Contact dermatitis 04/23/2013  . Nodule, subcutaneous 07/24/2012  . Localized swelling, mass and lump, unspecified 07/24/2012  . Hypertension 12/20/2011  . Personal  history of breast cancer 12/20/2011  . Obesity 12/20/2011    Joneen Boers PT, DPT   10/11/2018, 5:14 PM  [place] [facility] [address] [place], [address] Phone: [phone]   Fax:   [phone]  Name: Jacqueline Deleon MRN: [id number] Date of Birth: 24-Jul-1960

## 2018-10-12 ENCOUNTER — Encounter: Payer: 59 | Admitting: Physical Therapy

## 2018-10-16 ENCOUNTER — Ambulatory Visit: Payer: 59

## 2018-10-16 ENCOUNTER — Other Ambulatory Visit: Payer: Self-pay

## 2018-10-16 DIAGNOSIS — R262 Difficulty in walking, not elsewhere classified: Secondary | ICD-10-CM

## 2018-10-16 DIAGNOSIS — M6281 Muscle weakness (generalized): Secondary | ICD-10-CM | POA: Diagnosis not present

## 2018-10-16 NOTE — Therapy (Signed)
[facility] [address] [place], [address] Phone: [phone]   Fax:  [phone]  Physical Therapy Treatment  Patient Details  Name: Jacqueline Deleon MRN: [id number] Date of Birth: 27-Jun-1961 Referring Provider (PT): Derinda Late   Encounter Date: 10/16/2018  PT End of Session - 10/16/18 1601    Visit Number  13    Number of Visits  25    Date for PT Re-Evaluation  11/09/18    PT Start Time  1601    PT Stop Time  1659    PT Time Calculation (min)  58 min    Activity Tolerance  Patient tolerated treatment well;Patient limited by pain    Behavior During Therapy  [facility] for tasks assessed/performed       Past Medical History:  Diagnosis Date  . Arthritis   . 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 Breast mass 08/2006, 03/2009   left breast biopsy fibroadenoma (2008) right breast biopsy benign (2010)  . 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 Central serous retinopathy   . Fatty liver   . GERD (gastroesophageal reflux disease)   . Gestational diabetes   . Headache    migraines  . Heart murmur   . History of abnormal mammogram 08/2006, 01/2008, 03/2009  . Hypertension   . IBS (irritable bowel syndrome)   . Joint disorder    hx of left ankle tendonitis, bursitis, heel spur  . Obesity 2018   BMI 45  . Pelvic pain    adhesions and adenomyosis  . Rosacea     Past Surgical History:  Procedure Laterality Date  . BREAST BIOPSY Left 08/2006   benign fibroadenoma  . BREAST EXCISIONAL BIOPSY Right 02/26/2008   right breast invasive mam ca with rad partial mastectomy   . BREAST SURGERY Right    x2/ Dr Tamala Julian  . CARDIAC CATHETERIZATION  2006  . CESAREAN SECTION  04/02/1998  . CHOLECYSTECTOMY  04/2010   Dr. Smith/ laprascopic surgery  . COLONOSCOPY  04/04/2000  . ESOPHAGOGASTRODUODENOSCOPY (EGD) WITH PROPOFOL N/A 05/01/2015   Procedure:  ESOPHAGOGASTRODUODENOSCOPY (EGD) WITH PROPOFOL;  Surgeon: Hulen Luster, MD;  Location: [facility] ENDOSCOPY;  Service: Gastroenterology;  Laterality: N/A;  . EYE SURGERY  08/2012   laser surgery on retina/ DR Appenzeller  . FINGER SURGERY     repair of tendon in right index, Dr. Francesco Sor in [place]  . FOOT SURGERY     Dr. Milinda Pointer  . IRRIGATION AND DEBRIDEMENT SEBACEOUS CYST     right upper back  . LAPAROSCOPIC SUPRACERVICAL HYSTERECTOMY  06/2007   Dr Kincius/ adhesions/CPP/adenomyosis  . MASTECTOMY PARTIAL / LUMPECTOMY Right 01/2008   with sentinal lymph node biopsy/ infiltrating ductal cancer  . SKIN CANCER EXCISION     back and calves  . WISDOM TOOTH EXTRACTION  1991    There were no vitals filed for this visit.  Subjective Assessment - 10/16/18 1602    Subjective  The bottom outside foot started bothering her again. The L lateral ankle and anterior ankle are doing pretty good. L foot was good after last session.     Pertinent History   Patient reports 07/19/2018 after having stepped hard on her left foot  not using the cam walker using her regular tennis shoes she had a sharp pain around the cuboid and radiating up her leg.  Radiographs show a  tear in the peroneus longus tendon.  She has tenderness on palpation of the cuboid    Currently in Pain?  No/denies    Pain Score  0-No pain         [facility] PT Assessment - 10/16/18 1559      Observation/Other Assessments   Lower Extremity Functional Scale   56/80   measured on 10/11/2018     Strength   Overall Strength Comments  ankle strength measured with manual resistance    Right Hip Flexion  4-/5    Right Hip Extension  4-/5    Right Hip ABduction  4-/5    Left Hip Flexion  4/5    Left Hip Extension  4-/5    Left Hip ABduction  4/5    Right Knee Flexion  5/5    Right Knee Extension  5/5    Left Knee Flexion  5/5    Left Knee Extension  5/5    Right Ankle Dorsiflexion  5/5   manual resistance   Right Ankle Plantar Flexion  5/5    manual resistance   Left Ankle Dorsiflexion  5/5   manual resistance   Left Ankle Plantar Flexion  5/5   manual resistance; lateral plantar pain                          PT Education - 10/16/18 1613    Education Details  ther-ex    Person(s) Educated  Patient    Methods  Explanation;Demonstration;Tactile cues;Verbal cues    Comprehension  Returned demonstration;Verbalized understanding      Objectives   6 minute walk on 10/09/2018: 1325 ft. Plantar foot pain.     Manual therapy:   prone STM L lateral gastroc muscle to decrease tension        Supine effluerage L leg, and ankle to help decrease swelling   supine: STM L plantar fascia STM plantar lateral foot STM L heel fascial area  2/10 L lateral plantar foot pain with gait without boot on    Perform low dye tape again if appropriate next visit.   Therapeutic Exercise:  Seated manually resisted hip flexion, knee extension, knee flexion, ankle DF, ankle PF, S/L hip abduction, prone hip extension 1-2x each way for each LE  Supine manually resisted strengthening. Performed after manual therapy PF 10x3not isometrics (concentric and eccentric) EV 10x3 gentle concentric and eccentric. Slight lateral plantar foot discomfort at last set of 10 IV 10x3 concentric and eccentric DF 10x3 concentric and ecentric contraction  Supine manual L gastroc stretch 30 seconds x 4  No change 2/10 L lateral plantar foot pain afterwards with gait without CAM boot.   Reviewed progress/current status with PT with pt.   Reviewed plan of care: continue with PT 2x/week for 4 weeks  Try L LE neural tension testing to see if it reproduces L lateral plantar foot symptoms if appropriate next visit.    Improved exercise technique, movement at target joints, use of target muscles after min to mod verbal, visual, tactile cues.     Response to  treatment  Good muscle use felt with exercises. Pt tolerated session well without aggravation of symptoms.    Clinical impression Pt overall demonstrates improved L foot and ankle comfort level with the CAM boot with reports of 2/10 pain level at most for the past 7 days. Moreover, pt also demonstrates less to no lateral ankle and anterior ankle discomfort and now just mainly  feels lateral plantar foot symptoms when walking without her CAM boot for the past 2 visits. Pt also demonstrates at least 4-/5 B LE strength, meeting one of her PT goals. Pt still demonstrates L foot and ankle pain and swelling, LE weakness, and difficulty performing standing tasks such as ambulation due to her symptoms. Pt demonstrates potential for improvement. Pt will benefit from continued skilled physical therapy services to address to aforementioned deficits.      PT Short Term Goals - 10/16/18 1613      PT SHORT TERM GOAL #1   Title  Patient will report a worst pain of 3/10 on VAS in left ankle to improve tolerance with ADLs and reduced symptoms with activities.     Baseline  2/10 with CAM boot on at worst for the past 7 days (10/16/2018)    Time  4    Period  Weeks    Status  Partially Met    Target Date  09/19/18      PT SHORT TERM GOAL #2   Title  Patient will increase BLE gross strength to 3+/5, no pain as to improve functional strength for independent gait, increased standing tolerance and increased ADL ability.    Time  4    Period  Weeks    Status  Achieved    Target Date  09/19/18        PT Long Term Goals - 10/16/18 1614      PT LONG TERM GOAL #1   Title  Patient will increase lower extremity functional scale to >60/80 to demonstrate improved functional mobility and increased tolerance with ADLs.     Baseline  56/80 (10/11/2018)    Time  4    Period  Weeks    Status  On-going    Target Date  11/09/18      PT LONG TERM GOAL #2   Title  Patient will report a worst pain of 1/10 on VAS in   left ankle  to improve tolerance with ADLs and reduced symptoms with activities.     Baseline  2/10 L ankle pain with CAM boot on at most for the past 7 days (10/16/2018)    Time  4    Period  Weeks    Status  On-going    Target Date  11/09/18      PT LONG TERM GOAL #3   Title  Patient will increase six minute walk test distance to >2000 for progression to community ambulator and improve gait ability    Baseline  1325 ft (10/09/2018)    Time  4    Period  Weeks    Status  On-going    Target Date  11/09/18            Plan - 10/16/18 1559    Clinical Impression Statement  Pt overall demonstrates improved L foot and ankle comfort level with the CAM boot with reports of 2/10 pain level at most for the past 7 days. Moreover, pt also demonstrates less to no lateral ankle and anterior ankle discomfort and now just mainly feels lateral plantar foot symptoms when walking without her CAM boot for the past 2 visits. Pt also demonstrates at least 4-/5 B LE strength, meeting one of her PT goals. Pt still demonstrates L foot and ankle pain and swelling, LE weakness, and difficulty performing standing tasks such as ambulation due to her symptoms. Pt demonstrates potential for improvement. Pt will benefit from continued skilled physical therapy services  to address to aforementioned deficits.     Personal Factors and Comorbidities  Time since onset of injury/illness/exacerbation;Fitness    Examination-Activity Limitations  Squat;Locomotion Level;Stairs    Stability/Clinical Decision Making  Stable/Uncomplicated    Clinical Decision Making  Low    Rehab Potential  Fair    PT Frequency  2x / week    PT Duration  4 weeks    PT Treatment/Interventions  Electrical Stimulation;Aquatic Therapy;Cryotherapy;Moist Heat;Ultrasound;Gait training;Therapeutic activities;Therapeutic exercise;Patient/family education;Neuromuscular re-education;Manual techniques;Passive range of motion;Dry needling;Splinting;Taping    PT  Next Visit Plan  [place], e-stim, strengthening left ankle, manual techniques    Consulted and Agree with Plan of Care  Patient       Patient will benefit from skilled therapeutic intervention in order to improve the following deficits and impairments:  Abnormal gait, Decreased endurance, Decreased mobility, Difficulty walking, Obesity, Decreased activity tolerance, Decreased strength, Impaired flexibility  Visit Diagnosis: Muscle weakness (generalized) - Plan: PT plan of care cert/re-cert  Difficulty in walking, not elsewhere classified - Plan: PT plan of care cert/re-cert     Problem List Patient Active Problem List   Diagnosis Date Noted  . 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 OSA on CPAP 05/15/2018  . Non-alcoholic fatty liver disease 03/06/2017  . Rosacea   . GERD (gastroesophageal reflux disease)   . Central serous retinopathy   . IBS (irritable bowel syndrome)   . Hyperlipidemia, mixed 01/24/2017  . Dyspnea on effort 12/22/2016  . Anxiety, generalized 04/29/2016  . Diabetes mellitus without complication ([place]) 78/29/5621  . Cancer of the skin, basal cell 03/12/2016  . Contact dermatitis 04/23/2013  . Nodule, subcutaneous 07/24/2012  . Localized swelling, mass and lump, unspecified 07/24/2012  . Hypertension 12/20/2011  . Personal history of breast cancer 12/20/2011  . Obesity 12/20/2011    Joneen Boers PT, DPT   10/16/2018, 5:24 PM  [facility] [address], [place], [address] Phone: [phone]   Fax:  [phone]  Name: AJIAH MCGLINN MRN: [id number] Date of Birth: 1960-12-20

## 2018-10-17 ENCOUNTER — Encounter: Payer: 59 | Admitting: Physical Therapy

## 2018-10-18 ENCOUNTER — Other Ambulatory Visit: Payer: Self-pay

## 2018-10-18 ENCOUNTER — Ambulatory Visit: Payer: 59

## 2018-10-18 DIAGNOSIS — R262 Difficulty in walking, not elsewhere classified: Secondary | ICD-10-CM | POA: Diagnosis not present

## 2018-10-18 DIAGNOSIS — M6281 Muscle weakness (generalized): Secondary | ICD-10-CM | POA: Diagnosis not present

## 2018-10-18 NOTE — Therapy (Signed)
[facility] [address], [place], [address] Phone: [phone]   Fax:  [phone]  Physical Therapy Treatment  Patient Details  Name: Jacqueline Deleon MRN: [id number] Date of Birth: 1961-01-18 Referring Provider (PT): Derinda Late   Encounter Date: 10/18/2018  PT End of Session - 10/18/18 1302    Visit Number  14    Number of Visits  25    Date for PT Re-Evaluation  11/09/18    PT Start Time  1302    PT Stop Time  1358    PT Time Calculation (min)  56 min    Activity Tolerance  Patient tolerated treatment well;Patient limited by pain    Behavior During Therapy  [facility] for tasks assessed/performed       Past Medical History:  Diagnosis Date  . Arthritis   . 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 Breast mass 08/2006, 03/2009   left breast biopsy fibroadenoma (2008) right breast biopsy benign (2010)  . 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 Central serous retinopathy   . Fatty liver   . GERD (gastroesophageal reflux disease)   . Gestational diabetes   . Headache    migraines  . Heart murmur   . History of abnormal mammogram 08/2006, 01/2008, 03/2009  . Hypertension   . IBS (irritable bowel syndrome)   . Joint disorder    hx of left ankle tendonitis, bursitis, heel spur  . Obesity 2018   BMI 45  . Pelvic pain    adhesions and adenomyosis  . Rosacea     Past Surgical History:  Procedure Laterality Date  . BREAST BIOPSY Left 08/2006   benign fibroadenoma  . BREAST EXCISIONAL BIOPSY Right 02/26/2008   right breast invasive mam ca with rad partial mastectomy   . BREAST SURGERY Right    x2/ Dr Tamala Julian  . CARDIAC CATHETERIZATION  2006  . CESAREAN SECTION  04/02/1998  . CHOLECYSTECTOMY  04/2010   Dr. Smith/ laprascopic surgery  . COLONOSCOPY  04/04/2000  . ESOPHAGOGASTRODUODENOSCOPY (EGD) WITH PROPOFOL N/A 05/01/2015   Procedure:  ESOPHAGOGASTRODUODENOSCOPY (EGD) WITH PROPOFOL;  Surgeon: Hulen Luster, MD;  Location: [facility] ENDOSCOPY;  Service: Gastroenterology;  Laterality: N/A;  . EYE SURGERY  08/2012   laser surgery on retina/ DR Appenzeller  . FINGER SURGERY     repair of tendon in right index, Dr. Francesco Sor in [place]  . FOOT SURGERY     Dr. Milinda Pointer  . IRRIGATION AND DEBRIDEMENT SEBACEOUS CYST     right upper back  . LAPAROSCOPIC SUPRACERVICAL HYSTERECTOMY  06/2007   Dr Kincius/ adhesions/CPP/adenomyosis  . MASTECTOMY PARTIAL / LUMPECTOMY Right 01/2008   with sentinal lymph node biopsy/ infiltrating ductal cancer  . SKIN CANCER EXCISION     back and calves  . WISDOM TOOTH EXTRACTION  1991    There were no vitals filed for this visit.  Subjective Assessment - 10/18/18 1303    Subjective  L foot feels pretty good. 1/10 L lateral plantar area.     Pertinent History   Patient reports 07/19/2018 after having stepped hard on her left foot  not using the cam walker using her regular tennis shoes she had a sharp pain around the cuboid and radiating up her leg.  Radiographs show a  tear in the peroneus longus tendon.  She has tenderness on palpation of the cuboid  Currently in Pain?  Yes    Pain Score  1                                PT Education - 10/18/18 1339    Education Details  ther-ex    Person(s) Educated  Patient    Methods  Explanation;Demonstration;Tactile cues;Verbal cues    Comprehension  Verbalized understanding;Returned demonstration        Objectives   Therapeutic Exercise:  Slump: reproduction or L lateral plantar symptoms with DF and EV of L ankle  Decreased symptoms with either L knee flexion, or L hip ER.   Supine hip at 90/90 position  IR: 30 degrees L; 39 degrees R   Supine L hip IR stretch PT assist, 30 seconds x 5 (L leg over R LE, L foot lateral to R knee)    No L lateral plantar foot pain afterwards in supine  Still has symptoms with gait without  CAM boot  Long sit test suggests posterior nutation of L innominate  Supine SLR L hip flexion.  5x. Difficult. L anterior hip discomfort which eases with rest   Supine L hip march 3x. Difficult Seated L hip flexion 5x2  Less L lateral plantar symptoms with gait.   Standing L hip flexion march with R UE assist 10x2   Seated L ankle DF and IV 10x2 with 5 seconds (to help stretch L lateral gastroc muscle)  Seated L hip IR 10x3  Able to ambulate longer distance (about 45 ft) prior to onset of L lateral plantar foot symptoms.   Seated hip adduction pillow and glute max squeeze 10x10 seconds for 2 sets     Gait multiple times without CAM boot to assess effectiveness of treatment.  Pt demonstrates L foot pronation > R  Gait with L plantar arch slightly supported. Decreased L lateral plantar foot pain  Pt was recommended to use arch supports if able. Pt to make sure she is able to return product if it does not work. Pt verbalized understanding.     Improved exercise technique, movement at target joints, use of target muscles after min to mod verbal, visual, tactile cues.      Response to treatment  Good muscle use felt with exercises. Pt tolerated session well without aggravation of symptoms.   Clinical impression  Pt demonstrates L LE neural tension around the hip and knee area with change in L lateral plantar foot symptoms during slump test. Pt also demonstrates possible posterior nutation of L innominate in which pt demonstrates difficulty with L hip flexion compared to R hip flexion. Symptoms decreased with L hip flexion exercises. Pt also demonstrates L foot pronation > R during gait. Decreased L lateral plantar foot pain with gait with L arch supported as well. Pt will benefit from continued skilled physical therapy services to decrease pain, improve strength and function.       PT Short Term Goals - 10/16/18 1613      PT SHORT TERM GOAL #1   Title  Patient will  report a worst pain of 3/10 on VAS in left ankle to improve tolerance with ADLs and reduced symptoms with activities.     Baseline  2/10 with CAM boot on at worst for the past 7 days (10/16/2018)    Time  4    Period  Weeks    Status  Partially Met    Target Date  09/19/18      PT SHORT TERM GOAL #2   Title  Patient will increase BLE gross strength to 3+/5, no pain as to improve functional strength for independent gait, increased standing tolerance and increased ADL ability.    Time  4    Period  Weeks    Status  Achieved    Target Date  09/19/18        PT Long Term Goals - 10/16/18 1614      PT LONG TERM GOAL #1   Title  Patient will increase lower extremity functional scale to >60/80 to demonstrate improved functional mobility and increased tolerance with ADLs.     Baseline  56/80 (10/11/2018)    Time  4    Period  Weeks    Status  On-going    Target Date  11/09/18      PT LONG TERM GOAL #2   Title  Patient will report a worst pain of 1/10 on VAS in  left ankle  to improve tolerance with ADLs and reduced symptoms with activities.     Baseline  2/10 L ankle pain with CAM boot on at most for the past 7 days (10/16/2018)    Time  4    Period  Weeks    Status  On-going    Target Date  11/09/18      PT LONG TERM GOAL #3   Title  Patient will increase six minute walk test distance to >2000 for progression to community ambulator and improve gait ability    Baseline  1325 ft (10/09/2018)    Time  4    Period  Weeks    Status  On-going    Target Date  11/09/18            Plan - 10/18/18 1301    Clinical Impression Statement  Pt demonstrates L LE neural tension around the hip and knee area with change in L lateral plantar foot symptoms during slump test. Pt also demonstrates possible posterior nutation of L innominate in which pt demonstrates difficulty with L hip flexion compared to R hip flexion. Symptoms decreased with L hip flexion exercises. Pt also demonstrates L foot  pronation > R during gait. Decreased L lateral plantar foot pain with gait with L arch supported as well. Pt will benefit from continued skilled physical therapy services to decrease pain, improve strength and function.     Personal Factors and Comorbidities  Time since onset of injury/illness/exacerbation;Fitness    Examination-Activity Limitations  Squat;Locomotion Level;Stairs    Stability/Clinical Decision Making  Stable/Uncomplicated    Rehab Potential  Fair    PT Frequency  2x / week    PT Duration  4 weeks    PT Treatment/Interventions  Electrical Stimulation;Aquatic Therapy;Cryotherapy;Moist Heat;Ultrasound;Gait training;Therapeutic activities;Therapeutic exercise;Patient/family education;Neuromuscular re-education;Manual techniques;Passive range of motion;Dry needling;Splinting;Taping    PT Next Visit Plan  [place], e-stim, strengthening left ankle, manual techniques    Consulted and Agree with Plan of Care  Patient       Patient will benefit from skilled therapeutic intervention in order to improve the following deficits and impairments:  Abnormal gait, Decreased endurance, Decreased mobility, Difficulty walking, Obesity, Decreased activity tolerance, Decreased strength, Impaired flexibility  Visit Diagnosis: Muscle weakness (generalized)  Difficulty in walking, not elsewhere classified     Problem List Patient Active Problem List   Diagnosis Date Noted  . 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 OSA on CPAP 05/15/2018  .  Non-alcoholic fatty liver disease 03/06/2017  . Rosacea   . GERD (gastroesophageal reflux disease)   . Central serous retinopathy   . IBS (irritable bowel syndrome)   . Hyperlipidemia, mixed 01/24/2017  . Dyspnea on effort 12/22/2016  . Anxiety, generalized 04/29/2016  . Diabetes mellitus without complication ([place]) 35/46/5681  . Cancer of the skin, basal cell 03/12/2016  . Contact dermatitis 04/23/2013  . Nodule, subcutaneous 07/24/2012   . Localized swelling, mass and lump, unspecified 07/24/2012  . Hypertension 12/20/2011  . Personal history of breast cancer 12/20/2011  . Obesity 12/20/2011    Joneen Boers PT, DPT   10/18/2018, 2:12 PM  [facility] [address] [place], [address] Phone: [phone]   Fax:  [phone]  Name: Jacqueline Deleon MRN: [id number] Date of Birth: 10-Mar-1961

## 2018-10-19 ENCOUNTER — Encounter: Payer: 59 | Admitting: Physical Therapy

## 2018-10-19 MED FILL — LISINOPRIL 10 MG TABLET: 10 | 90 days supply | Qty: 90 | Fill #0

## 2018-10-19 MED FILL — AMLODIPINE BESYLATE 10 MG T: 10 | 90 days supply | Qty: 90 | Fill #0

## 2018-10-19 MED FILL — metFORMIN HCL ER 500 MG TB2: 500 | 90 days supply | Qty: 180 | Fill #0

## 2018-10-19 MED FILL — MINOCYCLINE HCL 100 MG CAPS: 100 | 90 days supply | Qty: 90 | Fill #0

## 2018-10-19 MED FILL — CITALOPRAM HBR 40 MG TABLET: 40 | 90 days supply | Qty: 90 | Fill #0

## 2018-10-24 ENCOUNTER — Other Ambulatory Visit: Payer: Self-pay

## 2018-10-24 ENCOUNTER — Ambulatory Visit: Payer: 59

## 2018-10-24 DIAGNOSIS — R262 Difficulty in walking, not elsewhere classified: Secondary | ICD-10-CM

## 2018-10-24 DIAGNOSIS — M6281 Muscle weakness (generalized): Secondary | ICD-10-CM | POA: Diagnosis not present

## 2018-10-24 NOTE — Therapy (Signed)
[facility] [address] [place], [address] Phone: [phone]   Fax:  [phone]  Physical Therapy Treatment  Patient Details  Name: Jacqueline Deleon MRN: [id number] Date of Birth: 04-Jun-1961 Referring Provider (PT): Derinda Late   Encounter Date: 10/24/2018  PT End of Session - 10/24/18 1502    Visit Number  15    Number of Visits  25    Date for PT Re-Evaluation  11/09/18    PT Start Time  1503    PT Stop Time  1554    PT Time Calculation (min)  51 min    Activity Tolerance  Patient tolerated treatment well;Patient limited by pain    Behavior During Therapy  [facility] for tasks assessed/performed       Past Medical History:  Diagnosis Date  . Arthritis   . 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 Breast mass 08/2006, 03/2009   left breast biopsy fibroadenoma (2008) right breast biopsy benign (2010)  . 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 Central serous retinopathy   . Fatty liver   . GERD (gastroesophageal reflux disease)   . Gestational diabetes   . Headache    migraines  . Heart murmur   . History of abnormal mammogram 08/2006, 01/2008, 03/2009  . Hypertension   . IBS (irritable bowel syndrome)   . Joint disorder    hx of left ankle tendonitis, bursitis, heel spur  . Obesity 2018   BMI 45  . Pelvic pain    adhesions and adenomyosis  . Rosacea     Past Surgical History:  Procedure Laterality Date  . BREAST BIOPSY Left 08/2006   benign fibroadenoma  . BREAST EXCISIONAL BIOPSY Right 02/26/2008   right breast invasive mam ca with rad partial mastectomy   . BREAST SURGERY Right    x2/ Dr Tamala Julian  . CARDIAC CATHETERIZATION  2006  . CESAREAN SECTION  04/02/1998  . CHOLECYSTECTOMY  04/2010   Dr. Smith/ laprascopic surgery  . COLONOSCOPY  04/04/2000  . ESOPHAGOGASTRODUODENOSCOPY (EGD) WITH PROPOFOL N/A 05/01/2015   Procedure:  ESOPHAGOGASTRODUODENOSCOPY (EGD) WITH PROPOFOL;  Surgeon: Hulen Luster, MD;  Location: [facility] ENDOSCOPY;  Service: Gastroenterology;  Laterality: N/A;  . EYE SURGERY  08/2012   laser surgery on retina/ DR Appenzeller  . FINGER SURGERY     repair of tendon in right index, Dr. Francesco Sor in [place]  . FOOT SURGERY     Dr. Milinda Pointer  . IRRIGATION AND DEBRIDEMENT SEBACEOUS CYST     right upper back  . LAPAROSCOPIC SUPRACERVICAL HYSTERECTOMY  06/2007   Dr Kincius/ adhesions/CPP/adenomyosis  . MASTECTOMY PARTIAL / LUMPECTOMY Right 01/2008   with sentinal lymph node biopsy/ infiltrating ductal cancer  . SKIN CANCER EXCISION     back and calves  . WISDOM TOOTH EXTRACTION  1991    There were no vitals filed for this visit.  Subjective Assessment - 10/24/18 1505    Subjective  L foot is pretty good. No pain at the moment.  Was ok after last session. The posterior lateral ankle has not bothered as much.     Pertinent History   Patient reports 07/19/2018 after having stepped hard on her left foot  not using the cam walker using her regular tennis shoes she had a sharp pain around the cuboid and radiating up her leg.  Radiographs show a  tear  in the peroneus longus tendon.  She has tenderness on palpation of the cuboid    Currently in Pain?  No/denies    Pain Score  0-No pain                               PT Education - 10/24/18 1508    Education Details  ther-ex    Person(s) Educated  Patient    Methods  Explanation;Demonstration;Tactile cues;Verbal cues    Comprehension  Returned demonstration;Verbalized understanding      Objectives  Standing posture: L lateral shift  Therapeutic Exercise:  Seated L LE neural flossing 10x3  Seated L hip flexion 5x6  Seated L hip IR 10x3 with 5 second holds  Seated R hip extension isometrics 10x3 with 5 second holds   Decreased L lateral plantar foot pain with gait without CAM boot. L posterior pelvic tilt observed during  exercise.   Slight increase in symptoms with with gait with shoe on   Standing R single leg deadlift with L UE assist 10x3 to promote R glute max strengthening   Decreased L lateral planar foot pain with gait with shoe on.   Gait with brace on: increased symptoms.   Standing L hip flexion 10x3 with R UE assist. Posterior tilt L pelvis observed  Standing B shoulder extension resisting green band 10x5 seconds for 2 sets   Anterior L ankle discomfort with weight bearing  Seated trunk flexion rolling a physioball 10x5 seconds x 3  Decreased L foot symptoms with gait without boot  Standing L lateral shift correction 10x5 seconds  Gait of 64 ft multiple times to assess effectiveness of treatment.    Add S/L hip abduction strengthening and continue with isometric/concentric/eccentric ankle strengthening next visit if appropriate    Improved exercise technique, movement at target joints, use of target muscles after min to mod verbal, visual, tactile cues.    Response to treatment  Good muscle use felt with exercises.Pt tolerated session well without aggravation of symptoms.   Clinical impression Decreased L lateral plantar foot pain with gait without CAM boot with exercises to promote R glute max muscle strength, as well as L posterior pelvic tilt. Increased symptoms with gait with shoe and brace on in which increased pressure to affected area may play a factor. L peroneal tendon symptoms at L lateral ankle area seem to be easing off based on subjective reports but swelling still visible. Pressure to L lateral plantar nerve due to increased pronation during L LE stance phase as well as possible lumbopelvic involvement affecting L LE neural tension may play a factor with her L lateral plantar foot symptoms. Pt will benefit from continued skilled physical therapy services to decrease pain, improve strength, function, and ability to ambulate more comfortably.      PT Short Term Goals  - 10/16/18 1613      PT SHORT TERM GOAL #1   Title  Patient will report a worst pain of 3/10 on VAS in left ankle to improve tolerance with ADLs and reduced symptoms with activities.     Baseline  2/10 with CAM boot on at worst for the past 7 days (10/16/2018)    Time  4    Period  Weeks    Status  Partially Met    Target Date  09/19/18      PT SHORT TERM GOAL #2   Title  Patient will increase BLE gross strength  to 3+/5, no pain as to improve functional strength for independent gait, increased standing tolerance and increased ADL ability.    Time  4    Period  Weeks    Status  Achieved    Target Date  09/19/18        PT Long Term Goals - 10/16/18 1614      PT LONG TERM GOAL #1   Title  Patient will increase lower extremity functional scale to >60/80 to demonstrate improved functional mobility and increased tolerance with ADLs.     Baseline  56/80 (10/11/2018)    Time  4    Period  Weeks    Status  On-going    Target Date  11/09/18      PT LONG TERM GOAL #2   Title  Patient will report a worst pain of 1/10 on VAS in  left ankle  to improve tolerance with ADLs and reduced symptoms with activities.     Baseline  2/10 L ankle pain with CAM boot on at most for the past 7 days (10/16/2018)    Time  4    Period  Weeks    Status  On-going    Target Date  11/09/18      PT LONG TERM GOAL #3   Title  Patient will increase six minute walk test distance to >2000 for progression to community ambulator and improve gait ability    Baseline  1325 ft (10/09/2018)    Time  4    Period  Weeks    Status  On-going    Target Date  11/09/18            Plan - 10/24/18 1509    Clinical Impression Statement  Decreased L lateral plantar foot pain with gait without CAM boot with exercises to promote R glute max muscle strength, as well as L posterior pelvic tilt. Increased symptoms with gait with shoe and brace on in which increased pressure to affected area may play a factor. L peroneal tendon  symptoms at L lateral ankle area seem to be easing off based on subjective reports but swelling still visible. Pressure to L lateral plantar nerve due to increased pronation during L LE stance phase as well as possible lumbopelvic involvement affecting L LE neural tension may play a factor with her L lateral plantar foot symptoms. Pt will benefit from continued skilled physical therapy services to decrease pain, improve strength, function, and ability to ambulate more comfortably.     Personal Factors and Comorbidities  Time since onset of injury/illness/exacerbation;Fitness    Examination-Activity Limitations  Squat;Locomotion Level;Stairs    Stability/Clinical Decision Making  Stable/Uncomplicated    Rehab Potential  Fair    PT Frequency  2x / week    PT Duration  4 weeks    PT Treatment/Interventions  Electrical Stimulation;Aquatic Therapy;Cryotherapy;Moist Heat;Ultrasound;Gait training;Therapeutic activities;Therapeutic exercise;Patient/family education;Neuromuscular re-education;Manual techniques;Passive range of motion;Dry needling;Splinting;Taping    PT Next Visit Plan  [place], e-stim, strengthening left ankle, manual techniques    Consulted and Agree with Plan of Care  Patient       Patient will benefit from skilled therapeutic intervention in order to improve the following deficits and impairments:  Abnormal gait, Decreased endurance, Decreased mobility, Difficulty walking, Obesity, Decreased activity tolerance, Decreased strength, Impaired flexibility  Visit Diagnosis: Muscle weakness (generalized)  Difficulty in walking, not elsewhere classified     Problem List Patient Active Problem List   Diagnosis Date Noted  . 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 OSA on CPAP 05/15/2018  . Non-alcoholic fatty liver disease 03/06/2017  . Rosacea   . GERD (gastroesophageal reflux disease)   . Central serous retinopathy   . IBS (irritable bowel syndrome)   .  Hyperlipidemia, mixed 01/24/2017  . Dyspnea on effort 12/22/2016  . Anxiety, generalized 04/29/2016  . Diabetes mellitus without complication ([place]) 00/86/7619  . Cancer of the skin, basal cell 03/12/2016  . Contact dermatitis 04/23/2013  . Nodule, subcutaneous 07/24/2012  . Localized swelling, mass and lump, unspecified 07/24/2012  . Hypertension 12/20/2011  . Personal history of breast cancer 12/20/2011  . Obesity 12/20/2011   Joneen Boers PT, DPT   10/25/2018, 8:47 AM  [facility] [address], [place], [address] Phone: [phone]   Fax:  [phone]  Name: Jacqueline Deleon MRN: [id number] Date of Birth: 1961-01-12

## 2018-10-26 ENCOUNTER — Other Ambulatory Visit: Payer: Self-pay

## 2018-10-26 ENCOUNTER — Ambulatory Visit: Payer: 59

## 2018-10-26 DIAGNOSIS — R262 Difficulty in walking, not elsewhere classified: Secondary | ICD-10-CM

## 2018-10-26 DIAGNOSIS — M6281 Muscle weakness (generalized): Secondary | ICD-10-CM

## 2018-10-26 NOTE — Therapy (Signed)
[facility] [address] [place], [address] Phone: [phone]   Fax:  [phone]  Physical Therapy Treatment  Patient Details  Name: FRANCILE WOOLFORD MRN: [id number] Date of Birth: 02-15-1961 Referring Provider (PT): Derinda Late   Encounter Date: 10/26/2018  PT End of Session - 10/26/18 1609    Visit Number  16    Number of Visits  25    Date for PT Re-Evaluation  11/09/18    PT Start Time  8270    PT Stop Time  1702    PT Time Calculation (min)  53 min    Activity Tolerance  Patient tolerated treatment well;Patient limited by pain    Behavior During Therapy  [facility] for tasks assessed/performed       Past Medical History:  Diagnosis Date  . Arthritis   . 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 Breast mass 08/2006, 03/2009   left breast biopsy fibroadenoma (2008) right breast biopsy benign (2010)  . 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 Central serous retinopathy   . Fatty liver   . GERD (gastroesophageal reflux disease)   . Gestational diabetes   . Headache    migraines  . Heart murmur   . History of abnormal mammogram 08/2006, 01/2008, 03/2009  . Hypertension   . IBS (irritable bowel syndrome)   . Joint disorder    hx of left ankle tendonitis, bursitis, heel spur  . Obesity 2018   BMI 45  . Pelvic pain    adhesions and adenomyosis  . Rosacea     Past Surgical History:  Procedure Laterality Date  . BREAST BIOPSY Left 08/2006   benign fibroadenoma  . BREAST EXCISIONAL BIOPSY Right 02/26/2008   right breast invasive mam ca with rad partial mastectomy   . BREAST SURGERY Right    x2/ Dr Tamala Julian  . CARDIAC CATHETERIZATION  2006  . CESAREAN SECTION  04/02/1998  . CHOLECYSTECTOMY  04/2010   Dr. Smith/ laprascopic surgery  . COLONOSCOPY  04/04/2000  . ESOPHAGOGASTRODUODENOSCOPY (EGD) WITH PROPOFOL N/A 05/01/2015   Procedure:  ESOPHAGOGASTRODUODENOSCOPY (EGD) WITH PROPOFOL;  Surgeon: Hulen Luster, MD;  Location: [facility] ENDOSCOPY;  Service: Gastroenterology;  Laterality: N/A;  . EYE SURGERY  08/2012   laser surgery on retina/ DR Appenzeller  . FINGER SURGERY     repair of tendon in right index, Dr. Francesco Sor in [place]  . FOOT SURGERY     Dr. Milinda Pointer  . IRRIGATION AND DEBRIDEMENT SEBACEOUS CYST     right upper back  . LAPAROSCOPIC SUPRACERVICAL HYSTERECTOMY  06/2007   Dr Kincius/ adhesions/CPP/adenomyosis  . MASTECTOMY PARTIAL / LUMPECTOMY Right 01/2008   with sentinal lymph node biopsy/ infiltrating ductal cancer  . SKIN CANCER EXCISION     back and calves  . WISDOM TOOTH EXTRACTION  1991    There were no vitals filed for this visit.  Subjective Assessment - 10/26/18 1611    Subjective  L foot is pretty good. Still has swelling L lateral ankle. Just has the pain in the bottom outside of her foot. Was ok after last session.  The L lateral ankle has not bothered her for about a week.     Pertinent History   Patient reports 07/19/2018 after having stepped hard on her left foot  not using the cam walker using her regular tennis shoes she had a sharp  pain around the cuboid and radiating up her leg.  Radiographs show a  tear in the peroneus longus tendon.  She has tenderness on palpation of the cuboid    Currently in Pain?  Yes    Pain Score  3                                PT Education - 10/26/18 1644    Education Details  ther-ex, HEP    Person(s) Educated  Patient    Methods  Explanation;Demonstration;Tactile cues;Verbal cues;Handout    Comprehension  Returned demonstration;Verbalized understanding        Objectives  Pt states that her blood pressure is controlled with medication  Slight decreased L lateral ankle swelling observed    Manual therapy:   Supine STM L lateral gastroc muscle to decrease tension   Supine effluerage L leg, and ankle to help decrease swelling    supine: STM L plantar fascia STM plantar lateral foot STM L heel fascial area   Therapeutic Exercise:  Supinemanually resistedstrengthening. Performed after manual therapy PF 10x3not isometrics (concentric and eccentric) EV 10x3 gentle concentric and eccentric.    No complain of pain. IV 10x3 concentric and eccentric DF 10x3 concentricand ecentriccontraction  Supine L hip IR using towel 30 seconds x 4  Reviewed and given as part of her HEP. Pt demonstrated and verbalized understanding. Handout provided.   Supine L knee extension with L thigh supported at 90 degrees hip flexion 10x3  Supine L hip march 10x3  Supine posterior pelvic tilt 10x5 seconds for 2 sets to promote trunk strengthening and decrease low back extension pressure   Gait without CAM boot on for 64 ft. No change in L lateral plantar symptoms.   Then gait 64 ft x 2 with 10 lbs on L side. Decreased pain   Add L glute med and max strengthening to next visit if appropriate.      Improved exercise technique, movement at target joints, use of target muscles after min to mod verbal, visual, tactile cues.       Response to treatment  Good muscle use felt with exercises. Pt tolerated session well without aggravation of symptoms.   Clinical impression Overall slight decrease in L lateral ankle swelling observed. Continued with manual therapy to decrease swelling as well as improve soft tissue mobility. Continued working on L ankle strengthening to promote proper stress to healing tendons. Decreased L lateral plantar foot symptoms with gait without shoe and CAM boot with pt holding a 10 lbs weight on her L side to counteract compensated trendelenberg pattern when on L LE stance phase. Pt tolerated session well without aggravation of symptoms. Pt will benefit from continued skilled physical therapy services to decrease pain and swelling as  well as to improve strength, function, and ability to ambulate more comfortably.         PT Short Term Goals - 10/16/18 1613      PT SHORT TERM GOAL #1   Title  Patient will report a worst pain of 3/10 on VAS in left ankle to improve tolerance with ADLs and reduced symptoms with activities.     Baseline  2/10 with CAM boot on at worst for the past 7 days (10/16/2018)    Time  4    Period  Weeks    Status  Partially Met    Target Date  09/19/18      PT  SHORT TERM GOAL #2   Title  Patient will increase BLE gross strength to 3+/5, no pain as to improve functional strength for independent gait, increased standing tolerance and increased ADL ability.    Time  4    Period  Weeks    Status  Achieved    Target Date  09/19/18        PT Long Term Goals - 10/16/18 1614      PT LONG TERM GOAL #1   Title  Patient will increase lower extremity functional scale to >60/80 to demonstrate improved functional mobility and increased tolerance with ADLs.     Baseline  56/80 (10/11/2018)    Time  4    Period  Weeks    Status  On-going    Target Date  11/09/18      PT LONG TERM GOAL #2   Title  Patient will report a worst pain of 1/10 on VAS in  left ankle  to improve tolerance with ADLs and reduced symptoms with activities.     Baseline  2/10 L ankle pain with CAM boot on at most for the past 7 days (10/16/2018)    Time  4    Period  Weeks    Status  On-going    Target Date  11/09/18      PT LONG TERM GOAL #3   Title  Patient will increase six minute walk test distance to >2000 for progression to community ambulator and improve gait ability    Baseline  1325 ft (10/09/2018)    Time  4    Period  Weeks    Status  On-going    Target Date  11/09/18            Plan - 10/26/18 1645    Clinical Impression Statement  Overall slight decrease in L lateral ankle swelling observed. Continued with manual therapy to decrease swelling as well as improve soft tissue mobility. Continued working on L  ankle strengthening to promote proper stress to healing tendons. Decreased L lateral plantar foot symptoms with gait without shoe and CAM boot with pt holding a 10 lbs weight on her L side to counteract compensated trendelenberg pattern when on L LE stance phase. Pt tolerated session well without aggravation of symptoms. Pt will benefit from continued skilled physical therapy services to decrease pain and swelling as well as to improve strength, function, and ability to ambulate more comfortably.     Personal Factors and Comorbidities  Time since onset of injury/illness/exacerbation;Fitness    Examination-Activity Limitations  Squat;Locomotion Level;Stairs    Stability/Clinical Decision Making  Stable/Uncomplicated    Rehab Potential  Fair    PT Frequency  2x / week    PT Duration  4 weeks    PT Treatment/Interventions  Electrical Stimulation;Aquatic Therapy;Cryotherapy;Moist Heat;Ultrasound;Gait training;Therapeutic activities;Therapeutic exercise;Patient/family education;Neuromuscular re-education;Manual techniques;Passive range of motion;Dry needling;Splinting;Taping    PT Next Visit Plan  [place], e-stim, strengthening left ankle, manual techniques    Consulted and Agree with Plan of Care  Patient       Patient will benefit from skilled therapeutic intervention in order to improve the following deficits and impairments:  Abnormal gait, Decreased endurance, Decreased mobility, Difficulty walking, Obesity, Decreased activity tolerance, Decreased strength, Impaired flexibility  Visit Diagnosis: Muscle weakness (generalized)  Difficulty in walking, not elsewhere classified     Problem List Patient Active Problem List   Diagnosis Date Noted  . Cirrhosis of liver not due to alcohol ([place]) 08/29/2018  .  OSA on CPAP 05/15/2018  . Non-alcoholic fatty liver disease 03/06/2017  . Rosacea   . GERD (gastroesophageal reflux disease)   . Central serous retinopathy   . IBS (irritable bowel syndrome)    . Hyperlipidemia, mixed 01/24/2017  . Dyspnea on effort 12/22/2016  . Anxiety, generalized 04/29/2016  . Diabetes mellitus without complication ([place]) 81/85/9093  . Cancer of the skin, basal cell 03/12/2016  . Contact dermatitis 04/23/2013  . Nodule, subcutaneous 07/24/2012  . Localized swelling, mass and lump, unspecified 07/24/2012  . Hypertension 12/20/2011  . Personal history of breast cancer 12/20/2011  . Obesity 12/20/2011   Joneen Boers PT, DPT   10/26/2018, 5:15 PM  [facility] [address] [place], [address] Phone: [phone]   Fax:  [phone]  Name: SIERA BEYERSDORF MRN: [id number] Date of Birth: Mar 04, 1961

## 2018-10-26 NOTE — Patient Instructions (Signed)
Supine L hip IR using towel 30 seconds x 5  Reviewed and given as part of her HEP. Pt demonstrated and verbalized understanding. Handout provided.

## 2018-10-31 ENCOUNTER — Ambulatory Visit: Payer: 59

## 2018-11-02 ENCOUNTER — Other Ambulatory Visit: Payer: Self-pay

## 2018-11-02 ENCOUNTER — Ambulatory Visit: Payer: 59

## 2018-11-02 DIAGNOSIS — M6281 Muscle weakness (generalized): Secondary | ICD-10-CM

## 2018-11-02 DIAGNOSIS — M9903 Segmental and somatic dysfunction of lumbar region: Secondary | ICD-10-CM | POA: Diagnosis not present

## 2018-11-02 DIAGNOSIS — M9901 Segmental and somatic dysfunction of cervical region: Secondary | ICD-10-CM | POA: Diagnosis not present

## 2018-11-02 DIAGNOSIS — M5412 Radiculopathy, cervical region: Secondary | ICD-10-CM | POA: Diagnosis not present

## 2018-11-02 DIAGNOSIS — R262 Difficulty in walking, not elsewhere classified: Secondary | ICD-10-CM

## 2018-11-02 DIAGNOSIS — M9902 Segmental and somatic dysfunction of thoracic region: Secondary | ICD-10-CM | POA: Diagnosis not present

## 2018-11-02 DIAGNOSIS — M545 Low back pain: Secondary | ICD-10-CM | POA: Diagnosis not present

## 2018-11-02 DIAGNOSIS — R51 Headache: Secondary | ICD-10-CM | POA: Diagnosis not present

## 2018-11-02 DIAGNOSIS — M9904 Segmental and somatic dysfunction of sacral region: Secondary | ICD-10-CM | POA: Diagnosis not present

## 2018-11-02 DIAGNOSIS — M6080 Other myositis, unspecified site: Secondary | ICD-10-CM | POA: Diagnosis not present

## 2018-11-02 DIAGNOSIS — M542 Cervicalgia: Secondary | ICD-10-CM | POA: Diagnosis not present

## 2018-11-02 NOTE — Patient Instructions (Signed)
  Sitting on a chair    Press your hands on your thighs to feel your abdominal muscles contract.   Hold for 5 seconds comfortably.   Repeat 10 times.   Perform 3 sets twice daily.

## 2018-11-02 NOTE — Therapy (Signed)
[facility] [address] [place], [address] Phone: [phone]   Fax:  [phone]  Physical Therapy Treatment  Patient Details  Name: Jacqueline Deleon MRN: [id number] Date of Birth: February 23, 1961 Referring Provider (PT): Derinda Late   Encounter Date: 11/02/2018  PT End of Session - 11/02/18 1501    Visit Number  17    Number of Visits  25    Date for PT Re-Evaluation  11/09/18    PT Start Time  1502    PT Stop Time  1556    PT Time Calculation (min)  54 min    Activity Tolerance  Patient tolerated treatment well;Patient limited by pain    Behavior During Therapy  [facility] for tasks assessed/performed       Past Medical History:  Diagnosis Date  . Arthritis   . 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 Breast mass 08/2006, 03/2009   left breast biopsy fibroadenoma (2008) right breast biopsy benign (2010)  . 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 Central serous retinopathy   . Fatty liver   . GERD (gastroesophageal reflux disease)   . Gestational diabetes   . Headache    migraines  . Heart murmur   . History of abnormal mammogram 08/2006, 01/2008, 03/2009  . Hypertension   . IBS (irritable bowel syndrome)   . Joint disorder    hx of left ankle tendonitis, bursitis, heel spur  . Obesity 2018   BMI 45  . Pelvic pain    adhesions and adenomyosis  . Rosacea     Past Surgical History:  Procedure Laterality Date  . BREAST BIOPSY Left 08/2006   benign fibroadenoma  . BREAST EXCISIONAL BIOPSY Right 02/26/2008   right breast invasive mam ca with rad partial mastectomy   . BREAST SURGERY Right    x2/ Dr Tamala Julian  . CARDIAC CATHETERIZATION  2006  . CESAREAN SECTION  04/02/1998  . CHOLECYSTECTOMY  04/2010   Dr. Smith/ laprascopic surgery  . COLONOSCOPY  04/04/2000  . ESOPHAGOGASTRODUODENOSCOPY (EGD) WITH PROPOFOL N/A 05/01/2015   Procedure:  ESOPHAGOGASTRODUODENOSCOPY (EGD) WITH PROPOFOL;  Surgeon: Hulen Luster, MD;  Location: [facility] ENDOSCOPY;  Service: Gastroenterology;  Laterality: N/A;  . EYE SURGERY  08/2012   laser surgery on retina/ DR Appenzeller  . FINGER SURGERY     repair of tendon in right index, Dr. Francesco Sor in [place]  . FOOT SURGERY     Dr. Milinda Pointer  . IRRIGATION AND DEBRIDEMENT SEBACEOUS CYST     right upper back  . LAPAROSCOPIC SUPRACERVICAL HYSTERECTOMY  06/2007   Dr Kincius/ adhesions/CPP/adenomyosis  . MASTECTOMY PARTIAL / LUMPECTOMY Right 01/2008   with sentinal lymph node biopsy/ infiltrating ductal cancer  . SKIN CANCER EXCISION     back and calves  . WISDOM TOOTH EXTRACTION  1991    There were no vitals filed for this visit.  Subjective Assessment - 11/02/18 1503    Subjective  Feeling better from food poisoning. Last time she had diarrhea was yesterday but pt has IBS and no gall bladder. L foot is fine.  Did not wear her boot earlier today and her foot did fine. The bottom of her foot did not really bother her. Also states being sedendary this morning.  Has not gotten arch supports because she is not in a shoe yet.  Also sees a chiropractor for maintenance  for back, neck pain and headaches.     Pertinent History   Patient reports 07/19/2018 after having stepped hard on her left foot  not using the cam walker using her regular tennis shoes she had a sharp pain around the cuboid and radiating up her leg.  Radiographs show a  tear in the peroneus longus tendon.  She has tenderness on palpation of the cuboid    Currently in Pain?  No/denies    Pain Score  0-No pain                               PT Education - 11/02/18 1506    Education Details  ther-ex, HEP    Person(s) Educated  Patient    Methods  Explanation;Demonstration;Tactile cues;Verbal cues;Handout    Comprehension  Returned demonstration;Verbalized understanding      Objectives  Pt states that her blood pressure is  controlled with medication  L lateral shift posture during gait  Manual therapy:   Prone STM L lateral gastroc muscle to decrease tension    Therapeutic Exercise:  Gait with shoe on, no CAM boot.   64 ft, 1/10 L lateral plantar foot  Then gait with arch supports in shoe, no CAM boot  64 ft, 1/10 but pt states foot feels slightly better   Prone manually resisted L ankle DF concentric and eccentric contraction 10x3   Then gait with arch supports in shoe, no CAM boot  64 ft, 1/10 foot symptoms  Prone glute max extension   L 10x3  R 10x. Less difficult compared to L  S/L L hip abduction 10x2, then 5  Seated L hip extension isometrics 10x5 seconds   Then with pillow under L thigh to take pressure off knee and foot 10x5 seconds for 2 sets  Seated trunk flexion isometrics, manually resisted by PT 10x3 with 5 second holds   Decreased L foot pain with gait 64 ft to 0.5/10, shoe on with arch supports  Prone on elbows 2x 1 minute   0.5/10 L foot pain with 64 ft gait  Seated manually resisted R lateral shift isometrics in neutral to counter L lateral shift posture with gait  10x3 with 5 second holds     Decreased L lateral plantar foot pain with gait 64 ft without CAM boot but with arch supports to less than 0.5/10.   Improved exercise technique, movement at target joints, use of target muscles after min to mod verbal, visual, tactile cues.      Response to treatment  Good muscle use felt with exercises.Pt tolerated session well without aggravation of symptoms.   Clinical impression  Decreased L lateral plantar foot pain with gait without CAM boot on with arch supported as well as after trunk, glute and tibialis anterior strengthening and decreasing tension to gastroc muscle. Decreased L foot symptoms with gait as well with trunk strengthening to decrease L lateral shift posture. Pt will benefit from continued skilled physical therapy services to decrease  foot pain, improve LE strength, function, and decrease difficulty ambulating.       PT Short Term Goals - 10/16/18 1613      PT SHORT TERM GOAL #1   Title  Patient will report a worst pain of 3/10 on VAS in left ankle to improve tolerance with ADLs and reduced symptoms with activities.     Baseline  2/10 with CAM boot on at worst for the past 7  days (10/16/2018)    Time  4    Period  Weeks    Status  Partially Met    Target Date  09/19/18      PT SHORT TERM GOAL #2   Title  Patient will increase BLE gross strength to 3+/5, no pain as to improve functional strength for independent gait, increased standing tolerance and increased ADL ability.    Time  4    Period  Weeks    Status  Achieved    Target Date  09/19/18        PT Long Term Goals - 10/16/18 1614      PT LONG TERM GOAL #1   Title  Patient will increase lower extremity functional scale to >60/80 to demonstrate improved functional mobility and increased tolerance with ADLs.     Baseline  56/80 (10/11/2018)    Time  4    Period  Weeks    Status  On-going    Target Date  11/09/18      PT LONG TERM GOAL #2   Title  Patient will report a worst pain of 1/10 on VAS in  left ankle  to improve tolerance with ADLs and reduced symptoms with activities.     Baseline  2/10 L ankle pain with CAM boot on at most for the past 7 days (10/16/2018)    Time  4    Period  Weeks    Status  On-going    Target Date  11/09/18      PT LONG TERM GOAL #3   Title  Patient will increase six minute walk test distance to >2000 for progression to community ambulator and improve gait ability    Baseline  1325 ft (10/09/2018)    Time  4    Period  Weeks    Status  On-going    Target Date  11/09/18            Plan - 11/02/18 1507    Clinical Impression Statement  Decreased L lateral plantar foot pain with gait without CAM boot on with arch supported as well as after trunk, glute and tibialis anterior strengthening and decreasing tension to  gastroc muscle. Decreased L foot symptoms with gait as well with trunk strengthening to decrease L lateral shift posture. Pt will benefit from continued skilled physical therapy services to decrease foot pain, improve LE strength, function, and decrease difficulty ambulating.     Personal Factors and Comorbidities  Time since onset of injury/illness/exacerbation;Fitness    Examination-Activity Limitations  Squat;Locomotion Level;Stairs    Stability/Clinical Decision Making  Stable/Uncomplicated    Rehab Potential  Fair    PT Frequency  2x / week    PT Duration  4 weeks    PT Treatment/Interventions  Electrical Stimulation;Aquatic Therapy;Cryotherapy;Moist Heat;Ultrasound;Gait training;Therapeutic activities;Therapeutic exercise;Patient/family education;Neuromuscular re-education;Manual techniques;Passive range of motion;Dry needling;Splinting;Taping    PT Next Visit Plan  [place], e-stim, strengthening left ankle, manual techniques    Consulted and Agree with Plan of Care  Patient       Patient will benefit from skilled therapeutic intervention in order to improve the following deficits and impairments:  Abnormal gait, Decreased endurance, Decreased mobility, Difficulty walking, Obesity, Decreased activity tolerance, Decreased strength, Impaired flexibility  Visit Diagnosis: Muscle weakness (generalized)  Difficulty in walking, not elsewhere classified     Problem List Patient Active Problem List   Diagnosis Date Noted  . 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 OSA on CPAP  05/15/2018  . Non-alcoholic fatty liver disease 03/06/2017  . Rosacea   . GERD (gastroesophageal reflux disease)   . Central serous retinopathy   . IBS (irritable bowel syndrome)   . Hyperlipidemia, mixed 01/24/2017  . Dyspnea on effort 12/22/2016  . Anxiety, generalized 04/29/2016  . Diabetes mellitus without complication ([place]) 54/30/1484  . Cancer of the skin, basal cell 03/12/2016  . Contact  dermatitis 04/23/2013  . Nodule, subcutaneous 07/24/2012  . Localized swelling, mass and lump, unspecified 07/24/2012  . Hypertension 12/20/2011  . Personal history of breast cancer 12/20/2011  . Obesity 12/20/2011    Joneen Boers PT, DPT   11/02/2018, 6:21 PM  [facility] [address] [place], [address] Phone: [phone]   Fax:  [phone]  Name: Jacqueline Deleon MRN: [id number] Date of Birth: [birth year]

## 2018-11-09 ENCOUNTER — Other Ambulatory Visit: Payer: Self-pay | Admitting: Gastroenterology

## 2018-11-09 DIAGNOSIS — K7460 Unspecified cirrhosis of liver: Secondary | ICD-10-CM

## 2018-11-11 MED FILL — buPROPion HCL ER (XL) 150 M: 150 | 90 days supply | Qty: 90 | Fill #0

## 2018-11-11 MED FILL — OMEPRAZOLE 20 MG CPDR: 20 | 90 days supply | Qty: 180 | Fill #0

## 2018-11-14 ENCOUNTER — Ambulatory Visit: Payer: 59

## 2018-11-16 ENCOUNTER — Ambulatory Visit: Payer: 59

## 2018-11-21 ENCOUNTER — Ambulatory Visit: Payer: 59 | Attending: Podiatry | Admitting: Physical Therapy

## 2018-11-21 ENCOUNTER — Encounter: Payer: Self-pay | Admitting: Physical Therapy

## 2018-11-21 ENCOUNTER — Other Ambulatory Visit: Payer: Self-pay

## 2018-11-21 DIAGNOSIS — R262 Difficulty in walking, not elsewhere classified: Secondary | ICD-10-CM | POA: Insufficient documentation

## 2018-11-21 DIAGNOSIS — M6281 Muscle weakness (generalized): Secondary | ICD-10-CM | POA: Diagnosis not present

## 2018-11-21 NOTE — Therapy (Signed)
[facility] [address] [place], [address] Phone: [phone]   Fax:  [phone]  Physical Therapy Treatment / Progress Note / Re-Certification Reporting period: 10/16/2018 - 11/21/2018  Patient Details  Name: Jacqueline Deleon MRN: [id number] Date of Birth: Nov 16, 1960 Referring Provider (PT): [place], [place] T, [place]   Encounter Date: 11/21/2018  PT End of Session - 11/21/18 1511    Visit Number  18    Number of Visits  25    Date for PT Re-Evaluation  01/02/19    Authorization Type  [employer] [facility] [employer] reporting period from 11/21/2018    Authorization - Visit Number  1    Authorization - Number of Visits  10    PT Start Time  1505    PT Stop Time  1550    PT Time Calculation (min)  45 min    Activity Tolerance  Patient tolerated treatment well;Patient limited by pain    Behavior During Therapy  [facility] for tasks assessed/performed       Past Medical History:  Diagnosis Date  . Arthritis   . 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 Breast mass 08/2006, 03/2009   left breast biopsy fibroadenoma (2008) right breast biopsy benign (2010)  . 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 Central serous retinopathy   . Fatty liver   . GERD (gastroesophageal reflux disease)   . Gestational diabetes   . Headache    migraines  . Heart murmur   . History of abnormal mammogram 08/2006, 01/2008, 03/2009  . Hypertension   . IBS (irritable bowel syndrome)   . Joint disorder    hx of left ankle tendonitis, bursitis, heel spur  . Obesity 2018   BMI 45  . Pelvic pain    adhesions and adenomyosis  . Rosacea     Past Surgical History:  Procedure Laterality Date  . BREAST BIOPSY Left 08/2006   benign fibroadenoma  . BREAST EXCISIONAL BIOPSY Right 02/26/2008   right breast invasive mam ca with rad partial mastectomy   . BREAST SURGERY Right    x2/ Dr Tamala Julian  . CARDIAC  CATHETERIZATION  2006  . CESAREAN SECTION  04/02/1998  . CHOLECYSTECTOMY  04/2010   Dr. Smith/ laprascopic surgery  . COLONOSCOPY  04/04/2000  . ESOPHAGOGASTRODUODENOSCOPY (EGD) WITH PROPOFOL N/A 05/01/2015   Procedure: ESOPHAGOGASTRODUODENOSCOPY (EGD) WITH PROPOFOL;  Surgeon: Hulen Luster, MD;  Location[facility] END[facility]COPY;  Service: Gastroenterology;  Laterality: N/A;  . EYE SURGERY  08/2012   laser surgery on retina/ DR Appenzeller  . FINGER SURGERY     repair of tendon in right index, Dr. Francesco Sor in [place]  . FOOT SURGERY     Dr. Milinda Pointer  . IRRIGATION AND DEBRIDEMENT SEBACEOUS CYST     right upper back  . LAPAROSCOPIC SUPRACERVICAL HYSTERECTOMY  06/2007   Dr Kincius/ adhesions/CPP/adenomyosis  . MASTECTOMY PARTIAL / LUMPECTOMY Right 01/2008   with sentinal lymph node biopsy/ infiltrating ductal cancer  . SKIN CANCER EXCISION     back and calves  . WISDOM TOOTH EXTRACTION  1991    There were no vitals filed for this visit.  Subjective Assessment - 11/21/18 1511    Subjective  Patient reports that things are gradually improving. She has been pretty slack lately with her HEP and she can tell the ankle is not happy with her. She has been  having difficulty motivating herself to perform the exercises. She has been taking care of two family members sick with [place] while quaranteened at home and was just cleared to return to work and come back to PT. This is why she has not been to PT since 11/02/2018. She is unsure if/when she can get the boot off. She states it is feeling a bit worse today after she tried some Classical Stretching standing up out of the boot today. She continues to feel a "knot" on the plantar surface of the left foot that is about 3/10 currently (highest 3/10; lowest 0/10). It mostly bothers her when she is on it.  Patient is unsure how long physician would like her to continue wearing the boot or when it is okay to start weaning it. She reports she has started ambulating in  the home without it one    Pertinent History   Patient reports 07/19/2018 after having stepped hard on her left foot  not using the cam walker using her regular tennis shoes she had a sharp pain around the cuboid and radiating up her leg.  Radiographs show a  tear in the peroneus longus tendon.  She has tenderness on palpation of the cuboid    How long can you walk comfortably?  5-10 mins standing and waking (with boot donned)    Patient Stated Goals  to walk and stand without pain.     Currently in Pain?  Yes    Pain Score  3     Pain Location  Foot    Pain Orientation  Left;Lateral    Pain Descriptors / Indicators  Aching    Pain Type  Chronic pain    Pain Radiating Towards  no    Pain Onset  More than a month ago    Aggravating Factors   walking/weight bearing    Pain Relieving Factors  rest    Effect of Pain on Daily Activities  unable to be as active; work restrictions              [facility] PT Assessment - 11/21/18 0001      Assessment   Medical Diagnosis  Closed nondisplaced fracture of cuboid of left foot with routine healing, subsequent encounter; peroneal longus tendon tear left    Referring Provider (PT)  [place], Max T, DPM    Onset Date/Surgical Date  05/04/19    Hand Dominance  Right      Precautions   Precautions  --   wear cam boot when not sleeping   Required Braces or Orthoses  --   cam boot     Restrictions   Weight Bearing Restrictions  No      Balance Screen   Has the patient fallen in the past 6 months  No      Prior Function   Level of Independence  Independent;Independent with household mobility without device;Independent with community mobility without device;Independent with homemaking with ambulation    Vocation  Part time employment   usually FT but hours cut due to [employer]  usually on feet most of day; currently working with restrictions at desk job   minimize weight bearing, 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   Leisure   TV, crafts, read      Cognition   Overall Cognitive Status  Within Functional Limits for tasks assessed      Observation/Other Assessments   Lower Extremity Functional Scale   46/80  measured on 11/21/2018     Strength   Overall Strength Comments  ankle strength measured with manual resistance    Right Hip Flexion  4-/5    Right Hip Extension  4-/5    Right Hip ABduction  4-/5    Left Hip Flexion  4/5    Left Hip Extension  4-/5    Left Hip ABduction  4/5    Right Knee Flexion  5/5    Right Knee Extension  5/5    Left Knee Flexion  5/5    Left Knee Extension  5/5    Right Ankle Dorsiflexion  5/5   manual resistance   Right Ankle Plantar Flexion  5/5   manual resistance   Right Ankle Inversion  5/5    Right Ankle Eversion  5/5    Left Ankle Dorsiflexion  5/5   manual resistance   Left Ankle Plantar Flexion  5/5   manual resistance; lateral plantar pain   Left Ankle Inversion  4+/5    Left Ankle Eversion  4/5   painful; pronation: painful 3/5     6 minute walk test results    Aerobic Endurance Distance Walked  1300   tennis shoes (no cam boot) pain up to 4/10       TREATMENT:   Pt states that her blood pressure is controlled with medication  Mild L lateral shift posture during gait   Therapeutic exercise: to centralize symptoms and improve ROM, strength, muscular endurance, and activity tolerance required for successful completion of functional activities.  - 6MWT to assess progress (see above).  - MMT of ankles to assess progress (see above). - discussion of HEP with provision of green theraband.   - Seated left ankle BAPS board, level 3, plantarflexion/dorsiflexion x10, inversion/eversion x20, circles clockwise 20, circles counter clockwise x20. Pt required cuing to keep knee still and instruction on how to perform exercise. To improve coordination, control, proprioception, and ankle strength.  Standing wearing socks only; SBA as needed; touchdown UE support as  needed; to improve proprioception, ankle/foot strength/endurance, activity/standing tolerance.  - standing tandem stance on firm surface x 30 seconds, R foot front - SLS with touchdown UE support as needed 2x30 sec each side - Narrow stance on compliant surface (airex) eyes open x 30 seconds - Narrow stance on compliant surface (airex) eyes closed 30 seconds - Self-selected stance forward/backward sways within COG on compliant surface (airex) x 60 seconds.    Patient response to treatment:  Pt tolerated treatment well overall but did report some increase in pain up to 4/10 during ambulation and WB activities without wearing the boot. Her pain was back down to 2/10 during ambulation after re-donning the boot. Patient stated she felt the session was productive and expected to have some discomfort while healing. She appeared willing and comfortable performing all exercises and assessments selected today. Pt required multimodal cuing for proper technique and to facilitate improved neuromuscular control, strength, range of motion, and functional ability resulting in improved performance and form.     PT Education - 11/21/18 1735    Education Details  Exercise purpose/form. Self management techniques. Education on diagnosis, prognosis, POC, anatomy and physiology of current condition. Education on HEP including green theraband    Person(s) Educated  Patient    Methods  Explanation;Demonstration;Tactile cues;Verbal cues    Comprehension  Verbalized understanding;Returned demonstration;Tactile cues required       PT Short Term Goals - 11/21/18 1518      PT  SHORT TERM GOAL #1   Title  Patient will report a worst pain of 3/10 on VAS in left ankle to improve tolerance with ADLs and reduced symptoms with activities.     Baseline  2/10 with CAM boot on at worst for the past 7 days (10/16/2018)    Time  4    Period  Weeks    Status  Partially Met    Target Date  09/19/18      PT SHORT TERM GOAL #2    Title  Patient will increase BLE gross strength to 3+/5, no pain as to improve functional strength for independent gait, increased standing tolerance and increased ADL ability.    Time  4    Period  Weeks    Status  Achieved    Target Date  09/19/18        PT Long Term Goals - 11/21/18 1538      PT LONG TERM GOAL #1   Title  Patient will increase lower extremity functional scale to >60/80 to demonstrate improved functional mobility and increased tolerance with ADLs.     Baseline  56/80 (10/11/2018)    Time  4    Period  Weeks    Status  On-going    Target Date  01/02/19      PT LONG TERM GOAL #2   Title  Patient will report a worst pain of 1/10 on VAS in  left ankle  to improve tolerance with ADLs and reduced symptoms with activities.     Baseline  2/10 L ankle pain with CAM boot on at most for the past 7 days (10/16/2018); 4/10 during 6MWT without boot on (11/21/18);     Time  6    Period  Weeks    Status  On-going    Target Date  01/02/19      PT LONG TERM GOAL #3   Title  Patient will increase six minute walk test distance to >2000 for progression to community ambulator and improve gait ability    Baseline  1325 ft (10/09/2018): 1300 ft (11/21/2018);     Time  6    Period  Weeks    Status  On-going    Target Date  01/02/19      PT LONG TERM GOAL #4   Title  Complete community, work and/or recreational activities without limitation due to current condition.     Baseline  has not yet come out of cam boot, returned to full duty work (11/21/2018);     Time  6    Period  Weeks    Status  New    Target Date  01/02/19            Plan - 11/21/18 1758    Clinical Impression Statement  Patient has attended 18 physical therapy treatment sessions this episode of care and has made gradual progress towards goals. Over the last 2.5 weeks she was unable to attend sessions because she was quarantined at home caring for family members sick with COVID-19 and is now returning due to being  cleared to return to work and PT. During this time she had difficulty motivating herself to perform her HEP and has noted her ankle has worsened slightly in response. Today her symptoms were somewhat elevated due to performing some TV guided exercise/stretching yesterday after a period of not doing much, which explains why she would be a bit sore. She was able to complete the 6 Minute Walk Test today  without her cam boot on with similar time to last measurement. She continues to gain strength in the ankle and is limited by pain in eversion, plantar flexion, and most in pronation/PF, she is able to balance for 30 seconds SLS on the affected leg with some discomfort and evidence of increased imbalance compared the the left. Patient has not yet been able to return to normal ambulation or activities without the cam boot and continues to be on light duty at work. Patient would benefit from continued physical therapy to address remaining impairments and functional limitations to work towards stated goals and return to PLOF or maximal functional independence.     Personal Factors and Comorbidities  Time since onset of injury/illness/exacerbation;Fitness    Examination-Activity Limitations  Squat;Locomotion Level;Stairs    Stability/Clinical Decision Making  Stable/Uncomplicated    Rehab Potential  Fair    PT Frequency  2x / week    PT Duration  6 weeks    PT Treatment/Interventions  Electrical Stimulation;Aquatic Therapy;Cryotherapy;Moist Heat;Ultrasound;Gait training;Therapeutic activities;Therapeutic exercise;Patient/family education;Neuromuscular re-education;Manual techniques;Passive range of motion;Dry needling;Splinting;Taping    PT Next Visit Plan  [place], e-stim, strengthening left ankle, manual techniques    PT Home Exercise Plan  consult referring physician about when to wean out of cam boot/ update precautions.     Consulted and Agree with Plan of Care  Patient       Patient will benefit from skilled  therapeutic intervention in order to improve the following deficits and impairments:  Abnormal gait, Decreased endurance, Decreased mobility, Difficulty walking, Obesity, Decreased activity tolerance, Decreased strength, Impaired flexibility  Visit Diagnosis: Muscle weakness (generalized)  Difficulty in walking, not elsewhere classified     Problem List Patient Active Problem List   Diagnosis Date Noted  . 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 OSA on CPAP 05/15/2018  . Non-alcoholic fatty liver disease 03/06/2017  . Rosacea   . GERD (gastroesophageal reflux disease)   . Central serous retinopathy   . IBS (irritable bowel syndrome)   . Hyperlipidemia, mixed 01/24/2017  . Dyspnea on effort 12/22/2016  . Anxiety, generalized 04/29/2016  . Diabetes mellitus without complication ([place]) 35/36/1443  . Cancer of the skin, basal cell 03/12/2016  . Contact dermatitis 04/23/2013  . Nodule, subcutaneous 07/24/2012  . Localized swelling, mass and lump, unspecified 07/24/2012  . Hypertension 12/20/2011  . Personal history of breast cancer 12/20/2011  . Obesity 12/20/2011   Everlean Alstrom. Graylon Good, PT, DPT 11/21/18, 6:03 PM  [facility] [address] [place], [address] Phone: [phone]   Fax:  [phone]  Name: Jacqueline Deleon MRN: [id number] Date of Birth: [birth year]

## 2018-11-22 ENCOUNTER — Telehealth: Payer: Self-pay

## 2018-11-22 NOTE — Telephone Encounter (Signed)
Called Dr. Stephenie Acres office, talked to his nurse Caryl Pina who said that pt can progress out of the boot. Can use an ankle brace if needed.

## 2018-11-23 ENCOUNTER — Ambulatory Visit: Payer: 59

## 2018-11-23 ENCOUNTER — Other Ambulatory Visit: Payer: Self-pay

## 2018-11-23 DIAGNOSIS — R262 Difficulty in walking, not elsewhere classified: Secondary | ICD-10-CM

## 2018-11-23 DIAGNOSIS — M6281 Muscle weakness (generalized): Secondary | ICD-10-CM | POA: Diagnosis not present

## 2018-11-23 NOTE — Therapy (Signed)
[facility] [address] [place], [address] Phone: [phone]   Fax:  [phone]  Physical Therapy Treatment  Patient Details  Name: Jacqueline Deleon MRN: [id number] Date of Birth: 1960-09-13 Referring Provider (PT): [place], [place] T, [place]   Encounter Date: 11/23/2018  PT End of Session - 11/23/18 1503    Visit Number  19    Number of Visits  25    Date for PT Re-Evaluation  01/02/19    Authorization Type  [employer] reporting period from 11/21/2018    Authorization - Visit Number  2    Authorization - Number of Visits  10    PT Start Time  1504    PT Stop Time  1552    PT Time Calculation (min)  48 min    Activity Tolerance  Patient tolerated treatment well;Patient limited by pain    Behavior During Therapy  [facility] for tasks assessed/performed       Past Medical History:  Diagnosis Date  . Arthritis   . 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 Breast mass 08/2006, 03/2009   left breast biopsy fibroadenoma (2008) right breast biopsy benign (2010)  . 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 Central serous retinopathy   . Fatty liver   . GERD (gastroesophageal reflux disease)   . Gestational diabetes   . Headache    migraines  . Heart murmur   . History of abnormal mammogram 08/2006, 01/2008, 03/2009  . Hypertension   . IBS (irritable bowel syndrome)   . Joint disorder    hx of left ankle tendonitis, bursitis, heel spur  . Obesity 2018   BMI 45  . Pelvic pain    adhesions and adenomyosis  . Rosacea     Past Surgical History:  Procedure Laterality Date  . BREAST BIOPSY Left 08/2006   benign fibroadenoma  . BREAST EXCISIONAL BIOPSY Right 02/26/2008   right breast invasive mam ca with rad partial mastectomy   . BREAST SURGERY Right    x2/ Dr Tamala Julian  . CARDIAC CATHETERIZATION  2006  . CESAREAN SECTION  04/02/1998  . CHOLECYSTECTOMY  04/2010   Dr. Smith/ laprascopic surgery  . COLONOSCOPY  04/04/2000  . ESOPHAGOGASTRODUODENOSCOPY (EGD) WITH PROPOFOL N/A 05/01/2015   Procedure: ESOPHAGOGASTRODUODENOSCOPY (EGD) WITH PROPOFOL;  Surgeon: Hulen Luster, MD;  Location: [facility] ENDOSCOPY;  Service: Gastroenterology;  Laterality: N/A;  . EYE SURGERY  08/2012   laser surgery on retina/ DR Appenzeller  . FINGER SURGERY     repair of tendon in right index, Dr. Francesco Sor in [place]  . FOOT SURGERY     Dr. Milinda Pointer  . IRRIGATION AND DEBRIDEMENT SEBACEOUS CYST     right upper back  . LAPAROSCOPIC SUPRACERVICAL HYSTERECTOMY  06/2007   Dr Kincius/ adhesions/CPP/adenomyosis  . MASTECTOMY PARTIAL / LUMPECTOMY Right 01/2008   with sentinal lymph node biopsy/ infiltrating ductal cancer  . SKIN CANCER EXCISION     back and calves  . WISDOM TOOTH EXTRACTION  1991    There were no vitals filed for this visit.  Subjective Assessment - 11/23/18 1505    Subjective  L foot is a little painful today, probably from not exercising and was on it barefooted. The L lateral ankle is fine. Still has the pain in the lateral plantar foot.  The decreased pain from the 11/02/2018 session carried over  to the next day.  Pt states that she did not really have any symptoms when her husband and son was sick. Just a funny feeling in her throat the Tuesday after he got sick. Takes half a claritin as well as multivitamins.     Pertinent History   Patient reports 07/19/2018 after having stepped hard on her left foot  not using the cam walker using her regular tennis shoes she had a sharp pain around the cuboid and radiating up her leg.  Radiographs show a  tear in the peroneus longus tendon.  She has tenderness on palpation of the cuboid    How long can you walk comfortably?  5-10 mins standing and waking (with boot donned)    Patient Stated Goals  to walk and stand without pain.     Currently in Pain?  Yes    Pain Score  3    when getting into car to go to PT   Pain Onset  More  than a month ago                               PT Education - 11/23/18 1546    Education Details  ther-ex    Person(s) Educated  Patient    Methods  Explanation;Demonstration;Tactile cues;Verbal cues    Comprehension  Verbalized understanding;Returned demonstration      Objectives  Pt states that her blood pressure is controlled with medication  L lateral shift posture during gait  Limited L ankle DF observed during gait without CAM boot    Medbridge Access Code: [email or web address]      Manual therapy:    Prone STM L lateral gastroc muscle to decrease tension  Prone STM L lateral plantar foot  Therapeutic Exercise:   Prone manually resisted L ankle DF concentric and eccentric contraction 10x3  Prone manually resisted L ankle IV 10x3 with 5 second holds  Seated manually resisted R lateral shift isometrics in neutral to counter L lateral shift posture with gait             10x3 with 5 second holds   Seated trunk flexion isometrics, manually resisted by PT 10x3 with 5 second holds  Gait 64 ft x 4 after treatment with shoe and arch support, no CAM boot   2/10 initially but eased after second 64 ft walk  Pt states L foot feels better when she places weight onto lateral foot.    Then gait with glute squeeze 64 ft. Increased symptoms    Add resisted ankle IV as part of HEP next visit if appropriate    Improved exercise technique, movement at target joints, use of target muscles after min to mod verbal, visual, tactile cues.      Response to treatment  Good muscle use felt with exercises.Pt tolerated session well without aggravation of symptoms.Decreased L foot pain with gait with shoe and arch support to 1/10 after session    Clinical impression  Continued working on decreasing gastroc and plantar foot muscle tension, improving ankle, glute and trunk strength, as well as improving trunk posture. Decreased L foot pain with  gait without CAM boot on but with shoe and arch support to 1/10 after session. Pt was recommended to wean off the CAM boot but to wear her shoe with arch support secondary to clearance from MD's nurse. Pt will benefit from continued skilled physical therapy services to decrease pain, improve L ankle strength,  stability, and ability to ambulate and perform standing tasks more comfortably.      PT Short Term Goals - 11/21/18 1518      PT SHORT TERM GOAL #1   Title  Patient will report a worst pain of 3/10 on VAS in left ankle to improve tolerance with ADLs and reduced symptoms with activities.     Baseline  2/10 with CAM boot on at worst for the past 7 days (10/16/2018)    Time  4    Period  Weeks    Status  Partially Met    Target Date  09/19/18      PT SHORT TERM GOAL #2   Title  Patient will increase BLE gross strength to 3+/5, no pain as to improve functional strength for independent gait, increased standing tolerance and increased ADL ability.    Time  4    Period  Weeks    Status  Achieved    Target Date  09/19/18        PT Long Term Goals - 11/21/18 1538      PT LONG TERM GOAL #1   Title  Patient will increase lower extremity functional scale to >60/80 to demonstrate improved functional mobility and increased tolerance with ADLs.     Baseline  56/80 (10/11/2018)    Time  4    Period  Weeks    Status  On-going    Target Date  01/02/19      PT LONG TERM GOAL #2   Title  Patient will report a worst pain of 1/10 on VAS in  left ankle  to improve tolerance with ADLs and reduced symptoms with activities.     Baseline  2/10 L ankle pain with CAM boot on at most for the past 7 days (10/16/2018); 4/10 during 6MWT without boot on (11/21/18);     Time  6    Period  Weeks    Status  On-going    Target Date  01/02/19      PT LONG TERM GOAL #3   Title  Patient will increase six minute walk test distance to >2000 for progression to community ambulator and improve gait ability    Baseline   1325 ft (10/09/2018): 1300 ft (11/21/2018);     Time  6    Period  Weeks    Status  On-going    Target Date  01/02/19      PT LONG TERM GOAL #4   Title  Complete community, work and/or recreational activities without limitation due to current condition.     Baseline  has not yet come out of cam boot, returned to full duty work (11/21/2018);     Time  6    Period  Weeks    Status  New    Target Date  01/02/19            Plan - 11/23/18 1503    Clinical Impression Statement  Continued working on decreasing gastroc and plantar foot muscle tension, improving ankle, glute and trunk strength, as well as improving trunk posture. Decreased L foot pain with gait without CAM boot on but with shoe and arch support to 1/10 after session. Pt was recommended to wean off the CAM boot but to wear her shoe with arch support secondary to clearance from MD's nurse. Pt will benefit from continued skilled physical therapy services to decrease pain, improve L ankle strength, stability, and ability to ambulate and perform standing tasks more comfortably.  Personal Factors and Comorbidities  Time since onset of injury/illness/exacerbation;Fitness    Examination-Activity Limitations  Squat;Locomotion Level;Stairs    Stability/Clinical Decision Making  Stable/Uncomplicated    Rehab Potential  Fair    PT Frequency  2x / week    PT Duration  6 weeks    PT Treatment/Interventions  Electrical Stimulation;Aquatic Therapy;Cryotherapy;Moist Heat;Ultrasound;Gait training;Therapeutic activities;Therapeutic exercise;Patient/family education;Neuromuscular re-education;Manual techniques;Passive range of motion;Dry needling;Splinting;Taping    PT Next Visit Plan  [place], e-stim, strengthening left ankle, manual techniques    PT Home Exercise Plan  consult referring physician about when to wean out of cam boot/ update precautions.     Consulted and Agree with Plan of Care  Patient       Patient will benefit from skilled  therapeutic intervention in order to improve the following deficits and impairments:  Abnormal gait, Decreased endurance, Decreased mobility, Difficulty walking, Obesity, Decreased activity tolerance, Decreased strength, Impaired flexibility  Visit Diagnosis: Muscle weakness (generalized)  Difficulty in walking, not elsewhere classified     Problem List Patient Active Problem List   Diagnosis Date Noted  . 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 OSA on CPAP 05/15/2018  . Non-alcoholic fatty liver disease 03/06/2017  . Rosacea   . GERD (gastroesophageal reflux disease)   . Central serous retinopathy   . IBS (irritable bowel syndrome)   . Hyperlipidemia, mixed 01/24/2017  . Dyspnea on effort 12/22/2016  . Anxiety, generalized 04/29/2016  . Diabetes mellitus without complication ([place]) 01/05/9484  . Cancer of the skin, basal cell 03/12/2016  . Contact dermatitis 04/23/2013  . Nodule, subcutaneous 07/24/2012  . Localized swelling, mass and lump, unspecified 07/24/2012  . Hypertension 12/20/2011  . Personal history of breast cancer 12/20/2011  . Obesity 12/20/2011   Joneen Boers PT, DPT   11/23/2018, 5:18 PM  [facility] [address] [place], [address] Phone: [phone]   Fax:  [phone]  Name: LYNNETT LANGLINAIS MRN: [id number] Date of Birth: 11-16-1960

## 2018-11-23 NOTE — Patient Instructions (Signed)
  Medbridge Access Code: [email or web address]   Sidelying Hip Abduction  10x3 L LE

## 2018-11-27 ENCOUNTER — Ambulatory Visit: Admit: 2018-11-27 | Payer: 59 | Admitting: Gastroenterology

## 2018-11-27 SURGERY — COLONOSCOPY WITH PROPOFOL
Anesthesia: General

## 2018-11-28 ENCOUNTER — Other Ambulatory Visit: Payer: Self-pay

## 2018-11-28 ENCOUNTER — Ambulatory Visit: Payer: 59

## 2018-11-28 DIAGNOSIS — M6281 Muscle weakness (generalized): Secondary | ICD-10-CM | POA: Diagnosis not present

## 2018-11-28 DIAGNOSIS — R262 Difficulty in walking, not elsewhere classified: Secondary | ICD-10-CM

## 2018-11-28 NOTE — Therapy (Signed)
[facility] [address], [place], [address] Phone: [phone]   Fax:  [phone]  Physical Therapy Treatment  Patient Details  Name: Jacqueline Deleon MRN: [id number] Date of Birth: [birth year] Referring Provider (PT): [place], [place] T, [place]   Encounter Date: 11/28/2018  PT End of Session - 11/28/18 1503    Visit Number  20    Number of Visits  25    Date for PT Re-Evaluation  01/02/19    Authorization Type  [employer] reporting period from 11/21/2018    Authorization - Visit Number  3    Authorization - Number of Visits  10    PT Start Time  1503    PT Stop Time  1555    PT Time Calculation (min)  52 min    Activity Tolerance  Patient tolerated treatment well;Patient limited by pain    Behavior During Therapy  [facility] for tasks assessed/performed       Past Medical History:  Diagnosis Date  . Arthritis   . 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 Breast mass 08/2006, 03/2009   left breast biopsy fibroadenoma (2008) right breast biopsy benign (2010)  . 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 Central serous retinopathy   . Fatty liver   . GERD (gastroesophageal reflux disease)   . Gestational diabetes   . Headache    migraines  . Heart murmur   . History of abnormal mammogram 08/2006, 01/2008, 03/2009  . Hypertension   . IBS (irritable bowel syndrome)   . Joint disorder    hx of left ankle tendonitis, bursitis, heel spur  . Obesity 2018   BMI 45  . Pelvic pain    adhesions and adenomyosis  . Rosacea     Past Surgical History:  Procedure Laterality Date  . BREAST BIOPSY Left 08/2006   benign fibroadenoma  . BREAST EXCISIONAL BIOPSY Right 02/26/2008   right breast invasive mam ca with rad partial mastectomy   . BREAST SURGERY Right    x2/ Dr Tamala Julian  . CARDIAC CATHETERIZATION  2006  . CESAREAN SECTION  04/02/1998  . CHOLECYSTECTOMY  04/2010   Dr. Smith/ laprascopic surgery  . COLONOSCOPY  04/04/2000  . ESOPHAGOGASTRODUODENOSCOPY (EGD) WITH PROPOFOL N/A 05/01/2015   Procedure: ESOPHAGOGASTRODUODENOSCOPY (EGD) WITH PROPOFOL;  Surgeon: Hulen Luster, MD;  Location: [facility] ENDOSCOPY;  Service: Gastroenterology;  Laterality: N/A;  . EYE SURGERY  08/2012   laser surgery on retina/ DR Appenzeller  . FINGER SURGERY     repair of tendon in right index, Dr. Francesco Sor in [place]  . FOOT SURGERY     Dr. Milinda Pointer  . IRRIGATION AND DEBRIDEMENT SEBACEOUS CYST     right upper back  . LAPAROSCOPIC SUPRACERVICAL HYSTERECTOMY  06/2007   Dr Kincius/ adhesions/CPP/adenomyosis  . MASTECTOMY PARTIAL / LUMPECTOMY Right 01/2008   with sentinal lymph node biopsy/ infiltrating ductal cancer  . SKIN CANCER EXCISION     back and calves  . WISDOM TOOTH EXTRACTION  1991    There were no vitals filed for this visit.  Subjective Assessment - 11/28/18 1504    Subjective  L foot is pretty good. 1/10 with gait with shoe on. Pt starts the day out with her shoe. Changes to boot at lunch time. Has a sitting job.     Pertinent History   Patient reports 07/19/2018 after  having stepped hard on her left foot  not using the cam walker using her regular tennis shoes she had a sharp pain around the cuboid and radiating up her leg.  Radiographs show a  tear in the peroneus longus tendon.  She has tenderness on palpation of the cuboid    How long can you walk comfortably?  5-10 mins standing and waking (with boot donned)    Patient Stated Goals  to walk and stand without pain.     Currently in Pain?  Yes    Pain Score  1     Pain Onset  More than a month ago                               PT Education - 11/28/18 1504    Education Details  ther-ex    Person(s) Educated  Patient    Methods  Explanation;Demonstration;Tactile cues;Verbal cues    Comprehension  Returned demonstration;Verbalized understanding      Objectives  Pt states that her  blood pressure is controlled with medication  L lateral shift posture during gait  Limited L ankle DF observed during gait without CAM boot    Medbridge Access Code: [email or web address]      Manual therapy:   Prone STM L lateral gastroc muscle to decrease tension Prone STM L lateral plantar foot  Therapeutic Exercise:   Prone manually resisted L ankle DF concentric and eccentric contraction 10x3  Prone manually resisted L ankle IV 10x3 with 5 second holds  Prone L glute max extension 10x, then 10x5 seconds, then 5x5 seconds  Seated L foot plantar flexion isometrics. Discomfort  Seated L toe curls 10x5 seconds for 2 sets  Seated manually resisted R lateral shift isometrics in neutral to counter L lateral shift posture with gait 10x2 with 10 second holds   Seated trunk flexion isometrics, manually resisted by PT 10x 10 seconds  Standing ankle DF/PF rockerboard 2 minutes with B UE assist 1:40 min. Increased plantar symptoms.   Seated L hip ER 10x5 seconds   Decreased L lateral plantar foot and plantar foot symptoms with gait  L anterior ankle discomfort   Limited L ankle DF ROM observed with gait  Seated L heel slide to promote ankle DF ROM 10x5 seconds   Slight increase L anterior ankle discomfort with gait.    Improved exercise technique, movement at target joints, use of target muscles after min to mod verbal, visual, tactile cues.    Response to treatment  Good muscle use felt with exercises. Slight increase in symptoms after manual therapy and closed chain ankle exercises today. Decreased L lateral plantar symptoms with seated L hip ER to help decrease L hip IR and adduction during gait.    Clinical impression Continued working on decreasing muscle tension to L gastroc and plantar foot area as well as improving plantar muscle and gastroc strengthening. Slight increased in discomfort today afterwards. Decreased L lateral plantar foot  and plantar foot pain with after seated L hip ER exercise. Pt able to wear her sneakers for the first part of today without the CAM boot with 1/10 starting pain level at today's session. Pt will benefit from continued skilled physical therapy services to decrease pain, improve strength, L LE weight bearing, function, and decrease difficulty with gait.       PT Short Term Goals - 11/21/18 1518      PT SHORT TERM GOAL #1  Title  Patient will report a worst pain of 3/10 on VAS in left ankle to improve tolerance with ADLs and reduced symptoms with activities.     Baseline  2/10 with CAM boot on at worst for the past 7 days (10/16/2018)    Time  4    Period  Weeks    Status  Partially Met    Target Date  09/19/18      PT SHORT TERM GOAL #2   Title  Patient will increase BLE gross strength to 3+/5, no pain as to improve functional strength for independent gait, increased standing tolerance and increased ADL ability.    Time  4    Period  Weeks    Status  Achieved    Target Date  09/19/18        PT Long Term Goals - 11/21/18 1538      PT LONG TERM GOAL #1   Title  Patient will increase lower extremity functional scale to >60/80 to demonstrate improved functional mobility and increased tolerance with ADLs.     Baseline  56/80 (10/11/2018)    Time  4    Period  Weeks    Status  On-going    Target Date  01/02/19      PT LONG TERM GOAL #2   Title  Patient will report a worst pain of 1/10 on VAS in  left ankle  to improve tolerance with ADLs and reduced symptoms with activities.     Baseline  2/10 L ankle pain with CAM boot on at most for the past 7 days (10/16/2018); 4/10 during 6MWT without boot on (11/21/18);     Time  6    Period  Weeks    Status  On-going    Target Date  01/02/19      PT LONG TERM GOAL #3   Title  Patient will increase six minute walk test distance to >2000 for progression to community ambulator and improve gait ability    Baseline  1325 ft (10/09/2018): 1300 ft  (11/21/2018);     Time  6    Period  Weeks    Status  On-going    Target Date  01/02/19      PT LONG TERM GOAL #4   Title  Complete community, work and/or recreational activities without limitation due to current condition.     Baseline  has not yet come out of cam boot, returned to full duty work (11/21/2018);     Time  6    Period  Weeks    Status  New    Target Date  01/02/19            Plan - 11/28/18 1717    Clinical Impression Statement  Continued working on decreasing muscle tension to L gastroc and plantar foot area as well as improving plantar muscle and gastroc strengthening. Slight increased in discomfort today afterwards. Decreased L lateral plantar foot and plantar foot pain with after seated L hip ER exercise. Pt able to wear her sneakers for the first part of today without the CAM boot with 1/10 starting pain level at today's session. Pt will benefit from continued skilled physical therapy services to decrease pain, improve strength, L LE weight bearing, function, and decrease difficulty with gait.     Personal Factors and Comorbidities  Time since onset of injury/illness/exacerbation;Fitness    Examination-Activity Limitations  Squat;Locomotion Level;Stairs    Stability/Clinical Decision Making  Stable/Uncomplicated    Rehab Potential  Fair    PT Frequency  2x / week    PT Duration  6 weeks    PT Treatment/Interventions  Electrical Stimulation;Aquatic Therapy;Cryotherapy;Moist Heat;Ultrasound;Gait training;Therapeutic activities;Therapeutic exercise;Patient/family education;Neuromuscular re-education;Manual techniques;Passive range of motion;Dry needling;Splinting;Taping    PT Next Visit Plan  [place], e-stim, strengthening left ankle, manual techniques    PT Home Exercise Plan  consult referring physician about when to wean out of cam boot/ update precautions.     Consulted and Agree with Plan of Care  Patient       Patient will benefit from skilled therapeutic  intervention in order to improve the following deficits and impairments:  Abnormal gait, Decreased endurance, Decreased mobility, Difficulty walking, Obesity, Decreased activity tolerance, Decreased strength, Impaired flexibility  Visit Diagnosis: Muscle weakness (generalized)  Difficulty in walking, not elsewhere classified     Problem List Patient Active Problem List   Diagnosis Date Noted  . 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 OSA on CPAP 05/15/2018  . Non-alcoholic fatty liver disease 03/06/2017  . Rosacea   . GERD (gastroesophageal reflux disease)   . Central serous retinopathy   . IBS (irritable bowel syndrome)   . Hyperlipidemia, mixed 01/24/2017  . Dyspnea on effort 12/22/2016  . Anxiety, generalized 04/29/2016  . Diabetes mellitus without complication ([place]) 34/14/4360  . Cancer of the skin, basal cell 03/12/2016  . Contact dermatitis 04/23/2013  . Nodule, subcutaneous 07/24/2012  . Localized swelling, mass and lump, unspecified 07/24/2012  . Hypertension 12/20/2011  . Personal history of breast cancer 12/20/2011  . Obesity 12/20/2011    Joneen Boers PT, DPT   11/28/2018, 5:18 PM  [facility] [address] [place], [address] Phone: [phone]   Fax:  [phone]  Name: IOANNA COLQUHOUN MRN: [id number] Date of Birth: May 13, 1961

## 2018-11-28 NOTE — Patient Instructions (Signed)
Seated L toe curls 10x5 seconds for 3 sets. Reviewed and given as part of her HEP. Pt demonstrated and verbalized understanding.

## 2018-11-30 ENCOUNTER — Ambulatory Visit: Payer: 59

## 2018-11-30 ENCOUNTER — Other Ambulatory Visit: Payer: Self-pay

## 2018-11-30 DIAGNOSIS — M9903 Segmental and somatic dysfunction of lumbar region: Secondary | ICD-10-CM | POA: Diagnosis not present

## 2018-11-30 DIAGNOSIS — M9901 Segmental and somatic dysfunction of cervical region: Secondary | ICD-10-CM | POA: Diagnosis not present

## 2018-11-30 DIAGNOSIS — R262 Difficulty in walking, not elsewhere classified: Secondary | ICD-10-CM | POA: Diagnosis not present

## 2018-11-30 DIAGNOSIS — M9904 Segmental and somatic dysfunction of sacral region: Secondary | ICD-10-CM | POA: Diagnosis not present

## 2018-11-30 DIAGNOSIS — M9902 Segmental and somatic dysfunction of thoracic region: Secondary | ICD-10-CM | POA: Diagnosis not present

## 2018-11-30 DIAGNOSIS — M6281 Muscle weakness (generalized): Secondary | ICD-10-CM

## 2018-11-30 DIAGNOSIS — M5412 Radiculopathy, cervical region: Secondary | ICD-10-CM | POA: Diagnosis not present

## 2018-11-30 DIAGNOSIS — M542 Cervicalgia: Secondary | ICD-10-CM | POA: Diagnosis not present

## 2018-11-30 DIAGNOSIS — M6080 Other myositis, unspecified site: Secondary | ICD-10-CM | POA: Diagnosis not present

## 2018-11-30 DIAGNOSIS — R51 Headache: Secondary | ICD-10-CM | POA: Diagnosis not present

## 2018-11-30 DIAGNOSIS — M545 Low back pain: Secondary | ICD-10-CM | POA: Diagnosis not present

## 2018-11-30 NOTE — Therapy (Signed)
[facility] [address] [place], [address] Phone: [phone]   Fax:  [phone]  Physical Therapy Treatment  Patient Details  Name: Jacqueline Deleon MRN: [id number] Date of Birth: 1960/12/16 Referring Provider (PT): [place], [place] T, [place]   Encounter Date: 11/30/2018  PT End of Session - 11/30/18 1500    Visit Number  21    Number of Visits  25    Date for PT Re-Evaluation  01/02/19    Authorization Type  [employer] reporting period from 11/21/2018    Authorization - Visit Number  4    Authorization - Number of Visits  10    PT Start Time  1501    PT Stop Time  1544    PT Time Calculation (min)  43 min    Activity Tolerance  Patient tolerated treatment well;Patient limited by pain    Behavior During Therapy  [facility] for tasks assessed/performed       Past Medical History:  Diagnosis Date  . Arthritis   . 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 Breast mass 08/2006, 03/2009   left breast biopsy fibroadenoma (2008) right breast biopsy benign (2010)  . 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 Central serous retinopathy   . Fatty liver   . GERD (gastroesophageal reflux disease)   . Gestational diabetes   . Headache    migraines  . Heart murmur   . History of abnormal mammogram 08/2006, 01/2008, 03/2009  . Hypertension   . IBS (irritable bowel syndrome)   . Joint disorder    hx of left ankle tendonitis, bursitis, heel spur  . Obesity 2018   BMI 45  . Pelvic pain    adhesions and adenomyosis  . Rosacea     Past Surgical History:  Procedure Laterality Date  . BREAST BIOPSY Left 08/2006   benign fibroadenoma  . BREAST EXCISIONAL BIOPSY Right 02/26/2008   right breast invasive mam ca with rad partial mastectomy   . BREAST SURGERY Right    x2/ Dr Tamala Julian  . CARDIAC CATHETERIZATION  2006  . CESAREAN SECTION  04/02/1998  . CHOLECYSTECTOMY  04/2010    Dr. Smith/ laprascopic surgery  . COLONOSCOPY  04/04/2000  . ESOPHAGOGASTRODUODENOSCOPY (EGD) WITH PROPOFOL N/A 05/01/2015   Procedure: ESOPHAGOGASTRODUODENOSCOPY (EGD) WITH PROPOFOL;  Surgeon: Hulen Luster, MD;  Location: [facility] ENDOSCOPY;  Service: Gastroenterology;  Laterality: N/A;  . EYE SURGERY  08/2012   laser surgery on retina/ DR Appenzeller  . FINGER SURGERY     repair of tendon in right index, Dr. Francesco Sor in [place]  . FOOT SURGERY     Dr. Milinda Pointer  . IRRIGATION AND DEBRIDEMENT SEBACEOUS CYST     right upper back  . LAPAROSCOPIC SUPRACERVICAL HYSTERECTOMY  06/2007   Dr Kincius/ adhesions/CPP/adenomyosis  . MASTECTOMY PARTIAL / LUMPECTOMY Right 01/2008   with sentinal lymph node biopsy/ infiltrating ductal cancer  . SKIN CANCER EXCISION     back and calves  . WISDOM TOOTH EXTRACTION  1991    There were no vitals filed for this visit.  Subjective Assessment - 11/30/18 1502    Subjective  L foot is good. A lot better than yesterday. Less than 1/10 pain currently L lateral plantar surface. It hurt yesterday but stayed on her tennis shoe all morning.  Has not really been on her foot all day and  has not used her CAM boot today.  Going back to work full time next week.  L hip is feeling better. Does not hurt as much getting in and out of the car or putting on her L sock on.      Pertinent History   Patient reports 07/19/2018 after having stepped hard on her left foot  not using the cam walker using her regular tennis shoes she had a sharp pain around the cuboid and radiating up her leg.  Radiographs show a  tear in the peroneus longus tendon.  She has tenderness on palpation of the cuboid    How long can you walk comfortably?  5-10 mins standing and waking (with boot donned)    Patient Stated Goals  to walk and stand without pain.     Currently in Pain?  Yes    Pain Score  1    Less than 1/10   Pain Onset  More than a month ago                                PT Education - 11/30/18 1506    Education Details  ther-ex    Person(s) Educated  Patient    Methods  Explanation;Demonstration;Tactile cues;Verbal cues    Comprehension  Returned demonstration;Verbalized understanding        Objectives  Pt states that her blood pressure is controlled with medication  L lateral shift posture during gait  Limited L ankle DF observed during gait without CAM boot    MedbridgeAccess Code: [email or web address]  (10/18/2018) Supine hip at 90/90 position             IR: 30 degrees L; 39 degrees R  (11/30/2018): 32 degrees L   Manual therapy:     Prone STM L lateral gastroc muscle to decrease tension Prone STM L lateral plantar foot  Therapeutic Exercise:   Prone manually resisted L ankle DF concentric and eccentric contraction 10x3  Prone manually resisted L ankle IV 10x3 with 2 second holds  prone L toe curls 10x 2 seconds for 2 sets  Prone L glute max extension 10x5 seconds for 3 sets  Seated L ankle PF/DF on rocker board 2 min  Seated trunk flexion isometrics, manually resisted by PT 10x 10 seconds for 2 sets    Gait x 64 ft after session without boot but with sneaker and insert. No pain.    Improved exercise technique, movement at target joints, use of target muscles after min to mod verbal, visual, tactile cues.      Response to treatment  Good muscle use felt with exercises. No pain with gait after session.    Clinical impression Decreased seated L lateral shift posture so did not work on exercise to correct. Continued working on improving trunk muscle activation, L ankle DF and IV as well as L hip extension strengthening, decreasing gastroc and plantar muscle tension, as well as promoting L ankle movement. Pt able to ambulate without CAM boot but with sneaker and arch support fot 64 ft after session without pain. Pt making good progress with PT towards  goals. Pt will benefit from continued skilled physical therapy services to decrease pain, improve strength, function, and decrease difficulty ambulating.       PT Short Term Goals - 11/21/18 1518      PT SHORT TERM GOAL #1   Title  Patient will report a worst pain of  3/10 on VAS in left ankle to improve tolerance with ADLs and reduced symptoms with activities.     Baseline  2/10 with CAM boot on at worst for the past 7 days (10/16/2018)    Time  4    Period  Weeks    Status  Partially Met    Target Date  09/19/18      PT SHORT TERM GOAL #2   Title  Patient will increase BLE gross strength to 3+/5, no pain as to improve functional strength for independent gait, increased standing tolerance and increased ADL ability.    Time  4    Period  Weeks    Status  Achieved    Target Date  09/19/18        PT Long Term Goals - 11/21/18 1538      PT LONG TERM GOAL #1   Title  Patient will increase lower extremity functional scale to >60/80 to demonstrate improved functional mobility and increased tolerance with ADLs.     Baseline  56/80 (10/11/2018)    Time  4    Period  Weeks    Status  On-going    Target Date  01/02/19      PT LONG TERM GOAL #2   Title  Patient will report a worst pain of 1/10 on VAS in  left ankle  to improve tolerance with ADLs and reduced symptoms with activities.     Baseline  2/10 L ankle pain with CAM boot on at most for the past 7 days (10/16/2018); 4/10 during 6MWT without boot on (11/21/18);     Time  6    Period  Weeks    Status  On-going    Target Date  01/02/19      PT LONG TERM GOAL #3   Title  Patient will increase six minute walk test distance to >2000 for progression to community ambulator and improve gait ability    Baseline  1325 ft (10/09/2018): 1300 ft (11/21/2018);     Time  6    Period  Weeks    Status  On-going    Target Date  01/02/19      PT LONG TERM GOAL #4   Title  Complete community, work and/or recreational activities without limitation  due to current condition.     Baseline  has not yet come out of cam boot, returned to full duty work (11/21/2018);     Time  6    Period  Weeks    Status  New    Target Date  01/02/19            Plan - 11/30/18 1456    Clinical Impression Statement  Decreased seated L lateral shift posture so did not work on exercise to correct. Continued working on improving trunk muscle activation, L ankle DF and IV as well as L hip extension strengthening, decreasing gastroc and plantar muscle tension, as well as promoting L ankle movement. Pt able to ambulate without CAM boot but with sneaker and arch support fot 64 ft after session without pain. Pt making good progress with PT towards goals. Pt will benefit from continued skilled physical therapy services to decrease pain, improve strength, function, and decrease difficulty ambulating.     Personal Factors and Comorbidities  Time since onset of injury/illness/exacerbation;Fitness    Examination-Activity Limitations  Squat;Locomotion Level;Stairs    Stability/Clinical Decision Making  Stable/Uncomplicated    Rehab Potential  Fair    PT Frequency  2x / week    PT Duration  6 weeks    PT Treatment/Interventions  Electrical Stimulation;Aquatic Therapy;Cryotherapy;Moist Heat;Ultrasound;Gait training;Therapeutic activities;Therapeutic exercise;Patient/family education;Neuromuscular re-education;Manual techniques;Passive range of motion;Dry needling;Splinting;Taping    PT Next Visit Plan  [place], e-stim, strengthening left ankle, manual techniques    PT Home Exercise Plan  consult referring physician about when to wean out of cam boot/ update precautions.     Consulted and Agree with Plan of Care  Patient       Patient will benefit from skilled therapeutic intervention in order to improve the following deficits and impairments:  Abnormal gait, Decreased endurance, Decreased mobility, Difficulty walking, Obesity, Decreased activity tolerance, Decreased strength,  Impaired flexibility  Visit Diagnosis: Muscle weakness (generalized)  Difficulty in walking, not elsewhere classified     Problem List Patient Active Problem List   Diagnosis Date Noted  . 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 OSA on CPAP 05/15/2018  . Non-alcoholic fatty liver disease 03/06/2017  . Rosacea   . GERD (gastroesophageal reflux disease)   . Central serous retinopathy   . IBS (irritable bowel syndrome)   . Hyperlipidemia, mixed 01/24/2017  . Dyspnea on effort 12/22/2016  . Anxiety, generalized 04/29/2016  . Diabetes mellitus without complication ([place]) 16/55/3748  . Cancer of the skin, basal cell 03/12/2016  . Contact dermatitis 04/23/2013  . Nodule, subcutaneous 07/24/2012  . Localized swelling, mass and lump, unspecified 07/24/2012  . Hypertension 12/20/2011  . Personal history of breast cancer 12/20/2011  . Obesity 12/20/2011    Joneen Boers PT, DPT   11/30/2018, 4:22 PM  [facility] [address], [place], [address] Phone: [phone]   Fax:  [phone]  Name: Jacqueline Deleon MRN: [id number] Date of Birth: 11-03-1960

## 2018-12-05 ENCOUNTER — Other Ambulatory Visit: Payer: Self-pay

## 2018-12-05 ENCOUNTER — Ambulatory Visit: Payer: 59

## 2018-12-05 DIAGNOSIS — R262 Difficulty in walking, not elsewhere classified: Secondary | ICD-10-CM

## 2018-12-05 DIAGNOSIS — M6281 Muscle weakness (generalized): Secondary | ICD-10-CM | POA: Diagnosis not present

## 2018-12-05 NOTE — Therapy (Signed)
[name] [address] [place], [address] Phone: [phone]   Fax:  [phone]  Physical Therapy Treatment  Patient Details  Name: Jacqueline Deleon MRN: [id number] Date of Birth: 09-Jun-1961 Referring Provider (PT): [place], [place] T, [place]   Encounter Date: 12/05/2018  PT End of Session - 12/05/18 1503    Visit Number  22    Number of Visits  37    Date for PT Re-Evaluation  01/02/19    Authorization Type  [employer] reporting period from 11/21/2018    Authorization - Visit Number  5    Authorization - Number of Visits  10    PT Start Time  1504    PT Stop Time  1554    PT Time Calculation (min)  50 min    Activity Tolerance  Patient tolerated treatment well;Patient limited by pain    Behavior During Therapy  [facility] for tasks assessed/performed       Past Medical History:  Diagnosis Date  . Arthritis   . 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 Breast mass 08/2006, 03/2009   left breast biopsy fibroadenoma (2008) right breast biopsy benign (2010)  . 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 Central serous retinopathy   . Fatty liver   . GERD (gastroesophageal reflux disease)   . Gestational diabetes   . Headache    migraines  . Heart murmur   . History of abnormal mammogram 08/2006, 01/2008, 03/2009  . Hypertension   . IBS (irritable bowel syndrome)   . Joint disorder    hx of left ankle tendonitis, bursitis, heel spur  . Obesity 2018   BMI 45  . Pelvic pain    adhesions and adenomyosis  . Rosacea     Past Surgical History:  Procedure Laterality Date  . BREAST BIOPSY Left 08/2006   benign fibroadenoma  . BREAST EXCISIONAL BIOPSY Right 02/26/2008   right breast invasive mam ca with rad partial mastectomy   . BREAST SURGERY Right    x2/ Dr Tamala Julian  . CARDIAC CATHETERIZATION  2006  . CESAREAN SECTION  04/02/1998  . CHOLECYSTECTOMY  04/2010   Dr. Smith/ laprascopic surgery  . COLONOSCOPY  04/04/2000  . ESOPHAGOGASTRODUODENOSCOPY (EGD) WITH PROPOFOL N/A 05/01/2015   Procedure: ESOPHAGOGASTRODUODENOSCOPY (EGD) WITH PROPOFOL;  Surgeon: Hulen Luster, MD;  Location: [facility] ENDOSCOPY;  Service: Gastroenterology;  Laterality: N/A;  . EYE SURGERY  08/2012   laser surgery on retina/ DR Appenzeller  . FINGER SURGERY     repair of tendon in right index, Dr. Francesco Sor in [place]  . FOOT SURGERY     Dr. Milinda Pointer  . IRRIGATION AND DEBRIDEMENT SEBACEOUS CYST     right upper back  . LAPAROSCOPIC SUPRACERVICAL HYSTERECTOMY  06/2007   Dr Kincius/ adhesions/CPP/adenomyosis  . MASTECTOMY PARTIAL / LUMPECTOMY Right 01/2008   with sentinal lymph node biopsy/ infiltrating ductal cancer  . SKIN CANCER EXCISION     back and calves  . WISDOM TOOTH EXTRACTION  1991    There were no vitals filed for this visit.  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 Has been wearing her sneakers all day. Has not felt anything until 2 pm today. Has not yet worn her CAM boot.   Pt tries to get up at least every  2 hours such as bathroom breaks, walking to the front of the building (about 108 ft).  Was fint after last session.     Pertinent History   Patient reports 07/19/2018 after having stepped hard on her left foot  not using the cam walker using her regular tennis shoes she had a sharp pain around the cuboid and radiating up her leg.  Radiographs show a  tear in the peroneus longus tendon.  She has tenderness on palpation of the cuboid    How long can you walk comfortably?  5-10 mins standing and waking (with boot donned)    Patient Stated Goals  to walk and stand without pain.     Currently in Pain?  Yes    Pain Score  3    lateral plantar foot pain   Pain Onset  More than a month ago                               PT Education - 12/05/18 1506    Education Details  ther-ex    Person(s)  Educated  Patient    Methods  Explanation;Demonstration;Tactile cues;Verbal cues    Comprehension  Verbalized understanding;Returned demonstration       Objectives  Pt states that her blood pressure is controlled with medication  L lateral shift posture during gait  Limited L ankle DF observed during gait without CAM boot    MedbridgeAccess Code: [email or web address]   Manual therapy:     Prone STM L lateral gastroc muscle to decrease tension Prone STM L lateral plantar foot  Therapeutic Exercise:  Seated L ankle PF/DF on rocker board 2 min  prone L toe curls 10x 2 seconds for 3 sets  Seated R lateral shift isometrics in neutral to counter L lateral shift  10x10 seconds for 2 sets  Seated trunk flexion isometrics, manually resisted by PT 10x10 seconds for 2 sets   Seated B ankle DF resisting 2 kg medicine ball 10x2 then 10x5 seconds    No change in lateral plantar foot pain after exercises prior to performing manual therapy    Prone manually resisted L ankle DF concentric and eccentric contraction 10x  Prone manually resisted L ankle IV 10x3 with 2 second holds  Prone L glute max extension 5x10 seconds, then 3x10 seconds   Gait x 64 ft multiple times throughout session to test effectiveness of treatment.    Improved exercise technique, movement at target joints, use of target muscles after min to mod verbal, visual, tactile cues.    Response to treatment  No change in lateral plantar foot pain today. Good muscle use felt with exercises. Pt tolerated session well without aggravation of symptoms.    Clinical impression Overall tolerating weaning out of the CAM boot fair. Able to not wear her CAM boot today at work, though most of her work consists of sitting with occasional walks to the bathroom or to the front of the building (about 108 ft) . Continued working on STM to plantar surface of L foot as well as STM to decrease tension to L lateral  gastroc muscle. Also worked on concentric and eccentric ankle DF and IV as ankle DF/PF movement, plantar muscle activation, and trunk muscle strengthening and posture. No decrease in L lateral plantar foot pain today with treatment. Overall, L lateral ankle pain at area of fibularis tendon has improved with pt consistently reporting no pain in that area. Main   problem currently is the symptoms at the plantar surface of her foot during gait. Pt tolerated session well without aggravation of symptoms. Pt will benefit from continued skilled physical therapy services to decrease pain, improve strength and function.        PT Short Term Goals - 11/21/18 1518      PT SHORT TERM GOAL #1   Title  Patient will report a worst pain of 3/10 on VAS in left ankle to improve tolerance with ADLs and reduced symptoms with activities.     Baseline  2/10 with CAM boot on at worst for the past 7 days (10/16/2018)    Time  4    Period  Weeks    Status  Partially Met    Target Date  09/19/18      PT SHORT TERM GOAL #2   Title  Patient will increase BLE gross strength to 3+/5, no pain as to improve functional strength for independent gait, increased standing tolerance and increased ADL ability.    Time  4    Period  Weeks    Status  Achieved    Target Date  09/19/18        PT Long Term Goals - 11/21/18 1538      PT LONG TERM GOAL #1   Title  Patient will increase lower extremity functional scale to >60/80 to demonstrate improved functional mobility and increased tolerance with ADLs.     Baseline  56/80 (10/11/2018)    Time  4    Period  Weeks    Status  On-going    Target Date  01/02/19      PT LONG TERM GOAL #2   Title  Patient will report a worst pain of 1/10 on VAS in  left ankle  to improve tolerance with ADLs and reduced symptoms with activities.     Baseline  2/10 L ankle pain with CAM boot on at most for the past 7 days (10/16/2018); 4/10 during 6MWT without boot on (11/21/18);     Time  6    Period   Weeks    Status  On-going    Target Date  01/02/19      PT LONG TERM GOAL #3   Title  Patient will increase six minute walk test distance to >2000 for progression to community ambulator and improve gait ability    Baseline  1325 ft (10/09/2018): 1300 ft (11/21/2018);     Time  6    Period  Weeks    Status  On-going    Target Date  01/02/19      PT LONG TERM GOAL #4   Title  Complete community, work and/or recreational activities without limitation due to current condition.     Baseline  has not yet come out of cam boot, returned to full duty work (11/21/2018);     Time  6    Period  Weeks    Status  New    Target Date  01/02/19            Plan - 12/05/18 1503    Clinical Impression Statement  Overall tolerating weaning out of the CAM boot fair. Able to not wear her CAM boot today at work, though most of her work consists of sitting with occasional walks to the bathroom or to the front of the building (about 108 ft) . Continued working on STM to plantar surface of L foot as well as STM to decrease tension to L   lateral gastroc muscle. Also worked on concentric and eccentric ankle DF and IV as ankle DF/PF movement, plantar muscle activation, and trunk muscle strengthening and posture. No decrease in L lateral plantar foot pain today with treatment. Overall, L lateral ankle pain at area of fibularis tendon has improved with pt consistently reporting no pain in that area. Main problem currently is the symptoms at the plantar surface of her foot during gait. Pt tolerated session well without aggravation of symptoms. Pt will benefit from continued skilled physical therapy services to decrease pain, improve strength and function.     Personal Factors and Comorbidities  Time since onset of injury/illness/exacerbation;Fitness    Examination-Activity Limitations  Squat;Locomotion Level;Stairs    Stability/Clinical Decision Making  Stable/Uncomplicated    Rehab Potential  Fair    PT Frequency  2x /  week    PT Duration  6 weeks    PT Treatment/Interventions  Electrical Stimulation;Aquatic Therapy;Cryotherapy;Moist Heat;Ultrasound;Gait training;Therapeutic activities;Therapeutic exercise;Patient/family education;Neuromuscular re-education;Manual techniques;Passive range of motion;Dry needling;Splinting;Taping    PT Next Visit Plan  [place], e-stim, strengthening left ankle, manual techniques    PT Home Exercise Plan  consult referring physician about when to wean out of cam boot/ update precautions.     Consulted and Agree with Plan of Care  Patient       Patient will benefit from skilled therapeutic intervention in order to improve the following deficits and impairments:  Abnormal gait, Decreased endurance, Decreased mobility, Difficulty walking, Obesity, Decreased activity tolerance, Decreased strength, Impaired flexibility  Visit Diagnosis: Muscle weakness (generalized)  Difficulty in walking, not elsewhere classified     Problem List Patient Active Problem List   Diagnosis Date Noted  . Cirrhosis of liver not due to alcohol ([facility]) 08/29/2018  . OSA on CPAP 05/15/2018  . Non-alcoholic fatty liver disease 03/06/2017  . Rosacea   . GERD (gastroesophageal reflux disease)   . Central serous retinopathy   . IBS (irritable bowel syndrome)   . Hyperlipidemia, mixed 01/24/2017  . Dyspnea on effort 12/22/2016  . Anxiety, generalized 04/29/2016  . Diabetes mellitus without complication ([facility]) 04/29/2016  . Cancer of the skin, basal cell 03/12/2016  . Contact dermatitis 04/23/2013  . Nodule, subcutaneous 07/24/2012  . Localized swelling, mass and lump, unspecified 07/24/2012  . Hypertension 12/20/2011  . Personal history of breast cancer 12/20/2011  . Obesity 12/20/2011    Miguel Laygo PT, DPT   12/05/2018, 5:10 PM  [facility] [address] [place], [place], [address] Phone: [phone]   Fax:  [phone]  Name:  Jacqueline Deleon MRN: [id number] Date of Birth: 09/15/1960   

## 2018-12-07 ENCOUNTER — Ambulatory Visit: Payer: 59

## 2018-12-07 ENCOUNTER — Other Ambulatory Visit: Payer: Self-pay

## 2018-12-07 DIAGNOSIS — R262 Difficulty in walking, not elsewhere classified: Secondary | ICD-10-CM | POA: Diagnosis not present

## 2018-12-07 DIAGNOSIS — M6281 Muscle weakness (generalized): Secondary | ICD-10-CM | POA: Diagnosis not present

## 2018-12-07 NOTE — Therapy (Signed)
[facility] [address] [place], [address] Phone: [phone]   Fax:  [phone]  Physical Therapy Treatment  Patient Details  Name: Jacqueline Deleon MRN: [id number] Date of Birth: [birth year] Referring Provider (PT): [place], [place] T, [place]   Encounter Date: 12/07/2018  PT End of Session - 12/07/18 1553    Visit Number  23    Number of Visits  37    Date for PT Re-Evaluation  01/02/19    Authorization Type  [employer] reporting period from 11/21/2018    Authorization - Visit Number  6    Authorization - Number of Visits  10    PT Start Time  1505    PT Stop Time  1545    PT Time Calculation (min)  40 min    Activity Tolerance  Patient limited by pain    Behavior During Therapy  [facility] for tasks assessed/performed       Past Medical History:  Diagnosis Date  . Arthritis   . 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 Breast mass 08/2006, 03/2009   left breast biopsy fibroadenoma (2008) right breast biopsy benign (2010)  . 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 Central serous retinopathy   . Fatty liver   . GERD (gastroesophageal reflux disease)   . Gestational diabetes   . Headache    migraines  . Heart murmur   . History of abnormal mammogram 08/2006, 01/2008, 03/2009  . Hypertension   . IBS (irritable bowel syndrome)   . Joint disorder    hx of left ankle tendonitis, bursitis, heel spur  . Obesity 2018   BMI 45  . Pelvic pain    adhesions and adenomyosis  . Rosacea     Past Surgical History:  Procedure Laterality Date  . BREAST BIOPSY Left 08/2006   benign fibroadenoma  . BREAST EXCISIONAL BIOPSY Right 02/26/2008   right breast invasive mam ca with rad partial mastectomy   . BREAST SURGERY Right    x2/ Dr Tamala Julian  . CARDIAC CATHETERIZATION  2006  . CESAREAN SECTION  04/02/1998  . CHOLECYSTECTOMY  04/2010   Dr. Smith/ laprascopic surgery  .  COLONOSCOPY  04/04/2000  . ESOPHAGOGASTRODUODENOSCOPY (EGD) WITH PROPOFOL N/A 05/01/2015   Procedure: ESOPHAGOGASTRODUODENOSCOPY (EGD) WITH PROPOFOL;  Surgeon: Hulen Luster, MD;  Location: [facility] ENDOSCOPY;  Service: Gastroenterology;  Laterality: N/A;  . EYE SURGERY  08/2012   laser surgery on retina/ DR Appenzeller  . FINGER SURGERY     repair of tendon in right index, Dr. Francesco Sor in [place]  . FOOT SURGERY     Dr. Milinda Pointer  . IRRIGATION AND DEBRIDEMENT SEBACEOUS CYST     right upper back  . LAPAROSCOPIC SUPRACERVICAL HYSTERECTOMY  06/2007   Dr Kincius/ adhesions/CPP/adenomyosis  . MASTECTOMY PARTIAL / LUMPECTOMY Right 01/2008   with sentinal lymph node biopsy/ infiltrating ductal cancer  . SKIN CANCER EXCISION     back and calves  . WISDOM TOOTH EXTRACTION  1991    There were no vitals filed for this visit.  Subjective Assessment - 12/07/18 1551    Subjective  Pt reported that her son and her were messing around, and he landed on top of her L foot. Stated it has been painful, swollen, and bruised since then and would like the PT to check it out.     Pertinent  History   Patient reports 07/19/2018 after having stepped hard on her left foot  not using the cam walker using her regular tennis shoes she had a sharp pain around the cuboid and radiating up her leg.  Radiographs show a  tear in the peroneus longus tendon.  She has tenderness on palpation of the cuboid    How long can you walk comfortably?  5-10 mins standing and waking (with boot donned)    Patient Stated Goals  to walk and stand without pain.     Currently in Pain?  Yes    Pain Score  8    with palpation to dorsum of foot   Pain Location  Foot    Pain Orientation  Left   dorsum of foot   Pain Descriptors / Indicators  Aching;Sharp;Sore    Pain Type  Acute pain    Pain Onset  More than a month ago       Objectives Pt had moderate swelling of dorsum of foot, very mild ecchymosis noted. TTP of tarsals (cuniforms) of L foot.  Tuning fork of cuniforms also may have produced some sharp pain per patient report. Pt able to ambulate >3 steps and no tenderness reported of malleoli.    Medbridge Access Code: [email or web address]    Manual therapy:   Prone STM L lateral gastroc muscle to decrease tension with "the stick" and with therapist hands Prone STM/IASTM to L achilles, heel, and plantar fascia     Therapeutic Exercise:    Prone manually resisted L ankle PF 2x10   Prone manually resisted L ankle IV and EV 10x3 sec holds   Prone L glute max extension 5x10 seconds, then 3x10 seconds   Gait x 64 ft to test effectiveness of treatment. Pt able to correct lateral shift with effort and demonstrated minimal gait deviations. Reported improvement in symptoms from pre session to post session.     Response to treatment  Pt reported improvement in symptoms of L heel pain at end of session. Dorsum pain unchanged.      Clinical impression Pt limited by acute onset of L foot pain due to trauma. PT instructed pt to monitor symptoms, wear boot as needed, and to ice. Also instructed pt to follow up with PT next session or phone physician if symptoms worsen/unable to ambulate. Pt able to perform isometric ankle exercises without increase in pain. IASTM/STM focus of session with pt reported improvement in symptoms.  Pt will benefit from continued skilled physical therapy services to decrease pain, improve strength and function.    PT Education - 12/07/18 1552    Education Details  therex, cam boot wearing, ice    Person(s) Educated  Patient    Methods  Explanation;Demonstration    Comprehension  Verbalized understanding       PT Short Term Goals - 11/21/18 1518      PT SHORT TERM GOAL #1   Title  Patient will report a worst pain of 3/10 on VAS in left ankle to improve tolerance with ADLs and reduced symptoms with activities.     Baseline  2/10 with CAM boot on at worst for the past 7 days (10/16/2018)    Time  4    Period  Weeks     Status  Partially Met    Target Date  09/19/18      PT SHORT TERM GOAL #2   Title  Patient will increase BLE gross strength to 3+/5, no pain as to improve  functional strength for independent gait, increased standing tolerance and increased ADL ability.    Time  4    Period  Weeks    Status  Achieved    Target Date  09/19/18        PT Long Term Goals - 11/21/18 1538      PT LONG TERM GOAL #1   Title  Patient will increase lower extremity functional scale to >60/80 to demonstrate improved functional mobility and increased tolerance with ADLs.     Baseline  56/80 (10/11/2018)    Time  4    Period  Weeks    Status  On-going    Target Date  01/02/19      PT LONG TERM GOAL #2   Title  Patient will report a worst pain of 1/10 on VAS in  left ankle  to improve tolerance with ADLs and reduced symptoms with activities.     Baseline  2/10 L ankle pain with CAM boot on at most for the past 7 days (10/16/2018); 4/10 during 6MWT without boot on (11/21/18);     Time  6    Period  Weeks    Status  On-going    Target Date  01/02/19      PT LONG TERM GOAL #3   Title  Patient will increase six minute walk test distance to >2000 for progression to community ambulator and improve gait ability    Baseline  1325 ft (10/09/2018): 1300 ft (11/21/2018);     Time  6    Period  Weeks    Status  On-going    Target Date  01/02/19      PT LONG TERM GOAL #4   Title  Complete community, work and/or recreational activities without limitation due to current condition.     Baseline  has not yet come out of cam boot, returned to full duty work (11/21/2018);     Time  6    Period  Weeks    Status  New    Target Date  01/02/19            Plan - 12/07/18 1603    Clinical Impression Statement  Pt limited by acute onset of L foot pain due to trauma. PT instructed pt to monitor symptoms, wear boot as needed, and to ice. Also instructed pt to follow up with PT next session or phone physician if symptoms  worsen/unable to ambulate. Pt able to perform isometric ankle exercises without increase in pain. IASTM/STM focus of session with pt reported improvement in symptoms.  Pt will benefit from continued skilled physical therapy services to decrease pain, improve strength and function.     Personal Factors and Comorbidities  Time since onset of injury/illness/exacerbation;Fitness    Examination-Activity Limitations  Squat;Locomotion Level;Stairs    Stability/Clinical Decision Making  Stable/Uncomplicated    Rehab Potential  Fair    PT Frequency  2x / week    PT Duration  6 weeks    PT Treatment/Interventions  Electrical Stimulation;Aquatic Therapy;Cryotherapy;Moist Heat;Ultrasound;Gait training;Therapeutic activities;Therapeutic exercise;Patient/family education;Neuromuscular re-education;Manual techniques;Passive range of motion;Dry needling;Splinting;Taping    PT Next Visit Plan  [place], e-stim, strengthening left ankle, manual techniques    PT Home Exercise Plan  consult referring physician about when to wean out of cam boot/ update precautions.     Consulted and Agree with Plan of Care  Patient       Patient will benefit from skilled therapeutic intervention in order to improve the following deficits  and impairments:  Abnormal gait, Decreased endurance, Decreased mobility, Difficulty walking, Obesity, Decreased activity tolerance, Decreased strength, Impaired flexibility  Visit Diagnosis: Muscle weakness (generalized)  Difficulty in walking, not elsewhere classified     Problem List Patient Active Problem List   Diagnosis Date Noted  . 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 OSA on CPAP 05/15/2018  . Non-alcoholic fatty liver disease 03/06/2017  . Rosacea   . GERD (gastroesophageal reflux disease)   . Central serous retinopathy   . IBS (irritable bowel syndrome)   . Hyperlipidemia, mixed 01/24/2017  . Dyspnea on effort 12/22/2016  . Anxiety, generalized 04/29/2016  .  Diabetes mellitus without complication ([place]) 51/46/0479  . Cancer of the skin, basal cell 03/12/2016  . Contact dermatitis 04/23/2013  . Nodule, subcutaneous 07/24/2012  . Localized swelling, mass and lump, unspecified 07/24/2012  . Hypertension 12/20/2011  . Personal history of breast cancer 12/20/2011  . Obesity 12/20/2011    Lieutenant Diego PT, DPT 4:03 PM,12/07/18 [facility] [address] [place], [address] Phone: [phone]   Fax:  [phone]  Name: Jacqueline Deleon MRN: [id number] Date of Birth: Sep 22, 1960

## 2018-12-12 ENCOUNTER — Ambulatory Visit: Payer: 59 | Attending: Podiatry

## 2018-12-12 ENCOUNTER — Other Ambulatory Visit: Payer: Self-pay

## 2018-12-12 DIAGNOSIS — R262 Difficulty in walking, not elsewhere classified: Secondary | ICD-10-CM | POA: Insufficient documentation

## 2018-12-12 DIAGNOSIS — M6281 Muscle weakness (generalized): Secondary | ICD-10-CM | POA: Diagnosis not present

## 2018-12-12 NOTE — Therapy (Signed)
[facility] [address], [place], [address] Phone: [phone]   Fax:  [phone]  Physical Therapy Treatment  Patient Details  Name: Jacqueline Deleon MRN: [id number] Date of Birth: October 18, 1960 Referring Provider (PT): [place], [place] T, [place]   Encounter Date: 12/12/2018  PT End of Session - 12/12/18 1702    Visit Number  24    Number of Visits  37    Date for PT Re-Evaluation  01/02/19    Authorization Type  [employer] reporting period from 11/21/2018    Authorization - Visit Number  7    Authorization - Number of Visits  10    PT Start Time  1702    PT Stop Time  1750    PT Time Calculation (min)  48 min    Activity Tolerance  Patient limited by pain    Behavior During Therapy  [facility] for tasks assessed/performed       Past Medical History:  Diagnosis Date  . Arthritis   . 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 Breast mass 08/2006, 03/2009   left breast biopsy fibroadenoma (2008) right breast biopsy benign (2010)  . 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 Central serous retinopathy   . Fatty liver   . GERD (gastroesophageal reflux disease)   . Gestational diabetes   . Headache    migraines  . Heart murmur   . History of abnormal mammogram 08/2006, 01/2008, 03/2009  . Hypertension   . IBS (irritable bowel syndrome)   . Joint disorder    hx of left ankle tendonitis, bursitis, heel spur  . Obesity 2018   BMI 45  . Pelvic pain    adhesions and adenomyosis  . Rosacea     Past Surgical History:  Procedure Laterality Date  . BREAST BIOPSY Left 08/2006   benign fibroadenoma  . BREAST EXCISIONAL BIOPSY Right 02/26/2008   right breast invasive mam ca with rad partial mastectomy   . BREAST SURGERY Right    x2/ Dr Tamala Julian  . CARDIAC CATHETERIZATION  2006  . CESAREAN SECTION  04/02/1998  . CHOLECYSTECTOMY  04/2010   Dr. Smith/ laprascopic surgery  .  COLONOSCOPY  04/04/2000  . ESOPHAGOGASTRODUODENOSCOPY (EGD) WITH PROPOFOL N/A 05/01/2015   Procedure: ESOPHAGOGASTRODUODENOSCOPY (EGD) WITH PROPOFOL;  Surgeon: Hulen Luster, MD;  Location: [facility] ENDOSCOPY;  Service: Gastroenterology;  Laterality: N/A;  . EYE SURGERY  08/2012   laser surgery on retina/ DR Appenzeller  . FINGER SURGERY     repair of tendon in right index, Dr. Francesco Sor in [place]  . FOOT SURGERY     Dr. Milinda Pointer  . IRRIGATION AND DEBRIDEMENT SEBACEOUS CYST     right upper back  . LAPAROSCOPIC SUPRACERVICAL HYSTERECTOMY  06/2007   Dr Kincius/ adhesions/CPP/adenomyosis  . MASTECTOMY PARTIAL / LUMPECTOMY Right 01/2008   with sentinal lymph node biopsy/ infiltrating ductal cancer  . SKIN CANCER EXCISION     back and calves  . WISDOM TOOTH EXTRACTION  1991    There were no vitals filed for this visit.  Subjective Assessment - 12/12/18 1703    Subjective  L foot is better, so is the bruise. Has been wearing her CAM boot. 2/10 L lateral plantar foot and anterior ankle joint pain. Not as swollen as it was last Thursday.     Pertinent History   Patient reports 07/19/2018  after having stepped hard on her left foot  not using the cam walker using her regular tennis shoes she had a sharp pain around the cuboid and radiating up her leg.  Radiographs show a  tear in the peroneus longus tendon.  She has tenderness on palpation of the cuboid    How long can you walk comfortably?  5-10 mins standing and waking (with boot donned)    Patient Stated Goals  to walk and stand without pain.     Currently in Pain?  Yes    Pain Score  2     Pain Onset  More than a month ago                               PT Education - 12/12/18 1751    Education Details  ther-ex, HEP    Person(s) Educated  Patient    Methods  Explanation;Demonstration;Tactile cues;Verbal cues    Comprehension  Verbalized understanding;Returned demonstration      Objectives  MedbridgeAccess Code:  [email or web address]  Manual therapy:  Prone STM L lateral gastroc muscle to decrease tension Prone STM L Achilles and lateral plantar foot   Therapeutic Exercise:  Prone manually resisted L ankle PF concentric and eccentric 10x3  Prone manually resisted L ankle IV concentric and eccentric, 10x3   Prone manually resisted L ankle DF concentric and eccentric 10x3  Prone manually resisted L toe curls concentric and eccentric 10x3  Prone manual plantar fascia stretch 15 seconds x 5   Decreased foot pain to 1/10 with gait afterwards (no anterior ankle pain but still has lateral plantar foot pain)    Seated L hip extension isometrics 10x3 with 5 second holds   Standing heel toe raises 4x. Increased L lateral plantar foot pain  Eases with rest  Standing ankle DF with B UE assist 10x5 seconds    Standing ankle PF/DF on rocker board with B UE assist 10x3  Increased L lateral plantar foot pain during 3rd set of 10  Seated B ankle DF/PF on rocker board x 2 minutes  L heel pain, no L lateral plantar foot pain during exercise    Gait x 64 ft to test effectiveness of treatment multiple times throughout session.   Decreased L lateral plantar foot pain to 1/10 and no anterior ankle pain after session.    Improved exercise technique, movement at target joints, use of target muscles after min to mod verbal, visual, tactile cues.   Response to treatment Pt reported improvement in symptoms of L plantar foot and anterior ankle pain at end of session.    Clinical impression Continued working on decreasing L gastroc muscle and plantar fascia tension, as well as improving intrinsic foot and ankle muscle strength and promoting gentle ankle movements. No L anterior ankle pain and decreased L lateral plantar foot pain to 1/10 with gait after session. Pt will benefit from continued skilled physical therapy services to decrease pain, improve strength, function, and decrease difficulty  ambulating.     PT Short Term Goals - 11/21/18 1518      PT SHORT TERM GOAL #1   Title  Patient will report a worst pain of 3/10 on VAS in left ankle to improve tolerance with ADLs and reduced symptoms with activities.     Baseline  2/10 with CAM boot on at worst for the past 7 days (10/16/2018)    Time  4  Period  Weeks    Status  Partially Met    Target Date  09/19/18      PT SHORT TERM GOAL #2   Title  Patient will increase BLE gross strength to 3+/5, no pain as to improve functional strength for independent gait, increased standing tolerance and increased ADL ability.    Time  4    Period  Weeks    Status  Achieved    Target Date  09/19/18        PT Long Term Goals - 11/21/18 1538      PT LONG TERM GOAL #1   Title  Patient will increase lower extremity functional scale to >60/80 to demonstrate improved functional mobility and increased tolerance with ADLs.     Baseline  56/80 (10/11/2018)    Time  4    Period  Weeks    Status  On-going    Target Date  01/02/19      PT LONG TERM GOAL #2   Title  Patient will report a worst pain of 1/10 on VAS in  left ankle  to improve tolerance with ADLs and reduced symptoms with activities.     Baseline  2/10 L ankle pain with CAM boot on at most for the past 7 days (10/16/2018); 4/10 during 6MWT without boot on (11/21/18);     Time  6    Period  Weeks    Status  On-going    Target Date  01/02/19      PT LONG TERM GOAL #3   Title  Patient will increase six minute walk test distance to >2000 for progression to community ambulator and improve gait ability    Baseline  1325 ft (10/09/2018): 1300 ft (11/21/2018);     Time  6    Period  Weeks    Status  On-going    Target Date  01/02/19      PT LONG TERM GOAL #4   Title  Complete community, work and/or recreational activities without limitation due to current condition.     Baseline  has not yet come out of cam boot, returned to full duty work (11/21/2018);     Time  6    Period  Weeks     Status  New    Target Date  01/02/19            Plan - 12/12/18 1702    Clinical Impression Statement  Continued working on decreasing L gastroc muscle and plantar fascia tension, as well as improving intrinsic foot and ankle muscle strength and promoting gentle ankle movements. No L anterior ankle pain and decreased L lateral plantar foot pain to 1/10 with gait after session. Pt will benefit from continued skilled physical therapy services to decrease pain, improve strength, function, and decrease difficulty ambulating.     Personal Factors and Comorbidities  Time since onset of injury/illness/exacerbation;Fitness    Examination-Activity Limitations  Squat;Locomotion Level;Stairs    Stability/Clinical Decision Making  Stable/Uncomplicated    Rehab Potential  Fair    PT Frequency  2x / week    PT Duration  6 weeks    PT Treatment/Interventions  Electrical Stimulation;Aquatic Therapy;Cryotherapy;Moist Heat;Ultrasound;Gait training;Therapeutic activities;Therapeutic exercise;Patient/family education;Neuromuscular re-education;Manual techniques;Passive range of motion;Dry needling;Splinting;Taping    PT Next Visit Plan  [place], e-stim, strengthening left ankle, manual techniques    PT Home Exercise Plan  consult referring physician about when to wean out of cam boot/ update precautions.     Consulted and Agree  with Plan of Care  Patient       Patient will benefit from skilled therapeutic intervention in order to improve the following deficits and impairments:  Abnormal gait, Decreased endurance, Decreased mobility, Difficulty walking, Obesity, Decreased activity tolerance, Decreased strength, Impaired flexibility  Visit Diagnosis: Muscle weakness (generalized)  Difficulty in walking, not elsewhere classified     Problem List Patient Active Problem List   Diagnosis Date Noted  . 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 OSA on CPAP 05/15/2018  . Non-alcoholic fatty  liver disease 03/06/2017  . Rosacea   . GERD (gastroesophageal reflux disease)   . Central serous retinopathy   . IBS (irritable bowel syndrome)   . Hyperlipidemia, mixed 01/24/2017  . Dyspnea on effort 12/22/2016  . Anxiety, generalized 04/29/2016  . Diabetes mellitus without complication ([place]) 15/83/0940  . Cancer of the skin, basal cell 03/12/2016  . Contact dermatitis 04/23/2013  . Nodule, subcutaneous 07/24/2012  . Localized swelling, mass and lump, unspecified 07/24/2012  . Hypertension 12/20/2011  . Personal history of breast cancer 12/20/2011  . Obesity 12/20/2011    Joneen Boers PT, DPT   12/12/2018, 6:01 PM  [facility] [address] [place], [address] Phone: [phone]   Fax:  [phone]  Name: ARTEMIS KOLLER MRN: [id number] Date of Birth: 10-27-1960

## 2018-12-12 NOTE — Patient Instructions (Signed)
Pt was recommended to continue her toe curl exercises  Also added plantar foot stretch 15 seconds x 5   Pt can try her ankle green band exercises in a pain free range of motion.   Pt demonstrated and verbalized understanding.

## 2018-12-14 ENCOUNTER — Ambulatory Visit: Payer: 59

## 2018-12-14 ENCOUNTER — Other Ambulatory Visit: Payer: Self-pay

## 2018-12-14 DIAGNOSIS — M6281 Muscle weakness (generalized): Secondary | ICD-10-CM

## 2018-12-14 DIAGNOSIS — R262 Difficulty in walking, not elsewhere classified: Secondary | ICD-10-CM | POA: Diagnosis not present

## 2018-12-14 NOTE — Therapy (Signed)
[facility] [address] [place], [address] Phone: [phone]   Fax:  [phone]  Physical Therapy Treatment  Patient Details  Name: Jacqueline Deleon MRN: [id number] Date of Birth: 1961/04/04 Referring Provider (PT): [place], [place], [place]   Encounter Date: 12/14/2018  PT End of Session - 12/14/18 0803    Visit Number  25    Number of Visits  37    Date for PT Re-Evaluation  01/02/19    Authorization Type  [employer] reporting period from 11/21/2018    Authorization - Visit Number  8    Authorization - Number of Visits  10    PT Start Time  0803    PT Stop Time  0851    PT Time Calculation (min)  48 min    Activity Tolerance  Patient limited by pain    Behavior During Therapy  [facility] for tasks assessed/performed       Past Medical History:  Diagnosis Date  . Arthritis   . 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 Breast mass 08/2006, 03/2009   left breast biopsy fibroadenoma (2008) right breast biopsy benign (2010)  . 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 Central serous retinopathy   . Fatty liver   . GERD (gastroesophageal reflux disease)   . Gestational diabetes   . Headache    migraines  . Heart murmur   . History of abnormal mammogram 08/2006, 01/2008, 03/2009  . Hypertension   . IBS (irritable bowel syndrome)   . Joint disorder    hx of left ankle tendonitis, bursitis, heel spur  . Obesity 2018   BMI 45  . Pelvic pain    adhesions and adenomyosis  . Rosacea     Past Surgical History:  Procedure Laterality Date  . BREAST BIOPSY Left 08/2006   benign fibroadenoma  . BREAST EXCISIONAL BIOPSY Right 02/26/2008   right breast invasive mam ca with rad partial mastectomy   . BREAST SURGERY Right    x2/ Dr Tamala Julian  . CARDIAC CATHETERIZATION  2006  . CESAREAN SECTION  04/02/1998  . CHOLECYSTECTOMY  04/2010   Dr. Smith/ laprascopic surgery  .  COLONOSCOPY  04/04/2000  . ESOPHAGOGASTRODUODENOSCOPY (EGD) WITH PROPOFOL N/A 05/01/2015   Procedure: ESOPHAGOGASTRODUODENOSCOPY (EGD) WITH PROPOFOL;  Surgeon: Hulen Luster, MD;  Location: [facility] ENDOSCOPY;  Service: Gastroenterology;  Laterality: N/A;  . EYE SURGERY  08/2012   laser surgery on retina/ DR Appenzeller  . FINGER SURGERY     repair of tendon in right index, Dr. Francesco Sor in [place]  . FOOT SURGERY     Dr. Milinda Pointer  . IRRIGATION AND DEBRIDEMENT SEBACEOUS CYST     right upper back  . LAPAROSCOPIC SUPRACERVICAL HYSTERECTOMY  06/2007   Dr Kincius/ adhesions/CPP/adenomyosis  . MASTECTOMY PARTIAL / LUMPECTOMY Right 01/2008   with sentinal lymph node biopsy/ infiltrating ductal cancer  . SKIN CANCER EXCISION     back and calves  . WISDOM TOOTH EXTRACTION  1991    There were no vitals filed for this visit.  Subjective Assessment - 12/14/18 0804    Subjective  Not feeling very limber this morning. L foot feels pretty good. Has not noticed any pain this morning. Usually has a 1-2/10 in the morning.  L foot was pretty good yesterday. Wore the boot until 10 am, then shoe from 10-2 pm,  then kept the boot on until bed.  The top of the foot is still tender but getting better.  Feels like a bruise.     Pertinent History   Patient reports 07/19/2018 after having stepped hard on her left foot  not using the cam walker using her regular tennis shoes she had a sharp pain around the cuboid and radiating up her leg.  Radiographs show a  tear in the peroneus longus tendon.  She has tenderness on palpation of the cuboid    How long can you walk comfortably?  5-10 mins standing and waking (with boot donned)    Patient Stated Goals  to walk and stand without pain.     Currently in Pain?  No/denies    Pain Score  0-No pain    Pain Onset  More than a month ago                               PT Education - 12/14/18 0809    Education Details  ther-ex, HEP    Person(s) Educated   Patient    Methods  Explanation;Demonstration;Tactile cues;Verbal cues;Handout    Comprehension  Verbalized understanding;Returned demonstration        Objectives  MedbridgeAccess Code: [email or web address]    Therapeutic Exercise:    NuStep seat 6, arms 6 for 3 minutes and 30 seconds, level 1 to promote movement, decrease stiffness, improve blood flow.   Increased lateral plantar symptoms   No change in symptoms with standing L lateral shift correction  Seated L hip ER 10x5 seconds  Then with yellow band resistance 10x2   Resisted ankle IV green band 10x3  Reviewed and given as part of her HEP. Pt demonstrated and verbalized understanding.   Resisted L ankle PF green band 10x3    Forward wedding march 20 ft, cues for femoral control  L plantar foot discomfort  Backwards walk 20 ft  L plantar foot discomfort  Standing mini squat with B UE assist 10x3, cues for femoral control  No complain of increased L foot pain  Standing static backwards mini lunge with B UE assist 10x   L lateral plantar foot discomfort which gradually eases with rest.   Heel walking 5 ft x 8.  No foot pain.   Side stepping 15 ft to the L and 15 ft to the R   L lateral plantar foot pain both ways.   Standing glute max squeeze 10x10 seconds    Improved exercise technique, movement at target joints, use of target muscles after mod verbal, visual, tactile cues.    1/10 L lateral plantar foot pain after session     Response to treatment Slight increase in L lateral plantar foot symptoms with Nustep, wedding march, backward walk, side stepping, and mini backwards static lunge but eases with rest. Slight increase in symptoms to 1/10 at end of session.    Clinical impression Pt able to tolerate closed chain exercises this morning without CAM boot with 1/10 L lateral plantar foot pain at end of session. Worked on ankle strengthening as well as quad and glute strengthening with cues for femoral  control to help decrease plantar pressure on L foot and to help promote weight bearing tolerance to L LE.  Slight increase in symptoms with exercises but pt demonstrates only 1/10 L lateral plantar foot pain at end of session. Pt will benefit from continued skilled physical therapy services to decrease  pain, improve strength, function, and decrease difficulty ambulating.       PT Short Term Goals - 11/21/18 1518      PT SHORT TERM GOAL #1   Title  Patient will report a worst pain of 3/10 on VAS in left ankle to improve tolerance with ADLs and reduced symptoms with activities.     Baseline  2/10 with CAM boot on at worst for the past 7 days (10/16/2018)    Time  4    Period  Weeks    Status  Partially Met    Target Date  09/19/18      PT SHORT TERM GOAL #2   Title  Patient will increase BLE gross strength to 3+/5, no pain as to improve functional strength for independent gait, increased standing tolerance and increased ADL ability.    Time  4    Period  Weeks    Status  Achieved    Target Date  09/19/18        PT Long Term Goals - 11/21/18 1538      PT LONG TERM GOAL #1   Title  Patient will increase lower extremity functional scale to >60/80 to demonstrate improved functional mobility and increased tolerance with ADLs.     Baseline  56/80 (10/11/2018)    Time  4    Period  Weeks    Status  On-going    Target Date  01/02/19      PT LONG TERM GOAL #2   Title  Patient will report a worst pain of 1/10 on VAS in  left ankle  to improve tolerance with ADLs and reduced symptoms with activities.     Baseline  2/10 L ankle pain with CAM boot on at most for the past 7 days (10/16/2018); 4/10 during 6MWT without boot on (11/21/18);     Time  6    Period  Weeks    Status  On-going    Target Date  01/02/19      PT LONG TERM GOAL #3   Title  Patient will increase six minute walk test distance to >2000 for progression to community ambulator and improve gait ability    Baseline  1325 ft  (10/09/2018): 1300 ft (11/21/2018);     Time  6    Period  Weeks    Status  On-going    Target Date  01/02/19      PT LONG TERM GOAL #4   Title  Complete community, work and/or recreational activities without limitation due to current condition.     Baseline  has not yet come out of cam boot, returned to full duty work (11/21/2018);     Time  6    Period  Weeks    Status  New    Target Date  01/02/19            Plan - 12/14/18 0754    Clinical Impression Statement  Pt able to tolerate closed chain exercises this morning without CAM boot with 1/10 L lateral plantar foot pain at end of session. Worked on ankle strengthening as well as quad and glute strengthening with cues for femoral control to help decrease plantar pressure on L foot and to help promote weight bearing tolerance to L LE.  Slight increase in symptoms with exercises but pt demonstrates only 1/10 L lateral plantar foot pain at end of session. Pt will benefit from continued skilled physical therapy services to decrease pain, improve strength, function, and  decrease difficulty ambulating.      Personal Factors and Comorbidities  Time since onset of injury/illness/exacerbation;Fitness    Examination-Activity Limitations  Squat;Locomotion Level;Stairs    Stability/Clinical Decision Making  Stable/Uncomplicated    Rehab Potential  Fair    PT Frequency  2x / week    PT Duration  6 weeks    PT Treatment/Interventions  Electrical Stimulation;Aquatic Therapy;Cryotherapy;Moist Heat;Ultrasound;Gait training;Therapeutic activities;Therapeutic exercise;Patient/family education;Neuromuscular re-education;Manual techniques;Passive range of motion;Dry needling;Splinting;Taping    PT Next Visit Plan  [place], e-stim, strengthening left ankle, manual techniques    PT Home Exercise Plan  consult referring physician about when to wean out of cam boot/ update precautions.     Consulted and Agree with Plan of Care  Patient       Patient will  benefit from skilled therapeutic intervention in order to improve the following deficits and impairments:  Abnormal gait, Decreased endurance, Decreased mobility, Difficulty walking, Obesity, Decreased activity tolerance, Decreased strength, Impaired flexibility  Visit Diagnosis: Difficulty in walking, not elsewhere classified  Muscle weakness (generalized)     Problem List Patient Active Problem List   Diagnosis Date Noted  . 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 OSA on CPAP 05/15/2018  . Non-alcoholic fatty liver disease 03/06/2017  . Rosacea   . GERD (gastroesophageal reflux disease)   . Central serous retinopathy   . IBS (irritable bowel syndrome)   . Hyperlipidemia, mixed 01/24/2017  . Dyspnea on effort 12/22/2016  . Anxiety, generalized 04/29/2016  . Diabetes mellitus without complication ([place]) 28/83/3744  . Cancer of the skin, basal cell 03/12/2016  . Contact dermatitis 04/23/2013  . Nodule, subcutaneous 07/24/2012  . Localized swelling, mass and lump, unspecified 07/24/2012  . Hypertension 12/20/2011  . Personal history of breast cancer 12/20/2011  . Obesity 12/20/2011    Joneen Boers PT, DPT   12/14/2018, 12:52 PM  [facility] [address] [place], [address] Phone: [phone]   Fax:  [phone]  Name: Jacqueline Deleon MRN: [id number] Date of Birth: 11/05/1960

## 2018-12-14 NOTE — Patient Instructions (Addendum)
MedbridgeAccess Code: [email or web address]   Long Sitting Ankle Inversion with Resistance  Green band 10x3  Ankle Plantar Flexion with Resistance  Green band 10x3    Ankle Dorsiflexion with Resistance  Green band 10x3  Heel walking    Pt can perform short walks at her treadmill, ease off and take a break if her foot bothers her. Pt demonstrated and verbalized understanding.

## 2018-12-19 ENCOUNTER — Ambulatory Visit: Payer: 59

## 2018-12-19 ENCOUNTER — Other Ambulatory Visit: Payer: Self-pay

## 2018-12-19 DIAGNOSIS — M6281 Muscle weakness (generalized): Secondary | ICD-10-CM | POA: Diagnosis not present

## 2018-12-19 DIAGNOSIS — R262 Difficulty in walking, not elsewhere classified: Secondary | ICD-10-CM | POA: Diagnosis not present

## 2018-12-19 NOTE — Therapy (Signed)
[facility] [address] [place], [address] Phone: [phone]   Fax:  [phone]  Physical Therapy Treatment  Patient Details  Name: Jacqueline Deleon MRN: [id number] Date of Birth: 1961-02-18 Referring Provider (PT): [place], [place], [place]   Encounter Date: 12/19/2018  PT End of Session - 12/19/18 1700    Visit Number  26    Number of Visits  37    Date for PT Re-Evaluation  01/02/19    Authorization Type  [employer] reporting period from 11/21/2018    Authorization - Visit Number  9    Authorization - Number of Visits  10    PT Start Time  1700    PT Stop Time  1745    PT Time Calculation (min)  45 min    Activity Tolerance  Patient limited by pain    Behavior During Therapy  [facility] for tasks assessed/performed       Past Medical History:  Diagnosis Date  . Arthritis   . 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 Breast mass 08/2006, 03/2009   left breast biopsy fibroadenoma (2008) right breast biopsy benign (2010)  . 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 Central serous retinopathy   . Fatty liver   . GERD (gastroesophageal reflux disease)   . Gestational diabetes   . Headache    migraines  . Heart murmur   . History of abnormal mammogram 08/2006, 01/2008, 03/2009  . Hypertension   . IBS (irritable bowel syndrome)   . Joint disorder    hx of left ankle tendonitis, bursitis, heel spur  . Obesity 2018   BMI 45  . Pelvic pain    adhesions and adenomyosis  . Rosacea     Past Surgical History:  Procedure Laterality Date  . BREAST BIOPSY Left 08/2006   benign fibroadenoma  . BREAST EXCISIONAL BIOPSY Right 02/26/2008   right breast invasive mam ca with rad partial mastectomy   . BREAST SURGERY Right    x2/ Dr Tamala Julian  . CARDIAC CATHETERIZATION  2006  . CESAREAN SECTION  04/02/1998  . CHOLECYSTECTOMY  04/2010   Dr. Smith/ laprascopic surgery  .  COLONOSCOPY  04/04/2000  . ESOPHAGOGASTRODUODENOSCOPY (EGD) WITH PROPOFOL N/A 05/01/2015   Procedure: ESOPHAGOGASTRODUODENOSCOPY (EGD) WITH PROPOFOL;  Surgeon: Hulen Luster, MD;  Location: [facility] ENDOSCOPY;  Service: Gastroenterology;  Laterality: N/A;  . EYE SURGERY  08/2012   laser surgery on retina/ DR Appenzeller  . FINGER SURGERY     repair of tendon in right index, Dr. Francesco Sor in [place]  . FOOT SURGERY     Dr. Milinda Pointer  . IRRIGATION AND DEBRIDEMENT SEBACEOUS CYST     right upper back  . LAPAROSCOPIC SUPRACERVICAL HYSTERECTOMY  06/2007   Dr Kincius/ adhesions/CPP/adenomyosis  . MASTECTOMY PARTIAL / LUMPECTOMY Right 01/2008   with sentinal lymph node biopsy/ infiltrating ductal cancer  . SKIN CANCER EXCISION     back and calves  . WISDOM TOOTH EXTRACTION  1991    There were no vitals filed for this visit.  Subjective Assessment - 12/19/18 1702    Subjective  L foot is doing pretty good. Second day in a row wearing her tennis shoe all day. 0.5/10 pain today. Has been doing her band exercises.  The top of her foot is still tender. No visible bruising.     Pertinent  History   Patient reports 07/19/2018 after having stepped hard on her left foot  not using the cam walker using her regular tennis shoes she had a sharp pain around the cuboid and radiating up her leg.  Radiographs show a  tear in the peroneus longus tendon.  She has tenderness on palpation of the cuboid    How long can you walk comfortably?  5-10 mins standing and waking (with boot donned)    Patient Stated Goals  to walk and stand without pain.     Currently in Pain?  Yes    Pain Score  1    0.5/10   Pain Onset  More than a month ago                               PT Education - 12/19/18 1707    Education Details  ther-ex    Person(s) Educated  Patient    Methods  Explanation;Demonstration;Tactile cues;Verbal cues    Comprehension  Returned demonstration;Verbalized understanding           Objectives  MedbridgeAccess Code: [email or web address]  Manual therapy:  Seated STM L peroneal muscles and lateral gastroc muscle    Therapeutic Exercise:  NuStep seat 6, arms 6 for 5 minutes level 1 to promote movement, decrease stiffness, improve blood flow.           L anterior ankle and plantar ankle ache  Seated B ankle DF/PF on rocker board 2 minutes   Then L ankle IV/EV on rocker board 2 minutes  Seated manually resisted L ankle IV 10x5 seconds for 3 sets    Seated manually resisted L ankle DF 10x3 with 5 second holds   Seated L hip ER with yellow band resistance 10x2   Supine manually resisted L LE leg press, L ankle in DF and PT hands on heel  10x3  Standing mini squat with B UE assist 10x3, cues for femoral control            Improved exercise technique, movement at target joints, use of target muscles after min to mod verbal, visual, tactile cues.     Response to treatment  No plantar foot pain after session. L anterior ankle joint ache after session. Pt was recommended to continue her DF stretches to help promote posterior glide of talar bone and help decrease anterior pressure to her talocrural joint.    Clinical impression Continued working on decreasing lateral gastroc and peroneal muscle tension to help decrease pronation as well as peroneal tension to L cuboid bone. Worked on improving L ankle DF and IV strength as well for the same purpose. Continued working on glute strengthening and femoral control to help decrease L foot pronation in standing and walking and therefore less stress to the plantar structures of her L foot. No plantar foot pain at end of session.  L anterior ankle joint ache after session. Pt was recommended to continue her DF stretches to help promote posterior glide of talar bone and help decrease anterior pressure to her talocrural joint. Pt will benefit from continued skilled physical therapy services to decrease L foot pain, improve  LE strength, function, and ability to ambulate while wearing a regular shoe more comfortably.       PT Short Term Goals - 11/21/18 1518      PT SHORT TERM GOAL #1   Title  Patient will report a worst pain of 3/10 on  VAS in left ankle to improve tolerance with ADLs and reduced symptoms with activities.     Baseline  2/10 with CAM boot on at worst for the past 7 days (10/16/2018)    Time  4    Period  Weeks    Status  Partially Met    Target Date  09/19/18      PT SHORT TERM GOAL #2   Title  Patient will increase BLE gross strength to 3+/5, no pain as to improve functional strength for independent gait, increased standing tolerance and increased ADL ability.    Time  4    Period  Weeks    Status  Achieved    Target Date  09/19/18        PT Long Term Goals - 11/21/18 1538      PT LONG TERM GOAL #1   Title  Patient will increase lower extremity functional scale to >60/80 to demonstrate improved functional mobility and increased tolerance with ADLs.     Baseline  56/80 (10/11/2018)    Time  4    Period  Weeks    Status  On-going    Target Date  01/02/19      PT LONG TERM GOAL #2   Title  Patient will report a worst pain of 1/10 on VAS in  left ankle  to improve tolerance with ADLs and reduced symptoms with activities.     Baseline  2/10 L ankle pain with CAM boot on at most for the past 7 days (10/16/2018); 4/10 during 6MWT without boot on (11/21/18);     Time  6    Period  Weeks    Status  On-going    Target Date  01/02/19      PT LONG TERM GOAL #3   Title  Patient will increase six minute walk test distance to >2000 for progression to community ambulator and improve gait ability    Baseline  1325 ft (10/09/2018): 1300 ft (11/21/2018);     Time  6    Period  Weeks    Status  On-going    Target Date  01/02/19      PT LONG TERM GOAL #4   Title  Complete community, work and/or recreational activities without limitation due to current condition.     Baseline  has not yet come out  of cam boot, returned to full duty work (11/21/2018);     Time  6    Period  Weeks    Status  New    Target Date  01/02/19            Plan - 12/19/18 1657    Clinical Impression Statement  Continued working on decreasing lateral gastroc and peroneal muscle tension to help decrease pronation as well as peroneal tension to L cuboid bone. Worked on improving L ankle DF and IV strength as well for the same purpose. Continued working on glute strengthening and femoral control to help decrease L foot pronation in standing and walking and therefore less stress to the plantar structures of her L foot. No plantar foot pain at end of session.  L anterior ankle joint ache after session. Pt was recommended to continue her DF stretches to help promote posterior glide of talar bone and help decrease anterior pressure to her talocrural joint. Pt will benefit from continued skilled physical therapy services to decrease L foot pain, improve LE strength, function, and ability to ambulate while wearing a regular shoe more comfortably.  Personal Factors and Comorbidities  Time since onset of injury/illness/exacerbation;Fitness    Examination-Activity Limitations  Squat;Locomotion Level;Stairs    Stability/Clinical Decision Making  Stable/Uncomplicated    Rehab Potential  Fair    PT Frequency  2x / week    PT Duration  6 weeks    PT Treatment/Interventions  Electrical Stimulation;Aquatic Therapy;Cryotherapy;Moist Heat;Ultrasound;Gait training;Therapeutic activities;Therapeutic exercise;Patient/family education;Neuromuscular re-education;Manual techniques;Passive range of motion;Dry needling;Splinting;Taping    PT Next Visit Plan  [place], e-stim, strengthening left ankle, manual techniques    PT Home Exercise Plan  consult referring physician about when to wean out of cam boot/ update precautions.     Consulted and Agree with Plan of Care  Patient       Patient will benefit from skilled therapeutic intervention  in order to improve the following deficits and impairments:  Abnormal gait, Decreased endurance, Decreased mobility, Difficulty walking, Obesity, Decreased activity tolerance, Decreased strength, Impaired flexibility  Visit Diagnosis: Muscle weakness (generalized)  Difficulty in walking, not elsewhere classified     Problem List Patient Active Problem List   Diagnosis Date Noted  . 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 OSA on CPAP 05/15/2018  . Non-alcoholic fatty liver disease 03/06/2017  . Rosacea   . GERD (gastroesophageal reflux disease)   . Central serous retinopathy   . IBS (irritable bowel syndrome)   . Hyperlipidemia, mixed 01/24/2017  . Dyspnea on effort 12/22/2016  . Anxiety, generalized 04/29/2016  . Diabetes mellitus without complication ([place]) 83/01/4599  . Cancer of the skin, basal cell 03/12/2016  . Contact dermatitis 04/23/2013  . Nodule, subcutaneous 07/24/2012  . Localized swelling, mass and lump, unspecified 07/24/2012  . Hypertension 12/20/2011  . Personal history of breast cancer 12/20/2011  . Obesity 12/20/2011   Joneen Boers PT, DPT   12/19/2018, 6:18 PM  [facility] [address], [place], [address] Phone: [phone]   Fax:  [phone]  Name: Jacqueline Deleon MRN: [id number] Date of Birth: 06-14-1961

## 2018-12-20 DIAGNOSIS — G4733 Obstructive sleep apnea (adult) (pediatric): Secondary | ICD-10-CM | POA: Diagnosis not present

## 2018-12-21 ENCOUNTER — Other Ambulatory Visit: Payer: Self-pay

## 2018-12-21 ENCOUNTER — Ambulatory Visit: Payer: 59

## 2018-12-21 DIAGNOSIS — M6281 Muscle weakness (generalized): Secondary | ICD-10-CM | POA: Diagnosis not present

## 2018-12-21 DIAGNOSIS — R262 Difficulty in walking, not elsewhere classified: Secondary | ICD-10-CM | POA: Diagnosis not present

## 2018-12-21 NOTE — Therapy (Signed)
[facility] [address] [place], [address] Phone: [phone]   Fax:  [phone]  Physical Therapy Treatment  Patient Details  Name: Jacqueline Deleon MRN: [id number] Date of Birth: 1961/04/03 Referring Provider (PT): [place], [place] T, [place]   Encounter Date: 12/21/2018  PT End of Session - 12/21/18 1601    Visit Number  27    Number of Visits  37    Date for PT Re-Evaluation  01/02/19    Authorization Type  [employer] reporting period from 11/21/2018    Authorization - Visit Number  10    Authorization - Number of Visits  10    PT Start Time  1602    PT Stop Time  1654    PT Time Calculation (min)  52 min    Activity Tolerance  Patient limited by pain    Behavior During Therapy  [facility] for tasks assessed/performed       Past Medical History:  Diagnosis Date  . Arthritis   . 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 Breast mass 08/2006, 03/2009   left breast biopsy fibroadenoma (2008) right breast biopsy benign (2010)  . 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 Central serous retinopathy   . Fatty liver   . GERD (gastroesophageal reflux disease)   . Gestational diabetes   . Headache    migraines  . Heart murmur   . History of abnormal mammogram 08/2006, 01/2008, 03/2009  . Hypertension   . IBS (irritable bowel syndrome)   . Joint disorder    hx of left ankle tendonitis, bursitis, heel spur  . Obesity 2018   BMI 45  . Pelvic pain    adhesions and adenomyosis  . Rosacea     Past Surgical History:  Procedure Laterality Date  . BREAST BIOPSY Left 08/2006   benign fibroadenoma  . BREAST EXCISIONAL BIOPSY Right 02/26/2008   right breast invasive mam ca with rad partial mastectomy   . BREAST SURGERY Right    x2/ Dr Tamala Julian  . CARDIAC CATHETERIZATION  2006  . CESAREAN SECTION  04/02/1998  . CHOLECYSTECTOMY  04/2010   Dr. Smith/ laprascopic surgery   . COLONOSCOPY  04/04/2000  . ESOPHAGOGASTRODUODENOSCOPY (EGD) WITH PROPOFOL N/A 05/01/2015   Procedure: ESOPHAGOGASTRODUODENOSCOPY (EGD) WITH PROPOFOL;  Surgeon: Hulen Luster, MD;  Location: [facility] ENDOSCOPY;  Service: Gastroenterology;  Laterality: N/A;  . EYE SURGERY  08/2012   laser surgery on retina/ DR Appenzeller  . FINGER SURGERY     repair of tendon in right index, Dr. Francesco Sor in [place]  . FOOT SURGERY     Dr. Milinda Pointer  . IRRIGATION AND DEBRIDEMENT SEBACEOUS CYST     right upper back  . LAPAROSCOPIC SUPRACERVICAL HYSTERECTOMY  06/2007   Dr Kincius/ adhesions/CPP/adenomyosis  . MASTECTOMY PARTIAL / LUMPECTOMY Right 01/2008   with sentinal lymph node biopsy/ infiltrating ductal cancer  . SKIN CANCER EXCISION     back and calves  . WISDOM TOOTH EXTRACTION  1991    There were no vitals filed for this visit.  Subjective Assessment - 12/21/18 1604    Subjective  L foot is pretty good. Just a little achy around the top of the ankle. Still feels L lateral plantar foot pain now and again when she walks. 0.5/10 L lateral plantar foot pain when walking from waiting room to the treatment room.  Has not worn the CAM boot today. Has not taken any pain medicine for her foot in a while. 1/10 L anterior ankle ache.    Pertinent History   Patient reports 07/19/2018 after having stepped hard on her left foot  not using the cam walker using her regular tennis shoes she had a sharp pain around the cuboid and radiating up her leg.  Radiographs show a  tear in the peroneus longus tendon.  She has tenderness on palpation of the cuboid    How long can you walk comfortably?  5-10 mins standing and waking (with boot donned)    Patient Stated Goals  to walk and stand without pain.     Currently in Pain?  Yes    Pain Score  1    1/10 anterior ankle ache   Pain Location  Ankle    Pain Orientation  Left    Pain Descriptors / Indicators  Aching    Pain Onset  More than a month ago                                PT Education - 12/21/18 1607    Education Details  ther-ex    Person(s) Educated  Patient    Methods  Explanation;Demonstration;Tactile cues;Verbal cues    Comprehension  Returned demonstration;Verbalized understanding         Objectives  MedbridgeAccess Code: [email or web address]  Manual therapy:  Seated STM L peroneal muscles and lateral gastroc muscle  Seated manual mid foot curving with P to A pressure to plantar surface  Seated manual L foot IV with P to A pressure to plantar surface   No L foot pain with gait after manual therapy.      Therapeutic Exercise:   Seated manually resisted L ankle IV 10x5 seconds for 3 sets    Seated manually resisted L ankle DF 10x3 with 5 second holds   Supine manually resisted L LE leg press, L ankle in DF and PT hands on heel             10x3  Seated L hip ER with yellow band resistance 10x2   Standing mini squat with B UE assist 10x3, cues for femoral control  Seated B ankle DF/PF on rocker board 2 minutes   Then L ankle IV/EV on rocker board 2 minutes  Slight increase in L lateral plantar foot ache. No ache after manual therapy  Seated L toe flexion gentle manually resisted isometrics with L ankle in IV 10x5 seconds    Improved exercise technique, movement at target joints, use of target muscles after min to mod verbal, visual, tactile cues.     Response to treatment No anterior ankle or lateral plantar foot pain after manual therapy.    Clinical impression Continued working on L ankle DF and IV strength, glute strength as well as decreasing gastroc and peroneal muscle tension to help decrease pressure to plantar foot. Also worked on manual therapy to promote gentle supination of cuboid (no fracture in foot per MRI on 05/28/2019) which decreased foot pain and ache. No pain or ache on L foot with standing and walking at end of session. Pt will  benefit from continued skilled physical therapy services to decrease pain, improve LE strength, function, and decrease difficulty ambulating.         PT Short Term Goals - 11/21/18 1518      PT SHORT TERM  GOAL #1   Title  Patient will report a worst pain of 3/10 on VAS in left ankle to improve tolerance with ADLs and reduced symptoms with activities.     Baseline  2/10 with CAM boot on at worst for the past 7 days (10/16/2018)    Time  4    Period  Weeks    Status  Partially Met    Target Date  09/19/18      PT SHORT TERM GOAL #2   Title  Patient will increase BLE gross strength to 3+/5, no pain as to improve functional strength for independent gait, increased standing tolerance and increased ADL ability.    Time  4    Period  Weeks    Status  Achieved    Target Date  09/19/18        PT Long Term Goals - 11/21/18 1538      PT LONG TERM GOAL #1   Title  Patient will increase lower extremity functional scale to >60/80 to demonstrate improved functional mobility and increased tolerance with ADLs.     Baseline  56/80 (10/11/2018)    Time  4    Period  Weeks    Status  On-going    Target Date  01/02/19      PT LONG TERM GOAL #2   Title  Patient will report a worst pain of 1/10 on VAS in  left ankle  to improve tolerance with ADLs and reduced symptoms with activities.     Baseline  2/10 L ankle pain with CAM boot on at most for the past 7 days (10/16/2018); 4/10 during 6MWT without boot on (11/21/18);     Time  6    Period  Weeks    Status  On-going    Target Date  01/02/19      PT LONG TERM GOAL #3   Title  Patient will increase six minute walk test distance to >2000 for progression to community ambulator and improve gait ability    Baseline  1325 ft (10/09/2018): 1300 ft (11/21/2018);     Time  6    Period  Weeks    Status  On-going    Target Date  01/02/19      PT LONG TERM GOAL #4   Title  Complete community, work and/or recreational activities without limitation due to  current condition.     Baseline  has not yet come out of cam boot, returned to full duty work (11/21/2018);     Time  6    Period  Weeks    Status  New    Target Date  01/02/19            Plan - 12/21/18 1714    Clinical Impression Statement  Continued working on L ankle DF and IV strength, glute strength as well as decreasing gastroc and peroneal muscle tension to help decrease pressure to plantar foot. Also worked on manual therapy to promote gentle supination of cuboid (no fracture in foot per MRI on 05/28/2019) which decreased foot pain and ache. No pain or ache on L foot with standing and walking at end of session. Pt will benefit from continued skilled physical therapy services to decrease pain, improve LE strength, function, and decrease difficulty ambulating.    Personal Factors and Comorbidities  Time since onset of injury/illness/exacerbation;Fitness    Examination-Activity Limitations  Squat;Locomotion Level;Stairs    Stability/Clinical Decision Making  Stable/Uncomplicated    Rehab Potential  Fair  PT Frequency  2x / week    PT Duration  6 weeks    PT Treatment/Interventions  Electrical Stimulation;Aquatic Therapy;Cryotherapy;Moist Heat;Ultrasound;Gait training;Therapeutic activities;Therapeutic exercise;Patient/family education;Neuromuscular re-education;Manual techniques;Passive range of motion;Dry needling;Splinting;Taping    PT Next Visit Plan  [place], e-stim, strengthening left ankle, manual techniques    PT Home Exercise Plan  consult referring physician about when to wean out of cam boot/ update precautions.     Consulted and Agree with Plan of Care  Patient       Patient will benefit from skilled therapeutic intervention in order to improve the following deficits and impairments:  Abnormal gait, Decreased endurance, Decreased mobility, Difficulty walking, Obesity, Decreased activity tolerance, Decreased strength, Impaired flexibility  Visit Diagnosis: Difficulty in  walking, not elsewhere classified - Plan:   Muscle weakness (generalized) - Plan:     Problem List Patient Active Problem List   Diagnosis Date Noted  . 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 OSA on CPAP 05/15/2018  . Non-alcoholic fatty liver disease 03/06/2017  . Rosacea   . GERD (gastroesophageal reflux disease)   . Central serous retinopathy   . IBS (irritable bowel syndrome)   . Hyperlipidemia, mixed 01/24/2017  . Dyspnea on effort 12/22/2016  . Anxiety, generalized 04/29/2016  . Diabetes mellitus without complication ([place]) 63/07/6008  . Cancer of the skin, basal cell 03/12/2016  . Contact dermatitis 04/23/2013  . Nodule, subcutaneous 07/24/2012  . Localized swelling, mass and lump, unspecified 07/24/2012  . Hypertension 12/20/2011  . Personal history of breast cancer 12/20/2011  . Obesity 12/20/2011    Joneen Boers PT, DPT   12/21/2018, 5:17 PM  [facility] [address] [place], [address] Phone: [phone]   Fax:  [phone]  Name: ALLYSIA INGLES MRN: [id number] Date of Birth: [birth year]

## 2018-12-23 DIAGNOSIS — Z853 Personal history of malignant neoplasm of breast: Secondary | ICD-10-CM | POA: Diagnosis not present

## 2018-12-25 ENCOUNTER — Ambulatory Visit: Payer: 59

## 2018-12-25 ENCOUNTER — Other Ambulatory Visit: Payer: Self-pay

## 2018-12-25 DIAGNOSIS — R262 Difficulty in walking, not elsewhere classified: Secondary | ICD-10-CM | POA: Diagnosis not present

## 2018-12-25 DIAGNOSIS — M6281 Muscle weakness (generalized): Secondary | ICD-10-CM

## 2018-12-25 NOTE — Therapy (Signed)
[facility] [address], [place], [address] Phone: [phone]   Fax:  [phone]  Physical Therapy Treatment  Patient Details  Name: Jacqueline Deleon MRN: [id number] Date of Birth: 1961-01-16 Referring Provider (PT): [place], [place], [place]   Encounter Date: 12/25/2018  PT End of Session - 12/25/18 1702    Visit Number  28    Number of Visits  37    Date for PT Re-Evaluation  01/02/19    Authorization Type  [employer] reporting period from 11/21/2018    Authorization - Visit Number  11    PT Start Time  1703    PT Stop Time  7341    PT Time Calculation (min)  45 min    Activity Tolerance  Patient limited by pain    Behavior During Therapy  [facility] for tasks assessed/performed       Past Medical History:  Diagnosis Date  . Arthritis   . 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 Breast mass 08/2006, 03/2009   left breast biopsy fibroadenoma (2008) right breast biopsy benign (2010)  . 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 Central serous retinopathy   . Fatty liver   . GERD (gastroesophageal reflux disease)   . Gestational diabetes   . Headache    migraines  . Heart murmur   . History of abnormal mammogram 08/2006, 01/2008, 03/2009  . Hypertension   . IBS (irritable bowel syndrome)   . Joint disorder    hx of left ankle tendonitis, bursitis, heel spur  . Obesity 2018   BMI 45  . Pelvic pain    adhesions and adenomyosis  . Rosacea     Past Surgical History:  Procedure Laterality Date  . BREAST BIOPSY Left 08/2006   benign fibroadenoma  . BREAST EXCISIONAL BIOPSY Right 02/26/2008   right breast invasive mam ca with rad partial mastectomy   . BREAST SURGERY Right    x2/ Dr Tamala Julian  . CARDIAC CATHETERIZATION  2006  . CESAREAN SECTION  04/02/1998  . CHOLECYSTECTOMY  04/2010   Dr. Smith/ laprascopic surgery  . COLONOSCOPY  04/04/2000  .  ESOPHAGOGASTRODUODENOSCOPY (EGD) WITH PROPOFOL N/A 05/01/2015   Procedure: ESOPHAGOGASTRODUODENOSCOPY (EGD) WITH PROPOFOL;  Surgeon: Hulen Luster, MD;  Location: [facility] ENDOSCOPY;  Service: Gastroenterology;  Laterality: N/A;  . EYE SURGERY  08/2012   laser surgery on retina/ DR Appenzeller  . FINGER SURGERY     repair of tendon in right index, Dr. Francesco Sor in [place]  . FOOT SURGERY     Dr. Milinda Pointer  . IRRIGATION AND DEBRIDEMENT SEBACEOUS CYST     right upper back  . LAPAROSCOPIC SUPRACERVICAL HYSTERECTOMY  06/2007   Dr Kincius/ adhesions/CPP/adenomyosis  . MASTECTOMY PARTIAL / LUMPECTOMY Right 01/2008   with sentinal lymph node biopsy/ infiltrating ductal cancer  . SKIN CANCER EXCISION     back and calves  . WISDOM TOOTH EXTRACTION  1991    There were no vitals filed for this visit.  Subjective Assessment - 12/25/18 1704    Subjective  L foot is not too good. Bothered her pretty much since she left last session. Had to do a lot of walking this morning because she had to renew her [place]. 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 Currenlty has 3690 steps today according to her fit bit watch. 4/10 currently.  5/10 at most today (L lateral plantar foot and anterior ankle)    Pertinent History   Patient reports 07/19/2018 after having stepped hard on her left foot  not using the cam walker using her regular tennis shoes she had a sharp pain around the cuboid and radiating up her leg.  Radiographs show a  tear in the peroneus longus tendon.  She has tenderness on palpation of the cuboid    How long can you walk comfortably?  5-10 mins standing and waking (with boot donned)    Patient Stated Goals  to walk and stand without pain.     Currently in Pain?  Yes    Pain Score  4     Pain Onset  More than a month ago                               PT Education - 12/25/18 1757    Education Details  ther-ex    Person(s) Educated  Patient    Methods   Explanation;Demonstration;Tactile cues;Verbal cues    Comprehension  Returned demonstration;Verbalized understanding       Objectives  MedbridgeAccess Code: [email or web address]  Manual therapy:   Seated manual mid foot curving with P to A pressure to plantar surface  Seated manual L foot IV with P to A pressure to plantar surface    No change in lateral plantar symptoms              Seated STM L peroneal muscles and lateral gastroc muscle  Seated manual L ankle DF/PF  Felt good initially for pt.   Seated gentle ankle distraction   Felt good for anterior ankle  Seated gentle sustained posterior glide L ankle joint 5x5 seconds  Decreased anterior ankle ache       Therapeutic Exercise:   Seated manually resisted L ankle IV 10x5 seconds for 3 sets   Seated manually resisted L ankle DF 10x3   Seated L ankle PF manually resisted 10x3  Increased lateral plantar foot symptoms.   Gait x 60 ft after aforementioned treatment   4.5/10 L foot pain   Seated L ankle DF 10x5 seconds for 2 sets   Decreased L foot pain to a 3/10 with gait  Then heel walking forward 60 ft then backward 60 ft   Improved exercise technique, movement at target joints, use of target muscles after min verbal, visual, tactile cues.     Response to treatment Decreased L lateral plantar foot pain to 3/10 after session. Decreased L anterior ankle pain with gentle sustained posterior glide to L ankle joint.    Clinical impression Pt demonstrates overall increased L foot symptoms since last session. Increased anterior ankle symptoms with end range ankle DF. Increased L lateral plantar foot symptoms with plantar flexion movements. Continued working on increasing ankle IV and DF strength as well as promoting gentle movement around her foot and decreasing gastroc tightness to promote ankle DF with less anterior ankle discomfort. Decreased L foot pain to 3/10 at end of session. Pt will benefit  from continued skilled physical therapy services to decrease pain, improve strength, function, and decrease difficulty ambulating.          PT Short Term Goals - 11/21/18 1518      PT SHORT TERM GOAL #1   Title  Patient will report a worst pain of 3/10 on VAS in left ankle to improve tolerance with ADLs and  reduced symptoms with activities.     Baseline  2/10 with CAM boot on at worst for the past 7 days (10/16/2018)    Time  4    Period  Weeks    Status  Partially Met    Target Date  09/19/18      PT SHORT TERM GOAL #2   Title  Patient will increase BLE gross strength to 3+/5, no pain as to improve functional strength for independent gait, increased standing tolerance and increased ADL ability.    Time  4    Period  Weeks    Status  Achieved    Target Date  09/19/18        PT Long Term Goals - 11/21/18 1538      PT LONG TERM GOAL #1   Title  Patient will increase lower extremity functional scale to >60/80 to demonstrate improved functional mobility and increased tolerance with ADLs.     Baseline  56/80 (10/11/2018)    Time  4    Period  Weeks    Status  On-going    Target Date  01/02/19      PT LONG TERM GOAL #2   Title  Patient will report a worst pain of 1/10 on VAS in  left ankle  to improve tolerance with ADLs and reduced symptoms with activities.     Baseline  2/10 L ankle pain with CAM boot on at most for the past 7 days (10/16/2018); 4/10 during 6MWT without boot on (11/21/18);     Time  6    Period  Weeks    Status  On-going    Target Date  01/02/19      PT LONG TERM GOAL #3   Title  Patient will increase six minute walk test distance to >2000 for progression to community ambulator and improve gait ability    Baseline  1325 ft (10/09/2018): 1300 ft (11/21/2018);     Time  6    Period  Weeks    Status  On-going    Target Date  01/02/19      PT LONG TERM GOAL #4   Title  Complete community, work and/or recreational activities without limitation due to current  condition.     Baseline  has not yet come out of cam boot, returned to full duty work (11/21/2018);     Time  6    Period  Weeks    Status  New    Target Date  01/02/19            Plan - 12/25/18 1804    Clinical Impression Statement  Pt demonstrates overall increased L foot symptoms since last session. Increased anterior ankle symptoms with end range ankle DF. Increased L lateral plantar foot symptoms with plantar flexion movements. Continued working on increasing ankle IV and DF strength as well as promoting gentle movement around her foot and decreasing gastroc tightness to promote ankle DF with less anterior ankle discomfort. Decreased L foot pain to 3/10 at end of session. Pt will benefit from continued skilled physical therapy services to decrease pain, improve strength, function, and decrease difficulty ambulating.    Personal Factors and Comorbidities  Time since onset of injury/illness/exacerbation;Fitness    Examination-Activity Limitations  Squat;Locomotion Level;Stairs    Stability/Clinical Decision Making  Stable/Uncomplicated    Rehab Potential  Fair    PT Frequency  2x / week    PT Duration  6 weeks    PT Treatment/Interventions  Electrical Stimulation;Aquatic Therapy;Cryotherapy;Moist Heat;Ultrasound;Gait training;Therapeutic activities;Therapeutic exercise;Patient/family education;Neuromuscular re-education;Manual techniques;Passive range of motion;Dry needling;Splinting;Taping    PT Next Visit Plan  [place], e-stim, strengthening left ankle, manual techniques    PT Home Exercise Plan  consult referring physician about when to wean out of cam boot/ update precautions.     Consulted and Agree with Plan of Care  Patient       Patient will benefit from skilled therapeutic intervention in order to improve the following deficits and impairments:  Abnormal gait, Decreased endurance, Decreased mobility, Difficulty walking, Obesity, Decreased activity tolerance, Decreased strength,  Impaired flexibility  Visit Diagnosis: 1. Difficulty in walking, not elsewhere classified   2. Muscle weakness (generalized)        Problem List Patient Active Problem List   Diagnosis Date Noted  . 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 OSA on CPAP 05/15/2018  . Non-alcoholic fatty liver disease 03/06/2017  . Rosacea   . GERD (gastroesophageal reflux disease)   . Central serous retinopathy   . IBS (irritable bowel syndrome)   . Hyperlipidemia, mixed 01/24/2017  . Dyspnea on effort 12/22/2016  . Anxiety, generalized 04/29/2016  . Diabetes mellitus without complication ([place]) 88/91/6945  . Cancer of the skin, basal cell 03/12/2016  . Contact dermatitis 04/23/2013  . Nodule, subcutaneous 07/24/2012  . Localized swelling, mass and lump, unspecified 07/24/2012  . Hypertension 12/20/2011  . Personal history of breast cancer 12/20/2011  . Obesity 12/20/2011    Joneen Boers PT, DPT   12/25/2018, 6:10 PM  [facility] [address], [place], [address] Phone: [phone]   Fax:  [phone]  Name: TIOMBE TOMEO MRN: [id number] Date of Birth: 09/18/1960

## 2018-12-26 DIAGNOSIS — R51 Headache: Secondary | ICD-10-CM | POA: Diagnosis not present

## 2018-12-26 DIAGNOSIS — M542 Cervicalgia: Secondary | ICD-10-CM | POA: Diagnosis not present

## 2018-12-26 DIAGNOSIS — M9901 Segmental and somatic dysfunction of cervical region: Secondary | ICD-10-CM | POA: Diagnosis not present

## 2018-12-26 DIAGNOSIS — M9902 Segmental and somatic dysfunction of thoracic region: Secondary | ICD-10-CM | POA: Diagnosis not present

## 2018-12-26 DIAGNOSIS — M9903 Segmental and somatic dysfunction of lumbar region: Secondary | ICD-10-CM | POA: Diagnosis not present

## 2018-12-26 DIAGNOSIS — M545 Low back pain: Secondary | ICD-10-CM | POA: Diagnosis not present

## 2018-12-26 DIAGNOSIS — M9904 Segmental and somatic dysfunction of sacral region: Secondary | ICD-10-CM | POA: Diagnosis not present

## 2018-12-26 DIAGNOSIS — M5412 Radiculopathy, cervical region: Secondary | ICD-10-CM | POA: Diagnosis not present

## 2018-12-26 DIAGNOSIS — M6080 Other myositis, unspecified site: Secondary | ICD-10-CM | POA: Diagnosis not present

## 2018-12-28 ENCOUNTER — Ambulatory Visit: Payer: 59

## 2018-12-28 ENCOUNTER — Other Ambulatory Visit: Payer: Self-pay

## 2018-12-28 DIAGNOSIS — M6281 Muscle weakness (generalized): Secondary | ICD-10-CM | POA: Diagnosis not present

## 2018-12-28 DIAGNOSIS — R262 Difficulty in walking, not elsewhere classified: Secondary | ICD-10-CM | POA: Diagnosis not present

## 2018-12-28 NOTE — Therapy (Signed)
[place] [facility] [facility] [address], [place], [address] Phone: [phone]   Fax:  [phone]  Physical Therapy Treatment  Patient Details  Name: Jacqueline Deleon MRN: [id number] Date of Birth: 07/20/1960 Referring Provider (PT): [place], [place] T, [place]   Encounter Date: 12/28/2018  PT End of Session - 12/28/18 1706    Visit Number  29    Number of Visits  37    Date for PT Re-Evaluation  01/02/19    Authorization Type  [employer] reporting period from 11/21/2018    Authorization - Visit Number  12    PT Start Time  1706    PT Stop Time  1746    PT Time Calculation (min)  40 min    Activity Tolerance  Patient limited by pain    Behavior During Therapy  [facility] for tasks assessed/performed       Past Medical History:  Diagnosis Date  . Arthritis   . 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 Breast mass 08/2006, 03/2009   left breast biopsy fibroadenoma (2008) right breast biopsy benign (2010)  . 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 Central serous retinopathy   . Fatty liver   . GERD (gastroesophageal reflux disease)   . Gestational diabetes   . Headache    migraines  . Heart murmur   . History of abnormal mammogram 08/2006, 01/2008, 03/2009  . Hypertension   . IBS (irritable bowel syndrome)   . Joint disorder    hx of left ankle tendonitis, bursitis, heel spur  . Obesity 2018   BMI 45  . Pelvic pain    adhesions and adenomyosis  . Rosacea     Past Surgical History:  Procedure Laterality Date  . BREAST BIOPSY Left 08/2006   benign fibroadenoma  . BREAST EXCISIONAL BIOPSY Right 02/26/2008   right breast invasive mam ca with rad partial mastectomy   . BREAST SURGERY Right    x2/ Dr Tamala Julian  . CARDIAC CATHETERIZATION  2006  . CESAREAN SECTION  04/02/1998  . CHOLECYSTECTOMY  04/2010   Dr. Smith/ laprascopic surgery  . COLONOSCOPY  04/04/2000  .  ESOPHAGOGASTRODUODENOSCOPY (EGD) WITH PROPOFOL N/A 05/01/2015   Procedure: ESOPHAGOGASTRODUODENOSCOPY (EGD) WITH PROPOFOL;  Surgeon: Hulen Luster, MD;  Location: [facility] ENDOSCOPY;  Service: Gastroenterology;  Laterality: N/A;  . EYE SURGERY  08/2012   laser surgery on retina/ DR Appenzeller  . FINGER SURGERY     repair of tendon in right index, Dr. Francesco Sor in [place]  . FOOT SURGERY     Dr. Milinda Pointer  . IRRIGATION AND DEBRIDEMENT SEBACEOUS CYST     right upper back  . LAPAROSCOPIC SUPRACERVICAL HYSTERECTOMY  06/2007   Dr Kincius/ adhesions/CPP/adenomyosis  . MASTECTOMY PARTIAL / LUMPECTOMY Right 01/2008   with sentinal lymph node biopsy/ infiltrating ductal cancer  . SKIN CANCER EXCISION     back and calves  . WISDOM TOOTH EXTRACTION  1991    There were no vitals filed for this visit.  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 L foot is pretty good today. No pain currently when walking. L foot hurt after last session. Hot water helped her foot. Was up more than usual at work. All and all, her foot did good. Did not take anything for pain.  Pertinent History   Patient reports 07/19/2018 after having stepped hard on her left foot  not using the cam walker using her regular tennis shoes she had a sharp pain around the cuboid and radiating up her leg.  Radiographs show a  tear in the peroneus longus tendon.  She has tenderness on palpation of the cuboid    How long can you walk comfortably?  5-10 mins standing and waking (with boot donned)    Patient Stated Goals  to walk and stand without pain.     Currently in Pain?  No/denies    Pain Score  0-No pain    Pain Onset  More than a month ago                               PT Education - 12/28/18 1731    Education Details  ther-ex    Person(s) Educated  Patient    Methods  Explanation;Demonstration;Tactile cues;Verbal cues    Comprehension  Returned  demonstration;Verbalized understanding         Objectives  MedbridgeAccess Code: [email or web address]  Has an appointment with Dr. Hyatt January 29, 2019.      Manual therapy:  Seated STM L gastroc and peroneal muscles     try L foot pronation manual therapy next visit if appropriate   Therapeutic Exercise:   Seated manually resisted L ankle IV 10x5 seconds for 3 sets   Seated manually resisted L ankle DF 10x3 with 5 second holds  Seated L ankle PF manually resisted 10x3  Seated L ankle EV manually resisted 10x3           Running man L LE, on L heel (to decrease pressure to plantar surface) and R UE assist 10x2, then 8x to promote glute muscle strengthening. Cues for femoral control.   L heel ache.   Seated L hip ER with manual resistance 10x3  Standing hip machine height 1   L hip extension plate 40 for 10x3      Improved exercise technique, movement at target joints, use of target muscles after min verbal, visual, tactile cues.     Response to treatment Good muscle use felt with exercises. L plantar foot pain with ankle PF and EV movements. Slight L plantar foot discomfort with gait after session.    Clinical impression Continued working on decreasing L gastroc and peroneal muscle tension, improving L ankle strength as well as L glute strength to help decrease foot pronation during stance phase and help decrease foot pain during gait. Pt will benefit from continued skilled physical therapy services to decrease pain, improve L LE strength, function, and decrease difficulty ambulating.       PT Short Term Goals - 11/21/18 1518      PT SHORT TERM GOAL #1   Title  Patient will report a worst pain of 3/10 on VAS in left ankle to improve tolerance with ADLs and reduced symptoms with activities.     Baseline  2/10 with CAM boot on at worst for the past 7 days (10/16/2018)    Time  4    Period  Weeks    Status  Partially Met    Target Date   09/19/18      PT SHORT TERM GOAL #2   Title  Patient will increase BLE gross strength to 3+/5, no pain as to improve functional strength for independent gait, increased standing tolerance and increased ADL   ability.    Time  4    Period  Weeks    Status  Achieved    Target Date  09/19/18        PT Long Term Goals - 11/21/18 1538      PT LONG TERM GOAL #1   Title  Patient will increase lower extremity functional scale to >60/80 to demonstrate improved functional mobility and increased tolerance with ADLs.     Baseline  56/80 (10/11/2018)    Time  4    Period  Weeks    Status  On-going    Target Date  01/02/19      PT LONG TERM GOAL #2   Title  Patient will report a worst pain of 1/10 on VAS in  left ankle  to improve tolerance with ADLs and reduced symptoms with activities.     Baseline  2/10 L ankle pain with CAM boot on at most for the past 7 days (10/16/2018); 4/10 during 6MWT without boot on (11/21/18);     Time  6    Period  Weeks    Status  On-going    Target Date  01/02/19      PT LONG TERM GOAL #3   Title  Patient will increase six minute walk test distance to >2000 for progression to community ambulator and improve gait ability    Baseline  1325 ft (10/09/2018): 1300 ft (11/21/2018);     Time  6    Period  Weeks    Status  On-going    Target Date  01/02/19      PT LONG TERM GOAL #4   Title  Complete community, work and/or recreational activities without limitation due to current condition.     Baseline  has not yet come out of cam boot, returned to full duty work (11/21/2018);     Time  6    Period  Weeks    Status  New    Target Date  01/02/19            Plan - 12/28/18 1706    Clinical Impression Statement  Continued working on decreasing L gastroc and peroneal muscle tension, improving L ankle strength as well as L glute strength to help decrease foot pronation during stance phase and help decrease foot pain during gait. Pt will benefit from continued skilled  physical therapy services to decrease pain, improve L LE strength, function, and decrease difficulty ambulating.    Personal Factors and Comorbidities  Time since onset of injury/illness/exacerbation;Fitness    Examination-Activity Limitations  Squat;Locomotion Level;Stairs    Stability/Clinical Decision Making  Stable/Uncomplicated    Rehab Potential  Fair    PT Frequency  2x / week    PT Duration  6 weeks    PT Treatment/Interventions  Electrical Stimulation;Aquatic Therapy;Cryotherapy;Moist Heat;Ultrasound;Gait training;Therapeutic activities;Therapeutic exercise;Patient/family education;Neuromuscular re-education;Manual techniques;Passive range of motion;Dry needling;Splinting;Taping    PT Next Visit Plan  US, e-stim, strengthening left ankle, manual techniques    PT Home Exercise Plan  consult referring physician about when to wean out of cam boot/ update precautions.     Consulted and Agree with Plan of Care  Patient       Patient will benefit from skilled therapeutic intervention in order to improve the following deficits and impairments:  Abnormal gait, Decreased endurance, Decreased mobility, Difficulty walking, Obesity, Decreased activity tolerance, Decreased strength, Impaired flexibility  Visit Diagnosis: 1. Difficulty in walking, not elsewhere classified   2. Muscle weakness (generalized)          Problem List Patient Active Problem List   Diagnosis Date Noted  . 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 OSA on CPAP 05/15/2018  . Non-alcoholic fatty liver disease 03/06/2017  . Rosacea   . GERD (gastroesophageal reflux disease)   . Central serous retinopathy   . IBS (irritable bowel syndrome)   . Hyperlipidemia, mixed 01/24/2017  . Dyspnea on effort 12/22/2016  . Anxiety, generalized 04/29/2016  . Diabetes mellitus without complication ([place]) 35/36/1443  . Cancer of the skin, basal cell 03/12/2016  . Contact dermatitis 04/23/2013  . Nodule,  subcutaneous 07/24/2012  . Localized swelling, mass and lump, unspecified 07/24/2012  . Hypertension 12/20/2011  . Personal history of breast cancer 12/20/2011  . Obesity 12/20/2011    Joneen Boers PT, DPT   12/28/2018, 6:03 PM  [place] [facility] [address], [place], [address] Phone: [phone]   Fax:  [phone]  Name: Jacqueline Deleon MRN: [id number] Date of Birth: 12-25-1960

## 2019-01-02 ENCOUNTER — Other Ambulatory Visit: Payer: Self-pay

## 2019-01-02 ENCOUNTER — Ambulatory Visit: Payer: 59

## 2019-01-02 DIAGNOSIS — R262 Difficulty in walking, not elsewhere classified: Secondary | ICD-10-CM | POA: Diagnosis not present

## 2019-01-02 DIAGNOSIS — M6281 Muscle weakness (generalized): Secondary | ICD-10-CM | POA: Diagnosis not present

## 2019-01-02 NOTE — Therapy (Signed)
[place] [facility] [address], [place], [address] Phone: [phone]   Fax:  [phone]  Physical Therapy Treatment  Patient Details  Name: Jacqueline Deleon MRN: [id number] Date of Birth: 10-Jul-1961 Referring Provider (PT): [place], [place] T, [place]   Encounter Date: 01/02/2019  PT End of Session - 01/02/19 1704    Visit Number  30    Number of Visits  37    Date for PT Re-Evaluation  01/02/19    Authorization Type  [employer] reporting period from 11/21/2018    Authorization - Visit Number  --    PT Start Time  1704    PT Stop Time  1756    PT Time Calculation (min)  52 min    Activity Tolerance  Patient limited by pain    Behavior During Therapy  [facility] for tasks assessed/performed       Past Medical History:  Diagnosis Date  . Arthritis   . 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 Breast mass 08/2006, 03/2009   left breast biopsy fibroadenoma (2008) right breast biopsy benign (2010)  . 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 Central serous retinopathy   . Fatty liver   . GERD (gastroesophageal reflux disease)   . Gestational diabetes   . Headache    migraines  . Heart murmur   . History of abnormal mammogram 08/2006, 01/2008, 03/2009  . Hypertension   . IBS (irritable bowel syndrome)   . Joint disorder    hx of left ankle tendonitis, bursitis, heel spur  . Obesity 2018   BMI 45  . Pelvic pain    adhesions and adenomyosis  . Rosacea     Past Surgical History:  Procedure Laterality Date  . BREAST BIOPSY Left 08/2006   benign fibroadenoma  . BREAST EXCISIONAL BIOPSY Right 02/26/2008   right breast invasive mam ca with rad partial mastectomy   . BREAST SURGERY Right    x2/ Dr Tamala Julian  . CARDIAC CATHETERIZATION  2006  . CESAREAN SECTION  04/02/1998  . CHOLECYSTECTOMY  04/2010   Dr. Smith/ laprascopic surgery  . COLONOSCOPY  04/04/2000  .  ESOPHAGOGASTRODUODENOSCOPY (EGD) WITH PROPOFOL N/A 05/01/2015   Procedure: ESOPHAGOGASTRODUODENOSCOPY (EGD) WITH PROPOFOL;  Surgeon: Hulen Luster, MD;  Location: [facility] ENDOSCOPY;  Service: Gastroenterology;  Laterality: N/A;  . EYE SURGERY  08/2012   laser surgery on retina/ DR Appenzeller  . FINGER SURGERY     repair of tendon in right index, Dr. Francesco Sor in [place]  . FOOT SURGERY     Dr. Milinda Pointer  . IRRIGATION AND DEBRIDEMENT SEBACEOUS CYST     right upper back  . LAPAROSCOPIC SUPRACERVICAL HYSTERECTOMY  06/2007   Dr Kincius/ adhesions/CPP/adenomyosis  . MASTECTOMY PARTIAL / LUMPECTOMY Right 01/2008   with sentinal lymph node biopsy/ infiltrating ductal cancer  . SKIN CANCER EXCISION     back and calves  . WISDOM TOOTH EXTRACTION  1991    There were no vitals filed for this visit.  Subjective Assessment - 01/02/19 1706    Subjective  Had to be on the floor today because 3 nurses were out. L foot held up pretty good. Has not worn her CAM boot in 2 or more weeks. L foot pain did not get above a 2-3/10 pain today.  Walked over 3400 steps so far today (per fitbit). 2-3/10 L foot  pain at most for the past week without the CAM boot. Does not want to have surgery. Does not feel like she needs more in person PT. Wants to continue with HEP until her MD appointment. Will call back after the MD appointment 01/29/2019 to let clinic know if more PT is needed.    Pertinent History   Patient reports 07/19/2018 after having stepped hard on her left foot  not using the cam walker using her regular tennis shoes she had a sharp pain around the cuboid and radiating up her leg.  Radiographs show a  tear in the peroneus longus tendon.  She has tenderness on palpation of the cuboid    How long can you walk comfortably?  5-10 mins standing and waking (with boot donned)    Patient Stated Goals  to walk and stand without pain.     Currently in Pain?  No/denies    Pain Score  0-No pain   walking from waiting room to  treatment room.   Pain Onset  More than a month ago                               PT Education - 01/02/19 1728    Education Details  ther-ex, progress    Person(s) Educated  Patient    Methods  Explanation;Demonstration;Tactile cues;Verbal cues    Comprehension  Returned demonstration;Verbalized understanding      Objectives  MedbridgeAccess Code: [email or web address]  Has an appointment with Dr. Milinda Pointer January 29, 2019.     Manual therapy:   Seated STM to L plantar foot Seated L foot supination with P to A pressure Seated STM L gastroc and peroneal muscles     Therapeutic Exercise:  Reviewed progress/current status with PT towards goals   Gait x 6 minutes  1326 ft. L foot 3/10 after about 3 minutes.   Seated manually resisted L ankle IV 10x5 seconds for 3 sets   Seated manually resisted L ankle DF 10x3with 5 second holds  Seated L ankle PF manually resisted 10x3  Seated L ankle EV manually resisted 10x3   Improved exercise technique, movement at target joints, use of target muscles afterminverbal, visual, tactile cues.    Response to treatment Good muscle use felt with exercises. L plantar foot pain with ankle PF and EV movements. Slight L plantar foot discomfort with gait after session.    Clinical impression Pt demonstrates overall improved L foot pain with worst pain level being 2-3/10 for the past week. Pt also consistently demonstrates no L lateral ankle pain at peroneal nerve area. She also improved B LE strength, ability to ambulate without CAM boot (has not worn the CAM boot for the past 2 weeks per subjective), and overall improved function. Pt to continue with HEP to promote progress and will call back to schedule more PT sessions if needed after her next follow up with her doctor. Continued working on soft tissue techniques to decrease gastroc tightness and promote plantar tissue mobility. Continued  working on ankle strengthening to promote stability and decrease L foot pronation. Pt tolerated session well without aggravation of symptoms. Pt has made progress towards goals.      PT Short Term Goals - 01/02/19 1713      PT SHORT TERM GOAL #1   Title  Patient will report a worst pain of 3/10 on VAS in left ankle to improve tolerance with ADLs and reduced symptoms with  activities.     Baseline  2/10 with CAM boot on at worst for the past 7 days (10/16/2018); 2-3/10 L ankle pain at worst for the past week, no CAM boot (01/02/2019)    Time  4    Period  Weeks    Status  Achieved    Target Date  09/19/18      PT SHORT TERM GOAL #2   Title  Patient will increase BLE gross strength to 3+/5, no pain as to improve functional strength for independent gait, increased standing tolerance and increased ADL ability.    Time  4    Period  Weeks    Status  Achieved    Target Date  09/19/18        PT Long Term Goals - 01/02/19 1715      PT LONG TERM GOAL #1   Title  Patient will increase lower extremity functional scale to >60/80 to demonstrate improved functional mobility and increased tolerance with ADLs.     Baseline  56/80 (10/11/2018); 54/80 (01/02/2019)    Time  4    Period  Weeks    Status  On-going    Target Date  01/02/19      PT LONG TERM GOAL #2   Title  Patient will report a worst pain of 1/10 on VAS in  left ankle  to improve tolerance with ADLs and reduced symptoms with activities.     Baseline  2/10 L ankle pain with CAM boot on at most for the past 7 days (10/16/2018); 4/10 during 6MWT without boot on (11/21/18); 2-3/10 L ankle pain at worst for the past week without CAM boot (01/02/2019)    Time  6    Period  Weeks    Status  Partially Met    Target Date  01/02/19      PT LONG TERM GOAL #3   Title  Patient will increase six minute walk test distance to >2000 for progression to community ambulator and improve gait ability    Baseline  1325 ft (10/09/2018): 1300 ft (11/21/2018);  1326 ft (01/02/2019)    Time  6    Period  Weeks    Status  On-going    Target Date  01/02/19      PT LONG TERM GOAL #4   Title  Complete community, work and/or recreational activities without limitation due to current condition.     Baseline  has not yet come out of cam boot, returned to full duty work (11/21/2018); Able to work without CAM boot (01/02/2019)    Time  6    Period  Weeks    Status  Partially Met    Target Date  01/02/19            Plan - 01/02/19 1657    Clinical Impression Statement  Pt demonstrates overall improved L foot pain with worst pain level being 2-3/10 for the past week. Pt also consistently demonstrates no L lateral ankle pain at peroneal nerve area. She also improved B LE strength, ability to ambulate without CAM boot (has not worn the CAM boot for the past 2 weeks per subjective), and overall improved function. Pt to continue with HEP to promote progress and will call back to schedule more PT sessions if needed after her next follow up with her doctor. Continued working on soft tissue techniques to decrease gastroc tightness and promote plantar tissue mobility. Continued working on ankle strengthening to promote stability and decrease L foot  pronation. Pt tolerated session well without aggravation of symptoms. Pt has made progress towards goals.    Personal Factors and Comorbidities  Time since onset of injury/illness/exacerbation;Fitness    Examination-Activity Limitations  Squat;Locomotion Level;Stairs    Stability/Clinical Decision Making  Stable/Uncomplicated    Rehab Potential  Fair    PT Frequency  2x / week    PT Duration  6 weeks    PT Treatment/Interventions  Electrical Stimulation;Aquatic Therapy;Cryotherapy;Moist Heat;Ultrasound;Gait training;Therapeutic activities;Therapeutic exercise;Patient/family education;Neuromuscular re-education;Manual techniques;Passive range of motion;Dry needling;Splinting;Taping    PT Next Visit Plan  [place], e-stim,  strengthening left ankle, manual techniques    PT Home Exercise Plan  consult referring physician about when to wean out of cam boot/ update precautions.     Consulted and Agree with Plan of Care  Patient       Patient will benefit from skilled therapeutic intervention in order to improve the following deficits and impairments:  Abnormal gait, Decreased endurance, Decreased mobility, Difficulty walking, Obesity, Decreased activity tolerance, Decreased strength, Impaired flexibility  Visit Diagnosis: 1. Difficulty in walking, not elsewhere classified   2. Muscle weakness (generalized)        Problem List Patient Active Problem List   Diagnosis Date Noted  . 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 OSA on CPAP 05/15/2018  . Non-alcoholic fatty liver disease 03/06/2017  . Rosacea   . GERD (gastroesophageal reflux disease)   . Central serous retinopathy   . IBS (irritable bowel syndrome)   . Hyperlipidemia, mixed 01/24/2017  . Dyspnea on effort 12/22/2016  . Anxiety, generalized 04/29/2016  . Diabetes mellitus without complication ([place]) 27/25/3664  . Cancer of the skin, basal cell 03/12/2016  . Contact dermatitis 04/23/2013  . Nodule, subcutaneous 07/24/2012  . Localized swelling, mass and lump, unspecified 07/24/2012  . Hypertension 12/20/2011  . Personal history of breast cancer 12/20/2011  . Obesity 12/20/2011    Joneen Boers PT, DPT   01/02/2019, 6:13 PM  [place] [facility] [address] [place], [address] Phone: [phone]   Fax:  [phone]  Name: Jacqueline Deleon MRN: [id number] Date of Birth: 1960-08-19

## 2019-01-03 DIAGNOSIS — C50911 Malignant neoplasm of unspecified site of right female breast: Secondary | ICD-10-CM | POA: Diagnosis not present

## 2019-01-04 ENCOUNTER — Ambulatory Visit: Payer: 59

## 2019-01-09 ENCOUNTER — Other Ambulatory Visit: Payer: Self-pay | Admitting: Certified Nurse Midwife

## 2019-01-09 ENCOUNTER — Ambulatory Visit: Payer: 59

## 2019-01-09 DIAGNOSIS — I10 Essential (primary) hypertension: Secondary | ICD-10-CM

## 2019-01-09 DIAGNOSIS — E119 Type 2 diabetes mellitus without complications: Secondary | ICD-10-CM

## 2019-01-09 DIAGNOSIS — E782 Mixed hyperlipidemia: Secondary | ICD-10-CM

## 2019-01-09 DIAGNOSIS — K7460 Unspecified cirrhosis of liver: Secondary | ICD-10-CM

## 2019-01-09 DIAGNOSIS — Z79899 Other long term (current) drug therapy: Secondary | ICD-10-CM

## 2019-01-12 MED FILL — LISINOPRIL 10 MG TABLET: 10 | 90 days supply | Qty: 90 | Fill #1

## 2019-01-12 MED FILL — METFORMIN HCL ER 500 MG TB2: 500 | 30 days supply | Qty: 60 | Fill #1

## 2019-01-12 MED FILL — CITALOPRAM HBR 40 MG TABLET: 40 | 90 days supply | Qty: 90 | Fill #1

## 2019-01-15 MED FILL — AMLODIPINE BESYLATE 10 MG T: 10 | 90 days supply | Qty: 90 | Fill #0

## 2019-01-16 ENCOUNTER — Other Ambulatory Visit: Payer: 59

## 2019-01-16 ENCOUNTER — Ambulatory Visit
Admission: RE | Admit: 2019-01-16 | Discharge: 2019-01-16 | Disposition: A | Discharge status: Home / Self Care | Payer: 59 | Source: Ambulatory Visit | Attending: Gastroenterology | Admitting: Gastroenterology

## 2019-01-16 ENCOUNTER — Other Ambulatory Visit: Payer: Self-pay

## 2019-01-16 DIAGNOSIS — E119 Type 2 diabetes mellitus without complications: Secondary | ICD-10-CM

## 2019-01-16 DIAGNOSIS — E782 Mixed hyperlipidemia: Secondary | ICD-10-CM

## 2019-01-16 DIAGNOSIS — K7460 Unspecified cirrhosis of liver: Secondary | ICD-10-CM | POA: Insufficient documentation

## 2019-01-16 DIAGNOSIS — Z79899 Other long term (current) drug therapy: Secondary | ICD-10-CM

## 2019-01-16 DIAGNOSIS — I10 Essential (primary) hypertension: Secondary | ICD-10-CM

## 2019-01-17 LAB — CBC WITH DIFFERENTIAL/PLATELET
Basophils Absolute: 0 10*3/uL (ref 0.0–0.2)
Basos: 1 %
EOS (ABSOLUTE): 0.1 10*3/uL (ref 0.0–0.4)
Eos: 2 %
Hematocrit: 38.8 % (ref 34.0–46.6)
Hemoglobin: 12.1 g/dL (ref 11.1–15.9)
Immature Grans (Abs): 0 10*3/uL (ref 0.0–0.1)
Immature Granulocytes: 0 %
Lymphocytes Absolute: 1.5 10*3/uL (ref 0.7–3.1)
Lymphs: 20 %
MCH: 24.6 pg — ABNORMAL LOW (ref 26.6–33.0)
MCHC: 31.2 g/dL — ABNORMAL LOW (ref 31.5–35.7)
MCV: 79 fL (ref 79–97)
Monocytes Absolute: 0.7 10*3/uL (ref 0.1–0.9)
Monocytes: 10 %
Neutrophils Absolute: 4.8 10*3/uL (ref 1.4–7.0)
Neutrophils: 67 %
Platelets: 234 10*3/uL (ref 150–450)
RBC: 4.91 x10E6/uL (ref 3.77–5.28)
RDW: 16.1 % — ABNORMAL HIGH (ref 11.7–15.4)
WBC: 7.2 10*3/uL (ref 3.4–10.8)

## 2019-01-17 LAB — HEMOGLOBIN A1C
Est. average glucose Bld gHb Est-mCnc: 123 mg/dL
Hgb A1c MFr Bld: 5.9 % — ABNORMAL HIGH (ref 4.8–5.6)

## 2019-01-17 LAB — CMP14+EGFR
ALT: 19 IU/L (ref 0–32)
AST: 14 IU/L (ref 0–40)
Albumin/Globulin Ratio: 1.8 (ref 1.2–2.2)
Albumin: 4.2 g/dL (ref 3.8–4.9)
Alkaline Phosphatase: 134 IU/L — ABNORMAL HIGH (ref 39–117)
BUN/Creatinine Ratio: 20 (ref 9–23)
BUN: 16 mg/dL (ref 6–24)
Bilirubin Total: 0.2 mg/dL (ref 0.0–1.2)
CO2: 21 mmol/L (ref 20–29)
Calcium: 9.3 mg/dL (ref 8.7–10.2)
Chloride: 104 mmol/L (ref 96–106)
Creatinine, Ser: 0.8 mg/dL (ref 0.57–1.00)
GFR calc Af Amer: 96 mL/min/{1.73_m2} (ref >59)
GFR calc non Af Amer: 83 mL/min/{1.73_m2} (ref >59)
Globulin, Total: 2.4 g/dL (ref 1.5–4.5)
Glucose: 97 mg/dL (ref 65–99)
Potassium: 4.9 mmol/L (ref 3.5–5.2)
Sodium: 141 mmol/L (ref 134–144)
Total Protein: 6.6 g/dL (ref 6.0–8.5)

## 2019-01-17 LAB — LIPID PANEL
Chol/HDL Ratio: 3.3 ratio (ref 0.0–4.4)
Cholesterol, Total: 175 mg/dL (ref 100–199)
HDL: 53 mg/dL (ref >39)
LDL Calculated: 96 mg/dL (ref 0–99)
Triglycerides: 132 mg/dL (ref 0–149)
VLDL Cholesterol Cal: 26 mg/dL (ref 5–40)

## 2019-01-17 LAB — AFP TUMOR MARKER: AFP, Serum, Tumor Marker: 1.7 ng/mL (ref 0.0–8.3)

## 2019-01-17 LAB — TSH: TSH: 4.05 u[IU]/mL (ref 0.450–4.500)

## 2019-01-18 DIAGNOSIS — E119 Type 2 diabetes mellitus without complications: Secondary | ICD-10-CM | POA: Diagnosis not present

## 2019-01-18 DIAGNOSIS — G4733 Obstructive sleep apnea (adult) (pediatric): Secondary | ICD-10-CM | POA: Diagnosis not present

## 2019-01-18 DIAGNOSIS — I10 Essential (primary) hypertension: Secondary | ICD-10-CM | POA: Diagnosis not present

## 2019-01-18 MED FILL — CITALOPRAM HBR 10 MG TABLET: 10 | 90 days supply | Qty: 90 | Fill #0

## 2019-01-18 MED FILL — buPROPion HCL ER (XL) 300 M: 300 | 90 days supply | Qty: 90 | Fill #0

## 2019-01-21 NOTE — Progress Notes (Signed)
Gynecology Annual Exam  PCP: Derinda Late, MD  Chief Complaint:  Chief Complaint  Patient presents with  . Gynecologic Exam    No concerns    History of Present Illness:Jacqueline Deleon is a 58 year old Caucasian/White female , G 1 P 1 0 0 1 , s/p supracervical hysterctomy, who presents for an annual exam.    Has not had any further spotting since October 2016. History of spotting after her Tamoxifen was discontinued on Aug 11, 2013. She is a 11 year breast cancer survivor. Her medical history is also significant for obesity, esophageal reflux, hypertension, migraine headaches, rosacea, nonalcoholic fatty liver, prediabetes, depression, basal cell carcinoma (left leg) and central serous retinopathy.    .  Since her last annual GYN exam dated 07/27/2017, she has been getting treatment/PT for left foot pain due a tear in a tendon. Her pain has been decreasing and it looks like she may not need surgery.  States that her Celexa was decreased to 10 mgm and she was begun on Wellbutrin. She reports feeling better on the Wellbutrin.  She is sexually active. She does not have vaginal dryness. Denies hot flashes.   Her most recent pap smear was obtained 07/27/2017 and was NIL/negative HRHPV.  Her most recent bilateral mammogram obtained on 12/13/2017 was normal and revealed no significant changes. There is a positive history of breast cancer in her mother and more recently her maternal aunt.. Genetic testing has been done. She tested negative for BRCA 1 and negative for BRCA 2. There is no family history of ovarian cancer. The patient does do monthly self breast exams.  She had a colonoscopy in 2015 that was normal. Her next colonoscopy/ EGD is scheduled for 02/12/2019. Mother had colon cancer  A DEXA scan is not applicable for this patient.  The patient does not smoke.  The patient does not drink alcohol.  The patient does not use illegal drugs.  The patient has been going to PT for foot  exercises. The patient does get adequate calcium in her diet.  She had a recent cholesterol screen 04/2017 that was normal.     Review of Systems: Review of Systems  Constitutional: Negative for chills, fever, malaise/fatigue and weight loss.  HENT: Negative for congestion, sinus pain and sore throat.   Eyes: Positive for blurred vision. Negative for pain.  Respiratory: Negative for hemoptysis, shortness of breath and wheezing.   Cardiovascular: Negative for chest pain, palpitations and leg swelling.  Gastrointestinal: Positive for diarrhea. Negative for abdominal pain, blood in stool, heartburn, nausea and vomiting.  Genitourinary: Negative for dysuria, frequency, hematuria and urgency.  Musculoskeletal: Negative for back pain, joint pain and myalgias.  Skin: Negative for itching and rash.  Neurological: Positive for headaches. Negative for dizziness and tingling.  Endo/Heme/Allergies: Positive for environmental allergies. Negative for polydipsia. Does not bruise/bleed easily.       Negative for hirsutism   Psychiatric/Behavioral: Positive for depression. The patient is nervous/anxious. The patient does not have insomnia.     Past Medical History:  Past Medical History:  Diagnosis Date  . Arthritis   . 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 Breast mass 08/2006, 03/2009   left breast biopsy fibroadenoma (2008) right breast biopsy benign (2010)  . 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 Central serous retinopathy   . Fatty liver   . GERD (gastroesophageal reflux disease)   .  Gestational diabetes   . Headache    migraines  . Heart murmur   . History of abnormal mammogram 08/2006, 01/2008, 03/2009  . Hypertension   . IBS (irritable bowel syndrome)   . Joint disorder    hx of left ankle tendonitis, bursitis, heel spur  . Obesity 2018   BMI 45  . Pelvic pain    adhesions and adenomyosis  . Rosacea     Past Surgical  History:  Past Surgical History:  Procedure Laterality Date  . BREAST BIOPSY Left 08/2006   benign fibroadenoma  . BREAST EXCISIONAL BIOPSY Right 02/26/2008   right breast invasive mam ca with rad partial mastectomy   . BREAST SURGERY Right    x2/ Dr Tamala Julian  . CARDIAC CATHETERIZATION  2006  . CESAREAN SECTION  04/02/1998  . CHOLECYSTECTOMY  04/2010   Dr. Smith/ laprascopic surgery  . COLONOSCOPY  04/04/2000  . ESOPHAGOGASTRODUODENOSCOPY (EGD) WITH PROPOFOL N/A 05/01/2015   Procedure: ESOPHAGOGASTRODUODENOSCOPY (EGD) WITH PROPOFOL;  Surgeon: Hulen Luster, MD;  Location: [facility] ENDOSCOPY;  Service: Gastroenterology;  Laterality: N/A;  . EYE SURGERY  08/2012   laser surgery on retina/ DR Appenzeller  . FINGER SURGERY     repair of tendon in right index, Dr. Francesco Sor in [place]  . FOOT SURGERY     Dr. Milinda Pointer  . IRRIGATION AND DEBRIDEMENT SEBACEOUS CYST     right upper back  . LAPAROSCOPIC SUPRACERVICAL HYSTERECTOMY  06/2007   Dr Kincius/ adhesions/CPP/adenomyosis  . MASTECTOMY PARTIAL / LUMPECTOMY Right 01/2008   with sentinal lymph node biopsy/ infiltrating ductal cancer  . SKIN CANCER EXCISION     back and calves  . WISDOM TOOTH EXTRACTION  1991    Family History:  Family History  Problem Relation Age of Onset  . Hypertension Mother   . Thyroid disease Mother   . Breast cancer Mother 59  . Colon cancer Mother 35       again at 70  . Stroke Father   . Hypertension Father   . Cancer Father 26       prostate  . Parkinson's disease Father   . Hypertension Sister   . Thyroid disease Sister   . Cancer Sister        skin/ squamous cell  . Lung cancer Paternal Aunt 80       smoker  . Diabetes Maternal Grandmother   . Stroke Maternal Grandfather   . Heart disease Paternal Grandfather   . Diabetes Maternal Aunt   . Lymphoma Maternal Uncle   . Thyroid cancer Cousin        maternal  . Heart disease Maternal Uncle   . Cancer Maternal Aunt   . Breast cancer Maternal Aunt 75     Social History:  Social History   Socioeconomic History  . Marital status: Married    Spouse name: Richardson Landry  . Number of children: 1  . Years of education: 51  . Highest education level: Not on file  Occupational History  . Occupation: CMA  Social Needs  . Financial resource strain: Not on file  . Food insecurity    Worry: Not on file    Inability: Not on file  . Transportation needs    Medical: Not on file    Non-medical: Not on file  Tobacco Use  . Smoking status: Never Smoker  . Smokeless tobacco: Never Used  Substance and Sexual Activity  . Alcohol use: Yes    Alcohol/week: 0.0 standard drinks  Comment: rare, 1-2 drinks per year  . Drug use: No  . Sexual activity: Yes    Partners: Male    Birth control/protection: Surgical    Comment: Hysterectomy   Lifestyle  . Physical activity    Days per week: 4 days    Minutes per session: 30 min  . Stress: Only a little  Relationships  . Social connections    Talks on phone: More than three times a week    Gets together: Once a week    Attends religious service: More than 4 times per year    Active member of club or organization: Yes    Attends meetings of clubs or organizations: More than 4 times per year    Relationship status: Married  . Intimate partner violence    Fear of current or ex partner: No    Emotionally abused: No    Physically abused: No    Forced sexual activity: No  Other Topics Concern  . Not on file  Social History Narrative   Lives in [place] with husband, no pets.  Works at [employer].      Diet - regular   Exercise - occasional    Allergies:  Allergies  Allergen Reactions  . Kiwi Extract Itching    swelling swelling swelling  . Other Swelling    Grapes--itching  . Codeine Other (See Comments)    Felt funny.  . Amoxicillin Rash  . Erythromycin Rash  . Sulfa Antibiotics Rash  . Tape Rash    Medications:  Current Outpatient Medications:  .  acetaminophen (TYLENOL) 325 MG  tablet, Take 650 mg by mouth every 6 (six) hours as needed., Disp: , Rfl:  .  amLODipine (NORVASC) 10 MG tablet, Take 0.5 tablets (5 mg total) by mouth daily., Disp: 90 tablet, Rfl: 3 .  Ascorbic Acid (VITAMIN C) 1000 MG tablet, Take by mouth., Disp: , Rfl:  .  buPROPion (WELLBUTRIN XL) 300 MG 24 hr tablet, Take by mouth., Disp: , Rfl:  .  Camphor-Eucalyptus-Menthol (VICKS VAPORUB) 4.7-1.2-2.6 % OINT, , Disp: , Rfl:  .  citalopram (CELEXA) 10 MG tablet, Take by mouth., Disp: , Rfl:  .  clotrimazole (CLOTRIMAZOLE ANTI-FUNGAL) 1 % cream, Apply topically., Disp: , Rfl:  .  diphenhydrAMINE (BENADRYL) 25 MG tablet, Take by mouth., Disp: , Rfl:  .  hyoscyamine (LEVSIN, ANASPAZ) 0.125 MG tablet, , Disp: , Rfl: 3 .  ibuprofen (ADVIL,MOTRIN) 200 MG tablet, Take 200 mg by mouth every 6 (six) hours as needed., Disp: , Rfl:  .  ketotifen (ZADITOR) 0.025 % ophthalmic solution, Apply to eye., Disp: , Rfl:  .  lisinopril (PRINIVIL,ZESTRIL) 10 MG tablet, Take by mouth., Disp: , Rfl:  .  meclizine (ANTIVERT) 25 MG tablet, , Disp: , Rfl: 0 .  metFORMIN (GLUCOPHAGE-XR) 500 MG 24 hr tablet, , Disp: , Rfl:  .  metroNIDAZOLE (METROGEL) 0.75 % gel, , Disp: , Rfl:  .  minocycline (MINOCIN) 100 MG capsule, , Disp: , Rfl:  .  omeprazole (PRILOSEC) 20 MG capsule, Take 20 mg by mouth daily., Disp: , Rfl:  .  Probiotic Product ([employer] PROBIOTIC MAXIMUM STRENGTH) CAPS, Take by mouth., Disp: , Rfl:  .  TRILYTE 420 g solution, , Disp: , Rfl:  Physical Exam Vitals: BP 118/70   Pulse 77   Ht '5\' 3"'$  (1.6 m)   Wt 266 lb 3.2 oz (120.7 kg)   BMI 47.16 kg/m   General: WF in NAD HEENT: normocephalic, anicteric Neck: no  thyroid enlargement, no palpable nodules, no cervical lymphadenopathy  Pulmonary: No increased work of breathing, CTAB Cardiovascular: RRR, without murmur  Breast: architectural distortion of the  right breast in the lower outer quadrant s/p partial mastectomy, no masses palpable, no nipple discharge. No  axillary, supraclavicular or infraclavicular adenopathy. Abdomen: Soft, non-tender, obese, non-distended.  Umbilicus without lesions.  No hepatomegaly or masses palpable. No evidence of hernia. Genitourinary:  External: Multiple epidermal cysts in bilateral labia majora; Normal urethral meatus, normal Bartholin's and Skene's glands.    Vagina: Normal vaginal mucosa, no evidence of prolapse.    Cervix: Grossly normal in appearance, no bleeding, non-tender  Uterus: surgically absent  Adnexa: No adnexal masses, non-tender  Rectal: deferred  Lymphatic: no evidence of inguinal lymphadenopathy Extremities: no edema, erythema, or tenderness Neurologic: Grossly intact Psychiatric: mood appropriate, affect full     Assessment: 58 y.o. G1P0101 annual gyn exam History of right breast cancer Obesity  Plan:   1) Breast cancer screening - recommend monthly self breast exam and annual mammograms. Mammogram was ordered today.  2) Pelvic ultrasound ordered to investigate RLQ pain  3) Cervical cancer screening - Pap was done. ASCCP guidelines and rational discussed.  Patient opts for yearly screening interval  4) Colon cancer screen-colonoscopy due next year  5) Routine healthcare maintenance including cholesterol and diabetes testing UTD . Repeat TSH in March/April (was high normal in Oct 2018).  6) Follow up after Pelvic ultrasound.   Dalia Heading, CNM

## 2019-01-22 ENCOUNTER — Other Ambulatory Visit: Payer: Self-pay

## 2019-01-22 ENCOUNTER — Other Ambulatory Visit (HOSPITAL_COMMUNITY)
Admission: RE | Admit: 2019-01-22 | Discharge: 2019-01-22 | Disposition: A | Discharge status: Home / Self Care | Payer: 59 | Source: Ambulatory Visit | Attending: Certified Nurse Midwife | Admitting: Certified Nurse Midwife

## 2019-01-22 ENCOUNTER — Ambulatory Visit (INDEPENDENT_AMBULATORY_CARE_PROVIDER_SITE_OTHER): Payer: 59 | Admitting: Certified Nurse Midwife

## 2019-01-22 ENCOUNTER — Encounter: Payer: Self-pay | Admitting: Certified Nurse Midwife

## 2019-01-22 VITALS — BP 118/70 | HR 77 | Ht 63.0 in | Wt 266.2 lb

## 2019-01-22 DIAGNOSIS — Z01419 Encounter for gynecological examination (general) (routine) without abnormal findings: Secondary | ICD-10-CM | POA: Insufficient documentation

## 2019-01-22 DIAGNOSIS — Z124 Encounter for screening for malignant neoplasm of cervix: Secondary | ICD-10-CM | POA: Diagnosis not present

## 2019-01-22 DIAGNOSIS — Z1239 Encounter for other screening for malignant neoplasm of breast: Secondary | ICD-10-CM

## 2019-01-23 DIAGNOSIS — R51 Headache: Secondary | ICD-10-CM | POA: Diagnosis not present

## 2019-01-23 DIAGNOSIS — M9901 Segmental and somatic dysfunction of cervical region: Secondary | ICD-10-CM | POA: Diagnosis not present

## 2019-01-23 DIAGNOSIS — M545 Low back pain: Secondary | ICD-10-CM | POA: Diagnosis not present

## 2019-01-23 DIAGNOSIS — M9903 Segmental and somatic dysfunction of lumbar region: Secondary | ICD-10-CM | POA: Diagnosis not present

## 2019-01-23 DIAGNOSIS — M542 Cervicalgia: Secondary | ICD-10-CM | POA: Diagnosis not present

## 2019-01-23 DIAGNOSIS — M9904 Segmental and somatic dysfunction of sacral region: Secondary | ICD-10-CM | POA: Diagnosis not present

## 2019-01-23 DIAGNOSIS — M5412 Radiculopathy, cervical region: Secondary | ICD-10-CM | POA: Diagnosis not present

## 2019-01-23 DIAGNOSIS — M9902 Segmental and somatic dysfunction of thoracic region: Secondary | ICD-10-CM | POA: Diagnosis not present

## 2019-01-23 DIAGNOSIS — M6080 Other myositis, unspecified site: Secondary | ICD-10-CM | POA: Diagnosis not present

## 2019-01-24 LAB — CYTOLOGY - PAP: Diagnosis: NEGATIVE

## 2019-01-29 ENCOUNTER — Encounter: Payer: Self-pay | Admitting: Podiatry

## 2019-01-29 ENCOUNTER — Ambulatory Visit (INDEPENDENT_AMBULATORY_CARE_PROVIDER_SITE_OTHER): Payer: 59 | Admitting: Podiatry

## 2019-01-29 ENCOUNTER — Other Ambulatory Visit: Payer: Self-pay

## 2019-01-29 VITALS — Temp 97.7°F

## 2019-01-29 DIAGNOSIS — S86312A Strain of muscle(s) and tendon(s) of peroneal muscle group at lower leg level, left leg, initial encounter: Secondary | ICD-10-CM

## 2019-01-30 ENCOUNTER — Encounter: Payer: Self-pay | Admitting: Podiatry

## 2019-01-30 NOTE — Progress Notes (Signed)
She presents today for follow-up of her Achilles tendinitis and pain to the plantar lateral aspect of her left foot.  States that after physical therapy she is approximately 90% improved.  She states I feel Like I cannot be up on my foot very long because it does not hurt all night out [place] age.  Objective: Vital signs are stable she is alert and oriented x3 she still has pain on palpation of the peroneal longus tendon as it courses beneath the arcuate portion of the cuboid Achilles tendon is nontender.  Assessment: Peroneal tendinitis tear of the peroneal longus tendon well resolved with physical therapy.  Plan: Discussed etiology pathology and surgical therapies at this point recommend that she continue physical therapy and continue her sitting at work as much as possible.

## 2019-02-03 MED FILL — OMEPRAZOLE 20 MG CPDR: 20 | 90 days supply | Qty: 180 | Fill #1

## 2019-02-08 ENCOUNTER — Other Ambulatory Visit
Admission: RE | Admit: 2019-02-08 | Discharge: 2019-02-08 | Disposition: A | Discharge status: Home / Self Care | Payer: 59 | Source: Ambulatory Visit | Attending: Gastroenterology | Admitting: Gastroenterology

## 2019-02-08 ENCOUNTER — Other Ambulatory Visit: Payer: Self-pay

## 2019-02-08 DIAGNOSIS — Z20828 Contact with and (suspected) exposure to other viral communicable diseases: Secondary | ICD-10-CM | POA: Diagnosis not present

## 2019-02-09 ENCOUNTER — Encounter: Payer: Self-pay | Admitting: *Deleted

## 2019-02-09 LAB — SARS CORONAVIRUS 2 (TAT 6-24 HRS): SARS Coronavirus 2: NEGATIVE

## 2019-02-10 MED FILL — METFORMIN HCL ER 500 MG TB2: 500 | 30 days supply | Qty: 60 | Fill #2

## 2019-02-12 ENCOUNTER — Ambulatory Visit: Payer: 59 | Admitting: Anesthesiology

## 2019-02-12 ENCOUNTER — Ambulatory Visit
Admission: RE | Admit: 2019-02-12 | Discharge: 2019-02-12 | Disposition: A | Discharge status: Home / Self Care | Payer: 59 | Source: Ambulatory Visit | Attending: Gastroenterology | Admitting: Gastroenterology

## 2019-02-12 ENCOUNTER — Other Ambulatory Visit: Payer: Self-pay

## 2019-02-12 ENCOUNTER — Encounter
Admission: RE | Disposition: A | Discharge status: Home / Self Care | Payer: Self-pay | Source: Ambulatory Visit | Attending: Gastroenterology

## 2019-02-12 ENCOUNTER — Encounter: Payer: Self-pay | Admitting: Anesthesiology

## 2019-02-12 DIAGNOSIS — E669 Obesity, unspecified: Secondary | ICD-10-CM | POA: Diagnosis not present

## 2019-02-12 DIAGNOSIS — K5790 Diverticulosis of intestine, part unspecified, without perforation or abscess without bleeding: Secondary | ICD-10-CM | POA: Diagnosis not present

## 2019-02-12 DIAGNOSIS — K7460 Unspecified cirrhosis of liver: Secondary | ICD-10-CM | POA: Insufficient documentation

## 2019-02-12 DIAGNOSIS — Z853 Personal history of malignant neoplasm of breast: Secondary | ICD-10-CM | POA: Insufficient documentation

## 2019-02-12 DIAGNOSIS — K640 First degree hemorrhoids: Secondary | ICD-10-CM | POA: Insufficient documentation

## 2019-02-12 DIAGNOSIS — K5730 Diverticulosis of large intestine without perforation or abscess without bleeding: Secondary | ICD-10-CM | POA: Diagnosis not present

## 2019-02-12 DIAGNOSIS — M1990 Unspecified osteoarthritis, unspecified site: Secondary | ICD-10-CM | POA: Insufficient documentation

## 2019-02-12 DIAGNOSIS — K228 Other specified diseases of esophagus: Secondary | ICD-10-CM | POA: Insufficient documentation

## 2019-02-12 DIAGNOSIS — Z7689 Persons encountering health services in other specified circumstances: Secondary | ICD-10-CM | POA: Diagnosis not present

## 2019-02-12 DIAGNOSIS — Z880 Allergy status to penicillin: Secondary | ICD-10-CM | POA: Insufficient documentation

## 2019-02-12 DIAGNOSIS — G4733 Obstructive sleep apnea (adult) (pediatric): Secondary | ICD-10-CM | POA: Diagnosis not present

## 2019-02-12 DIAGNOSIS — Z882 Allergy status to sulfonamides status: Secondary | ICD-10-CM | POA: Insufficient documentation

## 2019-02-12 DIAGNOSIS — Z885 Allergy status to narcotic agent status: Secondary | ICD-10-CM | POA: Insufficient documentation

## 2019-02-12 DIAGNOSIS — Z6841 Body Mass Index (BMI) 40.0 and over, adult: Secondary | ICD-10-CM | POA: Diagnosis not present

## 2019-02-12 DIAGNOSIS — K3189 Other diseases of stomach and duodenum: Secondary | ICD-10-CM | POA: Diagnosis not present

## 2019-02-12 DIAGNOSIS — F329 Major depressive disorder, single episode, unspecified: Secondary | ICD-10-CM | POA: Insufficient documentation

## 2019-02-12 DIAGNOSIS — Z7984 Long term (current) use of oral hypoglycemic drugs: Secondary | ICD-10-CM | POA: Insufficient documentation

## 2019-02-12 DIAGNOSIS — I10 Essential (primary) hypertension: Secondary | ICD-10-CM | POA: Diagnosis not present

## 2019-02-12 DIAGNOSIS — K2950 Unspecified chronic gastritis without bleeding: Secondary | ICD-10-CM | POA: Insufficient documentation

## 2019-02-12 DIAGNOSIS — Z79899 Other long term (current) drug therapy: Secondary | ICD-10-CM | POA: Diagnosis not present

## 2019-02-12 DIAGNOSIS — K219 Gastro-esophageal reflux disease without esophagitis: Secondary | ICD-10-CM | POA: Insufficient documentation

## 2019-02-12 DIAGNOSIS — K317 Polyp of stomach and duodenum: Secondary | ICD-10-CM | POA: Insufficient documentation

## 2019-02-12 DIAGNOSIS — Z1211 Encounter for screening for malignant neoplasm of colon: Secondary | ICD-10-CM | POA: Diagnosis not present

## 2019-02-12 DIAGNOSIS — Z800 Family history of malignant neoplasm of digestive organs: Secondary | ICD-10-CM | POA: Diagnosis not present

## 2019-02-12 DIAGNOSIS — K644 Residual hemorrhoidal skin tags: Secondary | ICD-10-CM | POA: Diagnosis not present

## 2019-02-12 DIAGNOSIS — Z881 Allergy status to other antibiotic agents status: Secondary | ICD-10-CM | POA: Insufficient documentation

## 2019-02-12 DIAGNOSIS — E119 Type 2 diabetes mellitus without complications: Secondary | ICD-10-CM | POA: Diagnosis not present

## 2019-02-12 HISTORY — DX: Varicella without complication: B01.9

## 2019-02-12 HISTORY — DX: Polyp of stomach and duodenum: K31.7

## 2019-02-12 HISTORY — DX: Depression, unspecified: F32.A

## 2019-02-12 HISTORY — PX: COLONOSCOPY WITH PROPOFOL: SHX5780

## 2019-02-12 HISTORY — DX: Allergy status to unspecified drugs, medicaments and biological substances: Z88.9

## 2019-02-12 HISTORY — PX: ESOPHAGOGASTRODUODENOSCOPY (EGD) WITH PROPOFOL: SHX5813

## 2019-02-12 LAB — GLUCOSE, CAPILLARY: Glucose-Capillary: 100 mg/dL — ABNORMAL HIGH (ref 70–99)

## 2019-02-12 SURGERY — ESOPHAGOGASTRODUODENOSCOPY (EGD) WITH PROPOFOL
Anesthesia: General

## 2019-02-12 MED ORDER — ONDANSETRON HCL 4 MG/2ML IJ SOLN
INTRAMUSCULAR | Status: DC | PRN
  Administered 2019-02-12: 4 mg via INTRAVENOUS

## 2019-02-12 MED ORDER — PROPOFOL 500 MG/50ML IV EMUL
INTRAVENOUS | Status: AC
  Filled 2019-02-12: qty 50

## 2019-02-12 MED ORDER — PROPOFOL 500 MG/50ML IV EMUL
INTRAVENOUS | Status: DC | PRN
  Administered 2019-02-12: 100 ug/kg/min via INTRAVENOUS

## 2019-02-12 MED ORDER — LIDOCAINE HCL (CARDIAC) PF 100 MG/5ML IV SOSY
PREFILLED_SYRINGE | INTRAVENOUS | Status: DC | PRN
  Administered 2019-02-12: 50 mg via INTRAVENOUS

## 2019-02-12 MED ORDER — PROPOFOL 10 MG/ML IV BOLUS
INTRAVENOUS | Status: DC | PRN
  Administered 2019-02-12 (×3): 10 mg via INTRAVENOUS
  Administered 2019-02-12 (×3): 20 mg via INTRAVENOUS
  Administered 2019-02-12: 10 mg via INTRAVENOUS
  Administered 2019-02-12 (×6): 20 mg via INTRAVENOUS

## 2019-02-12 MED ORDER — PROPOFOL 10 MG/ML IV BOLUS
INTRAVENOUS | Status: AC
  Filled 2019-02-12: qty 20

## 2019-02-12 MED ORDER — PROPOFOL 10 MG/ML IV BOLUS
INTRAVENOUS | Status: AC
  Filled 2019-02-12: qty 80

## 2019-02-12 MED ORDER — MIDAZOLAM HCL 2 MG/2ML IJ SOLN
INTRAMUSCULAR | Status: AC
  Filled 2019-02-12: qty 2

## 2019-02-12 MED ORDER — PROPOFOL 500 MG/50ML IV EMUL
INTRAVENOUS | Status: AC
  Filled 2019-02-12: qty 200

## 2019-02-12 MED ORDER — MIDAZOLAM HCL 2 MG/2ML IJ SOLN
INTRAMUSCULAR | Status: DC | PRN
  Administered 2019-02-12: 2 mg via INTRAVENOUS

## 2019-02-12 MED ORDER — SODIUM CHLORIDE 0.9 % IV SOLN
INTRAVENOUS | Status: DC
  Administered 2019-02-12: 1000 mL via INTRAVENOUS

## 2019-02-12 NOTE — Anesthesia Preprocedure Evaluation (Signed)
Anesthesia Evaluation  Patient identified by MRN, date of birth, ID band Patient awake    Reviewed: Allergy & Precautions, H&P , NPO status , Patient's Chart, lab work & pertinent test results  History of Anesthesia Complications Negative for: history of anesthetic complications  Airway Mallampati: II  TM Distance: >3 FB Neck ROM: full    Dental no notable dental hx. (+) Teeth Intact   Pulmonary neg shortness of breath, sleep apnea ,    Pulmonary exam normal breath sounds clear to auscultation       Cardiovascular Exercise Tolerance: Good hypertension, (-) angina(-) Past MI and (-) DOE Normal cardiovascular exam+ Valvular Problems/Murmurs  Rhythm:regular Rate:Normal     Neuro/Psych  Headaches, PSYCHIATRIC DISORDERS Anxiety Depression    GI/Hepatic Neg liver ROS, GERD  ,  Endo/Other  diabetesMorbid obesity  Renal/GU negative Renal ROS  negative genitourinary   Musculoskeletal  (+) Arthritis ,   Abdominal   Peds  Hematology negative hematology ROS (+)   Anesthesia Other Findings Past Medical History:   Hypertension                                                 Cancer ([place])                                                   Comment:breast   Arthritis                                                    Fatty liver                                                  Headache                                                       Comment:migraines  Past Surgical History:   FINGER SURGERY                                                  Comment:right index, Dr. Francesco Sor in                        Comment:right upper back   [place]  Comment:supercervical, Dr. Rayford Halsted   BREAST SURGERY                                                  Comment:bilateral,  Dr. Tamala Julian   MASTECTOMY PARTIAL / LUMPECTOMY                                 Comment:Dr. Tamala Julian, right breast   SKIN CANCER EXCISION                                            Comment:back and calves   CHOLECYSTECTOMY                                                 Comment:Dr. [place]                                                    Comment:Dr. Hyatt  BMI    Body Mass Index   47.17 kg/m 2    Signs and symptoms suggestive of sleep apnea    Reproductive/Obstetrics negative OB ROS                             Anesthesia Physical  Anesthesia Plan  ASA: III  Anesthesia Plan: General   Post-op Pain Management:    Induction: Intravenous  PONV Risk Score and Plan:   Airway Management Planned: Nasal Cannula  Additional Equipment:   Intra-op Plan:   Post-operative Plan:   Informed Consent: I have reviewed the patients History and Physical, chart, labs and discussed the procedure including the risks, benefits and alternatives for the proposed anesthesia with the patient or authorized representative who has indicated his/her understanding and acceptance.     Dental Advisory Given  Plan Discussed with: Anesthesiologist, CRNA and Surgeon  Anesthesia Plan Comments:         Anesthesia Quick Evaluation

## 2019-02-12 NOTE — Transfer of Care (Signed)
Immediate Anesthesia Transfer of Care Note  Patient: Jacqueline Deleon  Procedure(s) Performed: ESOPHAGOGASTRODUODENOSCOPY (EGD) WITH PROPOFOL (N/A ) COLONOSCOPY WITH PROPOFOL (N/A )  Patient Location: PACU  Anesthesia Type:General  Level of Consciousness: awake  Airway & Oxygen Therapy: Patient Spontanous Breathing  Post-op Assessment: Report given to RN  Post vital signs: stable  Last Vitals:  Vitals Value Taken Time  BP 141/57 02/12/19 0839  Temp 36.5 C 02/12/19 0839  Pulse 86 02/12/19 0839  Resp 8 02/12/19 0839  SpO2 96 % 02/12/19 0839  Vitals shown include unvalidated device data.  Last Pain:  Vitals:   02/12/19 0839  TempSrc: Tympanic  PainSc: 0-No pain         Complications: No apparent anesthesia complications

## 2019-02-12 NOTE — Op Note (Signed)
[facility] Gastroenterology Patient Name: Jacqueline Deleon Procedure Date: 02/12/2019 7:16 AM MRN: [id number] Account #: [id number] Date of Birth: [birth year] Admit Type: Outpatient Age: 58 Room: [facility] ENDO ROOM 3 Gender: Female Note Status: Finalized Procedure:            Colonoscopy Indications:          Family history of colon cancer in a first-degree                        relative before age 62 years Providers:            Lollie Sails, MD Referring MD:         Caprice Renshaw MD (Referring MD) Medicines:            Monitored Anesthesia Care Complications:        No immediate complications. Procedure:            Pre-Anesthesia Assessment:                       - ASA Grade Assessment: III - A patient with severe                        systemic disease.                       After obtaining informed consent, the colonoscope was                        passed under direct vision. Throughout the procedure,                        the patient's blood pressure, pulse, and oxygen                        saturations were monitored continuously. The                        Colonoscope was introduced through the anus and                        advanced to the the cecum, identified by appendiceal                        orifice and ileocecal valve. The patient tolerated the                        procedure well. The quality of the bowel preparation                        was good. Findings:      Multiple medium-mouthed diverticula were found in the sigmoid colon and       descending colon.      Non-bleeding external and internal hemorrhoids were found during       retroflexion and during anoscopy. The hemorrhoids were small and Grade I       (internal hemorrhoids that do not prolapse).      The digital rectal exam was normal otherwise. Impression:           - Diverticulosis in the sigmoid colon and in the  descending colon.                       -  Non-bleeding external and internal hemorrhoids.                       - No specimens collected. Recommendation:       - Discharge patient to home.                       - Repeat colonoscopy in 5 years for screening purposes. Procedure Code(s):    --- Professional ---                       [phone], Colonoscopy, flexible; diagnostic, including                        collection of specimen(s) by brushing or washing, when                        performed (separate procedure) CPT copyright 2019 American Medical Association. All rights reserved. The codes documented in this report are preliminary and upon coder review may  be revised to meet current compliance requirements. Lollie Sails, MD 02/12/2019 8:36:54 AM This report has been signed electronically. Number of Addenda: 0 Note Initiated On: 02/12/2019 7:16 AM Scope Withdrawal Time: 0 hours 5 minutes 56 seconds  Total Procedure Duration: 0 hours 15 minutes 57 seconds       [facility]

## 2019-02-12 NOTE — Anesthesia Post-op Follow-up Note (Signed)
Anesthesia QCDR form completed.        

## 2019-02-12 NOTE — Op Note (Signed)
[facility] Gastroenterology Patient Name: Jacqueline Deleon Procedure Date: 02/12/2019 7:21 AM MRN: [id number] Account #: [id number] Date of Birth: [birth year] Admit Type: Outpatient Age: 58 Room: [facility] ENDO ROOM 3 Gender: Female Note Status: Finalized Procedure:            Upper GI endoscopy Indications:          Cirrhosis rule out esophageal varices Providers:            Lollie Sails, MD Referring MD:         Caprice Renshaw MD (Referring MD) Medicines:            Monitored Anesthesia Care Complications:        No immediate complications. Procedure:            Pre-Anesthesia Assessment:                       - ASA Grade Assessment: III - A patient with severe                        systemic disease.                       After obtaining informed consent, the endoscope was                        passed under direct vision. Throughout the procedure,                        the patient's blood pressure, pulse, and oxygen                        saturations were monitored continuously. The Endoscope                        was introduced through the mouth, and advanced to the                        third part of duodenum. The upper GI endoscopy was                        accomplished without difficulty. The patient tolerated                        the procedure. Findings:      no evidence of esophageal varices.      The Z-line was variable. Biopsies were taken with a cold forceps for       histology.      Multiple 1 to 6 mm sessile polyps with no bleeding and no stigmata of       recent bleeding were found in the gastric body. Biopsies were taken with       a cold forceps for histology.      Diffuse minimal erythematous mucosa without bleeding was found in the       gastric body.      mild cobblestoning noted in the gastric body, possible very mild portal       gastropathy.      Biopsies were taken with a cold forceps in the gastric body and in the       gastric  antrum for histology.      The cardia and  gastric fundus were normal on retroflexion otherwise.      The examined duodenum was normal. Impression:           - Z-line variable. Biopsied.                       - Multiple gastric polyps. Biopsied.                       - Erythematous mucosa in the gastric body.                       - Normal examined duodenum.                       - Biopsies were taken with a cold forceps for histology                        in the gastric body and in the gastric antrum. Recommendation:       - Await pathology results.                       - Continue present medications. Procedure Code(s):    --- Professional ---                       [id number], Esophagogastroduodenoscopy, flexible, transoral;                        with biopsy, single or multiple Diagnosis Code(s):    --- Professional ---                       K22.8, Other specified diseases of esophagus                       K31.7, Polyp of stomach and duodenum                       K31.89, Other diseases of stomach and duodenum                       K74.60, Unspecified cirrhosis of liver CPT copyright 2019 American Medical Association. All rights reserved. The codes documented in this report are preliminary and upon coder review may  be revised to meet current compliance requirements. Lollie Sails, MD 02/12/2019 8:15:52 AM This report has been signed electronically. Number of Addenda: 0 Note Initiated On: 02/12/2019 7:21 AM      [facility]

## 2019-02-12 NOTE — H&P (Signed)
Outpatient short stay form Pre-procedure 02/12/2019 7:41 AM Jacqueline Sails MD  Primary Physician: Dr Derinda Late  Reason for visit: EGD and colonoscopy  History of present illness: Patient is a 58 year old female presenting today as above.  She has a relatively recent diagnosis of cirrhosis of the liver and is presenting for variceal screening in that regard.  She occasionally gets some epigastric discomfort but no nausea vomiting or abdominal pain.  Some fullness feeling at times in the right upper quadrant.  Is also a family history of colon cancer primary relative.  Her bowel habits are variable with occasional diarrhea.  Patient tolerated her prep well.  She takes no aspirin or blood thinning agent.    Current Facility-Administered Medications:  .  0.9 %  sodium chloride infusion, , Intravenous, Continuous, Jacqueline Sails, MD, Last Rate: 20 mL/hr at 02/12/19 0717, 1,000 mL at 02/12/19 4431  Medications Prior to Admission  Medication Sig Dispense Refill Last Dose  . acetaminophen (TYLENOL) 325 MG tablet Take 650 mg by mouth every 6 (six) hours as needed.   Past Week at Unknown time  . amLODipine (NORVASC) 10 MG tablet Take 0.5 tablets (5 mg total) by mouth daily. 90 tablet 3 02/11/2019 at Unknown time  . Ascorbic Acid (VITAMIN C) 1000 MG tablet Take by mouth.   Past Week at Unknown time  . buPROPion (WELLBUTRIN XL) 300 MG 24 hr tablet Take by mouth.   02/11/2019 at Unknown time  . Camphor-Eucalyptus-Menthol (VICKS VAPORUB) 4.7-1.2-2.6 % OINT    Past Week at Unknown time  . citalopram (CELEXA) 10 MG tablet Take by mouth.   02/11/2019 at Unknown time  . clotrimazole (CLOTRIMAZOLE ANTI-FUNGAL) 1 % cream Apply topically.   Past Week at Unknown time  . diphenhydrAMINE (BENADRYL) 25 MG tablet Take by mouth.   Past Week at Unknown time  . hyoscyamine (LEVSIN, ANASPAZ) 0.125 MG tablet   3 Past Week at Unknown time  . ibuprofen (ADVIL,MOTRIN) 200 MG tablet Take 200 mg by mouth every 6 (six)  hours as needed.   Past Week at Unknown time  . ketotifen (ZADITOR) 0.025 % ophthalmic solution Apply to eye.   Past Week at Unknown time  . LACTOBACILLUS PROBIOTIC PO Take by mouth.   Past Week at Unknown time  . lisinopril (PRINIVIL,ZESTRIL) 10 MG tablet Take by mouth.   02/11/2019 at Unknown time  . loratadine (CLARITIN) 10 MG tablet Take 10 mg by mouth daily.   02/11/2019 at Unknown time  . meclizine (ANTIVERT) 25 MG tablet   0 Past Week at Unknown time  . metFORMIN (GLUCOPHAGE-XR) 500 MG 24 hr tablet    Past Week at Unknown time  . metroNIDAZOLE (METROGEL) 0.75 % gel    Past Week at Unknown time  . minocycline (MINOCIN) 100 MG capsule    02/11/2019 at Unknown time  . Multiple Vitamin (MULTIVITAMIN) tablet Take 1 tablet by mouth daily.   Past Week at Unknown time  . omeprazole (PRILOSEC) 20 MG capsule Take 20 mg by mouth daily.   02/11/2019 at Unknown time  . Probiotic Product ([employer] PROBIOTIC MAXIMUM STRENGTH) CAPS Take by mouth.   Past Week at Unknown time  . TRILYTE 420 g solution    Past Week at Unknown time     Allergies  Allergen Reactions  . Kiwi Extract Itching    swelling swelling swelling  . Other Swelling    Grapes--itching  . Codeine Other (See Comments)    Felt funny.  Marland Kitchen  Amoxicillin Rash  . Erythromycin Rash  . Sulfa Antibiotics Rash  . Tape Rash     Past Medical History:  Diagnosis Date  . Allergic genetic state   . Arthritis   . 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 Breast mass 08/2006, 03/2009   left breast biopsy fibroadenoma (2008) right breast biopsy benign (2010)  . 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 Central serous retinopathy   . Chicken pox   . Depression   . Fatty liver   . Fatty liver   . Gastric polyps   . GERD (gastroesophageal reflux disease)   . Gestational diabetes   . Headache    migraines  . Heart murmur   . History of abnormal mammogram 08/2006, 01/2008, 03/2009  . Hypertension   .  IBS (irritable bowel syndrome)   . Joint disorder    hx of left ankle tendonitis, bursitis, heel spur  . Obesity 2018   BMI 45  . Pelvic pain    adhesions and adenomyosis  . Rosacea     Review of systems:      Physical Exam    Heart and lungs: Regular rate and rhythm without rub or gallop lungs are bilaterally clear    HEENT: Normocephalic atraumatic eyes are anicteric    Other:    Pertinant exam for procedure: Soft nontender nondistended bowel sounds positive normoactive    Planned proceedures: EGD, colonoscopy and indicated procedures. I have discussed the risks benefits and complications of procedures to include not limited to bleeding, infection, perforation and the risk of sedation and the patient wishes to proceed.    Jacqueline Sails, MD Gastroenterology 02/12/2019  7:41 AM

## 2019-02-12 NOTE — Anesthesia Postprocedure Evaluation (Signed)
Anesthesia Post Note  Patient: Jacqueline Deleon  Procedure(s) Performed: ESOPHAGOGASTRODUODENOSCOPY (EGD) WITH PROPOFOL (N/A ) COLONOSCOPY WITH PROPOFOL (N/A )  Patient location during evaluation: Endoscopy Anesthesia Type: General Level of consciousness: awake and alert and oriented Pain management: pain level controlled Vital Signs Assessment: post-procedure vital signs reviewed and stable Respiratory status: spontaneous breathing Cardiovascular status: blood pressure returned to baseline Anesthetic complications: no     Last Vitals:  Vitals:   02/12/19 0849 02/12/19 0859  BP: (!) 161/84 (!) 160/86  Pulse: 77 75  Resp: 12 14  Temp:    SpO2: 100% 98%    Last Pain:  Vitals:   02/12/19 0859  TempSrc:   PainSc: 0-No pain                 Craig Ionescu

## 2019-02-13 ENCOUNTER — Encounter: Payer: Self-pay | Admitting: Gastroenterology

## 2019-02-14 LAB — SURGICAL PATHOLOGY

## 2019-02-22 DIAGNOSIS — R51 Headache: Secondary | ICD-10-CM | POA: Diagnosis not present

## 2019-02-22 DIAGNOSIS — M5412 Radiculopathy, cervical region: Secondary | ICD-10-CM | POA: Diagnosis not present

## 2019-02-22 DIAGNOSIS — M9904 Segmental and somatic dysfunction of sacral region: Secondary | ICD-10-CM | POA: Diagnosis not present

## 2019-02-22 DIAGNOSIS — M9901 Segmental and somatic dysfunction of cervical region: Secondary | ICD-10-CM | POA: Diagnosis not present

## 2019-02-22 DIAGNOSIS — M9903 Segmental and somatic dysfunction of lumbar region: Secondary | ICD-10-CM | POA: Diagnosis not present

## 2019-02-22 DIAGNOSIS — M6080 Other myositis, unspecified site: Secondary | ICD-10-CM | POA: Diagnosis not present

## 2019-02-22 DIAGNOSIS — M542 Cervicalgia: Secondary | ICD-10-CM | POA: Diagnosis not present

## 2019-02-22 DIAGNOSIS — M545 Low back pain: Secondary | ICD-10-CM | POA: Diagnosis not present

## 2019-02-22 DIAGNOSIS — M9902 Segmental and somatic dysfunction of thoracic region: Secondary | ICD-10-CM | POA: Diagnosis not present

## 2019-02-27 ENCOUNTER — Other Ambulatory Visit: Payer: Self-pay | Admitting: Gastroenterology

## 2019-02-27 DIAGNOSIS — K7460 Unspecified cirrhosis of liver: Secondary | ICD-10-CM | POA: Diagnosis not present

## 2019-03-12 MED FILL — metFORMIN HCL ER 500 MG TB2: 500 | 30 days supply | Qty: 60 | Fill #3

## 2019-03-14 DIAGNOSIS — E119 Type 2 diabetes mellitus without complications: Secondary | ICD-10-CM | POA: Diagnosis not present

## 2019-03-27 DIAGNOSIS — M9901 Segmental and somatic dysfunction of cervical region: Secondary | ICD-10-CM | POA: Diagnosis not present

## 2019-03-27 DIAGNOSIS — M542 Cervicalgia: Secondary | ICD-10-CM | POA: Diagnosis not present

## 2019-03-27 DIAGNOSIS — M545 Low back pain: Secondary | ICD-10-CM | POA: Diagnosis not present

## 2019-03-27 DIAGNOSIS — M9904 Segmental and somatic dysfunction of sacral region: Secondary | ICD-10-CM | POA: Diagnosis not present

## 2019-03-27 DIAGNOSIS — M9903 Segmental and somatic dysfunction of lumbar region: Secondary | ICD-10-CM | POA: Diagnosis not present

## 2019-03-27 DIAGNOSIS — M6080 Other myositis, unspecified site: Secondary | ICD-10-CM | POA: Diagnosis not present

## 2019-03-27 DIAGNOSIS — M9902 Segmental and somatic dysfunction of thoracic region: Secondary | ICD-10-CM | POA: Diagnosis not present

## 2019-03-27 DIAGNOSIS — M5412 Radiculopathy, cervical region: Secondary | ICD-10-CM | POA: Diagnosis not present

## 2019-03-27 DIAGNOSIS — R51 Headache: Secondary | ICD-10-CM | POA: Diagnosis not present

## 2019-03-28 ENCOUNTER — Ambulatory Visit
Admission: RE | Admit: 2019-03-28 | Discharge: 2019-03-28 | Disposition: A | Discharge status: Home / Self Care | Payer: 59 | Source: Ambulatory Visit | Attending: Certified Nurse Midwife | Admitting: Certified Nurse Midwife

## 2019-03-28 DIAGNOSIS — Z1231 Encounter for screening mammogram for malignant neoplasm of breast: Secondary | ICD-10-CM | POA: Insufficient documentation

## 2019-03-28 DIAGNOSIS — Z1239 Encounter for other screening for malignant neoplasm of breast: Secondary | ICD-10-CM

## 2019-04-11 MED FILL — METFORMIN HCL ER 500 MG TB2: 500 | 90 days supply | Qty: 180 | Fill #0

## 2019-04-11 MED FILL — CITALOPRAM HBR 10 MG TABLET: 10 | 90 days supply | Qty: 90 | Fill #1

## 2019-04-11 MED FILL — LISINOPRIL 10 MG TABS: 10 | 90 days supply | Qty: 90 | Fill #0

## 2019-04-12 MED FILL — AMLODIPINE BESYLATE 10 MG T: 10 | 90 days supply | Qty: 90 | Fill #0

## 2019-04-13 MED FILL — buPROPion HCL ER (XL) 300 M: 300 | 90 days supply | Qty: 90 | Fill #1

## 2019-05-04 MED FILL — OMEPRAZOLE 20 MG CAP: 20 | 90 days supply | Qty: 180 | Fill #0

## 2019-05-08 DIAGNOSIS — M9902 Segmental and somatic dysfunction of thoracic region: Secondary | ICD-10-CM | POA: Diagnosis not present

## 2019-05-08 DIAGNOSIS — M9904 Segmental and somatic dysfunction of sacral region: Secondary | ICD-10-CM | POA: Diagnosis not present

## 2019-05-08 DIAGNOSIS — M6080 Other myositis, unspecified site: Secondary | ICD-10-CM | POA: Diagnosis not present

## 2019-05-08 DIAGNOSIS — M5412 Radiculopathy, cervical region: Secondary | ICD-10-CM | POA: Diagnosis not present

## 2019-05-08 DIAGNOSIS — R519 Headache, unspecified: Secondary | ICD-10-CM | POA: Diagnosis not present

## 2019-05-08 DIAGNOSIS — M9903 Segmental and somatic dysfunction of lumbar region: Secondary | ICD-10-CM | POA: Diagnosis not present

## 2019-05-08 DIAGNOSIS — M545 Low back pain: Secondary | ICD-10-CM | POA: Diagnosis not present

## 2019-05-08 DIAGNOSIS — M9901 Segmental and somatic dysfunction of cervical region: Secondary | ICD-10-CM | POA: Diagnosis not present

## 2019-05-08 DIAGNOSIS — M542 Cervicalgia: Secondary | ICD-10-CM | POA: Diagnosis not present

## 2019-06-19 DIAGNOSIS — M9904 Segmental and somatic dysfunction of sacral region: Secondary | ICD-10-CM | POA: Diagnosis not present

## 2019-06-19 DIAGNOSIS — M6080 Other myositis, unspecified site: Secondary | ICD-10-CM | POA: Diagnosis not present

## 2019-06-19 DIAGNOSIS — M9901 Segmental and somatic dysfunction of cervical region: Secondary | ICD-10-CM | POA: Diagnosis not present

## 2019-06-19 DIAGNOSIS — M9903 Segmental and somatic dysfunction of lumbar region: Secondary | ICD-10-CM | POA: Diagnosis not present

## 2019-06-19 DIAGNOSIS — M542 Cervicalgia: Secondary | ICD-10-CM | POA: Diagnosis not present

## 2019-06-19 DIAGNOSIS — M5412 Radiculopathy, cervical region: Secondary | ICD-10-CM | POA: Diagnosis not present

## 2019-06-19 DIAGNOSIS — R519 Headache, unspecified: Secondary | ICD-10-CM | POA: Diagnosis not present

## 2019-06-19 DIAGNOSIS — M9902 Segmental and somatic dysfunction of thoracic region: Secondary | ICD-10-CM | POA: Diagnosis not present

## 2019-06-19 DIAGNOSIS — M545 Low back pain: Secondary | ICD-10-CM | POA: Diagnosis not present

## 2019-06-28 ENCOUNTER — Other Ambulatory Visit: Payer: Self-pay

## 2019-06-28 ENCOUNTER — Other Ambulatory Visit: Payer: Self-pay | Admitting: Podiatry

## 2019-06-28 ENCOUNTER — Encounter

## 2019-06-28 ENCOUNTER — Ambulatory Visit (INDEPENDENT_AMBULATORY_CARE_PROVIDER_SITE_OTHER): Payer: 59 | Admitting: Podiatry

## 2019-06-28 ENCOUNTER — Ambulatory Visit (INDEPENDENT_AMBULATORY_CARE_PROVIDER_SITE_OTHER): Payer: 59

## 2019-06-28 ENCOUNTER — Encounter: Payer: Self-pay | Admitting: Podiatry

## 2019-06-28 DIAGNOSIS — M7671 Peroneal tendinitis, right leg: Secondary | ICD-10-CM | POA: Diagnosis not present

## 2019-06-28 DIAGNOSIS — M79671 Pain in right foot: Secondary | ICD-10-CM

## 2019-06-28 DIAGNOSIS — M7751 Other enthesopathy of right foot: Secondary | ICD-10-CM

## 2019-06-28 DIAGNOSIS — M722 Plantar fascial fibromatosis: Secondary | ICD-10-CM

## 2019-06-28 NOTE — Progress Notes (Signed)
Subjective:  Patient ID: Jacqueline Deleon, female    DOB: 1961-07-07,  MRN: [id number]  Chief Complaint  Patient presents with  . Foot Pain    Patient comes in today for right lateral side foot and heel pain x 2 weeks and progressivly getting worse.     58 y.o. female presents with the above complaint.  Patient presents with pain on the right lateral aspect of her foot.  Patient states that this pain has been going on for 2 weeks.  She states that the pain radiates to the heel.  It has progressively gotten worse.  Patient states it feels like walking on a marble.  It is constantly hurting her.  She has tried ibuprofen and Epsom salt soaks.  None of these conservative therapies has helped alleviate the pain.  She was seen by Dr. Milinda Pointer in July to be treated for the left side since then all of the left foot pain has completely resolved.  She denies any other acute complaints at this time.   Review of Systems: Negative except as noted in the HPI. Denies N/V/F/Ch.  Past Medical History:  Diagnosis Date  . Allergic genetic state   . Arthritis   . 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 Breast mass 08/2006, 03/2009   left breast biopsy fibroadenoma (2008) right breast biopsy benign (2010)  . 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 Central serous retinopathy   . Chicken pox   . Depression   . Fatty liver   . Fatty liver   . Gastric polyps   . GERD (gastroesophageal reflux disease)   . Gestational diabetes   . Headache    migraines  . Heart murmur   . History of abnormal mammogram 08/2006, 01/2008, 03/2009  . Hypertension   . IBS (irritable bowel syndrome)   . Joint disorder    hx of left ankle tendonitis, bursitis, heel spur  . Obesity 2018   BMI 45  . Pelvic pain    adhesions and adenomyosis  . Rosacea     Current Outpatient Medications:  .  acetaminophen (TYLENOL) 325 MG tablet, Take 650 mg by mouth every 6 (six) hours as  needed., Disp: , Rfl:  .  amLODipine (NORVASC) 10 MG tablet, Take 0.5 tablets (5 mg total) by mouth daily., Disp: 90 tablet, Rfl: 3 .  Ascorbic Acid (VITAMIN C) 1000 MG tablet, Take by mouth., Disp: , Rfl:  .  buPROPion (WELLBUTRIN XL) 300 MG 24 hr tablet, Take by mouth., Disp: , Rfl:  .  Camphor-Eucalyptus-Menthol (VICKS VAPORUB) 4.7-1.2-2.6 % OINT, , Disp: , Rfl:  .  citalopram (CELEXA) 10 MG tablet, Take by mouth., Disp: , Rfl:  .  clotrimazole (CLOTRIMAZOLE ANTI-FUNGAL) 1 % cream, Apply topically., Disp: , Rfl:  .  diphenhydrAMINE (BENADRYL) 25 MG tablet, Take by mouth., Disp: , Rfl:  .  hyoscyamine (LEVSIN, ANASPAZ) 0.125 MG tablet, , Disp: , Rfl: 3 .  ibuprofen (ADVIL,MOTRIN) 200 MG tablet, Take 200 mg by mouth every 6 (six) hours as needed., Disp: , Rfl:  .  ketotifen (ZADITOR) 0.025 % ophthalmic solution, Apply to eye., Disp: , Rfl:  .  LACTOBACILLUS PROBIOTIC PO, Take by mouth., Disp: , Rfl:  .  lisinopril (PRINIVIL,ZESTRIL) 10 MG tablet, Take by mouth., Disp: , Rfl:  .  loratadine (CLARITIN) 10 MG tablet, Take 10 mg by mouth daily., Disp: , Rfl:  .  meclizine (  ANTIVERT) 25 MG tablet, , Disp: , Rfl: 0 .  metFORMIN (GLUCOPHAGE-XR) 500 MG 24 hr tablet, , Disp: , Rfl:  .  metroNIDAZOLE (METROGEL) 0.75 % gel, , Disp: , Rfl:  .  minocycline (MINOCIN) 100 MG capsule, , Disp: , Rfl:  .  Multiple Vitamin (MULTIVITAMIN) tablet, Take 1 tablet by mouth daily., Disp: , Rfl:  .  omeprazole (PRILOSEC) 20 MG capsule, Take 20 mg by mouth daily., Disp: , Rfl:  .  Probiotic Product ([employer] PROBIOTIC MAXIMUM STRENGTH) CAPS, Take by mouth., Disp: , Rfl:  .  TRILYTE 420 g solution, , Disp: , Rfl:   Social History   Tobacco Use  Smoking Status Never Smoker  Smokeless Tobacco Never Used    Allergies  Allergen Reactions  . Kiwi Extract Itching    swelling swelling swelling  . Other Swelling    Grapes--itching  . Codeine Other (See Comments)    Felt funny.  . Amoxicillin Rash  . Erythromycin  Rash  . Sulfa Antibiotics Rash  . Tape Rash   Objective:  There were no vitals filed for this visit. There is no height or weight on file to calculate BMI. Constitutional Well developed. Well nourished.  Vascular Dorsalis pedis pulses palpable bilaterally. Posterior tibial pulses palpable bilaterally. Capillary refill normal to all digits.  No cyanosis or clubbing noted. Pedal hair growth normal.  Neurologic Normal speech. Oriented to person, place, and time. Epicritic sensation to light touch grossly present bilaterally.  Dermatologic Nails well groomed and normal in appearance. No open wounds. No skin lesions.  Orthopedic:  Pain on palpation to the peroneal longus tendon as it courses way to the plantar aspect of the cuboid.  There is an ossicle present on the x-ray that could be the culprit.  Pain with inversion as well as resisted eversion.  No pain with dorsiflexion or plantarflexion.  Previous surgical scar noted along the course of the tendon.  Patient had a surgery done 15 years ago.   Radiographs: Right foot 3 views of skeletally mature adult: There is multiple bone spur noted on the plantar heel posterior heel as well as os peroneum of the cuboid noted.There is mild arthritic changes noted of the dorsal midfoot.  No other bony deformities noted. Assessment:   1. Plantar fasciitis, right    Plan:  Patient was evaluated and treated and all questions answered.  Right peroneal tendinitis/os peroneum syndrome -I explained to the patient the etiology of peroneal tendinitis with a possible relationship for os peroneum.  Given the clinical findings of pain right as the peroneal longus tendon courses underneath the plantar aspect of the cuboid near the os peroneal ossicle, I believe this is the etiology of the pain.  Given the amount of pain I believe she will benefit from immobilization with a cam boot. -Also given the amount of inflammation that is surrounding the left plantar  lateral foot I believe she will benefit from a steroid injection to decrease inflammation around the tendon but not within the tendon given the high risk of rupture.  Patient agrees with the injection like to proceed. -A steroid injection was performed at right lateral plantar cuboid using 1% plain Lidocaine and 10 mg of Kenalog. This was well tolerated. -If no resolve meant of symptoms will consider ordering MRI of the right foot during next visit.  Reducible pes planus deformity -I believe patient will benefit from custom-made orthotics to help support the arches of the foot as well as support the tendon  itself.  Patient has failed over-the-counter orthotics and it appears to be wearing down and causing her some pain.  I believe she will benefit from custom-made orthotics. -Patient will be scheduled to see Liliane Channel to make custom-made orthotics and I would like for him to offload the plantar aspect of the cuboid if possible.  This will help with alleviation of the pain.    No follow-ups on file.

## 2019-07-04 ENCOUNTER — Ambulatory Visit (INDEPENDENT_AMBULATORY_CARE_PROVIDER_SITE_OTHER): Payer: 59 | Admitting: Orthotics

## 2019-07-04 ENCOUNTER — Encounter: Payer: Self-pay | Admitting: Podiatry

## 2019-07-04 DIAGNOSIS — M79671 Pain in right foot: Secondary | ICD-10-CM

## 2019-07-04 DIAGNOSIS — M7752 Other enthesopathy of left foot: Secondary | ICD-10-CM

## 2019-07-04 DIAGNOSIS — M7671 Peroneal tendinitis, right leg: Secondary | ICD-10-CM

## 2019-07-04 DIAGNOSIS — M7751 Other enthesopathy of right foot: Secondary | ICD-10-CM

## 2019-07-04 MED FILL — LISINOPRIL 10 MG TABS: 10 | 90 days supply | Qty: 90 | Fill #1

## 2019-07-04 MED FILL — metFORMIN HCL ER 500 MG TB2: 500 | 90 days supply | Qty: 180 | Fill #1

## 2019-07-04 NOTE — Progress Notes (Signed)
Patient came into today to be cast for Custom Foot Orthotics. Upon recommendation of Dr. Posey Pronto Patient presents with Peroneal Tendonitis/os peraneum Goals are cuboid offload and valgus RF wedge Plan vendor

## 2019-07-05 MED FILL — AMLODIPINE BESYLATE 10 MG T: 10 | 90 days supply | Qty: 90 | Fill #1

## 2019-07-10 MED FILL — CITALOPRAM HBR 10 MG TABLET: 10 | 90 days supply | Qty: 90 | Fill #0

## 2019-07-11 MED FILL — BUPROPION HCL XL 300 MG TAB: 300 | 30 days supply | Qty: 30 | Fill #0

## 2019-07-17 ENCOUNTER — Ambulatory Visit
Admission: RE | Admit: 2019-07-17 | Discharge: 2019-07-17 | Disposition: A | Discharge status: Home / Self Care | Payer: 59 | Source: Ambulatory Visit | Attending: Gastroenterology | Admitting: Gastroenterology

## 2019-07-17 ENCOUNTER — Other Ambulatory Visit: Payer: Self-pay

## 2019-07-17 DIAGNOSIS — M542 Cervicalgia: Secondary | ICD-10-CM | POA: Diagnosis not present

## 2019-07-17 DIAGNOSIS — M545 Low back pain: Secondary | ICD-10-CM | POA: Diagnosis not present

## 2019-07-17 DIAGNOSIS — M5412 Radiculopathy, cervical region: Secondary | ICD-10-CM | POA: Diagnosis not present

## 2019-07-17 DIAGNOSIS — M9903 Segmental and somatic dysfunction of lumbar region: Secondary | ICD-10-CM | POA: Diagnosis not present

## 2019-07-17 DIAGNOSIS — K7460 Unspecified cirrhosis of liver: Secondary | ICD-10-CM | POA: Diagnosis not present

## 2019-07-17 DIAGNOSIS — M9904 Segmental and somatic dysfunction of sacral region: Secondary | ICD-10-CM | POA: Diagnosis not present

## 2019-07-17 DIAGNOSIS — M9901 Segmental and somatic dysfunction of cervical region: Secondary | ICD-10-CM | POA: Diagnosis not present

## 2019-07-17 DIAGNOSIS — R519 Headache, unspecified: Secondary | ICD-10-CM | POA: Diagnosis not present

## 2019-07-17 DIAGNOSIS — M9902 Segmental and somatic dysfunction of thoracic region: Secondary | ICD-10-CM | POA: Diagnosis not present

## 2019-07-17 DIAGNOSIS — M6080 Other myositis, unspecified site: Secondary | ICD-10-CM | POA: Diagnosis not present

## 2019-07-24 ENCOUNTER — Encounter: Payer: Self-pay | Admitting: Podiatry

## 2019-07-24 ENCOUNTER — Ambulatory Visit: Payer: 59

## 2019-07-25 ENCOUNTER — Ambulatory Visit: Payer: 59 | Admitting: Podiatry

## 2019-07-26 ENCOUNTER — Ambulatory Visit: Payer: 59 | Admitting: Podiatry

## 2019-07-31 ENCOUNTER — Ambulatory Visit (INDEPENDENT_AMBULATORY_CARE_PROVIDER_SITE_OTHER): Payer: 59 | Admitting: Podiatry

## 2019-07-31 ENCOUNTER — Other Ambulatory Visit: Payer: Self-pay

## 2019-07-31 DIAGNOSIS — M79671 Pain in right foot: Secondary | ICD-10-CM | POA: Diagnosis not present

## 2019-07-31 DIAGNOSIS — M7751 Other enthesopathy of right foot: Secondary | ICD-10-CM

## 2019-07-31 DIAGNOSIS — M7671 Peroneal tendinitis, right leg: Secondary | ICD-10-CM | POA: Diagnosis not present

## 2019-08-01 ENCOUNTER — Encounter: Payer: Self-pay | Admitting: Podiatry

## 2019-08-01 NOTE — Progress Notes (Signed)
Subjective:  Patient ID: Jacqueline Deleon, female    DOB: May 24, 1961,  MRN: [id number]  Chief Complaint  Patient presents with  . Foot Pain    pt is here for a f/u of peroneal tendinitis of the right leg, pt states that her right foot is doing better overall, the injection she recieved last time has helped as well, pt is also here to recieve orthotics as well.    59 y.o. female presents with the above complaint.  Patient presents with pain on the right lateral aspect of her foot.  Patient states that she still has pain to the right lateral foot.  Patient states the pain is on and off.  She states she has been wearing the cam boot which has been helping but she has not been able to come off the cam boot.  She still has pain with and without the cam boot.  Patient states the injection she received last time did help a little bit but her pain is still there.  Her pain is 6 out of 10.  She had a history of surgery done by Dr. Milinda Pointer in the past.  Review of Systems: Negative except as noted in the HPI. Denies N/V/F/Ch.  Past Medical History:  Diagnosis Date  . Allergic genetic state   . Arthritis   . 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 Breast mass 08/2006, 03/2009   left breast biopsy fibroadenoma (2008) right breast biopsy benign (2010)  . 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 Central serous retinopathy   . Chicken pox   . Depression   . Fatty liver   . Fatty liver   . Gastric polyps   . GERD (gastroesophageal reflux disease)   . Gestational diabetes   . Headache    migraines  . Heart murmur   . History of abnormal mammogram 08/2006, 01/2008, 03/2009  . Hypertension   . IBS (irritable bowel syndrome)   . Joint disorder    hx of left ankle tendonitis, bursitis, heel spur  . Obesity 2018   BMI 45  . Pelvic pain    adhesions and adenomyosis  . Rosacea     Current Outpatient Medications:  .  acetaminophen (TYLENOL) 325 MG  tablet, Take 650 mg by mouth every 6 (six) hours as needed., Disp: , Rfl:  .  amLODipine (NORVASC) 10 MG tablet, Take 0.5 tablets (5 mg total) by mouth daily., Disp: 90 tablet, Rfl: 3 .  Ascorbic Acid (VITAMIN C) 1000 MG tablet, Take by mouth., Disp: , Rfl:  .  buPROPion (WELLBUTRIN XL) 300 MG 24 hr tablet, Take by mouth., Disp: , Rfl:  .  Camphor-Eucalyptus-Menthol (VICKS VAPORUB) 4.7-1.2-2.6 % OINT, , Disp: , Rfl:  .  citalopram (CELEXA) 10 MG tablet, Take by mouth., Disp: , Rfl:  .  clotrimazole (CLOTRIMAZOLE ANTI-FUNGAL) 1 % cream, Apply topically., Disp: , Rfl:  .  diphenhydrAMINE (BENADRYL) 25 MG tablet, Take by mouth., Disp: , Rfl:  .  hyoscyamine (LEVSIN, ANASPAZ) 0.125 MG tablet, , Disp: , Rfl: 3 .  ibuprofen (ADVIL,MOTRIN) 200 MG tablet, Take 200 mg by mouth every 6 (six) hours as needed., Disp: , Rfl:  .  ketotifen (ZADITOR) 0.025 % ophthalmic solution, Apply to eye., Disp: , Rfl:  .  LACTOBACILLUS PROBIOTIC PO, Take by mouth., Disp: , Rfl:  .  lisinopril (PRINIVIL,ZESTRIL) 10 MG tablet, Take by mouth., Disp: , Rfl:  .  loratadine (CLARITIN) 10 MG tablet, Take 10 mg by mouth daily., Disp: , Rfl:  .  meclizine (ANTIVERT) 25 MG tablet, , Disp: , Rfl: 0 .  metFORMIN (GLUCOPHAGE-XR) 500 MG 24 hr tablet, , Disp: , Rfl:  .  metroNIDAZOLE (METROGEL) 0.75 % gel, , Disp: , Rfl:  .  minocycline (MINOCIN) 100 MG capsule, , Disp: , Rfl:  .  Multiple Vitamin (MULTIVITAMIN) tablet, Take 1 tablet by mouth daily., Disp: , Rfl:  .  omeprazole (PRILOSEC) 20 MG capsule, Take 20 mg by mouth daily., Disp: , Rfl:  .  Probiotic Product ([employer] PROBIOTIC MAXIMUM STRENGTH) CAPS, Take by mouth., Disp: , Rfl:  .  TRILYTE 420 g solution, , Disp: , Rfl:   Social History   Tobacco Use  Smoking Status Never Smoker  Smokeless Tobacco Never Used    Allergies  Allergen Reactions  . Kiwi Extract Itching    swelling swelling swelling  . Other Swelling    Grapes--itching  . Codeine Other (See Comments)     Felt funny.  . Amoxicillin Rash  . Erythromycin Rash  . Sulfa Antibiotics Rash  . Tape Rash   Objective:  There were no vitals filed for this visit. There is no height or weight on file to calculate BMI. Constitutional Well developed. Well nourished.  Vascular Dorsalis pedis pulses palpable bilaterally. Posterior tibial pulses palpable bilaterally. Capillary refill normal to all digits.  No cyanosis or clubbing noted. Pedal hair growth normal.  Neurologic Normal speech. Oriented to person, place, and time. Epicritic sensation to light touch grossly present bilaterally.  Dermatologic Nails well groomed and normal in appearance. No open wounds. No skin lesions.  Orthopedic:  Pain on palpation to the peroneal longus tendon as it courses way to the plantar aspect of the cuboid.  There is an ossicle present on the x-ray that could be the culprit.  Pain with inversion as well as resisted eversion.  No pain with dorsiflexion or plantarflexion.  Previous surgical scar noted along the course of the tendon.  Patient had a surgery done 15 years ago.   Radiographs: Right foot 3 views of skeletally mature adult: There is multiple bone spur noted on the plantar heel posterior heel as well as os peroneum of the cuboid noted.There is mild arthritic changes noted of the dorsal midfoot.  No other bony deformities noted. Assessment:   1. Peroneal tendinitis, right leg   2. 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  Right peroneal tendinitis/os peroneum syndrome -I explained to the patient the etiology of peroneal tendinitis with a possible relationship for os peroneum.  Given the clinical findings of pain right as the peroneal longus tendon courses underneath the plantar aspect of the cuboid near the os peroneal ossicle, I believe this is the etiology of the pain.  Given the amount of pain I believe she will  benefit from immobilization with a cam boot. -It appears that the last steroid injection I gave somewhat helped and therefore I will hold off on another steroid injection for now. -Given that patient does not have any resolve meant in pain even with immobilization with a cam boot I believe patient will benefit from an MRI to evaluate the right foot along the course of the tendon to see if there is any potential tearing.  Reducible pes planus deformity -Orthotics were dispensed to the patient.  I  explained to the patient of breaking in period.  Patient states understanding and she will start off slowly before going 100% with the orthotics.  If there are any issues I asked the patient to come and see me sooner.    Return in about 4 weeks (around 08/28/2019).

## 2019-08-02 ENCOUNTER — Telehealth: Payer: Self-pay

## 2019-08-02 DIAGNOSIS — M7671 Peroneal tendinitis, right leg: Secondary | ICD-10-CM

## 2019-08-02 MED FILL — OMEPRAZOLE 20 MG CAP: 20 | 90 days supply | Qty: 180 | Fill #1

## 2019-08-02 MED FILL — metroNIDAZOLE 0.75 % GEL: 0.75 | 20 days supply | Qty: 45 | Fill #0

## 2019-08-02 NOTE — Telephone Encounter (Signed)
Per Crystal with [facility], no precert is required for MRI.  Reference # [id number] Patient has been notified and will call scheduling to set up appt to her convenience.

## 2019-08-02 NOTE — Telephone Encounter (Signed)
-----   Message from Felipa Furnace, DPM sent at 08/01/2019  8:07 AM EST ----- Regarding: MRI to foot/heel right Hi Angie,  Would you be able to order an MRI for this patient to evaluate right heel/foot.  And pretty much try to evaluate the course of the peroneal tendon starting from posterior aspect of the lateral malleolus down to the cuboid.  Let me know if there are any issues  Thanks Lennette Bihari

## 2019-08-04 MED FILL — BUPROPION HCL XL 300 MG TAB: 300 | 30 days supply | Qty: 30 | Fill #1

## 2019-08-06 ENCOUNTER — Other Ambulatory Visit: Payer: Self-pay | Admitting: Gastroenterology

## 2019-08-06 DIAGNOSIS — K7460 Unspecified cirrhosis of liver: Secondary | ICD-10-CM

## 2019-08-06 DIAGNOSIS — K769 Liver disease, unspecified: Secondary | ICD-10-CM

## 2019-08-10 ENCOUNTER — Encounter: Payer: Self-pay | Admitting: Podiatry

## 2019-08-14 DIAGNOSIS — M545 Low back pain: Secondary | ICD-10-CM | POA: Diagnosis not present

## 2019-08-14 DIAGNOSIS — M9904 Segmental and somatic dysfunction of sacral region: Secondary | ICD-10-CM | POA: Diagnosis not present

## 2019-08-14 DIAGNOSIS — M9901 Segmental and somatic dysfunction of cervical region: Secondary | ICD-10-CM | POA: Diagnosis not present

## 2019-08-14 DIAGNOSIS — R519 Headache, unspecified: Secondary | ICD-10-CM | POA: Diagnosis not present

## 2019-08-14 DIAGNOSIS — M9903 Segmental and somatic dysfunction of lumbar region: Secondary | ICD-10-CM | POA: Diagnosis not present

## 2019-08-14 DIAGNOSIS — M5412 Radiculopathy, cervical region: Secondary | ICD-10-CM | POA: Diagnosis not present

## 2019-08-14 DIAGNOSIS — M9902 Segmental and somatic dysfunction of thoracic region: Secondary | ICD-10-CM | POA: Diagnosis not present

## 2019-08-14 DIAGNOSIS — M542 Cervicalgia: Secondary | ICD-10-CM | POA: Diagnosis not present

## 2019-08-14 DIAGNOSIS — M6080 Other myositis, unspecified site: Secondary | ICD-10-CM | POA: Diagnosis not present

## 2019-08-19 ENCOUNTER — Ambulatory Visit
Admission: RE | Admit: 2019-08-19 | Discharge: 2019-08-19 | Disposition: A | Discharge status: Home / Self Care | Payer: 59 | Source: Ambulatory Visit | Attending: Podiatry | Admitting: Podiatry

## 2019-08-19 ENCOUNTER — Ambulatory Visit
Admission: RE | Admit: 2019-08-19 | Discharge: 2019-08-19 | Disposition: A | Discharge status: Home / Self Care | Payer: 59 | Source: Ambulatory Visit | Attending: Gastroenterology | Admitting: Gastroenterology

## 2019-08-19 ENCOUNTER — Other Ambulatory Visit: Payer: Self-pay

## 2019-08-19 DIAGNOSIS — K769 Liver disease, unspecified: Secondary | ICD-10-CM

## 2019-08-19 DIAGNOSIS — K760 Fatty (change of) liver, not elsewhere classified: Secondary | ICD-10-CM | POA: Diagnosis not present

## 2019-08-19 DIAGNOSIS — K7460 Unspecified cirrhosis of liver: Secondary | ICD-10-CM | POA: Insufficient documentation

## 2019-08-19 DIAGNOSIS — M7671 Peroneal tendinitis, right leg: Secondary | ICD-10-CM | POA: Insufficient documentation

## 2019-08-19 DIAGNOSIS — M19071 Primary osteoarthritis, right ankle and foot: Secondary | ICD-10-CM | POA: Diagnosis not present

## 2019-08-19 MED ORDER — GADOBUTROL 1 MMOL/ML IV SOLN
10.0000 mL | Freq: Once | INTRAVENOUS | Status: AC | PRN
  Administered 2019-08-19: 10 mL via INTRAVENOUS

## 2019-08-22 ENCOUNTER — Other Ambulatory Visit: Payer: Self-pay | Admitting: Certified Nurse Midwife

## 2019-08-22 DIAGNOSIS — E119 Type 2 diabetes mellitus without complications: Secondary | ICD-10-CM

## 2019-08-22 DIAGNOSIS — K760 Fatty (change of) liver, not elsewhere classified: Secondary | ICD-10-CM

## 2019-08-22 DIAGNOSIS — Z79899 Other long term (current) drug therapy: Secondary | ICD-10-CM

## 2019-08-22 DIAGNOSIS — E782 Mixed hyperlipidemia: Secondary | ICD-10-CM

## 2019-08-24 ENCOUNTER — Other Ambulatory Visit: Payer: 59

## 2019-08-24 ENCOUNTER — Other Ambulatory Visit: Payer: Self-pay

## 2019-08-24 DIAGNOSIS — E119 Type 2 diabetes mellitus without complications: Secondary | ICD-10-CM | POA: Diagnosis not present

## 2019-08-24 DIAGNOSIS — K760 Fatty (change of) liver, not elsewhere classified: Secondary | ICD-10-CM | POA: Diagnosis not present

## 2019-08-24 DIAGNOSIS — Z79899 Other long term (current) drug therapy: Secondary | ICD-10-CM | POA: Diagnosis not present

## 2019-08-24 DIAGNOSIS — E782 Mixed hyperlipidemia: Secondary | ICD-10-CM

## 2019-08-25 LAB — CBC WITH DIFFERENTIAL/PLATELET
Basophils Absolute: 0 10*3/uL (ref 0.0–0.2)
Basos: 1 %
EOS (ABSOLUTE): 0.2 10*3/uL (ref 0.0–0.4)
Eos: 3 %
Hematocrit: 43.6 % (ref 34.0–46.6)
Hemoglobin: 13.6 g/dL (ref 11.1–15.9)
Immature Grans (Abs): 0 10*3/uL (ref 0.0–0.1)
Immature Granulocytes: 0 %
Lymphocytes Absolute: 1.5 10*3/uL (ref 0.7–3.1)
Lymphs: 21 %
MCH: 25.3 pg — ABNORMAL LOW (ref 26.6–33.0)
MCHC: 31.2 g/dL — ABNORMAL LOW (ref 31.5–35.7)
MCV: 81 fL (ref 79–97)
Monocytes Absolute: 0.7 10*3/uL (ref 0.1–0.9)
Monocytes: 10 %
Neutrophils Absolute: 4.6 10*3/uL (ref 1.4–7.0)
Neutrophils: 65 %
Platelets: 245 10*3/uL (ref 150–450)
RBC: 5.38 x10E6/uL — ABNORMAL HIGH (ref 3.77–5.28)
RDW: 16.3 % — ABNORMAL HIGH (ref 11.7–15.4)
WBC: 7.1 10*3/uL (ref 3.4–10.8)

## 2019-08-25 LAB — COMPREHENSIVE METABOLIC PANEL
ALT: 22 IU/L (ref 0–32)
AST: 16 IU/L (ref 0–40)
Albumin/Globulin Ratio: 1.7 (ref 1.2–2.2)
Albumin: 4.5 g/dL (ref 3.8–4.9)
Alkaline Phosphatase: 138 IU/L — ABNORMAL HIGH (ref 39–117)
BUN/Creatinine Ratio: 20 (ref 9–23)
BUN: 16 mg/dL (ref 6–24)
Bilirubin Total: 0.2 mg/dL (ref 0.0–1.2)
CO2: 20 mmol/L (ref 20–29)
Calcium: 9.5 mg/dL (ref 8.7–10.2)
Chloride: 105 mmol/L (ref 96–106)
Creatinine, Ser: 0.79 mg/dL (ref 0.57–1.00)
GFR calc Af Amer: 97 mL/min/{1.73_m2} (ref >59)
GFR calc non Af Amer: 84 mL/min/{1.73_m2} (ref >59)
Globulin, Total: 2.6 g/dL (ref 1.5–4.5)
Glucose: 94 mg/dL (ref 65–99)
Potassium: 4.6 mmol/L (ref 3.5–5.2)
Sodium: 142 mmol/L (ref 134–144)
Total Protein: 7.1 g/dL (ref 6.0–8.5)

## 2019-08-25 LAB — HEMOGLOBIN A1C
Est. average glucose Bld gHb Est-mCnc: 117 mg/dL
Hgb A1c MFr Bld: 5.7 % — ABNORMAL HIGH (ref 4.8–5.6)

## 2019-08-25 LAB — LIPID PANEL
Chol/HDL Ratio: 2.8 ratio (ref 0.0–4.4)
Cholesterol, Total: 160 mg/dL (ref 100–199)
HDL: 58 mg/dL (ref >39)
LDL Chol Calc (NIH): 82 mg/dL (ref 0–99)
Triglycerides: 113 mg/dL (ref 0–149)
VLDL Cholesterol Cal: 20 mg/dL (ref 5–40)

## 2019-08-28 ENCOUNTER — Ambulatory Visit (INDEPENDENT_AMBULATORY_CARE_PROVIDER_SITE_OTHER): Payer: 59 | Admitting: Podiatry

## 2019-08-28 ENCOUNTER — Other Ambulatory Visit: Payer: Self-pay

## 2019-08-28 DIAGNOSIS — Z0000 Encounter for general adult medical examination without abnormal findings: Secondary | ICD-10-CM | POA: Diagnosis not present

## 2019-08-28 DIAGNOSIS — M7751 Other enthesopathy of right foot: Secondary | ICD-10-CM | POA: Diagnosis not present

## 2019-08-28 DIAGNOSIS — E119 Type 2 diabetes mellitus without complications: Secondary | ICD-10-CM | POA: Diagnosis not present

## 2019-08-28 DIAGNOSIS — M79671 Pain in right foot: Secondary | ICD-10-CM

## 2019-08-28 DIAGNOSIS — M7671 Peroneal tendinitis, right leg: Secondary | ICD-10-CM | POA: Diagnosis not present

## 2019-08-28 DIAGNOSIS — I10 Essential (primary) hypertension: Secondary | ICD-10-CM | POA: Diagnosis not present

## 2019-08-28 DIAGNOSIS — F331 Major depressive disorder, recurrent, moderate: Secondary | ICD-10-CM | POA: Diagnosis not present

## 2019-08-28 MED FILL — CITALOPRAM HBR 20 MG TABLET: 20 | 90 days supply | Qty: 90 | Fill #0

## 2019-08-29 ENCOUNTER — Encounter: Payer: Self-pay | Admitting: Podiatry

## 2019-08-29 NOTE — Progress Notes (Signed)
Subjective:  Patient ID: Jacqueline Deleon, female    DOB: [birth year],  MRN: [id number]  Chief Complaint  Patient presents with  . Foot Pain    pt is here for tendinitis of the right foot, pt states that she is feeling better but is still feeling pain, pt puts pain scale as a 8 out of 10 on the pain scale    59 y.o. female presents with the above complaint.  Patient presents with pain on the right lateral aspect of her foot.  Patient states that she still has pain to the right lateral foot.  She has been wearing her boot.  However there is still some pain associated with it.  Patient is concerned that the orthotics are not comfortable and is not reducing the pain.  Patient is taking ibuprofen up to 1600 mg in a day.  Her pain scale is 8 out of 10 at its worst.  However she states that it is manageable with all the modifications.  She denies any other acute complaints.  She would like to know if there is any further management that could be done for this.  Patient had an MRI done of the foot which was reviewed at today's visit.  Review of Systems: Negative except as noted in the HPI. Denies N/V/F/Ch.  Past Medical History:  Diagnosis Date  . Allergic genetic state   . Arthritis   . 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 Breast mass 08/2006, 03/2009   left breast biopsy fibroadenoma (2008) right breast biopsy benign (2010)  . 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 Central serous retinopathy   . Chicken pox   . Depression   . Fatty liver   . Fatty liver   . Gastric polyps   . GERD (gastroesophageal reflux disease)   . Gestational diabetes   . Headache    migraines  . Heart murmur   . History of abnormal mammogram 08/2006, 01/2008, 03/2009  . Hypertension   . IBS (irritable bowel syndrome)   . Joint disorder    hx of left ankle tendonitis, bursitis, heel spur  . Obesity 2018   BMI 45  . Pelvic pain    adhesions and adenomyosis  .  Rosacea     Current Outpatient Medications:  .  acetaminophen (TYLENOL) 325 MG tablet, Take 650 mg by mouth every 6 (six) hours as needed., Disp: , Rfl:  .  amLODipine (NORVASC) 10 MG tablet, Take 0.5 tablets (5 mg total) by mouth daily., Disp: 90 tablet, Rfl: 3 .  Ascorbic Acid (VITAMIN C) 1000 MG tablet, Take by mouth., Disp: , Rfl:  .  buPROPion (WELLBUTRIN XL) 300 MG 24 hr tablet, Take by mouth., Disp: , Rfl:  .  Camphor-Eucalyptus-Menthol (VICKS VAPORUB) 4.7-1.2-2.6 % OINT, , Disp: , Rfl:  .  citalopram (CELEXA) 10 MG tablet, Take by mouth., Disp: , Rfl:  .  clotrimazole (CLOTRIMAZOLE ANTI-FUNGAL) 1 % cream, Apply topically., Disp: , Rfl:  .  diphenhydrAMINE (BENADRYL) 25 MG tablet, Take by mouth., Disp: , Rfl:  .  hyoscyamine (LEVSIN, ANASPAZ) 0.125 MG tablet, , Disp: , Rfl: 3 .  ibuprofen (ADVIL,MOTRIN) 200 MG tablet, Take 200 mg by mouth every 6 (six) hours as needed., Disp: , Rfl:  .  ketotifen (ZADITOR) 0.025 % ophthalmic solution, Apply to eye., Disp: , Rfl:  .  lisinopril (PRINIVIL,ZESTRIL) 10 MG tablet, Take by mouth., Disp: ,  Rfl:  .  loratadine (CLARITIN) 10 MG tablet, Take 10 mg by mouth daily., Disp: , Rfl:  .  meclizine (ANTIVERT) 25 MG tablet, , Disp: , Rfl: 0 .  metFORMIN (GLUCOPHAGE-XR) 500 MG 24 hr tablet, , Disp: , Rfl:  .  metroNIDAZOLE (METROGEL) 0.75 % gel, , Disp: , Rfl:  .  minocycline (MINOCIN) 100 MG capsule, , Disp: , Rfl:  .  Multiple Vitamin (MULTIVITAMIN) tablet, Take 1 tablet by mouth daily., Disp: , Rfl:  .  omeprazole (PRILOSEC) 20 MG capsule, Take 20 mg by mouth daily., Disp: , Rfl:  .  LACTOBACILLUS PROBIOTIC PO, Take by mouth., Disp: , Rfl:  .  Probiotic Product ([employer] PROBIOTIC MAXIMUM STRENGTH) CAPS, Take by mouth., Disp: , Rfl:  .  TRILYTE 420 g solution, , Disp: , Rfl:   Social History   Tobacco Use  Smoking Status Never Smoker  Smokeless Tobacco Never Used    Allergies  Allergen Reactions  . Kiwi Extract Itching     swelling swelling swelling  . Other Swelling    Grapes--itching  . Codeine Other (See Comments)    Felt funny.  . Amoxicillin Rash  . Erythromycin Rash  . Sulfa Antibiotics Rash  . Tape Rash   Objective:  There were no vitals filed for this visit. There is no height or weight on file to calculate BMI. Constitutional Well developed. Well nourished.  Vascular Dorsalis pedis pulses palpable bilaterally. Posterior tibial pulses palpable bilaterally. Capillary refill normal to all digits.  No cyanosis or clubbing noted. Pedal hair growth normal.  Neurologic Normal speech. Oriented to person, place, and time. Epicritic sensation to light touch grossly present bilaterally.  Dermatologic Nails well groomed and normal in appearance. No open wounds. No skin lesions.  Orthopedic:  Pain on palpation to the peroneal longus tendon as it courses way to the plantar aspect of the cuboid.  There is an ossicle present on the x-ray that could be the culprit.  Pain with inversion as well as resisted eversion.  No pain with dorsiflexion or plantarflexion.  Previous surgical scar noted along the course of the tendon.  Patient had a surgery done 15 years ago.   Radiographs: IMPRESSION: Mild insertional Achilles tendinosis without tear.  Mild degenerative change at the calcaneocuboid joint and articulation of the middle and medial cuneiforms. Assessment:   1. Peroneal tendinitis, right leg   2. 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  Right peroneal tendinitis/os peroneum syndrome -I explained to the patient the etiology of peroneal tendinitis with a possible relationship for os peroneum.  Given the clinical findings of pain right as the peroneal longus tendon courses underneath the plantar aspect of the cuboid near the os peroneal ossicle. -MRI was reviewed and discussed in great details with the patient  which showed no significant finding of the peroneal tendons.  However given clinically her pain correlates right at the junction of peroneus longus tendon with an associated ossicle I believe patient will benefit from a surgical intervention to debride the tendon if needed as well as excision of the ossicle.  However after discussing extensively the surgical outcome with the patient she would like to think about the surgery and only if her pain gets progressively worse she will consider doing it.  I have asked her to come back and see me in a month and we can reevaluate in  terms of have her pain is.  She states understanding.  Reducible pes planus deformity -Orthotics were dispensed to the patient.  I explained to the patient of breaking in period.  Patient states understanding and she will start off slowly before going 100% with the orthotics.  If there are any issues I asked the patient to come and see me sooner.    No follow-ups on file.

## 2019-09-04 ENCOUNTER — Other Ambulatory Visit: Payer: Self-pay | Admitting: Certified Nurse Midwife

## 2019-09-04 DIAGNOSIS — K7460 Unspecified cirrhosis of liver: Secondary | ICD-10-CM

## 2019-09-04 DIAGNOSIS — K2100 Gastro-esophageal reflux disease with esophagitis, without bleeding: Secondary | ICD-10-CM | POA: Diagnosis not present

## 2019-09-04 MED FILL — BuPROPion HCL ER (XL) 300 M: 300 | 30 days supply | Qty: 30 | Fill #2

## 2019-09-05 ENCOUNTER — Other Ambulatory Visit: Payer: Self-pay

## 2019-09-05 ENCOUNTER — Other Ambulatory Visit: Payer: 59

## 2019-09-05 DIAGNOSIS — K7460 Unspecified cirrhosis of liver: Secondary | ICD-10-CM | POA: Diagnosis not present

## 2019-09-06 ENCOUNTER — Encounter: Payer: Self-pay | Admitting: Podiatry

## 2019-09-06 LAB — PROTIME-INR
INR: 1 (ref 0.9–1.2)
Prothrombin Time: 10.6 s (ref 9.1–12.0)

## 2019-09-06 LAB — AFP TUMOR MARKER: AFP, Serum, Tumor Marker: 1.4 ng/mL (ref 0.0–8.3)

## 2019-09-17 ENCOUNTER — Encounter: Payer: Self-pay | Admitting: Podiatry

## 2019-09-25 ENCOUNTER — Ambulatory Visit (INDEPENDENT_AMBULATORY_CARE_PROVIDER_SITE_OTHER): Payer: 59 | Admitting: Podiatry

## 2019-09-25 ENCOUNTER — Other Ambulatory Visit: Payer: Self-pay

## 2019-09-25 DIAGNOSIS — M9904 Segmental and somatic dysfunction of sacral region: Secondary | ICD-10-CM | POA: Diagnosis not present

## 2019-09-25 DIAGNOSIS — M79671 Pain in right foot: Secondary | ICD-10-CM

## 2019-09-25 DIAGNOSIS — M7751 Other enthesopathy of right foot: Secondary | ICD-10-CM

## 2019-09-25 DIAGNOSIS — M5412 Radiculopathy, cervical region: Secondary | ICD-10-CM | POA: Diagnosis not present

## 2019-09-25 DIAGNOSIS — M545 Low back pain: Secondary | ICD-10-CM | POA: Diagnosis not present

## 2019-09-25 DIAGNOSIS — M542 Cervicalgia: Secondary | ICD-10-CM | POA: Diagnosis not present

## 2019-09-25 DIAGNOSIS — M9902 Segmental and somatic dysfunction of thoracic region: Secondary | ICD-10-CM | POA: Diagnosis not present

## 2019-09-25 DIAGNOSIS — R519 Headache, unspecified: Secondary | ICD-10-CM | POA: Diagnosis not present

## 2019-09-25 DIAGNOSIS — M7671 Peroneal tendinitis, right leg: Secondary | ICD-10-CM

## 2019-09-25 DIAGNOSIS — M9903 Segmental and somatic dysfunction of lumbar region: Secondary | ICD-10-CM | POA: Diagnosis not present

## 2019-09-25 DIAGNOSIS — M9901 Segmental and somatic dysfunction of cervical region: Secondary | ICD-10-CM | POA: Diagnosis not present

## 2019-09-25 DIAGNOSIS — M6080 Other myositis, unspecified site: Secondary | ICD-10-CM | POA: Diagnosis not present

## 2019-09-25 NOTE — Patient Instructions (Signed)
Pre-Operative Instructions  Congratulations, you have decided to take an important step towards improving your quality of life.  You can be assured that the doctors and staff at [facility] will be with you every step of the way.  Here are some important things you should know:  1. Plan to be at the surgery center/hospital at least 1 (one) hour prior to your scheduled time, unless otherwise directed by the surgical center/hospital staff.  You must have a responsible adult accompany you, remain during the surgery and drive you home.  Make sure you have directions to the surgical center/hospital to ensure you arrive on time. 2. If you are having surgery at [facility] or [facility] hospitals, you will need a copy of your medical history and physical form from your family physician within one month prior to the date of surgery. We will give you a form for your primary physician to complete.  3. We make every effort to accommodate the date you request for surgery.  However, there are times where surgery dates or times have to be moved.  We will contact you as soon as possible if a change in schedule is required.   4. No aspirin/ibuprofen for one week before surgery.  If you are on aspirin, any non-steroidal anti-inflammatory medications (Mobic, Aleve, Ibuprofen) should not be taken seven (7) days prior to your surgery.  You make take Tylenol for pain prior to surgery.  5. Medications - If you are taking daily heart and blood pressure medications, seizure, reflux, allergy, asthma, anxiety, pain or diabetes medications, make sure you notify the surgery center/hospital before the day of surgery so they can tell you which medications you should take or avoid the day of surgery. 6. No food or drink after midnight the night before surgery unless directed otherwise by surgical center/hospital staff. 7. No alcoholic beverages 24-hours prior to surgery.  No smoking 24-hours prior or 24-hours after  surgery. 8. Wear loose pants or shorts. They should be loose enough to fit over bandages, boots, and casts. 9. Don't wear slip-on shoes. Sneakers are preferred. 10. Bring your boot with you to the surgery center/hospital.  Also bring crutches or a walker if your physician has prescribed it for you.  If you do not have this equipment, it will be provided for you after surgery. 11. If you have not been contacted by the surgery center/hospital by the day before your surgery, call to confirm the date and time of your surgery. 12. Leave-time from work may vary depending on the type of surgery you have.  Appropriate arrangements should be made prior to surgery with your employer. 13. Prescriptions will be provided immediately following surgery by your doctor.  Fill these as soon as possible after surgery and take the medication as directed. Pain medications will not be refilled on weekends and must be approved by the doctor. 14. Remove nail polish on the operative foot and avoid getting pedicures prior to surgery. 15. Wash the night before surgery.  The night before surgery wash the foot and leg well with water and the antibacterial soap provided. Be sure to pay special attention to beneath the toenails and in between the toes.  Wash for at least three (3) minutes. Rinse thoroughly with water and dry well with a towel.  Perform this wash unless told not to do so by your physician.  Enclosed: 1 Ice pack (please put in freezer the night before surgery)   1 Hibiclens skin cleaner     Pre-op instructions  If you have any questions regarding the instructions, please do not hesitate to call our office.  [place]: [address], [place], [place] [address] -- [phone]  [place]: [address] [place], [place] [address] -- [phone]  [place]: [address], [place], [place] [address] -- [phone]   Website: [email or web address] 

## 2019-09-26 ENCOUNTER — Encounter: Payer: Self-pay | Admitting: Podiatry

## 2019-09-26 ENCOUNTER — Telehealth: Payer: Self-pay

## 2019-09-26 NOTE — Progress Notes (Signed)
Subjective:  Patient ID: Jacqueline Deleon, female    DOB: March 12, 1961,  MRN: [id number]  Chief Complaint  Patient presents with  . Foot Pain    pt is here for a f/u of peroneal tendinitis of the right leg, pt states that she is not feeling better, but is not feeling worse as well.    59 y.o. female presents with the above complaint.  Patient presents with a follow-up of right lateral aspect of the pain which is still going on.  Patient has tried wearing a boot patient has tried injection but none of that has helped.  Patient had the surgery done 15 years ago.  The MRI did not show any significant changes however clinically her pain is located along the course of the tendon as well as to the insertion of the peroneal tendon as well.  On x-ray there appears to be ossicles along the course of the tendon may be due to trauma that could be causing her pain.  However she states that she is not feeling better but not feeling worse to the pain is still about the same.  The pain will also often travel up the leg.  The pain scale is 8 out of 10.  Patient states the orthotics are helping as well.  She denies any other acute complaints.  She is ready to have a surgical intervention to help address this pain.  She is also going out of town over the next couple of weeks we will plan on doing surgery after she can returns.  Review of Systems: Negative except as noted in the HPI. Denies N/V/F/Ch.  Past Medical History:  Diagnosis Date  . Allergic genetic state   . Arthritis   . 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 Breast mass 08/2006, 03/2009   left breast biopsy fibroadenoma (2008) right breast biopsy benign (2010)  . 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 Central serous retinopathy   . Chicken pox   . Depression   . Fatty liver   . Fatty liver   . Gastric polyps   . GERD (gastroesophageal reflux disease)   . Gestational diabetes   . Headache    migraines  . Heart murmur   . History of abnormal mammogram 08/2006, 01/2008, 03/2009  . Hypertension   . IBS (irritable bowel syndrome)   . Joint disorder    hx of left ankle tendonitis, bursitis, heel spur  . Obesity 2018   BMI 45  . Pelvic pain    adhesions and adenomyosis  . Rosacea     Current Outpatient Medications:  .  acetaminophen (TYLENOL) 325 MG tablet, Take 650 mg by mouth every 6 (six) hours as needed., Disp: , Rfl:  .  amLODipine (NORVASC) 10 MG tablet, Take 0.5 tablets (5 mg total) by mouth daily., Disp: 90 tablet, Rfl: 3 .  Ascorbic Acid (VITAMIN C) 1000 MG tablet, Take by mouth., Disp: , Rfl:  .  buPROPion (WELLBUTRIN XL) 300 MG 24 hr tablet, Take by mouth., Disp: , Rfl:  .  Camphor-Eucalyptus-Menthol (VICKS VAPORUB) 4.7-1.2-2.6 % OINT, , Disp: , Rfl:  .  citalopram (CELEXA) 10 MG tablet, Take by mouth., Disp: , Rfl:  .  clotrimazole (CLOTRIMAZOLE ANTI-FUNGAL) 1 % cream, Apply topically., Disp: , Rfl:  .  diphenhydrAMINE (BENADRYL) 25 MG tablet, Take by mouth., Disp: , Rfl:  .  hyoscyamine (LEVSIN, ANASPAZ) 0.125 MG tablet, ,  Disp: , Rfl: 3 .  ibuprofen (ADVIL,MOTRIN) 200 MG tablet, Take 200 mg by mouth every 6 (six) hours as needed., Disp: , Rfl:  .  ketotifen (ZADITOR) 0.025 % ophthalmic solution, Apply to eye., Disp: , Rfl:  .  LACTOBACILLUS PROBIOTIC PO, Take by mouth., Disp: , Rfl:  .  lisinopril (PRINIVIL,ZESTRIL) 10 MG tablet, Take by mouth., Disp: , Rfl:  .  loratadine (CLARITIN) 10 MG tablet, Take 10 mg by mouth daily., Disp: , Rfl:  .  meclizine (ANTIVERT) 25 MG tablet, , Disp: , Rfl: 0 .  metFORMIN (GLUCOPHAGE-XR) 500 MG 24 hr tablet, , Disp: , Rfl:  .  metroNIDAZOLE (METROGEL) 0.75 % gel, , Disp: , Rfl:  .  minocycline (MINOCIN) 100 MG capsule, , Disp: , Rfl:  .  Multiple Vitamin (MULTIVITAMIN) tablet, Take 1 tablet by mouth daily., Disp: , Rfl:  .  omeprazole (PRILOSEC) 20 MG capsule, Take 20 mg by mouth daily., Disp: , Rfl:  .  Probiotic Product ([employer]  PROBIOTIC MAXIMUM STRENGTH) CAPS, Take by mouth., Disp: , Rfl:  .  TRILYTE 420 g solution, , Disp: , Rfl:   Social History   Tobacco Use  Smoking Status Never Smoker  Smokeless Tobacco Never Used    Allergies  Allergen Reactions  . Kiwi Extract Itching    swelling swelling swelling  . Other Swelling    Grapes--itching  . Codeine Other (See Comments)    Felt funny.  . Amoxicillin Rash  . Erythromycin Rash  . Sulfa Antibiotics Rash  . Tape Rash    adhesives   Objective:  There were no vitals filed for this visit. There is no height or weight on file to calculate BMI. Constitutional Well developed. Well nourished.  Vascular Dorsalis pedis pulses palpable bilaterally. Posterior tibial pulses palpable bilaterally. Capillary refill normal to all digits.  No cyanosis or clubbing noted. Pedal hair growth normal.  Neurologic Normal speech. Oriented to person, place, and time. Epicritic sensation to light touch grossly present bilaterally.  Dermatologic Nails well groomed and normal in appearance. No open wounds. No skin lesions.  Orthopedic:  Pain on palpation to the peroneal longus tendon as it courses way to the plantar aspect of the cuboid.  There is an ossicle present on the x-ray that could be the culprit.  Pain with inversion as well as resisted eversion.  No pain with dorsiflexion or plantarflexion.  Previous surgical scar noted along the course of the tendon.  Patient had a surgery done 15 years ago.   Radiographs: IMPRESSION: Mild insertional Achilles tendinosis without tear.  Mild degenerative change at the calcaneocuboid joint and articulation of the middle and medial cuneiforms. Assessment:   1. Peroneal tendinitis, right leg   2. 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  Right peroneal tendinitis/os peroneum syndrome -I explained to the patient the etiology of  peroneal tendinitis with a possible relationship for os peroneum.  Given the clinical findings of pain right as the peroneal longus tendon courses underneath the plantar aspect of the cuboid near the os peroneal ossicle.  I plan on using the previous incision. -MRI was reviewed again and discussed in great details with the patient which showed no significant finding of the peroneal tendons.  However given clinically her pain correlates right at the junction of peroneus longus tendon with an associated ossicle I believe patient will benefit from a  surgical intervention to debride the tendon with a possible tubularization or tenodesis if needed as well as excision of the ossicle.   -During my last visit I extensively discussed surgical procedure and outcomes with the patient however at that time patient wanted to think about the surgery and see if there was any improvement in pain over time.  She returns to me today without any improvement in pain and she would like to discuss the surgery further.  I extensively discussed my surgical goals as well as no guarantees were made as to the outcome of the surgery. -My goal here is to reduce the pain however given the this is a revisional surgery there might be more scar tissue formation that could also cause pain associated with it. -I believe the patient will benefit from right peroneal longus and brevis tendon repair with a possible tenodesis as versus tubularization. -I discussed with the patient my postop protocol that includes nonweightbearing.  Followed by transition into cam boot with weightbearing as tolerated and therefore into regular sneakers.  Patient states understanding -Informed surgical risk consent was reviewed and read aloud to the patient.  I reviewed the films.  I have discussed my findings with the patient in great detail.  I have discussed all risks including but not limited to infection, stiffness, scarring, limp, disability, deformity, damage to  blood vessels and nerves, numbness, poor healing, need for braces, arthritis, chronic pain, amputation, death.  All benefits and realistic expectations discussed in great detail.  I have made no promises as to the outcome.  I have provided realistic expectations.  I have offered the patient a 2nd opinion, which they have declined and assured me they preferred to proceed despite the risks  Reducible pes planus deformity -Orthotics were dispensed to the patient.  I explained to the patient of breaking in period.  Patient states understanding and she will start off slowly before going 100% with the orthotics.  If there are any issues I asked the patient to come and see me sooner.    No follow-ups on file.

## 2019-09-26 NOTE — Telephone Encounter (Signed)
DOS 10/22/19  REPAIR POST TIBIAL TENDON RT - 27659  [facility] EFFECTIVE DATE - 07/13/19  PLAN DEDUCTIBLE - $0.00  OUT OF POCKET - $4000 W/ $683.31 REMAINING  CO INSURANCE 60%  SPOKE TO [facility] AT [facility]-REF# [id number]. CODE 13244 DOES NOT REQUIRE AUTHORIZATION

## 2019-10-02 MED FILL — LISINOPRIL 10 MG TABS: 10 | 90 days supply | Qty: 90 | Fill #0

## 2019-10-02 MED FILL — metFORMIN HCL ER 500 MG TB2: 500 | 90 days supply | Qty: 180 | Fill #0

## 2019-10-02 MED FILL — CITALOPRAM HBR 10 MG TABLET: 10 | 90 days supply | Qty: 90 | Fill #1

## 2019-10-03 MED FILL — AMLODIPINE BESYLATE 10 MG T: 10 | 90 days supply | Qty: 90 | Fill #0

## 2019-10-03 MED FILL — buPROPion HCL ER (XL) 300 M: 300 | 30 days supply | Qty: 30 | Fill #3

## 2019-10-05 ENCOUNTER — Encounter: Payer: Self-pay | Admitting: Podiatry

## 2019-10-11 HISTORY — PX: REPAIR PERONEAL TENDONS ANKLE: SUR1201

## 2019-10-22 DIAGNOSIS — M65871 Other synovitis and tenosynovitis, right ankle and foot: Secondary | ICD-10-CM | POA: Diagnosis not present

## 2019-10-22 DIAGNOSIS — M7671 Peroneal tendinitis, right leg: Secondary | ICD-10-CM | POA: Diagnosis not present

## 2019-10-22 DIAGNOSIS — M25571 Pain in right ankle and joints of right foot: Secondary | ICD-10-CM | POA: Diagnosis not present

## 2019-10-22 DIAGNOSIS — I10 Essential (primary) hypertension: Secondary | ICD-10-CM | POA: Diagnosis not present

## 2019-10-22 DIAGNOSIS — M898X7 Other specified disorders of bone, ankle and foot: Secondary | ICD-10-CM | POA: Diagnosis not present

## 2019-10-22 DIAGNOSIS — M2570 Osteophyte, unspecified joint: Secondary | ICD-10-CM | POA: Diagnosis not present

## 2019-10-22 DIAGNOSIS — M89371 Hypertrophy of bone, right ankle and foot: Secondary | ICD-10-CM | POA: Diagnosis not present

## 2019-10-22 DIAGNOSIS — M25511 Pain in right shoulder: Secondary | ICD-10-CM | POA: Diagnosis not present

## 2019-10-22 DIAGNOSIS — G5761 Lesion of plantar nerve, right lower limb: Secondary | ICD-10-CM | POA: Diagnosis not present

## 2019-10-22 DIAGNOSIS — G5781 Other specified mononeuropathies of right lower limb: Secondary | ICD-10-CM | POA: Diagnosis not present

## 2019-10-22 MED ORDER — IBUPROFEN 800 MG PO TABS: 800.0000 mg | ORAL_TABLET | Freq: Four times a day (QID) | ORAL | 1 refills | Status: DC | PRN

## 2019-10-22 MED ORDER — OXYCODONE-ACETAMINOPHEN 10-325 MG PO TABS: 1.0000 | ORAL_TABLET | Freq: Four times a day (QID) | ORAL | 0 refills | Status: AC | PRN

## 2019-10-23 ENCOUNTER — Encounter: Payer: 59 | Admitting: Podiatry

## 2019-10-23 ENCOUNTER — Ambulatory Visit (INDEPENDENT_AMBULATORY_CARE_PROVIDER_SITE_OTHER): Payer: 59 | Admitting: Podiatry

## 2019-10-23 ENCOUNTER — Other Ambulatory Visit: Payer: Self-pay

## 2019-10-23 ENCOUNTER — Encounter: Payer: Self-pay | Admitting: Podiatry

## 2019-10-23 DIAGNOSIS — M7671 Peroneal tendinitis, right leg: Secondary | ICD-10-CM

## 2019-10-23 DIAGNOSIS — M79671 Pain in right foot: Secondary | ICD-10-CM

## 2019-10-23 DIAGNOSIS — Z9889 Other specified postprocedural states: Secondary | ICD-10-CM

## 2019-10-23 DIAGNOSIS — M7751 Other enthesopathy of right foot: Secondary | ICD-10-CM

## 2019-10-23 NOTE — Telephone Encounter (Signed)
I spoke with patient, she stated that her outer ace bandage is saturated and wet when touched and wanted to come in for dressing change.  She was put on schedule for Dr. Posey Pronto this afternoon for dressing change

## 2019-10-24 ENCOUNTER — Encounter: Payer: Self-pay | Admitting: Podiatry

## 2019-10-24 NOTE — Progress Notes (Signed)
Subjective:  Patient ID: Jacqueline Deleon, female    DOB: 11/23/1960,  MRN: [id number]  Chief Complaint  Patient presents with  . Foot Pain    pt is here due to here bandages bleeding through [facility] 10/22/19, sutures are still intact, pt is also waiting for bandages to be done as well.    DOS: 10/22/2019 Procedure: Right peroneal tendon repair with tarsal exostectomy with sural neuroma excision  59 y.o. female returns for post-op check.  Patient presents with complaints of bleeding through the bandages.  She her pain is well controlled on pain medication as well as nerve block.  She states her nerve/motor sensation is starting to come back.  She was just concerned because the bandages were soaking through the dressings and wanted to get evaluated to rule out any acute bleeding.  No clinical signs of infection.  Sutures are intact.  She has been nonweightbearing.  Review of Systems: Negative except as noted in the HPI. Denies N/V/F/Ch.  Past Medical History:  Diagnosis Date  . Allergic genetic state   . Arthritis   . 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 Breast mass 08/2006, 03/2009   left breast biopsy fibroadenoma (2008) right breast biopsy benign (2010)  . 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 Central serous retinopathy   . Chicken pox   . Depression   . Fatty liver   . Fatty liver   . Gastric polyps   . GERD (gastroesophageal reflux disease)   . Gestational diabetes   . Headache    migraines  . Heart murmur   . History of abnormal mammogram 08/2006, 01/2008, 03/2009  . Hypertension   . IBS (irritable bowel syndrome)   . Joint disorder    hx of left ankle tendonitis, bursitis, heel spur  . Obesity 2018   BMI 45  . Pelvic pain    adhesions and adenomyosis  . Rosacea     Current Outpatient Medications:  .  acetaminophen (TYLENOL) 325 MG tablet, Take 650 mg by mouth every 6 (six) hours as needed., Disp: , Rfl:  .   amLODipine (NORVASC) 10 MG tablet, Take 0.5 tablets (5 mg total) by mouth daily., Disp: 90 tablet, Rfl: 3 .  Ascorbic Acid (VITAMIN C) 1000 MG tablet, Take by mouth., Disp: , Rfl:  .  buPROPion (WELLBUTRIN XL) 300 MG 24 hr tablet, Take by mouth., Disp: , Rfl:  .  Camphor-Eucalyptus-Menthol (VICKS VAPORUB) 4.7-1.2-2.6 % OINT, , Disp: , Rfl:  .  citalopram (CELEXA) 10 MG tablet, Take by mouth., Disp: , Rfl:  .  clotrimazole (CLOTRIMAZOLE ANTI-FUNGAL) 1 % cream, Apply topically., Disp: , Rfl:  .  diphenhydrAMINE (BENADRYL) 25 MG tablet, Take by mouth., Disp: , Rfl:  .  hyoscyamine (LEVSIN, ANASPAZ) 0.125 MG tablet, , Disp: , Rfl: 3 .  ibuprofen (ADVIL) 800 MG tablet, Take 1 tablet (800 mg total) by mouth every 6 (six) hours as needed., Disp: 60 tablet, Rfl: 1 .  ibuprofen (ADVIL,MOTRIN) 200 MG tablet, Take 200 mg by mouth every 6 (six) hours as needed., Disp: , Rfl:  .  ketotifen (ZADITOR) 0.025 % ophthalmic solution, Apply to eye., Disp: , Rfl:  .  LACTOBACILLUS PROBIOTIC PO, Take by mouth., Disp: , Rfl:  .  lisinopril (PRINIVIL,ZESTRIL) 10 MG tablet, Take by mouth., Disp: , Rfl:  .  loratadine (CLARITIN) 10 MG tablet, Take 10 mg by mouth daily., Disp: ,  Rfl:  .  meclizine (ANTIVERT) 25 MG tablet, , Disp: , Rfl: 0 .  metFORMIN (GLUCOPHAGE-XR) 500 MG 24 hr tablet, , Disp: , Rfl:  .  metroNIDAZOLE (METROGEL) 0.75 % gel, , Disp: , Rfl:  .  minocycline (MINOCIN) 100 MG capsule, , Disp: , Rfl:  .  Multiple Vitamin (MULTIVITAMIN) tablet, Take 1 tablet by mouth daily., Disp: , Rfl:  .  omeprazole (PRILOSEC) 20 MG capsule, Take 20 mg by mouth daily., Disp: , Rfl:  .  oxyCODONE-acetaminophen (PERCOCET) 10-325 MG tablet, Take 1 tablet by mouth every 6 (six) hours as needed for up to 8 days for pain., Disp: 30 tablet, Rfl: 0 .  Probiotic Product ([employer] PROBIOTIC MAXIMUM STRENGTH) CAPS, Take by mouth., Disp: , Rfl:  .  TRILYTE 420 g solution, , Disp: , Rfl:   Social History   Tobacco Use  Smoking Status  Never Smoker  Smokeless Tobacco Never Used    Allergies  Allergen Reactions  . Kiwi Extract Itching    swelling swelling swelling  . Other Swelling    Grapes--itching  . Codeine Other (See Comments)    Felt funny.  . Amoxicillin Rash  . Erythromycin Rash  . Sulfa Antibiotics Rash  . Tape Rash    adhesives   Objective:  There were no vitals filed for this visit. There is no height or weight on file to calculate BMI. Constitutional Well developed. Well nourished.  Vascular Foot warm and well perfused. Capillary refill normal to all digits.   Neurologic Normal speech. Oriented to person, place, and time. Epicritic sensation to light touch grossly present bilaterally.  Dermatologic Skin healing well without signs of infection. Skin edges well coapted without signs of infection.  No active bleeding noted.  Orthopedic: Tenderness to palpation noted about the surgical site.   Radiographs: None Assessment:   1. Peroneal tendinitis, right leg   2. Os peroneum syndrome of right foot   3. Pain in right foot   4. Status post foot surgery    Plan:  Patient was evaluated and treated and all questions answered.  S/p foot surgery right -Progressing as expected post-operatively. -XR: None -WB Status: Nonweightbearing as tolerated in the right foot with a cam boot on and knee scooter -Sutures: Intact without any signs of dehiscence.  No clinical signs of infection noted. -Medications: None -Foot redressed.  No follow-ups on file.

## 2019-10-25 MED FILL — OMEPRAZOLE 20 MG CAP: 20 | 90 days supply | Qty: 180 | Fill #2

## 2019-10-29 ENCOUNTER — Encounter: Payer: 59 | Admitting: Podiatry

## 2019-10-30 ENCOUNTER — Encounter: Payer: Self-pay | Admitting: Podiatry

## 2019-10-30 ENCOUNTER — Other Ambulatory Visit: Payer: Self-pay

## 2019-10-30 ENCOUNTER — Ambulatory Visit (INDEPENDENT_AMBULATORY_CARE_PROVIDER_SITE_OTHER): Payer: 59

## 2019-10-30 ENCOUNTER — Ambulatory Visit (INDEPENDENT_AMBULATORY_CARE_PROVIDER_SITE_OTHER): Payer: 59 | Admitting: Podiatry

## 2019-10-30 ENCOUNTER — Encounter: Payer: 59 | Admitting: Podiatry

## 2019-10-30 DIAGNOSIS — Z9889 Other specified postprocedural states: Secondary | ICD-10-CM

## 2019-10-30 DIAGNOSIS — M7671 Peroneal tendinitis, right leg: Secondary | ICD-10-CM

## 2019-10-30 DIAGNOSIS — M7751 Other enthesopathy of right foot: Secondary | ICD-10-CM

## 2019-10-31 ENCOUNTER — Encounter: Payer: Self-pay | Admitting: Podiatry

## 2019-10-31 NOTE — Progress Notes (Signed)
Subjective:  Patient ID: Jacqueline Deleon, female    DOB: 1960/10/26,  MRN: [id number]  Chief Complaint  Patient presents with  . Routine Post Op    POV #1 DOS 10/22/19 REPAIR POST TIBIAL TENDON RT, pt is states that she is in pain, pt is well bandaged, and has been keeping foot dry, pt is concerned with swelling to the area    DOS: 10/22/2019 Procedure: Right peroneal tendon repair with tarsal exostectomy with sural neuroma excision  59 y.o. female returns for post-op check.  Patient is doing really well.  She states that there is no clinical signs of infection.  She has been ambulating with a cam boot on nonweightbearing with a knee scooter.  Sutures are intact.  Patient has been taking her pain meds.  Patient would like to discuss further by swelling.  Her pain scale 6 out of 10.  She denies any other acute complaints.  She has been resting it and icing it.  Review of Systems: Negative except as noted in the HPI. Denies N/V/F/Ch.  Past Medical History:  Diagnosis Date  . Allergic genetic state   . Arthritis   . 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 Breast mass 08/2006, 03/2009   left breast biopsy fibroadenoma (2008) right breast biopsy benign (2010)  . 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 Central serous retinopathy   . Chicken pox   . Depression   . Fatty liver   . Fatty liver   . Gastric polyps   . GERD (gastroesophageal reflux disease)   . Gestational diabetes   . Headache    migraines  . Heart murmur   . History of abnormal mammogram 08/2006, 01/2008, 03/2009  . Hypertension   . IBS (irritable bowel syndrome)   . Joint disorder    hx of left ankle tendonitis, bursitis, heel spur  . Obesity 2018   BMI 45  . Pelvic pain    adhesions and adenomyosis  . Rosacea     Current Outpatient Medications:  .  acetaminophen (TYLENOL) 325 MG tablet, Take 650 mg by mouth every 6 (six) hours as needed., Disp: , Rfl:  .   amLODipine (NORVASC) 10 MG tablet, Take 0.5 tablets (5 mg total) by mouth daily., Disp: 90 tablet, Rfl: 3 .  Ascorbic Acid (VITAMIN C) 1000 MG tablet, Take by mouth., Disp: , Rfl:  .  buPROPion (WELLBUTRIN XL) 300 MG 24 hr tablet, Take by mouth., Disp: , Rfl:  .  Camphor-Eucalyptus-Menthol (VICKS VAPORUB) 4.7-1.2-2.6 % OINT, , Disp: , Rfl:  .  citalopram (CELEXA) 10 MG tablet, Take by mouth., Disp: , Rfl:  .  clotrimazole (CLOTRIMAZOLE ANTI-FUNGAL) 1 % cream, Apply topically., Disp: , Rfl:  .  diphenhydrAMINE (BENADRYL) 25 MG tablet, Take by mouth., Disp: , Rfl:  .  hyoscyamine (LEVSIN, ANASPAZ) 0.125 MG tablet, , Disp: , Rfl: 3 .  ibuprofen (ADVIL) 800 MG tablet, Take 1 tablet (800 mg total) by mouth every 6 (six) hours as needed., Disp: 60 tablet, Rfl: 1 .  ibuprofen (ADVIL,MOTRIN) 200 MG tablet, Take 200 mg by mouth every 6 (six) hours as needed., Disp: , Rfl:  .  ketotifen (ZADITOR) 0.025 % ophthalmic solution, Apply to eye., Disp: , Rfl:  .  LACTOBACILLUS PROBIOTIC PO, Take by mouth., Disp: , Rfl:  .  lisinopril (PRINIVIL,ZESTRIL) 10 MG tablet, Take by mouth., Disp: , Rfl:  .  loratadine (  CLARITIN) 10 MG tablet, Take 10 mg by mouth daily., Disp: , Rfl:  .  meclizine (ANTIVERT) 25 MG tablet, , Disp: , Rfl: 0 .  metFORMIN (GLUCOPHAGE-XR) 500 MG 24 hr tablet, , Disp: , Rfl:  .  metroNIDAZOLE (METROGEL) 0.75 % gel, , Disp: , Rfl:  .  minocycline (MINOCIN) 100 MG capsule, , Disp: , Rfl:  .  Multiple Vitamin (MULTIVITAMIN) tablet, Take 1 tablet by mouth daily., Disp: , Rfl:  .  omeprazole (PRILOSEC) 20 MG capsule, Take 20 mg by mouth daily., Disp: , Rfl:  .  Probiotic Product ([employer] PROBIOTIC MAXIMUM STRENGTH) CAPS, Take by mouth., Disp: , Rfl:  .  TRILYTE 420 g solution, , Disp: , Rfl:   Social History   Tobacco Use  Smoking Status Never Smoker  Smokeless Tobacco Never Used    Allergies  Allergen Reactions  . Kiwi Extract Itching    swelling swelling swelling  . Other Swelling     Grapes--itching  . Codeine Other (See Comments)    Felt funny.  . Amoxicillin Rash  . Erythromycin Rash  . Sulfa Antibiotics Rash  . Tape Rash    adhesives   Objective:  There were no vitals filed for this visit. There is no height or weight on file to calculate BMI. Constitutional Well developed. Well nourished.  Vascular Foot warm and well perfused. Capillary refill normal to all digits.   Neurologic Normal speech. Oriented to person, place, and time. Epicritic sensation to light touch grossly present bilaterally.  Dermatologic Skin healing well without signs of infection. Skin edges well coapted without signs of infection.  No active bleeding noted.  Orthopedic: Tenderness to palpation noted about the surgical site.   Radiographs: None Assessment:   1. Peroneal tendinitis, right leg   2. Status post foot surgery   3. Os peroneum syndrome of right foot    Plan:  Patient was evaluated and treated and all questions answered.  S/p foot surgery right -Progressing as expected post-operatively. -XR: None -WB Status: Nonweightbearing as tolerated in the right foot with a cam boot on and knee scooter.  I will plan on transitioning her to weightbearing as tolerated in cam boot with physical therapy during next clinical visit -Sutures: Intact without any signs of dehiscence.  No clinical signs of infection noted.  I will plan on taking the sutures out during next visit. -Medications: None -Foot redressed.  No follow-ups on file.

## 2019-11-02 MED FILL — buPROPion HCL ER (XL) 300 M: 300 | 30 days supply | Qty: 30 | Fill #4

## 2019-11-06 ENCOUNTER — Other Ambulatory Visit: Payer: Self-pay

## 2019-11-06 ENCOUNTER — Ambulatory Visit (INDEPENDENT_AMBULATORY_CARE_PROVIDER_SITE_OTHER): Payer: 59 | Admitting: Podiatry

## 2019-11-06 DIAGNOSIS — M7671 Peroneal tendinitis, right leg: Secondary | ICD-10-CM

## 2019-11-06 DIAGNOSIS — Z9889 Other specified postprocedural states: Secondary | ICD-10-CM

## 2019-11-07 ENCOUNTER — Encounter: Payer: Self-pay | Admitting: Podiatry

## 2019-11-07 NOTE — Progress Notes (Signed)
Subjective:  Patient ID: Jacqueline Deleon, female    DOB: October 09, 1960,  MRN: [id number]  Chief Complaint  Patient presents with  . Routine Post Op     POV #2 DOS 10/22/19 REPAIR POST TIBIAL TENDON RT    DOS: 10/22/2019 Procedure: Right peroneal tendon repair with tarsal exostectomy with sural neuroma excision  59 y.o. female returns for post-op check.  Patient is doing really well.  She states that there is no clinical signs of infection.  She has been ambulating with a cam boot on nonweightbearing with a knee scooter.  Sutures are intact.  Patient has been taking her pain meds.  Swelling is getting little bit better.  She has been elevating aggressively..  Her pain scale for out of 10.  She denies any other acute complaints.  She has been resting it and icing it.  Review of Systems: Negative except as noted in the HPI. Denies N/V/F/Ch.  Past Medical History:  Diagnosis Date  . Allergic genetic state   . Arthritis   . 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 Breast mass 08/2006, 03/2009   left breast biopsy fibroadenoma (2008) right breast biopsy benign (2010)  . 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 Central serous retinopathy   . Chicken pox   . Depression   . Fatty liver   . Fatty liver   . Gastric polyps   . GERD (gastroesophageal reflux disease)   . Gestational diabetes   . Headache    migraines  . Heart murmur   . History of abnormal mammogram 08/2006, 01/2008, 03/2009  . Hypertension   . IBS (irritable bowel syndrome)   . Joint disorder    hx of left ankle tendonitis, bursitis, heel spur  . Obesity 2018   BMI 45  . Pelvic pain    adhesions and adenomyosis  . Rosacea     Current Outpatient Medications:  .  acetaminophen (TYLENOL) 325 MG tablet, Take 650 mg by mouth every 6 (six) hours as needed., Disp: , Rfl:  .  amLODipine (NORVASC) 10 MG tablet, Take 0.5 tablets (5 mg total) by mouth daily., Disp: 90 tablet, Rfl: 3  .  Ascorbic Acid (VITAMIN C) 1000 MG tablet, Take by mouth., Disp: , Rfl:  .  buPROPion (WELLBUTRIN XL) 300 MG 24 hr tablet, Take by mouth., Disp: , Rfl:  .  Camphor-Eucalyptus-Menthol (VICKS VAPORUB) 4.7-1.2-2.6 % OINT, , Disp: , Rfl:  .  citalopram (CELEXA) 10 MG tablet, Take by mouth., Disp: , Rfl:  .  clotrimazole (CLOTRIMAZOLE ANTI-FUNGAL) 1 % cream, Apply topically., Disp: , Rfl:  .  diphenhydrAMINE (BENADRYL) 25 MG tablet, Take by mouth., Disp: , Rfl:  .  hyoscyamine (LEVSIN, ANASPAZ) 0.125 MG tablet, , Disp: , Rfl: 3 .  ibuprofen (ADVIL) 800 MG tablet, Take 1 tablet (800 mg total) by mouth every 6 (six) hours as needed., Disp: 60 tablet, Rfl: 1 .  ibuprofen (ADVIL,MOTRIN) 200 MG tablet, Take 200 mg by mouth every 6 (six) hours as needed., Disp: , Rfl:  .  ketotifen (ZADITOR) 0.025 % ophthalmic solution, Apply to eye., Disp: , Rfl:  .  LACTOBACILLUS PROBIOTIC PO, Take by mouth., Disp: , Rfl:  .  lisinopril (PRINIVIL,ZESTRIL) 10 MG tablet, Take by mouth., Disp: , Rfl:  .  loratadine (CLARITIN) 10 MG tablet, Take 10 mg by mouth daily., Disp: , Rfl:  .  meclizine (ANTIVERT) 25 MG tablet, ,  Disp: , Rfl: 0 .  metFORMIN (GLUCOPHAGE-XR) 500 MG 24 hr tablet, , Disp: , Rfl:  .  metroNIDAZOLE (METROGEL) 0.75 % gel, , Disp: , Rfl:  .  minocycline (MINOCIN) 100 MG capsule, , Disp: , Rfl:  .  Multiple Vitamin (MULTIVITAMIN) tablet, Take 1 tablet by mouth daily., Disp: , Rfl:  .  omeprazole (PRILOSEC) 20 MG capsule, Take 20 mg by mouth daily., Disp: , Rfl:  .  Probiotic Product ([employer] PROBIOTIC MAXIMUM STRENGTH) CAPS, Take by mouth., Disp: , Rfl:  .  TRILYTE 420 g solution, , Disp: , Rfl:   Social History   Tobacco Use  Smoking Status Never Smoker  Smokeless Tobacco Never Used    Allergies  Allergen Reactions  . Kiwi Extract Itching    swelling swelling swelling  . Other Swelling    Grapes--itching  . Codeine Other (See Comments)    Felt funny.  . Amoxicillin Rash  . Erythromycin Rash   . Sulfa Antibiotics Rash  . Tape Rash    adhesives   Objective:  There were no vitals filed for this visit. There is no height or weight on file to calculate BMI. Constitutional Well developed. Well nourished.  Vascular Foot warm and well perfused. Capillary refill normal to all digits.   Neurologic Normal speech. Oriented to person, place, and time. Epicritic sensation to light touch grossly present bilaterally.  Dermatologic Skin healing well without signs of infection. Skin edges well coapted without signs of infection.  No active bleeding noted.  Orthopedic: Tenderness to palpation noted about the surgical site.   Radiographs: None Assessment:   No diagnosis found. Plan:  Patient was evaluated and treated and all questions answered.  S/p foot surgery right -Progressing as expected post-operatively. -XR: None -WB Status: Nonweightbearing as tolerated in the right foot with a cam boot on and knee scooter.  I will plan on transitioning her to weightbearing as tolerated in cam boot with physical therapy during next clinical visit -Sutures: Intact without any signs of dehiscence.  No clinical signs of infection noted.  I will plan on taking the sutures out during next visit. -Medications: None -Foot redressed.  No follow-ups on file.

## 2019-11-13 ENCOUNTER — Encounter: Payer: 59 | Admitting: Podiatry

## 2019-11-15 ENCOUNTER — Other Ambulatory Visit: Payer: Self-pay

## 2019-11-15 ENCOUNTER — Ambulatory Visit (INDEPENDENT_AMBULATORY_CARE_PROVIDER_SITE_OTHER): Payer: 59 | Admitting: Podiatry

## 2019-11-15 DIAGNOSIS — Z760 Encounter for issue of repeat prescription: Secondary | ICD-10-CM | POA: Diagnosis not present

## 2019-11-15 DIAGNOSIS — M7671 Peroneal tendinitis, right leg: Secondary | ICD-10-CM

## 2019-11-15 DIAGNOSIS — M7751 Other enthesopathy of right foot: Secondary | ICD-10-CM

## 2019-11-15 DIAGNOSIS — Z9889 Other specified postprocedural states: Secondary | ICD-10-CM

## 2019-11-16 ENCOUNTER — Encounter: Payer: Self-pay | Admitting: Podiatry

## 2019-11-16 NOTE — Progress Notes (Signed)
Subjective:  Patient ID: Jacqueline Deleon, female    DOB: 1960-12-12,  MRN: [id number]  Chief Complaint  Patient presents with  . Routine Post Op    POV #3 DOS 10/22/19 REPAIR POST TIBIAL TENDON RT    DOS: 10/22/2019 Procedure: Right peroneal tendon repair with tarsal exostectomy with sural neuroma excision  59 y.o. female returns for post-op check.  Patient is doing really well.  She has been keeping it bandaged.  She has been nonweightbearing to the right side with a knee scooter.  She denies any other acute complaints.  She would like to have the stitches taken out today.  She sometimes get the shooting pain which tends to be completely normal.  She denies any other acute complaints.  T.  Review of Systems: Negative except as noted in the HPI. Denies N/V/F/Ch.  Past Medical History:  Diagnosis Date  . Allergic genetic state   . Arthritis   . 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 Breast mass 08/2006, 03/2009   left breast biopsy fibroadenoma (2008) right breast biopsy benign (2010)  . 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 Central serous retinopathy   . Chicken pox   . Depression   . Fatty liver   . Fatty liver   . Gastric polyps   . GERD (gastroesophageal reflux disease)   . Gestational diabetes   . Headache    migraines  . Heart murmur   . History of abnormal mammogram 08/2006, 01/2008, 03/2009  . Hypertension   . IBS (irritable bowel syndrome)   . Joint disorder    hx of left ankle tendonitis, bursitis, heel spur  . Obesity 2018   BMI 45  . Pelvic pain    adhesions and adenomyosis  . Rosacea     Current Outpatient Medications:  .  acetaminophen (TYLENOL) 325 MG tablet, Take 650 mg by mouth every 6 (six) hours as needed., Disp: , Rfl:  .  amLODipine (NORVASC) 10 MG tablet, Take 0.5 tablets (5 mg total) by mouth daily., Disp: 90 tablet, Rfl: 3 .  Ascorbic Acid (VITAMIN C) 1000 MG tablet, Take by mouth., Disp: ,  Rfl:  .  buPROPion (WELLBUTRIN XL) 300 MG 24 hr tablet, Take by mouth., Disp: , Rfl:  .  Camphor-Eucalyptus-Menthol (VICKS VAPORUB) 4.7-1.2-2.6 % OINT, , Disp: , Rfl:  .  citalopram (CELEXA) 10 MG tablet, Take by mouth., Disp: , Rfl:  .  clotrimazole (CLOTRIMAZOLE ANTI-FUNGAL) 1 % cream, Apply topically., Disp: , Rfl:  .  diphenhydrAMINE (BENADRYL) 25 MG tablet, Take by mouth., Disp: , Rfl:  .  hyoscyamine (LEVSIN, ANASPAZ) 0.125 MG tablet, , Disp: , Rfl: 3 .  ibuprofen (ADVIL) 800 MG tablet, Take 1 tablet (800 mg total) by mouth every 6 (six) hours as needed., Disp: 60 tablet, Rfl: 1 .  ibuprofen (ADVIL,MOTRIN) 200 MG tablet, Take 200 mg by mouth every 6 (six) hours as needed., Disp: , Rfl:  .  ketotifen (ZADITOR) 0.025 % ophthalmic solution, Apply to eye., Disp: , Rfl:  .  LACTOBACILLUS PROBIOTIC PO, Take by mouth., Disp: , Rfl:  .  lisinopril (PRINIVIL,ZESTRIL) 10 MG tablet, Take by mouth., Disp: , Rfl:  .  loratadine (CLARITIN) 10 MG tablet, Take 10 mg by mouth daily., Disp: , Rfl:  .  meclizine (ANTIVERT) 25 MG tablet, , Disp: , Rfl: 0 .  metFORMIN (GLUCOPHAGE-XR) 500 MG 24 hr tablet, , Disp: ,  Rfl:  .  metroNIDAZOLE (METROGEL) 0.75 % gel, , Disp: , Rfl:  .  minocycline (MINOCIN) 100 MG capsule, , Disp: , Rfl:  .  Multiple Vitamin (MULTIVITAMIN) tablet, Take 1 tablet by mouth daily., Disp: , Rfl:  .  omeprazole (PRILOSEC) 20 MG capsule, Take 20 mg by mouth daily., Disp: , Rfl:  .  Probiotic Product ([employer] PROBIOTIC MAXIMUM STRENGTH) CAPS, Take by mouth., Disp: , Rfl:  .  TRILYTE 420 g solution, , Disp: , Rfl:   Social History   Tobacco Use  Smoking Status Never Smoker  Smokeless Tobacco Never Used    Allergies  Allergen Reactions  . Kiwi Extract Itching    swelling swelling swelling  . Other Swelling    Grapes--itching  . Codeine Other (See Comments)    Felt funny.  . Amoxicillin Rash  . Erythromycin Rash  . Sulfa Antibiotics Rash  . Tape Rash    adhesives   Objective:   There were no vitals filed for this visit. There is no height or weight on file to calculate BMI. Constitutional Well developed. Well nourished.  Vascular Foot warm and well perfused. Capillary refill normal to all digits.   Neurologic Normal speech. Oriented to person, place, and time. Epicritic sensation to light touch grossly present bilaterally.  Dermatologic  skin completely epithelialized.  No signs of dehiscence noted.  No clinical signs of infection noted.  Good range of motion at the ankle joint.  Mild pain with eversion of the foot resisted active and passive.  Mild pain with inversion and plantarflexion of the foot for both active and passive range of motion.  Orthopedic:  Mild tenderness to palpation noted about the surgical site.   Radiographs: None Assessment:   1. Status post foot surgery   2. 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  S/p foot surgery right -Progressing as expected post-operatively. -XR: None -WB Status: Given transitioning from nonweightbearing to the weightbearing as tolerated with a cam boot on. -Sutures: Sutures were removed.  No dehiscence noted.  No clinical signs of infection noted. -Medications: None -Foot redressed. -I discussed with the patient that she can do home physical therapy for now however if she is not able to properly improve the range of motion as well as improve her general deconditioning from being nonweightbearing I will send her to physical therapy.  Patient states understanding. -Stretching exercises were shown to the patient and extensively discussed.  No follow-ups on file.

## 2019-11-19 ENCOUNTER — Encounter: Payer: Self-pay | Admitting: Podiatry

## 2019-11-19 ENCOUNTER — Other Ambulatory Visit: Payer: Self-pay | Admitting: Podiatry

## 2019-11-27 DIAGNOSIS — M9904 Segmental and somatic dysfunction of sacral region: Secondary | ICD-10-CM | POA: Diagnosis not present

## 2019-11-27 DIAGNOSIS — M9902 Segmental and somatic dysfunction of thoracic region: Secondary | ICD-10-CM | POA: Diagnosis not present

## 2019-11-27 DIAGNOSIS — R519 Headache, unspecified: Secondary | ICD-10-CM | POA: Diagnosis not present

## 2019-11-27 DIAGNOSIS — M5412 Radiculopathy, cervical region: Secondary | ICD-10-CM | POA: Diagnosis not present

## 2019-11-27 DIAGNOSIS — M9903 Segmental and somatic dysfunction of lumbar region: Secondary | ICD-10-CM | POA: Diagnosis not present

## 2019-11-27 DIAGNOSIS — M9901 Segmental and somatic dysfunction of cervical region: Secondary | ICD-10-CM | POA: Diagnosis not present

## 2019-11-27 DIAGNOSIS — M545 Low back pain: Secondary | ICD-10-CM | POA: Diagnosis not present

## 2019-11-27 DIAGNOSIS — M6080 Other myositis, unspecified site: Secondary | ICD-10-CM | POA: Diagnosis not present

## 2019-11-27 DIAGNOSIS — M542 Cervicalgia: Secondary | ICD-10-CM | POA: Diagnosis not present

## 2019-12-03 MED FILL — buPROPion HCL ER (XL) 300 M: 300 | 30 days supply | Qty: 30 | Fill #5

## 2019-12-20 ENCOUNTER — Ambulatory Visit (INDEPENDENT_AMBULATORY_CARE_PROVIDER_SITE_OTHER): Payer: 59 | Admitting: Podiatry

## 2019-12-20 ENCOUNTER — Other Ambulatory Visit: Payer: Self-pay

## 2019-12-20 DIAGNOSIS — M7671 Peroneal tendinitis, right leg: Secondary | ICD-10-CM

## 2019-12-20 DIAGNOSIS — Z9889 Other specified postprocedural states: Secondary | ICD-10-CM

## 2019-12-21 ENCOUNTER — Encounter: Payer: Self-pay | Admitting: Podiatry

## 2019-12-21 NOTE — Progress Notes (Signed)
Subjective:  Patient ID: Jacqueline Deleon, female    DOB: 18-Jun-1961,  MRN: [id number]  Chief Complaint  Patient presents with  . Foot Pain    pt is here for a f/u of right foot pain, pt states that she is doing alot better since the last time she was here, and is looking to get clearance to go back to work    DOS: 10/22/2019 Procedure: Right peroneal tendon repair with tarsal exostectomy with sural neuroma excision  59 y.o. female returns for post-op check.  Patient is doing really well.  She has transition to regular sneakers.  Her skin has completely reepithelialized.  She does not have any acute complaints.  She is ready to go back to work..  Review of Systems: Negative except as noted in the HPI. Denies N/V/F/Ch.  Past Medical History:  Diagnosis Date  . Allergic genetic state   . Arthritis   . 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 Breast mass 08/2006, 03/2009   left breast biopsy fibroadenoma (2008) right breast biopsy benign (2010)  . 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 Central serous retinopathy   . Chicken pox   . Depression   . Fatty liver   . Fatty liver   . Gastric polyps   . GERD (gastroesophageal reflux disease)   . Gestational diabetes   . Headache    migraines  . Heart murmur   . History of abnormal mammogram 08/2006, 01/2008, 03/2009  . Hypertension   . IBS (irritable bowel syndrome)   . Joint disorder    hx of left ankle tendonitis, bursitis, heel spur  . Obesity 2018   BMI 45  . Pelvic pain    adhesions and adenomyosis  . Rosacea     Current Outpatient Medications:  .  acetaminophen (TYLENOL) 325 MG tablet, Take 650 mg by mouth every 6 (six) hours as needed., Disp: , Rfl:  .  amLODipine (NORVASC) 10 MG tablet, Take 0.5 tablets (5 mg total) by mouth daily., Disp: 90 tablet, Rfl: 3 .  Ascorbic Acid (VITAMIN C) 1000 MG tablet, Take by mouth., Disp: , Rfl:  .  buPROPion (WELLBUTRIN XL) 300 MG 24 hr  tablet, Take by mouth., Disp: , Rfl:  .  Camphor-Eucalyptus-Menthol (VICKS VAPORUB) 4.7-1.2-2.6 % OINT, , Disp: , Rfl:  .  citalopram (CELEXA) 10 MG tablet, Take by mouth., Disp: , Rfl:  .  clotrimazole (CLOTRIMAZOLE ANTI-FUNGAL) 1 % cream, Apply topically., Disp: , Rfl:  .  diphenhydrAMINE (BENADRYL) 25 MG tablet, Take by mouth., Disp: , Rfl:  .  hyoscyamine (LEVSIN, ANASPAZ) 0.125 MG tablet, , Disp: , Rfl: 3 .  ibuprofen (ADVIL) 800 MG tablet, TAKE 1 TABLET BY MOUTH EVERY 6 HOURS AS NEEDED., Disp: 60 tablet, Rfl: 1 .  ibuprofen (ADVIL,MOTRIN) 200 MG tablet, Take 200 mg by mouth every 6 (six) hours as needed., Disp: , Rfl:  .  ketotifen (ZADITOR) 0.025 % ophthalmic solution, Apply to eye., Disp: , Rfl:  .  LACTOBACILLUS PROBIOTIC PO, Take by mouth., Disp: , Rfl:  .  lisinopril (PRINIVIL,ZESTRIL) 10 MG tablet, Take by mouth., Disp: , Rfl:  .  loratadine (CLARITIN) 10 MG tablet, Take 10 mg by mouth daily., Disp: , Rfl:  .  meclizine (ANTIVERT) 25 MG tablet, , Disp: , Rfl: 0 .  metFORMIN (GLUCOPHAGE-XR) 500 MG 24 hr tablet, , Disp: , Rfl:  .  metroNIDAZOLE (METROGEL) 0.75 %  gel, , Disp: , Rfl:  .  minocycline (MINOCIN) 100 MG capsule, , Disp: , Rfl:  .  Multiple Vitamin (MULTIVITAMIN) tablet, Take 1 tablet by mouth daily., Disp: , Rfl:  .  omeprazole (PRILOSEC) 20 MG capsule, Take 20 mg by mouth daily., Disp: , Rfl:  .  Probiotic Product ([employer] PROBIOTIC MAXIMUM STRENGTH) CAPS, Take by mouth., Disp: , Rfl:  .  TRILYTE 420 g solution, , Disp: , Rfl:   Social History   Tobacco Use  Smoking Status Never Smoker  Smokeless Tobacco Never Used    Allergies  Allergen Reactions  . Kiwi Extract Itching    swelling swelling swelling  . Other Swelling    Grapes--itching  . Codeine Other (See Comments)    Felt funny.  . Amoxicillin Rash  . Erythromycin Rash  . Sulfa Antibiotics Rash  . Tape Rash    adhesives   Objective:  There were no vitals filed for this visit. There is no height or  weight on file to calculate BMI. Constitutional Well developed. Well nourished.  Vascular Foot warm and well perfused. Capillary refill normal to all digits.   Neurologic Normal speech. Oriented to person, place, and time. Epicritic sensation to light touch grossly present bilaterally.  Dermatologic  skin completely epithelialized.  No signs of dehiscence noted.  No clinical signs of infection noted.  Good range of motion noted at the ankle joint.  No pain with eversion of the foot resisted and active and passive.  No pain with inversion and plantarflexion of the foot both active and passive range of motion.  Good manual muscle strength noted of all compartment 5 out of 5.  Orthopedic:  No tenderness to palpation noted about the surgical site.   Radiographs: None Assessment:   1. Status post foot surgery   2. Peroneal tendinitis, right leg    Plan:  Patient was evaluated and treated and all questions answered.  S/p foot surgery right -Progressing as expected post-operatively. -XR: None -WB Status: Weightbearing as tolerated in regular sneakers.. -Sutures: None -Medications: None -Foot redressed. -Clinically patient's pain has resolved.  At this time patient will begin returning to work without any reservation. -Patient is officially discharged from my care and I have asked her if any foot and ankle issues arise in the future come back and see me.  She states understanding  No follow-ups on file.

## 2019-12-25 DIAGNOSIS — M9903 Segmental and somatic dysfunction of lumbar region: Secondary | ICD-10-CM | POA: Diagnosis not present

## 2019-12-25 DIAGNOSIS — M9901 Segmental and somatic dysfunction of cervical region: Secondary | ICD-10-CM | POA: Diagnosis not present

## 2019-12-25 DIAGNOSIS — M9902 Segmental and somatic dysfunction of thoracic region: Secondary | ICD-10-CM | POA: Diagnosis not present

## 2019-12-25 DIAGNOSIS — M6080 Other myositis, unspecified site: Secondary | ICD-10-CM | POA: Diagnosis not present

## 2019-12-25 DIAGNOSIS — M545 Low back pain: Secondary | ICD-10-CM | POA: Diagnosis not present

## 2019-12-25 DIAGNOSIS — M542 Cervicalgia: Secondary | ICD-10-CM | POA: Diagnosis not present

## 2019-12-25 DIAGNOSIS — R519 Headache, unspecified: Secondary | ICD-10-CM | POA: Diagnosis not present

## 2019-12-25 DIAGNOSIS — M5412 Radiculopathy, cervical region: Secondary | ICD-10-CM | POA: Diagnosis not present

## 2019-12-25 DIAGNOSIS — M9904 Segmental and somatic dysfunction of sacral region: Secondary | ICD-10-CM | POA: Diagnosis not present

## 2019-12-25 MED FILL — LISINOPRIL 10 MG TABS: 10 | 90 days supply | Qty: 90 | Fill #1

## 2019-12-25 MED FILL — metFORMIN HCL ER 500 MG TB2: 500 | 90 days supply | Qty: 180 | Fill #1

## 2019-12-26 MED FILL — AMLODIPINE BESYLATE 10 MG T: 10 | 90 days supply | Qty: 90 | Fill #1

## 2019-12-28 DIAGNOSIS — M79676 Pain in unspecified toe(s): Secondary | ICD-10-CM

## 2020-01-01 MED FILL — buPROPion HCL ER (XL) 300 M: 300 | 90 days supply | Qty: 90 | Fill #0

## 2020-01-17 DIAGNOSIS — M6080 Other myositis, unspecified site: Secondary | ICD-10-CM | POA: Diagnosis not present

## 2020-01-17 DIAGNOSIS — M542 Cervicalgia: Secondary | ICD-10-CM | POA: Diagnosis not present

## 2020-01-17 DIAGNOSIS — M545 Low back pain: Secondary | ICD-10-CM | POA: Diagnosis not present

## 2020-01-17 DIAGNOSIS — M9903 Segmental and somatic dysfunction of lumbar region: Secondary | ICD-10-CM | POA: Diagnosis not present

## 2020-01-17 DIAGNOSIS — M9904 Segmental and somatic dysfunction of sacral region: Secondary | ICD-10-CM | POA: Diagnosis not present

## 2020-01-17 DIAGNOSIS — M9902 Segmental and somatic dysfunction of thoracic region: Secondary | ICD-10-CM | POA: Diagnosis not present

## 2020-01-17 DIAGNOSIS — M5412 Radiculopathy, cervical region: Secondary | ICD-10-CM | POA: Diagnosis not present

## 2020-01-17 DIAGNOSIS — R519 Headache, unspecified: Secondary | ICD-10-CM | POA: Diagnosis not present

## 2020-01-17 DIAGNOSIS — M9901 Segmental and somatic dysfunction of cervical region: Secondary | ICD-10-CM | POA: Diagnosis not present

## 2020-01-23 ENCOUNTER — Other Ambulatory Visit: Payer: Self-pay

## 2020-01-23 ENCOUNTER — Ambulatory Visit (INDEPENDENT_AMBULATORY_CARE_PROVIDER_SITE_OTHER): Payer: 59 | Admitting: Certified Nurse Midwife

## 2020-01-23 ENCOUNTER — Encounter: Payer: Self-pay | Admitting: Certified Nurse Midwife

## 2020-01-23 ENCOUNTER — Other Ambulatory Visit (HOSPITAL_COMMUNITY)
Admission: RE | Admit: 2020-01-23 | Discharge: 2020-01-23 | Disposition: A | Discharge status: Home / Self Care | Payer: 59 | Source: Ambulatory Visit | Attending: Certified Nurse Midwife | Admitting: Certified Nurse Midwife

## 2020-01-23 VITALS — BP 124/74 | Ht 63.0 in | Wt 259.0 lb

## 2020-01-23 DIAGNOSIS — Z853 Personal history of malignant neoplasm of breast: Secondary | ICD-10-CM | POA: Diagnosis not present

## 2020-01-23 DIAGNOSIS — Z1231 Encounter for screening mammogram for malignant neoplasm of breast: Secondary | ICD-10-CM

## 2020-01-23 DIAGNOSIS — Z01419 Encounter for gynecological examination (general) (routine) without abnormal findings: Secondary | ICD-10-CM | POA: Diagnosis not present

## 2020-01-23 DIAGNOSIS — Z124 Encounter for screening for malignant neoplasm of cervix: Secondary | ICD-10-CM | POA: Diagnosis not present

## 2020-01-23 DIAGNOSIS — Z13820 Encounter for screening for osteoporosis: Secondary | ICD-10-CM | POA: Diagnosis not present

## 2020-01-23 MED FILL — OMEPRAZOLE 20 MG CAP: 20 | 90 days supply | Qty: 180 | Fill #3

## 2020-01-23 NOTE — Progress Notes (Signed)
Gynecology Annual Exam  PCP: Derinda Late, MD  Chief Complaint:  Chief Complaint  Patient presents with  . Gynecologic Exam    annual exam    History of Present Illness:Jacqueline Deleon is a 59 year old Caucasian/White female , G 1 P 1 0 0 1 , s/p supracervical hysterctomy, who presents for an annual exam. Has occasional hot flashes.   Has not had any further spotting since October 2016. History of spotting after her Tamoxifen was discontinued on Aug 11, 2013. She is a 12 year breast cancer survivor. She has occasional hot flashes. Her medical history is also significant for obesity, esophageal reflux, hypertension, migraine headaches, rosacea, nonalcoholic fatty liver, prediabetes, depression, basal cell carcinoma (left leg) and central serous retinopathy.     Since her last annual GYN exam dated 01/22/2019, she has had surgery on her right foot for a right peroneal tendon repair with tarsal exostectomy and with sural neuroma excision. She is back to work and is weight bearing with minimal discomfort. She is sexually active. She does not have vaginal dryness.   Her most recent pap smear was obtained 01/22/2019 and was NIL.  Her most recent bilateral mammogram obtained on 03/28/2019 was normal and revealed no significant changes. There is a positive history of breast cancer in her mother and more recently her maternal aunt.. Genetic testing has been done. She tested negative for BRCA 1 and negative for BRCA 2. There is no family history of ovarian cancer. The patient does do monthly self breast exams. Has not had any change in her breast exams. She had a colonoscopy in 02/12/2019 that was normal. Her next colonoscopy is due in 5 years due to her mother's history of colon cancer  A DEXA scan is not applicable for this patient.  The patient does not smoke.  The patient does not drink alcohol.  The patient does not use illegal drugs.  The patient has been doing stretches and just began  going to [employer] The patient does get adequate calcium in her diet.  She had a recent cholesterol screen 08/24/2019 that was normal.     Review of Systems: Review of Systems  Constitutional: Positive for malaise/fatigue. Negative for chills, fever and weight loss.  HENT: Negative for congestion, sinus pain and sore throat.   Eyes: Negative for blurred vision and pain.  Respiratory: Negative for hemoptysis, shortness of breath and wheezing.   Cardiovascular: Negative for chest pain, palpitations and leg swelling.  Gastrointestinal: Negative for abdominal pain, blood in stool, diarrhea, heartburn, nausea and vomiting.  Genitourinary: Negative for dysuria, frequency, hematuria and urgency.  Musculoskeletal: Negative for back pain, joint pain and myalgias.  Skin: Negative for itching and rash.  Neurological: Positive for headaches. Negative for dizziness and tingling.  Endo/Heme/Allergies: Negative for environmental allergies and polydipsia. Does not bruise/bleed easily.       Negative for hirsutism   Psychiatric/Behavioral: Negative for depression. The patient is not nervous/anxious and does not have insomnia.     Past Medical History:  Past Medical History:  Diagnosis Date  . Allergic genetic state   . Arthritis   . 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 Breast mass 08/2006, 03/2009   left breast biopsy fibroadenoma (2008) right breast biopsy benign (2010)  . 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 Central serous retinopathy   . Chicken pox   .  Depression   . Fatty liver   . Fatty liver   . Gastric polyps   . GERD (gastroesophageal reflux disease)   . Gestational diabetes   . Headache    migraines  . Heart murmur   . History of abnormal mammogram 08/2006, 01/2008, 03/2009  . Hypertension   . IBS (irritable bowel syndrome)   . Joint disorder    hx of left ankle tendonitis, bursitis, heel spur  . Obesity 2018   BMI  45  . Pelvic pain    adhesions and adenomyosis  . Rosacea     Past Surgical History:  Past Surgical History:  Procedure Laterality Date  . BREAST BIOPSY Left 08/2006   benign fibroadenoma  . BREAST EXCISIONAL BIOPSY Right 02/26/2008   right breast invasive mam ca with rad partial mastectomy   . BREAST SURGERY Right    x2/ Dr Tamala Julian  . CARDIAC CATHETERIZATION  2006  . CESAREAN SECTION  04/02/1998  . CHOLECYSTECTOMY  04/2010   Dr. Smith/ laprascopic surgery  . COLONOSCOPY  04/04/2000  . COLONOSCOPY WITH PROPOFOL N/A 02/12/2019   Procedure: COLONOSCOPY WITH PROPOFOL;  Surgeon: Lollie Sails, MD;  Location[facility] ENDOSCOPY;  Service: Endoscopy;  Laterality: N/A;  . ESOPHAGOGASTRODUODENOSCOPY (EGD) WITH PROPOFOL N/A 05/01/2015   Procedure: ESOPHAGOGASTRODUODENOSCOPY (EGD) WITH PROPOFOL;  Surgeon: Hulen Luster, MD;  Location: [facility] ENDOSCOPY;  Service: Gastroenterology;  Laterality: N/A;  . ESOPHAGOGASTRODUODENOSCOPY (EGD) WITH PROPOFOL N/A 02/12/2019   Procedure: ESOPHAGOGASTRODUODENOSCOPY (EGD) WITH PROPOFOL;  Surgeon: Lollie Sails, MD;  Location: [facility] ENDOSCOPY;  Service: Endoscopy;  Laterality: N/A;  . EYE SURGERY  08/2012   laser surgery on retina/ DR Appenzeller  . FINGER SURGERY     repair of tendon in right index, Dr. Francesco Sor in [place]  . FOOT SURGERY     Dr. Milinda Pointer  . IRRIGATION AND DEBRIDEMENT SEBACEOUS CYST     right upper back  . LAPAROSCOPIC SUPRACERVICAL HYSTERECTOMY  06/2007   Dr Kincius/ adhesions/CPP/adenomyosis  . MASTECTOMY PARTIAL / LUMPECTOMY Right 01/2008   with sentinal lymph node biopsy/ infiltrating ductal cancer  . REPAIR PERONEAL TENDONS ANKLE  10/2019   with tarsal exostectomy and sural neuroma excision on right   . SKIN CANCER EXCISION     back and calves  . WISDOM TOOTH EXTRACTION  1991    Family History:  Family History  Problem Relation Age of Onset  . Hypertension Mother   . Thyroid disease Mother   . Breast cancer Mother 29  . Colon  cancer Mother 33       again at 50  . Atrial fibrillation Mother   . Hip fracture Mother   . Stroke Father   . Hypertension Father   . Cancer Father 83       prostate  . Parkinson's disease Father   . Hypertension Sister   . Thyroid disease Sister   . Cancer Sister        skin/ squamous cell  . Lung cancer Paternal Aunt 80       smoker  . Diabetes Maternal Grandmother   . Stroke Maternal Grandfather   . Heart disease Paternal Grandfather   . Diabetes Maternal Aunt   . Lymphoma Maternal Uncle   . Thyroid cancer Cousin        maternal  . Heart disease Maternal Uncle   . Cancer Maternal Aunt   . Breast cancer Maternal Aunt 75    Social History:  Social History   Socioeconomic History  .  Marital status: Married    Spouse name: Richardson Landry  . Number of children: 1  . Years of education: 69  . Highest education level: Not on file  Occupational History  . Occupation: CMA  Tobacco Use  . Smoking status: Never Smoker  . Smokeless tobacco: Never Used  Vaping Use  . Vaping Use: Never used  Substance and Sexual Activity  . Alcohol use: Yes    Alcohol/week: 0.0 standard drinks    Comment: rare, 1-2 drinks per year  . Drug use: No  . Sexual activity: Yes    Partners: Male    Birth control/protection: Surgical    Comment: Hysterectomy   Other Topics Concern  . Not on file  Social History Narrative   Lives in [place] with husband, no pets.  Works at [employer].      Diet - regular   Exercise - occasional   Social Determinants of Health   Financial Resource Strain:   . Difficulty of Paying Living Expenses:   Food Insecurity:   . Worried About Charity fundraiser in the Last Year:   . Arboriculturist in the Last Year:   Transportation Needs:   . Film/video editor (Medical):   Marland Kitchen Lack of Transportation (Non-Medical):   Physical Activity:   . Days of Exercise per Week:   . Minutes of Exercise per Session:   Stress:   . Feeling of Stress :   Social Connections:   .  Frequency of Communication with Friends and Family:   . Frequency of Social Gatherings with Friends and Family:   . Attends Religious Services:   . Active Member of Clubs or Organizations:   . Attends Archivist Meetings:   Marland Kitchen Marital Status:   Intimate Partner Violence:   . Fear of Current or Ex-Partner:   . Emotionally Abused:   Marland Kitchen Physically Abused:   . Sexually Abused:     Allergies:  Allergies  Allergen Reactions  . Kiwi Extract Itching    swelling swelling swelling  . Other Swelling    Grapes--itching  . Codeine Other (See Comments)    Felt funny.  . Amoxicillin Rash  . Erythromycin Rash  . Sulfa Antibiotics Rash  . Tape Rash    adhesives    Medications:  Current Outpatient Medications:  .  acetaminophen (TYLENOL) 325 MG tablet, Take 650 mg by mouth every 6 (six) hours as needed., Disp: , Rfl:  .  amLODipine (NORVASC) 10 MG tablet, Take 0.5 tablets (5 mg total) by mouth daily., Disp: 90 tablet, Rfl: 3 .  Ascorbic Acid (VITAMIN C) 1000 MG tablet, Take by mouth., Disp: , Rfl:  .  Camphor-Eucalyptus-Menthol (VICKS VAPORUB) 4.7-1.2-2.6 % OINT, , Disp: , Rfl:  .  citalopram (CELEXA) 20 MG tablet, Take 20 mg by mouth daily., Disp: , Rfl:  .  clotrimazole (CLOTRIMAZOLE ANTI-FUNGAL) 1 % cream, Apply topically., Disp: , Rfl:  .  diphenhydrAMINE (BENADRYL) 25 MG tablet, Take by mouth., Disp: , Rfl:  .  hyoscyamine (LEVSIN, ANASPAZ) 0.125 MG tablet, , Disp: , Rfl: 3 .  ibuprofen (ADVIL) 800 MG tablet, TAKE 1 TABLET BY MOUTH EVERY 6 HOURS AS NEEDED., Disp: 60 tablet, Rfl: 1 .  ibuprofen (ADVIL,MOTRIN) 200 MG tablet, Take 200 mg by mouth every 6 (six) hours as needed., Disp: , Rfl:  .  ketotifen (ZADITOR) 0.025 % ophthalmic solution, Apply to eye., Disp: , Rfl:  .  LACTOBACILLUS PROBIOTIC PO, Take by mouth., Disp: , Rfl:  .  lisinopril (PRINIVIL,ZESTRIL) 10 MG tablet, Take by mouth., Disp: , Rfl:  .  loratadine (CLARITIN) 10 MG tablet, Take 10 mg by mouth daily., Disp:  , Rfl:  .  meclizine (ANTIVERT) 25 MG tablet, , Disp: , Rfl: 0 .  metFORMIN (GLUCOPHAGE-XR) 500 MG 24 hr tablet, , Disp: , Rfl:  .  metroNIDAZOLE (METROGEL) 0.75 % gel, , Disp: , Rfl:  .  minocycline (MINOCIN) 100 MG capsule, , Disp: , Rfl:  .  Multiple Vitamin (MULTIVITAMIN) tablet, Take 1 tablet by mouth daily., Disp: , Rfl:  .  omeprazole (PRILOSEC) 20 MG capsule, Take 20 mg by mouth daily., Disp: , Rfl:  .  Probiotic Product ([employer] PROBIOTIC MAXIMUM STRENGTH) CAPS, Take by mouth., Disp: , Rfl:  .  buPROPion (WELLBUTRIN XL) 300 MG 24 hr tablet, Take by mouth., Disp: , Rfl:   Physical Exam Vitals: BP 124/74   Ht _0  (1.6 m)   Wt 259 lb (117.5 kg)   BMI 45.88 kg/m   General: WF in NAD HEENT: normocephalic, anicteric Neck: no thyroid enlargement, no palpable nodules, no cervical lymphadenopathy  Pulmonary: No increased work of breathing, CTAB Cardiovascular: RRR, without murmur  Breast: architectural distortion of the  right breast in the lower outer quadrant s/p partial mastectomy, no masses palpable, no nipple discharge. No axillary, supraclavicular or infraclavicular adenopathy. White scar tissue on right nipple Abdomen: Soft, non-tender, obese, non-distended.  Umbilicus without lesions.  No hepatomegaly or masses palpable. No evidence of hernia. Genitourinary:  External: Multiple epidermal cysts in bilateral labia majora; Normal urethral meatus, normal Bartholin's and Skene's glands.    Vagina: Normal vaginal mucosa, no evidence of prolapse.    Cervix: Grossly normal in appearance, stenotic,  no bleeding, non-tender  Uterus: surgically absent  Adnexa: No adnexal masses, non-tender  Rectal: deferred  Lymphatic: no evidence of inguinal lymphadenopathy Extremities: no edema, erythema, or tenderness Neurologic: Grossly intact Psychiatric: mood appropriate, affect full     Assessment: 59 y.o. G1P0101 annual gyn exam History of right breast cancer Obesity  Plan:   1) Breast  cancer screening - recommend monthly self breast exam and annual mammograms. Next due after 03/28/2019.  Mammogram was ordered today.  2) Osteoporosis prevention: Dexa scan ordered. Reviewed requirements of calcium and vitamin D3.  3) Cervical cancer screening - Pap was done. ASCCP guidelines and rational discussed.  Patient opts for yearly screening interval  4) Colon cancer screen-colonoscopy UTD. Next colonoscopy due in 4 years  5) Routine healthcare maintenance including cholesterol and diabetes testing UTD . Repeat TSH/ HIV was labs ordered by PCP  6) Annual in 1 year.   Dalia Heading, CNM

## 2020-01-24 LAB — CYTOLOGY - PAP
Adequacy: ABSENT
Diagnosis: NEGATIVE

## 2020-01-27 ENCOUNTER — Encounter: Payer: Self-pay | Admitting: Certified Nurse Midwife

## 2020-01-28 ENCOUNTER — Encounter: Payer: Self-pay | Admitting: Certified Nurse Midwife

## 2020-02-06 ENCOUNTER — Telehealth: Payer: Self-pay | Admitting: Certified Nurse Midwife

## 2020-02-06 NOTE — Telephone Encounter (Signed)
Patient aware of her time and date at [facility] for her mammogram and dexa on 04/01/2020 @9 :20am

## 2020-02-20 DIAGNOSIS — M545 Low back pain: Secondary | ICD-10-CM | POA: Diagnosis not present

## 2020-02-20 DIAGNOSIS — R519 Headache, unspecified: Secondary | ICD-10-CM | POA: Diagnosis not present

## 2020-02-20 DIAGNOSIS — M9903 Segmental and somatic dysfunction of lumbar region: Secondary | ICD-10-CM | POA: Diagnosis not present

## 2020-02-20 DIAGNOSIS — M9902 Segmental and somatic dysfunction of thoracic region: Secondary | ICD-10-CM | POA: Diagnosis not present

## 2020-02-20 DIAGNOSIS — M9904 Segmental and somatic dysfunction of sacral region: Secondary | ICD-10-CM | POA: Diagnosis not present

## 2020-02-20 DIAGNOSIS — M9901 Segmental and somatic dysfunction of cervical region: Secondary | ICD-10-CM | POA: Diagnosis not present

## 2020-02-20 DIAGNOSIS — M6080 Other myositis, unspecified site: Secondary | ICD-10-CM | POA: Diagnosis not present

## 2020-02-20 DIAGNOSIS — M5412 Radiculopathy, cervical region: Secondary | ICD-10-CM | POA: Diagnosis not present

## 2020-02-20 DIAGNOSIS — M542 Cervicalgia: Secondary | ICD-10-CM | POA: Diagnosis not present

## 2020-02-26 DIAGNOSIS — F411 Generalized anxiety disorder: Secondary | ICD-10-CM | POA: Diagnosis not present

## 2020-02-26 DIAGNOSIS — I10 Essential (primary) hypertension: Secondary | ICD-10-CM | POA: Diagnosis not present

## 2020-02-26 DIAGNOSIS — E119 Type 2 diabetes mellitus without complications: Secondary | ICD-10-CM | POA: Diagnosis not present

## 2020-03-04 ENCOUNTER — Other Ambulatory Visit: Payer: Self-pay | Admitting: Certified Nurse Midwife

## 2020-03-04 DIAGNOSIS — E119 Type 2 diabetes mellitus without complications: Secondary | ICD-10-CM

## 2020-03-04 DIAGNOSIS — Z79899 Other long term (current) drug therapy: Secondary | ICD-10-CM

## 2020-03-06 ENCOUNTER — Other Ambulatory Visit: Payer: Self-pay

## 2020-03-06 ENCOUNTER — Other Ambulatory Visit: Payer: 59

## 2020-03-06 DIAGNOSIS — Z79899 Other long term (current) drug therapy: Secondary | ICD-10-CM | POA: Diagnosis not present

## 2020-03-06 DIAGNOSIS — E119 Type 2 diabetes mellitus without complications: Secondary | ICD-10-CM

## 2020-03-07 LAB — CMP14+EGFR
ALT: 15 IU/L (ref 0–32)
AST: 13 IU/L (ref 0–40)
Albumin/Globulin Ratio: 1.8 (ref 1.2–2.2)
Albumin: 4.2 g/dL (ref 3.8–4.9)
Alkaline Phosphatase: 134 IU/L — ABNORMAL HIGH (ref 48–121)
BUN/Creatinine Ratio: 21 (ref 9–23)
BUN: 18 mg/dL (ref 6–24)
Bilirubin Total: <0.2 mg/dL (ref 0.0–1.2)
CO2: 25 mmol/L (ref 20–29)
Calcium: 9.5 mg/dL (ref 8.7–10.2)
Chloride: 103 mmol/L (ref 96–106)
Creatinine, Ser: 0.86 mg/dL (ref 0.57–1.00)
GFR calc Af Amer: 87 mL/min/{1.73_m2} (ref >59)
GFR calc non Af Amer: 75 mL/min/{1.73_m2} (ref >59)
Globulin, Total: 2.4 g/dL (ref 1.5–4.5)
Glucose: 86 mg/dL (ref 65–99)
Potassium: 5 mmol/L (ref 3.5–5.2)
Sodium: 141 mmol/L (ref 134–144)
Total Protein: 6.6 g/dL (ref 6.0–8.5)

## 2020-03-07 LAB — CBC WITH DIFFERENTIAL/PLATELET
Basophils Absolute: 0 10*3/uL (ref 0.0–0.2)
Basos: 0 %
EOS (ABSOLUTE): 0.1 10*3/uL (ref 0.0–0.4)
Eos: 2 %
Hematocrit: 38.6 % (ref 34.0–46.6)
Hemoglobin: 11.9 g/dL (ref 11.1–15.9)
Immature Grans (Abs): 0 10*3/uL (ref 0.0–0.1)
Immature Granulocytes: 0 %
Lymphocytes Absolute: 1.6 10*3/uL (ref 0.7–3.1)
Lymphs: 22 %
MCH: 24.7 pg — ABNORMAL LOW (ref 26.6–33.0)
MCHC: 30.8 g/dL — ABNORMAL LOW (ref 31.5–35.7)
MCV: 80 fL (ref 79–97)
Monocytes Absolute: 0.7 10*3/uL (ref 0.1–0.9)
Monocytes: 10 %
Neutrophils Absolute: 4.7 10*3/uL (ref 1.4–7.0)
Neutrophils: 66 %
Platelets: 253 10*3/uL (ref 150–450)
RBC: 4.81 x10E6/uL (ref 3.77–5.28)
RDW: 15.5 % — ABNORMAL HIGH (ref 11.7–15.4)
WBC: 7.2 10*3/uL (ref 3.4–10.8)

## 2020-03-07 LAB — LIPID PANEL WITH LDL/HDL RATIO
Cholesterol, Total: 154 mg/dL (ref 100–199)
HDL: 59 mg/dL (ref >39)
LDL Chol Calc (NIH): 76 mg/dL (ref 0–99)
LDL/HDL Ratio: 1.3 ratio (ref 0.0–3.2)
Triglycerides: 103 mg/dL (ref 0–149)
VLDL Cholesterol Cal: 19 mg/dL (ref 5–40)

## 2020-03-07 LAB — HEMOGLOBIN A1C
Est. average glucose Bld gHb Est-mCnc: 126 mg/dL
Hgb A1c MFr Bld: 6 % — ABNORMAL HIGH (ref 4.8–5.6)

## 2020-03-10 ENCOUNTER — Other Ambulatory Visit: Payer: Self-pay | Admitting: Certified Nurse Midwife

## 2020-03-10 DIAGNOSIS — Z853 Personal history of malignant neoplasm of breast: Secondary | ICD-10-CM

## 2020-03-10 DIAGNOSIS — R2231 Localized swelling, mass and lump, right upper limb: Secondary | ICD-10-CM

## 2020-03-14 DIAGNOSIS — E119 Type 2 diabetes mellitus without complications: Secondary | ICD-10-CM | POA: Diagnosis not present

## 2020-03-19 DIAGNOSIS — M6080 Other myositis, unspecified site: Secondary | ICD-10-CM | POA: Diagnosis not present

## 2020-03-19 DIAGNOSIS — M9903 Segmental and somatic dysfunction of lumbar region: Secondary | ICD-10-CM | POA: Diagnosis not present

## 2020-03-19 DIAGNOSIS — M9902 Segmental and somatic dysfunction of thoracic region: Secondary | ICD-10-CM | POA: Diagnosis not present

## 2020-03-19 DIAGNOSIS — M542 Cervicalgia: Secondary | ICD-10-CM | POA: Diagnosis not present

## 2020-03-19 DIAGNOSIS — M9901 Segmental and somatic dysfunction of cervical region: Secondary | ICD-10-CM | POA: Diagnosis not present

## 2020-03-19 DIAGNOSIS — M5412 Radiculopathy, cervical region: Secondary | ICD-10-CM | POA: Diagnosis not present

## 2020-03-19 DIAGNOSIS — M545 Low back pain: Secondary | ICD-10-CM | POA: Diagnosis not present

## 2020-03-19 DIAGNOSIS — M9904 Segmental and somatic dysfunction of sacral region: Secondary | ICD-10-CM | POA: Diagnosis not present

## 2020-03-19 DIAGNOSIS — R519 Headache, unspecified: Secondary | ICD-10-CM | POA: Diagnosis not present

## 2020-03-24 MED FILL — LISINOPRIL 10 MG TABS: 10 | 90 days supply | Qty: 90 | Fill #0

## 2020-03-24 MED FILL — metFORMIN HCL ER 500 MG TB2: 500 | 90 days supply | Qty: 180 | Fill #0

## 2020-03-25 MED FILL — AMLODIPINE BESYLATE 10 MG T: 10 | 90 days supply | Qty: 90 | Fill #0

## 2020-04-01 ENCOUNTER — Other Ambulatory Visit: Payer: Self-pay | Admitting: Obstetrics and Gynecology

## 2020-04-01 ENCOUNTER — Other Ambulatory Visit: Payer: Self-pay | Admitting: Certified Nurse Midwife

## 2020-04-01 ENCOUNTER — Ambulatory Visit
Admission: RE | Admit: 2020-04-01 | Discharge: 2020-04-01 | Disposition: A | Discharge status: Home / Self Care | Payer: 59 | Source: Ambulatory Visit | Attending: Certified Nurse Midwife | Admitting: Certified Nurse Midwife

## 2020-04-01 ENCOUNTER — Other Ambulatory Visit: Payer: Self-pay

## 2020-04-01 ENCOUNTER — Ambulatory Visit
Admission: RE | Admit: 2020-04-01 | Discharge: 2020-04-01 | Disposition: A | Discharge status: Home / Self Care | Payer: 59 | Source: Ambulatory Visit | Attending: Obstetrics and Gynecology | Admitting: Obstetrics and Gynecology

## 2020-04-01 DIAGNOSIS — R2231 Localized swelling, mass and lump, right upper limb: Secondary | ICD-10-CM

## 2020-04-01 DIAGNOSIS — N6320 Unspecified lump in the left breast, unspecified quadrant: Secondary | ICD-10-CM | POA: Insufficient documentation

## 2020-04-01 DIAGNOSIS — Z780 Asymptomatic menopausal state: Secondary | ICD-10-CM | POA: Diagnosis not present

## 2020-04-01 DIAGNOSIS — Z853 Personal history of malignant neoplasm of breast: Secondary | ICD-10-CM

## 2020-04-01 DIAGNOSIS — N6012 Diffuse cystic mastopathy of left breast: Secondary | ICD-10-CM | POA: Diagnosis not present

## 2020-04-01 DIAGNOSIS — R928 Other abnormal and inconclusive findings on diagnostic imaging of breast: Secondary | ICD-10-CM | POA: Diagnosis not present

## 2020-04-01 DIAGNOSIS — Z13820 Encounter for screening for osteoporosis: Secondary | ICD-10-CM | POA: Diagnosis not present

## 2020-04-01 DIAGNOSIS — N6489 Other specified disorders of breast: Secondary | ICD-10-CM | POA: Diagnosis not present

## 2020-04-01 HISTORY — DX: Personal history of irradiation: Z92.3

## 2020-04-10 DIAGNOSIS — F419 Anxiety disorder, unspecified: Secondary | ICD-10-CM | POA: Diagnosis not present

## 2020-04-10 MED FILL — CITALOPRAM HBR 40 MG TABLET: 40 | 90 days supply | Qty: 90 | Fill #0

## 2020-04-16 DIAGNOSIS — M9902 Segmental and somatic dysfunction of thoracic region: Secondary | ICD-10-CM | POA: Diagnosis not present

## 2020-04-16 DIAGNOSIS — R519 Headache, unspecified: Secondary | ICD-10-CM | POA: Diagnosis not present

## 2020-04-16 DIAGNOSIS — M542 Cervicalgia: Secondary | ICD-10-CM | POA: Diagnosis not present

## 2020-04-16 DIAGNOSIS — M6080 Other myositis, unspecified site: Secondary | ICD-10-CM | POA: Diagnosis not present

## 2020-04-16 DIAGNOSIS — M9901 Segmental and somatic dysfunction of cervical region: Secondary | ICD-10-CM | POA: Diagnosis not present

## 2020-04-16 DIAGNOSIS — M545 Low back pain, unspecified: Secondary | ICD-10-CM | POA: Diagnosis not present

## 2020-04-16 DIAGNOSIS — M5412 Radiculopathy, cervical region: Secondary | ICD-10-CM | POA: Diagnosis not present

## 2020-04-16 DIAGNOSIS — M9904 Segmental and somatic dysfunction of sacral region: Secondary | ICD-10-CM | POA: Diagnosis not present

## 2020-04-16 DIAGNOSIS — M9903 Segmental and somatic dysfunction of lumbar region: Secondary | ICD-10-CM | POA: Diagnosis not present

## 2020-04-22 ENCOUNTER — Other Ambulatory Visit (HOSPITAL_COMMUNITY): Payer: Self-pay | Admitting: Gastroenterology

## 2020-04-22 MED FILL — OMEPRAZOLE 20 MG CAP: 20 | 90 days supply | Qty: 180 | Fill #0

## 2020-05-23 ENCOUNTER — Other Ambulatory Visit: Payer: Self-pay | Admitting: Internal Medicine

## 2020-05-23 ENCOUNTER — Ambulatory Visit: Payer: 59 | Attending: Internal Medicine

## 2020-05-23 DIAGNOSIS — Z23 Encounter for immunization: Secondary | ICD-10-CM

## 2020-05-23 NOTE — Progress Notes (Signed)
   Covid-19 Vaccination Clinic  Name:  Jacqueline Deleon    MRN: [id number] DOB: 01-08-1961  05/23/2020  Ms. Villeda was observed post Covid-19 immunization for 30 minutes based on pre-vaccination screening without incident. She was provided with Vaccine Information Sheet and instruction to access the V-Safe system.   Ms. Kohn was instructed to call 911 with any severe reactions post vaccine: Marland Kitchen Difficulty breathing  . Swelling of face and throat  . A fast heartbeat  . A bad rash all over body  . Dizziness and weakness

## 2020-05-27 DIAGNOSIS — M9905 Segmental and somatic dysfunction of pelvic region: Secondary | ICD-10-CM | POA: Diagnosis not present

## 2020-05-27 DIAGNOSIS — M9903 Segmental and somatic dysfunction of lumbar region: Secondary | ICD-10-CM | POA: Diagnosis not present

## 2020-05-27 DIAGNOSIS — M7072 Other bursitis of hip, left hip: Secondary | ICD-10-CM | POA: Diagnosis not present

## 2020-05-27 DIAGNOSIS — R519 Headache, unspecified: Secondary | ICD-10-CM | POA: Diagnosis not present

## 2020-05-27 DIAGNOSIS — M25552 Pain in left hip: Secondary | ICD-10-CM | POA: Diagnosis not present

## 2020-05-27 DIAGNOSIS — M5412 Radiculopathy, cervical region: Secondary | ICD-10-CM | POA: Diagnosis not present

## 2020-05-27 DIAGNOSIS — M5451 Vertebrogenic low back pain: Secondary | ICD-10-CM | POA: Diagnosis not present

## 2020-05-27 DIAGNOSIS — M9904 Segmental and somatic dysfunction of sacral region: Secondary | ICD-10-CM | POA: Diagnosis not present

## 2020-05-27 DIAGNOSIS — M9901 Segmental and somatic dysfunction of cervical region: Secondary | ICD-10-CM | POA: Diagnosis not present

## 2020-05-29 ENCOUNTER — Other Ambulatory Visit: Payer: Self-pay | Admitting: Gastroenterology

## 2020-05-29 ENCOUNTER — Other Ambulatory Visit (HOSPITAL_COMMUNITY): Payer: Self-pay | Admitting: Gastroenterology

## 2020-05-29 DIAGNOSIS — M25552 Pain in left hip: Secondary | ICD-10-CM | POA: Diagnosis not present

## 2020-05-29 DIAGNOSIS — M9901 Segmental and somatic dysfunction of cervical region: Secondary | ICD-10-CM | POA: Diagnosis not present

## 2020-05-29 DIAGNOSIS — M5451 Vertebrogenic low back pain: Secondary | ICD-10-CM | POA: Diagnosis not present

## 2020-05-29 DIAGNOSIS — M9904 Segmental and somatic dysfunction of sacral region: Secondary | ICD-10-CM | POA: Diagnosis not present

## 2020-05-29 DIAGNOSIS — M5412 Radiculopathy, cervical region: Secondary | ICD-10-CM | POA: Diagnosis not present

## 2020-05-29 DIAGNOSIS — K219 Gastro-esophageal reflux disease without esophagitis: Secondary | ICD-10-CM | POA: Diagnosis not present

## 2020-05-29 DIAGNOSIS — M9905 Segmental and somatic dysfunction of pelvic region: Secondary | ICD-10-CM | POA: Diagnosis not present

## 2020-05-29 DIAGNOSIS — K7469 Other cirrhosis of liver: Secondary | ICD-10-CM | POA: Diagnosis not present

## 2020-05-29 DIAGNOSIS — M9903 Segmental and somatic dysfunction of lumbar region: Secondary | ICD-10-CM | POA: Diagnosis not present

## 2020-05-29 DIAGNOSIS — M7072 Other bursitis of hip, left hip: Secondary | ICD-10-CM | POA: Diagnosis not present

## 2020-05-29 DIAGNOSIS — R519 Headache, unspecified: Secondary | ICD-10-CM | POA: Diagnosis not present

## 2020-06-04 ENCOUNTER — Ambulatory Visit: Payer: 59

## 2020-06-09 ENCOUNTER — Ambulatory Visit
Admission: RE | Admit: 2020-06-09 | Discharge: 2020-06-09 | Disposition: A | Discharge status: Home / Self Care | Payer: 59 | Source: Ambulatory Visit | Attending: Gastroenterology | Admitting: Gastroenterology

## 2020-06-09 ENCOUNTER — Other Ambulatory Visit: Payer: Self-pay

## 2020-06-09 DIAGNOSIS — K7469 Other cirrhosis of liver: Secondary | ICD-10-CM | POA: Insufficient documentation

## 2020-06-09 DIAGNOSIS — K7460 Unspecified cirrhosis of liver: Secondary | ICD-10-CM | POA: Diagnosis not present

## 2020-06-16 MED FILL — LISINOPRIL 10 MG TABS: 10 | 90 days supply | Qty: 90 | Fill #0

## 2020-06-16 MED FILL — metFORMIN HCL ER 500 MG TB2: 500 | 90 days supply | Qty: 180 | Fill #0

## 2020-06-17 MED FILL — AMLODIPINE BESYLATE 10 MG T: 10 | 90 days supply | Qty: 90 | Fill #0

## 2020-06-23 ENCOUNTER — Other Ambulatory Visit (HOSPITAL_COMMUNITY): Payer: Self-pay | Admitting: Family Medicine

## 2020-06-23 MED FILL — buPROPion HCL ER (XL) 300 M: 300 | 90 days supply | Qty: 90 | Fill #0

## 2020-06-26 ENCOUNTER — Other Ambulatory Visit: Payer: Self-pay | Admitting: Obstetrics and Gynecology

## 2020-06-26 DIAGNOSIS — R519 Headache, unspecified: Secondary | ICD-10-CM | POA: Diagnosis not present

## 2020-06-26 DIAGNOSIS — K7460 Unspecified cirrhosis of liver: Secondary | ICD-10-CM

## 2020-07-12 DIAGNOSIS — C50919 Malignant neoplasm of unspecified site of unspecified female breast: Secondary | ICD-10-CM

## 2020-07-12 DIAGNOSIS — Z1502 Genetic susceptibility to malignant neoplasm of ovary: Secondary | ICD-10-CM

## 2020-07-12 HISTORY — DX: Genetic susceptibility to malignant neoplasm of ovary: Z15.02

## 2020-07-12 HISTORY — DX: Malignant neoplasm of unspecified site of unspecified female breast: C50.919

## 2020-07-16 MED FILL — OMEPRAZOLE 20 MG CAP: 20 | 90 days supply | Qty: 180 | Fill #1

## 2020-07-18 ENCOUNTER — Other Ambulatory Visit: Payer: Self-pay | Admitting: Family

## 2020-07-18 DIAGNOSIS — Z853 Personal history of malignant neoplasm of breast: Secondary | ICD-10-CM

## 2020-07-18 DIAGNOSIS — G4733 Obstructive sleep apnea (adult) (pediatric): Secondary | ICD-10-CM

## 2020-07-18 DIAGNOSIS — U071 COVID-19: Secondary | ICD-10-CM

## 2020-07-18 DIAGNOSIS — I10 Essential (primary) hypertension: Secondary | ICD-10-CM

## 2020-07-18 DIAGNOSIS — K7460 Unspecified cirrhosis of liver: Secondary | ICD-10-CM

## 2020-07-18 DIAGNOSIS — E669 Obesity, unspecified: Secondary | ICD-10-CM

## 2020-07-18 DIAGNOSIS — E119 Type 2 diabetes mellitus without complications: Secondary | ICD-10-CM

## 2020-07-18 DIAGNOSIS — C4491 Basal cell carcinoma of skin, unspecified: Secondary | ICD-10-CM

## 2020-07-18 NOTE — Progress Notes (Signed)
Called to discuss with patient about COVID-19 symptoms and the use of one of the available treatments for those with mild to moderate Covid symptoms and at a high risk of hospitalization.  Pt appears to qualify for outpatient treatment due to co-morbid conditions and/or a member of an at-risk group in accordance with the FDA Emergency Use Authorization.    Symptom onset: 07/14/20 Vaccinated: Yes  Booster? Yes Qualifiers: Cirrhosis, hyperlipidemia, diabetes, history of breast cancer, hypertension  Ms. Ruminski began having symptoms on 07/13/20 and tested positive through [facility]. Continues to experience congestion, cough, shortness of breath and fatigue.  I discussed the risks, benefits and financial costs associated with treatment and she wishes to continue.   I connected by phone with Annabelle Harman on 07/18/2020 at 2:05 PM to discuss the potential use of a new treatment for mild to moderate COVID-19 viral infection in non-hospitalized patients.  This patient is a 60 y.o. female that meets the FDA criteria for Emergency Use Authorization of COVID monoclonal antibody sotrovimab.  Has a (+) direct SARS-CoV-2 viral test result  Has mild or moderate COVID-19   Is NOT hospitalized due to COVID-19  Is within 10 days of symptom onset  Has at least one of the high risk factor(s) for progression to severe COVID-19 and/or hospitalization as defined in EUA.  Specific high risk criteria : BMI > 25, Chronic Kidney Disease (CKD), Diabetes, Immunosuppressive Disease or Treatment and Cardiovascular disease or hypertension   I have spoken and communicated the following to the patient or parent/caregiver regarding COVID monoclonal antibody treatment:  1. FDA has authorized the emergency use for the treatment of mild to moderate COVID-19 in adults and pediatric patients with positive results of direct SARS-CoV-2 viral testing who are 56 years of age and older weighing at least 40 kg, and who are at  high risk for progressing to severe COVID-19 and/or hospitalization.  2. The significant known and potential risks and benefits of COVID monoclonal antibody, and the extent to which such potential risks and benefits are unknown.  3. Information on available alternative treatments and the risks and benefits of those alternatives, including clinical trials.  4. Patients treated with COVID monoclonal antibody should continue to self-isolate and use infection control measures (e.g., wear mask, isolate, social distance, avoid sharing personal items, clean and disinfect "high touch" surfaces, and frequent handwashing) according to CDC guidelines.   5. The patient or parent/caregiver has the option to accept or refuse COVID monoclonal antibody treatment.  After reviewing this information with the patient, the patient has agreed to receive one of the available covid 19 monoclonal antibodies and will be provided an appropriate fact sheet prior to infusion.   Mauricio Po, FNP 07/18/2020 2:05 PM

## 2020-07-19 ENCOUNTER — Ambulatory Visit (HOSPITAL_COMMUNITY)
Admission: RE | Admit: 2020-07-19 | Discharge: 2020-07-19 | Disposition: A | Discharge status: Home / Self Care | Payer: 59 | Source: Ambulatory Visit | Attending: Pulmonary Disease | Admitting: Pulmonary Disease

## 2020-07-19 DIAGNOSIS — C4491 Basal cell carcinoma of skin, unspecified: Secondary | ICD-10-CM | POA: Diagnosis not present

## 2020-07-19 DIAGNOSIS — I10 Essential (primary) hypertension: Secondary | ICD-10-CM | POA: Diagnosis not present

## 2020-07-19 DIAGNOSIS — Z9989 Dependence on other enabling machines and devices: Secondary | ICD-10-CM | POA: Insufficient documentation

## 2020-07-19 DIAGNOSIS — U071 COVID-19: Secondary | ICD-10-CM | POA: Diagnosis not present

## 2020-07-19 DIAGNOSIS — G4733 Obstructive sleep apnea (adult) (pediatric): Secondary | ICD-10-CM | POA: Insufficient documentation

## 2020-07-19 DIAGNOSIS — Z853 Personal history of malignant neoplasm of breast: Secondary | ICD-10-CM | POA: Diagnosis not present

## 2020-07-19 DIAGNOSIS — E669 Obesity, unspecified: Secondary | ICD-10-CM | POA: Diagnosis not present

## 2020-07-19 DIAGNOSIS — E119 Type 2 diabetes mellitus without complications: Secondary | ICD-10-CM | POA: Diagnosis not present

## 2020-07-19 DIAGNOSIS — K7460 Unspecified cirrhosis of liver: Secondary | ICD-10-CM | POA: Diagnosis not present

## 2020-07-19 MED ORDER — EPINEPHRINE 0.3 MG/0.3ML IJ SOAJ: 0.3000 mg | Freq: Once | INTRAMUSCULAR | Status: DC | PRN

## 2020-07-19 MED ORDER — DIPHENHYDRAMINE HCL 50 MG/ML IJ SOLN: 50.0000 mg | Freq: Once | INTRAMUSCULAR | Status: DC | PRN

## 2020-07-19 MED ORDER — FAMOTIDINE IN NACL 20-0.9 MG/50ML-% IV SOLN: 20.0000 mg | Freq: Once | INTRAVENOUS | Status: DC | PRN

## 2020-07-19 MED ORDER — ALBUTEROL SULFATE HFA 108 (90 BASE) MCG/ACT IN AERS: 2.0000 | INHALATION_SPRAY | Freq: Once | RESPIRATORY_TRACT | Status: DC | PRN

## 2020-07-19 MED ORDER — SODIUM CHLORIDE 0.9 % IV SOLN: INTRAVENOUS | Status: DC | PRN

## 2020-07-19 MED ORDER — SOTROVIMAB 500 MG/8ML IV SOLN
500.0000 mg | Freq: Once | INTRAVENOUS | Status: AC
  Administered 2020-07-19: 500 mg via INTRAVENOUS

## 2020-07-19 MED ORDER — METHYLPREDNISOLONE SODIUM SUCC 125 MG IJ SOLR: 125.0000 mg | Freq: Once | INTRAMUSCULAR | Status: DC | PRN

## 2020-07-19 NOTE — Progress Notes (Signed)
   Procedure: Diagnosis: [id number]  Physician: Dr. Asencion Noble  Procedure: Covid Infusion Clinic Med: Sotrovimab infusion - Provided patient with sotrovimab fact sheet for patients, parents, and caregivers prior to infusion.   Complications: No immediate complications noted  Discharge: Discharged home   Complications: No immediate complications noted.  Discharge: Discharged home   Jacqueline Deleon 07/19/2020

## 2020-07-19 NOTE — Discharge Instructions (Signed)
10 Things You Can Do to Manage Your COVID-19 Symptoms at Home If you have possible or confirmed COVID-19: 1. Stay home from work and school. And stay away from other public places. If you must go out, avoid using any kind of public transportation, ridesharing, or taxis. 2. Monitor your symptoms carefully. If your symptoms get worse, call your healthcare provider immediately. 3. Get rest and stay hydrated. 4. If you have a medical appointment, call the healthcare provider ahead of time and tell them that you have or may have COVID-19. 5. For medical emergencies, call 911 and notify the dispatch personnel that you have or may have COVID-19. 6. Cover your cough and sneezes with a tissue or use the inside of your elbow. 7. Wash your hands often with soap and water for at least 20 seconds or clean your hands with an alcohol-based hand sanitizer that contains at least 60% alcohol. 8. As much as possible, stay in a specific room and away from other people in your home. Also, you should use a separate bathroom, if available. If you need to be around other people in or outside of the home, wear a mask. 9. Avoid sharing personal items with other people in your household, like dishes, towels, and bedding. 10. Clean all surfaces that are touched often, like counters, tabletops, and doorknobs. Use household cleaning sprays or wipes according to the label instructions. cdc.gov/coronavirus 01/10/2019 This information is not intended to replace advice given to you by your health care provider. Make sure you discuss any questions you have with your health care provider. Document Revised: 06/14/2019 Document Reviewed: 06/14/2019 Elsevier Patient Education  2020 Elsevier Inc. What types of side effects do monoclonal antibody drugs cause?  Common side effects  In general, the more common side effects caused by monoclonal antibody drugs include: . Allergic reactions, such as hives or itching . Flu-like signs and  symptoms, including chills, fatigue, fever, and muscle aches and pains . Nausea, vomiting . Diarrhea . Skin rashes . Low blood pressure   The CDC is recommending patients who receive monoclonal antibody treatments wait at least 90 days before being vaccinated.  Currently, there are no data on the safety and efficacy of mRNA COVID-19 vaccines in persons who received monoclonal antibodies or convalescent plasma as part of COVID-19 treatment. Based on the estimated half-life of such therapies as well as evidence suggesting that reinfection is uncommon in the 90 days after initial infection, vaccination should be deferred for at least 90 days, as a precautionary measure until additional information becomes available, to avoid interference of the antibody treatment with vaccine-induced immune responses. If you have any questions or concerns after the infusion please call the Advanced Practice Provider on call at [phone]. This number is ONLY intended for your use regarding questions or concerns about the infusion post-treatment side-effects.  Please do not provide this number to others for use. For return to work notes please contact your primary care provider.   If someone you know is interested in receiving treatment please have them call the COVID hotline at [phone].   

## 2020-07-19 NOTE — Progress Notes (Signed)
Patient reviewed Fact Sheet for Patients, Parents, and Caregivers for Emergency Use Authorization (EUA) of Sotrovimab for the Treatment of Coronavirus. Patient also reviewed and is agreeable to the estimated cost of treatment. Patient is agreeable to proceed.   

## 2020-07-22 ENCOUNTER — Emergency Department: Payer: 59

## 2020-07-22 ENCOUNTER — Other Ambulatory Visit: Payer: Self-pay

## 2020-07-22 ENCOUNTER — Emergency Department
Admission: EM | Admit: 2020-07-22 | Discharge: 2020-07-22 | Disposition: A | Discharge status: Home / Self Care | Payer: 59 | Attending: Emergency Medicine | Admitting: Emergency Medicine

## 2020-07-22 ENCOUNTER — Encounter: Payer: Self-pay | Admitting: Emergency Medicine

## 2020-07-22 DIAGNOSIS — E119 Type 2 diabetes mellitus without complications: Secondary | ICD-10-CM | POA: Diagnosis not present

## 2020-07-22 DIAGNOSIS — R0789 Other chest pain: Secondary | ICD-10-CM | POA: Diagnosis not present

## 2020-07-22 DIAGNOSIS — C50919 Malignant neoplasm of unspecified site of unspecified female breast: Secondary | ICD-10-CM | POA: Diagnosis not present

## 2020-07-22 DIAGNOSIS — R059 Cough, unspecified: Secondary | ICD-10-CM | POA: Diagnosis not present

## 2020-07-22 DIAGNOSIS — Z853 Personal history of malignant neoplasm of breast: Secondary | ICD-10-CM | POA: Diagnosis not present

## 2020-07-22 DIAGNOSIS — R0602 Shortness of breath: Secondary | ICD-10-CM | POA: Insufficient documentation

## 2020-07-22 DIAGNOSIS — C50911 Malignant neoplasm of unspecified site of right female breast: Secondary | ICD-10-CM | POA: Diagnosis not present

## 2020-07-22 DIAGNOSIS — Z7984 Long term (current) use of oral hypoglycemic drugs: Secondary | ICD-10-CM | POA: Diagnosis not present

## 2020-07-22 DIAGNOSIS — Z79899 Other long term (current) drug therapy: Secondary | ICD-10-CM | POA: Insufficient documentation

## 2020-07-22 DIAGNOSIS — U071 COVID-19: Secondary | ICD-10-CM | POA: Diagnosis not present

## 2020-07-22 DIAGNOSIS — I10 Essential (primary) hypertension: Secondary | ICD-10-CM | POA: Diagnosis not present

## 2020-07-22 DIAGNOSIS — R079 Chest pain, unspecified: Secondary | ICD-10-CM | POA: Diagnosis not present

## 2020-07-22 LAB — CBC
HCT: 41 % (ref 36.0–46.0)
Hemoglobin: 12.5 g/dL (ref 12.0–15.0)
MCH: 23.6 pg — ABNORMAL LOW (ref 26.0–34.0)
MCHC: 30.5 g/dL (ref 30.0–36.0)
MCV: 77.5 fL — ABNORMAL LOW (ref 80.0–100.0)
Platelets: 241 10*3/uL (ref 150–400)
RBC: 5.29 MIL/uL — ABNORMAL HIGH (ref 3.87–5.11)
RDW: 18.6 % — ABNORMAL HIGH (ref 11.5–15.5)
WBC: 8.9 10*3/uL (ref 4.0–10.5)
nRBC: 0 % (ref 0.0–0.2)

## 2020-07-22 LAB — TROPONIN I (HIGH SENSITIVITY)
Troponin I (High Sensitivity): 3 ng/L (ref <18)
Troponin I (High Sensitivity): 3 ng/L (ref <18)

## 2020-07-22 LAB — BASIC METABOLIC PANEL
Anion gap: 10 (ref 5–15)
BUN: 18 mg/dL (ref 6–20)
CO2: 27 mmol/L (ref 22–32)
Calcium: 9.5 mg/dL (ref 8.9–10.3)
Chloride: 101 mmol/L (ref 98–111)
Creatinine, Ser: 0.91 mg/dL (ref 0.44–1.00)
GFR, Estimated: >60 mL/min (ref >60)
Glucose, Bld: 76 mg/dL (ref 70–99)
Potassium: 4.5 mmol/L (ref 3.5–5.1)
Sodium: 138 mmol/L (ref 135–145)

## 2020-07-22 MED ORDER — KETOROLAC TROMETHAMINE 30 MG/ML IJ SOLN
15.0000 mg | Freq: Once | INTRAMUSCULAR | Status: AC
  Administered 2020-07-22: 15 mg via INTRAVENOUS
  Filled 2020-07-22: qty 1

## 2020-07-22 MED ORDER — IOHEXOL 350 MG/ML SOLN
75.0000 mL | Freq: Once | INTRAVENOUS | Status: AC | PRN
  Administered 2020-07-22: 75 mL via INTRAVENOUS

## 2020-07-22 MED ORDER — ACETAMINOPHEN 500 MG PO TABS
1000.0000 mg | ORAL_TABLET | Freq: Once | ORAL | Status: AC
  Administered 2020-07-22: 1000 mg via ORAL
  Filled 2020-07-22: qty 2

## 2020-07-22 NOTE — ED Provider Notes (Signed)
[facility] Emergency Department Provider Note  ____________________________________________   Event Date/Time   First MD Initiated Contact with Patient 07/22/20 1502     (approximate)  I have reviewed the triage vital signs    HISTORY  Chief Complaint Chest Pain and Shortness of Breath    HPI Jacqueline Deleon is a 60 y.o. female with breast cancer who was COVID-positive on 1/3 who comes in for evaluation of chest pain and shortness of breath.  Patient states that she has had chest pain that started yesterday that radiates into her back, severe, constant, nothing makes it better, nothing makes it worse.  She reports worsening shortness of breath as well.  States that she when she was at urgent care her heart rate was up in the 120s and they were concerned about her heart.  Denies any other risk factors for PE.  Denies any other symptoms          Past Medical History:  Diagnosis Date  . Allergic genetic state   . Arthritis   . 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 Breast mass 08/2006, 03/2009   left breast biopsy fibroadenoma (2008) right breast biopsy benign (2010)  . 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 Central serous retinopathy   . Chicken pox   . Depression   . Fatty liver   . Fatty liver   . Gastric polyps   . GERD (gastroesophageal reflux disease)   . Gestational diabetes   . Headache    migraines  . Heart murmur   . History of abnormal mammogram 08/2006, 01/2008, 03/2009  . Hypertension   . IBS (irritable bowel syndrome)   . Joint disorder    hx of left ankle tendonitis, bursitis, heel spur  . Obesity 2018   BMI 45  . Pelvic pain    adhesions and adenomyosis  . Personal history of radiation therapy   . Rosacea     Patient Active Problem List   Diagnosis Date Noted  . 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 OSA on CPAP 05/15/2018  . Non-alcoholic fatty  liver disease 03/06/2017  . Rosacea   . GERD (gastroesophageal reflux disease)   . Central serous retinopathy   . IBS (irritable bowel syndrome)   . Hyperlipidemia, mixed 01/24/2017  . Dyspnea on effort 12/22/2016  . Anxiety, generalized 04/29/2016  . Diabetes mellitus without complication ([place]) 99991111  . Cancer of the skin, basal cell 03/12/2016  . Contact dermatitis 04/23/2013  . Nodule, subcutaneous 07/24/2012  . Localized swelling, mass and lump, unspecified 07/24/2012  . Hypertension 12/20/2011  . Personal history of breast cancer 12/20/2011  . Obesity 12/20/2011    Past Surgical History:  Procedure Laterality Date  . BREAST BIOPSY Left 08/2006   benign fibroadenoma  . BREAST EXCISIONAL BIOPSY Right 02/26/2008   right breast invasive mam ca with rad partial mastectomy   . BREAST SURGERY Right    x2/ Dr Tamala Julian  . CARDIAC CATHETERIZATION  2006  . CESAREAN SECTION  04/02/1998  . CHOLECYSTECTOMY  04/2010   Dr. Smith/ laprascopic surgery  . COLONOSCOPY  04/04/2000  . COLONOSCOPY WITH PROPOFOL N/A 02/12/2019   Procedure: COLONOSCOPY WITH PROPOFOL;  Surgeon: Lollie Sails, MD;  Location: [facility] ENDOSCOPY;  Service: Endoscopy;  Laterality: N/A;  . ESOPHAGOGASTRODUODENOSCOPY (EGD) WITH PROPOFOL N/A 05/01/2015   Procedure: ESOPHAGOGASTRODUODENOSCOPY (EGD) WITH PROPOFOL;  Surgeon: Hulen Luster, MD;  Location: [facility] ENDOSCOPY;  Service: Gastroenterology;  Laterality: N/A;  . ESOPHAGOGASTRODUODENOSCOPY (EGD) WITH PROPOFOL N/A 02/12/2019   Procedure: ESOPHAGOGASTRODUODENOSCOPY (EGD) WITH PROPOFOL;  Surgeon: Lollie Sails, MD;  Location: [facility] ENDOSCOPY;  Service: Endoscopy;  Laterality: N/A;  . EYE SURGERY  08/2012   laser surgery on retina/ DR Appenzeller  . FINGER SURGERY     repair of tendon in right index, Dr. Francesco Sor in [place]  . FOOT SURGERY     Dr. Milinda Pointer  . IRRIGATION AND DEBRIDEMENT SEBACEOUS CYST     right upper back  . LAPAROSCOPIC SUPRACERVICAL HYSTERECTOMY   06/2007   Dr Kincius/ adhesions/CPP/adenomyosis  . MASTECTOMY PARTIAL / LUMPECTOMY Right 01/2008   with sentinal lymph node biopsy/ infiltrating ductal cancer  . REPAIR PERONEAL TENDONS ANKLE  10/2019   with tarsal exostectomy and sural neuroma excision on right   . SKIN CANCER EXCISION     back and calves  . [place]    Prior to Admission medications   Medication Sig Start Date End Date Taking? Authorizing Provider  acetaminophen (TYLENOL) 325 MG tablet Take 650 mg by mouth every 6 (six) hours as needed.    [provider]  amLODipine (NORVASC) 10 MG tablet Take 0.5 tablets (5 mg total) by mouth daily. 04/23/13   Jackolyn Confer, MD  buPROPion (WELLBUTRIN XL) 300 MG 24 hr tablet Take by mouth. 01/18/19 01/18/20  [provider]  Camphor-Eucalyptus-Menthol (VICKS VAPORUB) 4.7-1.2-2.6 % OINT     [provider]  citalopram (CELEXA) 10 MG tablet Take by mouth. 01/18/19 01/18/20  [provider]  citalopram (CELEXA) 20 MG tablet Take 20 mg by mouth daily.    [provider]  clotrimazole (CLOTRIMAZOLE ANTI-FUNGAL) 1 % cream Apply topically.    [provider]  diphenhydrAMINE (BENADRYL) 25 MG tablet Take by mouth.    [provider]  ibuprofen (ADVIL) 800 MG tablet TAKE 1 TABLET BY MOUTH EVERY 6 HOURS AS NEEDED. 11/20/19   Felipa Furnace, DPM  ibuprofen (ADVIL,MOTRIN) 200 MG tablet Take 200 mg by mouth every 6 (six) hours as needed.    [provider]  ketotifen (ZADITOR) 0.025 % ophthalmic solution Apply to eye.    [provider]  lisinopril (PRINIVIL,ZESTRIL) 10 MG tablet Take by mouth. 03/01/17   [provider]  metFORMIN (GLUCOPHAGE-XR) 500 MG 24 hr tablet  09/15/15   [provider]  metroNIDAZOLE (METROGEL) 0.75 % gel  06/24/15   [provider]  Multiple Vitamin (MULTIVITAMIN) tablet Take 1 tablet by mouth daily.    [provider]  omeprazole (PRILOSEC)  20 MG capsule Take 20 mg by mouth daily.    [provider]  PFIZER-BIONTECH COVID-19 VACC 30 MCG/0.3ML injection  05/23/20   [provider]    Allergies Kiwi extract, Other, Codeine, Amoxicillin, Erythromycin, Sulfa antibiotics, and Tape  Family History  Problem Relation Age of Onset  . Hypertension Mother   . Thyroid disease Mother   . Breast cancer Mother 55  . Colon cancer Mother 63       again at 52  . Atrial fibrillation Mother   . Hip fracture Mother   . Stroke Father   . Hypertension Father   . Cancer Father 52       prostate  . Parkinson's disease Father   . Hypertension Sister   . Thyroid disease Sister   . Cancer Sister  skin/ squamous cell  . Lung cancer Paternal Aunt 80       smoker  . Diabetes Maternal Grandmother   . Stroke Maternal Grandfather   . Heart disease Paternal Grandfather   . Diabetes Maternal Aunt   . Lymphoma Maternal Uncle   . Thyroid cancer Cousin        maternal  . Heart disease Maternal Uncle   . Cancer Maternal Aunt   . Breast cancer Maternal Aunt 75  . Breast cancer Cousin 52       maternal    Social History Social History   Tobacco Use  . Smoking status: Never Smoker  . Smokeless tobacco: Never Used  Vaping Use  . Vaping Use: Never used  Substance Use Topics  . Alcohol use: Yes    Alcohol/week: 0.0 standard drinks    Comment: rare, 1-2 drinks per year  . Drug use: No      Review of Systems Constitutional: No fever/chills Eyes: No visual changes. ENT: No sore throat. Cardiovascular: Positive chest pain Respiratory: + SOB Gastrointestinal: No abdominal pain.  No nausea, no vomiting.  No diarrhea.  No constipation. Genitourinary: Negative for dysuria. Musculoskeletal: Negative for back pain. Skin: Negative for rash. Neurological: Negative for headaches, focal weakness or numbness. All other ROS negative ____________________________________________   PHYSICAL EXAM:  VITAL SIGNS: ED  Triage Vitals  Enc Vitals Group     BP 07/22/20 1327 120/77     Pulse Rate 07/22/20 1327 79     Resp 07/22/20 1327 16     Temp 07/22/20 1327 99.1 F (37.3 C)     Temp Source 07/22/20 1327 Oral     SpO2 07/22/20 1327 99 %     Weight 07/22/20 1315 259 lb 0.7 oz (117.5 kg)     Height 07/22/20 1315 5\' 3"  (1.6 m)     Head Circumference --      Peak Flow --      Pain Score 07/22/20 1315 4     Pain Loc --      Pain Edu? --      Excl. in [place]? --     Constitutional: Alert and oriented. Well appearing and in no acute distress. Eyes: Conjunctivae are normal. EOMI. Head: Atraumatic. Nose: No congestion/rhinnorhea. Mouth/Throat: Mucous membranes are moist.   Neck: No stridor. Trachea Midline. FROM Cardiovascular: Normal rate, regular rhythm. Grossly normal heart sounds.  Good peripheral circulation.  Equal pulses in wrist and feet Respiratory: no audible stridor, work of breathing  Gastrointestinal: Soft and nontender. No distention. No abdominal bruits.  Musculoskeletal: No lower extremity tenderness nor edema.  No joint effusions. Neurologic:  Normal speech and language. No gross focal neurologic deficits are appreciated.  Skin:  Skin is warm, dry and intact. No rash noted. Psychiatric: Mood and affect are normal. Speech and behavior are normal. GU: Deferred   ____________________________________________   LABS (all labs ordered are listed, but only abnormal results are displayed)  Labs Reviewed  CBC - Abnormal; Notable for the following components:      Result Value   RBC 5.29 (*)    MCV 77.5 (*)    MCH 23.6 (*)    RDW 18.6 (*)    All other components within normal limits  BASIC METABOLIC PANEL  TROPONIN I (HIGH SENSITIVITY)  TROPONIN I (HIGH SENSITIVITY)   ____________________________________________   ED ECG REPORT I, Vanessa [place], the attending physician, personally viewed and interpreted this ECG.  Normal sinus rate of 71, no  ST elevation, no T wave versions, normal  intervals ____________________________________________  RADIOLOGY Robert Bellow, personally viewed and evaluated these images (plain radiographs) as part of my medical decision making, as well as reviewing the written report by the radiologist.  ED MD interpretation: Normal lung  Official radiology report(s): DG Chest 1 View  Result Date: 07/22/2020 CLINICAL DATA:  Chest pain and shortness of breath EXAM: CHEST  1 VIEW COMPARISON:  Chest CT June 16, 2009 FINDINGS: Lungs are clear. Heart is upper normal in size with pulmonary vascularity normal. No adenopathy. No pneumothorax. No bone lesions. IMPRESSION: Lungs clear.  Heart upper normal in size. Electronically Signed   By: Lowella Grip III M.D.   On: 07/22/2020 15:19   CT Angio Chest PE W and/or Wo Contrast  Result Date: 07/22/2020 CLINICAL DATA:  PE suspected, high prob Cough and shortness of breath since COVID diagnosis 8 days ago EXAM: CT ANGIOGRAPHY CHEST WITH CONTRAST TECHNIQUE: Multidetector CT imaging of the chest was performed using the standard protocol during bolus administration of intravenous contrast. Multiplanar CT image reconstructions and MIPs were obtained to evaluate the vascular anatomy. CONTRAST:  55mL OMNIPAQUE IOHEXOL 350 MG/ML SOLN COMPARISON:  Radiograph earlier today. FINDINGS: Cardiovascular: There are no filling defects within the pulmonary arteries to suggest pulmonary embolus. Normal caliber thoracic aorta. No dissection or acute aortic findings. Upper normal heart size. No pericardial effusion. Mediastinum/Nodes: There is an abnormal right lobulated lymph node/axillary mass measuring 2.5 cm, series 4, image 24. Prominent adjacent axillary lymph nodes. There is mild adjacent tissue edema. No obvious breast mass. No mediastinal or hilar adenopathy. No visualized thyroid nodule. No esophageal wall thickening, tiny hiatal hernia. Lungs/Pleura: 2 smoothly marginated perifissural nodules adjacent to the right minor  and major fissure are most consistent with intrapulmonary lymph nodes. No focal airspace disease or ground-glass opacity typically seen with COVID pneumonia. No findings of pulmonary edema. No pleural fluid. Upper Abdomen: Prominent liver with steatosis partially included. No acute upper abdominal findings. Musculoskeletal: Thoracic spondylosis. There are no acute or suspicious osseous abnormalities. Review of the MIP images confirms the above findings. IMPRESSION: 1. No pulmonary embolus. 2. No evidence of pneumonia. 3. Abnormal right axillary lymph node/axillary mass measuring 2.5 cm with mild adjacent edema. Prominent adjacent axillary lymph nodes. Patient has history of right breast cancer. A right axillary ultrasound in September 2021 demonstrated no focal abnormality. Recommend repeat targeted axillary ultrasound at the breast center. 4. Incidental note of hepatic steatosis. Electronically Signed   By: Keith Rake M.D.   On: 07/22/2020 17:53    ____________________________________________   PROCEDURES  Procedure(s) performed (including Critical Care):  Procedures   ____________________________________________   INITIAL IMPRESSION / ASSESSMENT AND PLAN / ED COURSE  KALLEE NAM was evaluated in Emergency Department on 07/22/2020 for the symptoms described in the history of present illness. She was evaluated in the context of the global COVID-19 pandemic, which necessitated consideration that the patient might be at risk for infection with the SARS-CoV-2 virus that causes COVID-19. Institutional protocols and algorithms that pertain to the evaluation of patients at risk for COVID-19 are in a state of rapid change based on information released by regulatory bodies including the CDC and federal and state organizations. These policies and algorithms were followed during the patient's care in the ED.     Pt presents with SOB and chest pain.  Will get cardiac markers and EKG to evaluate for  ACS.  Given patient's now day 9 of illness  will get CT scan to make sure no evidence of large dissection versus PE although my suspicion is relatively low.  Will get labs evaluate for anemia.  Hemoglobin is normal.  Cardiac markers are negative x2.  CT PE is not negative.  Patient does have an abnormal lymph node recommended repeat ultrasound outpatient.  Provided copy of these results to patient and she expressed understanding.  We will give patient dose of Toradol.  Patient felt comfortable with this plan and is okay with discharge home   ____________________________________________   FINAL CLINICAL IMPRESSION(S) / ED DIAGNOSES   Final diagnoses:  COVID-19  Chest pain, unspecified type      MEDICATIONS GIVEN DURING THIS VISIT:  Medications  ketorolac (TORADOL) 30 MG/ML injection 15 mg (has no administration in time range)  acetaminophen (TYLENOL) tablet 1,000 mg (1,000 mg Oral Given 07/22/20 1617)  iohexol (OMNIPAQUE) 350 MG/ML injection 75 mL (75 mLs Intravenous Contrast Given 07/22/20 1731)     ED Discharge Orders    None       Note:  This document was prepared using Dragon voice recognition software and may include unintentional dictation errors.   Vanessa [place], MD 07/22/20 [phone]

## 2020-07-22 NOTE — ED Notes (Signed)
Patient to CT at this time

## 2020-07-22 NOTE — Discharge Instructions (Signed)
No evidence of heart attack.  CT scan was negative.  Follow-up with the incidental findings below    1. No pulmonary embolus.  2. No evidence of pneumonia.  3. Abnormal right axillary lymph node/axillary mass measuring 2.5 cm  with mild adjacent edema. Prominent adjacent axillary lymph nodes.  Patient has history of right breast cancer. A right axillary  ultrasound in September 2021 demonstrated no focal abnormality.  Recommend repeat targeted axillary ultrasound at the breast center.  4. Incidental note of hepatic steatosis.

## 2020-07-22 NOTE — ED Triage Notes (Signed)
First Nurse Note:  Arrives from [facility] for evaluation of CP and SOB that has been ongoing since diagnosed with COVID on 07/14/2020.  Patient is AAOx3.  Skin warm and dry.  .  No SOB/ DOE.  NAD

## 2020-07-22 NOTE — ED Notes (Signed)
Unable to get IV access by this RN and Claiborne Billings, Therapist, sports.

## 2020-07-25 ENCOUNTER — Other Ambulatory Visit: Payer: Self-pay | Admitting: Family Medicine

## 2020-07-25 DIAGNOSIS — U099 Post covid-19 condition, unspecified: Secondary | ICD-10-CM | POA: Diagnosis not present

## 2020-07-25 DIAGNOSIS — R0600 Dyspnea, unspecified: Secondary | ICD-10-CM | POA: Diagnosis not present

## 2020-07-25 DIAGNOSIS — R0609 Other forms of dyspnea: Secondary | ICD-10-CM | POA: Diagnosis not present

## 2020-07-29 ENCOUNTER — Telehealth: Payer: Self-pay | Admitting: Obstetrics and Gynecology

## 2020-07-29 DIAGNOSIS — R0609 Other forms of dyspnea: Secondary | ICD-10-CM | POA: Diagnosis not present

## 2020-07-29 DIAGNOSIS — U099 Post covid-19 condition, unspecified: Secondary | ICD-10-CM | POA: Diagnosis not present

## 2020-07-29 DIAGNOSIS — R590 Localized enlarged lymph nodes: Secondary | ICD-10-CM

## 2020-07-29 NOTE — Telephone Encounter (Signed)
Pt with recent chest CT for SOB/Covid. Revealed Mediastinum/Nodes: There is an abnormal right lobulated lymph node/axillary mass measuring 2.5 cm, series 4, image 24. Prominent adjacent axillary lymph nodes. There is mild adjacent tissue edema. No obvious breast mass. No mediastinal or hilar adenopathy.  Pt with hx of RT breast cancer. Had neg RT axillary u/s 9/21. Due for repeat due to recent findings. Order placed, discussed with [place] at [place]. Will f/u with results.

## 2020-08-05 DIAGNOSIS — M7072 Other bursitis of hip, left hip: Secondary | ICD-10-CM | POA: Diagnosis not present

## 2020-08-05 DIAGNOSIS — M25552 Pain in left hip: Secondary | ICD-10-CM | POA: Diagnosis not present

## 2020-08-05 DIAGNOSIS — M5451 Vertebrogenic low back pain: Secondary | ICD-10-CM | POA: Diagnosis not present

## 2020-08-05 DIAGNOSIS — M9905 Segmental and somatic dysfunction of pelvic region: Secondary | ICD-10-CM | POA: Diagnosis not present

## 2020-08-05 DIAGNOSIS — R519 Headache, unspecified: Secondary | ICD-10-CM | POA: Diagnosis not present

## 2020-08-05 DIAGNOSIS — M9903 Segmental and somatic dysfunction of lumbar region: Secondary | ICD-10-CM | POA: Diagnosis not present

## 2020-08-05 DIAGNOSIS — M9904 Segmental and somatic dysfunction of sacral region: Secondary | ICD-10-CM | POA: Diagnosis not present

## 2020-08-05 DIAGNOSIS — M5412 Radiculopathy, cervical region: Secondary | ICD-10-CM | POA: Diagnosis not present

## 2020-08-05 DIAGNOSIS — M9901 Segmental and somatic dysfunction of cervical region: Secondary | ICD-10-CM | POA: Diagnosis not present

## 2020-08-06 ENCOUNTER — Other Ambulatory Visit: Payer: Self-pay

## 2020-08-06 ENCOUNTER — Ambulatory Visit
Admission: RE | Admit: 2020-08-06 | Discharge: 2020-08-06 | Disposition: A | Discharge status: Home / Self Care | Payer: 59 | Source: Ambulatory Visit | Attending: Obstetrics and Gynecology | Admitting: Obstetrics and Gynecology

## 2020-08-06 ENCOUNTER — Other Ambulatory Visit: Payer: Self-pay | Admitting: Obstetrics and Gynecology

## 2020-08-06 DIAGNOSIS — R590 Localized enlarged lymph nodes: Secondary | ICD-10-CM | POA: Diagnosis not present

## 2020-08-06 DIAGNOSIS — R2231 Localized swelling, mass and lump, right upper limb: Secondary | ICD-10-CM

## 2020-08-06 DIAGNOSIS — Z853 Personal history of malignant neoplasm of breast: Secondary | ICD-10-CM | POA: Diagnosis not present

## 2020-08-06 DIAGNOSIS — R928 Other abnormal and inconclusive findings on diagnostic imaging of breast: Secondary | ICD-10-CM

## 2020-08-06 NOTE — Progress Notes (Signed)
Report

## 2020-08-07 ENCOUNTER — Encounter: Payer: Self-pay | Admitting: Emergency Medicine

## 2020-08-07 ENCOUNTER — Emergency Department: Payer: 59

## 2020-08-07 ENCOUNTER — Observation Stay
Admission: EM | Admit: 2020-08-07 | Discharge: 2020-08-08 | Disposition: A | Discharge status: Home / Self Care | Payer: 59 | Attending: Internal Medicine | Admitting: Internal Medicine

## 2020-08-07 ENCOUNTER — Other Ambulatory Visit: Payer: Self-pay

## 2020-08-07 DIAGNOSIS — U071 COVID-19: Secondary | ICD-10-CM | POA: Diagnosis present

## 2020-08-07 DIAGNOSIS — Z20822 Contact with and (suspected) exposure to covid-19: Secondary | ICD-10-CM | POA: Insufficient documentation

## 2020-08-07 DIAGNOSIS — I10 Essential (primary) hypertension: Secondary | ICD-10-CM | POA: Diagnosis present

## 2020-08-07 DIAGNOSIS — I4891 Unspecified atrial fibrillation: Secondary | ICD-10-CM | POA: Diagnosis not present

## 2020-08-07 DIAGNOSIS — R0602 Shortness of breath: Secondary | ICD-10-CM | POA: Diagnosis not present

## 2020-08-07 DIAGNOSIS — Z7984 Long term (current) use of oral hypoglycemic drugs: Secondary | ICD-10-CM | POA: Insufficient documentation

## 2020-08-07 DIAGNOSIS — Z8616 Personal history of COVID-19: Secondary | ICD-10-CM | POA: Diagnosis not present

## 2020-08-07 DIAGNOSIS — G4733 Obstructive sleep apnea (adult) (pediatric): Secondary | ICD-10-CM

## 2020-08-07 DIAGNOSIS — F32A Depression, unspecified: Secondary | ICD-10-CM | POA: Diagnosis present

## 2020-08-07 DIAGNOSIS — I483 Typical atrial flutter: Principal | ICD-10-CM | POA: Insufficient documentation

## 2020-08-07 DIAGNOSIS — R0989 Other specified symptoms and signs involving the circulatory and respiratory systems: Secondary | ICD-10-CM | POA: Diagnosis not present

## 2020-08-07 DIAGNOSIS — R002 Palpitations: Secondary | ICD-10-CM | POA: Diagnosis not present

## 2020-08-07 DIAGNOSIS — Z9989 Dependence on other enabling machines and devices: Secondary | ICD-10-CM | POA: Diagnosis not present

## 2020-08-07 DIAGNOSIS — I4892 Unspecified atrial flutter: Secondary | ICD-10-CM | POA: Diagnosis present

## 2020-08-07 DIAGNOSIS — R079 Chest pain, unspecified: Secondary | ICD-10-CM

## 2020-08-07 DIAGNOSIS — Z85828 Personal history of other malignant neoplasm of skin: Secondary | ICD-10-CM | POA: Diagnosis not present

## 2020-08-07 DIAGNOSIS — E119 Type 2 diabetes mellitus without complications: Secondary | ICD-10-CM | POA: Diagnosis not present

## 2020-08-07 DIAGNOSIS — K219 Gastro-esophageal reflux disease without esophagitis: Secondary | ICD-10-CM | POA: Diagnosis not present

## 2020-08-07 DIAGNOSIS — Z79899 Other long term (current) drug therapy: Secondary | ICD-10-CM | POA: Insufficient documentation

## 2020-08-07 DIAGNOSIS — Z853 Personal history of malignant neoplasm of breast: Secondary | ICD-10-CM | POA: Diagnosis not present

## 2020-08-07 DIAGNOSIS — K7460 Unspecified cirrhosis of liver: Secondary | ICD-10-CM | POA: Diagnosis not present

## 2020-08-07 DIAGNOSIS — R000 Tachycardia, unspecified: Secondary | ICD-10-CM | POA: Diagnosis not present

## 2020-08-07 LAB — URINALYSIS, COMPLETE (UACMP) WITH MICROSCOPIC
Bilirubin Urine: NEGATIVE
Glucose, UA: NEGATIVE mg/dL
Hgb urine dipstick: NEGATIVE
Ketones, ur: NEGATIVE mg/dL
Leukocytes,Ua: NEGATIVE
Nitrite: NEGATIVE
Protein, ur: NEGATIVE mg/dL
Specific Gravity, Urine: 1.002 — ABNORMAL LOW (ref 1.005–1.030)
pH: 7 (ref 5.0–8.0)

## 2020-08-07 LAB — HIV ANTIBODY (ROUTINE TESTING W REFLEX): HIV Screen 4th Generation wRfx: NONREACTIVE

## 2020-08-07 LAB — CBC WITH DIFFERENTIAL/PLATELET
Abs Immature Granulocytes: 0.02 10*3/uL (ref 0.00–0.07)
Basophils Absolute: 0 10*3/uL (ref 0.0–0.1)
Basophils Relative: 1 %
Eosinophils Absolute: 0.3 10*3/uL (ref 0.0–0.5)
Eosinophils Relative: 4 %
HCT: 40.1 % (ref 36.0–46.0)
Hemoglobin: 12.6 g/dL (ref 12.0–15.0)
Immature Granulocytes: 0 %
Lymphocytes Relative: 25 %
Lymphs Abs: 1.6 10*3/uL (ref 0.7–4.0)
MCH: 24.3 pg — ABNORMAL LOW (ref 26.0–34.0)
MCHC: 31.4 g/dL (ref 30.0–36.0)
MCV: 77.4 fL — ABNORMAL LOW (ref 80.0–100.0)
Monocytes Absolute: 0.7 10*3/uL (ref 0.1–1.0)
Monocytes Relative: 11 %
Neutro Abs: 4 10*3/uL (ref 1.7–7.7)
Neutrophils Relative %: 59 %
Platelets: 308 10*3/uL (ref 150–400)
RBC: 5.18 MIL/uL — ABNORMAL HIGH (ref 3.87–5.11)
RDW: 18.9 % — ABNORMAL HIGH (ref 11.5–15.5)
WBC: 6.7 10*3/uL (ref 4.0–10.5)
nRBC: 0 % (ref 0.0–0.2)

## 2020-08-07 LAB — TROPONIN I (HIGH SENSITIVITY): Troponin I (High Sensitivity): 4 ng/L (ref <18)

## 2020-08-07 LAB — GLUCOSE, CAPILLARY
Glucose-Capillary: 96 mg/dL (ref 70–99)
Glucose-Capillary: 133 mg/dL — ABNORMAL HIGH (ref 70–99)

## 2020-08-07 LAB — TSH: TSH: 3.816 u[IU]/mL (ref 0.350–4.500)

## 2020-08-07 LAB — COMPREHENSIVE METABOLIC PANEL
ALT: 19 U/L (ref 0–44)
AST: 24 U/L (ref 15–41)
Albumin: 3.8 g/dL (ref 3.5–5.0)
Alkaline Phosphatase: 114 U/L (ref 38–126)
Anion gap: 13 (ref 5–15)
BUN: 13 mg/dL (ref 6–20)
CO2: 23 mmol/L (ref 22–32)
Calcium: 9.3 mg/dL (ref 8.9–10.3)
Chloride: 105 mmol/L (ref 98–111)
Creatinine, Ser: 0.81 mg/dL (ref 0.44–1.00)
GFR, Estimated: >60 mL/min (ref >60)
Glucose, Bld: 105 mg/dL — ABNORMAL HIGH (ref 70–99)
Potassium: 4.4 mmol/L (ref 3.5–5.1)
Sodium: 141 mmol/L (ref 135–145)
Total Bilirubin: 0.6 mg/dL (ref 0.3–1.2)
Total Protein: 7.6 g/dL (ref 6.5–8.1)

## 2020-08-07 LAB — PROTIME-INR
INR: 1.1 (ref 0.8–1.2)
Prothrombin Time: 13.8 seconds (ref 11.4–15.2)

## 2020-08-07 LAB — APTT: aPTT: 32 seconds (ref 24–36)

## 2020-08-07 LAB — CBG MONITORING, ED: Glucose-Capillary: 125 mg/dL — ABNORMAL HIGH (ref 70–99)

## 2020-08-07 LAB — MAGNESIUM: Magnesium: 2 mg/dL (ref 1.7–2.4)

## 2020-08-07 LAB — SARS CORONAVIRUS 2 BY RT PCR (HOSPITAL ORDER, PERFORMED IN ~~LOC~~ HOSPITAL LAB): SARS Coronavirus 2: NEGATIVE

## 2020-08-07 MED ORDER — DILTIAZEM HCL-DEXTROSE 125-5 MG/125ML-% IV SOLN (PREMIX)
INTRAVENOUS | Status: AC
  Administered 2020-08-07: 10 mg via INTRAVENOUS
  Filled 2020-08-07: qty 125

## 2020-08-07 MED ORDER — DILTIAZEM HCL 25 MG/5ML IV SOLN
10.0000 mg | Freq: Once | INTRAVENOUS | Status: AC
  Administered 2020-08-07: 10 mg via INTRAVENOUS

## 2020-08-07 MED ORDER — METOPROLOL TARTRATE 5 MG/5ML IV SOLN
2.5000 mg | Freq: Once | INTRAVENOUS | Status: DC
  Filled 2020-08-07: qty 5

## 2020-08-07 MED ORDER — AMIODARONE LOAD VIA INFUSION
150.0000 mg | Freq: Once | INTRAVENOUS | Status: DC
  Filled 2020-08-07: qty 83.34

## 2020-08-07 MED ORDER — DILTIAZEM LOAD VIA INFUSION: 10.0000 mg | Freq: Once | INTRAVENOUS | Status: DC

## 2020-08-07 MED ORDER — INSULIN ASPART 100 UNIT/ML ~~LOC~~ SOLN: 0.0000 [IU] | Freq: Every day | SUBCUTANEOUS | Status: DC

## 2020-08-07 MED ORDER — METOPROLOL TARTRATE 5 MG/5ML IV SOLN: 2.5000 mg | INTRAVENOUS | Status: DC | PRN

## 2020-08-07 MED ORDER — DILTIAZEM LOAD VIA INFUSION
20.0000 mg | Freq: Once | INTRAVENOUS | Status: DC
Start: 2020-08-07 — End: 2020-08-07

## 2020-08-07 MED ORDER — ONDANSETRON HCL 4 MG PO TABS: 4.0000 mg | ORAL_TABLET | Freq: Four times a day (QID) | ORAL | Status: DC | PRN

## 2020-08-07 MED ORDER — DILTIAZEM HCL-DEXTROSE 125-5 MG/125ML-% IV SOLN (PREMIX)
5.0000 mg/h | INTRAVENOUS | Status: DC
Start: 2020-08-07 — End: 2020-08-07

## 2020-08-07 MED ORDER — IBUPROFEN 400 MG PO TABS
400.0000 mg | ORAL_TABLET | Freq: Four times a day (QID) | ORAL | Status: DC | PRN
  Filled 2020-08-07 (×2): qty 1

## 2020-08-07 MED ORDER — DILTIAZEM HCL-DEXTROSE 125-5 MG/125ML-% IV SOLN (PREMIX): 5.0000 mg/h | INTRAVENOUS | Status: DC

## 2020-08-07 MED ORDER — ZINC SULFATE 220 (50 ZN) MG PO CAPS
220.0000 mg | ORAL_CAPSULE | Freq: Every day | ORAL | Status: DC
  Administered 2020-08-07 – 2020-08-08 (×2): 220 mg via ORAL
  Filled 2020-08-07 (×2): qty 1

## 2020-08-07 MED ORDER — AMIODARONE HCL IN DEXTROSE 360-4.14 MG/200ML-% IV SOLN: 30.0000 mg/h | INTRAVENOUS | Status: DC

## 2020-08-07 MED ORDER — ENOXAPARIN SODIUM 60 MG/0.6ML ~~LOC~~ SOLN
0.5000 mg/kg | SUBCUTANEOUS | Status: DC
  Administered 2020-08-07 – 2020-08-08 (×2): 57.5 mg via SUBCUTANEOUS
  Filled 2020-08-07 (×2): qty 0.6

## 2020-08-07 MED ORDER — CITALOPRAM HYDROBROMIDE 20 MG PO TABS
40.0000 mg | ORAL_TABLET | Freq: Every day | ORAL | Status: DC
  Administered 2020-08-07: 40 mg via ORAL
  Filled 2020-08-07: qty 2

## 2020-08-07 MED ORDER — KETOTIFEN FUMARATE 0.025 % OP SOLN
1.0000 [drp] | Freq: Every day | OPHTHALMIC | Status: DC
  Filled 2020-08-07: qty 5

## 2020-08-07 MED ORDER — DILTIAZEM HCL-DEXTROSE 125-5 MG/125ML-% IV SOLN (PREMIX)
5.0000 mg/h | INTRAVENOUS | Status: DC
  Administered 2020-08-07: 5 mg/h via INTRAVENOUS
  Administered 2020-08-07 (×2): 15 mg/h via INTRAVENOUS
  Filled 2020-08-07 (×2): qty 125

## 2020-08-07 MED ORDER — BUPROPION HCL ER (XL) 150 MG PO TB24
300.0000 mg | ORAL_TABLET | Freq: Every day | ORAL | Status: DC
  Administered 2020-08-07: 300 mg via ORAL
  Filled 2020-08-07: qty 2

## 2020-08-07 MED ORDER — DILTIAZEM LOAD VIA INFUSION
10.0000 mg | Freq: Once | INTRAVENOUS | Status: AC
  Administered 2020-08-07: 10 mg via INTRAVENOUS
  Filled 2020-08-07: qty 10

## 2020-08-07 MED ORDER — ADULT MULTIVITAMIN W/MINERALS CH
1.0000 | ORAL_TABLET | Freq: Every day | ORAL | Status: DC
  Administered 2020-08-08: 1 via ORAL
  Filled 2020-08-07: qty 1

## 2020-08-07 MED ORDER — PANTOPRAZOLE SODIUM 40 MG PO TBEC
40.0000 mg | DELAYED_RELEASE_TABLET | Freq: Every day | ORAL | Status: DC
  Administered 2020-08-08: 40 mg via ORAL
  Filled 2020-08-07: qty 1

## 2020-08-07 MED ORDER — INSULIN ASPART 100 UNIT/ML ~~LOC~~ SOLN
0.0000 [IU] | Freq: Three times a day (TID) | SUBCUTANEOUS | Status: DC
  Administered 2020-08-08: 1 [IU] via SUBCUTANEOUS
  Filled 2020-08-07: qty 1

## 2020-08-07 MED ORDER — AMIODARONE HCL IN DEXTROSE 360-4.14 MG/200ML-% IV SOLN
60.0000 mg/h | INTRAVENOUS | Status: AC
  Filled 2020-08-07: qty 200

## 2020-08-07 MED ORDER — ASCORBIC ACID 500 MG PO TABS
500.0000 mg | ORAL_TABLET | Freq: Every day | ORAL | Status: DC
  Administered 2020-08-07 – 2020-08-08 (×2): 500 mg via ORAL
  Filled 2020-08-07 (×2): qty 1

## 2020-08-07 MED ORDER — ACETAMINOPHEN 325 MG PO TABS
650.0000 mg | ORAL_TABLET | Freq: Four times a day (QID) | ORAL | Status: DC | PRN
  Administered 2020-08-07 – 2020-08-08 (×2): 650 mg via ORAL
  Filled 2020-08-07 (×3): qty 2

## 2020-08-07 MED ORDER — DILTIAZEM LOAD VIA INFUSION
10.0000 mg | Freq: Once | INTRAVENOUS | Status: AC
  Filled 2020-08-07: qty 10

## 2020-08-07 MED ORDER — DIPHENHYDRAMINE HCL 25 MG PO TABS
25.0000 mg | ORAL_TABLET | Freq: Every evening | ORAL | Status: DC | PRN
  Administered 2020-08-07: 25 mg via ORAL
  Filled 2020-08-07 (×3): qty 1

## 2020-08-07 NOTE — H&P (Signed)
History and Physical    Jacqueline Deleon YSA:[id number] DOB: [birth year] DOA: 08/07/2020  Referring MD/NP/PA:   PCP: Derinda Late, MD   Patient coming from:  The patient is coming from home.  At baseline, pt is independent for most of ADL.        Chief Complaint: Palpitation  HPI: Jacqueline Deleon is a 60 y.o. female with medical history significant of hypertension, hyperlipidemia, diabetes mellitus, GERD, depression, anxiety, IBS, breast cancer, liver cirrhosis due to [place], OSA on CPAP, who presents with palpitation.  Pt states that she suddenly developed palpitations that awoke her from sleep this morning at about 4 AM.  She reports that it is associated mild chest discomfort and SOB.  No cough, fever or chills.  Denies nausea vomiting, diarrhea, abdominal pain no symptoms of UTI. Patient states that she had positive Covid test on 07/14/2020. Patient was found to have new onset of atrial flutter with heart rates up to 140s.  Cardizem drip is started in ED  Of note, pt had hx of breast cancer.  Recent discovery of right-sided axillary lymphadenopathy and outpatient ultrasound of the breast and axilla performed yesterday demonstrates a 2.8 cm suspicious irregular nodule.  Patient has a biopsy of this nodule scheduled for Monday 1/31.   ED Course: pt was found to have troponin level 4, WBC 6.7, negative urinalysis, pending COVID-19 PCR, electrolytes renal function okay, temperature 99.2, blood pressure 116/105, RR 20, oxygen saturation 97% on room air.  Chest x-ray negative.  Patient is placed on progressive bed for observation.  Dr. Clayborn Bigness of cardiology is consulted  Review of Systems:   General: no fevers, chills, no body weight gain, has fatigue HEENT: no blurry vision, hearing changes or sore throat Respiratory: has dyspnea, no coughing, wheezing CV: has chest discomfort, palpitations GI: no nausea, vomiting, abdominal pain, diarrhea, constipation GU: no dysuria, burning on urination,  increased urinary frequency, hematuria  Ext: no leg edema Neuro: no unilateral weakness, numbness, or tingling, no vision change or hearing loss Skin: no rash, no skin tear. MSK: No muscle spasm, no deformity, no limitation of range of movement in spin Heme: No easy bruising.  Travel history: No recent long distant travel.  Allergy:  Allergies  Allergen Reactions  . Kiwi Extract Itching and Swelling  . Codeine Other (See Comments)    Felt funny.  . Grapeseed Extract [Nutritional Supplements] Itching and Swelling    Grapes  . Amoxicillin Rash  . Erythromycin Rash  . Sulfa Antibiotics Rash  . Tape Rash    adhesives    Past Medical History:  Diagnosis Date  . Allergic genetic state   . Arthritis   . 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 Breast mass 08/2006, 03/2009   left breast biopsy fibroadenoma (2008) right breast biopsy benign (2010)  . 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 Central serous retinopathy   . Chicken pox   . Depression   . Fatty liver   . Fatty liver   . Gastric polyps   . GERD (gastroesophageal reflux disease)   . Gestational diabetes   . Headache    migraines  . Heart murmur   . History of abnormal mammogram 08/2006, 01/2008, 03/2009  . Hypertension   . IBS (irritable bowel syndrome)   . Joint disorder    hx of left ankle tendonitis, bursitis, heel spur  . Obesity 2018  BMI 45  . Pelvic pain    adhesions and adenomyosis  . Personal history of radiation therapy   . Rosacea     Past Surgical History:  Procedure Laterality Date  . BREAST BIOPSY Left 08/2006   benign fibroadenoma  . BREAST EXCISIONAL BIOPSY Right 02/26/2008   right breast invasive mam ca with rad partial mastectomy   . BREAST SURGERY Right    x2/ Dr Tamala Julian  . CARDIAC CATHETERIZATION  2006  . CESAREAN SECTION  04/02/1998  . CHOLECYSTECTOMY  04/2010   Dr. Smith/ laprascopic surgery  . COLONOSCOPY  04/04/2000  .  COLONOSCOPY WITH PROPOFOL N/A 02/12/2019   Procedure: COLONOSCOPY WITH PROPOFOL;  Surgeon: Lollie Sails, MD;  Location: [facility] ENDOSCOPY;  Service: Endoscopy;  Laterality: N/A;  . ESOPHAGOGASTRODUODENOSCOPY (EGD) WITH PROPOFOL N/A 05/01/2015   Procedure: ESOPHAGOGASTRODUODENOSCOPY (EGD) WITH PROPOFOL;  Surgeon: Hulen Luster, MD;  Location[facility] ENDOSCOPY;  Service: Gastroenterology;  Laterality: N/A;  . ESOPHAGOGASTRODUODENOSCOPY (EGD) WITH PROPOFOL N/A 02/12/2019   Procedure: ESOPHAGOGASTRODUODENOSCOPY (EGD) WITH PROPOFOL;  Surgeon: Lollie Sails, MD;  Location: [facility] ENDOSCOPY;  Service: Endoscopy;  Laterality: N/A;  . EYE SURGERY  08/2012   laser surgery on retina/ DR Appenzeller  . FINGER SURGERY     repair of tendon in right index, Dr. Francesco Sor in [place]  . FOOT SURGERY     Dr. Milinda Pointer  . IRRIGATION AND DEBRIDEMENT SEBACEOUS CYST     right upper back  . LAPAROSCOPIC SUPRACERVICAL HYSTERECTOMY  06/2007   Dr Kincius/ adhesions/CPP/adenomyosis  . MASTECTOMY PARTIAL / LUMPECTOMY Right 01/2008   with sentinal lymph node biopsy/ infiltrating ductal cancer  . REPAIR PERONEAL TENDONS ANKLE  10/2019   with tarsal exostectomy and sural neuroma excision on right   . SKIN CANCER EXCISION     back and calves  . [place] EXTRACTION  1991    Social History:  reports that she has never smoked. She has never used smokeless tobacco. She reports current alcohol use. She reports that she does not use drugs.  Family History:  Family History  Problem Relation Age of Onset  . Hypertension Mother   . Thyroid disease Mother   . Breast cancer Mother 14  . Colon cancer Mother 68       again at 36  . Atrial fibrillation Mother   . Hip fracture Mother   . Stroke Father   . Hypertension Father   . Cancer Father 52       prostate  . Parkinson's disease Father   . Hypertension Sister   . Thyroid disease Sister   . Cancer Sister        skin/ squamous cell  . Lung cancer Paternal Aunt 80        smoker  . Diabetes Maternal Grandmother   . Stroke Maternal Grandfather   . Heart disease Paternal Grandfather   . Diabetes Maternal Aunt   . Lymphoma Maternal Uncle   . Thyroid cancer Cousin        maternal  . Heart disease Maternal Uncle   . Cancer Maternal Aunt   . Breast cancer Maternal Aunt 75  . Breast cancer Cousin 60       maternal     Prior to Admission medications   Medication Sig Start Date End Date Taking? Authorizing Provider  acetaminophen (TYLENOL) 325 MG tablet Take 650 mg by mouth every 6 (six) hours as needed.    [provider]  amLODipine (NORVASC) 10 MG tablet Take  0.5 tablets (5 mg total) by mouth daily. 04/23/13   Jackolyn Confer, MD  buPROPion (WELLBUTRIN XL) 300 MG 24 hr tablet Take by mouth. 01/18/19 01/18/20  [provider]  Camphor-Eucalyptus-Menthol (VICKS VAPORUB) 4.7-1.2-2.6 % OINT     [provider]  citalopram (CELEXA) 10 MG tablet Take by mouth. 01/18/19 01/18/20  [provider]  citalopram (CELEXA) 20 MG tablet Take 20 mg by mouth daily.    [provider]  clotrimazole (CLOTRIMAZOLE ANTI-FUNGAL) 1 % cream Apply topically.    [provider]  diphenhydrAMINE (BENADRYL) 25 MG tablet Take by mouth.    [provider]  ibuprofen (ADVIL) 800 MG tablet TAKE 1 TABLET BY MOUTH EVERY 6 HOURS AS NEEDED. 11/20/19   Felipa Furnace, DPM  ibuprofen (ADVIL,MOTRIN) 200 MG tablet Take 200 mg by mouth every 6 (six) hours as needed.    [provider]  ketotifen (ZADITOR) 0.025 % ophthalmic solution Apply to eye.    [provider]  lisinopril (PRINIVIL,ZESTRIL) 10 MG tablet Take by mouth. 03/01/17   [provider]  metFORMIN (GLUCOPHAGE-XR) 500 MG 24 hr tablet  09/15/15   [provider]  metroNIDAZOLE (METROGEL) 0.75 % gel  06/24/15   [provider]  Multiple Vitamin (MULTIVITAMIN) tablet Take 1 tablet by mouth daily.    [provider]  omeprazole  (PRILOSEC) 20 MG capsule Take 20 mg by mouth daily.    [provider]  PFIZER-BIONTECH COVID-19 VACC 30 MCG/0.3ML injection  05/23/20   [provider]    Physical Exam: Vitals:   08/07/20 1558 08/07/20 1948 08/08/20 0019 08/08/20 0516  BP: 123/72 136/72 112/64 119/70  Pulse: 69 65 61 69  Resp: 19 19 19 20   Temp: 97.7 F (36.5 C) 98.2 F (36.8 C) 98.5 F (36.9 C) 98.2 F (36.8 C)  TempSrc: Oral Oral Oral Oral  SpO2: 99% 97% 97% 99%  Weight:    116.6 kg  Height:       General: Not in acute distress HEENT:       Eyes: PERRL, EOMI, no scleral icterus.       ENT: No discharge from the ears and nose, no pharynx injection, no tonsillar enlargement.        Neck: No JVD, no bruit, no mass felt. Heme: No neck lymph node enlargement. Cardiac: S1/S2, RRR, No gallops or rubs. Respiratory: No rales, wheezing, rhonchi or rubs. GI: Soft, nondistended, nontender, no rebound pain, no organomegaly, BS present. GU: No hematuria Ext: No pitting leg edema bilaterally. 1+DP/PT pulse bilaterally. Musculoskeletal: No joint deformities, No joint redness or warmth, no limitation of ROM in spin. Skin: No rashes.  Neuro: Alert, oriented X3, cranial nerves II-XII grossly intact, moves all extremities normally. Psych: Patient is not psychotic, no suicidal or hemocidal ideation.  Labs on Admission: I have personally reviewed following labs and imaging studies  CBC: Recent Labs  Lab 08/07/20 0544 08/08/20 0550  WBC 6.7 5.7  NEUTROABS 4.0  --   HGB 12.6 11.7*  HCT 40.1 37.6  MCV 77.4* 78.7*  PLT 308 [id number]   Basic Metabolic Panel: Recent Labs  Lab 08/07/20 0544  NA 141  K 4.4  CL 105  CO2 23  GLUCOSE 105*  BUN 13  CREATININE 0.81  CALCIUM 9.3  MG 2.0   GFR: Estimated Creatinine Clearance: 94.5 mL/min (by C-G formula based on SCr of 0.81 mg/dL). Liver Function Tests: Recent Labs  Lab 08/07/20 0544  AST 24  ALT  19  ALKPHOS 114  BILITOT 0.6  PROT 7.6  ALBUMIN  3.8   No results for input(s): LIPASE, AMYLASE in the last 168 hours. No results for input(s): AMMONIA in the last 168 hours. Coagulation Profile: Recent Labs  Lab 08/07/20 0926  INR 1.1   Cardiac Enzymes: No results for input(s): CKTOTAL, CKMB, CKMBINDEX, TROPONINI in the last 168 hours. BNP (last 3 results) No results for input(s): PROBNP in the last 8760 hours. HbA1C: No results for input(s): HGBA1C in the last 72 hours. CBG: Recent Labs  Lab 08/07/20 1212 08/07/20 1557 08/07/20 2107  GLUCAP 125* 96 133*   Lipid Profile: No results for input(s): CHOL, HDL, LDLCALC, TRIG, CHOLHDL, LDLDIRECT in the last 72 hours. Thyroid Function Tests: Recent Labs    08/07/20 0926  TSH 3.816   Anemia Panel: No results for input(s): VITAMINB12, FOLATE, FERRITIN, TIBC, IRON, RETICCTPCT in the last 72 hours. Urine analysis:    Component Value Date/Time   COLORURINE COLORLESS (A) 08/07/2020 0546   APPEARANCEUR CLEAR (A) 08/07/2020 0546   APPEARANCEUR Clear 05/22/2012 1136   LABSPEC 1.002 (L) 08/07/2020 0546   PHURINE 7.0 08/07/2020 0546   GLUCOSEU NEGATIVE 08/07/2020 0546   HGBUR NEGATIVE 08/07/2020 0546   BILIRUBINUR NEGATIVE 08/07/2020 0546   BILIRUBINUR Negative 05/22/2012 1136   KETONESUR NEGATIVE 08/07/2020 0546   PROTEINUR NEGATIVE 08/07/2020 0546   NITRITE NEGATIVE 08/07/2020 0546   LEUKOCYTESUR NEGATIVE 08/07/2020 0546   Sepsis Labs: @LABRCNTIP (procalcitonin:4,lacticidven:4) ) Recent Results (from the past 240 hour(s))  SARS Coronavirus 2 by RT PCR (hospital order, performed in [facility] hospital lab) Nasopharyngeal Nasopharyngeal Swab     Status: None   Collection Time: 08/07/20  6:18 AM   Specimen: Nasopharyngeal Swab  Result Value Ref Range Status   SARS Coronavirus 2 NEGATIVE NEGATIVE Final    Comment: (NOTE) SARS-CoV-2 target nucleic acids are NOT DETECTED.  The SARS-CoV-2 RNA is generally detectable in upper and lower respiratory specimens during the acute  phase of infection. The lowest concentration of SARS-CoV-2 viral copies this assay can detect is 250 copies / mL. A negative result does not preclude SARS-CoV-2 infection and should not be used as the sole basis for treatment or other patient management decisions.  A negative result may occur with improper specimen collection / handling, submission of specimen other than nasopharyngeal swab, presence of viral mutation(s) within the areas targeted by this assay, and inadequate number of viral copies (<250 copies / mL). A negative result must be combined with clinical observations, patient history, and epidemiological information.  Fact Sheet for Patients:   StrictlyIdeas.no  Fact Sheet for Healthcare Providers: BankingDealers.co.za  This test is not yet approved or  cleared by the [place] FDA and has been authorized for detection and/or diagnosis of SARS-CoV-2 by FDA under an Emergency Use Authorization (EUA).  This EUA will remain in effect (meaning this test can be used) for the duration of the COVID-19 declaration under Section 564(b)(1) of the Act, 21 U.S.C. section 360bbb-3(b)(1), unless the authorization is terminated or revoked sooner.  Performed at [facility], [address] [place], [place] [address]      Radiological Exams on Admission: DG Chest [place] 1 View  Result Date: 08/07/2020 CLINICAL DATA:  60 year old female who woke at 0400 hours with palpitations and shortness of breath. Positive for COVID-19. 07/14/2020. EXAM: PORTABLE CHEST 1 VIEW COMPARISON:  CTA chest 07/22/2020 and earlier. FINDINGS: Portable AP upright view at 0549 hours. Stable lung volumes and mediastinal contours, within  normal limits. Visualized tracheal air column is within normal limits. Allowing for portable technique the lungs are clear. No pneumothorax. No acute osseous abnormality identified. Paucity of bowel gas in the upper abdomen.  IMPRESSION: Negative portable chest. Electronically Signed   By: Genevie Ann M.D.   On: 08/07/2020 06:02   US BREAST LTD UNI RIGHT INC AXILLA  Result Date: 08/06/2020 CLINICAL DATA:  60 year old female with history of right breast cancer in 2009 status post lumpectomy. Patient presents for evaluation of a right axillary mass identified on recent CT scan. EXAM: ULTRASOUND OF THE RIGHT BREAST COMPARISON:  Previous exam(s). FINDINGS: On physical exam, I feel a firm mass in the low right axilla. Targeted ultrasound is performed in the right axilla demonstrating an irregular hypoechoic mass measuring 2.8 x 1.6 x 1.8 cm. Targeted ultrasound of regional axillary lymph nodes demonstrates no additional abnormal lymph node. IMPRESSION: Suspicious irregular mass in the right axilla measuring 2.8 cm, possibly an abnormal lymph node versus primary malignancy. No additional abnormal lymph nodes are identified sonographically in the right axilla though there do appear to be additional smaller adjacent suspicious masses on the patient's CT scan. RECOMMENDATION: Ultrasound-guided core needle biopsy of the right axillary mass. I have discussed the findings and recommendations with the patient who agrees to proceed with biopsy. The patient will be contacted by our scheduler to make the biopsy appointment BI-RADS CATEGORY  4: Suspicious. Electronically Signed   By: Audie Pinto M.D.   On: 08/06/2020 13:44     EKG: I have personally reviewed.  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 Since pt is scheduled for breast biopsy on 1/31, I will defer anticoagulants now.  Will follow up cardiologist recommendation.  Dr. Clayborn Bigness of cardiology is consulted.  Troponin level 4.  -Placed on progressive benefit observation -Continue Cardizem drip -2D echo -Check TSH  HTN:  -Hold amlodipine and lisinopril since patient is on Cardizem drip  Personal history of breast cancer -Patient is scheduled for breast biopsy on 1/31  GERD (gastroesophageal reflux disease) -Protonix  Diabetes mellitus without complication ([place]): Recent A1c 6.0, well controlled.  Patient is taking Metformin at home -Sliding scale insulin  OSA  -on CPAP  Cirrhosis of liver not due to alcohol [facility]): Mental status normal -Check INR/PTT  Covid 19 infection: Patient states that she had positive Covid test on 1/3.  No oxygen desaturation.  Chest x-ray negative.  No treatment needed. -Vitamin C and zinc sulfate  Depression: -Continue home medications: Wellbutrin, Celexa       DVT ppx:   SQ Lovenox Code Status: Full code Family Communication:   Yes, patient's husband by phone Disposition Plan:  Anticipate discharge back to previous environment Consults called:  Dr. Clayborn Bigness of card Admission status and Level of care: Progressive Cardiac progressive unit for obs        Status is: Observation  The patient remains OBS appropriate and will d/c before 2 midnights.  Dispo: The patient is from: Home  Anticipated d/c is to: Home              Anticipated d/c date is: 1 day              Patient currently is not medically stable to d/c.   Difficult to place patient No           Date of Service 08/08/2020    [facility]   If 7PM-7AM, please contact night-coverage [email or web address] 08/08/2020, 7:01 AM

## 2020-08-07 NOTE — ED Notes (Signed)
Pt helped to bathroom, gait steady. Dietary called to bring pt breakfast.

## 2020-08-07 NOTE — ED Notes (Signed)
Pt noted by this RN to be in NSR. Dr. Blaine Hamper notified, Dr Clayborn Bigness paged. Will hold amiodarone and lopressor until communication from cardiology.

## 2020-08-07 NOTE — Consult Note (Signed)
CARDIOLOGY CONSULT NOTE               Patient ID: Jacqueline Deleon MRN: [id number] DOB/AGE: 1960-08-02 60 y.o.  Admit date: 08/07/2020 Referring Physician Ivor Costa MD hospitalist Primary Physician Dr. Loma Sousa Primary Cardiologist [facility] Reason for Consultation atrial fibrillation palpitations  HPI: Patient is a 60 year old female presents with palpitations heart racing found to be in atrial fibrillation heart rate of 140 she has a history of multiple medical problems which makes her CHADS2 score significant hypertension hyperlipidemia obstructive sleep apnea diabetes [place] breast cancer IBS anxiety. With the worsening palpitations she decided come to emergency room denies any significant shortness of breath or dyspnea or chest pain EKG was significantly elevated in terms of rate with narrow complex atrial fibrillation around 140 she was placed on Cardizem with minimal improvement but is off blood pressure. Denies any previous history of atrial fibrillation  Review of systems complete and found to be negative unless listed above     Past Medical History:  Diagnosis Date  . Allergic genetic state   . Arthritis   . 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 Breast mass 08/2006, 03/2009   left breast biopsy fibroadenoma (2008) right breast biopsy benign (2010)  . 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 Central serous retinopathy   . Chicken pox   . Depression   . Fatty liver   . Fatty liver   . Gastric polyps   . GERD (gastroesophageal reflux disease)   . Gestational diabetes   . Headache    migraines  . Heart murmur   . History of abnormal mammogram 08/2006, 01/2008, 03/2009  . Hypertension   . IBS (irritable bowel syndrome)   . Joint disorder    hx of left ankle tendonitis, bursitis, heel spur  . Obesity 2018   BMI 45  . Pelvic pain    adhesions and adenomyosis  . Personal history of radiation therapy   . Rosacea      Past Surgical History:  Procedure Laterality Date  . BREAST BIOPSY Left 08/2006   benign fibroadenoma  . BREAST EXCISIONAL BIOPSY Right 02/26/2008   right breast invasive mam ca with rad partial mastectomy   . BREAST SURGERY Right    x2/ Dr Tamala Julian  . CARDIAC CATHETERIZATION  2006  . CESAREAN SECTION  04/02/1998  . CHOLECYSTECTOMY  04/2010   Dr. Smith/ laprascopic surgery  . COLONOSCOPY  04/04/2000  . COLONOSCOPY WITH PROPOFOL N/A 02/12/2019   Procedure: COLONOSCOPY WITH PROPOFOL;  Surgeon: Lollie Sails, MD;  Location: [facility] ENDOSCOPY;  Service: Endoscopy;  Laterality: N/A;  . ESOPHAGOGASTRODUODENOSCOPY (EGD) WITH PROPOFOL N/A 05/01/2015   Procedure: ESOPHAGOGASTRODUODENOSCOPY (EGD) WITH PROPOFOL;  Surgeon: Hulen Luster, MD;  Location: [facility] ENDOSCOPY;  Service: Gastroenterology;  Laterality: N/A;  . ESOPHAGOGASTRODUODENOSCOPY (EGD) WITH PROPOFOL N/A 02/12/2019   Procedure: ESOPHAGOGASTRODUODENOSCOPY (EGD) WITH PROPOFOL;  Surgeon: Lollie Sails, MD;  Location: [facility] ENDOSCOPY;  Service: Endoscopy;  Laterality: N/A;  . EYE SURGERY  08/2012   laser surgery on retina/ DR Appenzeller  . FINGER SURGERY     repair of tendon in right index, Dr. Francesco Sor in [place]  . FOOT SURGERY     Dr. Milinda Pointer  . IRRIGATION AND DEBRIDEMENT SEBACEOUS CYST     right upper back  . LAPAROSCOPIC SUPRACERVICAL HYSTERECTOMY  06/2007   Dr Kincius/ adhesions/CPP/adenomyosis  . MASTECTOMY PARTIAL / LUMPECTOMY Right 01/2008  with sentinal lymph node biopsy/ infiltrating ductal cancer  . REPAIR PERONEAL TENDONS ANKLE  10/2019   with tarsal exostectomy and sural neuroma excision on right   . SKIN CANCER EXCISION     back and calves  . WISDOM TOOTH EXTRACTION  1991    Medications Prior to Admission  Medication Sig Dispense Refill Last Dose  . acetaminophen (TYLENOL) 325 MG tablet Take 650 mg by mouth every 6 (six) hours as needed for mild pain or headache.   Past Month at Unknown time  . amLODipine  (NORVASC) 10 MG tablet Take 0.5 tablets (5 mg total) by mouth daily. 90 tablet 3 08/06/2020 at Unknown time  . buPROPion (WELLBUTRIN XL) 300 MG 24 hr tablet Take 300 mg by mouth at bedtime.   08/06/2020 at Unknown time  . citalopram (CELEXA) 40 MG tablet Take 40 mg by mouth at bedtime.   08/06/2020 at Unknown time  . clotrimazole (LOTRIMIN) 1 % cream Apply 1 application topically daily. groin   Past Week at Unknown time  . diphenhydrAMINE (BENADRYL) 25 MG tablet Take 25 mg by mouth at bedtime as needed for sleep.   Past Week at Unknown time  . ibuprofen (ADVIL,MOTRIN) 200 MG tablet Take 400 mg by mouth every 6 (six) hours as needed for headache or mild pain.   Past Month at Unknown time  . ketotifen (ZADITOR) 0.025 % ophthalmic solution Place 1 drop into both eyes daily.   08/06/2020 at Unknown time  . lisinopril (PRINIVIL,ZESTRIL) 10 MG tablet Take 10 mg by mouth at bedtime.   08/06/2020 at Unknown time  . metFORMIN (GLUCOPHAGE-XR) 500 MG 24 hr tablet Take 500 mg by mouth in the morning and at bedtime.   08/06/2020 at Unknown time  . Multiple Vitamin (MULTIVITAMIN) tablet Take 1 tablet by mouth daily.   08/06/2020 at Unknown time  . omeprazole (PRILOSEC) 20 MG capsule Take 20 mg by mouth daily.   08/06/2020 at Unknown time   Social History   Socioeconomic History  . Marital status: Married    Spouse name: Richardson Landry  . Number of children: 1  . Years of education: 42  . Highest education level: Not on file  Occupational History  . Occupation: CMA  Tobacco Use  . Smoking status: Never Smoker  . Smokeless tobacco: Never Used  Vaping Use  . Vaping Use: Never used  Substance and Sexual Activity  . Alcohol use: Yes    Alcohol/week: 0.0 standard drinks    Comment: rare, 1-2 drinks per year  . Drug use: No  . Sexual activity: Yes    Partners: Male    Birth control/protection: Surgical    Comment: Hysterectomy   Other Topics Concern  . Not on file  Social History Narrative   Lives in [place] with  husband, no pets.  Works at [employer].      Diet - regular   Exercise - occasional   Social Determinants of Health   Financial Resource Strain: Not on file  Food Insecurity: Not on file  Transportation Needs: Not on file  Physical Activity: Not on file  Stress: Not on file  Social Connections: Not on file  Intimate Partner Violence: Not on file    Family History  Problem Relation Age of Onset  . Hypertension Mother   . Thyroid disease Mother   . Breast cancer Mother 65  . Colon cancer Mother 8       again at 51  . Atrial fibrillation Mother   .  Hip fracture Mother   . Stroke Father   . Hypertension Father   . Cancer Father 27       prostate  . Parkinson's disease Father   . Hypertension Sister   . Thyroid disease Sister   . Cancer Sister        skin/ squamous cell  . Lung cancer Paternal Aunt 80       smoker  . Diabetes Maternal Grandmother   . Stroke Maternal Grandfather   . Heart disease Paternal Grandfather   . Diabetes Maternal Aunt   . Lymphoma Maternal Uncle   . Thyroid cancer Cousin        maternal  . Heart disease Maternal Uncle   . Cancer Maternal Aunt   . Breast cancer Maternal Aunt 75  . Breast cancer Cousin 60       maternal      Review of systems complete and found to be negative unless listed above      PHYSICAL EXAM  General: Well developed, well nourished, in no acute distress HEENT:  Normocephalic and atramatic Neck:  No JVD.  Lungs: Clear bilaterally to auscultation and percussion. Heart: Irregular. Irregular S1 and S2 without gallops or murmurs.  Abdomen: Bowel sounds are positive, abdomen soft and non-tender  Msk:  Back normal, normal gait. Normal strength and tone for age. Extremities: No clubbing, cyanosis or edema.   Neuro: Alert and oriented X 3. Psych:  Good affect, responds appropriately  Labs:   Lab Results  Component Value Date   WBC 6.7 08/07/2020   HGB 12.6 08/07/2020   HCT 40.1 08/07/2020   MCV 77.4 (L)  08/07/2020   PLT 308 08/07/2020    Recent Labs  Lab 08/07/20 0544  NA 141  K 4.4  CL 105  CO2 23  BUN 13  CREATININE 0.81  CALCIUM 9.3  PROT 7.6  BILITOT 0.6  ALKPHOS 114  ALT 19  AST 24  GLUCOSE 105*   No results found for: CKTOTAL, CKMB, CKMBINDEX, TROPONINI  Lab Results  Component Value Date   CHOL 154 03/06/2020   CHOL 160 08/24/2019   CHOL 175 01/16/2019   Lab Results  Component Value Date   HDL 59 03/06/2020   HDL 58 08/24/2019   HDL 53 01/16/2019   Lab Results  Component Value Date   LDLCALC 76 03/06/2020   LDLCALC 82 08/24/2019   LDLCALC 96 01/16/2019   Lab Results  Component Value Date   TRIG 103 03/06/2020   TRIG 113 08/24/2019   TRIG 132 01/16/2019   Lab Results  Component Value Date   CHOLHDL 2.8 08/24/2019   CHOLHDL 3.3 01/16/2019   CHOLHDL 2.9 05/02/2018   No results found for: LDLDIRECT    Radiology: DG Chest 1 View  Result Date: 07/22/2020 CLINICAL DATA:  Chest pain and shortness of breath EXAM: CHEST  1 VIEW COMPARISON:  Chest CT June 16, 2009 FINDINGS: Lungs are clear. Heart is upper normal in size with pulmonary vascularity normal. No adenopathy. No pneumothorax. No bone lesions. IMPRESSION: Lungs clear.  Heart upper normal in size. Electronically Signed   By: Lowella Grip III M.D.   On: 07/22/2020 15:19   CT Angio Chest PE W and/or Wo Contrast  Result Date: 07/22/2020 CLINICAL DATA:  PE suspected, high prob Cough and shortness of breath since COVID diagnosis 8 days ago EXAM: CT ANGIOGRAPHY CHEST WITH CONTRAST TECHNIQUE: Multidetector CT imaging of the chest was performed using the standard protocol during bolus administration  of intravenous contrast. Multiplanar CT image reconstructions and MIPs were obtained to evaluate the vascular anatomy. CONTRAST:  110mL OMNIPAQUE IOHEXOL 350 MG/ML SOLN COMPARISON:  Radiograph earlier today. FINDINGS: Cardiovascular: There are no filling defects within the pulmonary arteries to suggest  pulmonary embolus. Normal caliber thoracic aorta. No dissection or acute aortic findings. Upper normal heart size. No pericardial effusion. Mediastinum/Nodes: There is an abnormal right lobulated lymph node/axillary mass measuring 2.5 cm, series 4, image 24. Prominent adjacent axillary lymph nodes. There is mild adjacent tissue edema. No obvious breast mass. No mediastinal or hilar adenopathy. No visualized thyroid nodule. No esophageal wall thickening, tiny hiatal hernia. Lungs/Pleura: 2 smoothly marginated perifissural nodules adjacent to the right minor and major fissure are most consistent with intrapulmonary lymph nodes. No focal airspace disease or ground-glass opacity typically seen with COVID pneumonia. No findings of pulmonary edema. No pleural fluid. Upper Abdomen: Prominent liver with steatosis partially included. No acute upper abdominal findings. Musculoskeletal: Thoracic spondylosis. There are no acute or suspicious osseous abnormalities. Review of the MIP images confirms the above findings. IMPRESSION: 1. No pulmonary embolus. 2. No evidence of pneumonia. 3. Abnormal right axillary lymph node/axillary mass measuring 2.5 cm with mild adjacent edema. Prominent adjacent axillary lymph nodes. Patient has history of right breast cancer. A right axillary ultrasound in September 2021 demonstrated no focal abnormality. Recommend repeat targeted axillary ultrasound at the breast center. 4. Incidental note of hepatic steatosis. Electronically Signed   By: Keith Rake M.D.   On: 07/22/2020 17:53   DG Chest Port 1 View  Result Date: 08/07/2020 CLINICAL DATA:  60 year old female who woke at 0400 hours with palpitations and shortness of breath. Positive for COVID-19. 07/14/2020. EXAM: PORTABLE CHEST 1 VIEW COMPARISON:  CTA chest 07/22/2020 and earlier. FINDINGS: Portable AP upright view at 0549 hours. Stable lung volumes and mediastinal contours, within normal limits. Visualized tracheal air column is  within normal limits. Allowing for portable technique the lungs are clear. No pneumothorax. No acute osseous abnormality identified. Paucity of bowel gas in the upper abdomen. IMPRESSION: Negative portable chest. Electronically Signed   By: Genevie Ann M.D.   On: 08/07/2020 06:02   US BREAST LTD UNI RIGHT INC AXILLA  Result Date: 08/06/2020 CLINICAL DATA:  60 year old female with history of right breast cancer in 2009 status post lumpectomy. Patient presents for evaluation of a right axillary mass identified on recent CT scan. EXAM: ULTRASOUND OF THE RIGHT BREAST COMPARISON:  Previous exam(s). FINDINGS: On physical exam, I feel a firm mass in the low right axilla. Targeted ultrasound is performed in the right axilla demonstrating an irregular hypoechoic mass measuring 2.8 x 1.6 x 1.8 cm. Targeted ultrasound of regional axillary lymph nodes demonstrates no additional abnormal lymph node. IMPRESSION: Suspicious irregular mass in the right axilla measuring 2.8 cm, possibly an abnormal lymph node versus primary malignancy. No additional abnormal lymph nodes are identified sonographically in the right axilla though there do appear to be additional smaller adjacent suspicious masses on the patient's CT scan. RECOMMENDATION: Ultrasound-guided core needle biopsy of the right axillary mass. I have discussed the findings and recommendations with the patient who agrees to proceed with biopsy. The patient will be contacted by our scheduler to make the biopsy appointment BI-RADS CATEGORY  4: Suspicious. Electronically Signed   By: Audie Pinto M.D.   On: 08/06/2020 13:44    EKG: AFIB RVR 140   ASSESSMENT AND PLAN:  AFIB RVR  Palpitation HTN OSA DM Obesity NASH GERD Hyperlipidemia Recent  COVID . Plan  Anticoug for AFIB lovenoxor Heparin Continue tele /Ekgs /Troponins Agree with cardizem for rate control Continue CPAP for OSA recommend weight lost Consider amiodarone for rhythm cardizem/metoprolol   Recommend modest weight loss exercise portion control Continue diabetes management and control Recommend statin therapy for lipid management We will make determination for long-term anticoagulation depending on how the patient does and recurrence of A. fib patient has a significant CHADS2 score but I am concerned that some of this may be post Covid and may not be a long-term problem Recommend cardiac work-up echo and probably a functional study but these may be able to be done as an outpatient    Signed: Yolonda Kida MD 08/07/2020, 4:57 PM

## 2020-08-07 NOTE — Plan of Care (Signed)

## 2020-08-07 NOTE — ED Notes (Signed)
bg 125

## 2020-08-07 NOTE — ED Notes (Signed)
Dr Blaine Hamper updated on pt's HR and increase in cardizem drip per titration orders

## 2020-08-07 NOTE — ED Provider Notes (Signed)
[facility] Emergency Department Provider Note ____________________________________________   Event Date/Time   First MD Initiated Contact with Patient 08/07/20 [phone]     (approximate)  I have reviewed the triage vital signs and the nursing notes.  HISTORY  Chief Complaint No chief complaint on file.   HPI Jacqueline Deleon is a 60 y.o. femalewho presents to the ED for evaluation of palpitations and shortness of breath.  Chart review indicates history of HTN on amlodipine, DM on oral agents and HLD.Marland Kitchen  ED visit on 1/11 with negative CTA chest. Positive Covid test on 1/3 Patient self-reports a stress test in the past which was negative.  No known history of CAD.  Patient reports being in her typical state of health until developing sudden onset palpitations that awoke her from sleep this morning at about 4 AM.  She reports associated mild chest discomfort, but minimizes this, indicating that she largely just feels palpitations.  No emesis, syncope, cough, fever, abdominal pain, falls or trauma.  No history of A. fib, but does report her mother has it.  2009 history of breast cancer.  Recent discovery of right-sided axillary lymphadenopathy and outpatient ultrasound of the breast and axilla performed yesterday demonstrates a 2.8 cm suspicious irregular nodule.  Patient has a biopsy of this nodule scheduled for Monday 1/31.  Past Medical History:  Diagnosis Date  . Allergic genetic state   . Arthritis   . 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 Breast mass 08/2006, 03/2009   left breast biopsy fibroadenoma (2008) right breast biopsy benign (2010)  . 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 Central serous retinopathy   . Chicken pox   . Depression   . Fatty liver   . Fatty liver   . Gastric polyps   . GERD (gastroesophageal reflux disease)   . Gestational diabetes   . Headache    migraines  . Heart  murmur   . History of abnormal mammogram 08/2006, 01/2008, 03/2009  . Hypertension   . IBS (irritable bowel syndrome)   . Joint disorder    hx of left ankle tendonitis, bursitis, heel spur  . Obesity 2018   BMI 45  . Pelvic pain    adhesions and adenomyosis  . Personal history of radiation therapy   . Rosacea     Patient Active Problem List   Diagnosis Date Noted  . 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 OSA on CPAP 05/15/2018  . Non-alcoholic fatty liver disease 03/06/2017  . Rosacea   . GERD (gastroesophageal reflux disease)   . Central serous retinopathy   . IBS (irritable bowel syndrome)   . Hyperlipidemia, mixed 01/24/2017  . Dyspnea on effort 12/22/2016  . Anxiety, generalized 04/29/2016  . Diabetes mellitus without complication ([place]) 99991111  . Cancer of the skin, basal cell 03/12/2016  . Contact dermatitis 04/23/2013  . Nodule, subcutaneous 07/24/2012  . Localized swelling, mass and lump, unspecified 07/24/2012  . Hypertension 12/20/2011  . Personal history of breast cancer 12/20/2011  . Obesity 12/20/2011    Past Surgical History:  Procedure Laterality Date  . BREAST BIOPSY Left 08/2006   benign fibroadenoma  . BREAST EXCISIONAL BIOPSY Right 02/26/2008   right breast invasive mam ca with rad partial mastectomy   . BREAST SURGERY Right    x2/ Dr Tamala Julian  . CARDIAC CATHETERIZATION  2006  . CESAREAN  SECTION  04/02/1998  . CHOLECYSTECTOMY  04/2010   Dr. Duayne Brideau/ laprascopic surgery  . COLONOSCOPY  04/04/2000  . COLONOSCOPY WITH PROPOFOL N/A 02/12/2019   Procedure: COLONOSCOPY WITH PROPOFOL;  Surgeon: Lollie Sails, MD;  Location: [facility] ENDOSCOPY;  Service: Endoscopy;  Laterality: N/A;  . ESOPHAGOGASTRODUODENOSCOPY (EGD) WITH PROPOFOL N/A 05/01/2015   Procedure: ESOPHAGOGASTRODUODENOSCOPY (EGD) WITH PROPOFOL;  Surgeon: Hulen Luster, MD;  Location[facility]COPY;  Service: Gastroenterology;  Laterality: N/A;  . ESOPHAGOGASTRODUODENOSCOPY (EGD)  WITH PROPOFOL N/A 02/12/2019   Procedure: ESOPHAGOGASTRODUODENOSCOPY (EGD) WITH PROPOFOL;  Surgeon: Lollie Sails, MD;  Location: [facility] ENDOSCOPY;  Service: Endoscopy;  Laterality: N/A;  . EYE SURGERY  08/2012   laser surgery on retina/ DR Appenzeller  . FINGER SURGERY     repair of tendon in right index, Dr. Francesco Sor in [place]  . FOOT SURGERY     Dr. Milinda Pointer  . IRRIGATION AND DEBRIDEMENT SEBACEOUS CYST     right upper back  . LAPAROSCOPIC SUPRACERVICAL HYSTERECTOMY  06/2007   Dr Kincius/ adhesions/CPP/adenomyosis  . MASTECTOMY PARTIAL / LUMPECTOMY Right 01/2008   with sentinal lymph node biopsy/ infiltrating ductal cancer  . REPAIR PERONEAL TENDONS ANKLE  10/2019   with tarsal exostectomy and sural neuroma excision on right   . SKIN CANCER EXCISION     back and calves  . [place]    Prior to Admission medications   Medication Sig Start Date End Date Taking? Authorizing Provider  acetaminophen (TYLENOL) 325 MG tablet Take 650 mg by mouth every 6 (six) hours as needed.    [provider]  amLODipine (NORVASC) 10 MG tablet Take 0.5 tablets (5 mg total) by mouth daily. 04/23/13   Jackolyn Confer, MD  buPROPion (WELLBUTRIN XL) 300 MG 24 hr tablet Take by mouth. 01/18/19 01/18/20  [provider]  Camphor-Eucalyptus-Menthol (VICKS VAPORUB) 4.7-1.2-2.6 % OINT     [provider]  citalopram (CELEXA) 10 MG tablet Take by mouth. 01/18/19 01/18/20  [provider]  citalopram (CELEXA) 20 MG tablet Take 20 mg by mouth daily.    [provider]  clotrimazole (CLOTRIMAZOLE ANTI-FUNGAL) 1 % cream Apply topically.    [provider]  diphenhydrAMINE (BENADRYL) 25 MG tablet Take by mouth.    [provider]  ibuprofen (ADVIL) 800 MG tablet TAKE 1 TABLET BY MOUTH EVERY 6 HOURS AS NEEDED. 11/20/19   Felipa Furnace, DPM  ibuprofen (ADVIL,MOTRIN) 200 MG tablet Take 200 mg by mouth every 6 (six) hours as needed.     [provider]  ketotifen (ZADITOR) 0.025 % ophthalmic solution Apply to eye.    [provider]  lisinopril (PRINIVIL,ZESTRIL) 10 MG tablet Take by mouth. 03/01/17   [provider]  metFORMIN (GLUCOPHAGE-XR) 500 MG 24 hr tablet  09/15/15   [provider]  metroNIDAZOLE (METROGEL) 0.75 % gel  06/24/15   [provider]  Multiple Vitamin (MULTIVITAMIN) tablet Take 1 tablet by mouth daily.    [provider]  omeprazole (PRILOSEC) 20 MG capsule Take 20 mg by mouth daily.    [provider]  PFIZER-BIONTECH COVID-19 VACC 30 MCG/0.3ML injection  05/23/20   [provider]    Allergies Kiwi extract, Other, Codeine, Amoxicillin, Erythromycin, Sulfa antibiotics, and Tape  Family History  Problem Relation Age of Onset  . Hypertension Mother   . Thyroid disease Mother   . Breast cancer Mother 33  . Colon cancer Mother 48  again at 72  . Atrial fibrillation Mother   . Hip fracture Mother   . Stroke Father   . Hypertension Father   . Cancer Father 35       prostate  . Parkinson's disease Father   . Hypertension Sister   . Thyroid disease Sister   . Cancer Sister        skin/ squamous cell  . Lung cancer Paternal Aunt 80       smoker  . Diabetes Maternal Grandmother   . Stroke Maternal Grandfather   . Heart disease Paternal Grandfather   . Diabetes Maternal Aunt   . Lymphoma Maternal Uncle   . Thyroid cancer Cousin        maternal  . Heart disease Maternal Uncle   . Cancer Maternal Aunt   . Breast cancer Maternal Aunt 75  . Breast cancer Cousin 9       maternal    Social History Social History   Tobacco Use  . Smoking status: Never Smoker  . Smokeless tobacco: Never Used  Vaping Use  . Vaping Use: Never used  Substance Use Topics  . Alcohol use: Yes    Alcohol/week: 0.0 standard drinks    Comment: rare, 1-2 drinks per year  . Drug use: No    Review of Systems  Constitutional: No  fever/chills Eyes: No visual changes. ENT: No sore throat. Cardiovascular: Positive for palpitations and discomfort in the chest. Respiratory: Denies shortness of breath. Gastrointestinal: No abdominal pain.  No nausea, no vomiting.  No diarrhea.  No constipation. Genitourinary: Negative for dysuria. Musculoskeletal: Negative for back pain. Skin: Negative for rash. Neurological: Negative for headaches, focal weakness or numbness.  ____________________________________________   PHYSICAL EXAM:  VITAL SIGNS: Vitals:   08/07/20 0539  BP: 121/76  Pulse: (!) 148  Resp: 20  Temp: 99.2 F (37.3 C)  SpO2: 99%     Constitutional: Alert and oriented. Well appearing and in no acute distress., Conversational in full sentences.  Obese.  Sitting up in the side of the bed Eyes: Conjunctivae are normal. PERRL. EOMI. Head: Atraumatic. Nose: No congestion/rhinnorhea. Mouth/Throat: Mucous membranes are moist.  Oropharynx non-erythematous. Neck: No stridor. No cervical spine tenderness to palpation. Cardiovascular: Tachycardic rate, regular rhythm. Grossly normal heart sounds.  Good peripheral circulation. Respiratory: Normal respiratory effort.  No retractions. Lungs CTAB. Gastrointestinal: Soft , nondistended, nontender to palpation. No CVA tenderness. Musculoskeletal: No lower extremity tenderness nor edema.  No joint effusions. No signs of acute trauma. Neurologic:  Normal speech and language. No gross focal neurologic deficits are appreciated. No gait instability noted. Skin:  Skin is warm, dry and intact. No rash noted. Psychiatric: Mood and affect are normal. Speech and behavior are normal.  ____________________________________________   LABS (all labs ordered are listed, but only abnormal results are displayed)  Labs Reviewed  CBC WITH DIFFERENTIAL/PLATELET  COMPREHENSIVE METABOLIC PANEL  TROPONIN I (HIGH SENSITIVITY)   ____________________________________________  12 Lead  EKG  Atrial flutter rate of 146 bpm.  Normal axis and intervals.  No evidence of acute ischemia. ____________________________________________  RADIOLOGY  ED MD interpretation:  Portable 1 view CXR reviewed by me without evidence of acute cardiopulmonary pathology.  Official radiology report(s): US BREAST LTD UNI RIGHT INC AXILLA  Result Date: 08/06/2020 CLINICAL DATA:  60 year old female with history of right breast cancer in 2009 status post lumpectomy. Patient presents for evaluation of a right axillary mass identified on recent CT scan. EXAM: ULTRASOUND OF THE RIGHT BREAST COMPARISON:  Previous exam(s). FINDINGS: On physical exam, I feel a firm mass in the low right axilla. Targeted ultrasound is performed in the right axilla demonstrating an irregular hypoechoic mass measuring 2.8 x 1.6 x 1.8 cm. Targeted ultrasound of regional axillary lymph nodes demonstrates no additional abnormal lymph node. IMPRESSION: Suspicious irregular mass in the right axilla measuring 2.8 cm, possibly an abnormal lymph node versus primary malignancy. No additional abnormal lymph nodes are identified sonographically in the right axilla though there do appear to be additional smaller adjacent suspicious masses on the patient's CT scan. RECOMMENDATION: Ultrasound-guided core needle biopsy of the right axillary mass. I have discussed the findings and recommendations with the patient who agrees to proceed with biopsy. The patient will be contacted by our scheduler to make the biopsy appointment BI-RADS CATEGORY  4: Suspicious. Electronically Signed   By: Audie Pinto M.D.   On: 08/06/2020 13:44    ____________________________________________   PROCEDURES and INTERVENTIONS  Procedure(s) performed (including Critical Care):  .1-3 Lead EKG Interpretation Performed by: Vladimir Crofts, MD Authorized by: Vladimir Crofts, MD     Interpretation: abnormal     ECG rate:  142   ECG rate assessment: tachycardic     Rhythm:  atrial flutter     Ectopy: none     Conduction: normal   .Critical Care Performed by: Vladimir Crofts, MD Authorized by: Vladimir Crofts, MD   Critical care provider statement:    Critical care time (minutes):  45   Critical care was necessary to treat or prevent imminent or life-threatening deterioration of the following conditions:  Cardiac failure   Critical care was time spent personally by me on the following activities:  Discussions with consultants, evaluation of patient's response to treatment, examination of patient, ordering and performing treatments and interventions, ordering and review of laboratory studies, ordering and review of radiographic studies, pulse oximetry, re-evaluation of patient's condition, obtaining history from patient or surrogate and review of old charts    Medications - No data to display  ____________________________________________   MDM / ED COURSE   60 year old woman presents to the ED with palpitations, found to have new onset atrial flutter, requiring diltiazem drip and medical admission.  Heart rate consistently at about 142, but hemodynamically stable.  EKG demonstrates atrial flutter without ischemic changes.  She looks well clinically without distress, neurovascular deficits or signs of trauma.  Blood work is reassuring with normal electrolytes and no troponin leak.  CXR without infiltrates.  We will start a diltiazem bolus and drip for rate control and discussed the case with hospitalist medicine for admission.   Clinical Course as of 08/07/20 [id number]  Thu Aug 07, 2020  0619 Reassessed. Cardizem being started now. Husband now at the bedside and provides additional history. Patient has an upcoming biopsy of a right axillary lymph node coming up this Monday. We discussed likely recommendations for anticoagulation for stroke prevention. We discussed holding off on this for a few days so she can still have a biopsy performed as scheduled. Answered questions.  [DS]  [id number] Maintaining normotensive pressures after 10 mg of IV diltiazem, we will bolus an additional 10 as we start the drip [DS]    Clinical Course User Index [DS] Vladimir Crofts, MD    ____________________________________________   FINAL CLINICAL IMPRESSION(S) / ED DIAGNOSES  Final diagnoses:  None     ED Discharge Orders    None       Kerryann Allaire   Note:  This document was prepared using  [employer] training and education [employer] and may include unintentional dictation errors.   Vladimir Crofts, MD 08/07/20 [phone]

## 2020-08-07 NOTE — ED Notes (Signed)
Called floor to activate purple man °

## 2020-08-07 NOTE — ED Triage Notes (Addendum)
EMS brought pt in from home; pt to triage via w/c with no distress noted; st awoke at 4am with "fluttering sensation" and SHOB; st pain to "back of neck"; +COVID on 1/3

## 2020-08-07 NOTE — ED Notes (Signed)
Lab called to draw blood

## 2020-08-07 NOTE — ED Notes (Signed)
Per Dr. Blaine Hamper, keep cardizem at current rate

## 2020-08-08 ENCOUNTER — Other Ambulatory Visit: Payer: Self-pay | Admitting: Internal Medicine

## 2020-08-08 ENCOUNTER — Observation Stay
Admit: 2020-08-08 | Discharge: 2020-08-08 | Disposition: A | Discharge status: Home / Self Care | Payer: 59 | Attending: Internal Medicine | Admitting: Internal Medicine

## 2020-08-08 DIAGNOSIS — Z20822 Contact with and (suspected) exposure to covid-19: Secondary | ICD-10-CM | POA: Diagnosis not present

## 2020-08-08 DIAGNOSIS — Z7984 Long term (current) use of oral hypoglycemic drugs: Secondary | ICD-10-CM | POA: Diagnosis not present

## 2020-08-08 DIAGNOSIS — Z8616 Personal history of COVID-19: Secondary | ICD-10-CM | POA: Diagnosis not present

## 2020-08-08 DIAGNOSIS — U071 COVID-19: Secondary | ICD-10-CM | POA: Diagnosis not present

## 2020-08-08 DIAGNOSIS — Z85828 Personal history of other malignant neoplasm of skin: Secondary | ICD-10-CM | POA: Diagnosis not present

## 2020-08-08 DIAGNOSIS — Z853 Personal history of malignant neoplasm of breast: Secondary | ICD-10-CM | POA: Diagnosis not present

## 2020-08-08 DIAGNOSIS — E119 Type 2 diabetes mellitus without complications: Secondary | ICD-10-CM | POA: Diagnosis not present

## 2020-08-08 DIAGNOSIS — I483 Typical atrial flutter: Secondary | ICD-10-CM | POA: Diagnosis not present

## 2020-08-08 DIAGNOSIS — K219 Gastro-esophageal reflux disease without esophagitis: Secondary | ICD-10-CM | POA: Diagnosis not present

## 2020-08-08 DIAGNOSIS — I10 Essential (primary) hypertension: Secondary | ICD-10-CM | POA: Diagnosis not present

## 2020-08-08 DIAGNOSIS — I4891 Unspecified atrial fibrillation: Secondary | ICD-10-CM | POA: Diagnosis not present

## 2020-08-08 DIAGNOSIS — Z79899 Other long term (current) drug therapy: Secondary | ICD-10-CM | POA: Diagnosis not present

## 2020-08-08 DIAGNOSIS — K7460 Unspecified cirrhosis of liver: Secondary | ICD-10-CM | POA: Diagnosis not present

## 2020-08-08 DIAGNOSIS — I4892 Unspecified atrial flutter: Secondary | ICD-10-CM | POA: Diagnosis not present

## 2020-08-08 LAB — BASIC METABOLIC PANEL
Anion gap: 10 (ref 5–15)
BUN: 19 mg/dL (ref 6–20)
CO2: 28 mmol/L (ref 22–32)
Calcium: 9 mg/dL (ref 8.9–10.3)
Chloride: 103 mmol/L (ref 98–111)
Creatinine, Ser: 0.9 mg/dL (ref 0.44–1.00)
GFR, Estimated: >60 mL/min (ref >60)
Glucose, Bld: 98 mg/dL (ref 70–99)
Potassium: 4.7 mmol/L (ref 3.5–5.1)
Sodium: 141 mmol/L (ref 135–145)

## 2020-08-08 LAB — ECHOCARDIOGRAM COMPLETE
AR max vel: 1.68 cm2
AV Area VTI: 2.09 cm2
AV Area mean vel: 1.86 cm2
AV Mean grad: 7 mmHg
AV Peak grad: 15.8 mmHg
Ao pk vel: 1.99 m/s
Area-P 1/2: 4.08 cm2
Height: 63 in
MV VTI: 2.71 cm2
S' Lateral: 3.4 cm
Weight: 4112.9 oz

## 2020-08-08 LAB — CBC
HCT: 37.6 % (ref 36.0–46.0)
Hemoglobin: 11.7 g/dL — ABNORMAL LOW (ref 12.0–15.0)
MCH: 24.5 pg — ABNORMAL LOW (ref 26.0–34.0)
MCHC: 31.1 g/dL (ref 30.0–36.0)
MCV: 78.7 fL — ABNORMAL LOW (ref 80.0–100.0)
Platelets: 292 10*3/uL (ref 150–400)
RBC: 4.78 MIL/uL (ref 3.87–5.11)
RDW: 18.6 % — ABNORMAL HIGH (ref 11.5–15.5)
WBC: 5.7 10*3/uL (ref 4.0–10.5)
nRBC: 0 % (ref 0.0–0.2)

## 2020-08-08 LAB — GLUCOSE, CAPILLARY
Glucose-Capillary: 107 mg/dL — ABNORMAL HIGH (ref 70–99)
Glucose-Capillary: 133 mg/dL — ABNORMAL HIGH (ref 70–99)

## 2020-08-08 MED ORDER — DILTIAZEM HCL ER COATED BEADS 120 MG PO CP24: 120.0000 mg | ORAL_CAPSULE | Freq: Every day | ORAL | 11 refills | Status: DC

## 2020-08-08 MED ORDER — DILTIAZEM HCL 30 MG PO TABS
30.0000 mg | ORAL_TABLET | Freq: Four times a day (QID) | ORAL | Status: DC
  Administered 2020-08-08 (×3): 30 mg via ORAL
  Filled 2020-08-08 (×3): qty 1

## 2020-08-08 NOTE — Progress Notes (Signed)
*  PRELIMINARY RESULTS* Echocardiogram 2D Echocardiogram has been performed.  Wallie Char Pantera [place] 08/08/2020, 12:47 PM

## 2020-08-09 NOTE — Discharge Summary (Signed)
Physician Discharge Summary  Jacqueline Deleon [id number] DOB: 10/17/1960 DOA: 08/07/2020  PCP: Derinda Late, MD  Admit date: 08/07/2020 Discharge date: 08/08/2020  Admitted From:Home Disposition:  Home.   Recommendations for Outpatient Follow-up:  1. Follow up with PCP in 1-2 weeks 2. Please obtain BMP/CBC in one week Please follow up with Dr Rockey Situ with cardiology post biopsy to talk about anticoagulation.   Discharge Condition: stable.  CODE STATUS: full code Diet recommendation: Heart Healthy   Brief/Interim Summary:  Jacqueline Deleon is a 60 y.o. female with medical history significant of hypertension, hyperlipidemia, diabetes mellitus, GERD, depression, anxiety, IBS, breast cancer, liver cirrhosis due to [place], OSA on CPAP, who presents with palpitation. She was found to be in atrial fib  With RVR.  She was started on Cardizem drip and transition to oral Cardizem.  Converted to sinus rhythm overnight.  Cardiology consulted and echocardiogram reviewed.  Recommended outpatient follow-up with cardiology to determine anticoagulation as her CHA2DS2-VASc score is about 4.  Patient has history of breast cancer and she was found to have right-sided axillary lymphadenopathy and is scheduled for a biopsy of the nodule on Monday and post procedure she is recommended to follow-up with cardiology to discuss about anticoagulation.  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  Currently she is in normal sinus rhythm on Cardizem 120 mg daily.  Cardiology consulted and recommendations given.   Echocardiogram showed preserved left ventricular ejection fraction.    Essential hypertension Stop amlodipine and continue with the lisinopril.   GERD Continue with proton   Diabetes mellitus Continue with Metformin  Obstructive sleep apnea Continue with CPAP    Liver cirrhosis secondary to [facility] No complications at this time.    History of COVID-19 infection in July 14, 2020 Currently she is asymptomatic and chest x-ray is negative for any infiltrates.     Depression continue with Wellbutrin and Celexa.   History of breast cancer was found to have a right-sided nodule in the right axillary area she is scheduled for a biopsy on Monday.  She was told not to start any aspirin, ibuprofen or blood thinners prior to the procedure.  Discharge Instructions  Discharge Instructions    Diet - low sodium heart healthy   Complete by: As directed    Discharge instructions   Complete by: As directed    Please follow up with Dr Rockey Situ in the clinic  in one week after the biopsy.     Allergies as of 08/08/2020      Reactions   Kiwi Extract Itching, Swelling   Codeine Other (See Comments)   Felt funny.   Grapeseed Extract [nutritional Supplements] Itching, Swelling   Grapes   Amoxicillin Rash   Erythromycin Rash   Sulfa Antibiotics Rash   Tape Rash   adhesives      Medication List    STOP taking these medications   amLODipine 10 MG tablet Commonly known as: NORVASC   ibuprofen 200 MG tablet Commonly known as: ADVIL     TAKE these medications   acetaminophen 325 MG tablet Commonly known as:  TYLENOL Take 650 mg by mouth every 6 (six) hours as needed for mild pain or headache.   buPROPion 300 MG 24 hr tablet Commonly known as: WELLBUTRIN XL Take 300 mg by mouth at bedtime.   citalopram 40 MG tablet Commonly known as: CELEXA Take 40 mg by mouth at bedtime.   clotrimazole 1 % cream Commonly known as: LOTRIMIN Apply 1 application topically daily. groin    diltiazem 120 MG 24 hr capsule Commonly known as: Cardizem CD Take 1 capsule (120 mg total) by mouth daily.   diphenhydrAMINE 25 MG tablet Commonly known as: BENADRYL Take 25 mg by mouth at bedtime as needed for sleep.   ketotifen 0.025 % ophthalmic solution Commonly known as: ZADITOR Place 1 drop into both eyes daily.   lisinopril 10 MG tablet Commonly known as: ZESTRIL Take 10 mg by mouth at bedtime.   metFORMIN 500 MG 24 hr tablet Commonly known as: GLUCOPHAGE-XR Take 500 mg by mouth in the morning and at bedtime.   multivitamin tablet Take 1 tablet by mouth daily.   omeprazole 20 MG capsule Commonly known as: PRILOSEC Take 20 mg by mouth daily.       Allergies  Allergen Reactions  . Kiwi Extract Itching and Swelling  . Codeine Other (See Comments)    Felt funny.  . Grapeseed Extract [Nutritional Supplements] Itching and Swelling    Grapes  . Amoxicillin Rash  . Erythromycin Rash  . Sulfa Antibiotics Rash  . Tape Rash    adhesives    Consultations:  Cardiology.   Procedures/Studies: DG Chest 1 View  Result Date: 07/22/2020 CLINICAL DATA:  Chest pain and shortness of breath EXAM: CHEST  1 VIEW COMPARISON:  Chest CT June 16, 2009 FINDINGS: Lungs are clear. Heart is upper normal in size with pulmonary vascularity normal. No adenopathy. No pneumothorax. No bone lesions. IMPRESSION: Lungs clear.  Heart upper normal in size. Electronically Signed   By: Lowella Grip III M.D.   On: 07/22/2020 15:19   CT Angio Chest PE W and/or Wo Contrast  Result Date: 07/22/2020 CLINICAL DATA:  PE suspected, high prob Cough and shortness of breath since COVID diagnosis 8 days ago EXAM: CT ANGIOGRAPHY CHEST WITH CONTRAST TECHNIQUE: Multidetector CT imaging of the chest was performed using the standard protocol during bolus administration of intravenous contrast. Multiplanar CT image reconstructions and MIPs were obtained to evaluate the vascular anatomy. CONTRAST:  28mL  OMNIPAQUE IOHEXOL 350 MG/ML SOLN COMPARISON:  Radiograph earlier today. FINDINGS: Cardiovascular: There are no filling defects within the pulmonary arteries to suggest pulmonary embolus. Normal caliber thoracic aorta. No dissection or acute aortic findings. Upper normal heart size. No pericardial effusion. Mediastinum/Nodes: There is an abnormal right lobulated lymph node/axillary mass measuring 2.5 cm, series 4, image 24. Prominent adjacent axillary lymph nodes. There is mild adjacent tissue edema. No obvious breast mass. No mediastinal or hilar adenopathy. No visualized thyroid nodule. No esophageal wall thickening, tiny hiatal hernia. Lungs/Pleura: 2 smoothly marginated perifissural nodules adjacent to the right minor and major fissure are most consistent with intrapulmonary lymph nodes. No focal airspace disease or ground-glass opacity typically seen with COVID pneumonia. No findings of pulmonary edema. No pleural fluid. Upper Abdomen: Prominent liver with steatosis partially included. No acute upper abdominal findings. Musculoskeletal: Thoracic spondylosis. There are no acute or suspicious osseous abnormalities. Review of the MIP images confirms the above findings. IMPRESSION: 1. No pulmonary embolus. 2. No evidence of pneumonia. 3. Abnormal right axillary lymph node/axillary mass measuring 2.5  cm with mild adjacent edema. Prominent adjacent axillary lymph nodes. Patient has history of right breast cancer. A right axillary ultrasound in September 2021 demonstrated no focal abnormality. Recommend repeat targeted axillary ultrasound at the breast center. 4. Incidental note of hepatic steatosis. Electronically Signed   By: Keith Rake M.D.   On: 07/22/2020 17:53   DG Chest Port 1 View  Result Date: 08/07/2020 CLINICAL DATA:  60 year old female who woke at 0400 hours with palpitations and shortness of breath. Positive for COVID-19. 07/14/2020. EXAM: PORTABLE CHEST 1 VIEW COMPARISON:  CTA chest 07/22/2020  and earlier. FINDINGS: Portable AP upright view at 0549 hours. Stable lung volumes and mediastinal contours, within normal limits. Visualized tracheal air column is within normal limits. Allowing for portable technique the lungs are clear. No pneumothorax. No acute osseous abnormality identified. Paucity of bowel gas in the upper abdomen. IMPRESSION: Negative portable chest. Electronically Signed   By: Genevie Ann M.D.   On: 08/07/2020 06:02   US BREAST LTD UNI RIGHT INC AXILLA  Result Date: 08/06/2020 CLINICAL DATA:  60 year old female with history of right breast cancer in 2009 status post lumpectomy. Patient presents for evaluation of a right axillary mass identified on recent CT scan. EXAM: ULTRASOUND OF THE RIGHT BREAST COMPARISON:  Previous exam(s). FINDINGS: On physical exam, I feel a firm mass in the low right axilla. Targeted ultrasound is performed in the right axilla demonstrating an irregular hypoechoic mass measuring 2.8 x 1.6 x 1.8 cm. Targeted ultrasound of regional axillary lymph nodes demonstrates no additional abnormal lymph node. IMPRESSION: Suspicious irregular mass in the right axilla measuring 2.8 cm, possibly an abnormal lymph node versus primary malignancy. No additional abnormal lymph nodes are identified sonographically in the right axilla though there do appear to be additional smaller adjacent suspicious masses on the patient's CT scan. RECOMMENDATION: Ultrasound-guided core needle biopsy of the right axillary mass. I have discussed the findings and recommendations with the patient who agrees to proceed with biopsy. The patient will be contacted by our scheduler to make the biopsy appointment BI-RADS CATEGORY  4: Suspicious. Electronically Signed   By: Audie Pinto M.D.   On: 08/06/2020 13:44   ECHOCARDIOGRAM COMPLETE  Result Date: 08/08/2020    ECHOCARDIOGRAM REPORT   Patient Name:   Jacqueline Deleon Date of Exam: 08/08/2020 Medical Rec #:  [id number]      Height:       63.0 in  Accession #:    [id number]     Weight:       257.1 lb Date of Birth:  [birth year]      BSA:          2.151 m Patient Age:    60 years       BP:           123/75 mmHg Patient Gender: F              HR:           74 bpm. Exam Location:  [facility] Procedure: 2D Echo, Color Doppler and Cardiac Doppler Indications:     I48.91 Atrial fibrillation  History:         Patient has no prior history of Echocardiogram examinations.                  Risk Factors:Hypertension. Pt tested positive for COVID-19 on                  07/15/20.  Sonographer:  Charmayne Sheer RDCS (AE) Referring Phys:  [id number] DWAYNE D CALLWOOD Diagnosing Phys: Yolonda Kida MD  Sonographer Comments: Suboptimal parasternal window and suboptimal subcostal window. Image acquisition challenging due to patient body habitus. IMPRESSIONS  1. Left ventricular ejection fraction, by estimation, is 60 to 65%. The left ventricle has normal function. The left ventricle has no regional wall motion abnormalities. Left ventricular diastolic parameters were normal.  2. Right ventricular systolic function is normal. The right ventricular size is normal.  3. The mitral valve is normal in structure. No evidence of mitral valve regurgitation.  4. The aortic valve is grossly normal. Aortic valve regurgitation is not visualized. FINDINGS  Left Ventricle: Left ventricular ejection fraction, by estimation, is 60 to 65%. The left ventricle has normal function. The left ventricle has no regional wall motion abnormalities. The left ventricular internal cavity size was normal in size. There is  no left ventricular hypertrophy. Left ventricular diastolic parameters were normal. Right Ventricle: The right ventricular size is normal. No increase in right ventricular wall thickness. Right ventricular systolic function is normal. Left Atrium: Left atrial size was normal in size. Right Atrium: Right atrial size was normal in size. Pericardium: There is no evidence of pericardial effusion. Mitral  Valve: The mitral valve is normal in structure. No evidence of mitral valve regurgitation. MV peak gradient, 3.1 mmHg. The mean mitral valve gradient is 1.0 mmHg. Tricuspid Valve: The tricuspid valve is normal in structure. Tricuspid valve regurgitation is not demonstrated. Aortic Valve: The aortic valve is grossly normal. Aortic valve regurgitation is not visualized. Aortic valve mean gradient measures 7.0 mmHg. Aortic valve peak gradient measures 15.8 mmHg. Aortic valve area, by VTI measures 2.09 cm. Pulmonic Valve: The pulmonic valve was normal in structure. Pulmonic valve regurgitation is not visualized. Aorta: The ascending aorta was not well visualized. IAS/Shunts: No atrial level shunt detected by color flow Doppler.  LEFT VENTRICLE PLAX 2D LVIDd:         4.90 cm  Diastology LVIDs:         3.40 cm  LV e' medial:    10.90 cm/s LV PW:         1.10 cm  LV E/e' medial:  9.2 LV IVS:        0.80 cm  LV e' lateral:   10.00 cm/s LVOT diam:     1.70 cm  LV E/e' lateral: 10.0 LV SV:         66 LV SV Index:   31 LVOT Area:     2.27 cm  RIGHT VENTRICLE RV Basal diam:  3.30 cm TAPSE (M-mode): 2.3 cm LEFT ATRIUM             Index      RIGHT ATRIUM           Index LA diam:        3.50 cm 1.63 cm/m RA Area:     12.50 cm LA Vol (A2C):   19.4 ml 9.02 ml/m RA Volume:   31.80 ml  14.78 ml/m LA Vol (A4C):   17.9 ml 8.32 ml/m LA Biplane Vol: 19.0 ml 8.83 ml/m  AORTIC VALVE                    PULMONIC VALVE AV Area (Vmax):    1.68 cm     PV Vmax:       1.15 m/s AV Area (Vmean):   1.86 cm     PV Vmean:  81.600 cm/s AV Area (VTI):     2.09 cm     PV VTI:        0.222 m AV Vmax:           199.00 cm/s  PV Peak grad:  5.3 mmHg AV Vmean:          123.000 cm/s PV Mean grad:  3.0 mmHg AV VTI:            0.315 m AV Peak Grad:      15.8 mmHg AV Mean Grad:      7.0 mmHg LVOT Vmax:         147.00 cm/s LVOT Vmean:        101.000 cm/s LVOT VTI:          0.290 m LVOT/AV VTI ratio: 0.92  AORTA Ao Root diam: 2.50 cm MITRAL VALVE MV  Area (PHT): 4.08 cm     SHUNTS MV Area VTI:   2.71 cm     Systemic VTI:  0.29 m MV Peak grad:  3.1 mmHg     Systemic Diam: 1.70 cm MV Mean grad:  1.0 mmHg MV Vmax:       0.88 m/s MV Vmean:      57.0 cm/s MV Decel Time: 186 msec MV E velocity: 100.00 cm/s MV A velocity: 103.00 cm/s MV E/A ratio:  0.97 Dwayne D Callwood MD Electronically signed by Yolonda Kida MD Signature Date/Time: 08/08/2020/1:06:57 PM    Final       Subjective: No new complaints.   Discharge Exam: Vitals:   08/08/20 0737 08/08/20 1124  BP: 123/75 129/80  Pulse: 62 77  Resp: 18 18  Temp: 98.1 F (36.7 C) 98.9 F (37.2 C)  SpO2: 97% 99%   Vitals:   08/08/20 0019 08/08/20 0516 08/08/20 0737 08/08/20 1124  BP: 112/64 119/70 123/75 129/80  Pulse: 61 69 62 77  Resp: 19 20 18 18   Temp: 98.5 F (36.9 C) 98.2 F (36.8 C) 98.1 F (36.7 C) 98.9 F (37.2 C)  TempSrc: Oral Oral  Oral  SpO2: 97% 99% 97% 99%  Weight:  116.6 kg    Height:        General: Pt is alert, awake, not in acute distress Cardiovascular: RRR, S1/S2 +, no rubs, no gallops Respiratory: CTA bilaterally, no wheezing, no rhonchi Abdominal: Soft, NT, ND, bowel sounds + Extremities: no edema, no cyanosis    The results of significant diagnostics from this hospitalization (including imaging, microbiology, ancillary and laboratory) are listed below for reference.     Microbiology: Recent Results (from the past 240 hour(s))  SARS Coronavirus 2 by RT PCR (hospital order, performed in [facility] hospital lab) Nasopharyngeal Nasopharyngeal Swab     Status: None   Collection Time: 08/07/20  6:18 AM   Specimen: Nasopharyngeal Swab  Result Value Ref Range Status   SARS Coronavirus 2 NEGATIVE NEGATIVE Final    Comment: (NOTE) SARS-CoV-2 target nucleic acids are NOT DETECTED.  The SARS-CoV-2 RNA is generally detectable in upper and lower respiratory specimens during the acute phase of infection. The lowest concentration of SARS-CoV-2 viral  copies this assay can detect is 250 copies / mL. A negative result does not preclude SARS-CoV-2 infection and should not be used as the sole basis for treatment or other patient management decisions.  A negative result may occur with improper specimen collection / handling, submission of specimen other than nasopharyngeal swab, presence of viral mutation(s) within the areas targeted by  this assay, and inadequate number of viral copies (<250 copies / mL). A negative result must be combined with clinical observations, patient history, and epidemiological information.  Fact Sheet for Patients:   StrictlyIdeas.no  Fact Sheet for Healthcare Providers: BankingDealers.co.za  This test is not yet approved or  cleared by the [place] FDA and has been authorized for detection and/or diagnosis of SARS-CoV-2 by FDA under an Emergency Use Authorization (EUA).  This EUA will remain in effect (meaning this test can be used) for the duration of the COVID-19 declaration under Section 564(b)(1) of the Act, 21 U.S.C. section 360bbb-3(b)(1), unless the authorization is terminated or revoked sooner.  Performed at [facility], [address] [place], [place] [address]      Labs: BNP (last 3 results) No results for input(s): BNP in the last 8760 hours. Basic Metabolic Panel: Recent Labs  Lab 08/07/20 0544 08/08/20 0550  NA 141 141  K 4.4 4.7  CL 105 103  CO2 23 28  GLUCOSE 105* 98  BUN 13 19  CREATININE 0.81 0.90  CALCIUM 9.3 9.0  MG 2.0  --    Liver Function Tests: Recent Labs  Lab 08/07/20 0544  AST 24  ALT 19  ALKPHOS 114  BILITOT 0.6  PROT 7.6  ALBUMIN 3.8   No results for input(s): LIPASE, AMYLASE in the last 168 hours. No results for input(s): AMMONIA in the last 168 hours. CBC: Recent Labs  Lab 08/07/20 0544 08/08/20 0550  WBC 6.7 5.7  NEUTROABS 4.0  --   HGB 12.6 11.7*  HCT 40.1 37.6  MCV 77.4* 78.7*   PLT 308 292   Cardiac Enzymes: No results for input(s): CKTOTAL, CKMB, CKMBINDEX, TROPONINI in the last 168 hours. BNP: Invalid input(s): POCBNP CBG: Recent Labs  Lab 08/07/20 1212 08/07/20 1557 08/07/20 2107 08/08/20 0732 08/08/20 1126  GLUCAP 125* 96 133* 107* 133*   D-Dimer No results for input(s): DDIMER in the last 72 hours. Hgb A1c No results for input(s): HGBA1C in the last 72 hours. Lipid Profile No results for input(s): CHOL, HDL, LDLCALC, TRIG, CHOLHDL, LDLDIRECT in the last 72 hours. Thyroid function studies Recent Labs    08/07/20 0926  TSH 3.816   Anemia work up No results for input(s): VITAMINB12, FOLATE, FERRITIN, TIBC, IRON, RETICCTPCT in the last 72 hours. Urinalysis    Component Value Date/Time   COLORURINE COLORLESS (A) 08/07/2020 0546   APPEARANCEUR CLEAR (A) 08/07/2020 0546   APPEARANCEUR Clear 05/22/2012 1136   LABSPEC 1.002 (L) 08/07/2020 0546   PHURINE 7.0 08/07/2020 0546   GLUCOSEU NEGATIVE 08/07/2020 0546   HGBUR NEGATIVE 08/07/2020 0546   BILIRUBINUR NEGATIVE 08/07/2020 0546   BILIRUBINUR Negative 05/22/2012 1136   KETONESUR NEGATIVE 08/07/2020 0546   PROTEINUR NEGATIVE 08/07/2020 0546   NITRITE NEGATIVE 08/07/2020 0546   LEUKOCYTESUR NEGATIVE 08/07/2020 0546   Sepsis Labs Invalid input(s): PROCALCITONIN,  WBC,  LACTICIDVEN Microbiology Recent Results (from the past 240 hour(s))  SARS Coronavirus 2 by RT PCR (hospital order, performed in [place] hospital lab) Nasopharyngeal Nasopharyngeal Swab     Status: None   Collection Time: 08/07/20  6:18 AM   Specimen: Nasopharyngeal Swab  Result Value Ref Range Status   SARS Coronavirus 2 NEGATIVE NEGATIVE Final    Comment: (NOTE) SARS-CoV-2 target nucleic acids are NOT DETECTED.  The SARS-CoV-2 RNA is generally detectable in upper and lower respiratory specimens during the acute phase of infection. The lowest concentration of SARS-CoV-2 viral copies this assay can detect  is  250 copies / mL. A negative result does not preclude SARS-CoV-2 infection and should not be used as the sole basis for treatment or other patient management decisions.  A negative result may occur with improper specimen collection / handling, submission of specimen other than nasopharyngeal swab, presence of viral mutation(s) within the areas targeted by this assay, and inadequate number of viral copies (<250 copies / mL). A negative result must be combined with clinical observations, patient history, and epidemiological information.  Fact Sheet for Patients:   StrictlyIdeas.no  Fact Sheet for Healthcare Providers: BankingDealers.co.za  This test is not yet approved or  cleared by the [place] FDA and has been authorized for detection and/or diagnosis of SARS-CoV-2 by FDA under an Emergency Use Authorization (EUA).  This EUA will remain in effect (meaning this test can be used) for the duration of the COVID-19 declaration under Section 564(b)(1) of the Act, 21 U.S.C. section 360bbb-3(b)(1), unless the authorization is terminated or revoked sooner.  Performed at [facility], [address] [place], [place] [address]      Time coordinating discharge:33 minutes.   SIGNED:   Hosie Poisson, MD  [facility] 08/09/2020, 10:49 AM

## 2020-08-11 ENCOUNTER — Ambulatory Visit
Admission: RE | Admit: 2020-08-11 | Discharge: 2020-08-11 | Disposition: A | Discharge status: Home / Self Care | Payer: 59 | Source: Ambulatory Visit | Attending: Obstetrics and Gynecology | Admitting: Obstetrics and Gynecology

## 2020-08-11 ENCOUNTER — Other Ambulatory Visit: Payer: Self-pay

## 2020-08-11 DIAGNOSIS — C773 Secondary and unspecified malignant neoplasm of axilla and upper limb lymph nodes: Secondary | ICD-10-CM | POA: Diagnosis not present

## 2020-08-11 DIAGNOSIS — Z170 Estrogen receptor positive status [ER+]: Secondary | ICD-10-CM | POA: Diagnosis not present

## 2020-08-11 DIAGNOSIS — Z7689 Persons encountering health services in other specified circumstances: Secondary | ICD-10-CM | POA: Diagnosis not present

## 2020-08-11 DIAGNOSIS — R928 Other abnormal and inconclusive findings on diagnostic imaging of breast: Secondary | ICD-10-CM

## 2020-08-11 DIAGNOSIS — R2231 Localized swelling, mass and lump, right upper limb: Secondary | ICD-10-CM | POA: Diagnosis not present

## 2020-08-11 DIAGNOSIS — C50911 Malignant neoplasm of unspecified site of right female breast: Secondary | ICD-10-CM | POA: Diagnosis not present

## 2020-08-11 DIAGNOSIS — C50611 Malignant neoplasm of axillary tail of right female breast: Secondary | ICD-10-CM | POA: Diagnosis not present

## 2020-08-11 HISTORY — PX: BREAST BIOPSY: SHX20

## 2020-08-12 DIAGNOSIS — Z1501 Genetic susceptibility to malignant neoplasm of breast: Secondary | ICD-10-CM

## 2020-08-12 HISTORY — DX: Genetic susceptibility to malignant neoplasm of breast: Z15.01

## 2020-08-13 ENCOUNTER — Other Ambulatory Visit: Payer: Self-pay | Admitting: Obstetrics and Gynecology

## 2020-08-13 DIAGNOSIS — C50911 Malignant neoplasm of unspecified site of right female breast: Secondary | ICD-10-CM

## 2020-08-13 NOTE — Progress Notes (Addendum)
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 Refer to [facility] (used to see Dr. Rochel Brome who has retired).   Pt is BRCA neg, update testing never done. Will do today and f/u with results.

## 2020-08-14 ENCOUNTER — Encounter: Payer: Self-pay | Admitting: Obstetrics and Gynecology

## 2020-08-14 ENCOUNTER — Encounter: Payer: Self-pay | Admitting: *Deleted

## 2020-08-14 DIAGNOSIS — Z803 Family history of malignant neoplasm of breast: Secondary | ICD-10-CM | POA: Diagnosis not present

## 2020-08-14 DIAGNOSIS — Z853 Personal history of malignant neoplasm of breast: Secondary | ICD-10-CM | POA: Diagnosis not present

## 2020-08-14 DIAGNOSIS — Z8042 Family history of malignant neoplasm of prostate: Secondary | ICD-10-CM | POA: Diagnosis not present

## 2020-08-14 DIAGNOSIS — Z800 Family history of malignant neoplasm of digestive organs: Secondary | ICD-10-CM | POA: Diagnosis not present

## 2020-08-14 DIAGNOSIS — C50911 Malignant neoplasm of unspecified site of right female breast: Secondary | ICD-10-CM

## 2020-08-14 NOTE — Progress Notes (Signed)
Received message from Electa Sniff, RN from [facility] that patient had been notified of her new diagnosis of invasive mammary carcinoma of the right breast.  Patient has a history of right breast cancer in 2009.  She was treated with lumpectomy, radiation therapy and antihormonal therapy.  Per notes in her old chart, it looks like she had an oncotype DX score of 6 at that time.  Patient is already scheduled to see Dr. Hampton Abbot tomorrow.  I have scheduled her to see Dr. Janese Banks at 1:45 on 08/16/20.  Will give educational literature at that time.

## 2020-08-14 NOTE — Addendum Note (Signed)
Addended by: Ardeth Perfect B on: 11/18/7414 10:39 AM   Modules accepted: Orders

## 2020-08-15 ENCOUNTER — Ambulatory Visit
Admission: RE | Admit: 2020-08-15 | Discharge: 2020-08-15 | Disposition: A | Discharge status: Home / Self Care | Payer: 59 | Source: Ambulatory Visit | Attending: Oncology | Admitting: Oncology

## 2020-08-15 ENCOUNTER — Other Ambulatory Visit: Payer: Self-pay

## 2020-08-15 ENCOUNTER — Encounter: Payer: Self-pay | Admitting: Surgery

## 2020-08-15 ENCOUNTER — Ambulatory Visit
Admission: RE | Admit: 2020-08-15 | Discharge: 2020-08-15 | Disposition: A | Discharge status: Home / Self Care | Payer: 59 | Attending: Oncology | Admitting: Oncology

## 2020-08-15 ENCOUNTER — Ambulatory Visit (INDEPENDENT_AMBULATORY_CARE_PROVIDER_SITE_OTHER): Payer: 59 | Admitting: Surgery

## 2020-08-15 ENCOUNTER — Inpatient Hospital Stay: Payer: 59

## 2020-08-15 ENCOUNTER — Telehealth: Payer: Self-pay

## 2020-08-15 ENCOUNTER — Encounter: Payer: Self-pay | Admitting: *Deleted

## 2020-08-15 ENCOUNTER — Inpatient Hospital Stay: Payer: 59 | Attending: Oncology | Admitting: Oncology

## 2020-08-15 ENCOUNTER — Encounter: Payer: Self-pay | Admitting: Oncology

## 2020-08-15 VITALS — BP 120/72 | HR 82 | Temp 98.0°F | Ht 63.0 in | Wt 257.2 lb

## 2020-08-15 VITALS — BP 105/49 | HR 89 | Temp 98.8°F | Resp 16 | Ht 63.0 in | Wt 260.0 lb

## 2020-08-15 DIAGNOSIS — Z7984 Long term (current) use of oral hypoglycemic drugs: Secondary | ICD-10-CM | POA: Diagnosis not present

## 2020-08-15 DIAGNOSIS — G8929 Other chronic pain: Secondary | ICD-10-CM

## 2020-08-15 DIAGNOSIS — R590 Localized enlarged lymph nodes: Secondary | ICD-10-CM

## 2020-08-15 DIAGNOSIS — Z853 Personal history of malignant neoplasm of breast: Secondary | ICD-10-CM

## 2020-08-15 DIAGNOSIS — C50911 Malignant neoplasm of unspecified site of right female breast: Secondary | ICD-10-CM | POA: Diagnosis not present

## 2020-08-15 DIAGNOSIS — I4891 Unspecified atrial fibrillation: Secondary | ICD-10-CM | POA: Diagnosis not present

## 2020-08-15 DIAGNOSIS — M1612 Unilateral primary osteoarthritis, left hip: Secondary | ICD-10-CM | POA: Diagnosis not present

## 2020-08-15 DIAGNOSIS — Z923 Personal history of irradiation: Secondary | ICD-10-CM | POA: Insufficient documentation

## 2020-08-15 DIAGNOSIS — C773 Secondary and unspecified malignant neoplasm of axilla and upper limb lymph nodes: Secondary | ICD-10-CM | POA: Diagnosis not present

## 2020-08-15 DIAGNOSIS — G893 Neoplasm related pain (acute) (chronic): Secondary | ICD-10-CM | POA: Insufficient documentation

## 2020-08-15 DIAGNOSIS — Z79899 Other long term (current) drug therapy: Secondary | ICD-10-CM | POA: Insufficient documentation

## 2020-08-15 DIAGNOSIS — Z9221 Personal history of antineoplastic chemotherapy: Secondary | ICD-10-CM | POA: Insufficient documentation

## 2020-08-15 DIAGNOSIS — E119 Type 2 diabetes mellitus without complications: Secondary | ICD-10-CM | POA: Diagnosis not present

## 2020-08-15 DIAGNOSIS — C7951 Secondary malignant neoplasm of bone: Secondary | ICD-10-CM | POA: Insufficient documentation

## 2020-08-15 DIAGNOSIS — C50611 Malignant neoplasm of axillary tail of right female breast: Secondary | ICD-10-CM | POA: Diagnosis not present

## 2020-08-15 DIAGNOSIS — M25552 Pain in left hip: Secondary | ICD-10-CM | POA: Insufficient documentation

## 2020-08-15 DIAGNOSIS — R2231 Localized swelling, mass and lump, right upper limb: Secondary | ICD-10-CM

## 2020-08-15 NOTE — Progress Notes (Signed)
Met patient during her medical oncology consult with Dr. Janese Banks.  Gave patient breast cancer educational literature, "My Breast Cancer Treatment Handbook" by Josephine Igo, RN.  Patient is being scheduled for a mammogram, possible breast mri, PET and x-ray of her left hip prior to treatment plan.  Offered support.  She is to call with any questions or needs.

## 2020-08-15 NOTE — Telephone Encounter (Signed)
Spoke with Melissa at [facility] and got pt scheduled for mammo and [place] on 08/20/2020. They will not do a bilateral due to the L breast was negative and there is no sign of cancer. I r/s pt's appt with Dr. Hampton Abbot for 08/22/2020. LVM for pt about plan.

## 2020-08-15 NOTE — Patient Instructions (Addendum)
We will get your mammogram scheduled and call you later today with that information.  Please see your follow up appointment listed below.   Referral sent to Dr. Rockey Situ. Someone from their office will call to schedule an appointment. If you do not hear from their office let our office know so we can check on this for you.

## 2020-08-15 NOTE — Progress Notes (Signed)
08/15/2020  Reason for Visit:  Recurrent right breast cancer  Referring Provider:  Ardeth Perfect, PA-C  History of Present Illness: Jacqueline Deleon is a 60 y.o. female with a history of right breast cancer, s/p right breast lumpectomy with right axillary sentinel lymph node biopsy in 2009 by Dr. Tamala Julian.  She completed radiation therapy, and hormonal therapy with tamoxifen.  She reports that after radiation, she's had issues with persistent tenderness in the right breast and axilla area, and about 2 months ago started having some tenderness in the right axilla with some radiation to the right arm.  She did not think much about it, but the area felt like it was burning, though there were no skin changes.  She had had a previous mammogram and ultrasound on 04/01/20 which did not see any suspicious findings or masses.    She recently had a bout of COVID-19 and presented to the hospital on 07/22/20 because of shortness of breath.  She had a CTA chest which did not show a PE, but did show a 2.5 cm mass in the right axilla, with surrounding prominent lymph nodes.  The had a right axillary ultrasound on 08/06/20 which confirmed the findings, and underwent a biopsy of this mass on 08/11/20.  The biopsy resulted in invasive mammary carcinoma.  However, when examining the tissue, no lymph node tissue was identified, and it could not be determined if the same was primary tumor within axillary breast tissue, or a metastasis.  However, when evaluating the CT scan, this mass is just inferior to the axillary artery and vein.  No mammogram has been done yet.  Of note, she was admitted to the hospital on 08/07/20 with atrial fibrillation with RVR, which was a new diagnosis for her.  She was started on Diltiazem drip and converted to sinus rhythm.  She was discharged on Diltiazem and follow up with cardiology, but she has not seen anyone yet.  No anticoagulation was started.  Past Medical History: Past Medical History:   Diagnosis Date  . Allergic genetic state   . Arthritis   . Breast cancer ([place])    2009 infiltrating ductal cancer of right breast  . Breast mass 08/2006, 03/2009   left breast biopsy fibroadenoma (2008) right breast biopsy benign (2010)  . 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 Central serous retinopathy   . Chicken pox   . Depression   . Fatty liver   . Fatty liver   . Gastric polyps   . GERD (gastroesophageal reflux disease)   . Gestational diabetes   . Headache    migraines  . Heart murmur   . History of abnormal mammogram 08/2006, 01/2008, 03/2009  . Hypertension   . IBS (irritable bowel syndrome)   . Joint disorder    hx of left ankle tendonitis, bursitis, heel spur  . Obesity 2018   BMI 45  . Pelvic pain    adhesions and adenomyosis  . Personal history of radiation therapy   . Rosacea      Past Surgical History: Past Surgical History:  Procedure Laterality Date  . BREAST BIOPSY Left 08/2006   benign fibroadenoma  . BREAST BIOPSY Right 08/11/2020   [place] bx of LN, hydromarker, path pending  . BREAST EXCISIONAL BIOPSY Right 02/26/2008   right breast invasive mam ca with rad partial mastectomy   . BREAST SURGERY Right    x2/ Dr Tamala Julian  . CARDIAC CATHETERIZATION  2006  .  CESAREAN SECTION  04/02/1998  . CHOLECYSTECTOMY  04/2010   Dr. Smith/ laprascopic surgery  . COLONOSCOPY  04/04/2000  . COLONOSCOPY WITH PROPOFOL N/A 02/12/2019   Procedure: COLONOSCOPY WITH PROPOFOL;  Surgeon: Lollie Sails, MD;  Location: [facility] ENDOSCOPY;  Service: Endoscopy;  Laterality: N/A;  . ESOPHAGOGASTRODUODENOSCOPY (EGD) WITH PROPOFOL N/A 05/01/2015   Procedure: ESOPHAGOGASTRODUODENOSCOPY (EGD) WITH PROPOFOL;  Surgeon: Hulen Luster, MD;  Location: [facility] ENDOSCOPY;  Service: Gastroenterology;  Laterality: N/A;  . ESOPHAGOGASTRODUODENOSCOPY (EGD) WITH PROPOFOL N/A 02/12/2019   Procedure: ESOPHAGOGASTRODUODENOSCOPY (EGD) WITH PROPOFOL;  Surgeon: Lollie Sails, MD;   Location[facility] ENDOSCOPY;  Service: Endoscopy;  Laterality: N/A;  . EYE SURGERY  08/2012   laser surgery on retina/ DR Appenzeller  . FINGER SURGERY     repair of tendon in right index, Dr. Francesco Sor in [place]  . FOOT SURGERY     Dr. Milinda Pointer  . IRRIGATION AND DEBRIDEMENT SEBACEOUS CYST     right upper back  . LAPAROSCOPIC SUPRACERVICAL HYSTERECTOMY  06/2007   Dr Kincius/ adhesions/CPP/adenomyosis  . MASTECTOMY PARTIAL / LUMPECTOMY Right 01/2008   with sentinal lymph node biopsy/ infiltrating ductal cancer  . REPAIR PERONEAL TENDONS ANKLE  10/2019   with tarsal exostectomy and sural neuroma excision on right   . SKIN CANCER EXCISION     back and calves  . WISDOM TOOTH EXTRACTION  1991    Home Medications: Prior to Admission medications   Medication Sig Start Date End Date Taking? Authorizing Provider  acetaminophen (TYLENOL) 325 MG tablet Take 650 mg by mouth every 6 (six) hours as needed for mild pain or headache.   Yes [provider]  buPROPion (WELLBUTRIN XL) 300 MG 24 hr tablet Take 300 mg by mouth at bedtime. 01/18/19 08/15/20 Yes [provider]  citalopram (CELEXA) 40 MG tablet Take 40 mg by mouth at bedtime. 04/10/20  Yes [provider]  clotrimazole (LOTRIMIN) 1 % cream Apply 1 application topically daily. groin   Yes [provider]  diltiazem (CARDIZEM CD) 120 MG 24 hr capsule Take 1 capsule (120 mg total) by mouth daily. 08/08/20 08/08/21 Yes Hosie Poisson, MD  diphenhydrAMINE (BENADRYL) 25 MG tablet Take 25 mg by mouth at bedtime as needed for sleep.   Yes [provider]  ketotifen (ZADITOR) 0.025 % ophthalmic solution Place 1 drop into both eyes daily.   Yes [provider]  lisinopril (PRINIVIL,ZESTRIL) 10 MG tablet Take 10 mg by mouth at bedtime. 03/01/17  Yes [provider]  metFORMIN (GLUCOPHAGE-XR) 500 MG 24 hr tablet Take 500 mg by mouth in the morning and at bedtime. 09/15/15  Yes [provider]   Multiple Vitamin (MULTIVITAMIN) tablet Take 1 tablet by mouth daily.   Yes [provider]  omeprazole (PRILOSEC) 20 MG capsule Take 20 mg by mouth daily.   Yes [provider]    Allergies: Allergies  Allergen Reactions  . Kiwi Extract Itching and Swelling  . Codeine Other (See Comments)    Felt funny.  . Grapeseed Extract [Nutritional Supplements] Itching and Swelling    Grapes  . Amoxicillin Rash  . Erythromycin Rash  . Sulfa Antibiotics Rash  . Tape Rash    adhesives    Social History:  reports that she has never smoked. She has never used smokeless tobacco. She reports current alcohol use. She reports that she does not use drugs.   Family History: Family History  Problem Relation Age of Onset  . Hypertension Mother   .  Thyroid disease Mother   . Breast cancer Mother 59  . Colon cancer Mother 61       again at 35  . Atrial fibrillation Mother   . Hip fracture Mother   . Skin cancer Mother   . Stroke Father   . Hypertension Father   . Cancer Father 15       prostate  . Parkinson's disease Father   . Skin cancer Father   . Hypertension Sister   . Thyroid disease Sister   . Cancer Sister        skin/ squamous cell  . Lung cancer Paternal Aunt 80       smoker  . Diabetes Maternal Grandmother   . Stroke Maternal Grandfather   . Heart disease Paternal Grandfather   . Diabetes Maternal Aunt   . Lymphoma Maternal Uncle   . Thyroid cancer Cousin        maternal  . Heart disease Maternal Uncle   . Cancer Maternal Aunt   . Breast cancer Maternal Aunt 75  . Breast cancer Cousin 51       maternal  . Breast cancer Cousin 19    Review of Systems: Review of Systems  Constitutional: Negative for chills and fever.  HENT: Negative for hearing loss.   Respiratory: Negative for shortness of breath.   Cardiovascular: Negative for chest pain.  Gastrointestinal: Negative for abdominal pain, nausea and vomiting.  Genitourinary: Negative for dysuria.   Musculoskeletal: Positive for joint pain (left hip) and myalgias (right axillary and breast tenderness after radiation).  Skin: Negative for rash.  Neurological: Negative for dizziness.  Psychiatric/Behavioral: Negative for depression.    Physical Exam BP 120/72   Pulse 82   Temp 98 F (36.7 C) (Oral)   Ht $R'5\' 3"'GZ$  (1.6 m)   Wt 257 lb 3.2 oz (116.7 kg)   SpO2 98%   BMI 45.56 kg/m  CONSTITUTIONAL: No acute distress HEENT:  Normocephalic, atraumatic, extraocular motion intact. NECK: Trachea is midline, and there is no jugular venous distension.  RESPIRATORY: Normal respiratory effort without pathologic use of accessory muscles. CARDIOVASCULAR: Regular rhythm and rate. BREAST:  Right breast s/p lumpectomy of lower outer quadrant, with radiation changes at the scar.  No other palpable masses, skin changes, or nipple changes.  Right axilla s/p sentinel lymph node biopsy and recent percutaneous biopsy.  Steri strips removed, showing some ecchymosis.  Palpable 2 cm mass in the axilla consistent with CT scan findings. GI: The abdomen is soft, obese, non-distended, non-tender.  MUSCULOSKELETAL:  Normal muscle strength and tone in all four extremities.  No peripheral edema or cyanosis. SKIN: Skin turgor is normal. There are no pathologic skin lesions.  NEUROLOGIC:  Motor and sensation is grossly normal.  Cranial nerves are grossly intact. PSYCH:  Alert and oriented to person, place and time. Affect is normal.  Laboratory Analysis: Labs from 08/07/20: Na 141, K 4.4, Cl 105, CO2 23, BUN 13, Cr 0.81.  Total bilirubin 0.6, AST 24, ALT 19, Alk Phos 114.  WBC 6.7, Hgb 12.6, Hct 40.1, Plt 308.  Pathology report 08/11/20: DIAGNOSIS:  A. SOFT TISSUE MASS, RIGHT AXILLA; ULTRASOUND-GUIDED CORE BIOPSY:  - INVASIVE MAMMARY CARCINOMA.   The carcinoma shows absence of tubule/gland formation and moderate nuclear pleomorphism. The mitotic rate is high, 18 per 10 high power fields (field diameter 0.55 mm). The  breast carcinoma histologic grade (Nottingham Histologic Score) is grade 3 (total score 8).  No lymph node tissue is identified. There is scant  fibroadipose tissue,  and no non-neoplastic breast ducts or lobules are seen. It cannot be  determined from this sample if it is a primary tumor within axillary  breast tissue, or a metastasis. Further clinical workup may be needed.   Imaging: CTA Chest 07/22/20: IMPRESSION: 1. No pulmonary embolus. 2. No evidence of pneumonia. 3. Abnormal right axillary lymph node/axillary mass measuring 2.5 cm with mild adjacent edema. Prominent adjacent axillary lymph nodes. Patient has history of right breast cancer. A right axillary ultrasound in September 2021 demonstrated no focal abnormality. Recommend repeat targeted axillary ultrasound at the breast center. 4. Incidental note of hepatic steatosis.  Ultrasound Right Axilla 08/06/20: IMPRESSION: Suspicious irregular mass in the right axilla measuring 2.8 cm, possibly an abnormal lymph node versus primary malignancy. No additional abnormal lymph nodes are identified sonographically in the right axilla though there do appear to be additional smaller adjacent suspicious masses on the patient's CT scan.  RECOMMENDATION: Ultrasound-guided core needle biopsy of the right axillary mass.  Assessment and Plan: This is a 60 y.o. female with recurrent right breast cancer  --Discussed with the patient the pathology results as well as reviewed all the imaging findings with her.  Although the biopsy report mentions that no lymph node tissue was seen in the specimen, the mass of concern is just inferior to the right axillary artery and vein.  However, no diagnostic bilateral mammogram has been done yet.  I think this needs to be done first to help with the staging process and to determine if there is a breast mass as well or if this is metastatic lymph node.  Discussed with her the possibility of needing a breast  MRI if the mammogram does not show any masses.   --Discussed with her the possibility of needing axillary lymph node dissection to remove all the lymph nodes and not just a sentinel node biopsy.  Discussed with her the possibility of needing a mastectomy as well, given that she's had a lumpectomy with radiation already.  Radiation was done in 2009, so unsure if she could qualify again given the time span since.  Would also need a port-a-cath likely for chemotherapy.  This patient will need to be reviewed in breast tumor conference.  --Will order mammogram and the patient will follow up with me afterwards to discuss the findings and discuss surgical plan of action going forwards. --In the meantime, will also send referral to cardiology for evaluation of her new atrial fibrillation, as she would also likely need cardiology clearance for surgery.  Face-to-face time spent with the patient and care providers was 60 minutes, with more than 50% of the time spent counseling, educating, and coordinating care of the patient.     Melvyn Neth, [facility]

## 2020-08-18 ENCOUNTER — Encounter: Payer: Self-pay | Admitting: Oncology

## 2020-08-18 ENCOUNTER — Ambulatory Visit
Admission: RE | Admit: 2020-08-18 | Discharge: 2020-08-18 | Disposition: A | Discharge status: Home / Self Care | Payer: 59 | Source: Ambulatory Visit | Attending: Oncology | Admitting: Oncology

## 2020-08-18 ENCOUNTER — Other Ambulatory Visit: Payer: Self-pay

## 2020-08-18 ENCOUNTER — Other Ambulatory Visit: Payer: Self-pay | Admitting: Oncology

## 2020-08-18 ENCOUNTER — Other Ambulatory Visit: Payer: Self-pay | Admitting: *Deleted

## 2020-08-18 DIAGNOSIS — C50911 Malignant neoplasm of unspecified site of right female breast: Secondary | ICD-10-CM | POA: Diagnosis not present

## 2020-08-18 DIAGNOSIS — G8929 Other chronic pain: Secondary | ICD-10-CM | POA: Insufficient documentation

## 2020-08-18 DIAGNOSIS — C50611 Malignant neoplasm of axillary tail of right female breast: Secondary | ICD-10-CM | POA: Diagnosis not present

## 2020-08-18 DIAGNOSIS — R222 Localized swelling, mass and lump, trunk: Secondary | ICD-10-CM | POA: Insufficient documentation

## 2020-08-18 DIAGNOSIS — M25552 Pain in left hip: Secondary | ICD-10-CM | POA: Insufficient documentation

## 2020-08-18 DIAGNOSIS — C7951 Secondary malignant neoplasm of bone: Secondary | ICD-10-CM | POA: Diagnosis not present

## 2020-08-18 DIAGNOSIS — K5730 Diverticulosis of large intestine without perforation or abscess without bleeding: Secondary | ICD-10-CM | POA: Insufficient documentation

## 2020-08-18 LAB — GLUCOSE, CAPILLARY: Glucose-Capillary: 92 mg/dL (ref 70–99)

## 2020-08-18 MED ORDER — MELOXICAM 7.5 MG PO TBDP: 7.5000 mg | ORAL_TABLET | Freq: Every day | ORAL | 0 refills | Status: DC | PRN

## 2020-08-18 MED ORDER — FLUDEOXYGLUCOSE F - 18 (FDG) INJECTION
13.5000 | Freq: Once | INTRAVENOUS | Status: AC | PRN
  Administered 2020-08-18: 13.81 via INTRAVENOUS

## 2020-08-18 NOTE — Telephone Encounter (Signed)
Pt sent a my chart message about pain and dr Janese Banks wanted to give her tramadol and the pt states that it makes her dizzy and off balance, called back and spoke to pt and we will call in meloxicam in which the pt says it usually helps for pain. I did tell pt that if the pet is normal then the patient would need to be referrred for the pain . Pt agreeable

## 2020-08-18 NOTE — Progress Notes (Signed)
Hematology/Oncology Consult note [facility] Telephone:([phone] [phone]  Patient Care Team: Derinda Late, MD as PCP - General (Family Medicine)   Name of the patient: Jacqueline Deleon  [id number]  1961-01-19    Reason for referral-new diagnosis of breast cancer Referring physician-Dr. Baldemar Lenis  Date of visit: 08/18/20   History of presenting illness- Patient is a 61 year old female with a past medical history significant for stage I right breast cancer diagnosed in 2009.  It was a 1.4 cm tumor with negative lymph nodes grade 1 and patient went on to get lumpectomy followed by adjuvant radiation therapy.  Oncotype DX score was 6 and she did not require adjuvant chemotherapy.  She was on tamoxifen for 5 years.  She had BRCA testing done at that time which was negative.  More recently patient felt a lump in her right axilla in September 2021.  She underwent ultrasound of bilateral breasts which did not pick up any axillary mass.  A prior screening mammogram in September 2021 was unremarkable.  She was then admitted for Covid pneumonia and underwent CT angio chest on 07/22/2020 which incidentally picked up an axillary mass measuring 2.5 cm.  No obvious breast mass.  Axillary mass was biopsied and was consistent with high-grade invasive mammary carcinoma.  IHC was positive for GATA3 with diffuse strong nuclear staining.  There was no lymph node architecture identified in the sample and it could not be determined if it is a primary tumor within the axillary breast tissue or metastases.  ER/PR and HER-2 are currently pending.  Patient referred for further management  She presently reports some fatigue as well as exertional shortness of breath.  She is also complaining of left hip pain which has been ongoing for the last 2 to 3 weeks for which she has been taking as needed ibuprofen.  Menarche at the age of 60.  G14, P42 L1.  Used birth control for about 8 years in the  remote past.  She has had a hysterectomy in the past but ovaries are still in situ.  Family history significant for mother with breast cancer skin cancer and colon cancer.  Dad with prostate and skin cancer.  History of breast cancer in maternal aunt and 2 maternal first cousins and paternal first cousin.  History of colon cancer in maternal first cousin.  ECOG PS- 1  Pain scale- 0   Review of systems- Review of Systems  Constitutional: Positive for malaise/fatigue. Negative for chills, fever and weight loss.  HENT: Negative for congestion, ear discharge and nosebleeds.   Eyes: Negative for blurred vision.  Respiratory: Positive for shortness of breath. Negative for cough, hemoptysis, sputum production and wheezing.   Cardiovascular: Negative for chest pain, palpitations, orthopnea and claudication.  Gastrointestinal: Negative for abdominal pain, blood in stool, constipation, diarrhea, heartburn, melena, nausea and vomiting.  Genitourinary: Negative for dysuria, flank pain, frequency, hematuria and urgency.  Musculoskeletal: Negative for back pain, joint pain (Left hip pain) and myalgias.  Skin: Negative for rash.  Neurological: Negative for dizziness, tingling, focal weakness, seizures, weakness and headaches.  Endo/Heme/Allergies: Does not bruise/bleed easily.  Psychiatric/Behavioral: Negative for depression and suicidal ideas. The patient does not have insomnia.     Allergies  Allergen Reactions  . Kiwi Extract Itching and Swelling    The real kiwi fruit  . Codeine Other (See Comments)    Felt funny.  Lorenza Chick Extract [Nutritional Supplements] Itching and Swelling    Raw Grapes only if cooked  it is ok  . Amoxicillin Rash  . Erythromycin Rash  . Sulfa Antibiotics Rash  . Tape Rash    adhesives    Patient Active Problem List   Diagnosis Date Noted  . Atrial flutter with rapid ventricular response ([place]) 08/07/2020  . COVID-19 virus infection 08/07/2020  . Depression  08/07/2020  . 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 OSA on CPAP 05/15/2018  . Non-alcoholic fatty liver disease 03/06/2017  . Rosacea   . GERD (gastroesophageal reflux disease)   . Central serous retinopathy   . IBS (irritable bowel syndrome)   . Hyperlipidemia, mixed 01/24/2017  . Dyspnea on effort 12/22/2016  . Anxiety, generalized 04/29/2016  . Diabetes mellitus without complication ([place]) 69/48/5462  . Cancer of the skin, basal cell 03/12/2016  . Contact dermatitis 04/23/2013  . Nodule, subcutaneous 07/24/2012  . Localized swelling, mass and lump, unspecified 07/24/2012  . Hypertension 12/20/2011  . Personal history of breast cancer 12/20/2011  . Obesity 12/20/2011     Past Medical History:  Diagnosis Date  . Allergic genetic state   . Arthritis   . Breast cancer ([place])    2009 infiltrating ductal cancer of right breast  . Breast mass 08/2006, 03/2009   left breast biopsy fibroadenoma (2008) right breast biopsy benign (2010)  . 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 Central serous retinopathy   . Chicken pox   . Depression   . Fatty liver   . Fatty liver   . Gastric polyps   . GERD (gastroesophageal reflux disease)   . Gestational diabetes    prediabetes out side of having baby  . Headache    migraines  . Heart murmur   . History of abnormal mammogram 08/2006, 01/2008, 03/2009  . Hypertension   . IBS (irritable bowel syndrome)   . Joint disorder    hx of left ankle tendonitis, bursitis, heel spur  . Obesity 2018   BMI 45  . Pelvic pain    adhesions and adenomyosis  . Personal history of radiation therapy   . Rosacea      Past Surgical History:  Procedure Laterality Date  . ABDOMINAL HYSTERECTOMY    . BREAST BIOPSY Left 08/2006   benign fibroadenoma  . BREAST BIOPSY Right 08/11/2020   [place] bx of LN, hydromarker, path pending  . BREAST EXCISIONAL BIOPSY Right 02/26/2008   right breast invasive mam ca with rad partial  mastectomy   . BREAST SURGERY Right    x2/ Dr Tamala Julian  . CARDIAC CATHETERIZATION  2006  . CESAREAN SECTION  04/02/1998  . CHOLECYSTECTOMY  04/2010   Dr. Smith/ laprascopic surgery  . COLONOSCOPY  04/04/2000  . COLONOSCOPY WITH PROPOFOL N/A 02/12/2019   Procedure: COLONOSCOPY WITH PROPOFOL;  Surgeon: Lollie Sails, MD;  Location: [facility] ENDOSCOPY;  Service: Endoscopy;  Laterality: N/A;  . ESOPHAGOGASTRODUODENOSCOPY (EGD) WITH PROPOFOL N/A 05/01/2015   Procedure: ESOPHAGOGASTRODUODENOSCOPY (EGD) WITH PROPOFOL;  Surgeon: Hulen Luster, MD;  Location[facility] ENDOSCOPY;  Service: Gastroenterology;  Laterality: N/A;  . ESOPHAGOGASTRODUODENOSCOPY (EGD) WITH PROPOFOL N/A 02/12/2019   Procedure: ESOPHAGOGASTRODUODENOSCOPY (EGD) WITH PROPOFOL;  Surgeon: Lollie Sails, MD;  Location: [facility] ENDOSCOPY;  Service: Endoscopy;  Laterality: N/A;  . EYE SURGERY  08/2012   laser surgery on retina/ DR Appenzeller  . FINGER SURGERY     repair of tendon in right index, Dr. Francesco Sor in [place]  . FOOT SURGERY     Dr.  Hyatt  . IRRIGATION AND DEBRIDEMENT SEBACEOUS CYST     right upper back  . LAPAROSCOPIC SUPRACERVICAL HYSTERECTOMY  06/2007   Dr Kincius/ adhesions/CPP/adenomyosis  . MASTECTOMY PARTIAL / LUMPECTOMY Right 01/2008   with sentinal lymph node biopsy/ infiltrating ductal cancer  . REPAIR PERONEAL TENDONS ANKLE  10/2019   with tarsal exostectomy and sural neuroma excision on right   . SKIN CANCER EXCISION     back and calves  . WISDOM TOOTH EXTRACTION  1991    Social History   Socioeconomic History  . Marital status: Married    Spouse name: Richardson Landry  . Number of children: 1  . Years of education: 7  . Highest education level: Not on file  Occupational History  . Occupation: CMA  Tobacco Use  . Smoking status: Never Smoker  . Smokeless tobacco: Never Used  Vaping Use  . Vaping Use: Never used  Substance and Sexual Activity  . Alcohol use: Yes    Alcohol/week: 0.0 standard drinks     Comment: rare, 1-2 drinks per year  . Drug use: No  . Sexual activity: Yes    Partners: Male    Birth control/protection: Surgical    Comment: Hysterectomy   Other Topics Concern  . Not on file  Social History Narrative   Lives in [place] with husband, no pets.  Works at [employer].      Diet - regular   Exercise - occasional   Social Determinants of Health   Financial Resource Strain: Not on file  Food Insecurity: Not on file  Transportation Needs: Not on file  Physical Activity: Not on file  Stress: Not on file  Social Connections: Not on file  Intimate Partner Violence: Not on file     Family History  Problem Relation Age of Onset  . Hypertension Mother   . Thyroid disease Mother   . Breast cancer Mother 74  . Colon cancer Mother 64       again at 72  . Atrial fibrillation Mother   . Hip fracture Mother   . Skin cancer Mother   . Stroke Father   . Hypertension Father   . Cancer Father 42       prostate  . Parkinson's disease Father   . Skin cancer Father   . Hypertension Sister   . Thyroid disease Sister   . Cancer Sister        skin/ squamous cell  . Lung cancer Paternal Aunt 80       smoker  . Diabetes Maternal Grandmother   . Stroke Maternal Grandfather   . Heart disease Paternal Grandfather   . Diabetes Maternal Aunt   . Lymphoma Maternal Uncle   . Thyroid cancer Cousin        maternal  . Heart disease Maternal Uncle   . Cancer Maternal Aunt   . Breast cancer Maternal Aunt 75  . Breast cancer Cousin 60       maternal  . Breast cancer Cousin 76  . Colon cancer Cousin      Current Outpatient Medications:  .  acetaminophen (TYLENOL) 325 MG tablet, Take 650 mg by mouth every 6 (six) hours as needed for mild pain or headache., Disp: , Rfl:  .  buPROPion (WELLBUTRIN XL) 300 MG 24 hr tablet, Take 300 mg by mouth at bedtime., Disp: , Rfl:  .  citalopram (CELEXA) 40 MG tablet, Take 40 mg by mouth at bedtime., Disp: , Rfl:  .  clotrimazole (  LOTRIMIN) 1  % cream, Apply 1 application topically daily. groin, Disp: , Rfl:  .  diltiazem (CARDIZEM CD) 120 MG 24 hr capsule, Take 1 capsule (120 mg total) by mouth daily., Disp: 30 capsule, Rfl: 11 .  diphenhydrAMINE (BENADRYL) 25 MG tablet, Take 25 mg by mouth at bedtime as needed for sleep., Disp: , Rfl:  .  ketotifen (ZADITOR) 0.025 % ophthalmic solution, Place 1 drop into both eyes daily., Disp: , Rfl:  .  lisinopril (PRINIVIL,ZESTRIL) 10 MG tablet, Take 10 mg by mouth at bedtime., Disp: , Rfl:  .  metFORMIN (GLUCOPHAGE-XR) 500 MG 24 hr tablet, Take 500 mg by mouth in the morning and at bedtime., Disp: , Rfl:  .  Multiple Vitamin (MULTIVITAMIN) tablet, Take 1 tablet by mouth daily., Disp: , Rfl:  .  omeprazole (PRILOSEC) 20 MG capsule, Take 20 mg by mouth daily., Disp: , Rfl:    Physical exam:  Vitals:   08/15/20 1352  BP: (!) 105/49  Pulse: 89  Resp: 16  Temp: 98.8 F (37.1 C)  TempSrc: Tympanic  Weight: 260 lb (117.9 kg)  Height: $Remove'5\' 3"'xwTenYB$  (1.6 m)   Physical Exam Constitutional:      General: She is not in acute distress.    Comments: Patient is ambulating with a limp  Eyes:     Extraocular Movements: EOM normal.  Cardiovascular:     Rate and Rhythm: Normal rate and regular rhythm.     Heart sounds: Normal heart sounds.  Pulmonary:     Effort: Pulmonary effort is normal.     Breath sounds: Normal breath sounds.  Abdominal:     General: Bowel sounds are normal.     Palpations: Abdomen is soft.  Skin:    General: Skin is warm and dry.  Neurological:     Mental Status: She is alert and oriented to person, place, and time.    Breast exam was performed in seated and lying down position. Patient is status post right lumpectomy with a well-healed surgical scar. No evidence of any palpable masses.  There is an ill-defined axillary mass of about 2 cm in the right axilla.  No evidence of any palpable masses or lumps in the left breast. No evidence of leftt axillary adenopathy     CMP  Latest Ref Rng & Units 08/08/2020  Glucose 70 - 99 mg/dL 98  BUN 6 - 20 mg/dL 19  Creatinine 0.44 - 1.00 mg/dL 0.90  Sodium 135 - 145 mmol/L 141  Potassium 3.5 - 5.1 mmol/L 4.7  Chloride 98 - 111 mmol/L 103  CO2 22 - 32 mmol/L 28  Calcium 8.9 - 10.3 mg/dL 9.0  Total Protein 6.5 - 8.1 g/dL -  Total Bilirubin 0.3 - 1.2 mg/dL -  Alkaline Phos 38 - 126 U/L -  AST 15 - 41 U/L -  ALT 0 - 44 U/L -   CBC Latest Ref Rng & Units 08/08/2020  WBC 4.0 - 10.5 K/uL 5.7  Hemoglobin 12.0 - 15.0 g/dL 11.7(L)  Hematocrit 36.0 - 46.0 % 37.6  Platelets 150 - 400 K/uL 292    No images are attached to the encounter.  DG Chest 1 View  Result Date: 07/22/2020 CLINICAL DATA:  Chest pain and shortness of breath EXAM: CHEST  1 VIEW COMPARISON:  Chest CT June 16, 2009 FINDINGS: Lungs are clear. Heart is upper normal in size with pulmonary vascularity normal. No adenopathy. No pneumothorax. No bone lesions. IMPRESSION: Lungs clear.  Heart upper normal in  size. Electronically Signed   By: Lowella Grip III M.D.   On: 07/22/2020 15:19   CT Angio Chest PE W and/or Wo Contrast  Result Date: 07/22/2020 CLINICAL DATA:  PE suspected, high prob Cough and shortness of breath since COVID diagnosis 8 days ago EXAM: CT ANGIOGRAPHY CHEST WITH CONTRAST TECHNIQUE: Multidetector CT imaging of the chest was performed using the standard protocol during bolus administration of intravenous contrast. Multiplanar CT image reconstructions and MIPs were obtained to evaluate the vascular anatomy. CONTRAST:  47mL OMNIPAQUE IOHEXOL 350 MG/ML SOLN COMPARISON:  Radiograph earlier today. FINDINGS: Cardiovascular: There are no filling defects within the pulmonary arteries to suggest pulmonary embolus. Normal caliber thoracic aorta. No dissection or acute aortic findings. Upper normal heart size. No pericardial effusion. Mediastinum/Nodes: There is an abnormal right lobulated lymph node/axillary mass measuring 2.5 cm, series 4, image 24.  Prominent adjacent axillary lymph nodes. There is mild adjacent tissue edema. No obvious breast mass. No mediastinal or hilar adenopathy. No visualized thyroid nodule. No esophageal wall thickening, tiny hiatal hernia. Lungs/Pleura: 2 smoothly marginated perifissural nodules adjacent to the right minor and major fissure are most consistent with intrapulmonary lymph nodes. No focal airspace disease or ground-glass opacity typically seen with COVID pneumonia. No findings of pulmonary edema. No pleural fluid. Upper Abdomen: Prominent liver with steatosis partially included. No acute upper abdominal findings. Musculoskeletal: Thoracic spondylosis. There are no acute or suspicious osseous abnormalities. Review of the MIP images confirms the above findings. IMPRESSION: 1. No pulmonary embolus. 2. No evidence of pneumonia. 3. Abnormal right axillary lymph node/axillary mass measuring 2.5 cm with mild adjacent edema. Prominent adjacent axillary lymph nodes. Patient has history of right breast cancer. A right axillary ultrasound in September 2021 demonstrated no focal abnormality. Recommend repeat targeted axillary ultrasound at the breast center. 4. Incidental note of hepatic steatosis. Electronically Signed   By: Keith Rake M.D.   On: 07/22/2020 17:53   DG Chest Port 1 View  Result Date: 08/07/2020 CLINICAL DATA:  60 year old female who woke at 0400 hours with palpitations and shortness of breath. Positive for COVID-19. 07/14/2020. EXAM: PORTABLE CHEST 1 VIEW COMPARISON:  CTA chest 07/22/2020 and earlier. FINDINGS: Portable AP upright view at 0549 hours. Stable lung volumes and mediastinal contours, within normal limits. Visualized tracheal air column is within normal limits. Allowing for portable technique the lungs are clear. No pneumothorax. No acute osseous abnormality identified. Paucity of bowel gas in the upper abdomen. IMPRESSION: Negative portable chest. Electronically Signed   By: Genevie Ann M.D.   On:  08/07/2020 06:02   US BREAST LTD UNI RIGHT INC AXILLA  Result Date: 08/06/2020 CLINICAL DATA:  60 year old female with history of right breast cancer in 2009 status post lumpectomy. Patient presents for evaluation of a right axillary mass identified on recent CT scan. EXAM: ULTRASOUND OF THE RIGHT BREAST COMPARISON:  Previous exam(s). FINDINGS: On physical exam, I feel a firm mass in the low right axilla. Targeted ultrasound is performed in the right axilla demonstrating an irregular hypoechoic mass measuring 2.8 x 1.6 x 1.8 cm. Targeted ultrasound of regional axillary lymph nodes demonstrates no additional abnormal lymph node. IMPRESSION: Suspicious irregular mass in the right axilla measuring 2.8 cm, possibly an abnormal lymph node versus primary malignancy. No additional abnormal lymph nodes are identified sonographically in the right axilla though there do appear to be additional smaller adjacent suspicious masses on the patient's CT scan. RECOMMENDATION: Ultrasound-guided core needle biopsy of the right axillary mass. I have discussed  the findings and recommendations with the patient who agrees to proceed with biopsy. The patient will be contacted by our scheduler to make the biopsy appointment BI-RADS CATEGORY  4: Suspicious. Electronically Signed   By: Audie Pinto M.D.   On: 08/06/2020 13:44   ECHOCARDIOGRAM COMPLETE  Result Date: 08/08/2020    ECHOCARDIOGRAM REPORT   Patient Name:   Talin SHUREE BROSSART Date of Exam: 08/08/2020 Medical Rec #:  [id number]      Height:       63.0 in Accession #:    [id number]     Weight:       257.1 lb Date of Birth:  Apr 13, 1961      BSA:          2.151 m Patient Age:    43 years       BP:           123/75 mmHg Patient Gender: F              HR:           74 bpm. Exam Location:  [facility] Procedure: 2D Echo, Color Doppler and Cardiac Doppler Indications:     I48.91 Atrial fibrillation  History:         Patient has no prior history of Echocardiogram examinations.                   Risk Factors:Hypertension. Pt tested positive for COVID-19 on                  07/15/20.  Sonographer:     Charmayne Sheer RDCS (AE) Referring Phys:  [id number] DWAYNE D CALLWOOD Diagnosing Phys: Yolonda Kida MD  Sonographer Comments: Suboptimal parasternal window and suboptimal subcostal window. Image acquisition challenging due to patient body habitus. IMPRESSIONS  1. Left ventricular ejection fraction, by estimation, is 60 to 65%. The left ventricle has normal function. The left ventricle has no regional wall motion abnormalities. Left ventricular diastolic parameters were normal.  2. Right ventricular systolic function is normal. The right ventricular size is normal.  3. The mitral valve is normal in structure. No evidence of mitral valve regurgitation.  4. The aortic valve is grossly normal. Aortic valve regurgitation is not visualized. FINDINGS  Left Ventricle: Left ventricular ejection fraction, by estimation, is 60 to 65%. The left ventricle has normal function. The left ventricle has no regional wall motion abnormalities. The left ventricular internal cavity size was normal in size. There is  no left ventricular hypertrophy. Left ventricular diastolic parameters were normal. Right Ventricle: The right ventricular size is normal. No increase in right ventricular wall thickness. Right ventricular systolic function is normal. Left Atrium: Left atrial size was normal in size. Right Atrium: Right atrial size was normal in size. Pericardium: There is no evidence of pericardial effusion. Mitral Valve: The mitral valve is normal in structure. No evidence of mitral valve regurgitation. MV peak gradient, 3.1 mmHg. The mean mitral valve gradient is 1.0 mmHg. Tricuspid Valve: The tricuspid valve is normal in structure. Tricuspid valve regurgitation is not demonstrated. Aortic Valve: The aortic valve is grossly normal. Aortic valve regurgitation is not visualized. Aortic valve mean gradient measures 7.0 mmHg. Aortic valve  peak gradient measures 15.8 mmHg. Aortic valve area, by VTI measures 2.09 cm. Pulmonic Valve: The pulmonic valve was normal in structure. Pulmonic valve regurgitation is not visualized. Aorta: The ascending aorta was not well visualized. IAS/Shunts: No atrial level shunt detected by color flow Doppler.  LEFT  VENTRICLE PLAX 2D LVIDd:         4.90 cm  Diastology LVIDs:         3.40 cm  LV e' medial:    10.90 cm/s LV PW:         1.10 cm  LV E/e' medial:  9.2 LV IVS:        0.80 cm  LV e' lateral:   10.00 cm/s LVOT diam:     1.70 cm  LV E/e' lateral: 10.0 LV SV:         66 LV SV Index:   31 LVOT Area:     2.27 cm  RIGHT VENTRICLE RV Basal diam:  3.30 cm TAPSE (M-mode): 2.3 cm LEFT ATRIUM             Index      RIGHT ATRIUM           Index LA diam:        3.50 cm 1.63 cm/m RA Area:     12.50 cm LA Vol (A2C):   19.4 ml 9.02 ml/m RA Volume:   31.80 ml  14.78 ml/m LA Vol (A4C):   17.9 ml 8.32 ml/m LA Biplane Vol: 19.0 ml 8.83 ml/m  AORTIC VALVE                    PULMONIC VALVE AV Area (Vmax):    1.68 cm     PV Vmax:       1.15 m/s AV Area (Vmean):   1.86 cm     PV Vmean:      81.600 cm/s AV Area (VTI):     2.09 cm     PV VTI:        0.222 m AV Vmax:           199.00 cm/s  PV Peak grad:  5.3 mmHg AV Vmean:          123.000 cm/s PV Mean grad:  3.0 mmHg AV VTI:            0.315 m AV Peak Grad:      15.8 mmHg AV Mean Grad:      7.0 mmHg LVOT Vmax:         147.00 cm/s LVOT Vmean:        101.000 cm/s LVOT VTI:          0.290 m LVOT/AV VTI ratio: 0.92  AORTA Ao Root diam: 2.50 cm MITRAL VALVE MV Area (PHT): 4.08 cm     SHUNTS MV Area VTI:   2.71 cm     Systemic VTI:  0.29 m MV Peak grad:  3.1 mmHg     Systemic Diam: 1.70 cm MV Mean grad:  1.0 mmHg MV Vmax:       0.88 m/s MV Vmean:      57.0 cm/s MV Decel Time: 186 msec MV E velocity: 100.00 cm/s MV A velocity: 103.00 cm/s MV E/A ratio:  0.97 Dwayne D Callwood MD Electronically signed by Yolonda Kida MD Signature Date/Time: 08/08/2020/1:06:57 PM    Final    MM  CLIP PLACEMENT RIGHT  Result Date: 08/11/2020 CLINICAL DATA:  Status post ultrasound-guided core biopsy of RIGHT axillary mass. EXAM: DIAGNOSTIC RIGHT MAMMOGRAM POST ULTRASOUND BIOPSY COMPARISON:  Previous exam(s). FINDINGS: Mammographic images were obtained following ultrasound guided biopsy of RIGHT axillary mass and placement of a butterfly HydroMARK shaped clip. The biopsy marking clip is in expected position at the site of biopsy. IMPRESSION:  Appropriate positioning of the butterfly HydroMARK shaped biopsy marking clip at the site of biopsy in the location. Final Assessment: Post Procedure Mammograms for Marker Placement Electronically Signed   By: Nolon Nations M.D.   On: 08/11/2020 08:58   DG HIP UNILAT WITH PELVIS 2-3 VIEWS LEFT  Result Date: 08/16/2020 CLINICAL DATA:  Chronic pain in the left hip. EXAM: DG HIP (WITH OR WITHOUT PELVIS) 2-3V LEFT COMPARISON:  None. FINDINGS: There is no evidence of hip fracture or dislocation. Mild osteoarthritic changes of bilateral hips with subchondral sclerosis of the acetabula and minimal osteophytosis. Soft tissues are normal. IMPRESSION: 1. No acute fracture or dislocation identified about the left hip. 2. Mild osteoarthritic changes of bilateral hips. Electronically Signed   By: Fidela Salisbury M.D.   On: 08/16/2020 16:17   [place] RT BREAST BX W LOC DEV 1ST LESION IMG BX SPEC US GUIDE  Addendum Date: 08/15/2020   ADDENDUM REPORT: 08/15/2020 14:13 ADDENDUM: PATHOLOGY revealed: A. SOFT TISSUE MASS, RIGHT AXILLA; ULTRASOUND-GUIDED CORE BIOPSY: - INVASIVE MAMMARY CARCINOMA. Grade 3. Comment: Invasive carcinoma involves nearly the full length of the cores, and measures 16 mm in this sample. No carcinoma in situ is identified. The carcinoma shows absence of tubule/gland formation and moderate nuclear pleomorphism. The mitotic rate is high, 18 per 10 high power fields (field diameter 0.55 mm). Pathology results are CONCORDANT with imaging findings, per Dr.  Nolon Nations. Pathology results and recommendations below were discussed with patient by telephone on 08/13/2020. Patient reported biopsy site within normal limits with slight tenderness at the site. Post biopsy care instructions were reviewed, questions were answered and my direct phone number was provided to patient. Patient was instructed to call [facility] if any concerns or questions arise related to the biopsy. Recommend surgical and oncological consultation: Request for surgical consultation relayed to Al Pimple RN and Tanya Nones RN at [facility] by Electa Sniff RN on 08/13/2020. Pathology results reported by Electa Sniff RN on 08/15/2020. Electronically Signed   By: Nolon Nations M.D.   On: 08/15/2020 14:13   Result Date: 08/15/2020 CLINICAL DATA:  Patient presents for ultrasound-guided core biopsy of RIGHT axillary mass. EXAM: ULTRASOUND GUIDED RIGHT AXILLARY CORE NEEDLE BIOPSY COMPARISON:  Previous exam(s). PROCEDURE: I met with the patient and we discussed the procedure of ultrasound-guided biopsy, including benefits and alternatives. We discussed the high likelihood of a successful procedure. We discussed the risks of the procedure, including infection, bleeding, tissue injury, clip migration, and inadequate sampling. Informed written consent was given. The usual time-out protocol was performed immediately prior to the procedure. Lesion quadrant: RIGHT axilla Using sterile technique and 1% Lidocaine as local anesthetic, under direct ultrasound visualization, a 14 gauge spring-loaded device was used to perform biopsy of RIGHT axillary mass using a LATERAL to MEDIAL approach. At the conclusion of the procedure butterfly HydroMARK tissue marker clip was deployed into the biopsy cavity. Follow up 2 view mammogram was performed and dictated separately. IMPRESSION: Ultrasound guided biopsy of RIGHT axillary mass. No apparent complications. Electronically Signed: By:  Nolon Nations M.D. On: 08/11/2020 08:56    Assessment and plan- Patient is a 60 y.o. female with prior history of stage I ER positive right breast cancer in 2009 s/p surgery, adjuvant radiation therapy and 5 years of hormone therapy now presenting with a right axillary mass  Discussed with the patient that the biopsy findings from the right axillary mass was consistent with breast cancer although there was no definitive  lymph node tissue found in the sample.  It is unclear at this time if the axillary mass represents a metastases versus a breast primary tumor itself.  ER/PR and HER-2 status is currently pending  I would recommend getting a PET CT scan to complete staging work-up.  Also get bilateral diagnostic mammograms to see if there are any other primary breast masses.  If mammogram is negative I will proceed with a bilateral breast MRI.  Left hip pain: Await PET CT scan results we will proceed with left hip x-rays today.  We will discuss further management after all the studies are back and we have answers from ER/PR and HER-2 status   Thank you for this kind referral and the opportunity to participate in the care of this patient   Visit Diagnosis 1. Malignant neoplasm of right female breast, unspecified estrogen receptor status, unspecified site of breast ([place])   2. Chronic hip pain, left     Dr. Randa Evens, MD, MPH [facility] [place] [phone] 08/18/2020  8:34 AM

## 2020-08-19 ENCOUNTER — Other Ambulatory Visit: Payer: Self-pay | Admitting: Oncology

## 2020-08-19 ENCOUNTER — Ambulatory Visit: Payer: 59 | Admitting: Oncology

## 2020-08-19 ENCOUNTER — Other Ambulatory Visit: Payer: Self-pay | Admitting: Obstetrics and Gynecology

## 2020-08-19 DIAGNOSIS — C50911 Malignant neoplasm of unspecified site of right female breast: Secondary | ICD-10-CM

## 2020-08-19 NOTE — Progress Notes (Signed)
Referal to [place] for 2nd opinion in recurrent breast cancer with mets dx

## 2020-08-20 ENCOUNTER — Other Ambulatory Visit: Payer: Self-pay

## 2020-08-20 ENCOUNTER — Ambulatory Visit
Admission: RE | Admit: 2020-08-20 | Discharge: 2020-08-20 | Disposition: A | Discharge status: Home / Self Care | Payer: 59 | Source: Ambulatory Visit | Attending: Surgery | Admitting: Surgery

## 2020-08-20 DIAGNOSIS — Z853 Personal history of malignant neoplasm of breast: Secondary | ICD-10-CM | POA: Insufficient documentation

## 2020-08-20 DIAGNOSIS — C50911 Malignant neoplasm of unspecified site of right female breast: Secondary | ICD-10-CM | POA: Diagnosis not present

## 2020-08-20 DIAGNOSIS — R928 Other abnormal and inconclusive findings on diagnostic imaging of breast: Secondary | ICD-10-CM | POA: Diagnosis not present

## 2020-08-20 DIAGNOSIS — N6489 Other specified disorders of breast: Secondary | ICD-10-CM | POA: Diagnosis not present

## 2020-08-21 ENCOUNTER — Ambulatory Visit: Payer: 59 | Admitting: Oncology

## 2020-08-21 ENCOUNTER — Other Ambulatory Visit: Payer: 59

## 2020-08-21 ENCOUNTER — Inpatient Hospital Stay: Payer: 59

## 2020-08-21 ENCOUNTER — Other Ambulatory Visit: Payer: Self-pay | Admitting: Oncology

## 2020-08-21 ENCOUNTER — Telehealth: Payer: Self-pay | Admitting: Pharmacy Technician

## 2020-08-21 ENCOUNTER — Inpatient Hospital Stay (HOSPITAL_BASED_OUTPATIENT_CLINIC_OR_DEPARTMENT_OTHER): Payer: 59 | Admitting: Oncology

## 2020-08-21 ENCOUNTER — Inpatient Hospital Stay: Payer: 59 | Admitting: Pharmacist

## 2020-08-21 DIAGNOSIS — G893 Neoplasm related pain (acute) (chronic): Secondary | ICD-10-CM | POA: Diagnosis not present

## 2020-08-21 DIAGNOSIS — G8929 Other chronic pain: Secondary | ICD-10-CM

## 2020-08-21 DIAGNOSIS — Z79899 Other long term (current) drug therapy: Secondary | ICD-10-CM | POA: Diagnosis not present

## 2020-08-21 DIAGNOSIS — E119 Type 2 diabetes mellitus without complications: Secondary | ICD-10-CM | POA: Diagnosis not present

## 2020-08-21 DIAGNOSIS — C50911 Malignant neoplasm of unspecified site of right female breast: Secondary | ICD-10-CM

## 2020-08-21 DIAGNOSIS — Z7189 Other specified counseling: Secondary | ICD-10-CM

## 2020-08-21 DIAGNOSIS — Z923 Personal history of irradiation: Secondary | ICD-10-CM | POA: Diagnosis not present

## 2020-08-21 DIAGNOSIS — C7951 Secondary malignant neoplasm of bone: Secondary | ICD-10-CM | POA: Diagnosis not present

## 2020-08-21 DIAGNOSIS — C50919 Malignant neoplasm of unspecified site of unspecified female breast: Secondary | ICD-10-CM

## 2020-08-21 DIAGNOSIS — Z9221 Personal history of antineoplastic chemotherapy: Secondary | ICD-10-CM | POA: Diagnosis not present

## 2020-08-21 DIAGNOSIS — Z7984 Long term (current) use of oral hypoglycemic drugs: Secondary | ICD-10-CM | POA: Diagnosis not present

## 2020-08-21 DIAGNOSIS — C773 Secondary and unspecified malignant neoplasm of axilla and upper limb lymph nodes: Secondary | ICD-10-CM | POA: Diagnosis not present

## 2020-08-21 DIAGNOSIS — M25552 Pain in left hip: Secondary | ICD-10-CM | POA: Diagnosis not present

## 2020-08-21 LAB — CBC WITH DIFFERENTIAL/PLATELET
Abs Immature Granulocytes: 0.04 10*3/uL (ref 0.00–0.07)
Basophils Absolute: 0 10*3/uL (ref 0.0–0.1)
Basophils Relative: 0 %
Eosinophils Absolute: 0.2 10*3/uL (ref 0.0–0.5)
Eosinophils Relative: 2 %
HCT: 40.8 % (ref 36.0–46.0)
Hemoglobin: 12.8 g/dL (ref 12.0–15.0)
Immature Granulocytes: 0 %
Lymphocytes Relative: 22 %
Lymphs Abs: 2 10*3/uL (ref 0.7–4.0)
MCH: 24.3 pg — ABNORMAL LOW (ref 26.0–34.0)
MCHC: 31.4 g/dL (ref 30.0–36.0)
MCV: 77.4 fL — ABNORMAL LOW (ref 80.0–100.0)
Monocytes Absolute: 0.9 10*3/uL (ref 0.1–1.0)
Monocytes Relative: 10 %
Neutro Abs: 6.1 10*3/uL (ref 1.7–7.7)
Neutrophils Relative %: 66 %
Platelets: 272 10*3/uL (ref 150–400)
RBC: 5.27 MIL/uL — ABNORMAL HIGH (ref 3.87–5.11)
RDW: 19.3 % — ABNORMAL HIGH (ref 11.5–15.5)
WBC: 9.2 10*3/uL (ref 4.0–10.5)
nRBC: 0 % (ref 0.0–0.2)

## 2020-08-21 LAB — COMPREHENSIVE METABOLIC PANEL
ALT: 20 U/L (ref 0–44)
AST: 18 U/L (ref 15–41)
Albumin: 4.1 g/dL (ref 3.5–5.0)
Alkaline Phosphatase: 123 U/L (ref 38–126)
Anion gap: 10 (ref 5–15)
BUN: 22 mg/dL — ABNORMAL HIGH (ref 6–20)
CO2: 26 mmol/L (ref 22–32)
Calcium: 9.3 mg/dL (ref 8.9–10.3)
Chloride: 103 mmol/L (ref 98–111)
Creatinine, Ser: 0.99 mg/dL (ref 0.44–1.00)
GFR, Estimated: >60 mL/min (ref >60)
Glucose, Bld: 89 mg/dL (ref 70–99)
Potassium: 4.4 mmol/L (ref 3.5–5.1)
Sodium: 139 mmol/L (ref 135–145)
Total Bilirubin: 0.4 mg/dL (ref 0.3–1.2)
Total Protein: 7.8 g/dL (ref 6.5–8.1)

## 2020-08-21 MED ORDER — LETROZOLE 2.5 MG PO TABS: 2.5000 mg | ORAL_TABLET | Freq: Every day | ORAL | 4 refills | Status: DC

## 2020-08-21 NOTE — Progress Notes (Signed)
[place]  Telephone:([phone] Fax:([phone]  Patient Care Team: Derinda Late, MD as PCP - General (Family Medicine)   Name of the patient: Jacqueline Deleon  [id number]  August 18, 1960   Date of visit: 08/21/20  HPI: Patient is a 60 y.o. female with newly diagnosed ER/PR positive, HER2 negative breast cancer. Planned treatment with Ibrance (palbociclib) and letrozole.  Reason for Consult: Ibrance (palbociclib) and letrozole oral chemotherapy education.   PAST MEDICAL HISTORY: Past Medical History:  Diagnosis Date  . Allergic genetic state   . Arthritis   . Breast cancer ([place])    2009 infiltrating ductal cancer of right breast  . Breast mass 08/2006, 03/2009   left breast biopsy fibroadenoma (2008) right breast biopsy benign (2010)  . 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 Central serous retinopathy   . Chicken pox   . Depression   . Fatty liver   . Fatty liver   . Gastric polyps   . GERD (gastroesophageal reflux disease)   . Gestational diabetes    prediabetes out side of having baby  . Headache    migraines  . Heart murmur   . History of abnormal mammogram 08/2006, 01/2008, 03/2009  . Hypertension   . IBS (irritable bowel syndrome)   . Joint disorder    hx of left ankle tendonitis, bursitis, heel spur  . Obesity 2018   BMI 45  . Pelvic pain    adhesions and adenomyosis  . Personal history of radiation therapy   . Rosacea     PAST SURGICAL HISTORY:  Past Surgical History:  Procedure Laterality Date  . ABDOMINAL HYSTERECTOMY    . BREAST BIOPSY Left 08/2006   benign fibroadenoma  . BREAST BIOPSY Right 08/11/2020   [place] bx of LN, hydromarker, Invasive mammary carcinoma  . BREAST EXCISIONAL BIOPSY Right 02/26/2008   right breast invasive mam ca with rad partial mastectomy   . BREAST SURGERY Right    x2/ Dr Tamala Julian  . CARDIAC CATHETERIZATION  2006  . CESAREAN SECTION  04/02/1998  .  CHOLECYSTECTOMY  04/2010   Dr. Smith/ laprascopic surgery  . COLONOSCOPY  04/04/2000  . COLONOSCOPY WITH PROPOFOL N/A 02/12/2019   Procedure: COLONOSCOPY WITH PROPOFOL;  Surgeon: Lollie Sails, MD;  Location[facility] ENDOSCOPY;  Service: Endoscopy;  Laterality: N/A;  . ESOPHAGOGASTRODUODENOSCOPY (EGD) WITH PROPOFOL N/A 05/01/2015   Procedure: ESOPHAGOGASTRODUODENOSCOPY (EGD) WITH PROPOFOL;  Surgeon: Hulen Luster, MD;  Location: [facility] ENDOSCOPY;  Service: Gastroenterology;  Laterality: N/A;  . ESOPHAGOGASTRODUODENOSCOPY (EGD) WITH PROPOFOL N/A 02/12/2019   Procedure: ESOPHAGOGASTRODUODENOSCOPY (EGD) WITH PROPOFOL;  Surgeon: Lollie Sails, MD;  Location: [facility] ENDOSCOPY;  Service: Endoscopy;  Laterality: N/A;  . EYE SURGERY  08/2012   laser surgery on retina/ DR Appenzeller  . FINGER SURGERY     repair of tendon in right index, Dr. Francesco Sor in [place]  . FOOT SURGERY     Dr. Milinda Pointer  . IRRIGATION AND DEBRIDEMENT SEBACEOUS CYST     right upper back  . LAPAROSCOPIC SUPRACERVICAL HYSTERECTOMY  06/2007   Dr Kincius/ adhesions/CPP/adenomyosis  . MASTECTOMY PARTIAL / LUMPECTOMY Right 01/2008   with sentinal lymph node biopsy/ infiltrating ductal cancer  . REPAIR PERONEAL TENDONS ANKLE  10/2019   with tarsal exostectomy and sural neuroma excision on right   . SKIN CANCER EXCISION     back and calves  . WISDOM TOOTH EXTRACTION  1991    HEMATOLOGY/ONCOLOGY HISTORY:  Oncology History   No history exists.    ALLERGIES:  is allergic to kiwi extract, codeine, grapeseed extract [nutritional supplements], amoxicillin, erythromycin, sulfa antibiotics, and tape.  MEDICATIONS:  Current Outpatient Medications  Medication Sig Dispense Refill  . acetaminophen (TYLENOL) 325 MG tablet Take 650 mg by mouth every 6 (six) hours as needed for mild pain or headache.    Marland Kitchen buPROPion (WELLBUTRIN XL) 300 MG 24 hr tablet Take 300 mg by mouth at bedtime.    . citalopram (CELEXA) 40 MG tablet Take 40 mg by mouth  at bedtime.    . clotrimazole (LOTRIMIN) 1 % cream Apply 1 application topically daily. groin    . diltiazem (CARDIZEM CD) 120 MG 24 hr capsule Take 1 capsule (120 mg total) by mouth daily. 30 capsule 11  . diphenhydrAMINE (BENADRYL) 25 MG tablet Take 25 mg by mouth at bedtime as needed for sleep.    Marland Kitchen ketotifen (ZADITOR) 0.025 % ophthalmic solution Place 1 drop into both eyes daily.    Marland Kitchen letrozole (FEMARA) 2.5 MG tablet Take 1 tablet (2.5 mg total) by mouth daily. 90 tablet 4  . lisinopril (PRINIVIL,ZESTRIL) 10 MG tablet Take 10 mg by mouth at bedtime.    . Meloxicam 7.5 MG TBDP Take 7.5 mg by mouth daily as needed. 30 tablet 0  . metFORMIN (GLUCOPHAGE-XR) 500 MG 24 hr tablet Take 500 mg by mouth in the morning and at bedtime.    . Multiple Vitamin (MULTIVITAMIN) tablet Take 1 tablet by mouth daily.    Marland Kitchen omeprazole (PRILOSEC) 20 MG capsule Take 20 mg by mouth daily.     No current facility-administered medications for this visit.    VITAL SIGNS: There were no vitals taken for this visit. There were no vitals filed for this visit.  Estimated body mass index is 46.06 kg/m as calculated from the following:   Height as of 08/15/20: $RemoveBe'5\' 3"'aRvRVWmhO$  (1.6 m).   Weight as of 08/15/20: 117.9 kg (260 lb).  LABS: CBC:    Component Value Date/Time   WBC 5.7 08/08/2020 0550   HGB 11.7 (L) 08/08/2020 0550   HGB 11.9 03/06/2020 0811   HCT 37.6 08/08/2020 0550   HCT 38.6 03/06/2020 0811   PLT 292 08/08/2020 0550   PLT 253 03/06/2020 0811   MCV 78.7 (L) 08/08/2020 0550   MCV 80 03/06/2020 0811   NEUTROABS 4.0 08/07/2020 0544   NEUTROABS 4.7 03/06/2020 0811   LYMPHSABS 1.6 08/07/2020 0544   LYMPHSABS 1.6 03/06/2020 0811   MONOABS 0.7 08/07/2020 0544   EOSABS 0.3 08/07/2020 0544   EOSABS 0.1 03/06/2020 0811   BASOSABS 0.0 08/07/2020 0544   BASOSABS 0.0 03/06/2020 0811   Comprehensive Metabolic Panel:    Component Value Date/Time   NA 141 08/08/2020 0550   NA 141 03/06/2020 0811   K 4.7 08/08/2020  0550   CL 103 08/08/2020 0550   CO2 28 08/08/2020 0550   BUN 19 08/08/2020 0550   BUN 18 03/06/2020 0811   CREATININE 0.90 08/08/2020 0550   GLUCOSE 98 08/08/2020 0550   CALCIUM 9.0 08/08/2020 0550   AST 24 08/07/2020 0544   ALT 19 08/07/2020 0544   ALKPHOS 114 08/07/2020 0544   BILITOT 0.6 08/07/2020 0544   BILITOT <0.2 03/06/2020 0811   PROT 7.6 08/07/2020 0544   PROT 6.6 03/06/2020 0811   ALBUMIN 3.8 08/07/2020 0544   ALBUMIN 4.2 03/06/2020 0811    RADIOGRAPHIC STUDIES: CT Angio Chest PE W and/or Wo Contrast  Result  Date: 07/22/2020 CLINICAL DATA:  PE suspected, high prob Cough and shortness of breath since COVID diagnosis 8 days ago EXAM: CT ANGIOGRAPHY CHEST WITH CONTRAST TECHNIQUE: Multidetector CT imaging of the chest was performed using the standard protocol during bolus administration of intravenous contrast. Multiplanar CT image reconstructions and MIPs were obtained to evaluate the vascular anatomy. CONTRAST:  64mL OMNIPAQUE IOHEXOL 350 MG/ML SOLN COMPARISON:  Radiograph earlier today. FINDINGS: Cardiovascular: There are no filling defects within the pulmonary arteries to suggest pulmonary embolus. Normal caliber thoracic aorta. No dissection or acute aortic findings. Upper normal heart size. No pericardial effusion. Mediastinum/Nodes: There is an abnormal right lobulated lymph node/axillary mass measuring 2.5 cm, series 4, image 24. Prominent adjacent axillary lymph nodes. There is mild adjacent tissue edema. No obvious breast mass. No mediastinal or hilar adenopathy. No visualized thyroid nodule. No esophageal wall thickening, tiny hiatal hernia. Lungs/Pleura: 2 smoothly marginated perifissural nodules adjacent to the right minor and major fissure are most consistent with intrapulmonary lymph nodes. No focal airspace disease or ground-glass opacity typically seen with COVID pneumonia. No findings of pulmonary edema. No pleural fluid. Upper Abdomen: Prominent liver with steatosis  partially included. No acute upper abdominal findings. Musculoskeletal: Thoracic spondylosis. There are no acute or suspicious osseous abnormalities. Review of the MIP images confirms the above findings. IMPRESSION: 1. No pulmonary embolus. 2. No evidence of pneumonia. 3. Abnormal right axillary lymph node/axillary mass measuring 2.5 cm with mild adjacent edema. Prominent adjacent axillary lymph nodes. Patient has history of right breast cancer. A right axillary ultrasound in September 2021 demonstrated no focal abnormality. Recommend repeat targeted axillary ultrasound at the breast center. 4. Incidental note of hepatic steatosis. Electronically Signed   By: Keith Rake M.D.   On: 07/22/2020 17:53   NM PET Image Initial (PI) Skull Base To Thigh  Result Date: 08/18/2020 CLINICAL DATA:  Initial treatment strategy for right breast cancer. Biopsy-proven invasive mammary carcinoma on right axillary mass biopsy 08/11/2020. History of stage I right breast cancer in 2009 status post conservation therapy. Chronic left hip pain for months with no reported injury. EXAM: NUCLEAR MEDICINE PET SKULL BASE TO THIGH TECHNIQUE: 13.8 mCi F-18 FDG was injected intravenously. Full-ring PET imaging was performed from the skull base to thigh after the radiotracer. CT data was obtained and used for attenuation correction and anatomic localization. Fasting blood glucose: 92 mg/dl COMPARISON:  07/22/2020 chest CT angiogram. 08/19/2019 MRI abdomen. 05/22/2012 CT abdomen/pelvis. 05/21/2011 PET-CT. FINDINGS: Mediastinal blood pool activity: SUV max 2.5 Liver activity: SUV max NA NECK: No hypermetabolic lymph nodes in the neck. Incidental CT findings: Small mucous retention cyst versus polyp in the inferior left maxillary sinus. CHEST: Hypermetabolic poorly marginated solid 2.5 x 2.0 cm right axillary mass with max SUV 5.6 (series 3/image 75). No enlarged or hypermetabolic mediastinal or hilar lymph nodes. No enlarged or hypermetabolic  left axillary lymph nodes. No hypermetabolic pulmonary findings. Incidental CT findings: Anterior right lower lobe 4 mm solid pulmonary nodule (series 3/image 92), stable since 2012 PET-CT and considered benign. Posterior right middle lobe 6 mm solid pulmonary nodule (series 3/image 102) and basilar left lower lobe 7 mm solid pulmonary nodule (series 3/image 113), both stable since recent 07/22/2020 chest CT angiogram study, not definitely seen on 2012 PET-CT, below PET resolution. Postsurgical changes from remote lumpectomy in the right breast. ABDOMEN/PELVIS: No abnormal hypermetabolic activity within the pancreas, adrenal glands, or spleen. No hypermetabolic lymph nodes in the abdomen or pelvis. Subtle small focus of hypermetabolism in inferior  right liver with max SUV 6.0, without definite CT correlate, equivocal for liver metastasis. Incidental CT findings: Cholecystectomy. 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 Mild sigmoid diverticulosis. SKELETON: Multiple hypermetabolic faintly lytic osseous lesions throughout lumbar spine and bilateral pelvic girdle, with representative lesions as follows: -supra-acetabular right iliac bone 1.9 cm lesion with max SUV 6.7 (series 3/image 215) -anterior superior left acetabular 1.7 cm lesion with max SUV 7.8 (series 3/image 222) -posterior right L1 vertebral 1.2 cm lesion with max SUV 5.6 (series 3/image 149) Incidental CT findings: none IMPRESSION: 1. Hypermetabolic poorly marginated solid 2.5 cm right axillary mass compatible with known biopsy-proven malignancy (either right axillary tail primary neoplasm or infiltrative right axillary lymph node metastasis). 2. 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 3. Subtle small focus of hypermetabolism in the inferior right liver without definite CT correlate, equivocal for liver metastasis. MRI abdomen without with IV contrast may be  obtained for further evaluation if clinically warranted. 4. Solid 6 mm right middle lobe and 7 mm left lower lobe pulmonary nodules, below PET resolution, stable since recent 07/22/2020 chest CT angiogram study, not definitely seen on 2012 PET-CT, warranting continued chest CT surveillance in 3-6 months. 5. Chronic findings include: Tiny upper left renal angiomyolipoma. Mild sigmoid diverticulosis. Electronically Signed   By: Ilona Sorrel M.D.   On: 08/18/2020 16:14   DG Chest Port 1 View  Result Date: 08/07/2020 CLINICAL DATA:  60 year old female who woke at 0400 hours with palpitations and shortness of breath. Positive for COVID-19. 07/14/2020. EXAM: PORTABLE CHEST 1 VIEW COMPARISON:  CTA chest 07/22/2020 and earlier. FINDINGS: Portable AP upright view at 0549 hours. Stable lung volumes and mediastinal contours, within normal limits. Visualized tracheal air column is within normal limits. Allowing for portable technique the lungs are clear. No pneumothorax. No acute osseous abnormality identified. Paucity of bowel gas in the upper abdomen. IMPRESSION: Negative portable chest. Electronically Signed   By: Genevie Ann M.D.   On: 08/07/2020 06:02   US BREAST LTD UNI RIGHT INC AXILLA  Result Date: 08/20/2020 CLINICAL DATA:  60 year old female with biopsy proven invasive mammary carcinoma involving a right axillary mass. Patient has a history of remote stage I breast cancer, treated in 2009. History of remote excisional left breast biopsy for a fibroadenoma. EXAM: DIGITAL DIAGNOSTIC BILATERAL MAMMOGRAM WITH TOMOSYNTHESIS AND CAD; ULTRASOUND RIGHT BREAST LIMITED TECHNIQUE: Bilateral digital diagnostic mammography and breast tomosynthesis was performed. The images were evaluated with computer-aided detection.; Targeted ultrasound examination of the right breast was performed. COMPARISON:  Previous exam(s). ACR Breast Density Category b: There are scattered areas of fibroglandular density. FINDINGS: Stable postsurgical  changes noted in the bilateral breasts. No new or suspicious mammographic findings in either breast. Targeted ultrasound is performed, read demonstrating an irregular, hypoechoic mass in the deep right axilla. It measures 2.6 x 2.0 x 1.9 cm (previously 2.8 x 1.8 x 1.6 cm). IMPRESSION: 1. No mammographic evidence of malignancy in either breast. 2. Grossly stable appearance of a right axillary mass, consistent with the patient's biopsy-proven site of malignancy. RECOMMENDATION: Per clinical treatment plan. I have discussed the findings and recommendations with the patient. If applicable, a reminder letter will be sent to the patient regarding the next appointment. BI-RADS CATEGORY  6: Known biopsy-proven malignancy. Electronically Signed   By: Kristopher Oppenheim M.D.   On: 08/20/2020 12:06   US BREAST LTD UNI RIGHT INC AXILLA  Result Date: 08/06/2020 CLINICAL DATA:  61 year old female with history of right breast cancer in 2009 status post lumpectomy. Patient presents for evaluation of a right axillary mass identified on recent CT scan. EXAM: ULTRASOUND OF THE RIGHT BREAST COMPARISON:  Previous exam(s). FINDINGS: On physical exam, I feel a firm mass in the low right axilla. Targeted ultrasound is performed in the right axilla demonstrating an irregular hypoechoic mass measuring 2.8 x 1.6 x 1.8 cm. Targeted ultrasound of regional axillary lymph nodes demonstrates no additional abnormal lymph node. IMPRESSION: Suspicious irregular mass in the right axilla measuring 2.8 cm, possibly an abnormal lymph node versus primary malignancy. No additional abnormal lymph nodes are identified sonographically in the right axilla though there do appear to be additional smaller adjacent suspicious masses on the patient's CT scan. RECOMMENDATION: Ultrasound-guided core needle biopsy of the right axillary mass. I have discussed the findings and recommendations with the patient who agrees to proceed with biopsy. The patient will be  contacted by our scheduler to make the biopsy appointment BI-RADS CATEGORY  4: Suspicious. Electronically Signed   By: Audie Pinto M.D.   On: 08/06/2020 13:44   MM DIAG BREAST TOMO BILATERAL  Result Date: 08/20/2020 CLINICAL DATA:  60 year old female with biopsy proven invasive mammary carcinoma involving a right axillary mass. Patient has a history of remote stage I breast cancer, treated in 2009. History of remote excisional left breast biopsy for a fibroadenoma. EXAM: DIGITAL DIAGNOSTIC BILATERAL MAMMOGRAM WITH TOMOSYNTHESIS AND CAD; ULTRASOUND RIGHT BREAST LIMITED TECHNIQUE: Bilateral digital diagnostic mammography and breast tomosynthesis was performed. The images were evaluated with computer-aided detection.; Targeted ultrasound examination of the right breast was performed. COMPARISON:  Previous exam(s). ACR Breast Density Category b: There are scattered areas of fibroglandular density. FINDINGS: Stable postsurgical changes noted in the bilateral breasts. No new or suspicious mammographic findings in either breast. Targeted ultrasound is performed, read demonstrating an irregular, hypoechoic mass in the deep right axilla. It measures 2.6 x 2.0 x 1.9 cm (previously 2.8 x 1.8 x 1.6 cm). IMPRESSION: 1. No mammographic evidence of malignancy in either breast. 2. Grossly stable appearance of a right axillary mass, consistent with the patient's biopsy-proven site of malignancy. RECOMMENDATION: Per clinical treatment plan. I have discussed the findings and recommendations with the patient. If applicable, a reminder letter will be sent to the patient regarding the next appointment. BI-RADS CATEGORY  6: Known biopsy-proven malignancy. Electronically Signed   By: Kristopher Oppenheim M.D.   On: 08/20/2020 12:06   ECHOCARDIOGRAM COMPLETE  Result Date: 08/08/2020    ECHOCARDIOGRAM REPORT   Patient Name:   Dahiana KARLITA LICHTMAN Date of Exam: 08/08/2020 Medical Rec #:  [id number]      Height:       63.0 in Accession #:     [id number]     Weight:       257.1 lb Date of Birth:  12-03-1960      BSA:          2.151 m Patient Age:    60 years       BP:           123/75 mmHg Patient Gender: F              HR:           74 bpm. Exam Location:  [facility] Procedure: 2D Echo, Color Doppler and Cardiac Doppler Indications:     I48.91 Atrial fibrillation  History:         Patient has no prior history of Echocardiogram  examinations.                  Risk Factors:Hypertension. Pt tested positive for COVID-19 on                  07/15/20.  Sonographer:     Charmayne Sheer RDCS (AE) Referring Phys:  [id number] DWAYNE D CALLWOOD Diagnosing Phys: Yolonda Kida MD  Sonographer Comments: Suboptimal parasternal window and suboptimal subcostal window. Image acquisition challenging due to patient body habitus. IMPRESSIONS  1. Left ventricular ejection fraction, by estimation, is 60 to 65%. The left ventricle has normal function. The left ventricle has no regional wall motion abnormalities. Left ventricular diastolic parameters were normal.  2. Right ventricular systolic function is normal. The right ventricular size is normal.  3. The mitral valve is normal in structure. No evidence of mitral valve regurgitation.  4. The aortic valve is grossly normal. Aortic valve regurgitation is not visualized. FINDINGS  Left Ventricle: Left ventricular ejection fraction, by estimation, is 60 to 65%. The left ventricle has normal function. The left ventricle has no regional wall motion abnormalities. The left ventricular internal cavity size was normal in size. There is  no left ventricular hypertrophy. Left ventricular diastolic parameters were normal. Right Ventricle: The right ventricular size is normal. No increase in right ventricular wall thickness. Right ventricular systolic function is normal. Left Atrium: Left atrial size was normal in size. Right Atrium: Right atrial size was normal in size. Pericardium: There is no evidence of pericardial effusion. Mitral Valve: The  mitral valve is normal in structure. No evidence of mitral valve regurgitation. MV peak gradient, 3.1 mmHg. The mean mitral valve gradient is 1.0 mmHg. Tricuspid Valve: The tricuspid valve is normal in structure. Tricuspid valve regurgitation is not demonstrated. Aortic Valve: The aortic valve is grossly normal. Aortic valve regurgitation is not visualized. Aortic valve mean gradient measures 7.0 mmHg. Aortic valve peak gradient measures 15.8 mmHg. Aortic valve area, by VTI measures 2.09 cm. Pulmonic Valve: The pulmonic valve was normal in structure. Pulmonic valve regurgitation is not visualized. Aorta: The ascending aorta was not well visualized. IAS/Shunts: No atrial level shunt detected by color flow Doppler.  LEFT VENTRICLE PLAX 2D LVIDd:         4.90 cm  Diastology LVIDs:         3.40 cm  LV e' medial:    10.90 cm/s LV PW:         1.10 cm  LV E/e' medial:  9.2 LV IVS:        0.80 cm  LV e' lateral:   10.00 cm/s LVOT diam:     1.70 cm  LV E/e' lateral: 10.0 LV SV:         66 LV SV Index:   31 LVOT Area:     2.27 cm  RIGHT VENTRICLE RV Basal diam:  3.30 cm TAPSE (M-mode): 2.3 cm LEFT ATRIUM             Index      RIGHT ATRIUM           Index LA diam:        3.50 cm 1.63 cm/m RA Area:     12.50 cm LA Vol (A2C):   19.4 ml 9.02 ml/m RA Volume:   31.80 ml  14.78 ml/m LA Vol (A4C):   17.9 ml 8.32 ml/m LA Biplane Vol: 19.0 ml 8.83 ml/m  AORTIC VALVE  PULMONIC VALVE AV Area (Vmax):    1.68 cm     PV Vmax:       1.15 m/s AV Area (Vmean):   1.86 cm     PV Vmean:      81.600 cm/s AV Area (VTI):     2.09 cm     PV VTI:        0.222 m AV Vmax:           199.00 cm/s  PV Peak grad:  5.3 mmHg AV Vmean:          123.000 cm/s PV Mean grad:  3.0 mmHg AV VTI:            0.315 m AV Peak Grad:      15.8 mmHg AV Mean Grad:      7.0 mmHg LVOT Vmax:         147.00 cm/s LVOT Vmean:        101.000 cm/s LVOT VTI:          0.290 m LVOT/AV VTI ratio: 0.92  AORTA Ao Root diam: 2.50 cm MITRAL VALVE MV Area (PHT):  4.08 cm     SHUNTS MV Area VTI:   2.71 cm     Systemic VTI:  0.29 m MV Peak grad:  3.1 mmHg     Systemic Diam: 1.70 cm MV Mean grad:  1.0 mmHg MV Vmax:       0.88 m/s MV Vmean:      57.0 cm/s MV Decel Time: 186 msec MV E velocity: 100.00 cm/s MV A velocity: 103.00 cm/s MV E/A ratio:  0.97 Dwayne D Callwood MD Electronically signed by Yolonda Kida MD Signature Date/Time: 08/08/2020/1:06:57 PM    Final    MM CLIP PLACEMENT RIGHT  Result Date: 08/11/2020 CLINICAL DATA:  Status post ultrasound-guided core biopsy of RIGHT axillary mass. EXAM: DIAGNOSTIC RIGHT MAMMOGRAM POST ULTRASOUND BIOPSY COMPARISON:  Previous exam(s). FINDINGS: Mammographic images were obtained following ultrasound guided biopsy of RIGHT axillary mass and placement of a butterfly HydroMARK shaped clip. The biopsy marking clip is in expected position at the site of biopsy. IMPRESSION: Appropriate positioning of the butterfly HydroMARK shaped biopsy marking clip at the site of biopsy in the location. Final Assessment: Post Procedure Mammograms for Marker Placement Electronically Signed   By: Nolon Nations M.D.   On: 08/11/2020 08:58   DG HIP UNILAT WITH PELVIS 2-3 VIEWS LEFT  Result Date: 08/16/2020 CLINICAL DATA:  Chronic pain in the left hip. EXAM: DG HIP (WITH OR WITHOUT PELVIS) 2-3V LEFT COMPARISON:  None. FINDINGS: There is no evidence of hip fracture or dislocation. Mild osteoarthritic changes of bilateral hips with subchondral sclerosis of the acetabula and minimal osteophytosis. Soft tissues are normal. IMPRESSION: 1. No acute fracture or dislocation identified about the left hip. 2. Mild osteoarthritic changes of bilateral hips. Electronically Signed   By: Fidela Salisbury M.D.   On: 08/16/2020 16:17   [place] RT BREAST BX W LOC DEV 1ST LESION IMG BX SPEC US GUIDE  Addendum Date: 08/15/2020   ADDENDUM REPORT: 08/15/2020 14:13 ADDENDUM: PATHOLOGY revealed: A. SOFT TISSUE MASS, RIGHT AXILLA; ULTRASOUND-GUIDED CORE BIOPSY: -  INVASIVE MAMMARY CARCINOMA. Grade 3. Comment: Invasive carcinoma involves nearly the full length of the cores, and measures 16 mm in this sample. No carcinoma in situ is identified. The carcinoma shows absence of tubule/gland formation and moderate nuclear pleomorphism. The mitotic rate is high, 18 per 10 high power fields (field diameter 0.55 mm). Pathology results  are CONCORDANT with imaging findings, per Dr. Nolon Nations. Pathology results and recommendations below were discussed with patient by telephone on 08/13/2020. Patient reported biopsy site within normal limits with slight tenderness at the site. Post biopsy care instructions were reviewed, questions were answered and my direct phone number was provided to patient. Patient was instructed to call [facility] if any concerns or questions arise related to the biopsy. Recommend surgical and oncological consultation: Request for surgical consultation relayed to Al Pimple RN and Tanya Nones RN at [facility] by Electa Sniff RN on 08/13/2020. Pathology results reported by Electa Sniff RN on 08/15/2020. Electronically Signed   By: Nolon Nations M.D.   On: 08/15/2020 14:13   Result Date: 08/15/2020 CLINICAL DATA:  Patient presents for ultrasound-guided core biopsy of RIGHT axillary mass. EXAM: ULTRASOUND GUIDED RIGHT AXILLARY CORE NEEDLE BIOPSY COMPARISON:  Previous exam(s). PROCEDURE: I met with the patient and we discussed the procedure of ultrasound-guided biopsy, including benefits and alternatives. We discussed the high likelihood of a successful procedure. We discussed the risks of the procedure, including infection, bleeding, tissue injury, clip migration, and inadequate sampling. Informed written consent was given. The usual time-out protocol was performed immediately prior to the procedure. Lesion quadrant: RIGHT axilla Using sterile technique and 1% Lidocaine as local anesthetic, under direct ultrasound visualization, a  14 gauge spring-loaded device was used to perform biopsy of RIGHT axillary mass using a LATERAL to MEDIAL approach. At the conclusion of the procedure butterfly HydroMARK tissue marker clip was deployed into the biopsy cavity. Follow up 2 view mammogram was performed and dictated separately. IMPRESSION: Ultrasound guided biopsy of RIGHT axillary mass. No apparent complications. Electronically Signed: By: Nolon Nations M.D. On: 08/11/2020 08:56     Assessment and Plan-  Start letrozole as soon as she is able to pick up the medication from her pharmacy, today or tomorrow. Start Ibrance once she has medication in hand (pending insurance approval). Two week clinic follow-up will be scheduled based on Ibrance start date.   Patient Education I spoke with patient and her husband for overview of new oral chemotherapy medication: Ibrance for the treatment of newly diagnosed ER/PR positive, HER2 negative breast cancer, planned duration until disease progression or unacceptable drug toxicity.   Counseled patient on administration, dosing, side effects, monitoring, drug-food interactions, safe handling, storage, and disposal. Patient will take: Ibrance: Take 1 tablet by mouth daily. Take fore 21 days, then hold for 7 days. Repeat every 28 days. Letrozole: Take 1 tablet (2.5 mg total) by mouth daily  Side effects include but not limited to:  Ibrance: decreased wbc, N/V, fatigue, change in liver function, hair thinning Letrozole: hot flashes, muscle pain, elevated cholesterol  Reviewed with patient importance of keeping a medication schedule and plan for any missed doses.  After discussion with patient no patient barriers to medication adherence identified.   Mrs. Hottle voiced understanding and appreciation. All questions answered. Medication handouts provided.  Provided patient with [facility] phone number. Patient knows to call the office with questions or concerns. Oral  Chemotherapy Navigation Clinic will continue to follow.  Medication Access Issues: will begin process of insurance approval, patient has commerical insurance and will be able to use copay card to bring down the cost of her Ibrance.  Patient expressed understanding and was in agreement with this plan. She also understands that She can call clinic at any time with any questions, concerns, or complaints.   Thank you for  allowing me to participate in the care of this very pleasant patient.   Time Total: 25 mins  Visit consisted of counseling and education on dealing with issues of symptom management in the setting of serious and potentially life-threatening illness.Greater than 50%  of this time was spent counseling and coordinating care related to the above assessment and plan.  Signed by: Darl Pikes, PharmD, BCPS, Salley Slaughter, CPP Hematology/Oncology Clinical Pharmacist Practitioner [facility] [phone]  08/21/2020 4:38 PM

## 2020-08-21 NOTE — Telephone Encounter (Signed)
Oral Oncology Patient Advocate Encounter   Received notification from [employer] that prior authorization for Jacqueline Deleon is required.   PA submitted on CoverMyMeds Key [id number] Status is pending   Oral Oncology Clinic will continue to follow.  [facility] Phone [phone] Fax [phone] 08/22/2020 10:18 AM

## 2020-08-22 ENCOUNTER — Ambulatory Visit: Payer: Self-pay | Admitting: Surgery

## 2020-08-22 ENCOUNTER — Encounter: Payer: Self-pay | Admitting: Surgery

## 2020-08-22 ENCOUNTER — Telehealth: Payer: Self-pay | Admitting: Pharmacist

## 2020-08-22 ENCOUNTER — Other Ambulatory Visit: Payer: Self-pay | Admitting: Oncology

## 2020-08-22 ENCOUNTER — Other Ambulatory Visit: Payer: Self-pay | Admitting: *Deleted

## 2020-08-22 ENCOUNTER — Encounter: Payer: Self-pay | Admitting: Radiation Oncology

## 2020-08-22 ENCOUNTER — Encounter: Payer: Self-pay | Admitting: *Deleted

## 2020-08-22 DIAGNOSIS — C50911 Malignant neoplasm of unspecified site of right female breast: Secondary | ICD-10-CM

## 2020-08-22 LAB — CANCER ANTIGEN 15-3: CA 15-3: 24.5 U/mL (ref 0.0–25.0)

## 2020-08-22 LAB — CANCER ANTIGEN 27.29: CA 27.29: 26.4 U/mL (ref 0.0–38.6)

## 2020-08-22 MED ORDER — PALBOCICLIB 125 MG PO TABS: 125.0000 mg | ORAL_TABLET | Freq: Every day | ORAL | 5 refills | Status: DC

## 2020-08-22 MED ORDER — PALBOCICLIB 125 MG PO CAPS: 125.0000 mg | ORAL_CAPSULE | Freq: Every day | ORAL | 5 refills | Status: DC

## 2020-08-22 MED ORDER — OXYCODONE HCL 5 MG PO TABS: 5.0000 mg | ORAL_TABLET | Freq: Four times a day (QID) | ORAL | 0 refills | Status: DC | PRN

## 2020-08-22 MED FILL — IBRANCE 125 MG TABS: 125 | 28 days supply | Qty: 21 | Fill #0

## 2020-08-22 NOTE — Telephone Encounter (Signed)
Oral Oncology Patient Advocate Encounter  Prior Authorization for Jacqueline Deleon has been approved.    PA# [id number] Effective dates: 08/22/20 through 08/21/21.   The authorization is effective for a maximum of 12 fills.  Patients co-pay is $0.00  Oral Oncology Clinic will continue to follow.   [facility] Phone [phone] Fax [phone] 08/22/2020 10:41 AM

## 2020-08-22 NOTE — Telephone Encounter (Signed)
Oral Oncology Pharmacist Encounter  Received new prescription for Ibrance for the treatment of newly diagnosed ER/PR positive, HER2 negative breast cancer, planned duration until disease progression or unacceptable drug toxicity.   CMP/CBC from 08/21/20 assessed, no relevant lab abnormalities. Prescription dose and frequency assessed.   Current medication list in Epic reviewed, one potential DDIs with palbociclib identified: -Omeprazole: Proton Pump Inhibitors (PPI) may diminish the therapeutic effect of palbociclib, varying information on the clinical impact. Recommend evaluating the need for a PPI/acid suppression. If acid suppression is needed, attempt switching to a H2 antagonist (eg, famotidine) or calcium carbonate if possible.  Evaluated chart and no patient barriers to medication adherence identified.   Prescription has been e-scribed to the [facility] for benefits analysis and approval.  Oral Oncology Clinic will continue to follow for insurance authorization, copayment issues, initial counseling and start date.  Darl Pikes, PharmD, BCPS, BCOP, CPP Hematology/Oncology Clinical Pharmacist Practitioner [facility] [phone]  08/22/2020 11:13 AM

## 2020-08-23 ENCOUNTER — Encounter: Payer: Self-pay | Admitting: Oncology

## 2020-08-23 DIAGNOSIS — C50919 Malignant neoplasm of unspecified site of unspecified female breast: Secondary | ICD-10-CM | POA: Insufficient documentation

## 2020-08-23 DIAGNOSIS — Z7189 Other specified counseling: Secondary | ICD-10-CM | POA: Insufficient documentation

## 2020-08-23 NOTE — Progress Notes (Signed)
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  It was a 1.4 cm tumor with negative lymph nodes grade 1 and patient went on to get lumpectomy followed by adjuvant radiation therapy.  Oncotype DX score was 6 and she did not require adjuvant chemotherapy.  She was on tamoxifen for 5 years.  She had BRCA testing done at that time which was negative.  Patient felt a lump in her right axilla in September 2021.  She underwent ultrasound of bilateral breasts which did not pick up any axillary mass.  A prior screening mammogram in September 2021 was unremarkable.  She was then admitted for Covid pneumonia and underwent CT angio chest on 07/22/2020 which incidentally picked up an axillary mass measuring 2.5 cm.  No obvious breast mass.  Axillary mass was biopsied and was consistent with high-grade invasive mammary carcinoma.  IHC was positive for GATA3 with diffuse strong nuclear staining.  There was no lymph node architecture identified in the sample and it could not be determined if it is a primary tumor within the axillary breast tissue or metastases.  ER strongly positive greater than 90%, PR 1 to 10% positive and HER-2 negative  PET CT scan showed hypermetabolic poorly marginated solid right axillary mass 2.5 x 2 cm with an SUV of 5.6.  No enlarged hypermetabolic mediastinal or hilar lymph nodes no enlarged left axillary lymph nodes.  Subcentimeter lung nodules below PET  resolution.  Small hypermetabolism in the right liver with an SUV of 6 without CT correlate equivocal for liver metastases.  Multiple hypermetabolic faintly lytic osseous lesions throughout the lumbar spine and bilateral pelvic girdle with representative supra-acetabular right iliac bone 1.9 cm lesion, anterior superior left acetabular 1.7 cm lesion and L1 1.2 cm lesion.  Interval history-patient reports left hip pain which is not currently well controlled with meloxicam especially at night when she finds it difficult to sleep.  Denies other complaints at this time  ECOG PS- 1 Pain scale- 4   Review of systems- Review of Systems  Constitutional: Negative for chills, fever, malaise/fatigue and weight loss.  HENT: Negative for congestion, ear discharge and nosebleeds.   Eyes: Negative for blurred vision.  Respiratory: Negative for cough, hemoptysis, sputum production, shortness of breath and wheezing.   Cardiovascular: Negative for chest pain, palpitations, orthopnea and claudication.  Gastrointestinal: Negative for abdominal pain, blood in stool, constipation, diarrhea, heartburn, melena, nausea and vomiting.  Genitourinary: Negative for dysuria, flank pain, frequency, hematuria and urgency.  Musculoskeletal: Negative for back pain, joint pain and myalgias.       Left hip pain  Skin: Negative for rash.  Neurological: Negative for dizziness, tingling, focal weakness, seizures, weakness and headaches.  Endo/Heme/Allergies: Does not bruise/bleed easily.  Psychiatric/Behavioral: Negative for depression and suicidal ideas. The patient does not have insomnia.       Allergies  Allergen Reactions  . Kiwi Extract Itching and Swelling    The real kiwi fruit  . Codeine Other (See Comments)  Felt funny.  Lorenza Chick Extract [Nutritional Supplements] Itching and Swelling    Raw Grapes only if cooked it is ok  . Amoxicillin Rash  . Erythromycin Rash  . Sulfa Antibiotics Rash  . Tape Rash     adhesives     Past Medical History:  Diagnosis Date  . Allergic genetic state   . Arthritis   . Breast cancer ([place])    2009 infiltrating ductal cancer of right breast  . Breast mass 08/2006, 03/2009   left breast biopsy fibroadenoma (2008) right breast biopsy benign (2010)  . 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 Central serous retinopathy   . Chicken pox   . Depression   . Fatty liver   . Fatty liver   . Gastric polyps   . GERD (gastroesophageal reflux disease)   . Gestational diabetes    prediabetes out side of having baby  . Headache    migraines  . Heart murmur   . History of abnormal mammogram 08/2006, 01/2008, 03/2009  . Hypertension   . IBS (irritable bowel syndrome)   . Joint disorder    hx of left ankle tendonitis, bursitis, heel spur  . Obesity 2018   BMI 45  . Pelvic pain    adhesions and adenomyosis  . Personal history of radiation therapy   . Rosacea      Past Surgical History:  Procedure Laterality Date  . ABDOMINAL HYSTERECTOMY    . BREAST BIOPSY Left 08/2006   benign fibroadenoma  . BREAST BIOPSY Right 08/11/2020   [place] bx of LN, hydromarker, Invasive mammary carcinoma  . BREAST EXCISIONAL BIOPSY Right 02/26/2008   right breast invasive mam ca with rad partial mastectomy   . BREAST SURGERY Right    x2/ Dr Tamala Julian  . CARDIAC CATHETERIZATION  2006  . CESAREAN SECTION  04/02/1998  . CHOLECYSTECTOMY  04/2010   Dr. Smith/ laprascopic surgery  . COLONOSCOPY  04/04/2000  . COLONOSCOPY WITH PROPOFOL N/A 02/12/2019   Procedure: COLONOSCOPY WITH PROPOFOL;  Surgeon: Lollie Sails, MD;  Location: [facility] ENDOSCOPY;  Service: Endoscopy;  Laterality: N/A;  . ESOPHAGOGASTRODUODENOSCOPY (EGD) WITH PROPOFOL N/A 05/01/2015   Procedure: ESOPHAGOGASTRODUODENOSCOPY (EGD) WITH PROPOFOL;  Surgeon: Hulen Luster, MD;  Location: [facility] ENDOSCOPY;  Service: Gastroenterology;  Laterality: N/A;  . ESOPHAGOGASTRODUODENOSCOPY (EGD) WITH PROPOFOL N/A 02/12/2019    Procedure: ESOPHAGOGASTRODUODENOSCOPY (EGD) WITH PROPOFOL;  Surgeon: Lollie Sails, MD;  Location: [facility] ENDOSCOPY;  Service: Endoscopy;  Laterality: N/A;  . EYE SURGERY  08/2012   laser surgery on retina/ DR Appenzeller  . FINGER SURGERY     repair of tendon in right index, Dr. Francesco Sor in [place]  . FOOT SURGERY     Dr. Milinda Pointer  . IRRIGATION AND DEBRIDEMENT SEBACEOUS CYST     right upper back  . LAPAROSCOPIC SUPRACERVICAL HYSTERECTOMY  06/2007   Dr Kincius/ adhesions/CPP/adenomyosis  . MASTECTOMY PARTIAL / LUMPECTOMY Right 01/2008   with sentinal lymph node biopsy/ infiltrating ductal cancer  . REPAIR PERONEAL TENDONS ANKLE  10/2019   with tarsal exostectomy and sural neuroma excision on right   . SKIN CANCER EXCISION     back and calves  . WISDOM TOOTH EXTRACTION  1991    Social History   Socioeconomic History  . Marital status: Married    Spouse name: Richardson Landry  . Number of children: 1  . Years of education: 77  . Highest education level: Not on file  Occupational History  .  Occupation: CMA  Tobacco Use  . Smoking status: Never Smoker  . Smokeless tobacco: Never Used  Vaping Use  . Vaping Use: Never used  Substance and Sexual Activity  . Alcohol use: Yes    Alcohol/week: 0.0 standard drinks    Comment: rare, 1-2 drinks per year  . Drug use: No  . Sexual activity: Yes    Partners: Male    Birth control/protection: Surgical    Comment: Hysterectomy   Other Topics Concern  . Not on file  Social History Narrative   Lives in [place] with husband, no pets.  Works at [employer].      Diet - regular   Exercise - occasional   Social Determinants of Health   Financial Resource Strain: Not on file  Food Insecurity: Not on file  Transportation Needs: Not on file  Physical Activity: Not on file  Stress: Not on file  Social Connections: Not on file  Intimate Partner Violence: Not on file    Family History  Problem Relation Age of Onset  . Hypertension Mother    . Thyroid disease Mother   . Breast cancer Mother 79  . Colon cancer Mother 28       again at 36  . Atrial fibrillation Mother   . Hip fracture Mother   . Skin cancer Mother   . Stroke Father   . Hypertension Father   . Cancer Father 19       prostate  . Parkinson's disease Father   . Skin cancer Father   . Hypertension Sister   . Thyroid disease Sister   . Cancer Sister        skin/ squamous cell  . Lung cancer Paternal Aunt 80       smoker  . Diabetes Maternal Grandmother   . Stroke Maternal Grandfather   . Heart disease Paternal Grandfather   . Diabetes Maternal Aunt   . Lymphoma Maternal Uncle   . Thyroid cancer Cousin        maternal  . Heart disease Maternal Uncle   . Cancer Maternal Aunt   . Breast cancer Maternal Aunt 75  . Breast cancer Cousin 60       maternal  . Breast cancer Cousin 2  . Colon cancer Cousin      Current Outpatient Medications:  .  acetaminophen (TYLENOL) 325 MG tablet, Take 650 mg by mouth every 6 (six) hours as needed for mild pain or headache., Disp: , Rfl:  .  citalopram (CELEXA) 40 MG tablet, Take 40 mg by mouth at bedtime., Disp: , Rfl:  .  clotrimazole (LOTRIMIN) 1 % cream, Apply 1 application topically daily. groin, Disp: , Rfl:  .  diltiazem (CARDIZEM CD) 120 MG 24 hr capsule, Take 1 capsule (120 mg total) by mouth daily., Disp: 30 capsule, Rfl: 11 .  diphenhydrAMINE (BENADRYL) 25 MG tablet, Take 25 mg by mouth at bedtime as needed for sleep., Disp: , Rfl:  .  ketotifen (ZADITOR) 0.025 % ophthalmic solution, Place 1 drop into both eyes daily., Disp: , Rfl:  .  letrozole (FEMARA) 2.5 MG tablet, Take 1 tablet (2.5 mg total) by mouth daily., Disp: 90 tablet, Rfl: 4 .  lisinopril (PRINIVIL,ZESTRIL) 10 MG tablet, Take 10 mg by mouth at bedtime., Disp: , Rfl:  .  Meloxicam 7.5 MG TBDP, Take 7.5 mg by mouth daily as needed., Disp: 30 tablet, Rfl: 0 .  metFORMIN (GLUCOPHAGE-XR) 500 MG 24 hr tablet, Take 500 mg by mouth  in the morning and at  bedtime., Disp: , Rfl:  .  Multiple Vitamin (MULTIVITAMIN) tablet, Take 1 tablet by mouth daily., Disp: , Rfl:  .  omeprazole (PRILOSEC) 20 MG capsule, Take 20 mg by mouth daily., Disp: , Rfl:  .  buPROPion (WELLBUTRIN XL) 300 MG 24 hr tablet, Take 300 mg by mouth at bedtime., Disp: , Rfl:  .  oxyCODONE (OXY IR/ROXICODONE) 5 MG immediate release tablet, Take 1 tablet (5 mg total) by mouth every 6 (six) hours as needed for severe pain., Disp: 60 tablet, Rfl: 0 .  palbociclib (IBRANCE) 125 MG tablet, Take 1 tablet (125 mg total) by mouth daily. Take for 21 days on, 7 days off, repeat every 28 days., Disp: 21 tablet, Rfl: 5  Physical exam:  Physical Exam Constitutional:      General: She is not in acute distress. Eyes:     Extraocular Movements: EOM normal.     Pupils: Pupils are equal, round, and reactive to light.  Pulmonary:     Effort: Pulmonary effort is normal.  Skin:    General: Skin is warm and dry.  Neurological:     Mental Status: She is alert and oriented to person, place, and time.      CMP Latest Ref Rng & Units 08/21/2020  Glucose 70 - 99 mg/dL 89  BUN 6 - 20 mg/dL 88(I)  Creatinine 1.36 - 1.00 mg/dL 5.36  Sodium 640 - 177 mmol/L 139  Potassium 3.5 - 5.1 mmol/L 4.4  Chloride 98 - 111 mmol/L 103  CO2 22 - 32 mmol/L 26  Calcium 8.9 - 10.3 mg/dL 9.3  Total Protein 6.5 - 8.1 g/dL 7.8  Total Bilirubin 0.3 - 1.2 mg/dL 0.4  Alkaline Phos 38 - 126 U/L 123  AST 15 - 41 U/L 18  ALT 0 - 44 U/L 20   CBC Latest Ref Rng & Units 08/21/2020  WBC 4.0 - 10.5 K/uL 9.2  Hemoglobin 12.0 - 15.0 g/dL 65.2  Hematocrit 85.9 - 46.0 % 40.8  Platelets 150 - 400 K/uL 272    No images are attached to the encounter.  NM PET Image Initial (PI) Skull Base To Thigh  Result Date: 08/18/2020 CLINICAL DATA:  Initial treatment strategy for right breast cancer. Biopsy-proven invasive mammary carcinoma on right axillary mass biopsy 08/11/2020. History of stage I right breast cancer in 2009 status  post conservation therapy. Chronic left hip pain for months with no reported injury. EXAM: NUCLEAR MEDICINE PET SKULL BASE TO THIGH TECHNIQUE: 13.8 mCi F-18 FDG was injected intravenously. Full-ring PET imaging was performed from the skull base to thigh after the radiotracer. CT data was obtained and used for attenuation correction and anatomic localization. Fasting blood glucose: 92 mg/dl COMPARISON:  18/60/2221 chest CT angiogram. 08/19/2019 MRI abdomen. 05/22/2012 CT abdomen/pelvis. 05/21/2011 PET-CT. FINDINGS: Mediastinal blood pool activity: SUV max 2.5 Liver activity: SUV max NA NECK: No hypermetabolic lymph nodes in the neck. Incidental CT findings: Small mucous retention cyst versus polyp in the inferior left maxillary sinus. CHEST: Hypermetabolic poorly marginated solid 2.5 x 2.0 cm right axillary mass with max SUV 5.6 (series 3/image 75). No enlarged or hypermetabolic mediastinal or hilar lymph nodes. No enlarged or hypermetabolic left axillary lymph nodes. No hypermetabolic pulmonary findings. Incidental CT findings: Anterior right lower lobe 4 mm solid pulmonary nodule (series 3/image 92), stable since 2012 PET-CT and considered benign. Posterior right middle lobe 6 mm solid pulmonary nodule (series 3/image 102) and basilar left lower lobe 7 mm  solid pulmonary nodule (series 3/image 113), both stable since recent 07/22/2020 chest CT angiogram study, not definitely seen on 2012 PET-CT, below PET resolution. Postsurgical changes from remote lumpectomy in the right breast. ABDOMEN/PELVIS: No abnormal hypermetabolic activity within the pancreas, adrenal glands, or spleen. No hypermetabolic lymph nodes in the abdomen or pelvis. Subtle small focus of hypermetabolism in inferior right liver with max SUV 6.0, without definite CT correlate, equivocal for liver metastasis. Incidental CT findings: Cholecystectomy. 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 Mild sigmoid diverticulosis. SKELETON:  Multiple hypermetabolic faintly lytic osseous lesions throughout lumbar spine and bilateral pelvic girdle, with representative lesions as follows: -supra-acetabular right iliac bone 1.9 cm lesion with max SUV 6.7 (series 3/image 215) -anterior superior left acetabular 1.7 cm lesion with max SUV 7.8 (series 3/image 222) -posterior right L1 vertebral 1.2 cm lesion with max SUV 5.6 (series 3/image 149) Incidental CT findings: none IMPRESSION: 1. Hypermetabolic poorly marginated solid 2.5 cm right axillary mass compatible with known biopsy-proven malignancy (either right axillary tail primary neoplasm or infiltrative right axillary lymph node metastasis). 2. 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 3. Subtle small focus of hypermetabolism in the inferior right liver without definite CT correlate, equivocal for liver metastasis. MRI abdomen without with IV contrast may be obtained for further evaluation if clinically warranted. 4. Solid 6 mm right middle lobe and 7 mm left lower lobe pulmonary nodules, below PET resolution, stable since recent 07/22/2020 chest CT angiogram study, not definitely seen on 2012 PET-CT, warranting continued chest CT surveillance in 3-6 months. 5. Chronic findings include: Tiny upper left renal angiomyolipoma. Mild sigmoid diverticulosis. Electronically Signed   By: Ilona Sorrel M.D.   On: 08/18/2020 16:14   DG Chest Port 1 View  Result Date: 08/07/2020 CLINICAL DATA:  60 year old female who woke at 0400 hours with palpitations and shortness of breath. Positive for COVID-19. 07/14/2020. EXAM: PORTABLE CHEST 1 VIEW COMPARISON:  CTA chest 07/22/2020 and earlier. FINDINGS: Portable AP upright view at 0549 hours. Stable lung volumes and mediastinal contours, within normal limits. Visualized tracheal air column is within normal limits. Allowing for portable technique the lungs are clear. No pneumothorax. No acute osseous  abnormality identified. Paucity of bowel gas in the upper abdomen. IMPRESSION: Negative portable chest. Electronically Signed   By: Genevie Ann M.D.   On: 08/07/2020 06:02   US BREAST LTD UNI RIGHT INC AXILLA  Result Date: 08/20/2020 CLINICAL DATA:  60 year old female with biopsy proven invasive mammary carcinoma involving a right axillary mass. Patient has a history of remote stage I breast cancer, treated in 2009. History of remote excisional left breast biopsy for a fibroadenoma. EXAM: DIGITAL DIAGNOSTIC BILATERAL MAMMOGRAM WITH TOMOSYNTHESIS AND CAD; ULTRASOUND RIGHT BREAST LIMITED TECHNIQUE: Bilateral digital diagnostic mammography and breast tomosynthesis was performed. The images were evaluated with computer-aided detection.; Targeted ultrasound examination of the right breast was performed. COMPARISON:  Previous exam(s). ACR Breast Density Category b: There are scattered areas of fibroglandular density. FINDINGS: Stable postsurgical changes noted in the bilateral breasts. No new or suspicious mammographic findings in either breast. Targeted ultrasound is performed, read demonstrating an irregular, hypoechoic mass in the deep right axilla. It measures 2.6 x 2.0 x 1.9 cm (previously 2.8 x 1.8 x 1.6 cm). IMPRESSION: 1. No mammographic evidence of malignancy in either breast. 2. Grossly stable appearance of a right axillary mass, consistent with the patient's biopsy-proven site of malignancy. RECOMMENDATION: Per clinical treatment plan.  I have discussed the findings and recommendations with the patient. If applicable, a reminder letter will be sent to the patient regarding the next appointment. BI-RADS CATEGORY  6: Known biopsy-proven malignancy. Electronically Signed   By: Kristopher Oppenheim M.D.   On: 08/20/2020 12:06   US BREAST LTD UNI RIGHT INC AXILLA  Result Date: 08/06/2020 CLINICAL DATA:  60 year old female with history of right breast cancer in 2009 status post lumpectomy. Patient presents for evaluation  of a right axillary mass identified on recent CT scan. EXAM: ULTRASOUND OF THE RIGHT BREAST COMPARISON:  Previous exam(s). FINDINGS: On physical exam, I feel a firm mass in the low right axilla. Targeted ultrasound is performed in the right axilla demonstrating an irregular hypoechoic mass measuring 2.8 x 1.6 x 1.8 cm. Targeted ultrasound of regional axillary lymph nodes demonstrates no additional abnormal lymph node. IMPRESSION: Suspicious irregular mass in the right axilla measuring 2.8 cm, possibly an abnormal lymph node versus primary malignancy. No additional abnormal lymph nodes are identified sonographically in the right axilla though there do appear to be additional smaller adjacent suspicious masses on the patient's CT scan. RECOMMENDATION: Ultrasound-guided core needle biopsy of the right axillary mass. I have discussed the findings and recommendations with the patient who agrees to proceed with biopsy. The patient will be contacted by our scheduler to make the biopsy appointment BI-RADS CATEGORY  4: Suspicious. Electronically Signed   By: Audie Pinto M.D.   On: 08/06/2020 13:44   MM DIAG BREAST TOMO BILATERAL  Result Date: 08/20/2020 CLINICAL DATA:  60 year old female with biopsy proven invasive mammary carcinoma involving a right axillary mass. Patient has a history of remote stage I breast cancer, treated in 2009. History of remote excisional left breast biopsy for a fibroadenoma. EXAM: DIGITAL DIAGNOSTIC BILATERAL MAMMOGRAM WITH TOMOSYNTHESIS AND CAD; ULTRASOUND RIGHT BREAST LIMITED TECHNIQUE: Bilateral digital diagnostic mammography and breast tomosynthesis was performed. The images were evaluated with computer-aided detection.; Targeted ultrasound examination of the right breast was performed. COMPARISON:  Previous exam(s). ACR Breast Density Category b: There are scattered areas of fibroglandular density. FINDINGS: Stable postsurgical changes noted in the bilateral breasts. No new or  suspicious mammographic findings in either breast. Targeted ultrasound is performed, read demonstrating an irregular, hypoechoic mass in the deep right axilla. It measures 2.6 x 2.0 x 1.9 cm (previously 2.8 x 1.8 x 1.6 cm). IMPRESSION: 1. No mammographic evidence of malignancy in either breast. 2. Grossly stable appearance of a right axillary mass, consistent with the patient's biopsy-proven site of malignancy. RECOMMENDATION: Per clinical treatment plan. I have discussed the findings and recommendations with the patient. If applicable, a reminder letter will be sent to the patient regarding the next appointment. BI-RADS CATEGORY  6: Known biopsy-proven malignancy. Electronically Signed   By: Kristopher Oppenheim M.D.   On: 08/20/2020 12:06   ECHOCARDIOGRAM COMPLETE  Result Date: 08/08/2020    ECHOCARDIOGRAM REPORT   Patient Name:   HEND MCCARRELL Date of Exam: 08/08/2020 Medical Rec #:  [id number]      Height:       63.0 in Accession #:    [id number]     Weight:       257.1 lb Date of Birth:  1960/09/26      BSA:          2.151 m Patient Age:    21 years       BP:           123/75 mmHg Patient Gender: F  HR:           74 bpm. Exam Location:  [facility] Procedure: 2D Echo, Color Doppler and Cardiac Doppler Indications:     I48.91 Atrial fibrillation  History:         Patient has no prior history of Echocardiogram examinations.                  Risk Factors:Hypertension. Pt tested positive for COVID-19 on                  07/15/20.  Sonographer:     Charmayne Sheer RDCS (AE) Referring Phys:  [id number] DWAYNE D CALLWOOD Diagnosing Phys: Yolonda Kida MD  Sonographer Comments: Suboptimal parasternal window and suboptimal subcostal window. Image acquisition challenging due to patient body habitus. IMPRESSIONS  1. Left ventricular ejection fraction, by estimation, is 60 to 65%. The left ventricle has normal function. The left ventricle has no regional wall motion abnormalities. Left ventricular diastolic parameters were  normal.  2. Right ventricular systolic function is normal. The right ventricular size is normal.  3. The mitral valve is normal in structure. No evidence of mitral valve regurgitation.  4. The aortic valve is grossly normal. Aortic valve regurgitation is not visualized. FINDINGS  Left Ventricle: Left ventricular ejection fraction, by estimation, is 60 to 65%. The left ventricle has normal function. The left ventricle has no regional wall motion abnormalities. The left ventricular internal cavity size was normal in size. There is  no left ventricular hypertrophy. Left ventricular diastolic parameters were normal. Right Ventricle: The right ventricular size is normal. No increase in right ventricular wall thickness. Right ventricular systolic function is normal. Left Atrium: Left atrial size was normal in size. Right Atrium: Right atrial size was normal in size. Pericardium: There is no evidence of pericardial effusion. Mitral Valve: The mitral valve is normal in structure. No evidence of mitral valve regurgitation. MV peak gradient, 3.1 mmHg. The mean mitral valve gradient is 1.0 mmHg. Tricuspid Valve: The tricuspid valve is normal in structure. Tricuspid valve regurgitation is not demonstrated. Aortic Valve: The aortic valve is grossly normal. Aortic valve regurgitation is not visualized. Aortic valve mean gradient measures 7.0 mmHg. Aortic valve peak gradient measures 15.8 mmHg. Aortic valve area, by VTI measures 2.09 cm. Pulmonic Valve: The pulmonic valve was normal in structure. Pulmonic valve regurgitation is not visualized. Aorta: The ascending aorta was not well visualized. IAS/Shunts: No atrial level shunt detected by color flow Doppler.  LEFT VENTRICLE PLAX 2D LVIDd:         4.90 cm  Diastology LVIDs:         3.40 cm  LV e' medial:    10.90 cm/s LV PW:         1.10 cm  LV E/e' medial:  9.2 LV IVS:        0.80 cm  LV e' lateral:   10.00 cm/s LVOT diam:     1.70 cm  LV E/e' lateral: 10.0 LV SV:         66 LV  SV Index:   31 LVOT Area:     2.27 cm  RIGHT VENTRICLE RV Basal diam:  3.30 cm TAPSE (M-mode): 2.3 cm LEFT ATRIUM             Index      RIGHT ATRIUM           Index LA diam:        3.50 cm 1.63 cm/m RA Area:  12.50 cm LA Vol (A2C):   19.4 ml 9.02 ml/m RA Volume:   31.80 ml  14.78 ml/m LA Vol (A4C):   17.9 ml 8.32 ml/m LA Biplane Vol: 19.0 ml 8.83 ml/m  AORTIC VALVE                    PULMONIC VALVE AV Area (Vmax):    1.68 cm     PV Vmax:       1.15 m/s AV Area (Vmean):   1.86 cm     PV Vmean:      81.600 cm/s AV Area (VTI):     2.09 cm     PV VTI:        0.222 m AV Vmax:           199.00 cm/s  PV Peak grad:  5.3 mmHg AV Vmean:          123.000 cm/s PV Mean grad:  3.0 mmHg AV VTI:            0.315 m AV Peak Grad:      15.8 mmHg AV Mean Grad:      7.0 mmHg LVOT Vmax:         147.00 cm/s LVOT Vmean:        101.000 cm/s LVOT VTI:          0.290 m LVOT/AV VTI ratio: 0.92  AORTA Ao Root diam: 2.50 cm MITRAL VALVE MV Area (PHT): 4.08 cm     SHUNTS MV Area VTI:   2.71 cm     Systemic VTI:  0.29 m MV Peak grad:  3.1 mmHg     Systemic Diam: 1.70 cm MV Mean grad:  1.0 mmHg MV Vmax:       0.88 m/s MV Vmean:      57.0 cm/s MV Decel Time: 186 msec MV E velocity: 100.00 cm/s MV A velocity: 103.00 cm/s MV E/A ratio:  0.97 Dwayne D Callwood MD Electronically signed by Yolonda Kida MD Signature Date/Time: 08/08/2020/1:06:57 PM    Final    MM CLIP PLACEMENT RIGHT  Result Date: 08/11/2020 CLINICAL DATA:  Status post ultrasound-guided core biopsy of RIGHT axillary mass. EXAM: DIAGNOSTIC RIGHT MAMMOGRAM POST ULTRASOUND BIOPSY COMPARISON:  Previous exam(s). FINDINGS: Mammographic images were obtained following ultrasound guided biopsy of RIGHT axillary mass and placement of a butterfly HydroMARK shaped clip. The biopsy marking clip is in expected position at the site of biopsy. IMPRESSION: Appropriate positioning of the butterfly HydroMARK shaped biopsy marking clip at the site of biopsy in the location. Final  Assessment: Post Procedure Mammograms for Marker Placement Electronically Signed   By: Nolon Nations M.D.   On: 08/11/2020 08:58   DG HIP UNILAT WITH PELVIS 2-3 VIEWS LEFT  Result Date: 08/16/2020 CLINICAL DATA:  Chronic pain in the left hip. EXAM: DG HIP (WITH OR WITHOUT PELVIS) 2-3V LEFT COMPARISON:  None. FINDINGS: There is no evidence of hip fracture or dislocation. Mild osteoarthritic changes of bilateral hips with subchondral sclerosis of the acetabula and minimal osteophytosis. Soft tissues are normal. IMPRESSION: 1. No acute fracture or dislocation identified about the left hip. 2. Mild osteoarthritic changes of bilateral hips. Electronically Signed   By: Fidela Salisbury M.D.   On: 08/16/2020 16:17   [place] RT BREAST BX W LOC DEV 1ST LESION IMG BX SPEC US GUIDE  Addendum Date: 08/15/2020   ADDENDUM REPORT: 08/15/2020 14:13 ADDENDUM: PATHOLOGY revealed: A. SOFT TISSUE MASS, RIGHT AXILLA; ULTRASOUND-GUIDED CORE BIOPSY: -  INVASIVE MAMMARY CARCINOMA. Grade 3. Comment: Invasive carcinoma involves nearly the full length of the cores, and measures 16 mm in this sample. No carcinoma in situ is identified. The carcinoma shows absence of tubule/gland formation and moderate nuclear pleomorphism. The mitotic rate is high, 18 per 10 high power fields (field diameter 0.55 mm). Pathology results are CONCORDANT with imaging findings, per Dr. Norva Pavlov. Pathology results and recommendations below were discussed with patient by telephone on 08/13/2020. Patient reported biopsy site within normal limits with slight tenderness at the site. Post biopsy care instructions were reviewed, questions were answered and my direct phone number was provided to patient. Patient was instructed to call [facility] if any concerns or questions arise related to the biopsy. Recommend surgical and oncological consultation: Request for surgical consultation relayed to Coralee Rud RN and Molli Posey RN at [facility] by Randa Lynn RN on 08/13/2020. Pathology results reported by Randa Lynn RN on 08/15/2020. Electronically Signed   By: Norva Pavlov M.D.   On: 08/15/2020 14:13   Result Date: 08/15/2020 CLINICAL DATA:  Patient presents for ultrasound-guided core biopsy of RIGHT axillary mass. EXAM: ULTRASOUND GUIDED RIGHT AXILLARY CORE NEEDLE BIOPSY COMPARISON:  Previous exam(s). PROCEDURE: I met with the patient and we discussed the procedure of ultrasound-guided biopsy, including benefits and alternatives. We discussed the high likelihood of a successful procedure. We discussed the risks of the procedure, including infection, bleeding, tissue injury, clip migration, and inadequate sampling. Informed written consent was given. The usual time-out protocol was performed immediately prior to the procedure. Lesion quadrant: RIGHT axilla Using sterile technique and 1% Lidocaine as local anesthetic, under direct ultrasound visualization, a 14 gauge spring-loaded device was used to perform biopsy of RIGHT axillary mass using a LATERAL to MEDIAL approach. At the conclusion of the procedure butterfly HydroMARK tissue marker clip was deployed into the biopsy cavity. Follow up 2 view mammogram was performed and dictated separately. IMPRESSION: Ultrasound guided biopsy of RIGHT axillary mass. No apparent complications. Electronically Signed: By: Norva Pavlov M.D. On: 08/11/2020 08:56     Assessment and plan- Patient is a 60 y.o. 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  I have reviewed PET/CT scan images independently and discussed findings with the patient.  Unfortunately PET CT scan shows multiple areas of bone metastases including the left acetabulum, right supra-acetabular area as well as L1 vertebral spine area in addition to the known right axillary mass that was biopsy-proven breast cancer.  She therefore  has stage IV breast cancer.  Treatment would be palliative and not curative at this time and surgery for the right axillary mass is not indicated at this time.  Her tumor is grade 3 ER strongly +90%, PR 1 to 10% positive and HER-2 negative.  Given ER positive relatively low volume bone predominant disease I would recommend first-line therapy with letrozole plus Ibrance.  Patient does tell me that she has a second opinion coming up at [facility] and I did give her the option of holding off on starting treatment to see if she would be a candidate for clinical trial at [facility] and starting endocrine therapy now may make her ineligible for any upfront trials.  Patient understands but would like to proceed with treatment without any further delays.  Discussed risks and benefits of letrozole including all but not limited to hot flashes, mood swings, fatigue, arthralgias and worsening  bone health.  Discussed risks and benefits of Ibrance including all but not limited to fatigue, diarrhea, low blood counts including neutropenia and risk of infections.  Patient understands and consents to treatment at this time.  We will get working on her insurance approval for [place] but I will go ahead and prescribe her letrozole starting today.  I will start her on Ibrance 125 mg 3 weeks on 1 week off but if we encountered significant neutropenia I will consider reducing her dose 200 or 75 mg.  If patient has problems tolerating Ibrance I will consider switching her to abemaciclib at that point.  I will get a baseline MRI brain with and without contrast to complete her staging work-up.  I will check a Ki-67 on her tumor specimen and also send off NGS testing.  Discussed the role of bisphosphonates and bone metastases and breast cancer and I would recommend monthly Zometa to reduce the risk of future skeletal fractures.  Discussed risks and benefits of Zometa including all but not limited to fatigue, hypocalcemia and osteonecrosis of  the jaw.  She would need dental clearance prior prior to starting Zometa.  Neoplasm related pain: I will also refer her to radiation oncology for her left hip pain and start her on oxycodone 5 mg every 6 hours as needed  Repeat CBC with differential in 2 weeks in 4 weeks and I will see her back in 4 weeks to see how she is tolerating Ibrance.  Baseline tumor markers were also checked today and were within normal limits.   Cancer Staging Metastatic breast cancer [facility]) Staging form: Breast, AJCC 8th Edition - Clinical stage from 08/19/2020: Stage IV (rcTX, cNX, cM1, G3, ER+, PR+, HER2-) - Signed by Sindy Guadeloupe, MD on 08/23/2020 Stage prefix: Recurrence   Total face to face encounter time for this patient visit was 45 min.     Visit Diagnosis 1. Metastatic breast cancer ([place])   2. Goals of care, counseling/discussion   3. Neoplasm related pain   4. Bone metastases ([place])      Dr. Randa Evens, MD, MPH [facility] [phone] 08/23/2020 7:30 PM

## 2020-08-23 NOTE — Progress Notes (Signed)
START ON PATHWAY REGIMEN - Breast     A cycle is every 28 days:     Letrozole      Palbociclib   **Always confirm dose/schedule in your pharmacy ordering system**  Patient Characteristics: Distant Metastases or Locoregional Recurrent Disease - Unresected or Locally Advanced Unresectable Disease Progressing after Neoadjuvant and Local Therapies, HER2 Negative/Unknown/Equivocal, ER Positive, Endocrine Therapy, Postmenopausal, First Line,  Prior Adjuvant Endocrine Therapy Completed > 12 Months Ago, Candidate for CDK4/6 Inhibitor Therapeutic Status: Distant Metastases ER Status: Positive (+) HER2 Status: Negative (-) PR Status: Positive (+) Therapy Approach Indicated: Standard Chemotherapy/Endocrine Therapy Menopausal Status: Postmenopausal Line of Therapy: First Line Previous Therapy: Prior Adjuvant Endocrine Therapy Completed > 12 Months Ago Intent of Therapy: Non-Curative / Palliative Intent, Discussed with Patient

## 2020-08-24 ENCOUNTER — Encounter: Payer: Self-pay | Admitting: *Deleted

## 2020-08-25 ENCOUNTER — Other Ambulatory Visit: Payer: Self-pay | Admitting: Oncology

## 2020-08-25 ENCOUNTER — Telehealth: Payer: Self-pay | Admitting: Oncology

## 2020-08-25 ENCOUNTER — Encounter: Payer: Self-pay | Admitting: Pharmacist

## 2020-08-25 ENCOUNTER — Ambulatory Visit
Admission: RE | Admit: 2020-08-25 | Discharge: 2020-08-25 | Disposition: A | Discharge status: Home / Self Care | Payer: 59 | Source: Ambulatory Visit | Attending: Radiation Oncology | Admitting: Radiation Oncology

## 2020-08-25 VITALS — BP 115/66 | HR 78 | Temp 97.5°F

## 2020-08-25 DIAGNOSIS — C50611 Malignant neoplasm of axillary tail of right female breast: Secondary | ICD-10-CM | POA: Diagnosis not present

## 2020-08-25 DIAGNOSIS — C50919 Malignant neoplasm of unspecified site of unspecified female breast: Secondary | ICD-10-CM

## 2020-08-25 DIAGNOSIS — C7951 Secondary malignant neoplasm of bone: Secondary | ICD-10-CM

## 2020-08-25 DIAGNOSIS — Z170 Estrogen receptor positive status [ER+]: Secondary | ICD-10-CM | POA: Diagnosis not present

## 2020-08-25 NOTE — Telephone Encounter (Signed)
Called pt to make her aware of MRI scheduled on 2/22. Pt confirmed the day and time. Appt reminder/instructions left with radiation staff--pt has an appt today.

## 2020-08-25 NOTE — Consult Note (Signed)
NEW PATIENT EVALUATION  Name: Jacqueline Deleon  MRN: [id number]  Date:   08/25/2020     DOB: 1960-11-10   This 60 y.o. female patient presents to the clinic for initial evaluation of left hip bone metastasis and patient previously treated in 2009 for stage I right breast cancer now with stage IV disease.  REFERRING PHYSICIAN: Derinda Late, MD  CHIEF COMPLAINT:  Chief Complaint  Patient presents with  . consult    DIAGNOSIS: There were no encounter diagnoses.   PREVIOUS INVESTIGATIONS:  PET CT scan reviewed mammograms reviewed Clinical notes reviewed Pathology report reviewed  HPI: Patient is a 60 year old female former patient of mine treated back in 2009 for tamoxifen for 5 years stage I right breast cancer 1.4 cm tumor with negative lymph nodes.  She had no adjuvant chemotherapy or Oncotype DX score was 6 and she was on tamoxifen for 5 years she presented with a lump in her right axilla in September 2021 ultrasound did not pick up any axillary mass although she was admitted for Covid pneumonia and underwent CT scan in January which incidentally picked up the axillary mass.  Biopsy of this mass was positive for ER strongly positive invasive mammary carcinoma.  PET CT scan demonstrated hypermetabolic poorly marginated 2.5 cm right axillary mass as well as multifocal hypermetabolic lytic bone lesions in the lumbar spine bilateral carpal girdle.  She also had a focus of hypermetabolic in the inferior right liver equivocal for liver metastasis.  She also had hypermetabolic activity consistent with metastatic disease in the left hip for which she is complaining of significant narcotic dependent pain.  I been asked to evaluate her for possible palliative radiation therapy to her left hip.  She is having no other areas of pain at this time.  She is to start on letrozole plus Ibrance.  PLANNED TREATMENT REGIMEN: Palliative radiation therapy to left hip  PAST MEDICAL HISTORY:  has a past medical  history of Allergic genetic state, Arthritis, Breast cancer ([place]), Breast mass (08/2006, 03/2009), Cancer of the skin, basal cell (03/2016), Central serous retinopathy, Chicken pox, Depression, Fatty liver, Fatty liver, Gastric polyps, GERD (gastroesophageal reflux disease), Gestational diabetes, Headache, Heart murmur, History of abnormal mammogram (08/2006, 01/2008, 03/2009), Hypertension, IBS (irritable bowel syndrome), Joint disorder, Obesity (2018), Pelvic pain, Personal history of radiation therapy, and Rosacea.    PAST SURGICAL HISTORY:  Past Surgical History:  Procedure Laterality Date  . ABDOMINAL HYSTERECTOMY    . BREAST BIOPSY Left 08/2006   benign fibroadenoma  . BREAST BIOPSY Right 08/11/2020   [place] bx of LN, hydromarker, Invasive mammary carcinoma  . BREAST EXCISIONAL BIOPSY Right 02/26/2008   right breast invasive mam ca with rad partial mastectomy   . BREAST SURGERY Right    x2/ Dr Tamala Julian  . CARDIAC CATHETERIZATION  2006  . CESAREAN SECTION  04/02/1998  . CHOLECYSTECTOMY  04/2010   Dr. Smith/ laprascopic surgery  . COLONOSCOPY  04/04/2000  . COLONOSCOPY WITH PROPOFOL N/A 02/12/2019   Procedure: COLONOSCOPY WITH PROPOFOL;  Surgeon: Lollie Sails, MD;  Location: [facility] ENDOSCOPY;  Service: Endoscopy;  Laterality: N/A;  . ESOPHAGOGASTRODUODENOSCOPY (EGD) WITH PROPOFOL N/A 05/01/2015   Procedure: ESOPHAGOGASTRODUODENOSCOPY (EGD) WITH PROPOFOL;  Surgeon: Hulen Luster, MD;  Location: [facility] ENDOSCOPY;  Service: Gastroenterology;  Laterality: N/A;  . ESOPHAGOGASTRODUODENOSCOPY (EGD) WITH PROPOFOL N/A 02/12/2019   Procedure: ESOPHAGOGASTRODUODENOSCOPY (EGD) WITH PROPOFOL;  Surgeon: Lollie Sails, MD;  Location: [facility] ENDOSCOPY;  Service: Endoscopy;  Laterality: N/A;  . EYE SURGERY  08/2012   laser surgery on retina/ DR Appenzeller  . FINGER SURGERY     repair of tendon in right index, Dr. Francesco Sor in [place]  . FOOT SURGERY     Dr. Milinda Pointer  . IRRIGATION AND DEBRIDEMENT SEBACEOUS CYST      right upper back  . LAPAROSCOPIC SUPRACERVICAL HYSTERECTOMY  06/2007   Dr Kincius/ adhesions/CPP/adenomyosis  . MASTECTOMY PARTIAL / LUMPECTOMY Right 01/2008   with sentinal lymph node biopsy/ infiltrating ductal cancer  . REPAIR PERONEAL TENDONS ANKLE  10/2019   with tarsal exostectomy and sural neuroma excision on right   . SKIN CANCER EXCISION     back and calves  . WISDOM TOOTH EXTRACTION  1991    FAMILY HISTORY: family history includes Atrial fibrillation in her mother; Breast cancer (age of onset: 5) in her cousin; Breast cancer (age of onset: 24) in her cousin; Breast cancer (age of onset: 60) in her maternal aunt; Breast cancer (age of onset: 41) in her mother; Cancer in her maternal aunt and sister; Cancer (age of onset: 26) in her father; Colon cancer in her cousin; Colon cancer (age of onset: 56) in her mother; Diabetes in her maternal aunt and maternal grandmother; Heart disease in her maternal uncle and paternal grandfather; Hip fracture in her mother; Hypertension in her father, mother, and sister; Lung cancer (age of onset: 18) in her paternal aunt; Lymphoma in her maternal uncle; Parkinson's disease in her father; Skin cancer in her father and mother; Stroke in her father and maternal grandfather; Thyroid cancer in her cousin; Thyroid disease in her mother and sister.  SOCIAL HISTORY:  reports that she has never smoked. She has never used smokeless tobacco. She reports current alcohol use. She reports that she does not use drugs.  ALLERGIES: Kiwi extract, Codeine, Grapeseed extract [nutritional supplements], Amoxicillin, Erythromycin, Sulfa antibiotics, and Tape  MEDICATIONS:  Current Outpatient Medications  Medication Sig Dispense Refill  . acetaminophen (TYLENOL) 325 MG tablet Take 650 mg by mouth every 6 (six) hours as needed for mild pain or headache.    . citalopram (CELEXA) 40 MG tablet Take 40 mg by mouth at bedtime.    . clotrimazole (LOTRIMIN) 1 % cream Apply 1  application topically daily. groin    . diltiazem (CARDIZEM CD) 120 MG 24 hr capsule Take 1 capsule (120 mg total) by mouth daily. 30 capsule 11  . diphenhydrAMINE (BENADRYL) 25 MG tablet Take 25 mg by mouth at bedtime as needed for sleep.    Marland Kitchen ketotifen (ZADITOR) 0.025 % ophthalmic solution Place 1 drop into both eyes daily.    Marland Kitchen letrozole (FEMARA) 2.5 MG tablet Take 1 tablet (2.5 mg total) by mouth daily. 90 tablet 4  . lisinopril (PRINIVIL,ZESTRIL) 10 MG tablet Take 10 mg by mouth at bedtime.    . Meloxicam 7.5 MG TBDP Take 7.5 mg by mouth daily as needed. 30 tablet 0  . metFORMIN (GLUCOPHAGE-XR) 500 MG 24 hr tablet Take 500 mg by mouth in the morning and at bedtime.    . Multiple Vitamin (MULTIVITAMIN) tablet Take 1 tablet by mouth daily.    Marland Kitchen omeprazole (PRILOSEC) 20 MG capsule Take 20 mg by mouth daily.    Marland Kitchen oxyCODONE (OXY IR/ROXICODONE) 5 MG immediate release tablet Take 1 tablet (5 mg total) by mouth every 6 (six) hours as needed for severe pain. 60 tablet 0  . palbociclib (IBRANCE) 125 MG tablet Take 1 tablet (125 mg total) by mouth daily. Take for 21 days on,  7 days off, repeat every 28 days. 21 tablet 5  . buPROPion (WELLBUTRIN XL) 300 MG 24 hr tablet Take 300 mg by mouth at bedtime.     No current facility-administered medications for this encounter.    ECOG PERFORMANCE STATUS:  1 - Symptomatic but completely ambulatory  REVIEW OF SYSTEMS: Patient denies any weight loss, fatigue, weakness, fever, chills or night sweats. Patient denies any loss of vision, blurred vision. Patient denies any ringing  of the ears or hearing loss. No irregular heartbeat. Patient denies heart murmur or history of fainting. Patient denies any chest pain or pain radiating to her upper extremities. Patient denies any shortness of breath, difficulty breathing at night, cough or hemoptysis. Patient denies any swelling in the lower legs. Patient denies any nausea vomiting, vomiting of blood, or coffee ground  material in the vomitus. Patient denies any stomach pain. Patient states has had normal bowel movements no significant constipation or diarrhea. Patient denies any dysuria, hematuria or significant nocturia. Patient denies any problems walking, swelling in the joints or loss of balance. Patient denies any skin changes, loss of hair or loss of weight. Patient denies any excessive worrying or anxiety or significant depression. Patient denies any problems with insomnia. Patient denies excessive thirst, polyuria, polydipsia. Patient denies any swollen glands, patient denies easy bruising or easy bleeding. Patient denies any recent infections, allergies or URI. Patient "s visual fields have not changed significantly in recent time.   PHYSICAL EXAM: BP 115/66   Pulse 78   Temp (!) 97.5 F (36.4 C) (Tympanic)  Range of motion of her left lower extremity does elicit pain.  Motor or sensory and DTR levels are equal and symmetric in the lower extremities.  No pain is elicited on deep palpation of her spine.  Well-developed well-nourished patient in NAD. HEENT reveals PERLA, EOMI, discs not visualized.  Oral cavity is clear. No oral mucosal lesions are identified. Neck is clear without evidence of cervical or supraclavicular adenopathy. Lungs are clear to A&P. Cardiac examination is essentially unremarkable with regular rate and rhythm without murmur rub or thrill. Abdomen is benign with no organomegaly or masses noted. Motor sensory and DTR levels are equal and symmetric in the upper and lower extremities. Cranial nerves II through XII are grossly intact. Proprioception is intact. No peripheral adenopathy or edema is identified. No motor or sensory levels are noted. Crude visual fields are within normal range.  LABORATORY DATA: Pathology report reviewed    RADIOLOGY RESULTS: CT scans and PET CT scans reviewed compatible with above-stated findings   IMPRESSION: Stage IV breast cancer with bone metastasis causing  significant necrotic dependent pain in the left hip in 60 year old female treated back in 2009 for stage I breast cancer  PLAN: At this time elect go ahead with palliative radiation therapy to her left hip based on is a weightbearing area would plan on delivering 30 Gray in 10 fractions.  Risks and benefits of treatment including fatigue skin reaction all were discussed in detail with the patient.  Other areas may warrant palliative treatment to future although this is the only area at this time that warrants such treatment.  Patient comprehends my recommendations well.  I would like to take this opportunity to thank you for allowing me to participate in the care of your patient.Noreene Filbert, MD

## 2020-08-26 ENCOUNTER — Encounter: Payer: Self-pay | Admitting: Oncology

## 2020-08-26 ENCOUNTER — Ambulatory Visit: Payer: 59

## 2020-08-27 ENCOUNTER — Other Ambulatory Visit: Payer: Self-pay

## 2020-08-27 ENCOUNTER — Ambulatory Visit (INDEPENDENT_AMBULATORY_CARE_PROVIDER_SITE_OTHER): Payer: 59 | Admitting: Cardiology

## 2020-08-27 ENCOUNTER — Encounter: Payer: Self-pay | Admitting: Cardiology

## 2020-08-27 VITALS — BP 120/60 | HR 90 | Ht 63.0 in | Wt 262.0 lb

## 2020-08-27 DIAGNOSIS — E119 Type 2 diabetes mellitus without complications: Secondary | ICD-10-CM

## 2020-08-27 DIAGNOSIS — K7460 Unspecified cirrhosis of liver: Secondary | ICD-10-CM

## 2020-08-27 DIAGNOSIS — I4892 Unspecified atrial flutter: Secondary | ICD-10-CM

## 2020-08-27 DIAGNOSIS — Z79810 Long term (current) use of selective estrogen receptor modulators (SERMs): Secondary | ICD-10-CM | POA: Diagnosis not present

## 2020-08-27 DIAGNOSIS — C50911 Malignant neoplasm of unspecified site of right female breast: Secondary | ICD-10-CM | POA: Diagnosis not present

## 2020-08-27 DIAGNOSIS — C50919 Malignant neoplasm of unspecified site of unspecified female breast: Secondary | ICD-10-CM

## 2020-08-27 DIAGNOSIS — N6321 Unspecified lump in the left breast, upper outer quadrant: Secondary | ICD-10-CM | POA: Diagnosis not present

## 2020-08-27 NOTE — Patient Instructions (Addendum)
Medication Instructions:  Your physician recommends that you continue on your current medications as directed. Please refer to the Current Medication list given to you today. *If you need a refill on your cardiac medications before your next appointment, please call your pharmacy*  Lab Work: None ordered. If you have labs (blood work) drawn today and your tests are completely normal, you will receive your results only by: Marland Kitchen MyChart Message (if you have MyChart) OR . A paper copy in the mail If you have any lab test that is abnormal or we need to change your treatment, we will call you to review the results.  Testing/Procedures: None ordered.  Follow-Up: At [facility], you and your health needs are our priority.  As part of our continuing mission to provide you with exceptional heart care, we have created designated Provider Care Teams.  These Care Teams include your primary Cardiologist (physician) and Advanced Practice Providers (APPs -  Physician Assistants and Nurse Practitioners) who all work together to provide you with the care you need, when you need it.  Your next appointment:   Your physician wants you to follow-up in: 3 months with Dr. Quentin Ore at the [facility] office.

## 2020-08-27 NOTE — Progress Notes (Signed)
Electrophysiology Office Note:    Date:  08/27/2020   ID:  Jacqueline Deleon, DOB 1961-05-10, MRN [id number]  PCP:  Derinda Late, MD  [place] Cardiologist:  No primary care provider on file.  [facility] Electrophysiologist:  Vickie Epley, MD   Referring MD: Olean Ree, MD   Chief Complaint: Atrial fibrillation  History of Present Illness:    Jacqueline Deleon is a 60 y.o. female who presents for an evaluation of atrial fibrillation at the request of Dr. Hampton Abbot. Their medical history includes hypertension, hyperlipidemia, diabetes, GERD, depression, breast cancer, cirrhosis, sleep apnea on CPAP who presents to the clinic after recent hospitalization on January 27 for atrial flutter with rapid ventricular rates.  She presented to the hospital where she was diagnosed with atrial flutter and she was started on a Cardizem drip.  She converted to sinus rhythm on the Cardizem and was transitioned to oral Cardizem.  She was referred to cardiology to discuss anticoagulation given a planned biopsy of a breast mass.  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  She has a prior history in 2009 of stage I breast cancer.  The treatment for this type of breast cancer is palliative.  There is no planned surgery at this time.  Past Medical History:  Diagnosis Date  . Allergic genetic state   . Arthritis   . Breast cancer ([place])    2009 infiltrating ductal cancer of right breast  . Breast mass 08/2006, 03/2009   left breast biopsy fibroadenoma (2008) right breast biopsy benign (2010)  . 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 Central serous retinopathy   . Chicken pox   . Depression   . Fatty liver   . Fatty liver   . Gastric polyps   . GERD (gastroesophageal reflux disease)   . Gestational diabetes    prediabetes out side of having baby  . Headache    migraines  . Heart murmur   . History of abnormal mammogram 08/2006,  01/2008, 03/2009  . Hypertension   . IBS (irritable bowel syndrome)   . Joint disorder    hx of left ankle tendonitis, bursitis, heel spur  . Obesity 2018   BMI 45  . Pelvic pain    adhesions and adenomyosis  . Personal history of radiation therapy   . Rosacea     Past Surgical History:  Procedure Laterality Date  . ABDOMINAL HYSTERECTOMY    . BREAST BIOPSY Left 08/2006   benign fibroadenoma  . BREAST BIOPSY Right 08/11/2020   [place] bx of LN, hydromarker, Invasive mammary carcinoma  . BREAST EXCISIONAL BIOPSY Right 02/26/2008   right breast invasive mam ca with rad partial mastectomy   . BREAST SURGERY Right    x2/ Dr Tamala Julian  . CARDIAC CATHETERIZATION  2006  . CESAREAN SECTION  04/02/1998  . CHOLECYSTECTOMY  04/2010   Dr. Smith/ laprascopic surgery  . COLONOSCOPY  04/04/2000  . COLONOSCOPY WITH PROPOFOL N/A 02/12/2019   Procedure: COLONOSCOPY WITH PROPOFOL;  Surgeon: Lollie Sails, MD;  [facility] ENDOSCOPY;  Service: Endoscopy;  Laterality: N/A;  . ESOPHAGOGASTRODUODENOSCOPY (EGD) WITH PROPOFOL N/A 05/01/2015   Procedure: ESOPHAGOGASTRODUODENOSCOPY (EGD) WITH PROPOFOL;  Surgeon: Hulen Luster, MD;  Location: [facility] ENDOSCOPY;  Service: Gastroenterology;  Laterality: N/A;  . ESOPHAGOGASTRODUODENOSCOPY (EGD) WITH PROPOFOL N/A 02/12/2019   Procedure: ESOPHAGOGASTRODUODENOSCOPY (EGD) WITH PROPOFOL;  Surgeon: Lollie Sails, MD;  Location: [facility]  ENDOSCOPY;  Service: Endoscopy;  Laterality: N/A;  . EYE SURGERY  08/2012   laser surgery on retina/ DR Appenzeller  . FINGER SURGERY     repair of tendon in right index, Dr. Francesco Sor in [place]  . FOOT SURGERY     Dr. Milinda Pointer  . IRRIGATION AND DEBRIDEMENT SEBACEOUS CYST     right upper back  . LAPAROSCOPIC SUPRACERVICAL HYSTERECTOMY  06/2007   Dr Kincius/ adhesions/CPP/adenomyosis  . MASTECTOMY PARTIAL / LUMPECTOMY Right 01/2008   with sentinal lymph node biopsy/ infiltrating ductal cancer  . REPAIR PERONEAL TENDONS ANKLE  10/2019    with tarsal exostectomy and sural neuroma excision on right   . SKIN CANCER EXCISION     back and calves  . WISDOM TOOTH EXTRACTION  1991    Current Medications: Current Meds  Medication Sig  . acetaminophen (TYLENOL) 325 MG tablet Take 650 mg by mouth every 6 (six) hours as needed for mild pain or headache.  Marland Kitchen buPROPion (WELLBUTRIN XL) 300 MG 24 hr tablet Take 300 mg by mouth at bedtime.  . citalopram (CELEXA) 40 MG tablet Take 40 mg by mouth at bedtime.  . clotrimazole (LOTRIMIN) 1 % cream Apply 1 application topically daily. groin  . diltiazem (CARDIZEM CD) 120 MG 24 hr capsule Take 1 capsule (120 mg total) by mouth daily.  . diphenhydrAMINE (BENADRYL) 25 MG tablet Take 25 mg by mouth at bedtime as needed for sleep.  Marland Kitchen ketotifen (ZADITOR) 0.025 % ophthalmic solution Place 1 drop into both eyes daily.  Marland Kitchen letrozole (FEMARA) 2.5 MG tablet Take 1 tablet (2.5 mg total) by mouth daily.  Marland Kitchen lisinopril (PRINIVIL,ZESTRIL) 10 MG tablet Take 10 mg by mouth at bedtime.  . Meloxicam 7.5 MG TBDP Take 7.5 mg by mouth daily as needed.  . metFORMIN (GLUCOPHAGE-XR) 500 MG 24 hr tablet Take 500 mg by mouth in the morning and at bedtime.  . Multiple Vitamin (MULTIVITAMIN) tablet Take 1 tablet by mouth daily.  Marland Kitchen omeprazole (PRILOSEC) 20 MG capsule Take 20 mg by mouth daily.  Marland Kitchen oxyCODONE (OXY IR/ROXICODONE) 5 MG immediate release tablet Take 1 tablet (5 mg total) by mouth every 6 (six) hours as needed for severe pain.  . palbociclib (IBRANCE) 125 MG tablet Take 1 tablet (125 mg total) by mouth daily. Take for 21 days on, 7 days off, repeat every 28 days.     Allergies:   Kiwi extract, Codeine, Grapeseed extract [nutritional supplements], Amoxicillin, Erythromycin, Sulfa antibiotics, and Tape   Social History   Socioeconomic History  . Marital status: Married    Spouse name: Jacqueline Deleon  . Number of children: 1  . Years of education: 31  . Highest education level: Not on file  Occupational History  .  Occupation: CMA  Tobacco Use  . Smoking status: Never Smoker  . Smokeless tobacco: Never Used  Vaping Use  . Vaping Use: Never used  Substance and Sexual Activity  . Alcohol use: Yes    Alcohol/week: 0.0 standard drinks    Comment: rare, 1-2 drinks per year  . Drug use: No  . Sexual activity: Yes    Partners: Male    Birth control/protection: Surgical    Comment: Hysterectomy   Other Topics Concern  . Not on file  Social History Narrative   Lives in [place] with husband, no pets.  Works at [employer].      Diet - regular   Exercise - occasional   Social Determinants of Radio broadcast assistant  Strain: Not on file  Food Insecurity: Not on file  Transportation Needs: Not on file  Physical Activity: Not on file  Stress: Not on file  Social Connections: Not on file     Family History: The patient's family history includes Atrial fibrillation in her mother; Breast cancer (age of onset: 73) in her cousin; Breast cancer (age of onset: 41) in her cousin; Breast cancer (age of onset: 51) in her maternal aunt; Breast cancer (age of onset: 108) in her mother; Cancer in her maternal aunt and sister; Cancer (age of onset: 71) in her father; Colon cancer in her cousin; Colon cancer (age of onset: 77) in her mother; Diabetes in her maternal aunt and maternal grandmother; Heart disease in her maternal uncle and paternal grandfather; Hip fracture in her mother; Hypertension in her father, mother, and sister; Lung cancer (age of onset: 60) in her paternal aunt; Lymphoma in her maternal uncle; Parkinson's disease in her father; Skin cancer in her father and mother; Stroke in her father and maternal grandfather; Thyroid cancer in her cousin; Thyroid disease in her mother and sister.  ROS:   Please see the history of present illness.    All other systems reviewed and are negative.  EKGs/Labs/Other Studies Reviewed:    The following studies were reviewed today:  January 28th 2022 echo  personally reviewed Left ventricular function normal, 60% Right ventricular function normal No significant valvular abnormalities  08/07/2020 ECG personally reviewed Atrial flutter with 2:1 AV conduction      Recent Labs: 08/07/2020: Magnesium 2.0; TSH 3.816 08/21/2020: ALT 20; BUN 22; Creatinine, Ser 0.99; Hemoglobin 12.8; Platelets 272; Potassium 4.4; Sodium 139  Recent Lipid Panel    Component Value Date/Time   CHOL 154 03/06/2020 0811   TRIG 103 03/06/2020 0811   HDL 59 03/06/2020 0811   CHOLHDL 2.8 08/24/2019 0815   LDLCALC 76 03/06/2020 0811    Physical Exam:    VS:  BP 120/60 (BP Location: Left Arm, Patient Position: Sitting, Cuff Size: Large)   Pulse 90   Ht 5\' 3"  (1.6 m)   Wt 262 lb (118.8 kg)   SpO2 98%   BMI 46.41 kg/m     Wt Readings from Last 3 Encounters:  08/27/20 262 lb (118.8 kg)  08/15/20 260 lb (117.9 kg)  08/15/20 257 lb 3.2 oz (116.7 kg)     GEN:  Well nourished, well developed in no acute distress HEENT: Normal NECK: No JVD; No carotid bruits LYMPHATICS: No lymphadenopathy CARDIAC: RRR, no murmurs, rubs, gallops RESPIRATORY:  Clear to auscultation without rales, wheezing or rhonchi  ABDOMEN: Soft, non-tender, non-distended MUSCULOSKELETAL:  No edema; No deformity  SKIN: Warm and dry NEUROLOGIC:  Alert and oriented x 3 PSYCHIATRIC:  Normal affect   ASSESSMENT:    1. Atrial flutter with rapid ventricular response ([place])   2. Metastatic breast cancer ([place])   3. 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 Diabetes mellitus without complication ([place])    PLAN:    In order of problems listed above:  1. Atrial Flutter with Rapid ventricular response Ms. Hamed has had 1 episode of symptomatic atrial flutter with rapid ventricular responses.  The episode of atrial flutter awoke the patient from sleep.  She presented to the emergency department where she converted back to normal rhythm with diltiazem.  She has not had a recurrent episode  of atrial arrhythmia.  I do not suspect based on her history that she has had other episodes  of atrial arrhythmias.  We discussed anticoagulation for stroke prophylaxis during our clinic visit today.  Given the single episode of atrial flutter, I do not think that the potential benefit outweigh the potential risks associated with systemic anticoagulation especially in the setting of her recent diagnosis of metastatic breast cancer for which she is starting chemotherapy.  I reached out and spoken to her oncologist, Dr. Janese Banks.  Dr. Janese Banks has advised that the chemotherapy regimen she is taking will lower her platelet counts but does not think that it would be an absolute contraindication to anticoagulation.  After discussing her situation in detail, Ms. Lona and I decided to hold anticoagulation for now and continue diltiazem.  If she has a recurrent episode of atrial flutter, she will reach out to the office and we will start her on systemic anticoagulation for stroke prophylaxis.  At that time we will also discuss other strategies to maintain normal rhythm.   Medication Adjustments/Labs and Tests Ordered: Current medicines are reviewed at length with the patient today.  Concerns regarding medicines are outlined above.  No orders of the defined types were placed in this encounter.  No orders of the defined types were placed in this encounter.    Signed, Lars Mage, MD, [facility]  08/27/2020 9:04 PM    Electrophysiology [facility]

## 2020-08-28 ENCOUNTER — Ambulatory Visit
Admission: RE | Admit: 2020-08-28 | Discharge: 2020-08-28 | Disposition: A | Discharge status: Home / Self Care | Payer: 59 | Source: Ambulatory Visit | Attending: Radiation Oncology | Admitting: Radiation Oncology

## 2020-08-28 ENCOUNTER — Telehealth: Payer: Self-pay | Admitting: Obstetrics and Gynecology

## 2020-08-28 ENCOUNTER — Encounter: Payer: Self-pay | Admitting: Obstetrics and Gynecology

## 2020-08-28 DIAGNOSIS — C50911 Malignant neoplasm of unspecified site of right female breast: Secondary | ICD-10-CM | POA: Diagnosis not present

## 2020-08-28 DIAGNOSIS — C50611 Malignant neoplasm of axillary tail of right female breast: Secondary | ICD-10-CM | POA: Diagnosis not present

## 2020-08-28 DIAGNOSIS — N6321 Unspecified lump in the left breast, upper outer quadrant: Secondary | ICD-10-CM | POA: Diagnosis not present

## 2020-08-28 DIAGNOSIS — Z170 Estrogen receptor positive status [ER+]: Secondary | ICD-10-CM | POA: Diagnosis not present

## 2020-08-28 DIAGNOSIS — Z79810 Long term (current) use of selective estrogen receptor modulators (SERMs): Secondary | ICD-10-CM | POA: Diagnosis not present

## 2020-08-28 DIAGNOSIS — C7951 Secondary malignant neoplasm of bone: Secondary | ICD-10-CM | POA: Insufficient documentation

## 2020-08-28 DIAGNOSIS — Z510 Encounter for antineoplastic radiation therapy: Secondary | ICD-10-CM | POA: Insufficient documentation

## 2020-08-28 NOTE — Telephone Encounter (Signed)
Pt aware of CHEK2 mutation on [facility] MyRisk testing. Pt with hx of breast cancer 2009, with recurrent disease 2022.  Increased risk of colon cancer. Pt's last colonoscopy was 2020, repeat due every 5 yrs, which is what pt has been doing.  Pt with CDH1 VUS.  Recommended sister and son get tested.

## 2020-08-29 ENCOUNTER — Ambulatory Visit: Payer: Self-pay | Admitting: Surgery

## 2020-08-29 LAB — SURGICAL PATHOLOGY

## 2020-09-01 ENCOUNTER — Encounter: Payer: Self-pay | Admitting: Oncology

## 2020-09-02 ENCOUNTER — Other Ambulatory Visit: Payer: Self-pay

## 2020-09-02 ENCOUNTER — Encounter: Payer: Self-pay | Admitting: *Deleted

## 2020-09-02 ENCOUNTER — Ambulatory Visit
Admission: RE | Admit: 2020-09-02 | Discharge: 2020-09-02 | Disposition: A | Discharge status: Home / Self Care | Payer: 59 | Source: Ambulatory Visit | Attending: Radiation Oncology | Admitting: Radiation Oncology

## 2020-09-02 ENCOUNTER — Ambulatory Visit
Admission: RE | Admit: 2020-09-02 | Discharge: 2020-09-02 | Disposition: A | Discharge status: Home / Self Care | Payer: 59 | Source: Ambulatory Visit | Attending: Oncology | Admitting: Oncology

## 2020-09-02 DIAGNOSIS — C7951 Secondary malignant neoplasm of bone: Secondary | ICD-10-CM | POA: Diagnosis not present

## 2020-09-02 DIAGNOSIS — I639 Cerebral infarction, unspecified: Secondary | ICD-10-CM | POA: Diagnosis not present

## 2020-09-02 DIAGNOSIS — J341 Cyst and mucocele of nose and nasal sinus: Secondary | ICD-10-CM | POA: Diagnosis not present

## 2020-09-02 DIAGNOSIS — M2548 Effusion, other site: Secondary | ICD-10-CM | POA: Diagnosis not present

## 2020-09-02 DIAGNOSIS — C50919 Malignant neoplasm of unspecified site of unspecified female breast: Secondary | ICD-10-CM | POA: Diagnosis not present

## 2020-09-02 DIAGNOSIS — H748X3 Other specified disorders of middle ear and mastoid, bilateral: Secondary | ICD-10-CM | POA: Diagnosis not present

## 2020-09-02 DIAGNOSIS — Z170 Estrogen receptor positive status [ER+]: Secondary | ICD-10-CM | POA: Diagnosis not present

## 2020-09-02 DIAGNOSIS — C50611 Malignant neoplasm of axillary tail of right female breast: Secondary | ICD-10-CM | POA: Diagnosis not present

## 2020-09-02 MED ORDER — GADOBUTROL 1 MMOL/ML IV SOLN
10.0000 mL | Freq: Once | INTRAVENOUS | Status: AC | PRN
  Administered 2020-09-02: 10 mL via INTRAVENOUS

## 2020-09-03 ENCOUNTER — Ambulatory Visit
Admission: RE | Admit: 2020-09-03 | Discharge: 2020-09-03 | Disposition: A | Discharge status: Home / Self Care | Payer: 59 | Source: Ambulatory Visit | Attending: Radiation Oncology | Admitting: Radiation Oncology

## 2020-09-03 DIAGNOSIS — Z510 Encounter for antineoplastic radiation therapy: Secondary | ICD-10-CM | POA: Diagnosis not present

## 2020-09-03 DIAGNOSIS — C50911 Malignant neoplasm of unspecified site of right female breast: Secondary | ICD-10-CM | POA: Diagnosis not present

## 2020-09-03 DIAGNOSIS — C773 Secondary and unspecified malignant neoplasm of axilla and upper limb lymph nodes: Secondary | ICD-10-CM | POA: Diagnosis not present

## 2020-09-03 DIAGNOSIS — C7951 Secondary malignant neoplasm of bone: Secondary | ICD-10-CM | POA: Diagnosis not present

## 2020-09-04 ENCOUNTER — Encounter: Payer: Self-pay | Admitting: Oncology

## 2020-09-04 ENCOUNTER — Ambulatory Visit
Admission: RE | Admit: 2020-09-04 | Discharge: 2020-09-04 | Disposition: A | Discharge status: Home / Self Care | Payer: 59 | Source: Ambulatory Visit | Attending: Radiation Oncology | Admitting: Radiation Oncology

## 2020-09-04 DIAGNOSIS — Z510 Encounter for antineoplastic radiation therapy: Secondary | ICD-10-CM | POA: Diagnosis not present

## 2020-09-04 DIAGNOSIS — C7951 Secondary malignant neoplasm of bone: Secondary | ICD-10-CM | POA: Diagnosis not present

## 2020-09-05 ENCOUNTER — Ambulatory Visit
Admission: RE | Admit: 2020-09-05 | Discharge: 2020-09-05 | Disposition: A | Discharge status: Home / Self Care | Payer: 59 | Source: Ambulatory Visit | Attending: Radiation Oncology | Admitting: Radiation Oncology

## 2020-09-05 DIAGNOSIS — Z510 Encounter for antineoplastic radiation therapy: Secondary | ICD-10-CM | POA: Diagnosis not present

## 2020-09-05 DIAGNOSIS — C7951 Secondary malignant neoplasm of bone: Secondary | ICD-10-CM | POA: Diagnosis not present

## 2020-09-08 ENCOUNTER — Ambulatory Visit
Admission: RE | Admit: 2020-09-08 | Discharge: 2020-09-08 | Disposition: A | Discharge status: Home / Self Care | Payer: 59 | Source: Ambulatory Visit | Attending: Radiation Oncology | Admitting: Radiation Oncology

## 2020-09-08 ENCOUNTER — Other Ambulatory Visit (HOSPITAL_COMMUNITY): Payer: Self-pay | Admitting: Family Medicine

## 2020-09-08 DIAGNOSIS — Z510 Encounter for antineoplastic radiation therapy: Secondary | ICD-10-CM | POA: Diagnosis not present

## 2020-09-08 DIAGNOSIS — C7951 Secondary malignant neoplasm of bone: Secondary | ICD-10-CM | POA: Diagnosis not present

## 2020-09-08 MED FILL — LISINOPRIL 10 MG TABS: 10 | 90 days supply | Qty: 90 | Fill #0

## 2020-09-08 MED FILL — metFORMIN HCL ER 500 MG TB2: 500 | 90 days supply | Qty: 180 | Fill #0

## 2020-09-09 ENCOUNTER — Ambulatory Visit
Admission: RE | Admit: 2020-09-09 | Discharge: 2020-09-09 | Disposition: A | Discharge status: Home / Self Care | Payer: 59 | Source: Ambulatory Visit | Attending: Radiation Oncology | Admitting: Radiation Oncology

## 2020-09-09 ENCOUNTER — Encounter: Payer: Self-pay | Admitting: Pharmacist

## 2020-09-09 ENCOUNTER — Inpatient Hospital Stay: Payer: 59 | Attending: Radiation Oncology

## 2020-09-09 ENCOUNTER — Inpatient Hospital Stay: Payer: 59 | Admitting: Pharmacist

## 2020-09-09 DIAGNOSIS — C50911 Malignant neoplasm of unspecified site of right female breast: Secondary | ICD-10-CM | POA: Diagnosis not present

## 2020-09-09 DIAGNOSIS — C50919 Malignant neoplasm of unspecified site of unspecified female breast: Secondary | ICD-10-CM

## 2020-09-09 DIAGNOSIS — Z170 Estrogen receptor positive status [ER+]: Secondary | ICD-10-CM | POA: Insufficient documentation

## 2020-09-09 DIAGNOSIS — C7951 Secondary malignant neoplasm of bone: Secondary | ICD-10-CM | POA: Insufficient documentation

## 2020-09-09 DIAGNOSIS — Z510 Encounter for antineoplastic radiation therapy: Secondary | ICD-10-CM | POA: Insufficient documentation

## 2020-09-09 LAB — CBC WITH DIFFERENTIAL/PLATELET
Abs Immature Granulocytes: 0.02 10*3/uL (ref 0.00–0.07)
Basophils Absolute: 0 10*3/uL (ref 0.0–0.1)
Basophils Relative: 1 %
Eosinophils Absolute: 0.1 10*3/uL (ref 0.0–0.5)
Eosinophils Relative: 2 %
HCT: 37.9 % (ref 36.0–46.0)
Hemoglobin: 11.7 g/dL — ABNORMAL LOW (ref 12.0–15.0)
Immature Granulocytes: 1 %
Lymphocytes Relative: 42 %
Lymphs Abs: 1.1 10*3/uL (ref 0.7–4.0)
MCH: 25.1 pg — ABNORMAL LOW (ref 26.0–34.0)
MCHC: 30.9 g/dL (ref 30.0–36.0)
MCV: 81.2 fL (ref 80.0–100.0)
Monocytes Absolute: 0.2 10*3/uL (ref 0.1–1.0)
Monocytes Relative: 7 %
Neutro Abs: 1.2 10*3/uL — ABNORMAL LOW (ref 1.7–7.7)
Neutrophils Relative %: 47 %
Platelets: 189 10*3/uL (ref 150–400)
RBC: 4.67 MIL/uL (ref 3.87–5.11)
RDW: 19.7 % — ABNORMAL HIGH (ref 11.5–15.5)
Smear Review: NORMAL
WBC: 2.6 10*3/uL — ABNORMAL LOW (ref 4.0–10.5)
nRBC: 0 % (ref 0.0–0.2)

## 2020-09-09 LAB — COMPREHENSIVE METABOLIC PANEL
ALT: 26 U/L (ref 0–44)
AST: 16 U/L (ref 15–41)
Albumin: 4 g/dL (ref 3.5–5.0)
Alkaline Phosphatase: 120 U/L (ref 38–126)
Anion gap: 11 (ref 5–15)
BUN: 18 mg/dL (ref 6–20)
CO2: 26 mmol/L (ref 22–32)
Calcium: 8.9 mg/dL (ref 8.9–10.3)
Chloride: 103 mmol/L (ref 98–111)
Creatinine, Ser: 0.9 mg/dL (ref 0.44–1.00)
GFR, Estimated: >60 mL/min (ref >60)
Glucose, Bld: 109 mg/dL — ABNORMAL HIGH (ref 70–99)
Potassium: 4.5 mmol/L (ref 3.5–5.1)
Sodium: 140 mmol/L (ref 135–145)
Total Bilirubin: 0.6 mg/dL (ref 0.3–1.2)
Total Protein: 7.2 g/dL (ref 6.5–8.1)

## 2020-09-09 MED FILL — DILTIAZEM HCL ER COATED BEA: 120 | 30 days supply | Qty: 30 | Fill #0

## 2020-09-09 MED FILL — AMLODIPINE BESYLATE 10 MG T: 10 | 90 days supply | Qty: 90 | Fill #1

## 2020-09-09 NOTE — Progress Notes (Signed)
Oral Chemotherapy Clinic [facility]  Telephone:([phone] [phone]  Patient Care Team: Kandyce Rud, MD as PCP - General (Family Medicine) Lanier Prude, MD as PCP - Electrophysiology (Cardiology)   Name of the patient: Jacqueline Deleon  [id number]  [birth year]   Date of visit: 09/09/20  HPI: Patient is a 60 y.o. female with a newly diagnosed ER/PR positive, HER2 negative breast cancer. Planned treatment with Ibrance (palbociclib) and letrozole. Ibrance started on 08/26/20.  Reason for Consult: Oral chemotherapy follow-up for palbociclib therapy.   PAST MEDICAL HISTORY: Past Medical History:  Diagnosis Date  . Allergic genetic state   . Arthritis   . BRCA negative 2004  . Breast cancer (HCC)    2009 infiltrating ductal cancer of right breast  . Breast mass 08/2006, 03/2009   left breast biopsy fibroadenoma (2008) right breast biopsy benign (2010)  . 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 Central serous retinopathy   . 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 Chicken pox   . Depression   . Fatty liver   . Fatty liver   . Gastric polyps   . GERD (gastroesophageal reflux disease)   . Gestational diabetes    prediabetes out side of having baby  . Headache    migraines  . Heart murmur   . History of abnormal mammogram 08/2006, 01/2008, 03/2009  . Hypertension   . IBS (irritable bowel syndrome)   . Joint disorder    hx of left ankle tendonitis, bursitis, heel spur  . 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 Obesity 2018   BMI 45  . Pelvic pain    adhesions and adenomyosis  . Personal history of radiation therapy   . Rosacea     PAST SURGICAL HISTORY:  Past Surgical History:  Procedure Laterality Date  . ABDOMINAL HYSTERECTOMY    . BREAST BIOPSY Left 08/2006   benign fibroadenoma  . BREAST BIOPSY Right 08/11/2020   [place] bx of LN, hydromarker, Invasive  mammary carcinoma  . BREAST EXCISIONAL BIOPSY Right 02/26/2008   right breast invasive mam ca with rad partial mastectomy   . BREAST SURGERY Right    x2/ Dr Katrinka Blazing  . CARDIAC CATHETERIZATION  2006  . CESAREAN SECTION  04/02/1998  . CHOLECYSTECTOMY  04/2010   Dr. Smith/ laprascopic surgery  . COLONOSCOPY  04/04/2000  . COLONOSCOPY WITH PROPOFOL N/A 02/12/2019   Procedure: COLONOSCOPY WITH PROPOFOL;  Surgeon: Christena Deem, MD;  Location: [facility] ENDOSCOPY;  Service: Endoscopy;  Laterality: N/A;  . ESOPHAGOGASTRODUODENOSCOPY (EGD) WITH PROPOFOL N/A 05/01/2015   Procedure: ESOPHAGOGASTRODUODENOSCOPY (EGD) WITH PROPOFOL;  Surgeon: Wallace Cullens, MD;  Location: [facility] ENDOSCOPY;  Service: Gastroenterology;  Laterality: N/A;  . ESOPHAGOGASTRODUODENOSCOPY (EGD) WITH PROPOFOL N/A 02/12/2019   Procedure: ESOPHAGOGASTRODUODENOSCOPY (EGD) WITH PROPOFOL;  Surgeon: Christena Deem, MD;  Location: [facility] ENDOSCOPY;  Service: Endoscopy;  Laterality: N/A;  . EYE SURGERY  08/2012   laser surgery on retina/ DR Appenzeller  . FINGER SURGERY     repair of tendon in right index, Dr. Benay Pillow in [place]  . FOOT SURGERY     Dr. Al Corpus  . IRRIGATION AND DEBRIDEMENT SEBACEOUS CYST     right upper back  . LAPAROSCOPIC SUPRACERVICAL HYSTERECTOMY  06/2007   Dr Kincius/ adhesions/CPP/adenomyosis  . MASTECTOMY PARTIAL / LUMPECTOMY Right 01/2008   with sentinal lymph node biopsy/ infiltrating ductal cancer  .  REPAIR PERONEAL TENDONS ANKLE  10/2019   with tarsal exostectomy and sural neuroma excision on right   . SKIN CANCER EXCISION     back and calves  . WISDOM TOOTH EXTRACTION  1991    HEMATOLOGY/ONCOLOGY HISTORY:  Oncology History  Metastatic breast cancer ([place])  08/19/2020 Cancer Staging   Staging form: Breast, AJCC 8th Edition - Clinical stage from 08/19/2020: Stage IV (rcTX, cNX, cM1, G3, ER+, PR+, HER2-) - Signed by Sindy Guadeloupe, MD on 08/23/2020 Stage prefix: Recurrence   08/23/2020 Initial Diagnosis    Metastatic breast cancer ([place])   08/23/2020 -  Chemotherapy    Patient is on Treatment Plan: BREAST PALBOCICLIB / LETROZOLE Q28D        ALLERGIES:  is allergic to kiwi extract, codeine, grapeseed extract [nutritional supplements], amoxicillin, erythromycin, sulfa antibiotics, and tape.  MEDICATIONS:  Current Outpatient Medications  Medication Sig Dispense Refill  . acetaminophen (TYLENOL) 325 MG tablet Take 650 mg by mouth every 6 (six) hours as needed for mild pain or headache.    Marland Kitchen buPROPion (WELLBUTRIN XL) 300 MG 24 hr tablet Take 300 mg by mouth at bedtime.    . citalopram (CELEXA) 40 MG tablet Take 40 mg by mouth at bedtime.    . clotrimazole (LOTRIMIN) 1 % cream Apply 1 application topically daily. groin    . diltiazem (CARDIZEM CD) 120 MG 24 hr capsule Take 1 capsule (120 mg total) by mouth daily. 30 capsule 11  . diphenhydrAMINE (BENADRYL) 25 MG tablet Take 25 mg by mouth at bedtime as needed for sleep.    Marland Kitchen ketotifen (ZADITOR) 0.025 % ophthalmic solution Place 1 drop into both eyes daily.    Marland Kitchen letrozole (FEMARA) 2.5 MG tablet Take 1 tablet (2.5 mg total) by mouth daily. 90 tablet 4  . lisinopril (PRINIVIL,ZESTRIL) 10 MG tablet Take 10 mg by mouth at bedtime.    . Meloxicam 7.5 MG TBDP Take 7.5 mg by mouth daily as needed. 30 tablet 0  . metFORMIN (GLUCOPHAGE-XR) 500 MG 24 hr tablet Take 500 mg by mouth in the morning and at bedtime.    . Multiple Vitamin (MULTIVITAMIN) tablet Take 1 tablet by mouth daily.    Marland Kitchen omeprazole (PRILOSEC) 20 MG capsule Take 20 mg by mouth daily.    Marland Kitchen oxyCODONE (OXY IR/ROXICODONE) 5 MG immediate release tablet Take 1 tablet (5 mg total) by mouth every 6 (six) hours as needed for severe pain. 60 tablet 0  . palbociclib (IBRANCE) 125 MG tablet Take 1 tablet (125 mg total) by mouth daily. Take for 21 days on, 7 days off, repeat every 28 days. 21 tablet 5   No current facility-administered medications for this visit.    VITAL SIGNS: There were no vitals  taken for this visit. There were no vitals filed for this visit.  Estimated body mass index is 46.41 kg/m as calculated from the following:   Height as of 08/27/20: $RemoveBef'5\' 3"'OWJiIRelWK$  (1.6 m).   Weight as of 08/27/20: 118.8 kg (262 lb).  LABS: CBC:    Component Value Date/Time   WBC 9.2 08/21/2020 1600   HGB 12.8 08/21/2020 1600   HGB 11.9 03/06/2020 0811   HCT 40.8 08/21/2020 1600   HCT 38.6 03/06/2020 0811   PLT 272 08/21/2020 1600   PLT 253 03/06/2020 0811   MCV 77.4 (L) 08/21/2020 1600   MCV 80 03/06/2020 0811   NEUTROABS 6.1 08/21/2020 1600   NEUTROABS 4.7 03/06/2020 0811   LYMPHSABS 2.0 08/21/2020 1600  LYMPHSABS 1.6 03/06/2020 0811   MONOABS 0.9 08/21/2020 1600   EOSABS 0.2 08/21/2020 1600   EOSABS 0.1 03/06/2020 0811   BASOSABS 0.0 08/21/2020 1600   BASOSABS 0.0 03/06/2020 0811   Comprehensive Metabolic Panel:    Component Value Date/Time   NA 139 08/21/2020 1600   NA 141 03/06/2020 0811   K 4.4 08/21/2020 1600   CL 103 08/21/2020 1600   CO2 26 08/21/2020 1600   BUN 22 (H) 08/21/2020 1600   BUN 18 03/06/2020 0811   CREATININE 0.99 08/21/2020 1600   GLUCOSE 89 08/21/2020 1600   CALCIUM 9.3 08/21/2020 1600   AST 18 08/21/2020 1600   ALT 20 08/21/2020 1600   ALKPHOS 123 08/21/2020 1600   BILITOT 0.4 08/21/2020 1600   BILITOT <0.2 03/06/2020 0811   PROT 7.8 08/21/2020 1600   PROT 6.6 03/06/2020 0811   ALBUMIN 4.1 08/21/2020 1600   ALBUMIN 4.2 03/06/2020 0811    RADIOGRAPHIC STUDIES: MR Brain W Wo Contrast  Result Date: 09/02/2020 CLINICAL DATA:  Breast cancer, staging. EXAM: MRI HEAD WITHOUT AND WITH CONTRAST TECHNIQUE: Multiplanar, multiecho pulse sequences of the brain and surrounding structures were obtained without and with intravenous contrast. CONTRAST:  45mL GADAVIST GADOBUTROL 1 MMOL/ML IV SOLN COMPARISON:  None. FINDINGS: Brain: No acute infarction, hemorrhage, hydrocephalus, extra-axial collection or mass lesion. 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 Scattered foci of T2 hyperintensity are seen within the white matter of cerebral hemispheres, nonspecific. No focus of abnormal contrast enhancement. Vascular: Normal flow voids. Skull and upper cervical spine: Normal marrow signal. Sinuses/Orbits: Small mucous retention cyst in the left maxillary sinus. The orbits are maintained. Other: Small right mastoid effusion. IMPRESSION: 1. No evidence of intracranial metastatic disease. 2. 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 3. Scattered foci of T2 hyperintensity within the white matter of the cerebral hemispheres, nonspecific but may represent mild chronic microvascular ischemic changes. Electronically Signed   By: Pedro Earls M.D.   On: 09/02/2020 12:19   NM PET Image Initial (PI) Skull Base To Thigh  Result Date: 08/18/2020 CLINICAL DATA:  Initial treatment strategy for right breast cancer. Biopsy-proven invasive mammary carcinoma on right axillary mass biopsy 08/11/2020. History of stage I right breast cancer in 2009 status post conservation therapy. Chronic left hip pain for months with no reported injury. EXAM: NUCLEAR MEDICINE PET SKULL BASE TO THIGH TECHNIQUE: 13.8 mCi F-18 FDG was injected intravenously. Full-ring PET imaging was performed from the skull base to thigh after the radiotracer. CT data was obtained and used for attenuation correction and anatomic localization. Fasting blood glucose: 92 mg/dl COMPARISON:  07/22/2020 chest CT angiogram. 08/19/2019 MRI abdomen. 05/22/2012 CT abdomen/pelvis. 05/21/2011 PET-CT. FINDINGS: Mediastinal blood pool activity: SUV max 2.5 Liver activity: SUV max NA NECK: No hypermetabolic lymph nodes in the neck. Incidental CT findings: Small mucous retention cyst versus polyp in the inferior left maxillary sinus. CHEST: Hypermetabolic poorly marginated solid 2.5 x 2.0 cm right axillary mass with max SUV 5.6 (series 3/image 75). No enlarged or hypermetabolic mediastinal or  hilar lymph nodes. No enlarged or hypermetabolic left axillary lymph nodes. No hypermetabolic pulmonary findings. Incidental CT findings: Anterior right lower lobe 4 mm solid pulmonary nodule (series 3/image 92), stable since 2012 PET-CT and considered benign. Posterior right middle lobe 6 mm solid pulmonary nodule (series 3/image 102) and basilar left lower lobe 7 mm solid pulmonary nodule (series 3/image 113), both stable since recent 07/22/2020 chest CT angiogram study, not definitely  seen on 2012 PET-CT, below PET resolution. Postsurgical changes from remote lumpectomy in the right breast. ABDOMEN/PELVIS: No abnormal hypermetabolic activity within the pancreas, adrenal glands, or spleen. No hypermetabolic lymph nodes in the abdomen or pelvis. Subtle small focus of hypermetabolism in inferior right liver with max SUV 6.0, without definite CT correlate, equivocal for liver metastasis. Incidental CT findings: Cholecystectomy. 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 Mild sigmoid diverticulosis. SKELETON: Multiple hypermetabolic faintly lytic osseous lesions throughout lumbar spine and bilateral pelvic girdle, with representative lesions as follows: -supra-acetabular right iliac bone 1.9 cm lesion with max SUV 6.7 (series 3/image 215) -anterior superior left acetabular 1.7 cm lesion with max SUV 7.8 (series 3/image 222) -posterior right L1 vertebral 1.2 cm lesion with max SUV 5.6 (series 3/image 149) Incidental CT findings: none IMPRESSION: 1. Hypermetabolic poorly marginated solid 2.5 cm right axillary mass compatible with known biopsy-proven malignancy (either right axillary tail primary neoplasm or infiltrative right axillary lymph node metastasis). 2. 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 3. Subtle small focus of hypermetabolism in the inferior right liver without definite CT correlate, equivocal for liver metastasis.  MRI abdomen without with IV contrast may be obtained for further evaluation if clinically warranted. 4. Solid 6 mm right middle lobe and 7 mm left lower lobe pulmonary nodules, below PET resolution, stable since recent 07/22/2020 chest CT angiogram study, not definitely seen on 2012 PET-CT, warranting continued chest CT surveillance in 3-6 months. 5. Chronic findings include: Tiny upper left renal angiomyolipoma. Mild sigmoid diverticulosis. Electronically Signed   By: Ilona Sorrel M.D.   On: 08/18/2020 16:14   US BREAST LTD UNI RIGHT INC AXILLA  Result Date: 08/20/2020 CLINICAL DATA:  60 year old female with biopsy proven invasive mammary carcinoma involving a right axillary mass. Patient has a history of remote stage I breast cancer, treated in 2009. History of remote excisional left breast biopsy for a fibroadenoma. EXAM: DIGITAL DIAGNOSTIC BILATERAL MAMMOGRAM WITH TOMOSYNTHESIS AND CAD; ULTRASOUND RIGHT BREAST LIMITED TECHNIQUE: Bilateral digital diagnostic mammography and breast tomosynthesis was performed. The images were evaluated with computer-aided detection.; Targeted ultrasound examination of the right breast was performed. COMPARISON:  Previous exam(s). ACR Breast Density Category b: There are scattered areas of fibroglandular density. FINDINGS: Stable postsurgical changes noted in the bilateral breasts. No new or suspicious mammographic findings in either breast. Targeted ultrasound is performed, read demonstrating an irregular, hypoechoic mass in the deep right axilla. It measures 2.6 x 2.0 x 1.9 cm (previously 2.8 x 1.8 x 1.6 cm). IMPRESSION: 1. No mammographic evidence of malignancy in either breast. 2. Grossly stable appearance of a right axillary mass, consistent with the patient's biopsy-proven site of malignancy. RECOMMENDATION: Per clinical treatment plan. I have discussed the findings and recommendations with the patient. If applicable, a reminder letter will be sent to the patient regarding  the next appointment. BI-RADS CATEGORY  6: Known biopsy-proven malignancy. Electronically Signed   By: Kristopher Oppenheim M.D.   On: 08/20/2020 12:06   MM DIAG BREAST TOMO BILATERAL  Result Date: 08/20/2020 CLINICAL DATA:  60 year old female with biopsy proven invasive mammary carcinoma involving a right axillary mass. Patient has a history of remote stage I breast cancer, treated in 2009. History of remote excisional left breast biopsy for a fibroadenoma. EXAM: DIGITAL DIAGNOSTIC BILATERAL MAMMOGRAM WITH TOMOSYNTHESIS AND CAD; ULTRASOUND RIGHT BREAST LIMITED TECHNIQUE: Bilateral digital diagnostic mammography and breast tomosynthesis was performed. The images were evaluated with computer-aided detection.;  Targeted ultrasound examination of the right breast was performed. COMPARISON:  Previous exam(s). ACR Breast Density Category b: There are scattered areas of fibroglandular density. FINDINGS: Stable postsurgical changes noted in the bilateral breasts. No new or suspicious mammographic findings in either breast. Targeted ultrasound is performed, read demonstrating an irregular, hypoechoic mass in the deep right axilla. It measures 2.6 x 2.0 x 1.9 cm (previously 2.8 x 1.8 x 1.6 cm). IMPRESSION: 1. No mammographic evidence of malignancy in either breast. 2. Grossly stable appearance of a right axillary mass, consistent with the patient's biopsy-proven site of malignancy. RECOMMENDATION: Per clinical treatment plan. I have discussed the findings and recommendations with the patient. If applicable, a reminder letter will be sent to the patient regarding the next appointment. BI-RADS CATEGORY  6: Known biopsy-proven malignancy. Electronically Signed   By: Kristopher Oppenheim M.D.   On: 08/20/2020 12:06   MM CLIP PLACEMENT RIGHT  Result Date: 08/11/2020 CLINICAL DATA:  Status post ultrasound-guided core biopsy of RIGHT axillary mass. EXAM: DIAGNOSTIC RIGHT MAMMOGRAM POST ULTRASOUND BIOPSY COMPARISON:  Previous exam(s).  FINDINGS: Mammographic images were obtained following ultrasound guided biopsy of RIGHT axillary mass and placement of a butterfly HydroMARK shaped clip. The biopsy marking clip is in expected position at the site of biopsy. IMPRESSION: Appropriate positioning of the butterfly HydroMARK shaped biopsy marking clip at the site of biopsy in the location. Final Assessment: Post Procedure Mammograms for Marker Placement Electronically Signed   By: Nolon Nations M.D.   On: 08/11/2020 08:58   DG HIP UNILAT WITH PELVIS 2-3 VIEWS LEFT  Result Date: 08/16/2020 CLINICAL DATA:  Chronic pain in the left hip. EXAM: DG HIP (WITH OR WITHOUT PELVIS) 2-3V LEFT COMPARISON:  None. FINDINGS: There is no evidence of hip fracture or dislocation. Mild osteoarthritic changes of bilateral hips with subchondral sclerosis of the acetabula and minimal osteophytosis. Soft tissues are normal. IMPRESSION: 1. No acute fracture or dislocation identified about the left hip. 2. Mild osteoarthritic changes of bilateral hips. Electronically Signed   By: Fidela Salisbury M.D.   On: 08/16/2020 16:17   [place] RT BREAST BX W LOC DEV 1ST LESION IMG BX SPEC US GUIDE  Addendum Date: 08/15/2020   ADDENDUM REPORT: 08/15/2020 14:13 ADDENDUM: PATHOLOGY revealed: A. SOFT TISSUE MASS, RIGHT AXILLA; ULTRASOUND-GUIDED CORE BIOPSY: - INVASIVE MAMMARY CARCINOMA. Grade 3. Comment: Invasive carcinoma involves nearly the full length of the cores, and measures 16 mm in this sample. No carcinoma in situ is identified. The carcinoma shows absence of tubule/gland formation and moderate nuclear pleomorphism. The mitotic rate is high, 18 per 10 high power fields (field diameter 0.55 mm). Pathology results are CONCORDANT with imaging findings, per Dr. Nolon Nations. Pathology results and recommendations below were discussed with patient by telephone on 08/13/2020. Patient reported biopsy site within normal limits with slight tenderness at the site. Post biopsy care  instructions were reviewed, questions were answered and my direct phone number was provided to patient. Patient was instructed to call [facility] if any concerns or questions arise related to the biopsy. Recommend surgical and oncological consultation: Request for surgical consultation relayed to Al Pimple RN and Tanya Nones RN at [facility] by Electa Sniff RN on 08/13/2020. Pathology results reported by Electa Sniff RN on 08/15/2020. Electronically Signed   By: Nolon Nations M.D.   On: 08/15/2020 14:13   Result Date: 08/15/2020 CLINICAL DATA:  Patient presents for ultrasound-guided core biopsy of RIGHT axillary mass. EXAM: ULTRASOUND GUIDED RIGHT AXILLARY  CORE NEEDLE BIOPSY COMPARISON:  Previous exam(s). PROCEDURE: I met with the patient and we discussed the procedure of ultrasound-guided biopsy, including benefits and alternatives. We discussed the high likelihood of a successful procedure. We discussed the risks of the procedure, including infection, bleeding, tissue injury, clip migration, and inadequate sampling. Informed written consent was given. The usual time-out protocol was performed immediately prior to the procedure. Lesion quadrant: RIGHT axilla Using sterile technique and 1% Lidocaine as local anesthetic, under direct ultrasound visualization, a 14 gauge spring-loaded device was used to perform biopsy of RIGHT axillary mass using a LATERAL to MEDIAL approach. At the conclusion of the procedure butterfly HydroMARK tissue marker clip was deployed into the biopsy cavity. Follow up 2 view mammogram was performed and dictated separately. IMPRESSION: Ultrasound guided biopsy of RIGHT axillary mass. No apparent complications. Electronically Signed: By: Nolon Nations M.D. On: 08/11/2020 08:56     Assessment and Plan-  CMP and CBC from today reviewed, ANC 1.2K/uL and AST/ALT within normal limits. Plan to continue palbociclib. Today is C1D15   Oral Chemotherapy Side  Effect/Intolerance: Following side effects reviewed: hair loss, rash, diarrhea or constipation, nausea, and fatigue.  - Patient did not endorse any of the above side effect other than slight fatigue which was described as "not too bad"  Oral Chemotherapy Adherence: no missed doses reported  Medication Access Issues: no issues, patient fills her medication at [facility]  Other: Mrs. Pho did report that her hip was feeling better since starting radiation treatments. She started to feel an improvement about 3 treatments in.  No patient barriers to medication adherence identified.   Patient expressed understanding and was in agreement with this plan. She also understands that She can call clinic at any time with any questions, concerns, or complaints.   Thank you for allowing me to participate in the care of this very pleasant patient.   Time Total: 15 mins  Visit consisted of counseling and education on dealing with issues of symptom management in the setting of serious and potentially life-threatening illness.Greater than 50%  of this time was spent counseling and coordinating care related to the above assessment and plan.  Signed by: Darl Pikes, PharmD, BCPS, Salley Slaughter, CPP Hematology/Oncology Clinical Pharmacist Practitioner [facility]/HP/AP [facility] [phone]  09/09/2020 9:26 AM

## 2020-09-10 ENCOUNTER — Encounter: Payer: Self-pay | Admitting: Oncology

## 2020-09-10 ENCOUNTER — Ambulatory Visit
Admission: RE | Admit: 2020-09-10 | Discharge: 2020-09-10 | Disposition: A | Discharge status: Home / Self Care | Payer: 59 | Source: Ambulatory Visit | Attending: Radiation Oncology | Admitting: Radiation Oncology

## 2020-09-10 DIAGNOSIS — C7951 Secondary malignant neoplasm of bone: Secondary | ICD-10-CM | POA: Diagnosis not present

## 2020-09-10 DIAGNOSIS — Z510 Encounter for antineoplastic radiation therapy: Secondary | ICD-10-CM | POA: Diagnosis not present

## 2020-09-10 DIAGNOSIS — Z170 Estrogen receptor positive status [ER+]: Secondary | ICD-10-CM | POA: Diagnosis not present

## 2020-09-10 DIAGNOSIS — C50911 Malignant neoplasm of unspecified site of right female breast: Secondary | ICD-10-CM | POA: Diagnosis not present

## 2020-09-11 ENCOUNTER — Encounter: Payer: Self-pay | Admitting: Oncology

## 2020-09-11 ENCOUNTER — Ambulatory Visit
Admission: RE | Admit: 2020-09-11 | Discharge: 2020-09-11 | Disposition: A | Discharge status: Home / Self Care | Payer: 59 | Source: Ambulatory Visit | Attending: Radiation Oncology | Admitting: Radiation Oncology

## 2020-09-11 DIAGNOSIS — Z170 Estrogen receptor positive status [ER+]: Secondary | ICD-10-CM | POA: Diagnosis not present

## 2020-09-11 DIAGNOSIS — Z510 Encounter for antineoplastic radiation therapy: Secondary | ICD-10-CM | POA: Diagnosis not present

## 2020-09-11 DIAGNOSIS — C7951 Secondary malignant neoplasm of bone: Secondary | ICD-10-CM | POA: Diagnosis not present

## 2020-09-11 DIAGNOSIS — C50911 Malignant neoplasm of unspecified site of right female breast: Secondary | ICD-10-CM | POA: Diagnosis not present

## 2020-09-12 ENCOUNTER — Ambulatory Visit
Admission: RE | Admit: 2020-09-12 | Discharge: 2020-09-12 | Disposition: A | Discharge status: Home / Self Care | Payer: 59 | Source: Ambulatory Visit | Attending: Radiation Oncology | Admitting: Radiation Oncology

## 2020-09-12 DIAGNOSIS — C50911 Malignant neoplasm of unspecified site of right female breast: Secondary | ICD-10-CM | POA: Diagnosis not present

## 2020-09-12 DIAGNOSIS — Z170 Estrogen receptor positive status [ER+]: Secondary | ICD-10-CM | POA: Diagnosis not present

## 2020-09-12 DIAGNOSIS — C7951 Secondary malignant neoplasm of bone: Secondary | ICD-10-CM | POA: Diagnosis not present

## 2020-09-12 DIAGNOSIS — Z510 Encounter for antineoplastic radiation therapy: Secondary | ICD-10-CM | POA: Diagnosis not present

## 2020-09-15 ENCOUNTER — Ambulatory Visit
Admission: RE | Admit: 2020-09-15 | Discharge: 2020-09-15 | Disposition: A | Discharge status: Home / Self Care | Payer: 59 | Source: Ambulatory Visit | Attending: Radiation Oncology | Admitting: Radiation Oncology

## 2020-09-15 ENCOUNTER — Encounter: Payer: Self-pay | Admitting: Pharmacist

## 2020-09-15 DIAGNOSIS — Z170 Estrogen receptor positive status [ER+]: Secondary | ICD-10-CM | POA: Diagnosis not present

## 2020-09-15 DIAGNOSIS — C50611 Malignant neoplasm of axillary tail of right female breast: Secondary | ICD-10-CM | POA: Diagnosis not present

## 2020-09-15 DIAGNOSIS — C50911 Malignant neoplasm of unspecified site of right female breast: Secondary | ICD-10-CM | POA: Diagnosis not present

## 2020-09-15 DIAGNOSIS — Z510 Encounter for antineoplastic radiation therapy: Secondary | ICD-10-CM | POA: Diagnosis not present

## 2020-09-15 DIAGNOSIS — C7951 Secondary malignant neoplasm of bone: Secondary | ICD-10-CM | POA: Diagnosis not present

## 2020-09-17 ENCOUNTER — Encounter: Payer: Self-pay | Admitting: Oncology

## 2020-09-18 MED FILL — IBRANCE 125 MG TABS: 125 | 28 days supply | Qty: 21 | Fill #1

## 2020-09-23 ENCOUNTER — Other Ambulatory Visit: Payer: Self-pay | Admitting: *Deleted

## 2020-09-23 ENCOUNTER — Encounter: Payer: Self-pay | Admitting: Oncology

## 2020-09-23 ENCOUNTER — Inpatient Hospital Stay: Payer: 59

## 2020-09-23 ENCOUNTER — Encounter: Payer: Self-pay | Admitting: *Deleted

## 2020-09-23 ENCOUNTER — Inpatient Hospital Stay: Payer: 59 | Admitting: Pharmacist

## 2020-09-23 ENCOUNTER — Inpatient Hospital Stay (HOSPITAL_BASED_OUTPATIENT_CLINIC_OR_DEPARTMENT_OTHER): Payer: 59 | Admitting: Oncology

## 2020-09-23 DIAGNOSIS — G893 Neoplasm related pain (acute) (chronic): Secondary | ICD-10-CM | POA: Diagnosis not present

## 2020-09-23 DIAGNOSIS — Z5181 Encounter for therapeutic drug level monitoring: Secondary | ICD-10-CM

## 2020-09-23 DIAGNOSIS — C50919 Malignant neoplasm of unspecified site of unspecified female breast: Secondary | ICD-10-CM

## 2020-09-23 DIAGNOSIS — C7951 Secondary malignant neoplasm of bone: Secondary | ICD-10-CM | POA: Diagnosis not present

## 2020-09-23 DIAGNOSIS — D702 Other drug-induced agranulocytosis: Secondary | ICD-10-CM | POA: Diagnosis not present

## 2020-09-23 DIAGNOSIS — Z170 Estrogen receptor positive status [ER+]: Secondary | ICD-10-CM | POA: Diagnosis not present

## 2020-09-23 DIAGNOSIS — Z7983 Long term (current) use of bisphosphonates: Secondary | ICD-10-CM

## 2020-09-23 DIAGNOSIS — Z79899 Other long term (current) drug therapy: Secondary | ICD-10-CM | POA: Diagnosis not present

## 2020-09-23 DIAGNOSIS — C50911 Malignant neoplasm of unspecified site of right female breast: Secondary | ICD-10-CM | POA: Diagnosis not present

## 2020-09-23 DIAGNOSIS — Z510 Encounter for antineoplastic radiation therapy: Secondary | ICD-10-CM | POA: Diagnosis not present

## 2020-09-23 LAB — CBC WITH DIFFERENTIAL/PLATELET
Abs Immature Granulocytes: 0.01 10*3/uL (ref 0.00–0.07)
Basophils Absolute: 0 10*3/uL (ref 0.0–0.1)
Basophils Relative: 1 %
Eosinophils Absolute: 0 10*3/uL (ref 0.0–0.5)
Eosinophils Relative: 2 %
HCT: 36.9 % (ref 36.0–46.0)
Hemoglobin: 11.9 g/dL — ABNORMAL LOW (ref 12.0–15.0)
Immature Granulocytes: 1 %
Lymphocytes Relative: 34 %
Lymphs Abs: 0.7 10*3/uL (ref 0.7–4.0)
MCH: 26.4 pg (ref 26.0–34.0)
MCHC: 32.2 g/dL (ref 30.0–36.0)
MCV: 82 fL (ref 80.0–100.0)
Monocytes Absolute: 0.3 10*3/uL (ref 0.1–1.0)
Monocytes Relative: 13 %
Neutro Abs: 1 10*3/uL — ABNORMAL LOW (ref 1.7–7.7)
Neutrophils Relative %: 49 %
Platelets: 148 10*3/uL — ABNORMAL LOW (ref 150–400)
RBC: 4.5 MIL/uL (ref 3.87–5.11)
RDW: 23.4 % — ABNORMAL HIGH (ref 11.5–15.5)
WBC: 2.1 10*3/uL — ABNORMAL LOW (ref 4.0–10.5)
nRBC: 0 % (ref 0.0–0.2)

## 2020-09-23 LAB — COMPREHENSIVE METABOLIC PANEL
ALT: 17 U/L (ref 0–44)
AST: 20 U/L (ref 15–41)
Albumin: 4.1 g/dL (ref 3.5–5.0)
Alkaline Phosphatase: 99 U/L (ref 38–126)
Anion gap: 12 (ref 5–15)
BUN: 18 mg/dL (ref 6–20)
CO2: 22 mmol/L (ref 22–32)
Calcium: 8.8 mg/dL — ABNORMAL LOW (ref 8.9–10.3)
Chloride: 106 mmol/L (ref 98–111)
Creatinine, Ser: 0.99 mg/dL (ref 0.44–1.00)
GFR, Estimated: >60 mL/min (ref >60)
Glucose, Bld: 144 mg/dL — ABNORMAL HIGH (ref 70–99)
Potassium: 3.7 mmol/L (ref 3.5–5.1)
Sodium: 140 mmol/L (ref 135–145)
Total Bilirubin: 0.6 mg/dL (ref 0.3–1.2)
Total Protein: 7.1 g/dL (ref 6.5–8.1)

## 2020-09-23 MED ORDER — SODIUM CHLORIDE 0.9 % IV SOLN
Freq: Once | INTRAVENOUS | Status: AC
  Filled 2020-09-23: qty 250

## 2020-09-23 MED ORDER — ZOLEDRONIC ACID 4 MG/100ML IV SOLN
4.0000 mg | INTRAVENOUS | Status: DC
  Administered 2020-09-23: 4 mg via INTRAVENOUS
  Filled 2020-09-23: qty 100

## 2020-09-23 NOTE — Progress Notes (Signed)
Pt's stamina is not the same, she gets fatigued more easily especially for her to work at [employer]. Pt thinks she eats good and drinks good, no Bowel issues. She is wondering if she can have a note for work to see if she can do triage.

## 2020-09-23 NOTE — Progress Notes (Signed)
[place]  [phone] [phone]  Patient Care Team: Derinda Late, MD as PCP - General (Family Medicine) Vickie Epley, MD as PCP - Electrophysiology (Cardiology)   Name of the patient: Jacqueline Deleon  [id number]  Mar 16, 1961   Date of visit: 09/23/20  HPI: Patient is a 60 y.o. female with a newly diagnosed ER/PR positive, HER2 negative breast cancer. Planned treatment with Ibrance (palbociclib) and letrozole. Ibrance started on 08/26/20.  Reason for Consult: Oral chemotherapy follow-up for palbociclib therapy.   PAST MEDICAL HISTORY: Past Medical History:  Diagnosis Date  . Allergic genetic state   . Arthritis   . BRCA negative 2004  . Breast cancer ([place])    2009 infiltrating ductal cancer of right breast  . Breast mass 08/2006, 03/2009   left breast biopsy fibroadenoma (2008) right breast biopsy benign (2010)  . 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 Central serous retinopathy   . 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 Chicken pox   . Depression   . Fatty liver   . Fatty liver   . Gastric polyps   . GERD (gastroesophageal reflux disease)   . Gestational diabetes    prediabetes out side of having baby  . Headache    migraines  . Heart murmur   . History of abnormal mammogram 08/2006, 01/2008, 03/2009  . Hypertension   . IBS (irritable bowel syndrome)   . Joint disorder    hx of left ankle tendonitis, bursitis, heel spur  . 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 Obesity 2018   BMI 45  . Pelvic pain    adhesions and adenomyosis  . Personal history of radiation therapy   . Rosacea     PAST SURGICAL HISTORY:  Past Surgical History:  Procedure Laterality Date  . ABDOMINAL HYSTERECTOMY    . BREAST BIOPSY Left 08/2006   benign fibroadenoma  . BREAST BIOPSY Right 08/11/2020   [place] bx of LN, hydromarker, Invasive  mammary carcinoma  . BREAST EXCISIONAL BIOPSY Right 02/26/2008   right breast invasive mam ca with rad partial mastectomy   . BREAST SURGERY Right    x2/ Dr Tamala Julian  . CARDIAC CATHETERIZATION  2006  . CESAREAN SECTION  04/02/1998  . CHOLECYSTECTOMY  04/2010   Dr. Smith/ laprascopic surgery  . COLONOSCOPY  04/04/2000  . COLONOSCOPY WITH PROPOFOL N/A 02/12/2019   Procedure: COLONOSCOPY WITH PROPOFOL;  Surgeon: Lollie Sails, MD;  Location: [facility] ENDOSCOPY;  Service: Endoscopy;  Laterality: N/A;  . ESOPHAGOGASTRODUODENOSCOPY (EGD) WITH PROPOFOL N/A 05/01/2015   Procedure: ESOPHAGOGASTRODUODENOSCOPY (EGD) WITH PROPOFOL;  Surgeon: Hulen Luster, MD;  Location: [facility] ENDOSCOPY;  Service: Gastroenterology;  Laterality: N/A;  . ESOPHAGOGASTRODUODENOSCOPY (EGD) WITH PROPOFOL N/A 02/12/2019   Procedure: ESOPHAGOGASTRODUODENOSCOPY (EGD) WITH PROPOFOL;  Surgeon: Lollie Sails, MD;  Location: [facility] ENDOSCOPY;  Service: Endoscopy;  Laterality: N/A;  . EYE SURGERY  08/2012   laser surgery on retina/ DR Appenzeller  . FINGER SURGERY     repair of tendon in right index, Dr. Francesco Sor in [place]  . FOOT SURGERY     Dr. Milinda Pointer  . IRRIGATION AND DEBRIDEMENT SEBACEOUS CYST     right upper back  . LAPAROSCOPIC SUPRACERVICAL HYSTERECTOMY  06/2007   Dr Kincius/ adhesions/CPP/adenomyosis  . MASTECTOMY PARTIAL / LUMPECTOMY Right 01/2008   with sentinal lymph node biopsy/ infiltrating ductal cancer  .  REPAIR PERONEAL TENDONS ANKLE  10/2019   with tarsal exostectomy and sural neuroma excision on right   . SKIN CANCER EXCISION     back and calves  . WISDOM TOOTH EXTRACTION  1991    HEMATOLOGY/ONCOLOGY HISTORY:  Oncology History  Metastatic breast cancer ([place])  08/19/2020 Cancer Staging   Staging form: Breast, AJCC 8th Edition - Clinical stage from 08/19/2020: Stage IV (rcTX, cNX, cM1, G3, ER+, PR+, HER2-) - Signed by Sindy Guadeloupe, MD on 08/23/2020 Stage prefix: Recurrence   08/23/2020 Initial Diagnosis    Metastatic breast cancer ([place])   08/23/2020 -  Chemotherapy    Patient is on Treatment Plan: BREAST PALBOCICLIB / LETROZOLE Q28D        ALLERGIES:  is allergic to kiwi extract, codeine, grapeseed extract [nutritional supplements], amoxicillin, erythromycin, sulfa antibiotics, and tape.  MEDICATIONS:  Current Outpatient Medications  Medication Sig Dispense Refill  . acetaminophen (TYLENOL) 325 MG tablet Take 650 mg by mouth every 6 (six) hours as needed for mild pain or headache.    Marland Kitchen buPROPion (WELLBUTRIN XL) 300 MG 24 hr tablet Take 300 mg by mouth at bedtime.    . citalopram (CELEXA) 40 MG tablet Take 40 mg by mouth at bedtime.    . clotrimazole (LOTRIMIN) 1 % cream Apply 1 application topically daily. groin    . diltiazem (CARDIZEM CD) 120 MG 24 hr capsule Take 1 capsule (120 mg total) by mouth daily. 30 capsule 11  . diphenhydrAMINE (BENADRYL) 25 MG tablet Take 25 mg by mouth at bedtime as needed for sleep.    Marland Kitchen ketotifen (ZADITOR) 0.025 % ophthalmic solution Place 1 drop into both eyes daily.    Marland Kitchen letrozole (FEMARA) 2.5 MG tablet Take 1 tablet (2.5 mg total) by mouth daily. 90 tablet 4  . lisinopril (PRINIVIL,ZESTRIL) 10 MG tablet Take 10 mg by mouth at bedtime.    . Meloxicam 7.5 MG TBDP Take 7.5 mg by mouth daily as needed. 30 tablet 0  . metFORMIN (GLUCOPHAGE-XR) 500 MG 24 hr tablet Take 500 mg by mouth in the morning and at bedtime.    . Multiple Vitamin (MULTIVITAMIN) tablet Take 1 tablet by mouth daily.    Marland Kitchen omeprazole (PRILOSEC) 20 MG capsule Take 20 mg by mouth daily.    Marland Kitchen oxyCODONE (OXY IR/ROXICODONE) 5 MG immediate release tablet Take 1 tablet (5 mg total) by mouth every 6 (six) hours as needed for severe pain. (Patient not taking: Reported on 09/23/2020) 60 tablet 0  . palbociclib (IBRANCE) 125 MG tablet Take 1 tablet (125 mg total) by mouth daily. Take for 21 days on, 7 days off, repeat every 28 days. 21 tablet 5   No current facility-administered medications for this  visit.   Facility-Administered Medications Ordered in Other Visits  Medication Dose Route Frequency Provider Last Rate Last Admin  . Zoledronic Acid (ZOMETA) IVPB 4 mg  4 mg Intravenous Q28 days Sindy Guadeloupe, MD 400 mL/hr at 09/23/20 1531 4 mg at 09/23/20 1531    VITAL SIGNS: There were no vitals taken for this visit. There were no vitals filed for this visit.  Estimated body mass index is 46.41 kg/m as calculated from the following:   Height as of 08/27/20: 5' 3" (1.6 m).   Weight as of 08/27/20: 118.8 kg (262 lb).  LABS: CBC:    Component Value Date/Time   WBC 2.1 (L) 09/23/2020 1345   HGB 11.9 (L) 09/23/2020 1345   HGB 11.9 03/06/2020 4098  HCT 36.9 09/23/2020 1345   HCT 38.6 03/06/2020 0811   PLT 148 (L) 09/23/2020 1345   PLT 253 03/06/2020 0811   MCV 82.0 09/23/2020 1345   MCV 80 03/06/2020 0811   NEUTROABS 1.0 (L) 09/23/2020 1345   NEUTROABS 4.7 03/06/2020 0811   LYMPHSABS 0.7 09/23/2020 1345   LYMPHSABS 1.6 03/06/2020 0811   MONOABS 0.3 09/23/2020 1345   EOSABS 0.0 09/23/2020 1345   EOSABS 0.1 03/06/2020 0811   BASOSABS 0.0 09/23/2020 1345   BASOSABS 0.0 03/06/2020 0811   Comprehensive Metabolic Panel:    Component Value Date/Time   NA 140 09/23/2020 1345   NA 141 03/06/2020 0811   K 3.7 09/23/2020 1345   CL 106 09/23/2020 1345   CO2 22 09/23/2020 1345   BUN 18 09/23/2020 1345   BUN 18 03/06/2020 0811   CREATININE 0.99 09/23/2020 1345   GLUCOSE 144 (H) 09/23/2020 1345   CALCIUM 8.8 (L) 09/23/2020 1345   AST 20 09/23/2020 1345   ALT 17 09/23/2020 1345   ALKPHOS 99 09/23/2020 1345   BILITOT 0.6 09/23/2020 1345   BILITOT <0.2 03/06/2020 0811   PROT 7.1 09/23/2020 1345   PROT 6.6 03/06/2020 0811   ALBUMIN 4.1 09/23/2020 1345   ALBUMIN 4.2 03/06/2020 0811    RADIOGRAPHIC STUDIES: MR Brain W Wo Contrast  Result Date: 09/02/2020 CLINICAL DATA:  Breast cancer, staging. EXAM: MRI HEAD WITHOUT AND WITH CONTRAST TECHNIQUE: Multiplanar, multiecho pulse  sequences of the brain and surrounding structures were obtained without and with intravenous contrast. CONTRAST:  47m GADAVIST GADOBUTROL 1 MMOL/ML IV SOLN COMPARISON:  None. FINDINGS: Brain: No acute infarction, hemorrhage, hydrocephalus, extra-axial collection or mass lesion. 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 Scattered foci of T2 hyperintensity are seen within the white matter of cerebral hemispheres, nonspecific. No focus of abnormal contrast enhancement. Vascular: Normal flow voids. Skull and upper cervical spine: Normal marrow signal. Sinuses/Orbits: Small mucous retention cyst in the left maxillary sinus. The orbits are maintained. Other: Small right mastoid effusion. IMPRESSION: 1. No evidence of intracranial metastatic disease. 2. 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 3. Scattered foci of T2 hyperintensity within the white matter of the cerebral hemispheres, nonspecific but may represent mild chronic microvascular ischemic changes. Electronically Signed   By: KPedro EarlsM.D.   On: 09/02/2020 12:19     Assessment and Plan-  CMP and CBC from today reviewed, ANC 1.0K/uL and AST/ALT within normal limits. Plan to continue palbociclib. 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  Diarrhea: no more than her usual, she had diarrhea prior to start this current treatment She had not noticed any hair loss or rash  Oral Chemotherapy Adherence: no missed doses reported  Medication Access Issues: no issues, patient fills her medication at [facility] She will [facility] until her appt on 10/21/20 incase she is in need of a dose reduction.   No patient barriers to medication adherence identified.   Patient expressed understanding and was  in agreement with this plan. She also understands that She can call clinic at any time with any questions, concerns, or complaints.   Thank you for allowing me to participate in the care of this very pleasant patient.   Time Total: 15 mins  Visit consisted of counseling and education on dealing with issues of symptom management in the setting of serious and potentially life-threatening illness.Greater than 50%  of this time was spent counseling and coordinating care related to the above assessment and plan.  Signed by: Darl Pikes, PharmD, BCPS, Salley Slaughter, CPP Hematology/Oncology Clinical Pharmacist Practitioner [facility] [phone]  09/23/2020 3:52 PM

## 2020-09-24 ENCOUNTER — Encounter: Payer: Self-pay | Admitting: Oncology

## 2020-09-24 ENCOUNTER — Telehealth: Payer: Self-pay | Admitting: *Deleted

## 2020-09-24 NOTE — Telephone Encounter (Signed)
Called pt and let her know it feels like flu like symptoms with zometa. Take tylenol and then take oxycodone . If she does not feel better after 24 hours to call us

## 2020-09-24 NOTE — Telephone Encounter (Signed)
I can do a virtual visit if she would like.  Faythe Casa, NP 09/24/2020 10:41 AM

## 2020-09-24 NOTE — Telephone Encounter (Signed)
Patient called reporting that she has had a terrible night and morning, she has body aches all over, fever of 100.9, and nausea. She had Zometa yesterday and she is asking what can be done. She also states that she has sent a My Chart message. Please advise

## 2020-09-25 NOTE — Progress Notes (Signed)
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 It was a 1.4 cm tumor with negative lymph nodes grade 1 and patient went on to get lumpectomy followed by adjuvant radiation therapy. Oncotype DX score was 6 and she did not require adjuvant chemotherapy. She was on tamoxifen for 5 years. She had BRCA testing done at that time which was negative.  Patient felt a lump in her right axilla in September 2021. She underwent ultrasound of bilateral breasts which did not pick up any axillary mass. A prior screening mammogram in September 2021 was unremarkable. She was then admitted for Covid pneumonia and underwent CT angio chest on 07/22/2020 which incidentally picked up an axillary mass measuring 2.5 cm. No obvious breast mass. Axillary mass was biopsied and was consistent with high-grade invasive mammary carcinoma. IHC was positive for GATA3 with diffuse strong nuclear staining. There was no lymph node architecture identified in the sample and it could not be determined if it is a primary tumor within the axillary breast tissue or metastases.  ER strongly positive greater than 90%, PR 1 to 10% positive and HER-2 negative  PET CT scan showed hypermetabolic poorly marginated solid right axillary mass 2.5 x 2 cm with an SUV of 5.6.  No enlarged hypermetabolic  mediastinal or hilar lymph nodes no enlarged left axillary lymph nodes.  Subcentimeter lung nodules below PET resolution.  Small hypermetabolism in the right liver with an SUV of 6 without CT correlate equivocal for liver metastases.  Multiple hypermetabolic faintly lytic osseous lesions throughout the lumbar spine and bilateral pelvic girdle with representative supra-acetabular right iliac bone 1.9 cm lesion, anterior superior left acetabular 1.7 cm lesion and L1 1.2 cm lesion.  NGS testing showed no actionable mutations.  PD-L1 less than 1%.  CHEK2 S422 FS, K3 CA and 1068 FS, PICC 3 CA and 345H, CCN E1 gain, ERB B2 1 P-CSF 1 hour fusion, FGF 19 gain, FGF 3 gain, FGF 4gain   Interval history-patient reports left hip pain is better after receiving radiation treatment.  She does report ongoing fatigue and is adjusting to her new routine.  She started cycle 2-day 1 of Ibrance yesterday.  She is also tolerating letrozole along with calcium and vitamin D well.  ECOG PS- 1 Pain scale- 0 Opioid associated constipation- no  Review of systems- Review of Systems  Constitutional: Positive for malaise/fatigue. Negative for chills, fever and weight loss.  HENT: Negative for congestion, ear discharge and nosebleeds.   Eyes: Negative for blurred vision.  Respiratory: Negative for cough, hemoptysis, sputum production, shortness of breath and wheezing.   Cardiovascular: Negative for chest pain, palpitations, orthopnea and claudication.  Gastrointestinal: Negative for abdominal pain, blood in stool, constipation, diarrhea, heartburn, melena, nausea and vomiting.  Genitourinary: Negative for dysuria, flank pain, frequency, hematuria and urgency.  Musculoskeletal: Negative for back pain, joint pain and myalgias.  Skin: Negative for rash.  Neurological: Negative for dizziness, tingling, focal weakness, seizures, weakness and headaches.  Endo/Heme/Allergies: Does not bruise/bleed easily.  Psychiatric/Behavioral:  Negative for depression and suicidal ideas. The patient does not have insomnia.        Allergies  Allergen Reactions  . Kiwi Extract Itching and Swelling    The real kiwi fruit  . Codeine Other (See Comments)    Felt funny.  Lorenza Chick Extract [Nutritional Supplements] Itching and Swelling    Raw Grapes only if cooked it is ok  . Amoxicillin Rash  . Erythromycin Rash  . Sulfa Antibiotics Rash  . Tape Rash    Adhesives  Pt can have paper tape     Past Medical History:  Diagnosis Date  . Allergic genetic state   . Arthritis   . BRCA negative 2004  . Breast cancer ([place])    2009 infiltrating ductal cancer of right breast  . Breast mass 08/2006, 03/2009   left breast biopsy fibroadenoma (2008) right breast biopsy benign (2010)  . 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 Central serous retinopathy   . 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 Chicken pox   . Depression   . Fatty liver   . Fatty liver   . Gastric polyps   . GERD (gastroesophageal reflux disease)   . Gestational diabetes    prediabetes out side of having baby  . Headache    migraines  . Heart murmur   . History of abnormal mammogram 08/2006, 01/2008, 03/2009  . Hypertension   . IBS (irritable bowel syndrome)   . Joint disorder    hx of left ankle tendonitis, bursitis, heel spur  . 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 Obesity 2018   BMI 45  . Pelvic pain    adhesions and adenomyosis  . Personal history of radiation therapy   . Rosacea      Past Surgical History:  Procedure Laterality Date  . ABDOMINAL HYSTERECTOMY    . BREAST BIOPSY Left 08/2006   benign fibroadenoma  . BREAST BIOPSY Right 08/11/2020   [place] bx of LN, hydromarker, Invasive mammary carcinoma  . BREAST EXCISIONAL BIOPSY Right 02/26/2008   right breast invasive mam ca with rad partial mastectomy   . BREAST SURGERY Right    x2/ Dr Tamala Julian  . CARDIAC  CATHETERIZATION  2006  . CESAREAN SECTION  04/02/1998  . CHOLECYSTECTOMY  04/2010   Dr. Smith/ laprascopic surgery  . COLONOSCOPY  04/04/2000  . COLONOSCOPY WITH PROPOFOL N/A 02/12/2019   Procedure: COLONOSCOPY WITH PROPOFOL;  Surgeon: Lollie Sails, MD;  Location: [facility] ENDOSCOPY;  Service: Endoscopy;  Laterality: N/A;  . ESOPHAGOGASTRODUODENOSCOPY (EGD) WITH PROPOFOL N/A 05/01/2015   Procedure: ESOPHAGOGASTRODUODENOSCOPY (EGD) WITH PROPOFOL;  Surgeon: Hulen Luster, MD;  Location: [facility] ENDOSCOPY;  Service: Gastroenterology;  Laterality: N/A;  . ESOPHAGOGASTRODUODENOSCOPY (EGD) WITH PROPOFOL N/A 02/12/2019   Procedure: ESOPHAGOGASTRODUODENOSCOPY (EGD) WITH PROPOFOL;  Surgeon: Lollie Sails, MD;  Location:  [facility] ENDOSCOPY;  Service: Endoscopy;  Laterality: N/A;  . EYE SURGERY  08/2012   laser surgery on retina/ DR Appenzeller  . FINGER SURGERY     repair of tendon in right index, Dr. Francesco Sor in [place]  . FOOT SURGERY     Dr. Milinda Pointer  . IRRIGATION AND DEBRIDEMENT SEBACEOUS CYST     right upper back  . LAPAROSCOPIC SUPRACERVICAL HYSTERECTOMY  06/2007   Dr Kincius/ adhesions/CPP/adenomyosis  .  MASTECTOMY PARTIAL / LUMPECTOMY Right 01/2008   with sentinal lymph node biopsy/ infiltrating ductal cancer  . REPAIR PERONEAL TENDONS ANKLE  10/2019   with tarsal exostectomy and sural neuroma excision on right   . SKIN CANCER EXCISION     back and calves  . WISDOM TOOTH EXTRACTION  1991    Social History   Socioeconomic History  . Marital status: Married    Spouse name: Richardson Landry  . Number of children: 1  . Years of education: 37  . Highest education level: Not on file  Occupational History  . Occupation: CMA  Tobacco Use  . Smoking status: Never Smoker  . Smokeless tobacco: Never Used  Vaping Use  . Vaping Use: Never used  Substance and Sexual Activity  . Alcohol use: Yes    Alcohol/week: 0.0 standard drinks    Comment: rare, 1-2 drinks per year  . Drug use: No  . Sexual  activity: Yes    Partners: Male    Birth control/protection: Surgical    Comment: Hysterectomy   Other Topics Concern  . Not on file  Social History Narrative   Lives in [place] with husband, no pets.  Works at [employer].      Diet - regular   Exercise - occasional   Social Determinants of Health   Financial Resource Strain: Not on file  Food Insecurity: Not on file  Transportation Needs: Not on file  Physical Activity: Not on file  Stress: Not on file  Social Connections: Not on file  Intimate Partner Violence: Not on file    Family History  Problem Relation Age of Onset  . Hypertension Mother   . Thyroid disease Mother   . Breast cancer Mother 63  . Colon cancer Mother 10       again at 33  . Atrial fibrillation Mother   . Hip fracture Mother   . Skin cancer Mother   . Stroke Father   . Hypertension Father   . Cancer Father 68       prostate  . Parkinson's disease Father   . Skin cancer Father   . Hypertension Sister   . Thyroid disease Sister   . Cancer Sister        skin/ squamous cell  . Lung cancer Paternal Aunt 80       smoker  . Diabetes Maternal Grandmother   . Stroke Maternal Grandfather   . Heart disease Paternal Grandfather   . Diabetes Maternal Aunt   . Lymphoma Maternal Uncle   . Thyroid cancer Cousin        maternal  . Heart disease Maternal Uncle   . Cancer Maternal Aunt   . Breast cancer Maternal Aunt 75  . Breast cancer Cousin 60       maternal  . Breast cancer Cousin 40  . Colon cancer Cousin      Current Outpatient Medications:  .  acetaminophen (TYLENOL) 325 MG tablet, Take 650 mg by mouth every 6 (six) hours as needed for mild pain or headache., Disp: , Rfl:  .  buPROPion (WELLBUTRIN XL) 300 MG 24 hr tablet, Take 300 mg by mouth at bedtime., Disp: , Rfl:  .  citalopram (CELEXA) 40 MG tablet, Take 40 mg by mouth at bedtime., Disp: , Rfl:  .  clotrimazole (LOTRIMIN) 1 % cream, Apply 1 application topically daily. groin, Disp: ,  Rfl:  .  diltiazem (CARDIZEM CD) 120 MG 24 hr capsule, Take 1 capsule (120 mg  total) by mouth daily., Disp: 30 capsule, Rfl: 11 .  diphenhydrAMINE (BENADRYL) 25 MG tablet, Take 25 mg by mouth at bedtime as needed for sleep., Disp: , Rfl:  .  ketotifen (ZADITOR) 0.025 % ophthalmic solution, Place 1 drop into both eyes daily., Disp: , Rfl:  .  letrozole (FEMARA) 2.5 MG tablet, Take 1 tablet (2.5 mg total) by mouth daily., Disp: 90 tablet, Rfl: 4 .  lisinopril (PRINIVIL,ZESTRIL) 10 MG tablet, Take 10 mg by mouth at bedtime., Disp: , Rfl:  .  Meloxicam 7.5 MG TBDP, Take 7.5 mg by mouth daily as needed., Disp: 30 tablet, Rfl: 0 .  metFORMIN (GLUCOPHAGE-XR) 500 MG 24 hr tablet, Take 500 mg by mouth in the morning and at bedtime., Disp: , Rfl:  .  Multiple Vitamin (MULTIVITAMIN) tablet, Take 1 tablet by mouth daily., Disp: , Rfl:  .  omeprazole (PRILOSEC) 20 MG capsule, Take 20 mg by mouth daily., Disp: , Rfl:  .  palbociclib (IBRANCE) 125 MG tablet, Take 1 tablet (125 mg total) by mouth daily. Take for 21 days on, 7 days off, repeat every 28 days., Disp: 21 tablet, Rfl: 5 .  oxyCODONE (OXY IR/ROXICODONE) 5 MG immediate release tablet, Take 1 tablet (5 mg total) by mouth every 6 (six) hours as needed for severe pain. (Patient not taking: Reported on 09/23/2020), Disp: 60 tablet, Rfl: 0  Physical exam:  Physical Exam Constitutional:      General: She is not in acute distress. Cardiovascular:     Rate and Rhythm: Normal rate and regular rhythm.     Heart sounds: Normal heart sounds.  Pulmonary:     Effort: Pulmonary effort is normal.     Breath sounds: Normal breath sounds.  Skin:    General: Skin is warm and dry.  Neurological:     Mental Status: She is alert and oriented to person, place, and time.      CMP Latest Ref Rng & Units 09/23/2020  Glucose 70 - 99 mg/dL 144(H)  BUN 6 - 20 mg/dL 18  Creatinine 0.44 - 1.00 mg/dL 0.99  Sodium 135 - 145 mmol/L 140  Potassium 3.5 - 5.1 mmol/L 3.7   Chloride 98 - 111 mmol/L 106  CO2 22 - 32 mmol/L 22  Calcium 8.9 - 10.3 mg/dL 8.8(L)  Total Protein 6.5 - 8.1 g/dL 7.1  Total Bilirubin 0.3 - 1.2 mg/dL 0.6  Alkaline Phos 38 - 126 U/L 99  AST 15 - 41 U/L 20  ALT 0 - 44 U/L 17   CBC Latest Ref Rng & Units 09/23/2020  WBC 4.0 - 10.5 K/uL 2.1(L)  Hemoglobin 12.0 - 15.0 g/dL 11.9(L)  Hematocrit 36.0 - 46.0 % 36.9  Platelets 150 - 400 K/uL 148(L)    No images are attached to the encounter.  MR Brain W Wo Contrast  Result Date: 09/02/2020 CLINICAL DATA:  Breast cancer, staging. EXAM: MRI HEAD WITHOUT AND WITH CONTRAST TECHNIQUE: Multiplanar, multiecho pulse sequences of the brain and surrounding structures were obtained without and with intravenous contrast. CONTRAST:  74mL GADAVIST GADOBUTROL 1 MMOL/ML IV SOLN COMPARISON:  None. FINDINGS: Brain: No acute infarction, hemorrhage, hydrocephalus, extra-axial collection or mass lesion. 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 Scattered foci of T2 hyperintensity are seen within the white matter of cerebral hemispheres, nonspecific. No focus of abnormal contrast enhancement. Vascular: Normal flow voids. Skull and upper cervical spine: Normal marrow signal. Sinuses/Orbits: Small mucous retention cyst in the left maxillary sinus. The orbits are maintained.  Other: Small right mastoid effusion. IMPRESSION: 1. No evidence of intracranial metastatic disease. 2. 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 3. Scattered foci of T2 hyperintensity within the white matter of the cerebral hemispheres, nonspecific but may represent mild chronic microvascular ischemic changes. Electronically Signed   By: Pedro Earls M.D.   On: 09/02/2020 12:19     Assessment and plan- Patient is a 60 y.o. female with metastatic ER positive breast cancer on letrozole and Ibrance here for routine follow-up  Patient had gone to [facility] for second opinion for her breast cancer and they  concurred with proceeding with Ibrance and letrozole at this time.  After starting Ibrance patient has developed neutropenia.  Her white count is 2.1 with an [place] of 1.  Platelets mildly low at 148.  She will continue Ibrance at 125 mg daily 3 weeks on 1 week off and I will plan to repeat her CBC again in 2 weeks.  If her [place] is less than 1 at that time she will hold Ibrance for the rest of the cycle and I will restart at a lower dose at 100 mg.  Hopefully that would help her fatigue as well.  We will also obtain dental clearance to get Zometa today and she will proceed with dose 1 of Zometa today.  Discussed risks and benefits of Zometa including all but not limited to fatigue, hypocalcemia and possible osteonecrosis of jaw.  Patient understands and agrees to proceed as planned  Discussed the results of NGS testing which did show PIK 3 CA mutation but not on the common exon 7 9 and 20 against which Pi[place]ray is actionable.  But since she has a pathogenic PIK 3 CA mutation this can be still considered down the line.  She also has a pathogenic CHEK2 both germline and somatic for which part inhibitors could be considered down the line.  For now would like to see how she does on Ibrance and letrozole.  Baseline tumor markers were normal  Neoplasm related pain: Continue as needed oxycodone  Patient and her husband had multiple insightful questions and they were all answered to her satisfaction     Visit Diagnosis 1. Metastatic breast cancer ([place])   2. High risk medication use   3. Encounter for monitoring zoledronic acid therapy   4. Neoplasm related pain   5. Drug-induced neutropenia ([place])      Dr. Randa Evens, MD, MPH [facility] at [facility] [phone] 09/25/2020 5:18 PM

## 2020-09-26 ENCOUNTER — Encounter: Payer: Self-pay | Admitting: Pharmacist

## 2020-09-26 ENCOUNTER — Telehealth: Payer: Self-pay | Admitting: *Deleted

## 2020-09-26 NOTE — Telephone Encounter (Signed)
Patient called asking if she is going to have side effects from her Zometa every time. I explained to her that everyone reacts differently and there is no way to predict if she will experience side effects again until she has another infusion. She asked about having her infusions of Fridays so that she has tome to re cooperate over the weekend. 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

## 2020-09-28 ENCOUNTER — Encounter: Payer: Self-pay | Admitting: *Deleted

## 2020-10-01 ENCOUNTER — Encounter: Payer: Self-pay | Admitting: Oncology

## 2020-10-01 ENCOUNTER — Encounter: Payer: Self-pay | Admitting: *Deleted

## 2020-10-01 ENCOUNTER — Telehealth: Payer: Self-pay | Admitting: *Deleted

## 2020-10-01 NOTE — Telephone Encounter (Signed)
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 If so, she states she will need a revised note sent stating this is ok fax to [phone]

## 2020-10-01 NOTE — Telephone Encounter (Signed)
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 [place] can you please send a revised letter?

## 2020-10-03 MED FILL — DILTIAZEM HCL ER COATED BEA: 120 | 30 days supply | Qty: 30 | Fill #1

## 2020-10-06 ENCOUNTER — Encounter: Payer: Self-pay | Admitting: Obstetrics and Gynecology

## 2020-10-06 ENCOUNTER — Other Ambulatory Visit: Payer: Self-pay | Admitting: Obstetrics and Gynecology

## 2020-10-06 ENCOUNTER — Ambulatory Visit (INDEPENDENT_AMBULATORY_CARE_PROVIDER_SITE_OTHER): Payer: 59 | Admitting: Obstetrics and Gynecology

## 2020-10-06 ENCOUNTER — Other Ambulatory Visit: Payer: Self-pay

## 2020-10-06 VITALS — BP 128/80 | Ht 63.0 in | Wt 257.0 lb

## 2020-10-06 DIAGNOSIS — R21 Rash and other nonspecific skin eruption: Secondary | ICD-10-CM | POA: Diagnosis not present

## 2020-10-06 DIAGNOSIS — B354 Tinea corporis: Secondary | ICD-10-CM

## 2020-10-06 MED ORDER — FLUCONAZOLE 150 MG PO TABS: 150.0000 mg | ORAL_TABLET | Freq: Once | ORAL | 0 refills | Status: DC

## 2020-10-06 MED ORDER — CLOTRIMAZOLE-BETAMETHASONE 1-0.05 % EX CREA: TOPICAL_CREAM | CUTANEOUS | 0 refills | Status: DC

## 2020-10-06 NOTE — Progress Notes (Unsigned)
Jacqueline Late, Jacqueline Deleon   Chief Complaint  Patient presents with  . Rash    HPI:      Jacqueline Deleon is a 60 y.o. G1P0101 whose LMP was No LMP recorded. Patient has had a hysterectomy., presents today for rash in LT inguinal area for a month. Area feels raw and irritated. Had radiation for bone mets in similar area so not sure if related to radiation. Has treated with silvadene crm, benadryl, clotrimazole (once daily only for a couple days), and aloe vera. Sx slightly improved but not resolved and pt concerned since lasting so long.  Has also had an itchy rash on waistline (where pants rub). Uses scented detergent and dryer sheets.    Past Medical History:  Diagnosis Date  . Allergic genetic state   . Arthritis   . BRCA negative 2004  . 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 Breast mass 08/2006, 03/2009   left breast biopsy fibroadenoma (2008) right breast biopsy benign (2010)  . 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 Central serous retinopathy   . 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 Chicken pox   . Depression   . Fatty liver   . Fatty liver   . Gastric polyps   . GERD (gastroesophageal reflux disease)   . Gestational diabetes    prediabetes out side of having baby  . Headache    migraines  . Heart murmur   . History of abnormal mammogram 08/2006, 01/2008, 03/2009  . Hypertension   . IBS (irritable bowel syndrome)   . Joint disorder    hx of left ankle tendonitis, bursitis, heel spur  . 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 Obesity 2018   BMI 45  . Pelvic pain    adhesions and adenomyosis  . Personal history of radiation therapy   . Rosacea     Past Surgical History:  Procedure Laterality Date  . ABDOMINAL HYSTERECTOMY    . BREAST BIOPSY Left 08/2006   benign fibroadenoma  . BREAST BIOPSY Right 08/11/2020   [place] bx of LN, hydromarker,  Invasive mammary carcinoma  . BREAST EXCISIONAL BIOPSY Right 02/26/2008   right breast invasive mam ca with rad partial mastectomy   . BREAST SURGERY Right    x2/ Dr Tamala Julian  . CARDIAC CATHETERIZATION  2006  . CESAREAN SECTION  04/02/1998  . CHOLECYSTECTOMY  04/2010   Dr. Smith/ laprascopic surgery  . COLONOSCOPY  04/04/2000  . COLONOSCOPY WITH PROPOFOL N/A 02/12/2019   Procedure: COLONOSCOPY WITH PROPOFOL;  Surgeon: Lollie Sails, Jacqueline Deleon;  Location: [facility] ENDOSCOPY;  Service: Endoscopy;  Laterality: N/A;  . ESOPHAGOGASTRODUODENOSCOPY (EGD) WITH PROPOFOL N/A 05/01/2015   Procedure: ESOPHAGOGASTRODUODENOSCOPY (EGD) WITH PROPOFOL;  Surgeon: Hulen Luster, Jacqueline Deleon;  Location: [facility] ENDOSCOPY;  Service: Gastroenterology;  Laterality: N/A;  . ESOPHAGOGASTRODUODENOSCOPY (EGD) WITH PROPOFOL N/A 02/12/2019   Procedure: ESOPHAGOGASTRODUODENOSCOPY (EGD) WITH PROPOFOL;  Surgeon: Lollie Sails, Jacqueline Deleon;  Location: [facility] END[facility]COPY;  Service: Endoscopy;  Laterality: N/A;  . EYE SURGERY  08/2012   laser surgery on retina/ DR Appenzeller  . FINGER SURGERY     repair of tendon in right index, Dr. Francesco Sor in [place]  . FOOT SURGERY     Dr. Milinda Pointer  . IRRIGATION AND DEBRIDEMENT SEBACEOUS CYST     right upper back  . LAPAROSCOPIC SUPRACERVICAL HYSTERECTOMY  06/2007   Dr Kincius/ adhesions/CPP/adenomyosis  . MASTECTOMY PARTIAL / LUMPECTOMY Right 01/2008   with sentinal lymph node biopsy/ infiltrating ductal cancer  . REPAIR PERONEAL TENDONS ANKLE  10/2019   with tarsal exostectomy and sural neuroma excision on right   . SKIN CANCER EXCISION     back and calves  . WISDOM TOOTH EXTRACTION  1991    Family History  Problem Relation Age of Onset  . Hypertension Mother   . Thyroid disease Mother   . Breast cancer Mother 47  . Colon cancer Mother 7       again at 40  . Atrial fibrillation Mother   . Hip fracture Mother   . Skin cancer Mother   . Stroke Father   . Hypertension Father   . Cancer Father 49        prostate  . Parkinson's disease Father   . Skin cancer Father   . Hypertension Sister   . Thyroid disease Sister   . Cancer Sister        skin/ squamous cell  . Lung cancer Paternal Aunt 80       smoker  . Diabetes Maternal Grandmother   . Stroke Maternal Grandfather   . Heart disease Paternal Grandfather   . Diabetes Maternal Aunt   . Lymphoma Maternal Uncle   . Thyroid cancer Cousin        maternal  . Heart disease Maternal Uncle   . Cancer Maternal Aunt   . Breast cancer Maternal Aunt 75  . Breast cancer Cousin 60       maternal  . Breast cancer Cousin 72  . Colon cancer Cousin     Social History   Socioeconomic History  . Marital status: Married    Spouse name: Richardson Landry  . Number of children: 1  . Years of education: 86  . Highest education level: Not on file  Occupational History  . Occupation: CMA  Tobacco Use  . Smoking status: Never Smoker  . Smokeless tobacco: Never Used  Vaping Use  . Vaping Use: Never used  Substance and Sexual Activity  . Alcohol use: Yes    Alcohol/week: 0.0 standard drinks    Comment: rare, 1-2 drinks per year  . Drug use: No  . Sexual activity: Yes    Partners: Male    Birth control/protection: Surgical    Comment: Hysterectomy   Other Topics Concern  . Not on file  Social History Narrative   Lives in [place] with husband, no pets.  Works at [employer].      Diet - regular   Exercise - occasional   Social Determinants of Health   Financial Resource Strain: Not on file  Food Insecurity: Not on file  Transportation Needs: Not on file  Physical Activity: Not on file  Stress: Not on file  Social Connections: Not on file  Intimate Partner Violence: Not on file    Outpatient Medications Prior to Visit  Medication Sig Dispense Refill  . acetaminophen (TYLENOL) 325 MG tablet Take 650 mg by mouth every 6 (six) hours as needed for mild pain or headache.    [place] Kitchen buPROPion (WELLBUTRIN XL) 300 MG 24 hr tablet Take 300 mg by mouth at  bedtime.    . citalopram (CELEXA) 40 MG tablet Take 40 mg by mouth at bedtime.    . clotrimazole (LOTRIMIN) 1 % cream Apply 1 application topically daily. groin    . diltiazem (CARDIZEM CD) 120 MG 24 hr capsule  Take 1 capsule (120 mg total) by mouth daily. 30 capsule 11  . diphenhydrAMINE (BENADRYL) 25 MG tablet Take 25 mg by mouth at bedtime as needed for sleep.    Mar[place] Kitchen ketotifen (ZADITOR) 0.025 % ophthalmic solution Place 1 drop into both eyes daily.    Mar[place] Kitchen letrozole (FEMARA) 2.5 MG tablet Take 1 tablet (2.5 mg total) by mouth daily. 90 tablet 4  . lisinopril (PRINIVIL,ZESTRIL) 10 MG tablet Take 10 mg by mouth at bedtime.    . Meloxicam 7.5 MG TBDP Take 7.5 mg by mouth daily as needed. 30 tablet 0  . metFORMIN (GLUCOPHAGE-XR) 500 MG 24 hr tablet Take 500 mg by mouth in the morning and at bedtime.    . Multiple Vitamin (MULTIVITAMIN) tablet Take 1 tablet by mouth daily.    [place] Kitchen omeprazole (PRILOSEC) 20 MG capsule Take 20 mg by mouth daily.    Mar[place] Kitchen oxyCODONE (OXY IR/ROXICODONE) 5 MG immediate release tablet Take 1 tablet (5 mg total) by mouth every 6 (six) hours as needed for severe pain. 60 tablet 0  . palbociclib (IBRANCE) 125 MG tablet Take 1 tablet (125 mg total) by mouth daily. Take for 21 days on, 7 days off, repeat every 28 days. 21 tablet 5   No facility-administered medications prior to visit.      ROS:  Review of Systems  Constitutional: Negative for fever.  Gastrointestinal: Negative for blood in stool, constipation, diarrhea, nausea and vomiting.  Genitourinary: Negative for dyspareunia, dysuria, flank pain, frequency, hematuria, urgency, vaginal bleeding, vaginal discharge and vaginal pain.  Musculoskeletal: Negative for back pain.  Skin: Positive for rash.    OBJECTIVE:   Vitals:  BP 128/80   Ht _0  (1.6 m)   Wt 257 lb (116.6 kg)   BMI 45.53 kg/m   Physical Exam Vitals reviewed.  Constitutional:      Appearance: She is well-developed.  Pulmonary:     Effort: Pulmonary  effort is normal.  Musculoskeletal:        General: Normal range of motion.     Cervical back: Normal range of motion.  Skin:    General: Skin is warm and dry.       Neurological:     General: No focal deficit present.     Mental Status: She is alert and oriented to person, place, and time.     Cranial Nerves: No cranial nerve deficit.  Psychiatric:        Mood and Affect: Mood normal.        Behavior: Behavior normal.        Thought Content: Thought content normal.        Judgment: Judgment normal.     Assessment/Plan: Tinea corporis - Plan: clotrimazole-betamethasone (LOTRISONE) cream, fluconazole (DIFLUCAN) 150 MG tablet; LT inguinal area. Rx diflucan and lotrisone crm BID for 2 wks (didn't use clotrimazole long enough or BID dosing), keep area dry . F/u prn.   Rash, skin--waistline. Question chem derm vs fungal. Rx diflucan anyway; unscented detergent, no dryer sheets. F/u prn.    Meds ordered this encounter  Medications  . clotrimazole-betamethasone (LOTRISONE) cream    Sig: Apply externally BID for 2 wks    Dispense:  45 g    Refill:  0    Order Specific Question:   Supervising Provider    Answer:   Gae Dry [id number]  . fluconazole (DIFLUCAN) 150 MG tablet    Sig: Take 1 tablet (150 mg total) by mouth once for 1 dose.  May repeat in 3 days if still having symptoms    Dispense:  2 tablet    Refill:  0    Order Specific Question:   Supervising Provider    Answer:   Gae Dry [[id number]]      Return if symptoms worsen or fail to improve.  Alicia B. Copland, PA-C 10/07/2020 10:25 AM

## 2020-10-07 ENCOUNTER — Other Ambulatory Visit: Payer: 59

## 2020-10-07 ENCOUNTER — Encounter: Payer: Self-pay | Admitting: Obstetrics and Gynecology

## 2020-10-07 ENCOUNTER — Other Ambulatory Visit (HOSPITAL_COMMUNITY): Payer: Self-pay

## 2020-10-07 ENCOUNTER — Inpatient Hospital Stay: Payer: 59

## 2020-10-07 DIAGNOSIS — Z170 Estrogen receptor positive status [ER+]: Secondary | ICD-10-CM | POA: Diagnosis not present

## 2020-10-07 DIAGNOSIS — C50911 Malignant neoplasm of unspecified site of right female breast: Secondary | ICD-10-CM | POA: Diagnosis not present

## 2020-10-07 DIAGNOSIS — Z510 Encounter for antineoplastic radiation therapy: Secondary | ICD-10-CM | POA: Diagnosis not present

## 2020-10-07 DIAGNOSIS — C7951 Secondary malignant neoplasm of bone: Secondary | ICD-10-CM | POA: Diagnosis not present

## 2020-10-07 DIAGNOSIS — C50919 Malignant neoplasm of unspecified site of unspecified female breast: Secondary | ICD-10-CM

## 2020-10-07 LAB — CBC WITH DIFFERENTIAL/PLATELET
Abs Immature Granulocytes: 0.01 10*3/uL (ref 0.00–0.07)
Basophils Absolute: 0 10*3/uL (ref 0.0–0.1)
Basophils Relative: 2 %
Eosinophils Absolute: 0 10*3/uL (ref 0.0–0.5)
Eosinophils Relative: 2 %
HCT: 40.7 % (ref 36.0–46.0)
Hemoglobin: 13.1 g/dL (ref 12.0–15.0)
Immature Granulocytes: 0 %
Lymphocytes Relative: 30 %
Lymphs Abs: 0.8 10*3/uL (ref 0.7–4.0)
MCH: 26.8 pg (ref 26.0–34.0)
MCHC: 32.2 g/dL (ref 30.0–36.0)
MCV: 83.2 fL (ref 80.0–100.0)
Monocytes Absolute: 0.2 10*3/uL (ref 0.1–1.0)
Monocytes Relative: 7 %
Neutro Abs: 1.6 10*3/uL — ABNORMAL LOW (ref 1.7–7.7)
Neutrophils Relative %: 59 %
Platelets: 282 10*3/uL (ref 150–400)
RBC: 4.89 MIL/uL (ref 3.87–5.11)
RDW: 25.2 % — ABNORMAL HIGH (ref 11.5–15.5)
Smear Review: NORMAL
WBC: 2.6 10*3/uL — ABNORMAL LOW (ref 4.0–10.5)
nRBC: 0 % (ref 0.0–0.2)

## 2020-10-07 LAB — COMPREHENSIVE METABOLIC PANEL
ALT: 19 U/L (ref 0–44)
AST: 18 U/L (ref 15–41)
Albumin: 4.3 g/dL (ref 3.5–5.0)
Alkaline Phosphatase: 94 U/L (ref 38–126)
Anion gap: 11 (ref 5–15)
BUN: 21 mg/dL — ABNORMAL HIGH (ref 6–20)
CO2: 23 mmol/L (ref 22–32)
Calcium: 9 mg/dL (ref 8.9–10.3)
Chloride: 104 mmol/L (ref 98–111)
Creatinine, Ser: 1.1 mg/dL — ABNORMAL HIGH (ref 0.44–1.00)
GFR, Estimated: 59 mL/min — ABNORMAL LOW (ref >60)
Glucose, Bld: 115 mg/dL — ABNORMAL HIGH (ref 70–99)
Potassium: 4.3 mmol/L (ref 3.5–5.1)
Sodium: 138 mmol/L (ref 135–145)
Total Bilirubin: 0.5 mg/dL (ref 0.3–1.2)
Total Protein: 7.5 g/dL (ref 6.5–8.1)

## 2020-10-07 NOTE — Patient Instructions (Signed)
I value your feedback and you entrusting us with your care. If you get a Jacqueline Deleon patient survey, I would appreciate you taking the time to let us know about your experience today. Thank you! ? ? ?

## 2020-10-08 MED FILL — OMEPRAZOLE 20 MG CAP: 20 | 90 days supply | Qty: 180 | Fill #2

## 2020-10-10 ENCOUNTER — Other Ambulatory Visit: Payer: 59

## 2020-10-13 ENCOUNTER — Other Ambulatory Visit (HOSPITAL_COMMUNITY): Payer: Self-pay

## 2020-10-13 ENCOUNTER — Ambulatory Visit: Payer: 59 | Admitting: Radiation Oncology

## 2020-10-13 MED FILL — Bupropion HCl Tab ER 24HR 300 MG: ORAL | 90 days supply | Qty: 90 | Fill #0 | Status: AC

## 2020-10-14 ENCOUNTER — Other Ambulatory Visit: Payer: Self-pay

## 2020-10-14 ENCOUNTER — Telehealth: Payer: Self-pay | Admitting: *Deleted

## 2020-10-14 MED ORDER — GABAPENTIN 100 MG PO CAPS
100.0000 mg | ORAL_CAPSULE | Freq: Three times a day (TID) | ORAL | 1 refills | Status: DC
  Filled 2020-10-14: qty 270, 90d supply, fill #0

## 2020-10-14 MED ORDER — ALBUTEROL SULFATE HFA 108 (90 BASE) MCG/ACT IN AERS
INHALATION_SPRAY | RESPIRATORY_TRACT | 5 refills | Status: DC
  Filled 2020-10-14: qty 18, 16d supply, fill #0
  Filled 2020-10-23: qty 18, 16d supply, fill #1

## 2020-10-14 NOTE — Telephone Encounter (Signed)
Call returned to patient and advised of doctor response by leaving message on confidential voice mail as requested by patient on initial call

## 2020-10-14 NOTE — Telephone Encounter (Signed)
Both tylenol and pseudafed is fine. If she is not better by Friday she will need to call us back

## 2020-10-14 NOTE — Telephone Encounter (Signed)
Patient called reporting that Dr Janese Banks wanted to know if she felt she was sick; she reports that she has sinus infection vs allergies. Her nose is stuffy yet running, facial pain, headache, Aching across her lower shoulder blades, occasional sneezing. She denies Fever nor cough. She is asking if she can take Pseudofed or Tylenol or what do you suggest. Please advise

## 2020-10-15 ENCOUNTER — Encounter: Payer: Self-pay | Admitting: Pharmacist

## 2020-10-15 ENCOUNTER — Other Ambulatory Visit: Payer: Self-pay

## 2020-10-16 ENCOUNTER — Other Ambulatory Visit: Payer: Self-pay

## 2020-10-16 NOTE — Progress Notes (Unsigned)
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 She would go down to 100mg  if [name] was fatigued or having trouble keeping up at work on the 125mg  dose.   I reach out to [name] to see how she was feeling. She is tired now and thinks she might have a bit of a sinus infection. She called the office to report this on 10/14/20., she was given the okay to use tylenol and sudafed. She has also started taking Claritin. She is feeling better today but not completely better. She would like to wait until next week to decide about staying on the 125mg  dose. I will call and check on her again on Tuesday 10/21/20.   Darl Pikes, PharmD, BCPS, BCOP, CPP Hematology/Oncology Clinical Pharmacist [facility]/AP [facility] [phone]  10/16/2020 3:17 PM

## 2020-10-17 ENCOUNTER — Other Ambulatory Visit: Payer: Self-pay | Admitting: *Deleted

## 2020-10-17 ENCOUNTER — Other Ambulatory Visit (HOSPITAL_COMMUNITY): Payer: Self-pay

## 2020-10-17 ENCOUNTER — Telehealth: Payer: Self-pay | Admitting: *Deleted

## 2020-10-17 DIAGNOSIS — C50911 Malignant neoplasm of unspecified site of right female breast: Secondary | ICD-10-CM

## 2020-10-17 MED ORDER — PALBOCICLIB 100 MG PO CAPS
100.0000 mg | ORAL_CAPSULE | Freq: Every day | ORAL | 3 refills | Status: DC
  Filled 2020-10-17: qty 21, 21d supply, fill #0

## 2020-10-17 MED ORDER — PALBOCICLIB 100 MG PO TABS
100.0000 mg | ORAL_TABLET | Freq: Every day | ORAL | 2 refills | Status: DC
  Filled 2020-10-17 – 2020-10-18 (×2): qty 21, 21d supply, fill #0
  Filled 2020-10-21: qty 21, 28d supply, fill #0
  Filled 2020-11-11 (×2): qty 21, 28d supply, fill #1
  Filled 2020-12-11: qty 21, 28d supply, fill #2

## 2020-10-17 NOTE — Addendum Note (Signed)
Addended by: Darl Pikes on: 10/17/2020 03:09 PM   Modules accepted: Orders

## 2020-10-17 NOTE — Telephone Encounter (Signed)
Called the patient and asked about symptoms- she says that she feels dry in her nose. She uses the nasal spray twice a day. When she blows it has some blood mixed in it. She does not have clots that come out or bleeding when she is not blowing her nose. She is fatigued a lot. I asked her if she is wanting to decrease dose of ibrance to 100 mg. She would like to do that so she can see if it is better for fatigue. She is having her week off starting today and she will restart the new dose ibrance 100 mg on4/15. New rx was sent to get started on 4/15. Pt agreeable to the plan

## 2020-10-17 NOTE — Telephone Encounter (Signed)
Patient called stating that she is confused as to whether she is better today or not. She reports that she is home today with a headache, bloody discharge from her nose instead of the yellow green she was having,and feeling very tired. Her back is not hurting like it was. She said that she has something going on with her other than her cancer and would like advise.

## 2020-10-18 ENCOUNTER — Other Ambulatory Visit (HOSPITAL_COMMUNITY): Payer: Self-pay

## 2020-10-20 ENCOUNTER — Other Ambulatory Visit (HOSPITAL_COMMUNITY): Payer: Self-pay

## 2020-10-21 ENCOUNTER — Other Ambulatory Visit: Payer: Self-pay

## 2020-10-21 ENCOUNTER — Ambulatory Visit: Payer: 59

## 2020-10-21 ENCOUNTER — Other Ambulatory Visit (HOSPITAL_COMMUNITY): Payer: Self-pay

## 2020-10-21 ENCOUNTER — Ambulatory Visit: Payer: 59 | Admitting: Pharmacist

## 2020-10-21 ENCOUNTER — Ambulatory Visit: Payer: 59 | Admitting: Oncology

## 2020-10-21 ENCOUNTER — Other Ambulatory Visit: Payer: 59

## 2020-10-22 ENCOUNTER — Other Ambulatory Visit: Payer: Self-pay

## 2020-10-22 ENCOUNTER — Encounter: Payer: Self-pay | Admitting: Pharmacist

## 2020-10-23 ENCOUNTER — Other Ambulatory Visit: Payer: Self-pay

## 2020-10-23 ENCOUNTER — Encounter: Payer: Self-pay | Admitting: Radiation Oncology

## 2020-10-23 ENCOUNTER — Ambulatory Visit
Admission: RE | Admit: 2020-10-23 | Discharge: 2020-10-23 | Disposition: A | Discharge status: Home / Self Care | Payer: 59 | Source: Ambulatory Visit | Attending: Radiation Oncology | Admitting: Radiation Oncology

## 2020-10-23 VITALS — BP 128/62 | HR 90 | Temp 98.2°F | Wt 258.0 lb

## 2020-10-23 DIAGNOSIS — C50919 Malignant neoplasm of unspecified site of unspecified female breast: Secondary | ICD-10-CM

## 2020-10-23 DIAGNOSIS — Z170 Estrogen receptor positive status [ER+]: Secondary | ICD-10-CM | POA: Diagnosis not present

## 2020-10-23 DIAGNOSIS — Z923 Personal history of irradiation: Secondary | ICD-10-CM | POA: Diagnosis not present

## 2020-10-23 DIAGNOSIS — Z79811 Long term (current) use of aromatase inhibitors: Secondary | ICD-10-CM | POA: Diagnosis not present

## 2020-10-23 DIAGNOSIS — C7951 Secondary malignant neoplasm of bone: Secondary | ICD-10-CM | POA: Insufficient documentation

## 2020-10-23 DIAGNOSIS — C50611 Malignant neoplasm of axillary tail of right female breast: Secondary | ICD-10-CM | POA: Diagnosis not present

## 2020-10-23 NOTE — Progress Notes (Signed)
Radiation Oncology Follow up Note  Name: Jacqueline Deleon   Date:   10/23/2020 MRN:  [id number] DOB: 08-16-1960    This 60 y.o. female presents to the clinic today for 1 month follow-up status post palliative radiation therapy to her left hip for stage IV metastatic breast cancer.  REFERRING PROVIDER: Derinda Late, MD  HPI: Patient is a 60 year old female now at 1 month having completed palliative radiation therapy to her left hip for stage IV metastatic breast cancer seen today in routine follow-up she is doing well she is at an excellent palliative benefit.  She is ambulating well without pain..  She is currently on letrozole and Ibrance tolerating that well.  I believe she is also receiving letrozole.  COMPLICATIONS OF TREATMENT: none  FOLLOW UP COMPLIANCE: keeps appointments   PHYSICAL EXAM:  BP 128/62   Pulse 90   Temp 98.2 F (36.8 C) (Tympanic)   Wt 258 lb (117 kg)   BMI 45.70 kg/m  Range of motion of her lower extremities does not elicit pain.  She is ambulating well.  Well-developed well-nourished patient in NAD. HEENT reveals PERLA, EOMI, discs not visualized.  Oral cavity is clear. No oral mucosal lesions are identified. Neck is clear without evidence of cervical or supraclavicular adenopathy. Lungs are clear to A&P. Cardiac examination is essentially unremarkable with regular rate and rhythm without murmur rub or thrill. Abdomen is benign with no organomegaly or masses noted. Motor sensory and DTR levels are equal and symmetric in the upper and lower extremities. Cranial nerves II through XII are grossly intact. Proprioception is intact. No peripheral adenopathy or edema is identified. No motor or sensory levels are noted. Crude visual fields are within normal range.  RADIOLOGY RESULTS: No current films to review  PLAN: Present time she is obtained excellent palliative benefit from radiation therapy.  I will turn follow-up care over to medical oncology.  I would be happy to  reevaluate the patient anytime should further treatment be indicated.  Patient knows to call with any concerns.  I would like to take this opportunity to thank you for allowing me to participate in the care of your patient.Noreene Filbert, MD

## 2020-10-24 ENCOUNTER — Inpatient Hospital Stay (HOSPITAL_BASED_OUTPATIENT_CLINIC_OR_DEPARTMENT_OTHER): Payer: 59 | Admitting: Oncology

## 2020-10-24 ENCOUNTER — Telehealth: Payer: Self-pay | Admitting: *Deleted

## 2020-10-24 ENCOUNTER — Inpatient Hospital Stay: Payer: 59 | Attending: Oncology

## 2020-10-24 ENCOUNTER — Other Ambulatory Visit: Payer: Self-pay

## 2020-10-24 ENCOUNTER — Inpatient Hospital Stay: Payer: 59

## 2020-10-24 ENCOUNTER — Telehealth: Payer: Self-pay | Admitting: Pharmacist

## 2020-10-24 VITALS — BP 138/90 | HR 108 | Temp 99.3°F | Resp 22 | Wt 256.9 lb

## 2020-10-24 DIAGNOSIS — C50919 Malignant neoplasm of unspecified site of unspecified female breast: Secondary | ICD-10-CM

## 2020-10-24 DIAGNOSIS — R112 Nausea with vomiting, unspecified: Secondary | ICD-10-CM | POA: Diagnosis not present

## 2020-10-24 DIAGNOSIS — Z5181 Encounter for therapeutic drug level monitoring: Secondary | ICD-10-CM

## 2020-10-24 DIAGNOSIS — C50911 Malignant neoplasm of unspecified site of right female breast: Secondary | ICD-10-CM | POA: Diagnosis not present

## 2020-10-24 DIAGNOSIS — R110 Nausea: Secondary | ICD-10-CM

## 2020-10-24 DIAGNOSIS — Z7983 Long term (current) use of bisphosphonates: Secondary | ICD-10-CM | POA: Diagnosis not present

## 2020-10-24 DIAGNOSIS — Z79899 Other long term (current) drug therapy: Secondary | ICD-10-CM | POA: Diagnosis not present

## 2020-10-24 DIAGNOSIS — C7951 Secondary malignant neoplasm of bone: Secondary | ICD-10-CM | POA: Diagnosis not present

## 2020-10-24 DIAGNOSIS — Z170 Estrogen receptor positive status [ER+]: Secondary | ICD-10-CM | POA: Insufficient documentation

## 2020-10-24 LAB — COMPREHENSIVE METABOLIC PANEL
ALT: 21 U/L (ref 0–44)
AST: 23 U/L (ref 15–41)
Albumin: 4.3 g/dL (ref 3.5–5.0)
Alkaline Phosphatase: 86 U/L (ref 38–126)
Anion gap: 12 (ref 5–15)
BUN: 20 mg/dL (ref 6–20)
CO2: 21 mmol/L — ABNORMAL LOW (ref 22–32)
Calcium: 8.8 mg/dL — ABNORMAL LOW (ref 8.9–10.3)
Chloride: 106 mmol/L (ref 98–111)
Creatinine, Ser: 0.93 mg/dL (ref 0.44–1.00)
GFR, Estimated: >60 mL/min (ref >60)
Glucose, Bld: 120 mg/dL — ABNORMAL HIGH (ref 70–99)
Potassium: 4.3 mmol/L (ref 3.5–5.1)
Sodium: 139 mmol/L (ref 135–145)
Total Bilirubin: 0.5 mg/dL (ref 0.3–1.2)
Total Protein: 7.5 g/dL (ref 6.5–8.1)

## 2020-10-24 LAB — CBC WITH DIFFERENTIAL/PLATELET
Abs Immature Granulocytes: 0.01 10*3/uL (ref 0.00–0.07)
Basophils Absolute: 0 10*3/uL (ref 0.0–0.1)
Basophils Relative: 1 %
Eosinophils Absolute: 0.1 10*3/uL (ref 0.0–0.5)
Eosinophils Relative: 2 %
HCT: 42.2 % (ref 36.0–46.0)
Hemoglobin: 13.6 g/dL (ref 12.0–15.0)
Immature Granulocytes: 0 %
Lymphocytes Relative: 5 %
Lymphs Abs: 0.2 10*3/uL — ABNORMAL LOW (ref 0.7–4.0)
MCH: 27.5 pg (ref 26.0–34.0)
MCHC: 32.2 g/dL (ref 30.0–36.0)
MCV: 85.4 fL (ref 80.0–100.0)
Monocytes Absolute: 0.2 10*3/uL (ref 0.1–1.0)
Monocytes Relative: 6 %
Neutro Abs: 3.3 10*3/uL (ref 1.7–7.7)
Neutrophils Relative %: 86 %
Platelets: 188 10*3/uL (ref 150–400)
RBC: 4.94 MIL/uL (ref 3.87–5.11)
RDW: 26.7 % — ABNORMAL HIGH (ref 11.5–15.5)
WBC: 3.8 10*3/uL — ABNORMAL LOW (ref 4.0–10.5)
nRBC: 0 % (ref 0.0–0.2)

## 2020-10-24 MED ORDER — SODIUM CHLORIDE 0.9 % IV SOLN
Freq: Once | INTRAVENOUS | Status: AC
Start: 2020-10-24 — End: 2020-10-24
  Filled 2020-10-24: qty 250

## 2020-10-24 MED ORDER — ZOLEDRONIC ACID 4 MG/100ML IV SOLN: 4.0000 mg | INTRAVENOUS | Status: DC

## 2020-10-24 MED ORDER — SODIUM CHLORIDE 0.9 % IV SOLN
Freq: Once | INTRAVENOUS | Status: AC
  Filled 2020-10-24: qty 250

## 2020-10-24 MED ORDER — ZOLEDRONIC ACID 4 MG/5ML IV CONC
4.0000 mg | Freq: Once | INTRAVENOUS | Status: AC
Start: 2020-10-24 — End: 2020-10-24
  Administered 2020-10-24: 4 mg via INTRAVENOUS
  Filled 2020-10-24: qty 5

## 2020-10-24 MED ORDER — PROCHLORPERAZINE EDISYLATE 10 MG/2ML IJ SOLN
10.0000 mg | Freq: Once | INTRAMUSCULAR | Status: AC
Start: 2020-10-24 — End: 2020-10-24
  Administered 2020-10-24: 10 mg via INTRAVENOUS
  Filled 2020-10-24: qty 2

## 2020-10-24 MED ORDER — ONDANSETRON HCL 4 MG PO TABS
4.0000 mg | ORAL_TABLET | Freq: Three times a day (TID) | ORAL | 0 refills | Status: DC | PRN
  Filled 2020-10-24: qty 30, 10d supply, fill #0

## 2020-10-24 NOTE — Telephone Encounter (Signed)
Patient called reporting that she has vomited 3 times since getting up this morning, when asked if she has any fever , she stated no, I think I just ate something last night that didn't agree with me. She was asking if she should still come to her appointment today and I advised her that yes, she should keep appointment

## 2020-10-24 NOTE — Progress Notes (Signed)
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 It was a 1.4 cm tumor with negative lymph nodes grade 1 and patient went on to get lumpectomy followed by adjuvant radiation therapy. Oncotype DX score was 6 and she did not require adjuvant chemotherapy. She was on tamoxifen for 5 years. She had BRCA testing done at that time which was negative.  Patient felt a lump in her right axilla in September 2021. She underwent ultrasound of bilateral breasts which did not pick up any axillary mass. A prior screening mammogram in September 2021 was unremarkable. She was then admitted for Covid pneumonia and underwent CT angio chest on 07/22/2020 which incidentally picked up an axillary mass measuring 2.5 cm. No obvious breast mass. Axillary mass was biopsied and was consistent with high-grade invasive mammary carcinoma. IHC was positive for GATA3 with diffuse strong nuclear staining. There was no lymph node architecture identified in the sample and it could not be determined if it is a primary tumor within the axillary breast tissue or metastases.ER strongly positive greater than 90%, PR 1 to 10% positive and HER-2 negative  PET CT scan showed hypermetabolic poorly marginated solid right axillary mass 2.5 x 2 cm with an SUV of 5.6. No enlarged hypermetabolic mediastinal or  hilar lymph nodes no enlarged left axillary lymph nodes. Subcentimeter lung nodules below PET resolution. Small hypermetabolism in the right liver with an SUV of 6 without CT correlate equivocal for liver metastases. Multiple hypermetabolic faintly lytic osseous lesions throughout the lumbar spine and bilateral pelvic girdle with representative supra-acetabular right iliac bone 1.9 cm lesion, anterior superior left acetabular 1.7 cm lesion and L1 1.2 cm lesion.  NGS testing showed no actionable mutations.  PD-L1 less than 1%.  CHEK2 S422 FS, K3 CA and 1068 FS, PICC 3 CA and 345H, CCN E1 gain, ERB B2 1 P-CSF 1 hour fusion, FGF 19 gain, FGF 3 gain, FGF 4gain Ibrance plus letrozole started in February 2022.  Baseline tumor markers not elevated  Interval history- patient reports having some nausea and a few episodes of vomiting since yesterday night.  She went out to eat at a restaurant.  She is about to start [facility] tomorrow. She reports new low back pain that has been ongoing for last 2 weeks. Sometimes it radiates around the side of the back. Denies any weakness or numbness in b/l LE  ECOG PS- 1 Pain scale- 0   Review of systems- Review of Systems  Constitutional: Positive for malaise/fatigue. Negative for chills, fever and weight loss.  HENT: Negative for congestion, ear discharge and nosebleeds.   Eyes: Negative for blurred vision.  Respiratory: Negative for cough, hemoptysis, sputum production, shortness of breath and wheezing.   Cardiovascular: Negative for chest pain, palpitations, orthopnea and claudication.  Gastrointestinal: Positive for nausea. Negative for abdominal pain, blood in stool, constipation, diarrhea, heartburn, melena and vomiting.  Genitourinary: Negative for dysuria, flank pain, frequency, hematuria  and urgency.  Musculoskeletal: Positive for back pain. Negative for joint pain and myalgias.  Skin: Negative for rash.  Neurological: Negative for dizziness, tingling, focal  weakness, seizures, weakness and headaches.  Endo/Heme/Allergies: Does not bruise/bleed easily.  Psychiatric/Behavioral: Negative for depression and suicidal ideas. The patient does not have insomnia.        Allergies  Allergen Reactions  . Kiwi Extract Itching and Swelling    The real kiwi fruit  . Codeine Other (See Comments)    Felt funny.  Lorenza Chick Extract [Nutritional Supplements] Itching and Swelling    Raw Grapes only if cooked it is ok  . Amoxicillin Rash  . Erythromycin Rash  . Sulfa Antibiotics Rash  . Tape Rash    Adhesives  Pt can have paper tape     Past Medical History:  Diagnosis Date  . Allergic genetic state   . Arthritis   . BRCA negative 2004  . Breast cancer ([place])    2009 infiltrating ductal cancer of right breast  . Breast mass 08/2006, 03/2009   left breast biopsy fibroadenoma (2008) right breast biopsy benign (2010)  . 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 Central serous retinopathy   . CHEK2-related breast cancer ([place]) 07/2020  . Chicken pox   . Depression   . Fatty liver   . Fatty liver   . Gastric polyps   . GERD (gastroesophageal reflux disease)   . Gestational diabetes    prediabetes out side of having baby  . Headache    migraines  . Heart murmur   . History of abnormal mammogram 08/2006, 01/2008, 03/2009  . Hypertension   . IBS (irritable bowel syndrome)   . Joint disorder    hx of left ankle tendonitis, bursitis, heel spur  . 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 Obesity 2018   BMI 45  . Pelvic pain    adhesions and adenomyosis  . Personal history of radiation therapy   . Rosacea      Past Surgical History:  Procedure Laterality Date  . ABDOMINAL HYSTERECTOMY    . BREAST BIOPSY Left 08/2006   benign fibroadenoma  . BREAST BIOPSY Right 08/11/2020   [place] bx of LN, hydromarker, Invasive mammary carcinoma  . BREAST EXCISIONAL BIOPSY Right 02/26/2008    right breast invasive mam ca with rad partial mastectomy   . BREAST SURGERY Right    x2/ Dr Tamala Julian  . CARDIAC CATHETERIZATION  2006  . CESAREAN SECTION  04/02/1998  . CHOLECYSTECTOMY  04/2010   Dr. Smith/ laprascopic surgery  . COLONOSCOPY  04/04/2000  . COLONOSCOPY WITH PROPOFOL N/A 02/12/2019   Procedure: COLONOSCOPY WITH PROPOFOL;  Surgeon: Lollie Sails, MD;  Location[facility] END[facility]COPY;  Service: Endoscopy;  Laterality: N/A;  . ESOPHAGOGASTRODUODENOSCOPY (EGD) WITH PROPOFOL N/A 05/01/2015   Procedure: ESOPHAGOGASTRODUODENOSCOPY (EGD) WITH PROPOFOL;  Surgeon: Hulen Luster, MD;  Location: [facility] ENDOSCOPY;  Service: Gastroenterology;  Laterality: N/A;  . ESOPHAGOGASTRODUODENOSCOPY (EGD) WITH PROPOFOL N/A 02/12/2019   Procedure: ESOPHAGOGASTRODUODENOSCOPY (EGD) WITH PROPOFOL;  Surgeon: Lollie Sails, MD;  Location: [facility] ENDOSCOPY;  Service: Endoscopy;  Laterality: N/A;  . EYE SURGERY  08/2012   laser surgery on retina/ DR Appenzeller  . FINGER SURGERY     repair of tendon in right index, Dr. Francesco Sor in [place]  . FOOT SURGERY     Dr. Milinda Pointer  . IRRIGATION AND DEBRIDEMENT SEBACEOUS  CYST     right upper back  . LAPAROSCOPIC SUPRACERVICAL HYSTERECTOMY  06/2007   Dr Kincius/ adhesions/CPP/adenomyosis  . MASTECTOMY PARTIAL / LUMPECTOMY Right 01/2008   with sentinal lymph node biopsy/ infiltrating ductal cancer  . REPAIR PERONEAL TENDONS ANKLE  10/2019   with tarsal exostectomy and sural neuroma excision on right   . SKIN CANCER EXCISION     back and calves  . WISDOM TOOTH EXTRACTION  1991    Social History   Socioeconomic History  . Marital status: Married    Spouse name: Richardson Landry  . Number of children: 1  . Years of education: 10  . Highest education level: Not on file  Occupational History  . Occupation: CMA  Tobacco Use  . Smoking status: Never Smoker  . Smokeless tobacco: Never Used  Vaping Use  . Vaping Use: Never used  Substance and Sexual Activity  . Alcohol use:  Yes    Alcohol/week: 0.0 standard drinks    Comment: rare, 1-2 drinks per year  . Drug use: No  . Sexual activity: Yes    Partners: Male    Birth control/protection: Surgical    Comment: Hysterectomy   Other Topics Concern  . Not on file  Social History Narrative   Lives in [place] with husband, no pets.  Works at [employer].      Diet - regular   Exercise - occasional   Social Determinants of Health   Financial Resource Strain: Not on file  Food Insecurity: Not on file  Transportation Needs: Not on file  Physical Activity: Not on file  Stress: Not on file  Social Connections: Not on file  Intimate Partner Violence: Not on file    Family History  Problem Relation Age of Onset  . Hypertension Mother   . Thyroid disease Mother   . Breast cancer Mother 38  . Colon cancer Mother 31       again at 43  . Atrial fibrillation Mother   . Hip fracture Mother   . Skin cancer Mother   . Stroke Father   . Hypertension Father   . Cancer Father 78       prostate  . Parkinson's disease Father   . Skin cancer Father   . Hypertension Sister   . Thyroid disease Sister   . Cancer Sister        skin/ squamous cell  . Lung cancer Paternal Aunt 80       smoker  . Diabetes Maternal Grandmother   . Stroke Maternal Grandfather   . Heart disease Paternal Grandfather   . Diabetes Maternal Aunt   . Lymphoma Maternal Uncle   . Thyroid cancer Cousin        maternal  . Heart disease Maternal Uncle   . Cancer Maternal Aunt   . Breast cancer Maternal Aunt 75  . Breast cancer Cousin 60       maternal  . Breast cancer Cousin 54  . Colon cancer Cousin      Current Outpatient Medications:  .  acetaminophen (TYLENOL) 325 MG tablet, Take 650 mg by mouth every 6 (six) hours as needed for mild pain or headache., Disp: , Rfl:  .  albuterol (VENTOLIN HFA) 108 (90 Base) MCG/ACT inhaler, INHALE 2 INHALATIONS INTO THE LUNGS EVERY 4 HOURS AS NEEDED FOR SHORTNESS OF BREATH, Disp: 18 g, Rfl: 0 .   albuterol (VENTOLIN HFA) 108 (90 Base) MCG/ACT inhaler, Inhale 2 inhalations into the lungs every 4 (four)  hours as needed for Shortness of Breath, Disp: 18 g, Rfl: 5 .  buPROPion (WELLBUTRIN XL) 300 MG 24 hr tablet, Take 300 mg by mouth at bedtime., Disp: , Rfl:  .  buPROPion (WELLBUTRIN XL) 300 MG 24 hr tablet, TAKE 1 TABLET BY MOUTH ONCE DAILY, Disp: 90 tablet, Rfl: 1 .  citalopram (CELEXA) 40 MG tablet, Take 40 mg by mouth at bedtime., Disp: , Rfl:  .  clotrimazole (LOTRIMIN) 1 % cream, Apply 1 application topically daily. groin, Disp: , Rfl:  .  clotrimazole-betamethasone (LOTRISONE) cream, APPLY EXTERNALLY TWICE A DAY FOR 2 WEEKS, Disp: 45 g, Rfl: 0 .  COVID-19 At Home Antigen Test KIT, USE AS DIRECTED WITHIN PACKAGE INSTRUCTIONS (Patient taking differently: USE AS DIRECTED WITHIN PACKAGE INSTRUCTIONS), Disp: 4 kit, Rfl: 0 .  COVID-19 mRNA vaccine, Pfizer, 30 MCG/0.3ML injection, USE AS DIRECTED, Disp: .3 mL, Rfl: 0 .  diltiazem (CARDIZEM CD) 120 MG 24 hr capsule, TAKE 1 CAPSULE BY MOUTH DAILY., Disp: 30 capsule, Rfl: 10 .  diphenhydrAMINE (BENADRYL) 25 MG tablet, Take 25 mg by mouth at bedtime as needed for sleep., Disp: , Rfl:  .  fluconazole (DIFLUCAN) 150 MG tablet, TAKE 1 TABLET BY MOUTH ONCE FOR 1 DOSE. MAY REPEAT IN 3 DAYS IF STILL HAVING SYMPTOMS, Disp: 2 tablet, Rfl: 0 .  fluticasone (FLOVENT HFA) 110 MCG/ACT inhaler, INHALE 1 PUFF BY MOUTH 2 TIMES DAILY, Disp: 12 g, Rfl: 1 .  gabapentin (NEURONTIN) 100 MG capsule, TAKE 1 CAPSULE BY MOUTH 3 TIMES DAILY, Disp: 90 capsule, Rfl: 0 .  gabapentin (NEURONTIN) 100 MG capsule, Take 1 capsule (100 mg total) by mouth 3 (three) times daily, Disp: 270 capsule, Rfl: 1 .  ketotifen (ZADITOR) 0.025 % ophthalmic solution, Place 1 drop into both eyes daily., Disp: , Rfl:  .  letrozole (FEMARA) 2.5 MG tablet, TAKE 1 TABLET (2.5 MG TOTAL) BY MOUTH DAILY., Disp: 90 tablet, Rfl: 4 .  lisinopril (PRINIVIL,ZESTRIL) 10 MG tablet, Take 10 mg by mouth at  bedtime., Disp: , Rfl:  .  lisinopril (ZESTRIL) 10 MG tablet, TAKE 1 TABLET BY MOUTH ONCE DAILY, Disp: 90 tablet, Rfl: 1 .  meloxicam (MOBIC) 7.5 MG tablet, TAKE 1 TABLET BY MOUTH ONCE DAILY AS NEEDED., Disp: 30 tablet, Rfl: 0 .  Meloxicam 7.5 MG TBDP, Take 7.5 mg by mouth daily as needed., Disp: 30 tablet, Rfl: 0 .  metFORMIN (GLUCOPHAGE-XR) 500 MG 24 hr tablet, Take 500 mg by mouth in the morning and at bedtime., Disp: , Rfl:  .  metFORMIN (GLUCOPHAGE-XR) 500 MG 24 hr tablet, TAKE 1 TABLET BY MOUTH TWICE DAILY WITH MEALS, Disp: 180 tablet, Rfl: 1 .  Multiple Vitamin (MULTIVITAMIN) tablet, Take 1 tablet by mouth daily., Disp: , Rfl:  .  omeprazole (PRILOSEC) 20 MG capsule, Take 20 mg by mouth daily., Disp: , Rfl:  .  omeprazole (PRILOSEC) 20 MG capsule, TAKE 1 CAPSULE BY MOUTH 2 TIMES DAILY TAKE 30 MIN BEFORE MEALS., Disp: 180 capsule, Rfl: 2 .  oxyCODONE (OXY IR/ROXICODONE) 5 MG immediate release tablet, TAKE 1 TABLET (5 MG TOTAL) BY MOUTH EVERY 6 (SIX) HOURS AS NEEDED FOR SEVERE PAIN., Disp: 60 tablet, Rfl: 0 .  palbociclib (IBRANCE) 100 MG tablet, Take 1 tablet (100 mg total) by mouth daily. Take for 21 days on, 7 days off, repeat every 28 days., Disp: 21 tablet, Rfl: 2  Physical exam: There were no vitals filed for this visit. Physical Exam HENT:     Head: Normocephalic and  atraumatic.  Eyes:     Pupils: Pupils are equal, round, and reactive to light.  Cardiovascular:     Rate and Rhythm: Normal rate and regular rhythm.     Heart sounds: Normal heart sounds.  Pulmonary:     Effort: Pulmonary effort is normal.     Breath sounds: Normal breath sounds.  Abdominal:     General: Bowel sounds are normal.     Palpations: Abdomen is soft.  Musculoskeletal:     Cervical back: Normal range of motion.  Skin:    General: Skin is warm and dry.  Neurological:     Mental Status: She is alert and oriented to person, place, and time.      CMP Latest Ref Rng & Units 10/07/2020  Glucose 70 - 99  mg/dL 115(H)  BUN 6 - 20 mg/dL 21(H)  Creatinine 0.44 - 1.00 mg/dL 1.10(H)  Sodium 135 - 145 mmol/L 138  Potassium 3.5 - 5.1 mmol/L 4.3  Chloride 98 - 111 mmol/L 104  CO2 22 - 32 mmol/L 23  Calcium 8.9 - 10.3 mg/dL 9.0  Total Protein 6.5 - 8.1 g/dL 7.5  Total Bilirubin 0.3 - 1.2 mg/dL 0.5  Alkaline Phos 38 - 126 U/L 94  AST 15 - 41 U/L 18  ALT 0 - 44 U/L 19   CBC Latest Ref Rng & Units 10/24/2020  WBC 4.0 - 10.5 K/uL 3.8(L)  Hemoglobin 12.0 - 15.0 g/dL 13.6  Hematocrit 36.0 - 46.0 % 42.2  Platelets 150 - 400 K/uL 188     Assessment and plan- Patient is a 60 y.o. female with metastatic ER positive breast cancer with bone metastases on letrozole and Ibrance.  She is here for routine follow-up  Metastatic breast cancer: This would be her second month of Ibrance with letrozole.  We have dose reduced Ibrance to 100 mg due to increasing fatigue. I have asked her to postpone cycle by 1 week. Repeat ct and bone scan in 1 month  Bone metastases: Will receive Zometa today. Continue prn oxycodone  Low back pain- she did have hypermetabolic L1 lesion. If pain increases will proceed with MRI spine  Nausea/ vomiting- started yesterday. Possibly related to food that she ate out. Will give 1L IVF today with IV compazine. Nausea meds to be sent for home use prn.  I will see her back in 1 month with CBC with differential, CMP for Zometa.  Plan to repeat CT chest abdomen and pelvis with contrast and bone scan in about 5 weeks time   Visit Diagnosis 1. High risk medication use   2. Metastatic breast cancer ([place])   3. Encounter for monitoring zoledronic acid therapy      Dr. Randa Evens, MD, MPH [facility] [phone] 10/24/2020 9:59 AM

## 2020-10-24 NOTE — Telephone Encounter (Signed)
[place]  Telephone:([phone] [phone]  Patient Care Team: Derinda Late, MD as PCP - General (Family Medicine) Vickie Epley, MD as PCP - Electrophysiology (Cardiology)   Name of the patient: Jacqueline Deleon  [id number]  Mar 16, 1961   Date of visit: 09/23/20  HPI: Patient is a 60 y.o. female with a newly diagnosed ER/PR positive, HER2 negative breast cancer. Planned treatment with Ibrance (palbociclib) and letrozole. Ibrance started on 08/26/20.  Reason for Consult: Oral chemotherapy follow-up for palbociclib therapy.   PAST MEDICAL HISTORY: Past Medical History:  Diagnosis Date  . Allergic genetic state   . Arthritis   . BRCA negative 2004  . Breast cancer ([place])    2009 infiltrating ductal cancer of right breast  . Breast mass 08/2006, 03/2009   left breast biopsy fibroadenoma (2008) right breast biopsy benign (2010)  . 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 Central serous retinopathy   . 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 Chicken pox   . Depression   . Fatty liver   . Fatty liver   . Gastric polyps   . GERD (gastroesophageal reflux disease)   . Gestational diabetes    prediabetes out side of having baby  . Headache    migraines  . Heart murmur   . History of abnormal mammogram 08/2006, 01/2008, 03/2009  . Hypertension   . IBS (irritable bowel syndrome)   . Joint disorder    hx of left ankle tendonitis, bursitis, heel spur  . 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 Obesity 2018   BMI 45  . Pelvic pain    adhesions and adenomyosis  . Personal history of radiation therapy   . Rosacea     PAST SURGICAL HISTORY:  Past Surgical History:  Procedure Laterality Date  . ABDOMINAL HYSTERECTOMY    . BREAST BIOPSY Left 08/2006   benign fibroadenoma  . BREAST BIOPSY Right 08/11/2020   [place] bx of LN, hydromarker, Invasive  mammary carcinoma  . BREAST EXCISIONAL BIOPSY Right 02/26/2008   right breast invasive mam ca with rad partial mastectomy   . BREAST SURGERY Right    x2/ Dr Tamala Julian  . CARDIAC CATHETERIZATION  2006  . CESAREAN SECTION  04/02/1998  . CHOLECYSTECTOMY  04/2010   Dr. Smith/ laprascopic surgery  . COLONOSCOPY  04/04/2000  . COLONOSCOPY WITH PROPOFOL N/A 02/12/2019   Procedure: COLONOSCOPY WITH PROPOFOL;  Surgeon: Lollie Sails, MD;  [facility]: [facility] ENDOSCOPY;  Service: Endoscopy;  Laterality: N/A;  . ESOPHAGOGASTRODUODENOSCOPY (EGD) WITH PROPOFOL N/A 05/01/2015   Procedure: ESOPHAGOGASTRODUODENOSCOPY (EGD) WITH PROPOFOL;  Surgeon: Hulen Luster, MD;  Location: [facility] ENDOSCOPY;  Service: Gastroenterology;  Laterality: N/A;  . ESOPHAGOGASTRODUODENOSCOPY (EGD) WITH PROPOFOL N/A 02/12/2019   Procedure: ESOPHAGOGASTRODUODENOSCOPY (EGD) WITH PROPOFOL;  Surgeon: Lollie Sails, MD;  Location: [facility] ENDOSCOPY;  Service: Endoscopy;  Laterality: N/A;  . EYE SURGERY  08/2012   laser surgery on retina/ DR Appenzeller  . FINGER SURGERY     repair of tendon in right index, Dr. Francesco Sor in [place]  . FOOT SURGERY     Dr. Milinda Pointer  . IRRIGATION AND DEBRIDEMENT SEBACEOUS CYST     right upper back  . LAPAROSCOPIC SUPRACERVICAL HYSTERECTOMY  06/2007   Dr Kincius/ adhesions/CPP/adenomyosis  . MASTECTOMY PARTIAL / LUMPECTOMY Right 01/2008   with sentinal lymph node biopsy/ infiltrating ductal cancer  .  REPAIR PERONEAL TENDONS ANKLE  10/2019   with tarsal exostectomy and sural neuroma excision on right   . SKIN CANCER EXCISION     back and calves  . WISDOM TOOTH EXTRACTION  1991    HEMATOLOGY/ONCOLOGY HISTORY:  Oncology History  Metastatic breast cancer ([place])  08/19/2020 Cancer Staging   Staging form: Breast, AJCC 8th Edition - Clinical stage from 08/19/2020: Stage IV (rcTX, cNX, cM1, G3, ER+, PR+, HER2-) - Signed by Sindy Guadeloupe, MD on 08/23/2020 Stage prefix: Recurrence   08/23/2020 Initial Diagnosis    Metastatic breast cancer ([place])   08/23/2020 -  Chemotherapy    Patient is on Treatment Plan: BREAST PALBOCICLIB / LETROZOLE Q28D        ALLERGIES:  is allergic to kiwi extract, codeine, grapeseed extract [nutritional supplements], amoxicillin, erythromycin, sulfa antibiotics, and tape.  MEDICATIONS:  Current Outpatient Medications  Medication Sig Dispense Refill  . acetaminophen (TYLENOL) 325 MG tablet Take 650 mg by mouth every 6 (six) hours as needed for mild pain or headache.    . albuterol (VENTOLIN HFA) 108 (90 Base) MCG/ACT inhaler INHALE 2 INHALATIONS INTO THE LUNGS EVERY 4 HOURS AS NEEDED FOR SHORTNESS OF BREATH 18 g 0  . albuterol (VENTOLIN HFA) 108 (90 Base) MCG/ACT inhaler Inhale 2 inhalations into the lungs every 4 (four) hours as needed for Shortness of Breath 18 g 5  . buPROPion (WELLBUTRIN XL) 300 MG 24 hr tablet Take 300 mg by mouth at bedtime.    Marland Kitchen buPROPion (WELLBUTRIN XL) 300 MG 24 hr tablet TAKE 1 TABLET BY MOUTH ONCE DAILY 90 tablet 1  . citalopram (CELEXA) 40 MG tablet Take 40 mg by mouth at bedtime.    . clotrimazole (LOTRIMIN) 1 % cream Apply 1 application topically daily. groin    . clotrimazole-betamethasone (LOTRISONE) cream APPLY EXTERNALLY TWICE A DAY FOR 2 WEEKS 45 g 0  . COVID-19 At Home Antigen Test KIT USE AS DIRECTED WITHIN PACKAGE INSTRUCTIONS (Patient taking differently: USE AS DIRECTED WITHIN PACKAGE INSTRUCTIONS) 4 kit 0  . COVID-19 mRNA vaccine, Pfizer, 30 MCG/0.3ML injection USE AS DIRECTED .3 mL 0  . diltiazem (CARDIZEM CD) 120 MG 24 hr capsule TAKE 1 CAPSULE BY MOUTH DAILY. 30 capsule 10  . diphenhydrAMINE (BENADRYL) 25 MG tablet Take 25 mg by mouth at bedtime as needed for sleep.    . fluconazole (DIFLUCAN) 150 MG tablet TAKE 1 TABLET BY MOUTH ONCE FOR 1 DOSE. MAY REPEAT IN 3 DAYS IF STILL HAVING SYMPTOMS 2 tablet 0  . fluticasone (FLOVENT HFA) 110 MCG/ACT inhaler INHALE 1 PUFF BY MOUTH 2 TIMES DAILY 12 g 1  . gabapentin (NEURONTIN) 100 MG capsule  TAKE 1 CAPSULE BY MOUTH 3 TIMES DAILY 90 capsule 0  . gabapentin (NEURONTIN) 100 MG capsule Take 1 capsule (100 mg total) by mouth 3 (three) times daily 270 capsule 1  . ketotifen (ZADITOR) 0.025 % ophthalmic solution Place 1 drop into both eyes daily.    Marland Kitchen letrozole (FEMARA) 2.5 MG tablet TAKE 1 TABLET (2.5 MG TOTAL) BY MOUTH DAILY. 90 tablet 4  . lisinopril (PRINIVIL,ZESTRIL) 10 MG tablet Take 10 mg by mouth at bedtime.    Marland Kitchen lisinopril (ZESTRIL) 10 MG tablet TAKE 1 TABLET BY MOUTH ONCE DAILY 90 tablet 1  . meloxicam (MOBIC) 7.5 MG tablet TAKE 1 TABLET BY MOUTH ONCE DAILY AS NEEDED. 30 tablet 0  . Meloxicam 7.5 MG TBDP Take 7.5 mg by mouth daily as needed. 30 tablet 0  . metFORMIN (GLUCOPHAGE-XR)  500 MG 24 hr tablet Take 500 mg by mouth in the morning and at bedtime.    . metFORMIN (GLUCOPHAGE-XR) 500 MG 24 hr tablet TAKE 1 TABLET BY MOUTH TWICE DAILY WITH MEALS 180 tablet 1  . Multiple Vitamin (MULTIVITAMIN) tablet Take 1 tablet by mouth daily.    Marland Kitchen omeprazole (PRILOSEC) 20 MG capsule Take 20 mg by mouth daily.    Marland Kitchen omeprazole (PRILOSEC) 20 MG capsule TAKE 1 CAPSULE BY MOUTH 2 TIMES DAILY TAKE 30 MIN BEFORE MEALS. 180 capsule 2  . ondansetron (ZOFRAN) 4 MG tablet Take 1 tablet (4 mg total) by mouth every 8 (eight) hours as needed for nausea or vomiting. 30 tablet 0  . oxyCODONE (OXY IR/ROXICODONE) 5 MG immediate release tablet TAKE 1 TABLET (5 MG TOTAL) BY MOUTH EVERY 6 (SIX) HOURS AS NEEDED FOR SEVERE PAIN. 60 tablet 0  . palbociclib (IBRANCE) 100 MG tablet Take 1 tablet (100 mg total) by mouth daily. Take for 21 days on, 7 days off, repeat every 28 days. 21 tablet 2   No current facility-administered medications for this visit.    VITAL SIGNS: There were no vitals taken for this visit. There were no vitals filed for this visit.  Estimated body mass index is 45.51 kg/m as calculated from the following:   Height as of 10/06/20: $RemoveBef'5\' 3"'YMLNePsRiC$  (1.6 m).   Weight as of an earlier encounter on 10/24/20:  116.5 kg (256 lb 14.4 oz).  LABS: CBC:    Component Value Date/Time   WBC 3.8 (L) 10/24/2020 0933   HGB 13.6 10/24/2020 0933   HGB 11.9 03/06/2020 0811   HCT 42.2 10/24/2020 0933   HCT 38.6 03/06/2020 0811   PLT 188 10/24/2020 0933   PLT 253 03/06/2020 0811   MCV 85.4 10/24/2020 0933   MCV 80 03/06/2020 0811   NEUTROABS 3.3 10/24/2020 0933   NEUTROABS 4.7 03/06/2020 0811   LYMPHSABS 0.2 (L) 10/24/2020 0933   LYMPHSABS 1.6 03/06/2020 0811   MONOABS 0.2 10/24/2020 0933   EOSABS 0.1 10/24/2020 0933   EOSABS 0.1 03/06/2020 0811   BASOSABS 0.0 10/24/2020 0933   BASOSABS 0.0 03/06/2020 0811   Comprehensive Metabolic Panel:    Component Value Date/Time   NA 139 10/24/2020 0933   NA 141 03/06/2020 0811   K 4.3 10/24/2020 0933   CL 106 10/24/2020 0933   CO2 21 (L) 10/24/2020 0933   BUN 20 10/24/2020 0933   BUN 18 03/06/2020 0811   CREATININE 0.93 10/24/2020 0933   GLUCOSE 120 (H) 10/24/2020 0933   CALCIUM 8.8 (L) 10/24/2020 0933   AST 23 10/24/2020 0933   ALT 21 10/24/2020 0933   ALKPHOS 86 10/24/2020 0933   BILITOT 0.5 10/24/2020 0933   BILITOT <0.2 03/06/2020 0811   PROT 7.5 10/24/2020 0933   PROT 6.6 03/06/2020 0811   ALBUMIN 4.3 10/24/2020 0933   ALBUMIN 4.2 03/06/2020 0811    RADIOGRAPHIC STUDIES: No results found.   Assessment and Plan-  CMP and CBC from today reviewed, ANC, AST/ALT, and T.bili within normal limits.   Ms. Yau will hold on restarting her Ibrance until her nausea is better controlled. She will call me when she feels okay enough to resume  Her Ibrance dose was reduced to $RemoveBe'100mg'KQoTkwxmh$  for the next cycle due to fatigue during cycle 2.   Oral Chemotherapy Side Effect/Intolerance:  Nausea: She had had nausea and vomiting since her dinner last night. Her las incidence of vomiting was about 6 hours ago. She feels  like the nausea might be from what she ate last night.   She received IV prochlorperazine and fluid while in clinic today. She picked up her  new ondansteron prescription from the pharmacy today. I encouraged her to go ahead and start using her ondansetron now because she is currently still nauseous. She should continue to use he ondansetron every 8 hours until her nausea subsides.   She knows to call back if the nausea continues into early next week.  Patient expressed understanding and was in agreement with this plan. She also understands that She can call clinic at any time with any questions, concerns, or complaints.   Thank you for allowing me to participate in the care of this very pleasant patient.   Time Total: 15 mins  Visit consisted of counseling and education on dealing with issues of symptom management in the setting of serious and potentially life-threatening illness.Greater than 50%  of this time was spent counseling and coordinating care related to the above assessment and plan.  Signed by: Darl Pikes, PharmD, BCPS, Salley Slaughter, CPP Hematology/Oncology Clinical Pharmacist Practitioner [facility]/HP/AP [facility] [phone]  10/24/2020 4:48 PM

## 2020-10-24 NOTE — Progress Notes (Signed)
Patient states nausea improved after compazine

## 2020-10-25 LAB — CANCER ANTIGEN 27.29: CA 27.29: 27.1 U/mL (ref 0.0–38.6)

## 2020-10-26 ENCOUNTER — Encounter: Payer: Self-pay | Admitting: Oncology

## 2020-10-27 ENCOUNTER — Other Ambulatory Visit: Payer: Self-pay

## 2020-10-27 ENCOUNTER — Telehealth: Payer: Self-pay | Admitting: *Deleted

## 2020-10-27 NOTE — Telephone Encounter (Signed)
Patient called requesting that her letter for work be updated to include this next month. Please call her when ready

## 2020-10-28 ENCOUNTER — Other Ambulatory Visit: Payer: Self-pay

## 2020-10-29 ENCOUNTER — Encounter: Payer: Self-pay | Admitting: *Deleted

## 2020-10-29 ENCOUNTER — Other Ambulatory Visit: Payer: Self-pay | Admitting: *Deleted

## 2020-10-29 ENCOUNTER — Other Ambulatory Visit: Payer: Self-pay

## 2020-10-30 ENCOUNTER — Other Ambulatory Visit: Payer: Self-pay

## 2020-10-30 NOTE — Telephone Encounter (Signed)
I sent her a message yest. And I sent her the letter and went through her email to have it

## 2020-11-04 ENCOUNTER — Other Ambulatory Visit: Payer: Self-pay

## 2020-11-05 ENCOUNTER — Other Ambulatory Visit: Payer: Self-pay

## 2020-11-06 ENCOUNTER — Other Ambulatory Visit: Payer: Self-pay

## 2020-11-11 ENCOUNTER — Other Ambulatory Visit: Payer: Self-pay

## 2020-11-11 ENCOUNTER — Other Ambulatory Visit (HOSPITAL_COMMUNITY): Payer: Self-pay

## 2020-11-11 DIAGNOSIS — C50919 Malignant neoplasm of unspecified site of unspecified female breast: Secondary | ICD-10-CM | POA: Insufficient documentation

## 2020-11-11 DIAGNOSIS — Z1331 Encounter for screening for depression: Secondary | ICD-10-CM | POA: Diagnosis not present

## 2020-11-11 DIAGNOSIS — E1159 Type 2 diabetes mellitus with other circulatory complications: Secondary | ICD-10-CM | POA: Diagnosis not present

## 2020-11-11 DIAGNOSIS — Z0000 Encounter for general adult medical examination without abnormal findings: Secondary | ICD-10-CM | POA: Diagnosis not present

## 2020-11-12 ENCOUNTER — Other Ambulatory Visit (HOSPITAL_COMMUNITY): Payer: Self-pay

## 2020-11-13 ENCOUNTER — Other Ambulatory Visit (HOSPITAL_COMMUNITY): Payer: Self-pay

## 2020-11-13 MED FILL — Diltiazem HCl Coated Beads Cap ER 24HR 120 MG: ORAL | 30 days supply | Qty: 30 | Fill #0 | Status: AC

## 2020-11-17 ENCOUNTER — Other Ambulatory Visit (HOSPITAL_COMMUNITY): Payer: Self-pay

## 2020-11-17 MED FILL — Letrozole Tab 2.5 MG: ORAL | 90 days supply | Qty: 90 | Fill #0 | Status: AC

## 2020-11-20 ENCOUNTER — Other Ambulatory Visit (HOSPITAL_COMMUNITY): Payer: Self-pay

## 2020-11-25 ENCOUNTER — Other Ambulatory Visit: Payer: 59

## 2020-11-26 ENCOUNTER — Encounter: Payer: Self-pay | Admitting: Cardiology

## 2020-11-26 ENCOUNTER — Other Ambulatory Visit: Payer: Self-pay

## 2020-11-26 ENCOUNTER — Ambulatory Visit (INDEPENDENT_AMBULATORY_CARE_PROVIDER_SITE_OTHER): Payer: 59 | Admitting: Cardiology

## 2020-11-26 VITALS — BP 124/82 | HR 80 | Ht 63.0 in | Wt 260.0 lb

## 2020-11-26 DIAGNOSIS — K7460 Unspecified cirrhosis of liver: Secondary | ICD-10-CM

## 2020-11-26 DIAGNOSIS — C50919 Malignant neoplasm of unspecified site of unspecified female breast: Secondary | ICD-10-CM

## 2020-11-26 DIAGNOSIS — I4892 Unspecified atrial flutter: Secondary | ICD-10-CM | POA: Diagnosis not present

## 2020-11-26 NOTE — Patient Instructions (Signed)
Medication Instructions:  Your physician recommends that you continue on your current medications as directed. Please refer to the Current Medication list given to you today. *If you need a refill on your cardiac medications before your next appointment, please call your pharmacy*  Lab Work: None ordered. If you have labs (blood work) drawn today and your tests are completely normal, you will receive your results only by: . MyChart Message (if you have MyChart) OR . A paper copy in the mail If you have any lab test that is abnormal or we need to change your treatment, we will call you to review the results.  Testing/Procedures: None ordered.  Follow-Up: At [facility], you and your health needs are our priority.  As part of our continuing mission to provide you with exceptional heart care, we have created designated Provider Care Teams.  These Care Teams include your primary Cardiologist (physician) and Advanced Practice Providers (APPs -  Physician Assistants and Nurse Practitioners) who all work together to provide you with the care you need, when you need it.  Your next appointment:   Your physician wants you to follow-up in: one year with Dr. Lambert.   You will receive a reminder letter in the mail two months in advance. If you don't receive a letter, please call our office to schedule the follow-up appointment.    

## 2020-11-26 NOTE — Progress Notes (Signed)
Electrophysiology Office Follow up Visit Note:    Date:  11/26/2020   ID:  Jacqueline Deleon, DOB Dec 28, 1960, MRN [id number]  PCP:  Kandyce Rud, MD  [facility] Cardiologist:  None  [facility] Electrophysiologist:  Lanier Prude, MD    Interval History:    Jacqueline Deleon is a 60 y.o. female who presents for a follow up visit.  I last saw the patient August 27, 2020 after an isolated episode of atrial flutter.  She has metastatic breast cancer and is currently receiving chemotherapy that causes thrombocytopenia.  For this reason we have held off on anticoagulation.  She also has Elita Boone cirrhosis and diabetes.  Today she tells me she is doing well.  She is had no recurrent episodes of sustained arrhythmia.  She is currently receiving treatment for breast cancer.  She is still working.    Past Medical History:  Diagnosis Date  . Allergic genetic state   . Arthritis   . BRCA negative 2004  . Breast cancer ([facility])    2009 infiltrating ductal cancer of right breast  . Breast mass 08/2006, 03/2009   left breast biopsy fibroadenoma (2008) right breast biopsy benign (2010)  . 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 Central serous retinopathy   . 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 Chicken pox   . Depression   . Fatty liver   . Fatty liver   . Gastric polyps   . GERD (gastroesophageal reflux disease)   . Gestational diabetes    prediabetes out side of having baby  . Headache    migraines  . Heart murmur   . History of abnormal mammogram 08/2006, 01/2008, 03/2009  . Hypertension   . IBS (irritable bowel syndrome)   . Joint disorder    hx of left ankle tendonitis, bursitis, heel spur  . 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 Obesity 2018   BMI 45  . Pelvic pain    adhesions and adenomyosis  . Personal history of radiation therapy   . Rosacea     Past Surgical History:  Procedure  Laterality Date  . ABDOMINAL HYSTERECTOMY    . BREAST BIOPSY Left 08/2006   benign fibroadenoma  . BREAST BIOPSY Right 08/11/2020   [place] bx of LN, hydromarker, Invasive mammary carcinoma  . BREAST EXCISIONAL BIOPSY Right 02/26/2008   right breast invasive mam ca with rad partial mastectomy   . BREAST SURGERY Right    x2/ Dr Katrinka Blazing  . CARDIAC CATHETERIZATION  2006  . CESAREAN SECTION  04/02/1998  . CHOLECYSTECTOMY  04/2010   Dr. Smith/ laprascopic surgery  . COLONOSCOPY  04/04/2000  . COLONOSCOPY WITH PROPOFOL N/A 02/12/2019   Procedure: COLONOSCOPY WITH PROPOFOL;  Surgeon: Christena Deem, MD;  Location[facility] ENDOSCOPY;  Service: Endoscopy;  Laterality: N/A;  . ESOPHAGOGASTRODUODENOSCOPY (EGD) WITH PROPOFOL N/A 05/01/2015   Procedure: ESOPHAGOGASTRODUODENOSCOPY (EGD) WITH PROPOFOL;  Surgeon: Wallace Cullens, MD;  Location: [facility] ENDOSCOPY;  Service: Gastroenterology;  Laterality: N/A;  . ESOPHAGOGASTRODUODENOSCOPY (EGD) WITH PROPOFOL N/A 02/12/2019   Procedure: ESOPHAGOGASTRODUODENOSCOPY (EGD) WITH PROPOFOL;  Surgeon: Christena Deem, MD;  Location: [facility] ENDOSCOPY;  Service: Endoscopy;  Laterality: N/A;  . EYE SURGERY  08/2012   laser surgery on retina/ DR Appenzeller  . FINGER SURGERY     repair of tendon in right index, Dr. Benay Pillow in [place]  . FOOT SURGERY  Dr. Milinda Pointer  . IRRIGATION AND DEBRIDEMENT SEBACEOUS CYST     right upper back  . LAPAROSCOPIC SUPRACERVICAL HYSTERECTOMY  06/2007   Dr Kincius/ adhesions/CPP/adenomyosis  . MASTECTOMY PARTIAL / LUMPECTOMY Right 01/2008   with sentinal lymph node biopsy/ infiltrating ductal cancer  . REPAIR PERONEAL TENDONS ANKLE  10/2019   with tarsal exostectomy and sural neuroma excision on right   . SKIN CANCER EXCISION     back and calves  . WISDOM TOOTH EXTRACTION  1991    Current Medications: Current Meds  Medication Sig  . acetaminophen (TYLENOL) 325 MG tablet Take 650 mg by mouth every 6 (six) hours as needed for mild pain or  headache.  Marland Kitchen buPROPion (WELLBUTRIN XL) 300 MG 24 hr tablet TAKE 1 TABLET BY MOUTH ONCE DAILY  . citalopram (CELEXA) 40 MG tablet Take 40 mg by mouth at bedtime.  . clotrimazole (LOTRIMIN) 1 % cream Apply 1 application topically daily. groin  . clotrimazole-betamethasone (LOTRISONE) cream APPLY EXTERNALLY TWICE A DAY FOR 2 WEEKS  . COVID-19 At Home Antigen Test KIT USE AS DIRECTED WITHIN PACKAGE INSTRUCTIONS (Patient taking differently: USE AS DIRECTED WITHIN PACKAGE INSTRUCTIONS)  . COVID-19 mRNA vaccine, Pfizer, 30 MCG/0.3ML injection USE AS DIRECTED  . diltiazem (CARDIZEM CD) 120 MG 24 hr capsule TAKE 1 CAPSULE BY MOUTH DAILY.  . diphenhydrAMINE (BENADRYL) 25 MG tablet Take 25 mg by mouth at bedtime as needed for sleep.  . fluconazole (DIFLUCAN) 150 MG tablet TAKE 1 TABLET BY MOUTH ONCE FOR 1 DOSE. MAY REPEAT IN 3 DAYS IF STILL HAVING SYMPTOMS  . gabapentin (NEURONTIN) 100 MG capsule Take 1 capsule (100 mg total) by mouth 3 (three) times daily  . ketotifen (ZADITOR) 0.025 % ophthalmic solution Place 1 drop into both eyes daily.  Marland Kitchen letrozole (FEMARA) 2.5 MG tablet TAKE 1 TABLET (2.5 MG TOTAL) BY MOUTH DAILY.  Marland Kitchen lisinopril (ZESTRIL) 10 MG tablet TAKE 1 TABLET BY MOUTH ONCE DAILY  . metFORMIN (GLUCOPHAGE-XR) 500 MG 24 hr tablet Take 500 mg by mouth in the morning and at bedtime.  . metFORMIN (GLUCOPHAGE-XR) 500 MG 24 hr tablet TAKE 1 TABLET BY MOUTH TWICE DAILY WITH MEALS  . Multiple Vitamin (MULTIVITAMIN) tablet Take 1 tablet by mouth daily.  Marland Kitchen omeprazole (PRILOSEC) 20 MG capsule TAKE 1 CAPSULE BY MOUTH 2 TIMES DAILY TAKE 30 MIN BEFORE MEALS.  Marland Kitchen ondansetron (ZOFRAN) 4 MG tablet Take 1 tablet (4 mg total) by mouth every 8 (eight) hours as needed for nausea or vomiting.  Marland Kitchen oxyCODONE (OXY IR/ROXICODONE) 5 MG immediate release tablet TAKE 1 TABLET (5 MG TOTAL) BY MOUTH EVERY 6 (SIX) HOURS AS NEEDED FOR SEVERE PAIN.  . palbociclib (IBRANCE) 100 MG tablet Take 1 tablet (100 mg total) by mouth daily.  Take for 21 days on, 7 days off, repeat every 28 days.     Allergies:   Kiwi extract, Codeine, Grapeseed extract [nutritional supplements], Amoxicillin, Erythromycin, Sulfa antibiotics, and Tape   Social History   Socioeconomic History  . Marital status: Married    Spouse name: Richardson Landry  . Number of children: 1  . Years of education: 65  . Highest education level: Not on file  Occupational History  . Occupation: CMA  Tobacco Use  . Smoking status: Never Smoker  . Smokeless tobacco: Never Used  Vaping Use  . Vaping Use: Never used  Substance and Sexual Activity  . Alcohol use: Yes    Alcohol/week: 0.0 standard drinks    Comment: rare, 1-2 drinks per  year  . Drug use: No  . Sexual activity: Yes    Partners: Male    Birth control/protection: Surgical    Comment: Hysterectomy   Other Topics Concern  . Not on file  Social History Narrative   Lives in [place] with husband, no pets.  Works at [employer].      Diet - regular   Exercise - occasional   Social Determinants of Health   Financial Resource Strain: Not on file  Food Insecurity: Not on file  Transportation Needs: Not on file  Physical Activity: Not on file  Stress: Not on file  Social Connections: Not on file     Family History: The patient's family history includes Atrial fibrillation in her mother; Breast cancer (age of onset: 19) in her cousin; Breast cancer (age of onset: 53) in her cousin; Breast cancer (age of onset: 16) in her maternal aunt; Breast cancer (age of onset: 78) in her mother; Cancer in her maternal aunt and sister; Cancer (age of onset: 33) in her father; Colon cancer in her cousin; Colon cancer (age of onset: 66) in her mother; Diabetes in her maternal aunt and maternal grandmother; Heart disease in her maternal uncle and paternal grandfather; Hip fracture in her mother; Hypertension in her father, mother, and sister; Lung cancer (age of onset: 55) in her paternal aunt; Lymphoma in her maternal uncle;  Parkinson's disease in her father; Skin cancer in her father and mother; Stroke in her father and maternal grandfather; Thyroid cancer in her cousin; Thyroid disease in her mother and sister.  ROS:   Please see the history of present illness.    All other systems reviewed and are negative.  EKGs/Labs/Other Studies Reviewed:    The following studies were reviewed today:   EKG:  The ekg ordered today demonstrates sinus rhythm.  Recent Labs: 08/07/2020: Magnesium 2.0; TSH 3.816 10/24/2020: ALT 21; BUN 20; Creatinine, Ser 0.93; Hemoglobin 13.6; Platelets 188; Potassium 4.3; Sodium 139  Recent Lipid Panel    Component Value Date/Time   CHOL 154 03/06/2020 0811   TRIG 103 03/06/2020 0811   HDL 59 03/06/2020 0811   CHOLHDL 2.8 08/24/2019 0815   LDLCALC 76 03/06/2020 0811    Physical Exam:    VS:  BP 124/82   Pulse 80   Ht $R'5\' 3"'ME$  (1.6 m)   Wt 260 lb (117.9 kg)   BMI 46.06 kg/m     Wt Readings from Last 3 Encounters:  11/26/20 260 lb (117.9 kg)  10/24/20 256 lb 14.4 oz (116.5 kg)  10/23/20 258 lb (117 kg)     GEN:  Well nourished, well developed in no acute distress.  Morbidly obese HEENT: Normal NECK: No JVD; No carotid bruits LYMPHATICS: No lymphadenopathy CARDIAC: RRR, no murmurs, rubs, gallops RESPIRATORY:  Clear to auscultation without rales, wheezing or rhonchi  ABDOMEN: Soft, non-tender, non-distended MUSCULOSKELETAL:  No edema; No deformity  SKIN: Warm and dry NEUROLOGIC:  Alert and oriented x 3 PSYCHIATRIC:  Normal affect   ASSESSMENT:    1. Atrial flutter with rapid ventricular response ([place])   2. 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 Metastatic breast cancer ([place])    PLAN:    In order of problems listed above:  1. Typical atrial flutter with rapid ventricular response 1 isolated episode.  Has not had a recurrence.  In the past we discussed using an anticoagulant for stroke prophylaxis but given her thrombocytopenia and ongoing treatment for  cancer, we  decided to avoid this.  For now, I would continue on the current treatment strategy of no systemic anticoagulation given no recurrent episodes of arrhythmias.  2.  Metastatic breast cancer Ongoing treatment.  Has follow-up scheduled with oncology this week.  Has repeat scans scheduled for tomorrow.  3.  NASH cirrhosis  Follow-up 1 year or sooner as needed.  Medication Adjustments/Labs and Tests Ordered: Current medicines are reviewed at length with the patient today.  Concerns regarding medicines are outlined above.  No orders of the defined types were placed in this encounter.  No orders of the defined types were placed in this encounter.    Signed, Lars Mage, MD, [facility], [facility] 11/26/2020 4:30 PM    Electrophysiology [facility]

## 2020-11-27 ENCOUNTER — Ambulatory Visit
Admission: RE | Admit: 2020-11-27 | Discharge: 2020-11-27 | Disposition: A | Discharge status: Home / Self Care | Payer: 59 | Source: Ambulatory Visit | Attending: Oncology | Admitting: Oncology

## 2020-11-27 ENCOUNTER — Encounter
Admission: RE | Admit: 2020-11-27 | Discharge: 2020-11-27 | Disposition: A | Discharge status: Home / Self Care | Payer: 59 | Source: Ambulatory Visit | Attending: Oncology | Admitting: Oncology

## 2020-11-27 DIAGNOSIS — Z79899 Other long term (current) drug therapy: Secondary | ICD-10-CM | POA: Insufficient documentation

## 2020-11-27 DIAGNOSIS — M47819 Spondylosis without myelopathy or radiculopathy, site unspecified: Secondary | ICD-10-CM | POA: Diagnosis not present

## 2020-11-27 DIAGNOSIS — M79605 Pain in left leg: Secondary | ICD-10-CM | POA: Diagnosis not present

## 2020-11-27 DIAGNOSIS — C7951 Secondary malignant neoplasm of bone: Secondary | ICD-10-CM | POA: Diagnosis not present

## 2020-11-27 DIAGNOSIS — C50919 Malignant neoplasm of unspecified site of unspecified female breast: Secondary | ICD-10-CM | POA: Insufficient documentation

## 2020-11-27 DIAGNOSIS — Z5111 Encounter for antineoplastic chemotherapy: Secondary | ICD-10-CM | POA: Diagnosis not present

## 2020-11-27 DIAGNOSIS — J984 Other disorders of lung: Secondary | ICD-10-CM | POA: Diagnosis not present

## 2020-11-27 MED ORDER — TECHNETIUM TC 99M MEDRONATE IV KIT
20.0000 | PACK | Freq: Once | INTRAVENOUS | Status: AC | PRN
  Administered 2020-11-27: 22.19 via INTRAVENOUS

## 2020-11-28 ENCOUNTER — Encounter: Payer: Self-pay | Admitting: Oncology

## 2020-11-28 ENCOUNTER — Inpatient Hospital Stay: Payer: 59 | Attending: Oncology

## 2020-11-28 ENCOUNTER — Other Ambulatory Visit: Payer: Self-pay

## 2020-11-28 ENCOUNTER — Inpatient Hospital Stay (HOSPITAL_BASED_OUTPATIENT_CLINIC_OR_DEPARTMENT_OTHER): Payer: 59 | Admitting: Oncology

## 2020-11-28 ENCOUNTER — Ambulatory Visit
Admission: RE | Admit: 2020-11-28 | Discharge: 2020-11-28 | Disposition: A | Discharge status: Home / Self Care | Payer: 59 | Source: Ambulatory Visit | Attending: Oncology | Admitting: Oncology

## 2020-11-28 ENCOUNTER — Inpatient Hospital Stay: Payer: 59

## 2020-11-28 ENCOUNTER — Telehealth: Payer: Self-pay | Admitting: Oncology

## 2020-11-28 VITALS — BP 139/81 | HR 73 | Temp 98.8°F | Wt 259.6 lb

## 2020-11-28 DIAGNOSIS — C50919 Malignant neoplasm of unspecified site of unspecified female breast: Secondary | ICD-10-CM | POA: Diagnosis not present

## 2020-11-28 DIAGNOSIS — I82402 Acute embolism and thrombosis of unspecified deep veins of left lower extremity: Secondary | ICD-10-CM | POA: Insufficient documentation

## 2020-11-28 DIAGNOSIS — Z79899 Other long term (current) drug therapy: Secondary | ICD-10-CM | POA: Diagnosis not present

## 2020-11-28 DIAGNOSIS — C50911 Malignant neoplasm of unspecified site of right female breast: Secondary | ICD-10-CM | POA: Insufficient documentation

## 2020-11-28 DIAGNOSIS — Z853 Personal history of malignant neoplasm of breast: Secondary | ICD-10-CM | POA: Diagnosis not present

## 2020-11-28 DIAGNOSIS — R600 Localized edema: Secondary | ICD-10-CM | POA: Diagnosis not present

## 2020-11-28 DIAGNOSIS — C7951 Secondary malignant neoplasm of bone: Secondary | ICD-10-CM | POA: Diagnosis not present

## 2020-11-28 DIAGNOSIS — Z5181 Encounter for therapeutic drug level monitoring: Secondary | ICD-10-CM

## 2020-11-28 DIAGNOSIS — M79662 Pain in left lower leg: Secondary | ICD-10-CM | POA: Diagnosis not present

## 2020-11-28 DIAGNOSIS — C773 Secondary and unspecified malignant neoplasm of axilla and upper limb lymph nodes: Secondary | ICD-10-CM | POA: Insufficient documentation

## 2020-11-28 DIAGNOSIS — Z170 Estrogen receptor positive status [ER+]: Secondary | ICD-10-CM | POA: Insufficient documentation

## 2020-11-28 DIAGNOSIS — Z79811 Long term (current) use of aromatase inhibitors: Secondary | ICD-10-CM

## 2020-11-28 LAB — CBC WITH DIFFERENTIAL/PLATELET
Abs Immature Granulocytes: 0.01 10*3/uL (ref 0.00–0.07)
Basophils Absolute: 0 10*3/uL (ref 0.0–0.1)
Basophils Relative: 1 %
Eosinophils Absolute: 0.1 10*3/uL (ref 0.0–0.5)
Eosinophils Relative: 2 %
HCT: 36.6 % (ref 36.0–46.0)
Hemoglobin: 12 g/dL (ref 12.0–15.0)
Immature Granulocytes: 0 %
Lymphocytes Relative: 31 %
Lymphs Abs: 0.9 10*3/uL (ref 0.7–4.0)
MCH: 29.9 pg (ref 26.0–34.0)
MCHC: 32.8 g/dL (ref 30.0–36.0)
MCV: 91 fL (ref 80.0–100.0)
Monocytes Absolute: 0.5 10*3/uL (ref 0.1–1.0)
Monocytes Relative: 17 %
Neutro Abs: 1.3 10*3/uL — ABNORMAL LOW (ref 1.7–7.7)
Neutrophils Relative %: 49 %
Platelets: 171 10*3/uL (ref 150–400)
RBC: 4.02 MIL/uL (ref 3.87–5.11)
RDW: 25.8 % — ABNORMAL HIGH (ref 11.5–15.5)
WBC: 2.8 10*3/uL — ABNORMAL LOW (ref 4.0–10.5)
nRBC: 0 % (ref 0.0–0.2)

## 2020-11-28 LAB — COMPREHENSIVE METABOLIC PANEL
ALT: 18 U/L (ref 0–44)
AST: 17 U/L (ref 15–41)
Albumin: 4.1 g/dL (ref 3.5–5.0)
Alkaline Phosphatase: 82 U/L (ref 38–126)
Anion gap: 10 (ref 5–15)
BUN: 14 mg/dL (ref 6–20)
CO2: 22 mmol/L (ref 22–32)
Calcium: 9 mg/dL (ref 8.9–10.3)
Chloride: 106 mmol/L (ref 98–111)
Creatinine, Ser: 0.68 mg/dL (ref 0.44–1.00)
GFR, Estimated: >60 mL/min (ref >60)
Glucose, Bld: 109 mg/dL — ABNORMAL HIGH (ref 70–99)
Potassium: 4.3 mmol/L (ref 3.5–5.1)
Sodium: 138 mmol/L (ref 135–145)
Total Bilirubin: 0.5 mg/dL (ref 0.3–1.2)
Total Protein: 7.2 g/dL (ref 6.5–8.1)

## 2020-11-28 MED ORDER — SODIUM CHLORIDE 0.9 % IV SOLN
Freq: Once | INTRAVENOUS | Status: AC
  Filled 2020-11-28: qty 250

## 2020-11-28 MED ORDER — ZOLEDRONIC ACID 4 MG/100ML IV SOLN
4.0000 mg | INTRAVENOUS | Status: DC
  Administered 2020-11-28: 4 mg via INTRAVENOUS
  Filled 2020-11-28: qty 100

## 2020-11-28 NOTE — Progress Notes (Signed)
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 It was a 1.4 cm tumor with negative lymph nodes grade 1 and patient went on to get lumpectomy followed by adjuvant radiation therapy. Oncotype DX score was 6 and she did not require adjuvant chemotherapy. She was on tamoxifen for 5 years. She had BRCA testing done at that time which was negative.  Patient felt a lump in her right axilla in September 2021. She underwent ultrasound of bilateral breasts which did not pick up any axillary mass. A prior screening mammogram in September 2021 was unremarkable. She was then admitted for Covid pneumonia and underwent CT angio chest on 07/22/2020 which incidentally picked up an axillary mass measuring 2.5 cm. No obvious breast mass. Axillary mass was biopsied and was consistent with high-grade invasive mammary carcinoma. IHC was positive for GATA3 with diffuse strong nuclear staining. There was no lymph node architecture identified in the sample and it could not be determined if it is a primary tumor within the axillary breast tissue or metastases.ER strongly positive greater than 90%, PR 1 to 10% positive and HER-2 negative  PET CT scan showed hypermetabolic poorly marginated solid right axillary mass 2.5 x 2 cm with an SUV of 5.6. No enlarged hypermetabolic  mediastinal or hilar lymph nodes no enlarged left axillary lymph nodes. Subcentimeter lung nodules below PET resolution. Small hypermetabolism in the right liver with an SUV of 6 without CT correlate equivocal for liver metastases. Multiple hypermetabolic faintly lytic osseous lesions throughout the lumbar spine and bilateral pelvic girdle with representative supra-acetabular right iliac bone 1.9 cm lesion, anterior superior left acetabular 1.7 cm lesion and L1 1.2 cm lesion.  NGS testing showed no actionable mutations. PD-L1 less than 1%. CHEK2 S422 FS, K3 CA and 1068 FS, PICC 3 CA and 345H, CCN E1 gain, ERB B21 P-CSF 1 hour fusion, FGF 19 gain, FGF 3 gain, FGF 4gain Ibrance plus letrozole started in February 2022.  Baseline tumor markers not elevated  Interval history-fatigue is improved after reducing Ibrance dosing to 154m.  Low back pain is better.  She still has some pain in her left lower extremity intermittently.  She was on gabapentin in the past but has not taken that recently.  Also reports waxing and waning swelling in her left lower extremity.  It had improved after radiation to her left hip but feels like it is coming back.  ECOG PS- 1 Pain scale- 0   Review of systems- Review of Systems  Constitutional: Positive for malaise/fatigue. Negative for chills, fever and weight loss.  HENT: Negative for congestion, ear discharge and nosebleeds.   Eyes: Negative for blurred vision.  Respiratory: Negative for cough, hemoptysis, sputum production, shortness of breath and wheezing.   Cardiovascular: Negative for chest pain, palpitations, orthopnea and claudication.  Gastrointestinal: Negative for abdominal pain, blood in stool, constipation, diarrhea, heartburn, melena, nausea and vomiting.  Genitourinary: Negative for dysuria,  flank pain, frequency, hematuria and urgency.  Musculoskeletal: Negative for back pain, joint pain and myalgias.       Left lower extremity pain  Skin: Negative  for rash.  Neurological: Negative for dizziness, tingling, focal weakness, seizures, weakness and headaches.  Endo/Heme/Allergies: Does not bruise/bleed easily.  Psychiatric/Behavioral: Negative for depression and suicidal ideas. The patient does not have insomnia.        Allergies  Allergen Reactions  . Kiwi Extract Itching and Swelling    The real kiwi fruit  . Codeine Other (See Comments)    Felt funny.  Lorenza Chick Extract [Nutritional Supplements] Itching and Swelling    Raw Grapes only if cooked it is ok  . Amoxicillin Rash  . Erythromycin Rash  . Sulfa Antibiotics Rash  . Tape Rash    Adhesives  Pt can have paper tape     Past Medical History:  Diagnosis Date  . Allergic genetic state   . Arthritis   . BRCA negative 2004  . Breast cancer ([place])    2009 infiltrating ductal cancer of right breast  . Breast mass 08/2006, 03/2009   left breast biopsy fibroadenoma (2008) right breast biopsy benign (2010)  . 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 Central serous retinopathy   . CHEK2-related breast cancer ([place]) 07/2020  . Chicken pox   . Depression   . Fatty liver   . Fatty liver   . Gastric polyps   . GERD (gastroesophageal reflux disease)   . Gestational diabetes    prediabetes out side of having baby  . Headache    migraines  . Heart murmur   . History of abnormal mammogram 08/2006, 01/2008, 03/2009  . Hypertension   . IBS (irritable bowel syndrome)   . Joint disorder    hx of left ankle tendonitis, bursitis, heel spur  . 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 Obesity 2018   BMI 45  . Pelvic pain    adhesions and adenomyosis  . Personal history of radiation therapy   . Rosacea      Past Surgical History:  Procedure Laterality Date  . ABDOMINAL HYSTERECTOMY    . BREAST BIOPSY Left 08/2006   benign fibroadenoma  . BREAST BIOPSY Right 08/11/2020   [place] bx of LN, hydromarker,  Invasive mammary carcinoma  . BREAST EXCISIONAL BIOPSY Right 02/26/2008   right breast invasive mam ca with rad partial mastectomy   . BREAST SURGERY Right    x2/ Dr Tamala Julian  . CARDIAC CATHETERIZATION  2006  . CESAREAN SECTION  04/02/1998  . CHOLECYSTECTOMY  04/2010   Dr. Smith/ laprascopic surgery  . COLONOSCOPY  04/04/2000  . COLONOSCOPY WITH PROPOFOL N/A 02/12/2019   Procedure: COLONOSCOPY WITH PROPOFOL;  Surgeon: Lollie Sails, MD;  Location: [facility] ENDOSCOPY;  Service: Endoscopy;  Laterality: N/A;  . ESOPHAGOGASTRODUODENOSCOPY (EGD) WITH PROPOFOL N/A 05/01/2015   Procedure: ESOPHAGOGASTRODUODENOSCOPY (EGD) WITH PROPOFOL;  Surgeon: Hulen Luster, MD;  Location: [facility] ENDOSCOPY;  Service: Gastroenterology;  Laterality: N/A;  . ESOPHAGOGASTRODUODENOSCOPY (EGD) WITH PROPOFOL N/A 02/12/2019   Procedure: ESOPHAGOGASTRODUODENOSCOPY (EGD) WITH PROPOFOL;  Surgeon: Lollie Sails, MD;  Location: [facility] ENDOSCOPY;  Service: Endoscopy;  Laterality: N/A;  . EYE SURGERY  08/2012   laser surgery on retina/ DR Appenzeller  . FINGER SURGERY     repair of tendon in right index, Dr. Francesco Sor in [place]  . FOOT SURGERY  Dr. Milinda Pointer  . IRRIGATION AND DEBRIDEMENT SEBACEOUS CYST     right upper back  . LAPAROSCOPIC SUPRACERVICAL HYSTERECTOMY  06/2007   Dr Kincius/ adhesions/CPP/adenomyosis  . MASTECTOMY PARTIAL / LUMPECTOMY Right 01/2008   with sentinal lymph node biopsy/ infiltrating ductal cancer  . REPAIR PERONEAL TENDONS ANKLE  10/2019   with tarsal exostectomy and sural neuroma excision on right   . SKIN CANCER EXCISION     back and calves  . WISDOM TOOTH EXTRACTION  1991    Social History   Socioeconomic History  . Marital status: Married    Spouse name: Richardson Landry  . Number of children: 1  . Years of education: 2  . Highest education level: Not on file  Occupational History  . Occupation: CMA  Tobacco Use  . Smoking status: Never Smoker  . Smokeless tobacco: Never Used  Vaping Use  .  Vaping Use: Never used  Substance and Sexual Activity  . Alcohol use: Yes    Alcohol/week: 0.0 standard drinks    Comment: rare, 1-2 drinks per year  . Drug use: No  . Sexual activity: Yes    Partners: Male    Birth control/protection: Surgical    Comment: Hysterectomy   Other Topics Concern  . Not on file  Social History Narrative   Lives in [place] with husband, no pets.  Works at [employer].      Diet - regular   Exercise - occasional   Social Determinants of Health   Financial Resource Strain: Not on file  Food Insecurity: Not on file  Transportation Needs: Not on file  Physical Activity: Not on file  Stress: Not on file  Social Connections: Not on file  Intimate Partner Violence: Not on file    Family History  Problem Relation Age of Onset  . Hypertension Mother   . Thyroid disease Mother   . Breast cancer Mother 50  . Colon cancer Mother 74       again at 46  . Atrial fibrillation Mother   . Hip fracture Mother   . Skin cancer Mother   . Stroke Father   . Hypertension Father   . Cancer Father 41       prostate  . Parkinson's disease Father   . Skin cancer Father   . Hypertension Sister   . Thyroid disease Sister   . Cancer Sister        skin/ squamous cell  . Lung cancer Paternal Aunt 80       smoker  . Diabetes Maternal Grandmother   . Stroke Maternal Grandfather   . Heart disease Paternal Grandfather   . Diabetes Maternal Aunt   . Lymphoma Maternal Uncle   . Thyroid cancer Cousin        maternal  . Heart disease Maternal Uncle   . Cancer Maternal Aunt   . Breast cancer Maternal Aunt 75  . Breast cancer Cousin 60       maternal  . Breast cancer Cousin 110  . Colon cancer Cousin      Current Outpatient Medications:  .  acetaminophen (TYLENOL) 325 MG tablet, Take 650 mg by mouth every 6 (six) hours as needed for mild pain or headache., Disp: , Rfl:  .  buPROPion (WELLBUTRIN XL) 300 MG 24 hr tablet, TAKE 1 TABLET BY MOUTH ONCE DAILY, Disp: 90  tablet, Rfl: 1 .  citalopram (CELEXA) 40 MG tablet, Take 40 mg by mouth at bedtime., Disp: , Rfl:  .  diltiazem (CARDIZEM CD) 120 MG 24 hr capsule, TAKE 1 CAPSULE BY MOUTH DAILY., Disp: 30 capsule, Rfl: 10 .  diphenhydrAMINE (BENADRYL) 25 MG tablet, Take 25 mg by mouth at bedtime as needed for sleep., Disp: , Rfl:  .  ketotifen (ZADITOR) 0.025 % ophthalmic solution, Place 1 drop into both eyes daily., Disp: , Rfl:  .  letrozole (FEMARA) 2.5 MG tablet, TAKE 1 TABLET (2.5 MG TOTAL) BY MOUTH DAILY., Disp: 90 tablet, Rfl: 4 .  lisinopril (ZESTRIL) 10 MG tablet, TAKE 1 TABLET BY MOUTH ONCE DAILY, Disp: 90 tablet, Rfl: 1 .  metFORMIN (GLUCOPHAGE-XR) 500 MG 24 hr tablet, TAKE 1 TABLET BY MOUTH TWICE DAILY WITH MEALS, Disp: 180 tablet, Rfl: 1 .  Multiple Vitamin (MULTIVITAMIN) tablet, Take 1 tablet by mouth daily., Disp: , Rfl:  .  omeprazole (PRILOSEC) 20 MG capsule, TAKE 1 CAPSULE BY MOUTH 2 TIMES DAILY TAKE 30 MIN BEFORE MEALS., Disp: 180 capsule, Rfl: 2 .  oxyCODONE (OXY IR/ROXICODONE) 5 MG immediate release tablet, TAKE 1 TABLET (5 MG TOTAL) BY MOUTH EVERY 6 (SIX) HOURS AS NEEDED FOR SEVERE PAIN., Disp: 60 tablet, Rfl: 0 .  palbociclib (IBRANCE) 100 MG tablet, Take 1 tablet (100 mg total) by mouth daily. Take for 21 days on, 7 days off, repeat every 28 days., Disp: 21 tablet, Rfl: 2 .  clotrimazole (LOTRIMIN) 1 % cream, Apply 1 application topically daily. groin (Patient not taking: Reported on 11/28/2020), Disp: , Rfl:  .  clotrimazole-betamethasone (LOTRISONE) cream, APPLY EXTERNALLY TWICE A DAY FOR 2 WEEKS (Patient not taking: Reported on 11/28/2020), Disp: 45 g, Rfl: 0 .  COVID-19 At Home Antigen Test KIT, USE AS DIRECTED WITHIN PACKAGE INSTRUCTIONS (Patient not taking: Reported on 11/28/2020), Disp: 4 kit, Rfl: 0 .  COVID-19 mRNA vaccine, Pfizer, 30 MCG/0.3ML injection, USE AS DIRECTED, Disp: .3 mL, Rfl: 0 .  gabapentin (NEURONTIN) 100 MG capsule, Take 1 capsule (100 mg total) by mouth 3 (three) times  daily (Patient not taking: Reported on 11/28/2020), Disp: 270 capsule, Rfl: 1 .  ondansetron (ZOFRAN) 4 MG tablet, Take 1 tablet (4 mg total) by mouth every 8 (eight) hours as needed for nausea or vomiting. (Patient not taking: Reported on 11/28/2020), Disp: 30 tablet, Rfl: 0  Physical exam:  Vitals:   11/28/20 1022  BP: 139/81  Pulse: 73  Temp: 98.8 F (37.1 C)  TempSrc: Tympanic  SpO2: 100%  Weight: 259 lb 9.6 oz (117.8 kg)   Physical Exam Constitutional:      General: She is not in acute distress. Cardiovascular:     Rate and Rhythm: Normal rate.     Heart sounds: Normal heart sounds.  Pulmonary:     Effort: Pulmonary effort is normal.  Skin:    General: Skin is warm and dry.  Neurological:     Mental Status: She is alert and oriented to person, place, and time.      CMP Latest Ref Rng & Units 11/28/2020  Glucose 70 - 99 mg/dL 109(H)  BUN 6 - 20 mg/dL 14  Creatinine 0.44 - 1.00 mg/dL 0.68  Sodium 135 - 145 mmol/L 138  Potassium 3.5 - 5.1 mmol/L 4.3  Chloride 98 - 111 mmol/L 106  CO2 22 - 32 mmol/L 22  Calcium 8.9 - 10.3 mg/dL 9.0  Total Protein 6.5 - 8.1 g/dL 7.2  Total Bilirubin 0.3 - 1.2 mg/dL 0.5  Alkaline Phos 38 - 126 U/L 82  AST 15 - 41 U/L 17  ALT 0 - 44  U/L 18   CBC Latest Ref Rng & Units 11/28/2020  WBC 4.0 - 10.5 K/uL 2.8(L)  Hemoglobin 12.0 - 15.0 g/dL 12.0  Hematocrit 36.0 - 46.0 % 36.6  Platelets 150 - 400 K/uL 171    No images are attached to the encounter.  CT CHEST ABDOMEN PELVIS WO CONTRAST  Result Date: 11/28/2020 CLINICAL DATA:  Metastatic breast cancer, restaging. Post radiation therapy and chemotherapy. EXAM: CT CHEST, ABDOMEN AND PELVIS WITHOUT CONTRAST TECHNIQUE: Multidetector CT imaging of the chest, abdomen and pelvis was performed following the standard protocol without IV contrast. COMPARISON:  PET 08/18/2020 and CT chest 07/22/2020. FINDINGS: CT CHEST FINDINGS Cardiovascular: Heart size normal.  No pericardial effusion.  Mediastinum/Nodes: No pathologically enlarged mediastinal, hilar, internal mammary or axillary lymph nodes. Largest right axillary lymph node now measures 9 mm, compared to 2.5 cm previously. Esophagus is grossly unremarkable. Lungs/Pleura: Mild scarring in the right middle lobe and lingula. 4 mm subpleural nodule along right major fissure (4/67), stable. Previously seen subpleural nodule in the lateral right upper lobe is no longer visualized. 2 mm nodules in the left lower lobe are unchanged. No new pulmonary nodules. No pleural fluid. Airway is unremarkable. No pleural fluid. Musculoskeletal: There are new areas of sclerosis in the thoracic spine, involving the T6 and T8 vertebral bodies. CT ABDOMEN PELVIS FINDINGS Hepatobiliary: Liver is unremarkable. Cholecystectomy. No biliary ductal dilatation. Pancreas: Negative. Spleen: Negative. Adrenals/Urinary Tract: Adrenal glands and right kidney are unremarkable. 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 Ureters are decompressed. Bladder is grossly unremarkable. Stomach/Bowel: Stomach, small bowel and appendix are unremarkable. Fair amount of stool is seen in the colon, indicative of constipation. Vascular/Lymphatic: Vascular structures are unremarkable. No pathologically enlarged lymph nodes. Reproductive: Uterus is visualized.  No adnexal mass. Other: No free fluid. Mesenteries and peritoneum are unremarkable. Degenerative changes in the spine. Sclerosis in the Musculoskeletal: Degenerative changes in the spine. Sclerosis is seen in the L1 and L5 vertebral bodies as well as mixed lytic and sclerotic lesions in the superior acetabular regions bilaterally. IMPRESSION: 1. Interval reduction in size of right axillary adenopathy. 2. Sclerosis involving metastatic lesions in the spine and pelvis, indicative of healing. 3. Subpleural nodule in the lateral right upper lobe has resolved in the interval. Other tiny pulmonary  nodules are stable. Electronically Signed   By: Lorin Picket M.D.   On: 11/28/2020 11:11     Assessment and plan- Patient is a 60 y.o. female with metastatic ER positive breast cancer on first-line letrozole plus Ibrance and this is a routine follow-up visit  Results of CT chest abdomen and pelvis as well as bone scan were not back at the time of my visit today but later came backWith continued response to treatment and reduction in the size of the right axillary lymph node from 2.5 cm to 9 mm.  Subcentimeter lung nodules stable and unclear if they are truly related to malignancy.  Metastatic lesions in the spine and pelvis evidence of sclerosis or indicative of healing.  No concern for progressive disease.  Plan is to continue letrozole plus Ibrance until progression or toxicity.  She started her next cycle of Ibrance today and will continue it for 3 weeks on and 1 week off.  Patient will receive Zometa today.  Labs and Zometa in 1 month and 2 months and I will see her back in 2 months  Left lower extremity pain: Have asked her to restart gabapentin 100  mg 3 times daily  Left lower extremity swelling: Her left leg appears a little more swollen than the right.  I will obtain a ultrasound to rule out DVT   Visit Diagnosis 1. Metastatic breast cancer ([place])   2. Acute deep vein thrombosis (DVT) of left lower extremity, unspecified vein ([facility])   3. High risk medication use   4. Use of letrozole ([place])      Dr. Randa Evens, MD, MPH [facility] at [facility] [phone] 11/28/2020 1:43 PM

## 2020-11-29 LAB — CANCER ANTIGEN 27.29: CA 27.29: 26.1 U/mL (ref 0.0–38.6)

## 2020-12-09 ENCOUNTER — Other Ambulatory Visit (HOSPITAL_COMMUNITY): Payer: Self-pay

## 2020-12-11 ENCOUNTER — Other Ambulatory Visit (HOSPITAL_COMMUNITY): Payer: Self-pay

## 2020-12-16 ENCOUNTER — Other Ambulatory Visit (HOSPITAL_COMMUNITY): Payer: Self-pay

## 2020-12-21 ENCOUNTER — Ambulatory Visit
Admission: EM | Admit: 2020-12-21 | Discharge: 2020-12-21 | Disposition: A | Discharge status: Home / Self Care | Payer: 59 | Attending: Physician Assistant | Admitting: Physician Assistant

## 2020-12-21 ENCOUNTER — Encounter: Payer: Self-pay | Admitting: Emergency Medicine

## 2020-12-21 ENCOUNTER — Other Ambulatory Visit: Payer: Self-pay

## 2020-12-21 DIAGNOSIS — M7051 Other bursitis of knee, right knee: Secondary | ICD-10-CM

## 2020-12-21 DIAGNOSIS — M25561 Pain in right knee: Secondary | ICD-10-CM | POA: Diagnosis not present

## 2020-12-21 NOTE — Discharge Instructions (Addendum)
KNEE PAIN: Rest the knee, ice it every couple of hours for the pain as.  Try to keep elevated.  Keep a pillow under the knees at night.  You can take low-dose Advil to help with inflammation and also he can take Tylenol.  Take your home oxycodone if absolutely needed for pain.  Also, you would benefit from wearing a knee brace provided today.  This may take a week or 2 to improve but if it does not or if it worsens you should follow-up with orthopedics.  You may have a condition requiring you to follow up with Orthopedics so please call one of the following office for appointment:   [facility] [address] [place], [place] [address] Phone: [phone]  [facility] [address], [place], [place] [address] Phone: [phone]

## 2020-12-21 NOTE — ED Triage Notes (Signed)
Patient c/o right knee pain that started on Wed.  Patient denies injury or fall.

## 2020-12-21 NOTE — ED Provider Notes (Signed)
[facility]    CSN: [id number] Arrival date & time: 12/21/20  0803      History   Chief Complaint Chief Complaint  Patient presents with   Knee Pain    right    HPI Jacqueline Deleon is a 60 y.o. female presenting for 4-day history of right-sided knee pain and mild swelling.  Patient says most the pain is of the lateral knee.  Patient denies any injury but says the pain started after she was done on her knees cleaning her bathtub.  Patient says she is unsure if she twisted her knee possibly.  She denies any trauma to the knee.  Patient denies any numbness, weakness or tingling.  Pain is little better when she sitting a little worse when she is standing and walking.  She does have a seated job.  Patient says that she has not really had problems with her knees before.  She has taken Advil at home and and oxycodone at bedtime.  Patient receives oxycodone for chronic pain related to breast cancer.  Patient has no other complaints.  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   Chicken pox    Depression    Fatty liver    Fatty liver    Gastric polyps    GERD (gastroesophageal reflux disease)    Gestational diabetes    prediabetes out side of having baby   Headache    migraines   Heart murmur    History of abnormal mammogram 08/2006, 01/2008, 03/2009   Hypertension    IBS (irritable bowel syndrome)    Joint disorder    hx of left ankle tendonitis, bursitis, heel spur   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   Obesity 2018   BMI 45   Pelvic pain     adhesions and adenomyosis   Personal history of radiation therapy    Rosacea     Patient Active Problem List   Diagnosis Date Noted   Metastatic breast cancer ([place]) 08/23/2020   Goals of care, counseling/discussion 08/23/2020   Atrial flutter with rapid ventricular response ([place]) 08/07/2020   COVID-19 virus infection 08/07/2020   Depression 08/07/2020   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   OSA on CPAP 06/30/7587   Non-alcoholic fatty liver disease 03/06/2017   Rosacea    GERD (gastroesophageal reflux disease)    Central serous retinopathy    IBS (irritable bowel syndrome)    Hyperlipidemia, mixed 01/24/2017   Dyspnea on effort 12/22/2016   Anxiety, generalized 04/29/2016   Diabetes mellitus without complication ([place]) 32/54/9826   Cancer of the skin, basal cell 03/12/2016   Contact dermatitis 04/23/2013   Nodule, subcutaneous 07/24/2012   Localized swelling, mass and lump, unspecified 07/24/2012   Hypertension 12/20/2011   Personal history of breast cancer 12/20/2011   Obesity 12/20/2011    Past Surgical History:  Procedure Laterality Date   ABDOMINAL HYSTERECTOMY     BREAST BIOPSY Left 08/2006   benign fibroadenoma  BREAST BIOPSY Right 08/11/2020   [place] bx of LN, hydromarker, Invasive mammary carcinoma   BREAST EXCISIONAL BIOPSY Right 02/26/2008   right breast invasive mam ca with rad partial mastectomy    BREAST SURGERY Right    x2/ Dr Tamala Julian   CARDIAC CATHETERIZATION  2006   CESAREAN SECTION  04/02/1998   CHOLECYSTECTOMY  04/2010   Dr. Smith/ laprascopic surgery   COLONOSCOPY  04/04/2000   COLONOSCOPY WITH PROPOFOL N/A 02/12/2019   Procedure: COLONOSCOPY WITH PROPOFOL;  Surgeon: Lollie Sails, MD;  Location: [facility] ENDOSCOPY;  Service: Endoscopy;  Laterality: N/A;   ESOPHAGOGASTRODUODENOSCOPY (EGD) WITH PROPOFOL N/A 05/01/2015   Procedure: ESOPHAGOGASTRODUODENOSCOPY (EGD) WITH PROPOFOL;  Surgeon: Hulen Luster, MD;  Location[facility] ENDOSCOPY;   Service: Gastroenterology;  Laterality: N/A;   ESOPHAGOGASTRODUODENOSCOPY (EGD) WITH PROPOFOL N/A 02/12/2019   Procedure: ESOPHAGOGASTRODUODENOSCOPY (EGD) WITH PROPOFOL;  Surgeon: Lollie Sails, MD;  Location: [facility] ENDOSCOPY;  Service: Endoscopy;  Laterality: N/A;   EYE SURGERY  08/2012   laser surgery on retina/ DR Appenzeller   FINGER SURGERY     repair of tendon in right index, Dr. Francesco Sor in [facility]   FOOT SURGERY     Dr. Milinda Pointer   IRRIGATION AND DEBRIDEMENT SEBACEOUS CYST     right upper back   LAPAROSCOPIC SUPRACERVICAL HYSTERECTOMY  06/2007   Dr Kincius/ adhesions/CPP/adenomyosis   MASTECTOMY PARTIAL / LUMPECTOMY Right 01/2008   with sentinal lymph node biopsy/ infiltrating ductal cancer   REPAIR PERONEAL TENDONS ANKLE  10/2019   with tarsal exostectomy and sural neuroma excision on right    SKIN CANCER EXCISION     back and calves   WISDOM TOOTH EXTRACTION  1991    OB History     Gravida  1   Para  1   Term      Preterm  1   AB      Living  1      SAB      IAB      Ectopic      Multiple      Live Births  1            Home Medications    Prior to Admission medications   Medication Sig Start Date End Date Taking? Authorizing Provider  buPROPion (WELLBUTRIN XL) 300 MG 24 hr tablet TAKE 1 TABLET BY MOUTH ONCE DAILY 06/23/20 06/23/21 Yes Derinda Late, MD  citalopram (CELEXA) 40 MG tablet Take 40 mg by mouth at bedtime. 04/10/20  Yes [provider]  diltiazem (CARDIZEM CD) 120 MG 24 hr capsule TAKE 1 CAPSULE BY MOUTH DAILY. 08/08/20 08/08/21 Yes Hosie Poisson, MD  gabapentin (NEURONTIN) 100 MG capsule Take 1 capsule (100 mg total) by mouth 3 (three) times daily 10/14/20  Yes   letrozole (FEMARA) 2.5 MG tablet TAKE 1 TABLET (2.5 MG TOTAL) BY MOUTH DAILY. 08/21/20 08/21/21 Yes Sindy Guadeloupe, MD  lisinopril (ZESTRIL) 10 MG tablet TAKE 1 TABLET BY MOUTH ONCE DAILY 09/08/20 09/08/21 Yes Derinda Late, MD  metFORMIN (GLUCOPHAGE-XR) 500 MG 24 hr  tablet TAKE 1 TABLET BY MOUTH TWICE DAILY WITH MEALS 09/08/20 09/08/21 Yes Derinda Late, MD  Multiple Vitamin (MULTIVITAMIN) tablet Take 1 tablet by mouth daily.   Yes [provider]  omeprazole (PRILOSEC) 20 MG capsule TAKE 1 CAPSULE BY MOUTH 2 TIMES DAILY TAKE 30 MIN BEFORE MEALS. 04/22/20 04/22/21 Yes London, Stana Bunting, NP  oxyCODONE (OXY IR/ROXICODONE) 5 MG immediate release tablet TAKE 1 TABLET (5 MG TOTAL) BY  MOUTH EVERY 6 (SIX) HOURS AS NEEDED FOR SEVERE PAIN. 08/22/20 02/18/21 Yes Sindy Guadeloupe, MD  palbociclib [facility]) 100 MG tablet Take 1 tablet (100 mg total) by mouth daily. Take for 21 days on, 7 days off, repeat every 28 days. 10/17/20  Yes Sindy Guadeloupe, MD  acetaminophen (TYLENOL) 325 MG tablet Take 650 mg by mouth every 6 (six) hours as needed for mild pain or headache.    [provider]  clotrimazole (LOTRIMIN) 1 % cream Apply 1 application topically daily. groin Patient not taking: No sig reported    [provider]  clotrimazole-betamethasone (LOTRISONE) cream APPLY EXTERNALLY TWICE A DAY FOR 2 WEEKS Patient not taking: No sig reported 10/06/20 1/61/09  Copland, Deirdre Evener, PA-C  UEAVW-09 At Home Antigen Test KIT USE AS DIRECTED WITHIN PACKAGE INSTRUCTIONS Patient not taking: Reported on 11/28/2020 10/06/20 10/06/21  Letta Median, RPH  COVID-19 mRNA vaccine, Pfizer, 30 MCG/0.3ML injection USE AS DIRECTED 05/23/20 05/23/21  Carlyle Basques, MD  diphenhydrAMINE (BENADRYL) 25 MG tablet Take 25 mg by mouth at bedtime as needed for sleep.    [provider]  ketotifen (ZADITOR) 0.025 % ophthalmic solution Place 1 drop into both eyes daily.    [provider]  ondansetron (ZOFRAN) 4 MG tablet Take 1 tablet (4 mg total) by mouth every 8 (eight) hours as needed for nausea or vomiting. Patient not taking: Reported on 11/28/2020 10/24/20   Sindy Guadeloupe, MD    Family History Family History  Problem Relation Age of Onset    Hypertension Mother    Thyroid disease Mother    Breast cancer Mother 84   Colon cancer Mother 56       again at 50   Atrial fibrillation Mother    Hip fracture Mother    Skin cancer Mother    Stroke Father    Hypertension Father    Cancer Father 8       prostate   Parkinson's disease Father    Skin cancer Father    Hypertension Sister    Thyroid disease Sister    Cancer Sister        skin/ squamous cell   Lung cancer Paternal Aunt 38       smoker   Diabetes Maternal Grandmother    Stroke Maternal Grandfather    Heart disease Paternal Grandfather    Diabetes Maternal Aunt    Lymphoma Maternal Uncle    Thyroid cancer Cousin        maternal   Heart disease Maternal Uncle    Cancer Maternal Aunt    Breast cancer Maternal Aunt 75   Breast cancer Cousin 60       maternal   Breast cancer Cousin 18   Colon cancer Cousin     Social History Social History   Tobacco Use   Smoking status: Never   Smokeless tobacco: Never  Vaping Use   Vaping Use: Never used  Substance Use Topics   Alcohol use: Yes    Alcohol/week: 0.0 standard drinks    Comment: rare, 1-2 drinks per year   Drug use: No     Allergies   Kiwi extract, Codeine, Grapeseed extract [nutritional supplements], Amoxicillin, Erythromycin, Sulfa antibiotics, and Tape   Review of Systems Review of Systems  Musculoskeletal:  Positive for arthralgias and joint swelling. Negative for gait problem.  Skin:  Positive for wound. Negative for color change and rash.  Neurological:  Negative for weakness and numbness.  Physical Exam Triage Vital Signs ED Triage Vitals  Enc Vitals Group     BP 12/21/20 0819 136/69     Pulse Rate 12/21/20 0819 88     Resp 12/21/20 0819 16     Temp 12/21/20 0819 99 F (37.2 C)     Temp Source 12/21/20 0819 Oral     SpO2 12/21/20 0819 98 %     Weight 12/21/20 0815 261 lb (118.4 kg)     Height 12/21/20 0815 _0  (1.6 m)     Head Circumference --      Peak Flow --      Pain  Score 12/21/20 0815 8     Pain Loc --      Pain Edu? --      Excl. in [place]? --    No data found.  Updated Vital Signs BP 136/69 (BP Location: Left Arm)   Pulse 88   Temp 99 F (37.2 C) (Oral)   Resp 16   Ht _1  (1.6 m)   Wt 261 lb (118.4 kg)   SpO2 98%   BMI 46.23 kg/m       Physical Exam Vitals and nursing note reviewed.  Constitutional:      General: She is not in acute distress.    Appearance: Normal appearance. She is obese. She is not ill-appearing or toxic-appearing.  HENT:     Head: Normocephalic and atraumatic.  Eyes:     General: No scleral icterus.       Right eye: No discharge.        Left eye: No discharge.     Conjunctiva/sclera: Conjunctivae normal.  Cardiovascular:     Rate and Rhythm: Normal rate and regular rhythm.     Heart sounds: Normal heart sounds.  Pulmonary:     Effort: Pulmonary effort is normal. No respiratory distress.     Breath sounds: Normal breath sounds.  Musculoskeletal:     Cervical back: Neck supple.     Right knee: Swelling (mild anterior joint swelling--laterallyl and medially) present. No erythema or ecchymosis. Normal range of motion (pain with full flexion). Tenderness present over the lateral joint line.  Skin:    General: Skin is dry.  Neurological:     General: No focal deficit present.     Mental Status: She is alert. Mental status is at baseline.     Motor: No weakness.     Gait: Gait normal.  Psychiatric:        Mood and Affect: Mood normal.        Behavior: Behavior normal.        Thought Content: Thought content normal.     UC Treatments / Results  Labs (all labs ordered are listed, but only abnormal results are displayed) Labs Reviewed - No data to display  EKG   Radiology No results found.  Procedures Procedures (including critical care time)  Medications Ordered in UC Medications - No data to display  Initial Impression / Assessment and Plan / UC Course  I have reviewed the triage vital signs  and the nursing notes.  Pertinent labs & imaging results that were available during my care of the patient were reviewed by me and considered in my medical decision making (see chart for details).  43 female presenting for right knee pain not due to injury.  Patient does report she was on her knees prior to her knee starting to hurt.  Patient's clinical presentation is most consistent with bursitis,  specifically bursitis of the semimembranosus bursa.  She does have good range of motion.  Patient given hinged/supportive knee brace today and advised on RICE guidelines.  Also advised she can take low-dose Advil as needed for pain and inflammation as well as Tylenol.  Patient has plenty of oxycodone at home.  Advised her to take that if she absolutely needs it for pain.  Advised her sometimes this condition can take a couple weeks to resolve but if it does not or if symptoms worsen she should follow-up with orthopedics to see if she needs to have a corticosteroid injection or other treatment.   Final Clinical Impressions(s) / UC Diagnoses   Final diagnoses:  Bursitis of other bursa of right knee  Acute pain of right knee     Discharge Instructions      KNEE PAIN: Rest the knee, ice it every couple of hours for the pain as.  Try to keep elevated.  Keep a pillow under the knees at night.  You can take low-dose Advil to help with inflammation and also he can take Tylenol.  Take your home oxycodone if absolutely needed for pain.  Also, you would benefit from wearing a knee brace provided today.  This may take a week or 2 to improve but if it does not or if it worsens you should follow-up with orthopedics.  You may have a condition requiring you to follow up with Orthopedics so please call one of the following office for appointment:   [facility] [address] [place], [place] [address] Phone: [phone]  [facility] [address] [place], [place] [address] Phone: [phone]       ED Prescriptions   None    I have reviewed the PDMP during this encounter.   Danton Clap, PA-C 12/21/20 [phone]

## 2020-12-22 ENCOUNTER — Other Ambulatory Visit (HOSPITAL_COMMUNITY): Payer: Self-pay

## 2020-12-22 MED FILL — Diltiazem HCl Coated Beads Cap ER 24HR 120 MG: ORAL | 30 days supply | Qty: 30 | Fill #1 | Status: AC

## 2020-12-24 ENCOUNTER — Other Ambulatory Visit: Payer: Self-pay | Admitting: Pharmacist

## 2020-12-24 ENCOUNTER — Other Ambulatory Visit (HOSPITAL_COMMUNITY): Payer: Self-pay

## 2020-12-26 ENCOUNTER — Inpatient Hospital Stay: Payer: 59

## 2020-12-26 ENCOUNTER — Inpatient Hospital Stay: Payer: 59 | Attending: Oncology

## 2020-12-26 VITALS — BP 138/69 | HR 78 | Temp 97.0°F | Resp 18

## 2020-12-26 DIAGNOSIS — C50911 Malignant neoplasm of unspecified site of right female breast: Secondary | ICD-10-CM | POA: Diagnosis not present

## 2020-12-26 DIAGNOSIS — C50919 Malignant neoplasm of unspecified site of unspecified female breast: Secondary | ICD-10-CM

## 2020-12-26 DIAGNOSIS — Z170 Estrogen receptor positive status [ER+]: Secondary | ICD-10-CM | POA: Insufficient documentation

## 2020-12-26 DIAGNOSIS — C7951 Secondary malignant neoplasm of bone: Secondary | ICD-10-CM | POA: Diagnosis not present

## 2020-12-26 LAB — COMPREHENSIVE METABOLIC PANEL
ALT: 17 U/L (ref 0–44)
AST: 18 U/L (ref 15–41)
Albumin: 4 g/dL (ref 3.5–5.0)
Alkaline Phosphatase: 83 U/L (ref 38–126)
Anion gap: 9 (ref 5–15)
BUN: 20 mg/dL (ref 6–20)
CO2: 24 mmol/L (ref 22–32)
Calcium: 9.4 mg/dL (ref 8.9–10.3)
Chloride: 104 mmol/L (ref 98–111)
Creatinine, Ser: 0.86 mg/dL (ref 0.44–1.00)
GFR, Estimated: >60 mL/min (ref >60)
Glucose, Bld: 111 mg/dL — ABNORMAL HIGH (ref 70–99)
Potassium: 4.8 mmol/L (ref 3.5–5.1)
Sodium: 137 mmol/L (ref 135–145)
Total Bilirubin: 0.6 mg/dL (ref 0.3–1.2)
Total Protein: 7.1 g/dL (ref 6.5–8.1)

## 2020-12-26 LAB — CBC WITH DIFFERENTIAL/PLATELET
Abs Immature Granulocytes: 0.01 10*3/uL (ref 0.00–0.07)
Basophils Absolute: 0 10*3/uL (ref 0.0–0.1)
Basophils Relative: 1 %
Eosinophils Absolute: 0.1 10*3/uL (ref 0.0–0.5)
Eosinophils Relative: 2 %
HCT: 37.9 % (ref 36.0–46.0)
Hemoglobin: 12.4 g/dL (ref 12.0–15.0)
Immature Granulocytes: 0 %
Lymphocytes Relative: 28 %
Lymphs Abs: 0.9 10*3/uL (ref 0.7–4.0)
MCH: 31.4 pg (ref 26.0–34.0)
MCHC: 32.7 g/dL (ref 30.0–36.0)
MCV: 95.9 fL (ref 80.0–100.0)
Monocytes Absolute: 0.4 10*3/uL (ref 0.1–1.0)
Monocytes Relative: 14 %
Neutro Abs: 1.7 10*3/uL (ref 1.7–7.7)
Neutrophils Relative %: 55 %
Platelets: 163 10*3/uL (ref 150–400)
RBC: 3.95 MIL/uL (ref 3.87–5.11)
RDW: 21.4 % — ABNORMAL HIGH (ref 11.5–15.5)
WBC: 3.2 10*3/uL — ABNORMAL LOW (ref 4.0–10.5)
nRBC: 0 % (ref 0.0–0.2)

## 2020-12-26 MED ORDER — SODIUM CHLORIDE 0.9 % IV SOLN
Freq: Once | INTRAVENOUS | Status: AC
  Filled 2020-12-26: qty 250

## 2020-12-26 MED ORDER — ZOLEDRONIC ACID 4 MG/100ML IV SOLN
4.0000 mg | INTRAVENOUS | Status: DC
  Administered 2020-12-26: 4 mg via INTRAVENOUS
  Filled 2020-12-26: qty 100

## 2020-12-26 NOTE — Patient Instructions (Signed)
[facility]   Discharge Instructions: Thank you for choosing [facility] to provide your oncology and hematology care.  If you have a lab appointment with the [facility], please go directly to the [facility] and check in at the registration area.  Wear comfortable clothing and clothing appropriate for easy access to any Portacath or PICC line.   We strive to give you quality time with your provider. You may need to reschedule your appointment if you arrive late (15 or more minutes).  Arriving late affects you and other patients whose appointments are after yours.  Also, if you miss three or more appointments without notifying the office, you may be dismissed from the clinic at the provider's discretion.      For prescription refill requests, have your pharmacy contact our office and allow 72 hours for refills to be completed.    Today you received zometa    To help prevent nausea and vomiting after your treatment, we encourage you to take your nausea medication as directed.  BELOW ARE SYMPTOMS THAT SHOULD BE REPORTED IMMEDIATELY: *FEVER GREATER THAN 100.4 F (38 C) OR HIGHER *CHILLS OR SWEATING *NAUSEA AND VOMITING THAT IS NOT CONTROLLED WITH YOUR NAUSEA MEDICATION *UNUSUAL SHORTNESS OF BREATH *UNUSUAL BRUISING OR BLEEDING *URINARY PROBLEMS (pain or burning when urinating, or frequent urination) *BOWEL PROBLEMS (unusual diarrhea, constipation, pain near the anus) TENDERNESS IN MOUTH AND THROAT WITH OR WITHOUT PRESENCE OF ULCERS (sore throat, sores in mouth, or a toothache) UNUSUAL RASH, SWELLING OR PAIN  UNUSUAL VAGINAL DISCHARGE OR ITCHING   Items with * indicate a potential emergency and should be followed up as soon as possible or go to the Emergency Department if any problems should occur.  Please show the CHEMOTHERAPY ALERT CARD or IMMUNOTHERAPY ALERT CARD at check-in to the Emergency Department and triage nurse.  Should you  have questions after your visit or need to cancel or reschedule your appointment, please contact [facility]  [phone] and follow the prompts.  Office hours are 8:00 a.m. to 4:30 p.m. Monday - Friday. Please note that voicemails left after 4:00 p.m. may not be returned until the following business day.  We are closed weekends and major holidays. You have access to a nurse at all times for urgent questions. Please call the main number to the clinic [phone] and follow the prompts.  For any non-urgent questions, you may also contact your provider using MyChart. We now offer e-Visits for anyone 52 and older to request care online for non-urgent symptoms. For details visit [email or web address].   Also download the MyChart app! Go to the app store, search "MyChart", open the app, select [facility], and log in with your MyChart username and password.  Due to Covid, a mask is required upon entering the hospital/clinic. If you do not have a mask, one will be given to you upon arrival. For doctor visits, patients may have 1 support person aged 15 or older with them. For treatment visits, patients cannot have anyone with them due to current Covid guidelines and our immunocompromised population.

## 2020-12-30 ENCOUNTER — Other Ambulatory Visit (HOSPITAL_COMMUNITY): Payer: Self-pay

## 2020-12-30 MED ORDER — OMEPRAZOLE 20 MG PO CPDR
DELAYED_RELEASE_CAPSULE | ORAL | 0 refills | Status: DC
  Filled 2020-12-30: qty 180, 90d supply, fill #0

## 2020-12-31 ENCOUNTER — Other Ambulatory Visit (HOSPITAL_COMMUNITY): Payer: Self-pay

## 2021-01-01 ENCOUNTER — Other Ambulatory Visit (HOSPITAL_COMMUNITY): Payer: Self-pay

## 2021-01-02 ENCOUNTER — Other Ambulatory Visit (HOSPITAL_COMMUNITY): Payer: Self-pay

## 2021-01-03 ENCOUNTER — Other Ambulatory Visit (HOSPITAL_COMMUNITY): Payer: Self-pay

## 2021-01-05 ENCOUNTER — Other Ambulatory Visit (HOSPITAL_COMMUNITY): Payer: Self-pay

## 2021-01-05 ENCOUNTER — Other Ambulatory Visit: Payer: Self-pay

## 2021-01-05 MED ORDER — BUPROPION HCL ER (XL) 300 MG PO TB24
ORAL_TABLET | ORAL | 1 refills | Status: DC
  Filled 2021-01-05 – 2021-01-28 (×3): qty 90, 90d supply, fill #0

## 2021-01-06 ENCOUNTER — Other Ambulatory Visit: Payer: Self-pay

## 2021-01-06 ENCOUNTER — Other Ambulatory Visit: Payer: Self-pay | Admitting: Oncology

## 2021-01-06 ENCOUNTER — Other Ambulatory Visit (HOSPITAL_COMMUNITY): Payer: Self-pay

## 2021-01-06 DIAGNOSIS — C50911 Malignant neoplasm of unspecified site of right female breast: Secondary | ICD-10-CM

## 2021-01-06 MED ORDER — PALBOCICLIB 100 MG PO TABS
100.0000 mg | ORAL_TABLET | Freq: Every day | ORAL | 2 refills | Status: DC
  Filled 2021-01-14: qty 21, 28d supply, fill #0
  Filled 2021-02-04: qty 21, 28d supply, fill #1
  Filled 2021-03-05: qty 21, 28d supply, fill #2

## 2021-01-07 ENCOUNTER — Other Ambulatory Visit (HOSPITAL_COMMUNITY): Payer: Self-pay

## 2021-01-14 ENCOUNTER — Other Ambulatory Visit (HOSPITAL_COMMUNITY): Payer: Self-pay

## 2021-01-15 ENCOUNTER — Other Ambulatory Visit (HOSPITAL_COMMUNITY): Payer: Self-pay

## 2021-01-16 ENCOUNTER — Other Ambulatory Visit (HOSPITAL_COMMUNITY): Payer: Self-pay

## 2021-01-19 ENCOUNTER — Other Ambulatory Visit: Payer: Self-pay

## 2021-01-19 ENCOUNTER — Other Ambulatory Visit (HOSPITAL_COMMUNITY): Payer: Self-pay

## 2021-01-19 MED ORDER — CITALOPRAM HYDROBROMIDE 40 MG PO TABS
40.0000 mg | ORAL_TABLET | Freq: Every day | ORAL | 0 refills | Status: DC
  Filled 2021-01-19: qty 90, 90d supply, fill #0

## 2021-01-19 MED FILL — Diltiazem HCl Coated Beads Cap ER 24HR 120 MG: ORAL | 90 days supply | Qty: 90 | Fill #2 | Status: AC

## 2021-01-20 ENCOUNTER — Other Ambulatory Visit: Payer: Self-pay

## 2021-01-22 ENCOUNTER — Other Ambulatory Visit (HOSPITAL_COMMUNITY): Payer: Self-pay

## 2021-01-22 MED ORDER — OMEPRAZOLE 20 MG PO CPDR
DELAYED_RELEASE_CAPSULE | ORAL | 1 refills | Status: DC
  Filled 2021-01-22: qty 180, fill #0
  Filled 2021-09-07: qty 180, 90d supply, fill #0

## 2021-01-22 MED ORDER — BUPROPION HCL ER (XL) 300 MG PO TB24
ORAL_TABLET | Freq: Every day | ORAL | 0 refills | Status: DC
  Filled 2021-01-22: qty 90, 90d supply, fill #0
  Filled 2021-01-22: qty 90, 30d supply, fill #0
  Filled 2021-01-23 – 2021-02-13 (×19): qty 90, 90d supply, fill #0
  Filled 2021-02-14: qty 90, fill #0
  Filled 2021-02-16 – 2021-03-28 (×10): qty 90, 90d supply, fill #0

## 2021-01-23 ENCOUNTER — Inpatient Hospital Stay: Payer: 59

## 2021-01-23 ENCOUNTER — Encounter: Payer: Self-pay | Admitting: Oncology

## 2021-01-23 ENCOUNTER — Other Ambulatory Visit (HOSPITAL_COMMUNITY): Payer: Self-pay

## 2021-01-23 ENCOUNTER — Inpatient Hospital Stay (HOSPITAL_BASED_OUTPATIENT_CLINIC_OR_DEPARTMENT_OTHER): Payer: 59 | Admitting: Oncology

## 2021-01-23 ENCOUNTER — Inpatient Hospital Stay: Payer: 59 | Attending: Oncology

## 2021-01-23 VITALS — BP 117/60 | HR 76 | Temp 98.1°F | Resp 18

## 2021-01-23 DIAGNOSIS — Z7983 Long term (current) use of bisphosphonates: Secondary | ICD-10-CM

## 2021-01-23 DIAGNOSIS — C773 Secondary and unspecified malignant neoplasm of axilla and upper limb lymph nodes: Secondary | ICD-10-CM | POA: Diagnosis not present

## 2021-01-23 DIAGNOSIS — C50911 Malignant neoplasm of unspecified site of right female breast: Secondary | ICD-10-CM | POA: Diagnosis not present

## 2021-01-23 DIAGNOSIS — C7951 Secondary malignant neoplasm of bone: Secondary | ICD-10-CM | POA: Insufficient documentation

## 2021-01-23 DIAGNOSIS — Z79811 Long term (current) use of aromatase inhibitors: Secondary | ICD-10-CM

## 2021-01-23 DIAGNOSIS — Z79899 Other long term (current) drug therapy: Secondary | ICD-10-CM | POA: Diagnosis not present

## 2021-01-23 DIAGNOSIS — C50919 Malignant neoplasm of unspecified site of unspecified female breast: Secondary | ICD-10-CM

## 2021-01-23 DIAGNOSIS — Z170 Estrogen receptor positive status [ER+]: Secondary | ICD-10-CM | POA: Insufficient documentation

## 2021-01-23 DIAGNOSIS — Z5181 Encounter for therapeutic drug level monitoring: Secondary | ICD-10-CM | POA: Diagnosis not present

## 2021-01-23 LAB — CBC WITH DIFFERENTIAL/PLATELET
Abs Immature Granulocytes: 0.02 10*3/uL (ref 0.00–0.07)
Basophils Absolute: 0 10*3/uL (ref 0.0–0.1)
Basophils Relative: 1 %
Eosinophils Absolute: 0.1 10*3/uL (ref 0.0–0.5)
Eosinophils Relative: 2 %
HCT: 35.5 % — ABNORMAL LOW (ref 36.0–46.0)
Hemoglobin: 11.9 g/dL — ABNORMAL LOW (ref 12.0–15.0)
Immature Granulocytes: 1 %
Lymphocytes Relative: 28 %
Lymphs Abs: 1 10*3/uL (ref 0.7–4.0)
MCH: 33.5 pg (ref 26.0–34.0)
MCHC: 33.5 g/dL (ref 30.0–36.0)
MCV: 100 fL (ref 80.0–100.0)
Monocytes Absolute: 0.4 10*3/uL (ref 0.1–1.0)
Monocytes Relative: 13 %
Neutro Abs: 1.9 10*3/uL (ref 1.7–7.7)
Neutrophils Relative %: 55 %
Platelets: 161 10*3/uL (ref 150–400)
RBC: 3.55 MIL/uL — ABNORMAL LOW (ref 3.87–5.11)
RDW: 17.8 % — ABNORMAL HIGH (ref 11.5–15.5)
WBC: 3.4 10*3/uL — ABNORMAL LOW (ref 4.0–10.5)
nRBC: 0 % (ref 0.0–0.2)

## 2021-01-23 LAB — COMPREHENSIVE METABOLIC PANEL
ALT: 17 U/L (ref 0–44)
AST: 17 U/L (ref 15–41)
Albumin: 4 g/dL (ref 3.5–5.0)
Alkaline Phosphatase: 89 U/L (ref 38–126)
Anion gap: 8 (ref 5–15)
BUN: 24 mg/dL — ABNORMAL HIGH (ref 6–20)
CO2: 23 mmol/L (ref 22–32)
Calcium: 9 mg/dL (ref 8.9–10.3)
Chloride: 106 mmol/L (ref 98–111)
Creatinine, Ser: 0.82 mg/dL (ref 0.44–1.00)
GFR, Estimated: >60 mL/min (ref >60)
Glucose, Bld: 100 mg/dL — ABNORMAL HIGH (ref 70–99)
Potassium: 4.8 mmol/L (ref 3.5–5.1)
Sodium: 137 mmol/L (ref 135–145)
Total Bilirubin: 0.3 mg/dL (ref 0.3–1.2)
Total Protein: 7.2 g/dL (ref 6.5–8.1)

## 2021-01-23 MED ORDER — SODIUM CHLORIDE 0.9 % IV SOLN
Freq: Once | INTRAVENOUS | Status: AC
  Filled 2021-01-23: qty 250

## 2021-01-23 MED ORDER — ZOLEDRONIC ACID 4 MG/100ML IV SOLN
4.0000 mg | INTRAVENOUS | Status: DC
  Administered 2021-01-23: 4 mg via INTRAVENOUS
  Filled 2021-01-23: qty 100

## 2021-01-23 NOTE — Patient Instructions (Signed)

## 2021-01-23 NOTE — Progress Notes (Signed)
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  It was a 1.4 cm tumor with negative lymph nodes grade 1 and patient went on to get lumpectomy followed by adjuvant radiation therapy.  Oncotype DX score was 6 and she did not require adjuvant chemotherapy.  She was on tamoxifen for 5 years.  She had BRCA testing done at that time which was negative.   Patient felt a lump in her right axilla in September 2021.  She underwent ultrasound of bilateral breasts which did not pick up any axillary mass.  A prior screening mammogram in September 2021 was unremarkable.  She was then admitted for Covid pneumonia and underwent CT angio chest on 07/22/2020 which incidentally picked up an axillary mass measuring 2.5 cm.  No obvious breast mass.  Axillary mass was biopsied and was consistent with high-grade invasive mammary carcinoma.  IHC was positive for GATA3 with diffuse strong nuclear staining.  There was no lymph node architecture identified in the sample and it could not be determined if it is a primary tumor within the axillary breast tissue or metastases.  ER strongly positive greater than 90%, PR 1 to 10% positive and HER-2 negative   PET CT scan showed hypermetabolic poorly marginated solid right axillary mass 2.5 x 2 cm with an SUV of 5.6.  No enlarged hypermetabolic mediastinal or hilar lymph  nodes no enlarged left axillary lymph nodes.  Subcentimeter lung nodules below PET resolution.  Small hypermetabolism in the right liver with an SUV of 6 without CT correlate equivocal for liver metastases.  Multiple hypermetabolic faintly lytic osseous lesions throughout the lumbar spine and bilateral pelvic girdle with representative supra-acetabular right iliac bone 1.9 cm lesion, anterior superior left acetabular 1.7 cm lesion and L1 1.2 cm lesion.   NGS testing showed no actionable mutations.  PD-L1 less than 1%.  CHEK2 S422 FS, K3 CA and 1068 FS, PICC 3 CA and 345H, CCN E1 gain, ERB B2 1 P-CSF 1 hour fusion, FGF 19 gain, FGF 3 gain, FGF 4gain Ibrance plus letrozole started in February 2022.  Baseline tumor markers not elevated    Interval history-patient has ongoing fatigue.  She reports low back pain which is essentially well controlled with her pain medications.  Reports that her stools have been loose without frank diarrhea over the last 1 week.  ECOG PS- 1 Pain scale- 3 Opioid associated constipation- no  Review of systems- Review of Systems  Constitutional:  Positive for malaise/fatigue. Negative for chills, fever and weight loss.  HENT:  Negative for congestion, ear discharge and nosebleeds.   Eyes:  Negative for blurred vision.  Respiratory:  Negative for cough, hemoptysis, sputum production, shortness of breath and wheezing.   Cardiovascular:  Negative for chest pain, palpitations, orthopnea and claudication.  Gastrointestinal:  Negative for abdominal pain, blood in stool, constipation, diarrhea, heartburn, melena, nausea and vomiting.  Genitourinary:  Negative for dysuria, flank pain, frequency, hematuria  and urgency.  Musculoskeletal:  Negative for back pain, joint pain and myalgias.  Skin:  Negative for rash.  Neurological:  Negative for dizziness, tingling, focal weakness, seizures, weakness and headaches.  Endo/Heme/Allergies:  Does not bruise/bleed easily.   Psychiatric/Behavioral:  Negative for depression and suicidal ideas. The patient does not have insomnia.       Allergies  Allergen Reactions   Kiwi Extract Itching and Swelling    The real kiwi fruit   Codeine Other (See Comments)    Felt funny.   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   left lower leg   Central serous retinopathy    CHEK2-related breast cancer ([place]) 07/2020   Chicken pox    Depression    Fatty liver    Fatty liver    Gastric polyps    GERD (gastroesophageal reflux disease)    Gestational diabetes    prediabetes out side of having baby   Headache    migraines   Heart murmur    History of abnormal mammogram 08/2006, 01/2008, 03/2009   Hypertension    IBS (irritable bowel syndrome)    Joint disorder    hx of left ankle tendonitis, bursitis, heel spur   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 Smith/ laprascopic surgery   COLONOSCOPY  04/04/2000   COLONOSCOPY WITH PROPOFOL N/A 02/12/2019   Procedure: COLONOSCOPY WITH PROPOFOL;  Surgeon: Lollie Sails, MD;  Location: [facility] ENDOSCOPY;  Service: Endoscopy;  Laterality: N/A;   ESOPHAGOGASTRODUODENOSCOPY (EGD) WITH PROPOFOL N/A 05/01/2015   Procedure: ESOPHAGOGASTRODUODENOSCOPY (EGD) WITH PROPOFOL;  Surgeon: Hulen Luster, MD;  Location: [facility] ENDOSCOPY;  Service: Gastroenterology;  Laterality: N/A;   ESOPHAGOGASTRODUODENOSCOPY (EGD) WITH PROPOFOL N/A 02/12/2019   Procedure: ESOPHAGOGASTRODUODENOSCOPY (EGD) WITH PROPOFOL;  Surgeon: Lollie Sails, MD;  Location: [facility] ENDOSCOPY;  Service: Endoscopy;  Laterality: N/A;   EYE SURGERY  08/2012   laser surgery on retina/ DR Appenzeller   FINGER SURGERY     repair of tendon in right index, Dr. Francesco Sor in [facility]   FOOT SURGERY     Dr. Milinda Pointer   IRRIGATION AND  DEBRIDEMENT SEBACEOUS CYST     right upper back   LAPAROSCOPIC SUPRACERVICAL HYSTERECTOMY  06/2007   Dr Kincius/ adhesions/CPP/adenomyosis   MASTECTOMY PARTIAL / LUMPECTOMY Right 01/2008   with sentinal lymph node biopsy/ infiltrating ductal cancer   REPAIR PERONEAL TENDONS ANKLE  10/2019   with tarsal exostectomy and sural neuroma excision on right    SKIN CANCER EXCISION     back and calves   WISDOM TOOTH EXTRACTION  1991    Social History   Socioeconomic History   Marital status: Married    Spouse name: Richardson Landry   Number of children: 1   Years of education: 14   Highest education level: Not on file  Occupational History   Occupation: CMA  Tobacco Use   Smoking status: Never   Smokeless tobacco: Never  Vaping Use   Vaping Use: Never used  Substance and Sexual Activity   Alcohol use: Yes    Alcohol/week: 0.0 standard drinks    Comment: rare, 1-2 drinks per year   Drug use: No   Sexual activity: Yes    Partners: Male    Birth control/protection:  Surgical    Comment: Hysterectomy   Other Topics Concern   Not on file  Social History Narrative   Lives in [place] with husband, no pets.  Works at [employer].      Diet - regular   Exercise - occasional   Social Determinants of Health   Financial Resource Strain: Not on file  Food Insecurity: Not on file  Transportation Needs: Not on file  Physical Activity: Not on file  Stress: Not on file  Social Connections: Not on file  Intimate Partner Violence: Not on file    Family History  Problem Relation Age of Onset   Hypertension Mother    Thyroid disease Mother    Breast cancer Mother 56   Colon cancer Mother 48       again at 94   Atrial fibrillation Mother    Hip fracture Mother    Skin cancer Mother    Stroke Father    Hypertension Father    Cancer Father 52       prostate   Parkinson's disease Father    Skin cancer Father    Hypertension Sister    Thyroid disease Sister    Cancer Sister        skin/ squamous cell   Lung cancer Paternal Aunt 80       smoker   Diabetes Maternal Grandmother    Stroke Maternal Grandfather    Heart disease Paternal Grandfather    Diabetes Maternal Aunt    Lymphoma Maternal Uncle    Thyroid cancer Cousin        maternal   Heart disease Maternal Uncle    Cancer Maternal Aunt    Breast cancer Maternal Aunt 75   Breast cancer Cousin 60       maternal   Breast cancer Cousin 54   Colon cancer Cousin      Current Outpatient Medications:    acetaminophen (TYLENOL) 325 MG tablet, Take 650 mg by mouth every 6 (six) hours as needed for mild pain or headache., Disp: , Rfl:    buPROPion (WELLBUTRIN XL) 300 MG 24 hr tablet, Take 1 tablet (300 mg total) by mouth once daily, Disp: 90 tablet, Rfl: 1   buPROPion (WELLBUTRIN XL) 300 MG 24 hr tablet, TAKE 1 TABLET BY MOUTH ONCE DAILY, Disp: 90 tablet, Rfl: 0   citalopram (  CELEXA) 40 MG tablet, Take 40 mg by mouth at bedtime., Disp: , Rfl:    citalopram (CELEXA) 40 MG tablet, Take 1 tablet (40  mg total) by mouth daily., Disp: 90 tablet, Rfl: 0   clotrimazole (LOTRIMIN) 1 % cream, Apply 1 application topically daily. groin, Disp: , Rfl:    clotrimazole-betamethasone (LOTRISONE) cream, APPLY EXTERNALLY TWICE A DAY FOR 2 WEEKS, Disp: 45 g, Rfl: 0   diltiazem (CARDIZEM CD) 120 MG 24 hr capsule, TAKE 1 CAPSULE BY MOUTH DAILY., Disp: 30 capsule, Rfl: 10   diphenhydrAMINE (BENADRYL) 25 MG tablet, Take 25 mg by mouth at bedtime as needed for sleep., Disp: , Rfl:    gabapentin (NEURONTIN) 100 MG capsule, Take 1 capsule (100 mg total) by mouth 3 (three) times daily, Disp: 270 capsule, Rfl: 1   ketotifen (ZADITOR) 0.025 % ophthalmic solution, Place 1 drop into both eyes daily., Disp: , Rfl:    letrozole (FEMARA) 2.5 MG tablet, TAKE 1 TABLET (2.5 MG TOTAL) BY MOUTH DAILY., Disp: 90 tablet, Rfl: 4   lisinopril (ZESTRIL) 10 MG tablet, TAKE 1 TABLET BY MOUTH ONCE DAILY, Disp: 90 tablet, Rfl: 1   metFORMIN (GLUCOPHAGE-XR) 500 MG 24 hr tablet, TAKE 1 TABLET BY MOUTH TWICE DAILY WITH MEALS, Disp: 180 tablet, Rfl: 1   Multiple Vitamin (MULTIVITAMIN) tablet, Take 1 tablet by mouth daily., Disp: , Rfl:    omeprazole (PRILOSEC) 20 MG capsule, Take 1 capsule (20 mg total) by mouth 2 (two) times daily before meals for 90 days, Disp: 180 capsule, Rfl: 0   omeprazole (PRILOSEC) 20 MG capsule, TAKE 1 CAPSULE BY MOUTH 2 TIMES DAILY TAKE 30 MIN BEFORE MEALS., Disp: 180 capsule, Rfl: 1   oxyCODONE (OXY IR/ROXICODONE) 5 MG immediate release tablet, TAKE 1 TABLET (5 MG TOTAL) BY MOUTH EVERY 6 (SIX) HOURS AS NEEDED FOR SEVERE PAIN., Disp: 60 tablet, Rfl: 0   palbociclib (IBRANCE) 100 MG tablet, Take 1 tablet (100 mg total) by mouth daily. Take for 21 days on, 7 days off, repeat every 28 days., Disp: 21 tablet, Rfl: 2   COVID-19 At Home Antigen Test KIT, USE AS DIRECTED WITHIN PACKAGE INSTRUCTIONS (Patient not taking: Reported on 11/28/2020), Disp: 4 kit, Rfl: 0   COVID-19 mRNA vaccine, Pfizer, 30 MCG/0.3ML injection, USE  AS DIRECTED, Disp: .3 mL, Rfl: 0   ondansetron (ZOFRAN) 4 MG tablet, Take 1 tablet (4 mg total) by mouth every 8 (eight) hours as needed for nausea or vomiting. (Patient not taking: No sig reported), Disp: 30 tablet, Rfl: 0 No current facility-administered medications for this visit.  Facility-Administered Medications Ordered in Other Visits:    Zoledronic Acid (ZOMETA) IVPB 4 mg, 4 mg, Intravenous, Q28 days, Sindy Guadeloupe, MD, Stopped at 01/23/21 1214  Physical exam:  Vitals:   01/23/21 1029  BP: 117/60  Pulse: 76  Resp: 18  Temp: 98.1 F (36.7 C)  TempSrc: Tympanic  SpO2: 100%   Physical Exam Constitutional:      General: She is not in acute distress. Cardiovascular:     Rate and Rhythm: Normal rate and regular rhythm.     Heart sounds: Normal heart sounds.  Pulmonary:     Effort: Pulmonary effort is normal.     Breath sounds: Normal breath sounds.  Lymphadenopathy:     Comments: No palpable right axillary adenopathy  Skin:    General: Skin is warm and dry.  Neurological:     Mental Status: She is alert and oriented to person, place, and  time.     CMP Latest Ref Rng & Units 01/23/2021  Glucose 70 - 99 mg/dL 100(H)  BUN 6 - 20 mg/dL 24(H)  Creatinine 0.44 - 1.00 mg/dL 0.82  Sodium 135 - 145 mmol/L 137  Potassium 3.5 - 5.1 mmol/L 4.8  Chloride 98 - 111 mmol/L 106  CO2 22 - 32 mmol/L 23  Calcium 8.9 - 10.3 mg/dL 9.0  Total Protein 6.5 - 8.1 g/dL 7.2  Total Bilirubin 0.3 - 1.2 mg/dL 0.3  Alkaline Phos 38 - 126 U/L 89  AST 15 - 41 U/L 17  ALT 0 - 44 U/L 17   CBC Latest Ref Rng & Units 01/23/2021  WBC 4.0 - 10.5 K/uL 3.4(L)  Hemoglobin 12.0 - 15.0 g/dL 11.9(L)  Hematocrit 36.0 - 46.0 % 35.5(L)  Platelets 150 - 400 K/uL 161     Assessment and plan- Patient is a 60 y.o. female with metastatic ER positive HER2 negative breast cancer with bone metastases on first-line letrozole and Ibrance here for routine follow-up visit  Patient is tolerating letrozole well and  will continue to take that with Ibrance 100 mg 3 weeks on and 1 week off until progression or toxicity.  Counts are acceptable to proceed with next cycle of Ibrance.  She does have as needed oxycodone for her low back pain.  Diarrhea possibly secondary to [facility] which we will continue to monitor  Zometa today and she will continue to get that monthly and I will see her back in 2 months with CBC with differential CMP and CA 27-29.  She will get CT chest abdomen and pelvis without contrast and bone scan prior to her visit with me    Visit Diagnosis 1. Metastatic breast cancer ([place])   2. High risk medication use   3. Use of letrozole (Femara)   4. Encounter for monitoring zoledronic acid therapy      Dr. Randa Evens, MD, MPH [facility] [phone] 01/23/2021 4:11 PM

## 2021-01-23 NOTE — Progress Notes (Signed)
Breast Ca follow up. Taking femara and  ibrance. Felt great until last week. Sudden onset of fatigue. Also stool consistency has changed ~ thin ribbon like stools. Having some neuropathy in LE's. Discussed Super B vitamins.

## 2021-01-24 ENCOUNTER — Other Ambulatory Visit (HOSPITAL_COMMUNITY): Payer: Self-pay

## 2021-01-24 LAB — CANCER ANTIGEN 27.29: CA 27.29: 27.6 U/mL (ref 0.0–38.6)

## 2021-01-25 ENCOUNTER — Other Ambulatory Visit: Payer: Self-pay | Admitting: *Deleted

## 2021-01-26 ENCOUNTER — Other Ambulatory Visit (HOSPITAL_COMMUNITY): Payer: Self-pay

## 2021-01-27 ENCOUNTER — Other Ambulatory Visit (HOSPITAL_COMMUNITY): Payer: Self-pay

## 2021-01-28 ENCOUNTER — Other Ambulatory Visit: Payer: Self-pay

## 2021-01-28 ENCOUNTER — Other Ambulatory Visit (HOSPITAL_COMMUNITY): Payer: Self-pay

## 2021-01-28 NOTE — Progress Notes (Signed)
PCP: Derinda Late, MD   Chief Complaint  Patient presents with   Gynecologic Exam    HPI:      [name] is a 60 y.o. G1P0101 whose LMP was No LMP recorded. Patient has had a hysterectomy., presents today for her annual examination.  Her menses are absent due to supracx hyst. No PMB. She does have vasomotor sx, worse now with femara tx. Can't do hormones.   Sex activity: single partner, contraception - post menopausal status. She does have vaginal dryness improved with lubricants. Has noticed some tenderness to the LT of the clitoris during sex, occas with wiping/washing.   Last Pap: 01/23/20 Results were: no abnormalities /neg HPV DNA 2019.  Hx of STDs: none  Last mammogram: 08/20/20 Results were: cat 6 for RT axillary mass that ws positive for recurrent metastatic breast cancer on bx. Pt followed by oncology at [facility] and has seen [facility]. She is on [place]. Has had good tx response so far. Mets documented to pelvic girdle, liver and spine. Doing fosamax for bone. Did short course radiation tx to leg due to pain. Sx improved. There is a FH of breast cancer in her mom and mat aunt. Mom also with colon cancer.  There is no FH of ovarian cancer. The patient does do self-breast exams. Pt was BRCA neg 2004 but never had update testing. MyRisk panel done 2/22 was CHEK 2 positive.  Colonoscopy: 2020 without abnormalities;  Repeat due after 5 years per NCCN guidelines. Mother with hx of colon cancer but pt found to be CHEK2 positive (increased risk of colon cancer)   Tobacco use: The patient denies current or previous tobacco use. Alcohol use: none Exercise: not active  She does get adequate calcium and Vitamin D in her diet.  Labs with PCP. DEXA with oncology.  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   Chicken pox    Depression    Fatty liver    Fatty liver    Gastric polyps    GERD (gastroesophageal reflux disease)    Gestational diabetes    prediabetes out side of having baby   Headache    migraines   Heart murmur    History of abnormal mammogram 08/2006, 01/2008, 03/2009   Hypertension    IBS (irritable bowel syndrome)    Joint disorder    hx of left ankle tendonitis, bursitis, heel spur   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 Smith/ laprascopic surgery   COLONOSCOPY  04/04/2000   COLONOSCOPY WITH PROPOFOL N/A 02/12/2019   Procedure: COLONOSCOPY WITH PROPOFOL;  Surgeon: Lollie Sails, MD;  Location: [facility] ENDOSCOPY;  Service: Endoscopy;  Laterality: N/A;   ESOPHAGOGASTRODUODENOSCOPY (EGD) WITH PROPOFOL N/A 05/01/2015   Procedure: ESOPHAGOGASTRODUODENOSCOPY (EGD) WITH PROPOFOL;  Surgeon: Hulen Luster, MD;  [facility] ENDOSCOPY;  Service: Gastroenterology;  Laterality: N/A;   ESOPHAGOGASTRODUODENOSCOPY (EGD) WITH PROPOFOL N/A 02/12/2019   Procedure: ESOPHAGOGASTRODUODENOSCOPY (EGD) WITH PROPOFOL;  Surgeon: Lollie Sails, MD;  Location: [facility]  ENDOSCOPY;  Service: Endoscopy;  Laterality: N/A;   EYE SURGERY  08/2012   laser surgery on retina/ DR Appenzeller   FINGER SURGERY     repair of tendon in right index, Dr. Francesco Sor in [place]   FOOT SURGERY     Dr. Milinda Pointer   IRRIGATION AND DEBRIDEMENT SEBACEOUS CYST     right upper back   LAPAROSCOPIC SUPRACERVICAL HYSTERECTOMY  06/2007   Dr Kincius/ adhesions/CPP/adenomyosis   MASTECTOMY PARTIAL / LUMPECTOMY Right 01/2008   with sentinal lymph node biopsy/ infiltrating ductal cancer   REPAIR PERONEAL TENDONS ANKLE  10/2019   with tarsal exostectomy and sural neuroma excision on right    SKIN CANCER EXCISION     back and calves   WISDOM TOOTH EXTRACTION  1991    Family History  Problem Relation Age of Onset   Hypertension Mother    Thyroid disease Mother    Breast cancer Mother 35   Colon cancer Mother 40       again at 72   Atrial fibrillation Mother    Hip fracture Mother    Skin cancer Mother    Stroke Father    Hypertension Father    Cancer Father 34       prostate   Parkinson's disease Father    Skin cancer Father    Hypertension Sister    Thyroid disease Sister    Cancer Sister        skin/ squamous cell   Lung cancer Paternal Aunt 30       smoker   Diabetes Maternal Grandmother    Stroke Maternal Grandfather    Heart disease Paternal Grandfather    Diabetes Maternal Aunt    Lymphoma Maternal Uncle    Thyroid cancer Cousin        maternal   Heart disease Maternal Uncle    Cancer Maternal Aunt    Breast cancer Maternal Aunt 75   Breast cancer Cousin 60       maternal   Breast cancer Cousin 22   Colon cancer Cousin     Social History   Socioeconomic History   Marital status: Married    Spouse name: Richardson Landry   Number of children: 1   Years of education: 14   Highest education level: Not on file  Occupational History   Occupation: CMA  Tobacco Use   Smoking status: Never   Smokeless tobacco: Never  Vaping Use   Vaping Use: Never used   Substance and Sexual Activity   Alcohol use: Yes    Alcohol/week: 0.0 standard drinks    Comment: rare, 1-2 drinks per year   Drug use: No   Sexual activity: Yes    Partners: Male    Birth control/protection: Surgical    Comment: Hysterectomy  Other Topics Concern   Not on file  Social History Narrative   Lives in [place] with husband, no pets.  Works at [employer].      Diet - regular   Exercise - occasional   Social Determinants of Health   Financial Resource Strain: Not on file  Food Insecurity: Not on file  Transportation Needs: Not on file  Physical Activity: Not on file  Stress: Not on file  Social Connections: Not on file  Intimate Partner Violence: Not on file     Current Outpatient Medications:    acetaminophen (TYLENOL) 325 MG tablet, Take 650 mg by mouth every 6 (six) hours as needed for mild pain or headache., Disp: , Rfl:    buPROPion (WELLBUTRIN XL) 300 MG 24 hr tablet, TAKE 1 TABLET BY MOUTH ONCE DAILY, Disp: 90 tablet, Rfl: 0   citalopram (CELEXA) 40 MG tablet, Take 1 tablet (40 mg total) by mouth daily., Disp: 90 tablet, Rfl: 0   clotrimazole (LOTRIMIN) 1 % cream, Apply 1 application topically daily. groin, Disp: , Rfl:    clotrimazole-betamethasone (LOTRISONE) cream, APPLY EXTERNALLY TWICE A DAY FOR 2 WEEKS, Disp: 45 g, Rfl: 0   COVID-19 mRNA vaccine, Pfizer, 30 MCG/0.3ML injection, USE AS DIRECTED, Disp: .3 mL, Rfl: 0   diltiazem (CARDIZEM CD) 120 MG 24 hr capsule, TAKE 1 CAPSULE BY MOUTH DAILY., Disp: 30 capsule, Rfl: 10   diphenhydrAMINE (BENADRYL) 25 MG tablet, Take 25 mg by mouth at bedtime as needed for sleep., Disp: , Rfl:    gabapentin (NEURONTIN) 100 MG capsule, Take 1 capsule (100 mg total) by mouth 3 (three) times daily, Disp: 270 capsule, Rfl: 1   ketotifen (ZADITOR) 0.025 % ophthalmic solution, Place 1 drop into both eyes daily., Disp: , Rfl:    letrozole (FEMARA) 2.5 MG tablet, TAKE 1 TABLET (2.5 MG TOTAL) BY MOUTH DAILY., Disp: 90 tablet,  Rfl: 4   lisinopril (ZESTRIL) 10 MG tablet, TAKE 1 TABLET BY MOUTH ONCE DAILY, Disp: 90 tablet, Rfl: 1   metFORMIN (GLUCOPHAGE-XR) 500 MG 24 hr tablet, TAKE 1 TABLET BY MOUTH TWICE DAILY WITH MEALS, Disp: 180 tablet, Rfl: 1   Multiple Vitamin (MULTIVITAMIN) tablet, Take 1 tablet by mouth daily., Disp: , Rfl:    omeprazole (PRILOSEC) 20 MG capsule, TAKE 1 CAPSULE BY MOUTH 2 TIMES DAILY TAKE 30 MIN BEFORE MEALS., Disp: 180 capsule, Rfl: 1   oxyCODONE (OXY IR/ROXICODONE) 5 MG immediate release tablet, TAKE 1 TABLET (5 MG TOTAL) BY MOUTH EVERY 6 (SIX) HOURS AS NEEDED FOR SEVERE PAIN., Disp: 60 tablet, Rfl: 0   palbociclib (IBRANCE) 100 MG tablet, Take 1 tablet (100 mg total) by mouth daily. Take for 21 days on, 7 days off, repeat every 28 days., Disp: 21 tablet, Rfl: 2   COVID-19 At Home Antigen Test KIT, USE AS DIRECTED WITHIN PACKAGE INSTRUCTIONS (Patient not taking: No sig reported), Disp: 4 kit, Rfl: 0   ondansetron (ZOFRAN) 4 MG tablet, Take 1 tablet (4 mg total) by mouth every 8 (eight) hours as needed for nausea or vomiting. (Patient not taking: No sig reported), Disp: 30 tablet, Rfl: 0     ROS:  Review of Systems  Constitutional:  Negative for fatigue, fever and unexpected weight change.  Respiratory:  Negative for cough, shortness of breath and wheezing.   Cardiovascular:  Negative for chest pain, palpitations and leg swelling.  Gastrointestinal:  Negative for blood in stool, constipation, diarrhea, nausea and vomiting.  Endocrine: Negative for cold intolerance, heat intolerance  and polyuria.  Genitourinary:  Negative for dyspareunia, dysuria, flank pain, frequency, genital sores, hematuria, menstrual problem, pelvic pain, urgency, vaginal bleeding, vaginal discharge and vaginal pain.  Musculoskeletal:  Positive for arthralgias and back pain. Negative for joint swelling and myalgias.  Skin:  Negative for rash.  Neurological:  Positive for dizziness and headaches. Negative for syncope,  light-headedness and numbness.  Hematological:  Negative for adenopathy.  Psychiatric/Behavioral:  Positive for agitation and dysphoric mood. Negative for confusion, sleep disturbance and suicidal ideas. The patient is not nervous/anxious.   BREAST: No symptoms    Objective: BP 122/80   Ht $R'5\' 3"'Al$  (1.6 m)   Wt 265 lb (120.2 kg)   BMI 46.94 kg/m    Physical Exam Constitutional:      Appearance: She is well-developed.  Genitourinary:     Vulva normal.     Right Labia: No rash, tenderness or lesions.    Left Labia: No tenderness, lesions or rash.    Vulva exam comments: NEG CLITORAL EXAM FOR TENDERNESS.     No vaginal discharge, erythema or tenderness.      Right Adnexa: not tender and no mass present.    Left Adnexa: not tender and no mass present.    No cervical friability or polyp.     Uterus is absent.  Breasts:    Right: No mass, nipple discharge, skin change or tenderness.     Left: No mass, nipple discharge, skin change or tenderness.  Neck:     Thyroid: No thyromegaly.  Cardiovascular:     Rate and Rhythm: Normal rate and regular rhythm.     Heart sounds: Normal heart sounds. No murmur heard. Pulmonary:     Effort: Pulmonary effort is normal.     Breath sounds: Normal breath sounds.  Abdominal:     Palpations: Abdomen is soft.     Tenderness: There is no abdominal tenderness. There is no guarding or rebound.  Musculoskeletal:        General: Normal range of motion.     Cervical back: Normal range of motion.  Lymphadenopathy:     Cervical: No cervical adenopathy.  Neurological:     General: No focal deficit present.     Mental Status: She is alert and oriented to person, place, and time.     Cranial Nerves: No cranial nerve deficit.  Skin:    General: Skin is warm and dry.  Psychiatric:        Mood and Affect: Mood normal.        Behavior: Behavior normal.        Thought Content: Thought content normal.        Judgment: Judgment normal.  Vitals reviewed.     Assessment/Plan:  Encounter for annual routine gynecological examination  Recurrent breast cancer, right (HCC)--followed by oncology. Doing well with tx so far.           GYN counsel breast self exam, menopause, ca and Vit D.    F/U  Return in about 1 year (around 01/29/2022).  Albertia Carvin B. Kayd Launer, PA-C 01/29/2021 8:11 AM

## 2021-01-29 ENCOUNTER — Other Ambulatory Visit (HOSPITAL_COMMUNITY): Payer: Self-pay

## 2021-01-29 ENCOUNTER — Ambulatory Visit (INDEPENDENT_AMBULATORY_CARE_PROVIDER_SITE_OTHER): Payer: 59 | Admitting: Obstetrics and Gynecology

## 2021-01-29 ENCOUNTER — Other Ambulatory Visit: Payer: Self-pay

## 2021-01-29 ENCOUNTER — Encounter: Payer: Self-pay | Admitting: Obstetrics and Gynecology

## 2021-01-29 VITALS — BP 122/80 | Ht 63.0 in | Wt 265.0 lb

## 2021-01-29 DIAGNOSIS — Z01419 Encounter for gynecological examination (general) (routine) without abnormal findings: Secondary | ICD-10-CM

## 2021-01-29 DIAGNOSIS — C50911 Malignant neoplasm of unspecified site of right female breast: Secondary | ICD-10-CM

## 2021-01-29 NOTE — Patient Instructions (Signed)
I value your feedback and you entrusting us with your care. If you get a [facility] patient survey, I would appreciate you taking the time to let us know about your experience today. Thank you! ? ? ?

## 2021-01-30 ENCOUNTER — Other Ambulatory Visit (HOSPITAL_COMMUNITY): Payer: Self-pay

## 2021-01-31 ENCOUNTER — Other Ambulatory Visit (HOSPITAL_COMMUNITY): Payer: Self-pay

## 2021-02-02 ENCOUNTER — Other Ambulatory Visit (HOSPITAL_COMMUNITY): Payer: Self-pay

## 2021-02-03 ENCOUNTER — Other Ambulatory Visit (HOSPITAL_COMMUNITY): Payer: Self-pay

## 2021-02-04 ENCOUNTER — Other Ambulatory Visit (HOSPITAL_COMMUNITY): Payer: Self-pay

## 2021-02-04 MED FILL — Letrozole Tab 2.5 MG: ORAL | 90 days supply | Qty: 90 | Fill #1 | Status: AC

## 2021-02-05 ENCOUNTER — Other Ambulatory Visit (HOSPITAL_COMMUNITY): Payer: Self-pay

## 2021-02-06 ENCOUNTER — Other Ambulatory Visit (HOSPITAL_COMMUNITY): Payer: Self-pay

## 2021-02-07 ENCOUNTER — Other Ambulatory Visit (HOSPITAL_COMMUNITY): Payer: Self-pay

## 2021-02-09 ENCOUNTER — Other Ambulatory Visit (HOSPITAL_COMMUNITY): Payer: Self-pay

## 2021-02-10 ENCOUNTER — Encounter: Payer: Self-pay | Admitting: Oncology

## 2021-02-10 ENCOUNTER — Other Ambulatory Visit (HOSPITAL_COMMUNITY): Payer: Self-pay

## 2021-02-11 ENCOUNTER — Other Ambulatory Visit (HOSPITAL_COMMUNITY): Payer: Self-pay

## 2021-02-12 ENCOUNTER — Telehealth: Payer: Self-pay | Admitting: *Deleted

## 2021-02-12 ENCOUNTER — Other Ambulatory Visit (HOSPITAL_COMMUNITY): Payer: Self-pay

## 2021-02-12 NOTE — Telephone Encounter (Signed)
Pt just needs an extension of all the other information can stay the same.  I told the patient that I will fill it out either today or tomorrow consented and patient is agreeable

## 2021-02-13 ENCOUNTER — Other Ambulatory Visit (HOSPITAL_COMMUNITY): Payer: Self-pay

## 2021-02-14 ENCOUNTER — Other Ambulatory Visit (HOSPITAL_COMMUNITY): Payer: Self-pay

## 2021-02-16 ENCOUNTER — Other Ambulatory Visit (HOSPITAL_COMMUNITY): Payer: Self-pay

## 2021-02-17 ENCOUNTER — Other Ambulatory Visit (HOSPITAL_COMMUNITY): Payer: Self-pay

## 2021-02-18 ENCOUNTER — Other Ambulatory Visit (HOSPITAL_COMMUNITY): Payer: Self-pay

## 2021-02-20 ENCOUNTER — Inpatient Hospital Stay: Payer: 59 | Attending: Oncology

## 2021-02-20 ENCOUNTER — Inpatient Hospital Stay: Payer: 59

## 2021-02-20 VITALS — BP 114/64 | HR 76 | Temp 99.1°F

## 2021-02-20 DIAGNOSIS — C7951 Secondary malignant neoplasm of bone: Secondary | ICD-10-CM | POA: Insufficient documentation

## 2021-02-20 DIAGNOSIS — Z170 Estrogen receptor positive status [ER+]: Secondary | ICD-10-CM | POA: Insufficient documentation

## 2021-02-20 DIAGNOSIS — C50919 Malignant neoplasm of unspecified site of unspecified female breast: Secondary | ICD-10-CM

## 2021-02-20 DIAGNOSIS — C50911 Malignant neoplasm of unspecified site of right female breast: Secondary | ICD-10-CM | POA: Insufficient documentation

## 2021-02-20 LAB — CBC WITH DIFFERENTIAL/PLATELET
Abs Immature Granulocytes: 0.01 10*3/uL (ref 0.00–0.07)
Basophils Absolute: 0.1 10*3/uL (ref 0.0–0.1)
Basophils Relative: 2 %
Eosinophils Absolute: 0.1 10*3/uL (ref 0.0–0.5)
Eosinophils Relative: 2 %
HCT: 36.2 % (ref 36.0–46.0)
Hemoglobin: 11.8 g/dL — ABNORMAL LOW (ref 12.0–15.0)
Immature Granulocytes: 0 %
Lymphocytes Relative: 26 %
Lymphs Abs: 0.8 10*3/uL (ref 0.7–4.0)
MCH: 33.1 pg (ref 26.0–34.0)
MCHC: 32.6 g/dL (ref 30.0–36.0)
MCV: 101.7 fL — ABNORMAL HIGH (ref 80.0–100.0)
Monocytes Absolute: 0.4 10*3/uL (ref 0.1–1.0)
Monocytes Relative: 13 %
Neutro Abs: 1.7 10*3/uL (ref 1.7–7.7)
Neutrophils Relative %: 57 %
Platelets: 155 10*3/uL (ref 150–400)
RBC: 3.56 MIL/uL — ABNORMAL LOW (ref 3.87–5.11)
RDW: 16.8 % — ABNORMAL HIGH (ref 11.5–15.5)
Smear Review: ADEQUATE
WBC: 2.9 10*3/uL — ABNORMAL LOW (ref 4.0–10.5)
nRBC: 0 % (ref 0.0–0.2)

## 2021-02-20 LAB — COMPREHENSIVE METABOLIC PANEL
ALT: 20 U/L (ref 0–44)
AST: 24 U/L (ref 15–41)
Albumin: 3.9 g/dL (ref 3.5–5.0)
Alkaline Phosphatase: 77 U/L (ref 38–126)
Anion gap: 11 (ref 5–15)
BUN: 15 mg/dL (ref 6–20)
CO2: 23 mmol/L (ref 22–32)
Calcium: 9 mg/dL (ref 8.9–10.3)
Chloride: 103 mmol/L (ref 98–111)
Creatinine, Ser: 0.99 mg/dL (ref 0.44–1.00)
GFR, Estimated: >60 mL/min (ref >60)
Glucose, Bld: 137 mg/dL — ABNORMAL HIGH (ref 70–99)
Potassium: 4.5 mmol/L (ref 3.5–5.1)
Sodium: 137 mmol/L (ref 135–145)
Total Bilirubin: 0.7 mg/dL (ref 0.3–1.2)
Total Protein: 7 g/dL (ref 6.5–8.1)

## 2021-02-20 MED ORDER — SODIUM CHLORIDE 0.9 % IV SOLN
INTRAVENOUS | Status: DC
  Filled 2021-02-20: qty 250

## 2021-02-20 MED ORDER — ZOLEDRONIC ACID 4 MG/100ML IV SOLN
4.0000 mg | INTRAVENOUS | Status: DC
  Administered 2021-02-20: 4 mg via INTRAVENOUS
  Filled 2021-02-20: qty 100

## 2021-02-20 NOTE — Progress Notes (Signed)
Patient tolerated  Zometa infusion well today, no concerns voiced. Patient discharged. Stable.

## 2021-03-03 ENCOUNTER — Other Ambulatory Visit (HOSPITAL_COMMUNITY): Payer: Self-pay

## 2021-03-04 ENCOUNTER — Telehealth: Payer: Self-pay | Admitting: *Deleted

## 2021-03-04 ENCOUNTER — Other Ambulatory Visit (HOSPITAL_COMMUNITY): Payer: Self-pay

## 2021-03-04 ENCOUNTER — Encounter: Payer: Self-pay | Admitting: *Deleted

## 2021-03-04 NOTE — Telephone Encounter (Signed)
Patient called to report that she is having increased tiredness and dyspnea on exertion. She stated the these episodes are increasing.

## 2021-03-05 ENCOUNTER — Other Ambulatory Visit: Payer: Self-pay | Admitting: Pharmacist

## 2021-03-05 ENCOUNTER — Encounter: Payer: Self-pay | Admitting: *Deleted

## 2021-03-05 ENCOUNTER — Other Ambulatory Visit (HOSPITAL_COMMUNITY): Payer: Self-pay

## 2021-03-05 DIAGNOSIS — C50919 Malignant neoplasm of unspecified site of unspecified female breast: Secondary | ICD-10-CM

## 2021-03-05 MED ORDER — PALBOCICLIB 75 MG PO TABS
75.0000 mg | ORAL_TABLET | Freq: Every day | ORAL | 1 refills | Status: DC
  Filled 2021-03-05 – 2021-03-07 (×3): qty 21, 21d supply, fill #0
  Filled 2021-03-23: qty 21, 21d supply, fill #1

## 2021-03-06 ENCOUNTER — Encounter: Payer: Self-pay | Admitting: Oncology

## 2021-03-06 ENCOUNTER — Other Ambulatory Visit (HOSPITAL_COMMUNITY): Payer: Self-pay

## 2021-03-06 NOTE — Telephone Encounter (Signed)
I have connected with her on my chart

## 2021-03-07 ENCOUNTER — Other Ambulatory Visit (HOSPITAL_COMMUNITY): Payer: Self-pay

## 2021-03-12 ENCOUNTER — Other Ambulatory Visit (HOSPITAL_COMMUNITY): Payer: Self-pay

## 2021-03-12 DIAGNOSIS — K7460 Unspecified cirrhosis of liver: Secondary | ICD-10-CM | POA: Diagnosis not present

## 2021-03-12 DIAGNOSIS — K219 Gastro-esophageal reflux disease without esophagitis: Secondary | ICD-10-CM | POA: Diagnosis not present

## 2021-03-17 ENCOUNTER — Other Ambulatory Visit: Payer: Self-pay

## 2021-03-17 ENCOUNTER — Ambulatory Visit
Admission: RE | Admit: 2021-03-17 | Discharge: 2021-03-17 | Disposition: A | Discharge status: Home / Self Care | Payer: 59 | Source: Ambulatory Visit | Attending: Oncology | Admitting: Oncology

## 2021-03-17 ENCOUNTER — Encounter
Admission: RE | Admit: 2021-03-17 | Discharge: 2021-03-17 | Disposition: A | Discharge status: Home / Self Care | Payer: 59 | Source: Ambulatory Visit | Attending: Oncology | Admitting: Oncology

## 2021-03-17 ENCOUNTER — Other Ambulatory Visit: Payer: Self-pay | Admitting: *Deleted

## 2021-03-17 DIAGNOSIS — D1771 Benign lipomatous neoplasm of kidney: Secondary | ICD-10-CM | POA: Diagnosis not present

## 2021-03-17 DIAGNOSIS — C50919 Malignant neoplasm of unspecified site of unspecified female breast: Secondary | ICD-10-CM

## 2021-03-17 DIAGNOSIS — R911 Solitary pulmonary nodule: Secondary | ICD-10-CM | POA: Diagnosis not present

## 2021-03-17 DIAGNOSIS — Z9049 Acquired absence of other specified parts of digestive tract: Secondary | ICD-10-CM | POA: Diagnosis not present

## 2021-03-17 DIAGNOSIS — C7951 Secondary malignant neoplasm of bone: Secondary | ICD-10-CM | POA: Diagnosis not present

## 2021-03-17 DIAGNOSIS — Z853 Personal history of malignant neoplasm of breast: Secondary | ICD-10-CM | POA: Diagnosis not present

## 2021-03-17 DIAGNOSIS — C50911 Malignant neoplasm of unspecified site of right female breast: Secondary | ICD-10-CM | POA: Diagnosis not present

## 2021-03-17 MED ORDER — IOHEXOL 350 MG/ML SOLN
75.0000 mL | Freq: Once | INTRAVENOUS | Status: AC | PRN
  Administered 2021-03-17: 75 mL via INTRAVENOUS

## 2021-03-17 MED ORDER — TECHNETIUM TC 99M MEDRONATE IV KIT
20.0000 | PACK | Freq: Once | INTRAVENOUS | Status: AC | PRN
  Administered 2021-03-17: 21.57 via INTRAVENOUS

## 2021-03-20 ENCOUNTER — Other Ambulatory Visit: Payer: Self-pay

## 2021-03-20 ENCOUNTER — Inpatient Hospital Stay: Payer: 59

## 2021-03-20 ENCOUNTER — Inpatient Hospital Stay (HOSPITAL_BASED_OUTPATIENT_CLINIC_OR_DEPARTMENT_OTHER): Payer: 59 | Admitting: Oncology

## 2021-03-20 ENCOUNTER — Inpatient Hospital Stay: Payer: 59 | Attending: Oncology

## 2021-03-20 ENCOUNTER — Encounter: Payer: Self-pay | Admitting: Oncology

## 2021-03-20 VITALS — BP 123/71 | HR 80 | Temp 97.8°F | Resp 20 | Wt 266.1 lb

## 2021-03-20 DIAGNOSIS — C7951 Secondary malignant neoplasm of bone: Secondary | ICD-10-CM | POA: Insufficient documentation

## 2021-03-20 DIAGNOSIS — Z79810 Long term (current) use of selective estrogen receptor modulators (SERMs): Secondary | ICD-10-CM | POA: Insufficient documentation

## 2021-03-20 DIAGNOSIS — C50911 Malignant neoplasm of unspecified site of right female breast: Secondary | ICD-10-CM | POA: Diagnosis not present

## 2021-03-20 DIAGNOSIS — Z170 Estrogen receptor positive status [ER+]: Secondary | ICD-10-CM | POA: Insufficient documentation

## 2021-03-20 DIAGNOSIS — C50919 Malignant neoplasm of unspecified site of unspecified female breast: Secondary | ICD-10-CM

## 2021-03-20 DIAGNOSIS — D702 Other drug-induced agranulocytosis: Secondary | ICD-10-CM

## 2021-03-20 DIAGNOSIS — Z79899 Other long term (current) drug therapy: Secondary | ICD-10-CM | POA: Diagnosis not present

## 2021-03-20 LAB — CBC WITH DIFFERENTIAL/PLATELET
Abs Immature Granulocytes: 0.01 10*3/uL (ref 0.00–0.07)
Basophils Absolute: 0 10*3/uL (ref 0.0–0.1)
Basophils Relative: 1 %
Eosinophils Absolute: 0.1 10*3/uL (ref 0.0–0.5)
Eosinophils Relative: 3 %
HCT: 35.9 % — ABNORMAL LOW (ref 36.0–46.0)
Hemoglobin: 12 g/dL (ref 12.0–15.0)
Immature Granulocytes: 0 %
Lymphocytes Relative: 28 %
Lymphs Abs: 0.8 10*3/uL (ref 0.7–4.0)
MCH: 33.3 pg (ref 26.0–34.0)
MCHC: 33.4 g/dL (ref 30.0–36.0)
MCV: 99.7 fL (ref 80.0–100.0)
Monocytes Absolute: 0.4 10*3/uL (ref 0.1–1.0)
Monocytes Relative: 12 %
Neutro Abs: 1.6 10*3/uL — ABNORMAL LOW (ref 1.7–7.7)
Neutrophils Relative %: 56 %
Platelets: 168 10*3/uL (ref 150–400)
RBC: 3.6 MIL/uL — ABNORMAL LOW (ref 3.87–5.11)
RDW: 16.2 % — ABNORMAL HIGH (ref 11.5–15.5)
WBC: 2.8 10*3/uL — ABNORMAL LOW (ref 4.0–10.5)
nRBC: 0 % (ref 0.0–0.2)

## 2021-03-20 LAB — COMPREHENSIVE METABOLIC PANEL
ALT: 26 U/L (ref 0–44)
AST: 25 U/L (ref 15–41)
Albumin: 4 g/dL (ref 3.5–5.0)
Alkaline Phosphatase: 81 U/L (ref 38–126)
Anion gap: 9 (ref 5–15)
BUN: 16 mg/dL (ref 6–20)
CO2: 23 mmol/L (ref 22–32)
Calcium: 9.3 mg/dL (ref 8.9–10.3)
Chloride: 106 mmol/L (ref 98–111)
Creatinine, Ser: 0.9 mg/dL (ref 0.44–1.00)
GFR, Estimated: >60 mL/min (ref >60)
Glucose, Bld: 104 mg/dL — ABNORMAL HIGH (ref 70–99)
Potassium: 4.5 mmol/L (ref 3.5–5.1)
Sodium: 138 mmol/L (ref 135–145)
Total Bilirubin: 0.6 mg/dL (ref 0.3–1.2)
Total Protein: 7.4 g/dL (ref 6.5–8.1)

## 2021-03-20 MED ORDER — ZOLEDRONIC ACID 4 MG/100ML IV SOLN
4.0000 mg | INTRAVENOUS | Status: DC
  Administered 2021-03-20: 4 mg via INTRAVENOUS
  Filled 2021-03-20: qty 100

## 2021-03-20 MED ORDER — SODIUM CHLORIDE 0.9 % IV SOLN
INTRAVENOUS | Status: DC
  Filled 2021-03-20: qty 250

## 2021-03-20 NOTE — Patient Instructions (Signed)

## 2021-03-20 NOTE — Progress Notes (Signed)
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  It was a 1.4 cm tumor with negative lymph nodes grade 1 and patient went on to get lumpectomy followed by adjuvant radiation therapy.  Oncotype DX score was 6 and she did not require adjuvant chemotherapy.  She was on tamoxifen for 5 years.  She had BRCA testing done at that time which was negative.   Patient felt a lump in her right axilla in September 2021.  She underwent ultrasound of bilateral breasts which did not pick up any axillary mass.  A prior screening mammogram in September 2021 was unremarkable.  She was then admitted for Covid pneumonia and underwent CT angio chest on 07/22/2020 which incidentally picked up an axillary mass measuring 2.5 cm.  No obvious breast mass.  Axillary mass was biopsied and was consistent with high-grade invasive mammary carcinoma.  IHC was positive for GATA3 with diffuse strong nuclear staining.  There was no lymph node architecture identified in the sample and it could not be determined if it is a primary tumor within the axillary breast tissue or metastases.  ER strongly positive greater than 90%, PR 1 to 10% positive and HER-2 negative   PET CT scan showed hypermetabolic poorly marginated solid right axillary mass 2.5 x 2 cm with an SUV of 5.6.  No enlarged hypermetabolic mediastinal or hilar lymph  nodes no enlarged left axillary lymph nodes.  Subcentimeter lung nodules below PET resolution.  Small hypermetabolism in the right liver with an SUV of 6 without CT correlate equivocal for liver metastases.  Multiple hypermetabolic faintly lytic osseous lesions throughout the lumbar spine and bilateral pelvic girdle with representative supra-acetabular right iliac bone 1.9 cm lesion, anterior superior left acetabular 1.7 cm lesion and L1 1.2 cm lesion.   NGS testing showed no actionable mutations.  PD-L1 less than 1%.  CHEK2 S422 FS, K3 CA and 1068 FS, PICC 3 CA and 345H, CCN E1 gain, ERB B2 1 P-CSF 1 hour fusion, FGF 19 gain, FGF 3 gain, FGF 4gain Ibrance plus letrozole started in February 2022.  Baseline tumor markers not elevated      Interval history-left hip pain presently stable with pain meds.  She has days when she feels more emotionally overwhelmed and slowly gets over it.  She will be starting the lower dose of Ibrance at 75 mg tomorrow.  She reports fatigue and exertional shortness of breath  ECOG PS- 1 Pain scale- 2 Opioid associated constipation- no  Review of systems- Review of Systems  Constitutional:  Positive for malaise/fatigue. Negative for chills, fever and weight loss.  HENT:  Negative for congestion, ear discharge and nosebleeds.   Eyes:  Negative for blurred vision.  Respiratory:  Positive for shortness of breath. Negative for cough, hemoptysis, sputum production and wheezing.   Cardiovascular:  Negative for chest pain, palpitations, orthopnea and claudication.  Gastrointestinal:  Negative for abdominal pain, blood in stool, constipation,  diarrhea, heartburn, melena, nausea and vomiting.  Genitourinary:  Negative for dysuria, flank pain, frequency, hematuria and urgency.  Musculoskeletal:  Negative for back pain, joint pain and myalgias.  Skin:  Negative for rash.  Neurological:  Negative for dizziness, tingling, focal weakness, seizures, weakness and headaches.   Endo/Heme/Allergies:  Does not bruise/bleed easily.  Psychiatric/Behavioral:  Negative for depression and suicidal ideas. The patient does not have insomnia.      Allergies  Allergen Reactions   Kiwi Extract Itching and Swelling    The real kiwi fruit   Codeine Other (See Comments)    Felt funny.   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   Chicken pox    Depression    Fatty liver    Fatty liver    Gastric polyps    GERD (gastroesophageal reflux disease)    Gestational diabetes    prediabetes out side of having baby   Headache    migraines   Heart murmur    History of abnormal mammogram 08/2006, 01/2008, 03/2009   Hypertension    IBS (irritable bowel syndrome)    Joint disorder    hx of left ankle tendonitis, bursitis, heel spur   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 Smith/ laprascopic surgery   COLONOSCOPY  04/04/2000   COLONOSCOPY WITH PROPOFOL N/A 02/12/2019   Procedure: COLONOSCOPY WITH PROPOFOL;  Surgeon: Lollie Sails, MD;  Location: [facility] ENDOSCOPY;  Service: Endoscopy;  Laterality: N/A;   ESOPHAGOGASTRODUODENOSCOPY (EGD) WITH PROPOFOL N/A 05/01/2015   Procedure: ESOPHAGOGASTRODUODENOSCOPY (EGD) WITH PROPOFOL;  Surgeon: Hulen Luster, MD;  Location: [facility] ENDOSCOPY;  Service: Gastroenterology;  Laterality: N/A;   ESOPHAGOGASTRODUODENOSCOPY (EGD) WITH PROPOFOL N/A 02/12/2019   Procedure: ESOPHAGOGASTRODUODENOSCOPY (EGD) WITH PROPOFOL;  Surgeon: Lollie Sails, MD;  Location: [facility] ENDOSCOPY;  Service: Endoscopy;  Laterality: N/A;   EYE SURGERY  08/2012   laser surgery on retina/ DR Appenzeller   FINGER SURGERY     repair of tendon in right index, Dr. Francesco Sor in  [place]   FOOT SURGERY     Dr. [place]ik CYST     right upper back   LAPAROSCOPIC SUPRACERVICAL HYSTERECTOMY  06/2007   Dr Kincius/ adhesions/CPP/adenomyosis   MASTECTOMY PARTIAL / LUMPECTOMY Right 01/2008   with sentinal lymph node biopsy/ infiltrating ductal cancer   REPAIR PERONEAL TENDONS ANKLE  10/2019   with tarsal exostectomy and sural neuroma excision on right    SKIN CANCER EXCISION     back and calves   WISDOM TOOTH EXTRACTION  1991    Social History   Socioeconomic History   Marital status: Married    Spouse name: Richardson Landry   Number of children: 1   Years of education: 14   Highest education level: Not on file  Occupational History   Occupation: CMA  Tobacco Use   Smoking status: Never   Smokeless tobacco: Never  Vaping Use   Vaping Use: Never used  Substance and Sexual Activity   Alcohol use: Yes    Alcohol/week: 0.0 standard drinks    Comment: rare, 1-2 drinks per year   Drug use: No   Sexual activity:  Yes    Partners: Male    Birth control/protection: Surgical    Comment: Hysterectomy   Other Topics Concern   Not on file  Social History Narrative   Lives in [place] with husband, no pets.  Works at [employer].      Diet - regular   Exercise - occasional   Social Determinants of Health   Financial Resource Strain: Not on file  Food Insecurity: Not on file  Transportation Needs: Not on file  Physical Activity: Not on file  Stress: Not on file  Social Connections: Not on file  Intimate Partner Violence: Not on file    Family History  Problem Relation Age of Onset   Hypertension Mother    Thyroid disease Mother    Breast cancer Mother 14   Colon cancer Mother 84       again at 19   Atrial fibrillation Mother    Hip fracture Mother    Skin cancer Mother    Stroke Father    Hypertension Father    Cancer Father 2       prostate   Parkinson's disease Father    Skin cancer Father    Hypertension Sister    Thyroid disease Sister    Cancer Sister        skin/ squamous cell   Lung cancer Paternal Aunt 80       smoker   Diabetes Maternal Grandmother    Stroke Maternal Grandfather    Heart disease Paternal Grandfather    Diabetes Maternal Aunt    Lymphoma Maternal Uncle    Thyroid cancer Cousin        maternal   Heart disease Maternal Uncle    Cancer Maternal Aunt    Breast cancer Maternal Aunt 75   Breast cancer Cousin 60       maternal   Breast cancer Cousin 13   Colon cancer Cousin      Current Outpatient Medications:    acetaminophen (TYLENOL) 325 MG tablet, Take 650 mg by mouth every 6 (six) hours as needed for mild pain or headache., Disp: , Rfl:    buPROPion (WELLBUTRIN XL) 300 MG 24 hr tablet, TAKE 1 TABLET BY MOUTH ONCE DAILY, Disp: 90 tablet, Rfl: 0   citalopram (CELEXA) 40 MG tablet, Take 1 tablet (40 mg total)  by mouth daily., Disp: 90 tablet, Rfl: 0   clotrimazole (LOTRIMIN) 1 % cream, Apply 1 application topically daily. groin, Disp: , Rfl:     clotrimazole-betamethasone (LOTRISONE) cream, APPLY EXTERNALLY TWICE A DAY FOR 2 WEEKS, Disp: 45 g, Rfl: 0   COVID-19 At Home Antigen Test KIT, USE AS DIRECTED WITHIN PACKAGE INSTRUCTIONS, Disp: 4 kit, Rfl: 0   COVID-19 mRNA vaccine, Pfizer, 30 MCG/0.3ML injection, USE AS DIRECTED, Disp: .3 mL, Rfl: 0   diltiazem (CARDIZEM CD) 120 MG 24 hr capsule, TAKE 1 CAPSULE BY MOUTH DAILY., Disp: 30 capsule, Rfl: 10   diphenhydrAMINE (BENADRYL) 25 MG tablet, Take 25 mg by mouth at bedtime as needed for sleep., Disp: , Rfl:    gabapentin (NEURONTIN) 100 MG capsule, Take 1 capsule (100 mg total) by mouth 3 (three) times daily, Disp: 270 capsule, Rfl: 1   ketotifen (ZADITOR) 0.025 % ophthalmic solution, Place 1 drop into both eyes daily., Disp: , Rfl:    letrozole (FEMARA) 2.5 MG tablet, TAKE 1 TABLET (2.5 MG TOTAL) BY MOUTH DAILY., Disp: 90 tablet, Rfl: 4   lisinopril (ZESTRIL) 10 MG tablet, TAKE 1 TABLET BY MOUTH ONCE DAILY, Disp: 90 tablet, Rfl: 1   metFORMIN (GLUCOPHAGE-XR) 500 MG 24 hr tablet, TAKE 1 TABLET BY MOUTH TWICE DAILY WITH MEALS, Disp: 180 tablet, Rfl: 1   Multiple Vitamin (MULTIVITAMIN) tablet, Take 1 tablet by mouth daily., Disp: , Rfl:    omeprazole (PRILOSEC) 20 MG capsule, TAKE 1 CAPSULE BY MOUTH 2 TIMES DAILY TAKE 30 MIN BEFORE MEALS., Disp: 180 capsule, Rfl: 1   ondansetron (ZOFRAN) 4 MG tablet, Take 1 tablet (4 mg total) by mouth every 8 (eight) hours as needed for nausea or vomiting., Disp: 30 tablet, Rfl: 0   palbociclib (IBRANCE) 75 MG tablet, Take 1 tablet (75 mg total) by mouth daily. Take for 21 days on, 7 days off, repeat every 28 days., Disp: 21 tablet, Rfl: 1 No current facility-administered medications for this visit.  Facility-Administered Medications Ordered in Other Visits:    0.9 %  sodium chloride infusion, , Intravenous, Continuous, Sindy Guadeloupe, MD, Stopped at 03/20/21 1111   Zoledronic Acid (ZOMETA) IVPB 4 mg, 4 mg, Intravenous, Q28 days, Sindy Guadeloupe, MD, Stopped at  03/20/21 1103  Physical exam:  Vitals:   03/20/21 0943  BP: 123/71  Pulse: 80  Resp: 20  Temp: 97.8 F (36.6 C)  SpO2: 100%  Weight: 266 lb 1.6 oz (120.7 kg)   Physical Exam Cardiovascular:     Rate and Rhythm: Normal rate and regular rhythm.     Heart sounds: Normal heart sounds.  Pulmonary:     Effort: Pulmonary effort is normal.     Breath sounds: Normal breath sounds.  Abdominal:     General: Bowel sounds are normal.     Palpations: Abdomen is soft.  Skin:    General: Skin is warm and dry.  Neurological:     Mental Status: She is alert and oriented to person, place, and time.     CMP Latest Ref Rng & Units 03/20/2021  Glucose 70 - 99 mg/dL 104(H)  BUN 6 - 20 mg/dL 16  Creatinine 0.44 - 1.00 mg/dL 0.90  Sodium 135 - 145 mmol/L 138  Potassium 3.5 - 5.1 mmol/L 4.5  Chloride 98 - 111 mmol/L 106  CO2 22 - 32 mmol/L 23  Calcium 8.9 - 10.3 mg/dL 9.3  Total Protein 6.5 - 8.1 g/dL 7.4  Total Bilirubin 0.3 - 1.2 mg/dL  0.6  Alkaline Phos 38 - 126 U/L 81  AST 15 - 41 U/L 25  ALT 0 - 44 U/L 26   CBC Latest Ref Rng & Units 03/20/2021  WBC 4.0 - 10.5 K/uL 2.8(L)  Hemoglobin 12.0 - 15.0 g/dL 12.0  Hematocrit 36.0 - 46.0 % 35.9(L)  Platelets 150 - 400 K/uL 168    No images are attached to the encounter.  NM Bone Scan Whole Body  Result Date: 03/18/2021 CLINICAL DATA:  History of breast cancer. EXAM: NUCLEAR MEDICINE WHOLE BODY BONE SCAN TECHNIQUE: Whole body anterior and posterior images were obtained approximately 3 hours after intravenous injection of radiopharmaceutical. RADIOPHARMACEUTICALS:  21.6 mCi Technetium-15m MDP IV COMPARISON:  CT 03/17/2021.  Bone scan 11/27/2020. FINDINGS: Bilateral renal function and excretion. Diffuse increased activity noted over the left acetabulum and proximal left femur. This suggests the possibility of progressive metastatic disease. Subtle areas of increased activity noted over the thoracolumbar spine again noted without interim change and  again consistent with metastatic disease. IMPRESSION: 1. Diffuse increased activity noted over the left acetabulum and proximal left femur. This suggests the possibility of progressive metastatic disease. 2. Subtle areas of increased activity noted over the thoracolumbar spine again noted without interim change and again consistent with metastatic disease. Electronically Signed   By: Marcello Moores  Register M.D.   On: 03/18/2021 11:29   CT CHEST ABDOMEN PELVIS W CONTRAST  Result Date: 03/18/2021 CLINICAL DATA:  RIGHT side breast CA initially dx in 2009 and again in February of this year. Liver protocol to eval liver mets.^67mL OMNIPAQUE IOHEXOL 350 MG/ML SOLNliver protocol, breast cancer liver protocol, breast cancer EXAM: CT CHEST, ABDOMEN, AND PELVIS WITH CONTRAST TECHNIQUE: Multidetector CT imaging of the chest, abdomen and pelvis was performed following the standard protocol during bolus administration of intravenous contrast. CONTRAST:  23mL OMNIPAQUE IOHEXOL 350 MG/ML SOLN COMPARISON:  CT 11/27/2020 FINDINGS: CT CHEST FINDINGS Cardiovascular: No significant vascular findings. Normal heart size. No pericardial effusion. Mediastinum/Nodes: No axillary or supraclavicular adenopathy. No mediastinal or hilar adenopathy. No pericardial fluid. Esophagus normal. Lungs/Pleura: Small 5 mm nodule along the RIGHT oblique fissure is unchanged in size from 11/27/2020. No new pulmonary nodules. Musculoskeletal: Sclerotic lesion in the T6 vertebral body is unchanged. No new sclerotic lesions present. CT ABDOMEN AND PELVIS FINDINGS Hepatobiliary: No focal enhancing hepatic lesion arterial phase imaging venous phase imaging. Postcholecystectomy. Pancreas: Pancreas is normal. No ductal dilatation. No pancreatic inflammation. Spleen: Normal spleen Adrenals/urinary tract: Adrenal glands normal. 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 Ureters and bladder normal. Stomach/Bowel: Stomach, small bowel,  appendix, and cecum are normal. The colon and rectosigmoid colon are normal. Vascular/Lymphatic: Abdominal aorta is normal caliber. There is no retroperitoneal or periportal lymphadenopathy. No pelvic lymphadenopathy. Reproductive: Uterus and adnexa unremarkable. Other: No peritoneal metastasis Musculoskeletal: Sclerotic lesion in the L1 vertebral body unchanged. Lytic lesion in the LEFT femoral neck measuring 10 mm is unchanged. Lytic lesion in the anterior aspect of the LEFT acetabulum measuring 15 mm (image 115/3) is also unchanged. IMPRESSION: Chest Impression: 1. No evidence of thoracic metastasis. 2. Stable small pulmonary nodule. Abdomen / Pelvis Impression: 1. No evidence of liver metastasis. 2. No evidence of lymphadenopathy or visceral metastasis in the abdomen pelvis. 3. Stable mixed lytic and sclerotic skeletal metastasis in the spine and pelvis. Electronically Signed   By: Suzy Bouchard M.D.   On: 03/18/2021 08:48     Assessment and plan- Patient is a 60 y.o. female with metastatic ER positive  breast cancer with bone metastases here to discuss CT scan results and routine follow-up  I have reviewed bone scan images independently and discussed findings with the patient.  No new areas of bony metastatic disease.  There was increased activity noted over the left acetabulum and proximal left femur.  Suspect this could be seen in the setting of Zometa use as well as recent radiation treatment.  I am not inclined to change her treatment based on these findings and we will continue to monitor with repeat scans in 3 to 4 months time.  No other areas of progressive/residual disease.  Plan is to continue letrozole along with Ibrance until progression or toxicity.  Patient will continue to receive Zometa on a monthly basis.  After about 1 year we will switch her to Zometa every 3 months.  Fatigue/exertional shortness of breath: Suspect secondary to [place].  CT chest did not show any acute  pathology.  Patient has had supracervical hysterectomy but scans continue to report normal uterus and I will reach out to radiology again about this if this is cervix that they are reporting on scans   Visit Diagnosis 1. Metastatic breast cancer ([place])   2. High risk medication use   3. Drug-induced neutropenia ([place])      Dr. Randa Evens, MD, MPH [facility] [phone] 03/20/2021 12:53 PM

## 2021-03-23 ENCOUNTER — Other Ambulatory Visit (HOSPITAL_COMMUNITY): Payer: Self-pay

## 2021-03-24 ENCOUNTER — Other Ambulatory Visit (HOSPITAL_COMMUNITY): Payer: Self-pay

## 2021-03-24 MED FILL — Lisinopril Tab 10 MG: ORAL | 90 days supply | Qty: 90 | Fill #0 | Status: AC

## 2021-03-24 MED FILL — Letrozole Tab 2.5 MG: ORAL | 90 days supply | Qty: 90 | Fill #2 | Status: CN

## 2021-03-25 ENCOUNTER — Other Ambulatory Visit (HOSPITAL_COMMUNITY): Payer: Self-pay

## 2021-03-26 ENCOUNTER — Other Ambulatory Visit (HOSPITAL_COMMUNITY): Payer: Self-pay

## 2021-03-27 ENCOUNTER — Other Ambulatory Visit (HOSPITAL_COMMUNITY): Payer: Self-pay

## 2021-03-28 ENCOUNTER — Other Ambulatory Visit (HOSPITAL_COMMUNITY): Payer: Self-pay

## 2021-04-10 ENCOUNTER — Other Ambulatory Visit (HOSPITAL_COMMUNITY): Payer: Self-pay

## 2021-04-13 ENCOUNTER — Other Ambulatory Visit (HOSPITAL_COMMUNITY): Payer: Self-pay

## 2021-04-13 MED ORDER — CITALOPRAM HYDROBROMIDE 40 MG PO TABS
40.0000 mg | ORAL_TABLET | Freq: Every day | ORAL | 1 refills | Status: DC
  Filled 2021-04-13: qty 90, 90d supply, fill #0
  Filled 2021-07-11: qty 90, 90d supply, fill #1

## 2021-04-14 ENCOUNTER — Other Ambulatory Visit (HOSPITAL_COMMUNITY): Payer: Self-pay

## 2021-04-17 ENCOUNTER — Inpatient Hospital Stay: Payer: 59 | Attending: Oncology

## 2021-04-17 ENCOUNTER — Inpatient Hospital Stay: Payer: 59

## 2021-04-17 DIAGNOSIS — Z79811 Long term (current) use of aromatase inhibitors: Secondary | ICD-10-CM | POA: Insufficient documentation

## 2021-04-17 DIAGNOSIS — C7951 Secondary malignant neoplasm of bone: Secondary | ICD-10-CM | POA: Insufficient documentation

## 2021-04-17 DIAGNOSIS — C50911 Malignant neoplasm of unspecified site of right female breast: Secondary | ICD-10-CM | POA: Diagnosis not present

## 2021-04-17 DIAGNOSIS — C50919 Malignant neoplasm of unspecified site of unspecified female breast: Secondary | ICD-10-CM

## 2021-04-17 DIAGNOSIS — Z170 Estrogen receptor positive status [ER+]: Secondary | ICD-10-CM | POA: Insufficient documentation

## 2021-04-17 LAB — CBC WITH DIFFERENTIAL/PLATELET
Abs Immature Granulocytes: 0.02 10*3/uL (ref 0.00–0.07)
Basophils Absolute: 0.1 10*3/uL (ref 0.0–0.1)
Basophils Relative: 1 %
Eosinophils Absolute: 0.1 10*3/uL (ref 0.0–0.5)
Eosinophils Relative: 3 %
HCT: 34.2 % — ABNORMAL LOW (ref 36.0–46.0)
Hemoglobin: 11.3 g/dL — ABNORMAL LOW (ref 12.0–15.0)
Immature Granulocytes: 1 %
Lymphocytes Relative: 27 %
Lymphs Abs: 1 10*3/uL (ref 0.7–4.0)
MCH: 33 pg (ref 26.0–34.0)
MCHC: 33 g/dL (ref 30.0–36.0)
MCV: 100 fL (ref 80.0–100.0)
Monocytes Absolute: 0.4 10*3/uL (ref 0.1–1.0)
Monocytes Relative: 12 %
Neutro Abs: 2 10*3/uL (ref 1.7–7.7)
Neutrophils Relative %: 56 %
Platelets: 158 10*3/uL (ref 150–400)
RBC: 3.42 MIL/uL — ABNORMAL LOW (ref 3.87–5.11)
RDW: 15.6 % — ABNORMAL HIGH (ref 11.5–15.5)
WBC: 3.5 10*3/uL — ABNORMAL LOW (ref 4.0–10.5)
nRBC: 0 % (ref 0.0–0.2)

## 2021-04-17 LAB — COMPREHENSIVE METABOLIC PANEL
ALT: 20 U/L (ref 0–44)
AST: 20 U/L (ref 15–41)
Albumin: 3.9 g/dL (ref 3.5–5.0)
Alkaline Phosphatase: 78 U/L (ref 38–126)
Anion gap: 8 (ref 5–15)
BUN: 17 mg/dL (ref 6–20)
CO2: 23 mmol/L (ref 22–32)
Calcium: 8.5 mg/dL — ABNORMAL LOW (ref 8.9–10.3)
Chloride: 105 mmol/L (ref 98–111)
Creatinine, Ser: 0.89 mg/dL (ref 0.44–1.00)
GFR, Estimated: >60 mL/min (ref >60)
Glucose, Bld: 116 mg/dL — ABNORMAL HIGH (ref 70–99)
Potassium: 3.9 mmol/L (ref 3.5–5.1)
Sodium: 136 mmol/L (ref 135–145)
Total Bilirubin: 0.3 mg/dL (ref 0.3–1.2)
Total Protein: 7 g/dL (ref 6.5–8.1)

## 2021-04-17 NOTE — Progress Notes (Signed)
Labs reviewed with Dr Tasia Catchings. Per Dr Tasia Catchings to hold zometa infusion today and to keep appointments as scheduled. Treatment team updated. Pt updated and all questions answered at this time. Pt stable for discharge.  Jacqueline Deleon [employer]

## 2021-04-24 ENCOUNTER — Other Ambulatory Visit: Payer: Self-pay

## 2021-04-24 ENCOUNTER — Ambulatory Visit: Payer: 59 | Attending: Internal Medicine

## 2021-04-24 DIAGNOSIS — Z23 Encounter for immunization: Secondary | ICD-10-CM

## 2021-04-24 MED ORDER — PFIZER COVID-19 VAC BIVALENT 30 MCG/0.3ML IM SUSP
INTRAMUSCULAR | 0 refills | Status: DC
  Filled 2021-04-24: qty 0.3, 1d supply, fill #0

## 2021-04-24 NOTE — Progress Notes (Signed)
   Covid-19 Vaccination Clinic  Name:  ZENIYAH PEASTER    MRN: [id number] DOB: [birth year]  04/24/2021  Ms. Hickle was observed post Covid-19 immunization for 30 minutes based on pre-vaccination screening without incident. She was provided with Vaccine Information Sheet and instruction to access the V-Safe system.   Ms. Mckeel was instructed to call 911 with any severe reactions post vaccine: Difficulty breathing  Swelling of face and throat  A fast heartbeat  A bad rash all over body  Dizziness and weakness   Lu Duffel, PharmD, MBA Clinical Acute Care Pharmacist

## 2021-05-04 ENCOUNTER — Other Ambulatory Visit (HOSPITAL_COMMUNITY): Payer: Self-pay

## 2021-05-04 MED FILL — Diltiazem HCl Coated Beads Cap ER 24HR 120 MG: ORAL | 90 days supply | Qty: 90 | Fill #3 | Status: AC

## 2021-05-04 MED FILL — Metformin HCl Tab ER 24HR 500 MG: ORAL | 90 days supply | Qty: 180 | Fill #0 | Status: AC

## 2021-05-05 ENCOUNTER — Other Ambulatory Visit (HOSPITAL_COMMUNITY): Payer: Self-pay

## 2021-05-05 ENCOUNTER — Other Ambulatory Visit: Payer: Self-pay | Admitting: Oncology

## 2021-05-05 DIAGNOSIS — C50919 Malignant neoplasm of unspecified site of unspecified female breast: Secondary | ICD-10-CM

## 2021-05-05 MED ORDER — PALBOCICLIB 75 MG PO TABS
75.0000 mg | ORAL_TABLET | Freq: Every day | ORAL | 1 refills | Status: DC
Start: 2021-05-05 — End: 2021-07-07
  Filled 2021-05-13: qty 21, 28d supply, fill #0
  Filled 2021-06-03 – 2021-06-05 (×4): qty 21, 28d supply, fill #1

## 2021-05-11 ENCOUNTER — Other Ambulatory Visit (HOSPITAL_COMMUNITY): Payer: Self-pay

## 2021-05-11 MED FILL — Letrozole Tab 2.5 MG: ORAL | 90 days supply | Qty: 90 | Fill #2 | Status: AC

## 2021-05-13 ENCOUNTER — Other Ambulatory Visit (HOSPITAL_COMMUNITY): Payer: Self-pay

## 2021-05-15 ENCOUNTER — Inpatient Hospital Stay: Payer: 59

## 2021-05-15 ENCOUNTER — Inpatient Hospital Stay: Payer: 59 | Attending: Oncology

## 2021-05-15 ENCOUNTER — Other Ambulatory Visit: Payer: Self-pay

## 2021-05-15 ENCOUNTER — Encounter: Payer: Self-pay | Admitting: Oncology

## 2021-05-15 ENCOUNTER — Inpatient Hospital Stay (HOSPITAL_BASED_OUTPATIENT_CLINIC_OR_DEPARTMENT_OTHER): Payer: 59 | Admitting: Oncology

## 2021-05-15 DIAGNOSIS — C50919 Malignant neoplasm of unspecified site of unspecified female breast: Secondary | ICD-10-CM

## 2021-05-15 DIAGNOSIS — C7951 Secondary malignant neoplasm of bone: Secondary | ICD-10-CM | POA: Diagnosis not present

## 2021-05-15 DIAGNOSIS — Z7983 Long term (current) use of bisphosphonates: Secondary | ICD-10-CM | POA: Diagnosis not present

## 2021-05-15 DIAGNOSIS — Z170 Estrogen receptor positive status [ER+]: Secondary | ICD-10-CM | POA: Diagnosis not present

## 2021-05-15 DIAGNOSIS — Z79899 Other long term (current) drug therapy: Secondary | ICD-10-CM | POA: Diagnosis not present

## 2021-05-15 DIAGNOSIS — Z5181 Encounter for therapeutic drug level monitoring: Secondary | ICD-10-CM

## 2021-05-15 DIAGNOSIS — C50911 Malignant neoplasm of unspecified site of right female breast: Secondary | ICD-10-CM | POA: Insufficient documentation

## 2021-05-15 LAB — COMPREHENSIVE METABOLIC PANEL
ALT: 23 U/L (ref 0–44)
AST: 22 U/L (ref 15–41)
Albumin: 4.1 g/dL (ref 3.5–5.0)
Alkaline Phosphatase: 91 U/L (ref 38–126)
Anion gap: 12 (ref 5–15)
BUN: 19 mg/dL (ref 6–20)
CO2: 22 mmol/L (ref 22–32)
Calcium: 9 mg/dL (ref 8.9–10.3)
Chloride: 102 mmol/L (ref 98–111)
Creatinine, Ser: 1.02 mg/dL — ABNORMAL HIGH (ref 0.44–1.00)
GFR, Estimated: >60 mL/min (ref >60)
Glucose, Bld: 106 mg/dL — ABNORMAL HIGH (ref 70–99)
Potassium: 4.5 mmol/L (ref 3.5–5.1)
Sodium: 136 mmol/L (ref 135–145)
Total Bilirubin: 0.4 mg/dL (ref 0.3–1.2)
Total Protein: 7.3 g/dL (ref 6.5–8.1)

## 2021-05-15 LAB — CBC WITH DIFFERENTIAL/PLATELET
Abs Immature Granulocytes: 0.02 10*3/uL (ref 0.00–0.07)
Basophils Absolute: 0 10*3/uL (ref 0.0–0.1)
Basophils Relative: 1 %
Eosinophils Absolute: 0.1 10*3/uL (ref 0.0–0.5)
Eosinophils Relative: 2 %
HCT: 37.4 % (ref 36.0–46.0)
Hemoglobin: 12.2 g/dL (ref 12.0–15.0)
Immature Granulocytes: 1 %
Lymphocytes Relative: 27 %
Lymphs Abs: 0.8 10*3/uL (ref 0.7–4.0)
MCH: 32.5 pg (ref 26.0–34.0)
MCHC: 32.6 g/dL (ref 30.0–36.0)
MCV: 99.7 fL (ref 80.0–100.0)
Monocytes Absolute: 0.5 10*3/uL (ref 0.1–1.0)
Monocytes Relative: 15 %
Neutro Abs: 1.7 10*3/uL (ref 1.7–7.7)
Neutrophils Relative %: 54 %
Platelets: 156 10*3/uL (ref 150–400)
RBC: 3.75 MIL/uL — ABNORMAL LOW (ref 3.87–5.11)
RDW: 15.8 % — ABNORMAL HIGH (ref 11.5–15.5)
WBC: 3.1 10*3/uL — ABNORMAL LOW (ref 4.0–10.5)
nRBC: 0 % (ref 0.0–0.2)

## 2021-05-15 MED ORDER — SODIUM CHLORIDE 0.9 % IV SOLN
INTRAVENOUS | Status: DC
  Filled 2021-05-15: qty 250

## 2021-05-15 MED ORDER — ZOLEDRONIC ACID 4 MG/100ML IV SOLN
4.0000 mg | INTRAVENOUS | Status: DC
  Administered 2021-05-15: 4 mg via INTRAVENOUS
  Filled 2021-05-15: qty 100

## 2021-05-15 NOTE — Progress Notes (Signed)
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  To receive Zometa  Heme/Onc history: Patient is a 60 year old female with a past medical history significant for stage I right breast cancer diagnosed in 2009.  It was a 1.4 cm tumor with negative lymph nodes grade 1 and patient went on to get lumpectomy followed by adjuvant radiation therapy.  Oncotype DX score was 6 and she did not require adjuvant chemotherapy.  She was on tamoxifen for 5 years.  She had BRCA testing done at that time which was negative.   Patient felt a lump in her right axilla in September 2021.  She underwent ultrasound of bilateral breasts which did not pick up any axillary mass.  A prior screening mammogram in September 2021 was unremarkable.  She was then admitted for Covid pneumonia and underwent CT angio chest on 07/22/2020 which incidentally picked up an axillary mass measuring 2.5 cm.  No obvious breast mass.  Axillary mass was biopsied and was consistent with high-grade invasive mammary carcinoma.  IHC was positive for GATA3 with diffuse strong nuclear staining.  There was no lymph node architecture identified in the sample and it could not be determined if it is a primary tumor within the axillary breast tissue or metastases.  ER strongly positive greater than 90%, PR 1 to 10% positive and HER-2 negative   PET CT scan showed hypermetabolic poorly marginated solid right axillary mass 2.5 x 2 cm with an SUV of 5.6.  No enlarged hypermetabolic mediastinal or hilar lymph  nodes no enlarged left axillary lymph nodes.  Subcentimeter lung nodules below PET resolution.  Small hypermetabolism in the right liver with an SUV of 6 without CT correlate equivocal for liver metastases.  Multiple hypermetabolic faintly lytic osseous lesions throughout the lumbar spine and bilateral pelvic girdle with representative supra-acetabular right iliac bone 1.9 cm lesion, anterior superior left acetabular 1.7 cm lesion and L1 1.2 cm lesion.   NGS testing showed no actionable mutations.  PD-L1 less than 1%.  CHEK2 S422 FS, K3 CA and 1068 FS, PICC 3 CA and 345H, CCN E1 gain, ERB B2 1 P-CSF 1 hour fusion, FGF 19 gain, FGF 3 gain, FGF 4gain Ibrance plus letrozole started in February 2022.  Baseline tumor markers not elevated     Interval history-left hip pain is stable and she is not using much of her pain medicine.  She has baseline fatigue secondary to [facility] which is overall stable.  Denies any new complaints at this time  ECOG PS- 1 Pain scale- 0   Review of systems- Review of Systems  Constitutional:  Positive for malaise/fatigue. Negative for chills, fever and weight loss.  HENT:  Negative for congestion, ear discharge and nosebleeds.   Eyes:  Negative for blurred vision.  Respiratory:  Negative for cough, hemoptysis, sputum production, shortness of breath and wheezing.   Cardiovascular:  Negative for chest pain, palpitations, orthopnea and claudication.  Gastrointestinal:  Negative for abdominal pain, blood in stool, constipation, diarrhea, heartburn, melena, nausea and vomiting.  Genitourinary:  Negative for dysuria, flank pain, frequency, hematuria and  urgency.  Musculoskeletal:  Negative for back pain, joint pain and myalgias.  Skin:  Negative for rash.  Neurological:  Negative for dizziness, tingling, focal weakness, seizures, weakness and headaches.  Endo/Heme/Allergies:  Does not bruise/bleed easily.  Psychiatric/Behavioral:  Negative for depression and suicidal ideas.  The patient does not have insomnia.       Allergies  Allergen Reactions   Kiwi Extract Itching and Swelling    The real kiwi fruit   Clorox Nasal Antiseptic [Povidone-Iodine] Other (See Comments)    Throat stricture when using it    Codeine Other (See Comments)    Felt funny.   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   left lower leg   Central serous retinopathy    CHEK2-related breast cancer ([place]) 07/2020   Chicken pox    Depression    Fatty liver    Fatty liver    Gastric polyps    GERD (gastroesophageal reflux disease)    Gestational diabetes    prediabetes out side of having baby   Headache    migraines   Heart murmur    History of abnormal mammogram 08/2006, 01/2008, 03/2009   Hypertension    IBS (irritable bowel syndrome)    Joint disorder    hx of left ankle tendonitis, bursitis, heel spur   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 Smith/ laprascopic surgery   COLONOSCOPY  04/04/2000   COLONOSCOPY WITH PROPOFOL N/A 02/12/2019   Procedure: COLONOSCOPY WITH PROPOFOL;  Surgeon: Lollie Sails, MD;  Location[facility] ENDOSCOPY;  Service: Endoscopy;  Laterality: N/A;   ESOPHAGOGASTRODUODENOSCOPY (EGD) WITH PROPOFOL N/A 05/01/2015   Procedure: ESOPHAGOGASTRODUODENOSCOPY (EGD) WITH PROPOFOL;  Surgeon: Hulen Luster, MD;  Location: [facility] ENDOSCOPY;  Service: Gastroenterology;  Laterality: N/A;   ESOPHAGOGASTRODUODENOSCOPY (EGD) WITH PROPOFOL N/A 02/12/2019   Procedure: ESOPHAGOGASTRODUODENOSCOPY (EGD) WITH PROPOFOL;  Surgeon: Lollie Sails, MD;  Location: [facility] ENDOSCOPY;  Service: Endoscopy;  Laterality: N/A;   EYE SURGERY  08/2012   laser surgery on retina/ DR Appenzeller   FINGER SURGERY     repair of tendon in right index, Dr.  Boatright in [place]   FOOT SURGERY     Dr. Milinda Pointer   IRRIGATION AND DEBRIDEMENT SEBACEOUS CYST     right upper back   LAPAROSCOPIC SUPRACERVICAL HYSTERECTOMY  06/2007   Dr Kincius/ adhesions/CPP/adenomyosis   MASTECTOMY PARTIAL / LUMPECTOMY Right 01/2008   with sentinal lymph node biopsy/ infiltrating ductal cancer   REPAIR PERONEAL TENDONS ANKLE  10/2019   with tarsal exostectomy and sural neuroma excision on right    SKIN CANCER EXCISION     back and calves   WISDOM TOOTH EXTRACTION  1991    Social History   Socioeconomic History   Marital status: Married    Spouse name: Richardson Landry   Number of children: 1   Years of education: 14   Highest education level: Not on file  Occupational History   Occupation: CMA  Tobacco Use   Smoking status: Never   Smokeless tobacco: Never  Vaping Use   Vaping Use: Never used  Substance and Sexual Activity   Alcohol use: Yes    Alcohol/week: 0.0 standard drinks    Comment: rare, 1-2 drinks per year   Drug use: No   Sexual activity: Yes    Partners: Male     Birth control/protection: Surgical    Comment: Hysterectomy   Other Topics Concern   Not on file  Social History Narrative   Lives in [place] with husband, no pets.  Works at [employer].      Diet - regular   Exercise - occasional   Social Determinants of Health   Financial Resource Strain: Not on file  Food Insecurity: Not on file  Transportation Needs: Not on file  Physical Activity: Not on file  Stress: Not on file  Social Connections: Not on file  Intimate Partner Violence: Not on file    Family History  Problem Relation Age of Onset   Hypertension Mother    Thyroid disease Mother    Breast cancer Mother 30   Colon cancer Mother 57       again at 35   Atrial fibrillation Mother    Hip fracture Mother    Skin cancer Mother    Stroke Father    Hypertension Father    Cancer Father 58       prostate   Parkinson's disease Father    Skin cancer Father    Hypertension Sister    Thyroid disease Sister    Cancer Sister        skin/ squamous cell   Lung cancer Paternal Aunt 80       smoker   Diabetes Maternal Grandmother    Stroke Maternal Grandfather    Heart disease Paternal Grandfather    Diabetes Maternal Aunt    Lymphoma Maternal Uncle    Thyroid cancer Cousin        maternal   Heart disease Maternal Uncle    Cancer Maternal Aunt    Breast cancer Maternal Aunt 75   Breast cancer Cousin 60       maternal   Breast cancer Cousin 7   Colon cancer Cousin      Current Outpatient Medications:    acetaminophen (TYLENOL) 325 MG tablet, Take 650 mg by mouth every 6 (six) hours as needed for mild pain or headache., Disp: , Rfl:    buPROPion (WELLBUTRIN XL) 300 MG 24 hr tablet, TAKE 1 TABLET BY MOUTH ONCE DAILY, Disp: 90 tablet, Rfl: 0   citalopram (CELEXA) 40 MG tablet, Take 1 tablet (40  mg total) by mouth daily., Disp: 90 tablet, Rfl: 1   clotrimazole (LOTRIMIN) 1 % cream, Apply 1 application topically daily. groin, Disp: , Rfl:    clotrimazole-betamethasone  (LOTRISONE) cream, APPLY EXTERNALLY TWICE A DAY FOR 2 WEEKS, Disp: 45 g, Rfl: 0   diltiazem (CARDIZEM CD) 120 MG 24 hr capsule, TAKE 1 CAPSULE BY MOUTH DAILY., Disp: 30 capsule, Rfl: 10   diphenhydrAMINE (BENADRYL) 25 MG tablet, Take 25 mg by mouth at bedtime as needed for sleep., Disp: , Rfl:    gabapentin (NEURONTIN) 100 MG capsule, Take 1 capsule (100 mg total) by mouth 3 (three) times daily, Disp: 270 capsule, Rfl: 1   ketotifen (ZADITOR) 0.025 % ophthalmic solution, Place 1 drop into both eyes daily., Disp: , Rfl:    letrozole (FEMARA) 2.5 MG tablet, TAKE 1 TABLET (2.5 MG TOTAL) BY MOUTH DAILY., Disp: 90 tablet, Rfl: 4   lisinopril (ZESTRIL) 10 MG tablet, TAKE 1 TABLET BY MOUTH ONCE DAILY, Disp: 90 tablet, Rfl: 1   metFORMIN (GLUCOPHAGE-XR) 500 MG 24 hr tablet, TAKE 1 TABLET BY MOUTH TWICE DAILY WITH A MEAL, Disp: 180 tablet, Rfl: 1   Multiple Vitamin (MULTIVITAMIN) tablet, Take 1 tablet by mouth daily., Disp: , Rfl:    omeprazole (PRILOSEC) 20 MG capsule, TAKE 1 CAPSULE BY MOUTH 2 TIMES DAILY TAKE 30 MIN BEFORE MEALS., Disp: 180 capsule, Rfl: 1   ondansetron (ZOFRAN) 4 MG tablet, Take 1 tablet (4 mg total) by mouth every 8 (eight) hours as needed for nausea or vomiting., Disp: 30 tablet, Rfl: 0   palbociclib (IBRANCE) 75 MG tablet, Take 1 tablet (75 mg total) by mouth daily. Take for 21 days on, 7 days off, repeat every 28 days., Disp: 21 tablet, Rfl: 1   COVID-19 mRNA vaccine, Pfizer, 30 MCG/0.3ML injection, USE AS DIRECTED (Patient not taking: Reported on 05/15/2021), Disp: .3 mL, Rfl: 0 No current facility-administered medications for this visit.  Facility-Administered Medications Ordered in Other Visits:    0.9 %  sodium chloride infusion, , Intravenous, Continuous, Rogue Bussing, Elisha Headland, MD, Stopped at 05/15/21 1150   Zoledronic Acid (ZOMETA) IVPB 4 mg, 4 mg, Intravenous, Q28 days, Sindy Guadeloupe, MD, Stopped at 05/15/21 1148  Physical exam:  Physical Exam Cardiovascular:     Rate and  Rhythm: Normal rate and regular rhythm.     Heart sounds: Normal heart sounds.  Pulmonary:     Effort: Pulmonary effort is normal.  Skin:    General: Skin is warm and dry.  Neurological:     Mental Status: She is alert and oriented to person, place, and time.     CMP Latest Ref Rng & Units 05/15/2021  Glucose 70 - 99 mg/dL 106(H)  BUN 6 - 20 mg/dL 19  Creatinine 0.44 - 1.00 mg/dL 1.02(H)  Sodium 135 - 145 mmol/L 136  Potassium 3.5 - 5.1 mmol/L 4.5  Chloride 98 - 111 mmol/L 102  CO2 22 - 32 mmol/L 22  Calcium 8.9 - 10.3 mg/dL 9.0  Total Protein 6.5 - 8.1 g/dL 7.3  Total Bilirubin 0.3 - 1.2 mg/dL 0.4  Alkaline Phos 38 - 126 U/L 91  AST 15 - 41 U/L 22  ALT 0 - 44 U/L 23   CBC Latest Ref Rng & Units 05/15/2021  WBC 4.0 - 10.5 K/uL 3.1(L)  Hemoglobin 12.0 - 15.0 g/dL 12.2  Hematocrit 36.0 - 46.0 % 37.4  Platelets 150 - 400 K/uL 156      Assessment and plan- Patient is a  60 y.o. female with metastatic ER/PR positive HER2 negative breast cancer with bone metastases here for routine follow-up and to receive Zometa  Patient has baseline leukopenia/lymphopenia secondary to [facility] which is overall stable.  She is tolerating treatment well.  I will see her back in 1 month with scans prior.  I will discuss getting a bone density scan at my next visit as we have not had one done recently  Bone metastases: Zometa today and Zometa in 1 month's time.  Neoplasm related pain: Continue as needed oxycodone   Visit Diagnosis 1. Metastatic breast cancer ([place])   2. Encounter for monitoring zoledronic acid therapy   3. High risk medication use      Dr. Randa Evens, MD, MPH [facility] at [facility] [phone] 05/15/2021 1:01 PM

## 2021-05-15 NOTE — Patient Instructions (Signed)

## 2021-05-15 NOTE — Progress Notes (Signed)
Pt doing good, she has no pain at this time. She is tolerating ibrance

## 2021-05-19 ENCOUNTER — Encounter: Payer: Self-pay | Admitting: Oncology

## 2021-05-19 DIAGNOSIS — C50919 Malignant neoplasm of unspecified site of unspecified female breast: Secondary | ICD-10-CM | POA: Diagnosis not present

## 2021-05-19 DIAGNOSIS — F325 Major depressive disorder, single episode, in full remission: Secondary | ICD-10-CM | POA: Diagnosis not present

## 2021-05-19 DIAGNOSIS — E1159 Type 2 diabetes mellitus with other circulatory complications: Secondary | ICD-10-CM | POA: Diagnosis not present

## 2021-05-19 DIAGNOSIS — F419 Anxiety disorder, unspecified: Secondary | ICD-10-CM | POA: Diagnosis not present

## 2021-05-25 ENCOUNTER — Other Ambulatory Visit (HOSPITAL_COMMUNITY): Payer: Self-pay

## 2021-05-25 ENCOUNTER — Other Ambulatory Visit: Payer: Self-pay | Admitting: Oncology

## 2021-05-25 ENCOUNTER — Other Ambulatory Visit: Payer: Self-pay | Admitting: Obstetrics and Gynecology

## 2021-05-25 DIAGNOSIS — B354 Tinea corporis: Secondary | ICD-10-CM

## 2021-05-25 MED ORDER — ONDANSETRON HCL 4 MG PO TABS
4.0000 mg | ORAL_TABLET | Freq: Three times a day (TID) | ORAL | 0 refills | Status: DC | PRN
  Filled 2021-05-25: qty 30, 10d supply, fill #0

## 2021-05-25 MED ORDER — CLOTRIMAZOLE-BETAMETHASONE 1-0.05 % EX CREA
TOPICAL_CREAM | CUTANEOUS | 0 refills | Status: AC
  Filled 2021-05-25: qty 45, 14d supply, fill #0

## 2021-05-27 ENCOUNTER — Encounter: Payer: Self-pay | Admitting: Oncology

## 2021-06-03 ENCOUNTER — Other Ambulatory Visit (HOSPITAL_COMMUNITY): Payer: Self-pay

## 2021-06-05 ENCOUNTER — Other Ambulatory Visit (HOSPITAL_COMMUNITY): Payer: Self-pay

## 2021-06-10 ENCOUNTER — Other Ambulatory Visit (HOSPITAL_COMMUNITY): Payer: Self-pay

## 2021-06-12 ENCOUNTER — Other Ambulatory Visit: Payer: Self-pay

## 2021-06-12 ENCOUNTER — Inpatient Hospital Stay (HOSPITAL_BASED_OUTPATIENT_CLINIC_OR_DEPARTMENT_OTHER): Payer: 59 | Admitting: Oncology

## 2021-06-12 ENCOUNTER — Inpatient Hospital Stay: Payer: 59

## 2021-06-12 ENCOUNTER — Inpatient Hospital Stay: Payer: 59 | Attending: Oncology

## 2021-06-12 ENCOUNTER — Encounter: Payer: Self-pay | Admitting: Oncology

## 2021-06-12 VITALS — BP 106/81 | HR 79 | Temp 98.8°F | Resp 16 | Ht 63.0 in | Wt 272.0 lb

## 2021-06-12 DIAGNOSIS — Z170 Estrogen receptor positive status [ER+]: Secondary | ICD-10-CM | POA: Insufficient documentation

## 2021-06-12 DIAGNOSIS — Z79811 Long term (current) use of aromatase inhibitors: Secondary | ICD-10-CM | POA: Insufficient documentation

## 2021-06-12 DIAGNOSIS — D72819 Decreased white blood cell count, unspecified: Secondary | ICD-10-CM | POA: Diagnosis not present

## 2021-06-12 DIAGNOSIS — Z79899 Other long term (current) drug therapy: Secondary | ICD-10-CM | POA: Diagnosis not present

## 2021-06-12 DIAGNOSIS — C50919 Malignant neoplasm of unspecified site of unspecified female breast: Secondary | ICD-10-CM

## 2021-06-12 DIAGNOSIS — Z5181 Encounter for therapeutic drug level monitoring: Secondary | ICD-10-CM | POA: Diagnosis not present

## 2021-06-12 DIAGNOSIS — C50911 Malignant neoplasm of unspecified site of right female breast: Secondary | ICD-10-CM | POA: Diagnosis not present

## 2021-06-12 DIAGNOSIS — C7951 Secondary malignant neoplasm of bone: Secondary | ICD-10-CM | POA: Insufficient documentation

## 2021-06-12 DIAGNOSIS — Z7983 Long term (current) use of bisphosphonates: Secondary | ICD-10-CM

## 2021-06-12 LAB — CBC WITH DIFFERENTIAL/PLATELET
Abs Immature Granulocytes: 0.01 10*3/uL (ref 0.00–0.07)
Basophils Absolute: 0 10*3/uL (ref 0.0–0.1)
Basophils Relative: 1 %
Eosinophils Absolute: 0.1 10*3/uL (ref 0.0–0.5)
Eosinophils Relative: 2 %
HCT: 38.3 % (ref 36.0–46.0)
Hemoglobin: 12.5 g/dL (ref 12.0–15.0)
Immature Granulocytes: 0 %
Lymphocytes Relative: 25 %
Lymphs Abs: 0.9 10*3/uL (ref 0.7–4.0)
MCH: 32.9 pg (ref 26.0–34.0)
MCHC: 32.6 g/dL (ref 30.0–36.0)
MCV: 100.8 fL — ABNORMAL HIGH (ref 80.0–100.0)
Monocytes Absolute: 0.5 10*3/uL (ref 0.1–1.0)
Monocytes Relative: 14 %
Neutro Abs: 1.9 10*3/uL (ref 1.7–7.7)
Neutrophils Relative %: 58 %
Platelets: 168 10*3/uL (ref 150–400)
RBC: 3.8 MIL/uL — ABNORMAL LOW (ref 3.87–5.11)
RDW: 15.7 % — ABNORMAL HIGH (ref 11.5–15.5)
WBC: 3.4 10*3/uL — ABNORMAL LOW (ref 4.0–10.5)
nRBC: 0 % (ref 0.0–0.2)

## 2021-06-12 LAB — COMPREHENSIVE METABOLIC PANEL
ALT: 20 U/L (ref 0–44)
AST: 20 U/L (ref 15–41)
Albumin: 4.1 g/dL (ref 3.5–5.0)
Alkaline Phosphatase: 91 U/L (ref 38–126)
Anion gap: 11 (ref 5–15)
BUN: 22 mg/dL — ABNORMAL HIGH (ref 6–20)
CO2: 23 mmol/L (ref 22–32)
Calcium: 9.4 mg/dL (ref 8.9–10.3)
Chloride: 105 mmol/L (ref 98–111)
Creatinine, Ser: 1.2 mg/dL — ABNORMAL HIGH (ref 0.44–1.00)
GFR, Estimated: 52 mL/min — ABNORMAL LOW (ref >60)
Glucose, Bld: 110 mg/dL — ABNORMAL HIGH (ref 70–99)
Potassium: 4.3 mmol/L (ref 3.5–5.1)
Sodium: 139 mmol/L (ref 135–145)
Total Bilirubin: <0.1 mg/dL — ABNORMAL LOW (ref 0.3–1.2)
Total Protein: 7.3 g/dL (ref 6.5–8.1)

## 2021-06-12 MED ORDER — SODIUM CHLORIDE 0.9 % IV SOLN
INTRAVENOUS | Status: DC
  Filled 2021-06-12: qty 250

## 2021-06-12 MED ORDER — ZOLEDRONIC ACID 4 MG/100ML IV SOLN
4.0000 mg | INTRAVENOUS | Status: DC
  Administered 2021-06-12: 4 mg via INTRAVENOUS
  Filled 2021-06-12: qty 100

## 2021-06-12 NOTE — Patient Instructions (Signed)
[facility]  Discharge Instructions: °Thank you for choosing [facility] to provide your oncology and hematology care.  °If you have a lab appointment with the Cancer Center, please go directly to the Cancer Center and check in at the registration area. ° °Wear comfortable clothing and clothing appropriate for easy access to any Portacath or PICC line.  ° °We strive to give you quality time with your provider. You may need to reschedule your appointment if you arrive late (15 or more minutes).  Arriving late affects you and other patients whose appointments are after yours.  Also, if you miss three or more appointments without notifying the office, you may be dismissed from the clinic at the provider’s discretion.    °  °For prescription refill requests, have your pharmacy contact our office and allow 72 hours for refills to be completed.   ° °Today you received the following chemotherapy and/or immunotherapy agents ZOMETA    °  °To help prevent nausea and vomiting after your treatment, we encourage you to take your nausea medication as directed. ° °BELOW ARE SYMPTOMS THAT SHOULD BE REPORTED IMMEDIATELY: °*FEVER GREATER THAN 100.4 F (38 °C) OR HIGHER °*CHILLS OR SWEATING °*NAUSEA AND VOMITING THAT IS NOT CONTROLLED WITH YOUR NAUSEA MEDICATION °*UNUSUAL SHORTNESS OF BREATH °*UNUSUAL BRUISING OR BLEEDING °*URINARY PROBLEMS (pain or burning when urinating, or frequent urination) °*BOWEL PROBLEMS (unusual diarrhea, constipation, pain near the anus) °TENDERNESS IN MOUTH AND THROAT WITH OR WITHOUT PRESENCE OF ULCERS (sore throat, sores in mouth, or a toothache) °UNUSUAL RASH, SWELLING OR PAIN  °UNUSUAL VAGINAL DISCHARGE OR ITCHING  ° °Items with * indicate a potential emergency and should be followed up as soon as possible or go to the Emergency Department if any problems should occur. ° °Please show the CHEMOTHERAPY ALERT CARD or IMMUNOTHERAPY ALERT CARD at check-in to the  Emergency Department and triage nurse. ° °Should you have questions after your visit or need to cancel or reschedule your appointment, please contact [facility]  [phone] and follow the prompts.  Office hours are 8:00 a.m. to 4:30 p.m. Monday - Friday. Please note that voicemails left after 4:00 p.m. may not be returned until the following business day.  We are closed weekends and major holidays. You have access to a nurse at all times for urgent questions. Please call the main number to the clinic [phone] and follow the prompts. ° °For any non-urgent questions, you may also contact your provider using MyChart. We now offer e-Visits for anyone 18 and older to request care online for non-urgent symptoms. For details visit [email or web address]. °  °Also download the MyChart app! Go to the app store, search "MyChart", open the app, select [place], and log in with your MyChart username and password. ° °Due to Covid, a mask is required upon entering the hospital/clinic. If you do not have a mask, one will be given to you upon arrival. For doctor visits, patients may have 1 support person aged 18 or older with them. For treatment visits, patients cannot have anyone with them due to current Covid guidelines and our immunocompromised population.  ° °Zoledronic Acid Injection (Hypercalcemia, Oncology) °What is this medication? °ZOLEDRONIC ACID (ZOE le dron ik AS id) slows calcium loss from bones. It high calcium levels in the blood from some kinds of cancer. It may be used in other people at risk for bone loss. °This medicine may be used for other purposes; ask   your health care provider or pharmacist if you have questions. °COMMON BRAND NAME(S): Zometa °What should I tell my care team before I take this medication? °They need to know if you have any of these conditions: °cancer °dehydration °dental disease °kidney disease °liver disease °low levels of calcium in the  blood °lung or breathing disease (asthma) °receiving steroids like dexamethasone or prednisone °an unusual or allergic reaction to zoledronic acid, other medicines, foods, dyes, or preservatives °pregnant or trying to get pregnant °breast-feeding °How should I use this medication? °This drug is injected into a vein. It is given by a health care provider in a hospital or clinic setting. °Talk to your health care provider about the use of this drug in children. Special care may be needed. °Overdosage: If you think you have taken too much of this medicine contact a poison control center or emergency room at once. °NOTE: This medicine is only for you. Do not share this medicine with others. °What if I miss a dose? °Keep appointments for follow-up doses. It is important not to miss your dose. Call your health care provider if you are unable to keep an appointment. °What may interact with this medication? °certain antibiotics given by injection °NSAIDs, medicines for pain and inflammation, like ibuprofen or naproxen °some diuretics like bumetanide, furosemide °teriparatide °thalidomide °This list may not describe all possible interactions. Give your health care provider a list of all the medicines, herbs, non-prescription drugs, or dietary supplements you use. Also tell them if you smoke, drink alcohol, or use illegal drugs. Some items may interact with your medicine. °What should I watch for while using this medication? °Visit your health care provider for regular checks on your progress. It may be some time before you see the benefit from this drug. °Some people who take this drug have severe bone, joint, or muscle pain. This drug may also increase your risk for jaw problems or a broken thigh bone. Tell your health care provider right away if you have severe pain in your jaw, bones, joints, or muscles. Tell you health care provider if you have any pain that does not go away or that gets worse. °Tell your dentist and  dental surgeon that you are taking this drug. You should not have major dental surgery while on this drug. See your dentist to have a dental exam and fix any dental problems before starting this drug. Take good care of your teeth while on this drug. Make sure you see your dentist for regular follow-up appointments. °You should make sure you get enough calcium and vitamin D while you are taking this drug. Discuss the foods you eat and the vitamins you take with your health care provider. °Check with your health care provider if you have severe diarrhea, nausea, and vomiting, or if you sweat a lot. The loss of too much body fluid may make it dangerous for you to take this drug. °You may need blood work done while you are taking this drug. °Do not become pregnant while taking this drug. Women should inform their health care provider if they wish to become pregnant or think they might be pregnant. There is potential for serious harm to an unborn child. Talk to your health care provider for more information. °What side effects may I notice from receiving this medication? °Side effects that you should report to your doctor or health care provider as soon as possible: °allergic reactions (skin rash, itching or hives; swelling of the face, lips, or   tongue) °bone pain °infection (fever, chills, cough, sore throat, pain or trouble passing urine) °jaw pain, especially after dental work °joint pain °kidney injury (trouble passing urine or change in the amount of urine) °low blood pressure (dizziness; feeling faint or lightheaded, falls; unusually weak or tired) °low calcium levels (fast heartbeat; muscle cramps or pain; pain, tingling, or numbness in the hands or feet; seizures) °low magnesium levels (fast, irregular heartbeat; muscle cramp or pain; muscle weakness; tremors; seizures) °low red blood cell counts (trouble breathing; feeling faint; lightheaded, falls; unusually weak or tired) °muscle pain °redness, blistering,  peeling, or loosening of the skin, including inside the mouth °severe diarrhea °swelling of the ankles, feet, hands °trouble breathing °Side effects that usually do not require medical attention (report to your doctor or health care provider if they continue or are bothersome): °anxious °constipation °coughing °depressed mood °eye irritation, itching, or pain °fever °general ill feeling or flu-like symptoms °nausea °pain, redness, or irritation at site where injected °trouble sleeping °This list may not describe all possible side effects. Call your doctor for medical advice about side effects. You may report side effects to FDA at 1-800-FDA-1088. °Where should I keep my medication? °This drug is given in a hospital or clinic. It will not be stored at home. °NOTE: This sheet is a summary. It may not cover all possible information. If you have questions about this medicine, talk to your doctor, pharmacist, or health care provider. °© 2022 [employer] (2021-03-17 00:00:00) ° °

## 2021-06-12 NOTE — Progress Notes (Signed)
PT has arthritis like pain in shoulders in am and then knees throughout day and legs at times. Otherwise she is good

## 2021-06-13 ENCOUNTER — Encounter: Payer: Self-pay | Admitting: Oncology

## 2021-06-13 NOTE — Progress Notes (Signed)
Hematology/Oncology Consult note 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  It was a 1.4 cm tumor with negative lymph nodes grade 1 and patient went on to get lumpectomy followed by adjuvant radiation therapy.  Oncotype DX score was 6 and she did not require adjuvant chemotherapy.  She was on tamoxifen for 5 years.  She had BRCA testing done at that time which was negative.   Patient felt a lump in her right axilla in September 2021.  She underwent ultrasound of bilateral breasts which did not pick up any axillary mass.  A prior screening mammogram in September 2021 was unremarkable.  She was then admitted for Covid pneumonia and underwent CT angio chest on 07/22/2020 which incidentally picked up an axillary mass measuring 2.5 cm.  No obvious breast mass.  Axillary mass was biopsied and was consistent with high-grade invasive mammary carcinoma.  IHC was positive for GATA3 with diffuse strong nuclear staining.  There was no lymph node architecture identified in the sample and it could not be determined if it is a primary tumor within the axillary breast tissue or metastases.  ER strongly positive greater than 90%, PR 1 to 10% positive and HER-2 negative   PET CT scan showed hypermetabolic poorly marginated solid right axillary mass 2.5 x 2 cm with an SUV of 5.6.  No enlarged hypermetabolic mediastinal or hilar lymph nodes no enlarged left axillary  lymph nodes.  Subcentimeter lung nodules below PET resolution.  Small hypermetabolism in the right liver with an SUV of 6 without CT correlate equivocal for liver metastases.  Multiple hypermetabolic faintly lytic osseous lesions throughout the lumbar spine and bilateral pelvic girdle with representative supra-acetabular right iliac bone 1.9 cm lesion, anterior superior left acetabular 1.7 cm lesion and L1 1.2 cm lesion.   NGS testing showed no actionable mutations.  PD-L1 less than 1%.  CHEK2 S422 FS, K3 CA and 1068 FS, PICC 3 CA and 345H, CCN E1 gain, ERB B2 1 P-CSF 1 hour fusion, FGF 19 gain, FGF 3 gain, FGF 4gain Ibrance plus letrozole started in February 2022.  Baseline tumor markers not elevated  routine   Interval history-patient has some good days and bad days but overall she has been doing okay.  She does get fatigued after a full day of work and needs to rest for an hour or so before she can resume more activity.  She reports right upper quadrant abdominal pain which is intermittent.  Not associated with food nausea or vomiting.  Back pain and hip pain is overall stable.  ECOG PS- 1 Pain scale- 3   Review of systems- Review of Systems  Constitutional:  Positive for malaise/fatigue. Negative for chills, fever and weight loss.  HENT:  Negative for congestion, ear discharge and nosebleeds.   Eyes:  Negative for blurred vision.  Respiratory:  Negative for cough, hemoptysis, sputum production, shortness of breath and wheezing.   Cardiovascular:  Negative for chest pain, palpitations,  orthopnea and claudication.  Gastrointestinal:  Negative for abdominal pain, blood in stool, constipation, diarrhea, heartburn, melena, nausea and vomiting.  Genitourinary:  Negative for dysuria, flank pain, frequency, hematuria and urgency.  Musculoskeletal:  Negative for back pain, joint pain and myalgias.  Skin:  Negative for rash.  Neurological:  Negative for dizziness, tingling, focal weakness, seizures,  weakness and headaches.  Endo/Heme/Allergies:  Does not bruise/bleed easily.  Psychiatric/Behavioral:  Negative for depression and suicidal ideas. The patient does not have insomnia.      Allergies  Allergen Reactions   Kiwi Extract Itching and Swelling    The real kiwi fruit   Clorox Nasal Antiseptic [Povidone-Iodine] Other (See Comments)    Throat stricture when using it    Codeine Other (See Comments)    Felt funny.   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   left lower leg   Central serous retinopathy    CHEK2-related breast cancer ([place]) 07/2020   Chicken pox    Depression    Fatty liver    Fatty liver    Gastric polyps    GERD (gastroesophageal reflux disease)    Gestational diabetes    prediabetes out side of having baby   Headache    migraines   Heart murmur    History of abnormal mammogram 08/2006, 01/2008, 03/2009   Hypertension    IBS (irritable bowel syndrome)    Joint disorder    hx of left ankle tendonitis, bursitis, heel spur   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 Smith/ laprascopic surgery   COLONOSCOPY  04/04/2000   COLONOSCOPY WITH PROPOFOL N/A 02/12/2019   Procedure: COLONOSCOPY WITH PROPOFOL;  Surgeon: Lollie Sails, MD;  Location: [facility] ENDOSCOPY;  Service: Endoscopy;  Laterality: N/A;   ESOPHAGOGASTRODUODENOSCOPY (EGD) WITH PROPOFOL N/A 05/01/2015   Procedure: ESOPHAGOGASTRODUODENOSCOPY (EGD) WITH PROPOFOL;  Surgeon: Hulen Luster, MD;  Location[facility] ENDOSCOPY;  Service: Gastroenterology;  Laterality: N/A;   ESOPHAGOGASTRODUODENOSCOPY (EGD) WITH PROPOFOL N/A 02/12/2019   Procedure: ESOPHAGOGASTRODUODENOSCOPY (EGD) WITH PROPOFOL;  Surgeon: Lollie Sails, MD;  Location: [facility] ENDOSCOPY;  Service: Endoscopy;  Laterality:  N/A;   EYE SURGERY  08/2012   laser surgery on retina/ DR Appenzeller   FINGER SURGERY     repair of tendon in right index, Dr. Francesco Sor in [facility]   FOOT SURGERY     Dr. Milinda Pointer   IRRIGATION AND DEBRIDEMENT SEBACEOUS CYST     right upper back   LAPAROSCOPIC SUPRACERVICAL HYSTERECTOMY  06/2007   Dr Kincius/ adhesions/CPP/adenomyosis   MASTECTOMY PARTIAL / LUMPECTOMY Right 01/2008   with sentinal lymph node biopsy/ infiltrating ductal cancer   REPAIR PERONEAL TENDONS ANKLE  10/2019   with tarsal exostectomy and sural neuroma excision on right    SKIN CANCER EXCISION     back and calves   WISDOM TOOTH EXTRACTION  1991    Social History   Socioeconomic History   Marital status: Married    Spouse name: Richardson Landry   Number of children: 1   Years of education: 14   Highest education level: Not on file  Occupational History   Occupation: CMA  Tobacco Use   Smoking status: Never   Smokeless tobacco: Never  Vaping Use   Vaping Use: Never used  Substance and Sexual Activity   Alcohol  use: Yes    Alcohol/week: 0.0 standard drinks    Comment: rare, 1-2 drinks per year   Drug use: No   Sexual activity: Yes    Partners: Male    Birth control/protection: Surgical    Comment: Hysterectomy   Other Topics Concern   Not on file  Social History Narrative   Lives in [place] with husband, no pets.  Works at [employer].      Diet - regular   Exercise - occasional   Social Determinants of Health   Financial Resource Strain: Not on file  Food Insecurity: Not on file  Transportation Needs: Not on file  Physical Activity: Not on file  Stress: Not on file  Social Connections: Not on file  Intimate Partner Violence: Not on file    Family History  Problem Relation Age of Onset   Hypertension Mother    Thyroid disease Mother    Breast cancer Mother 68   Colon cancer Mother 35       again at 50   Atrial fibrillation Mother    Hip fracture Mother    Skin cancer Mother    Stroke Father    Hypertension Father    Cancer Father 63       prostate   Parkinson's disease Father    Skin cancer Father    Hypertension Sister    Thyroid disease Sister    Cancer Sister        skin/ squamous cell   Lung cancer Paternal Aunt 80       smoker   Diabetes Maternal Grandmother    Stroke Maternal Grandfather    Heart disease Paternal Grandfather    Diabetes Maternal Aunt    Lymphoma Maternal Uncle    Thyroid cancer Cousin        maternal   Heart disease Maternal Uncle    Cancer Maternal Aunt    Breast cancer Maternal Aunt 75   Breast cancer Cousin 60       maternal   Breast cancer Cousin 81   Colon cancer Cousin      Current Outpatient Medications:    acetaminophen (TYLENOL) 325 MG tablet, Take 650 mg by mouth every 6 (six) hours as needed for mild pain or headache., Disp: , Rfl:    buPROPion (  WELLBUTRIN XL) 300 MG 24 hr tablet, Take 1 tablet by mouth daily., Disp: , Rfl:    citalopram (CELEXA) 40 MG tablet, Take 1 tablet (40 mg total) by mouth daily., Disp: 90 tablet,  Rfl: 1   clotrimazole (LOTRIMIN) 1 % cream, Apply 1 application topically daily. groin, Disp: , Rfl:    clotrimazole-betamethasone (LOTRISONE) cream, APPLY EXTERNALLY TWICE A DAY FOR 2 WEEKS, Disp: 45 g, Rfl: 0   diltiazem (CARDIZEM CD) 120 MG 24 hr capsule, TAKE 1 CAPSULE BY MOUTH DAILY., Disp: 30 capsule, Rfl: 10   diphenhydrAMINE (BENADRYL) 25 MG tablet, Take 25 mg by mouth at bedtime as needed for sleep., Disp: , Rfl:    gabapentin (NEURONTIN) 100 MG capsule, Take 1 capsule (100 mg total) by mouth 3 (three) times daily, Disp: 270 capsule, Rfl: 1   ketotifen (ZADITOR) 0.025 % ophthalmic solution, Place 1 drop into both eyes daily., Disp: , Rfl:    letrozole (FEMARA) 2.5 MG tablet, TAKE 1 TABLET (2.5 MG TOTAL) BY MOUTH DAILY., Disp: 90 tablet, Rfl: 4   lisinopril (ZESTRIL) 10 MG tablet, TAKE 1 TABLET BY MOUTH ONCE DAILY, Disp: 90 tablet, Rfl: 1   metFORMIN (GLUCOPHAGE-XR) 500 MG 24 hr tablet, TAKE 1 TABLET BY MOUTH TWICE DAILY WITH A MEAL, Disp: 180 tablet, Rfl: 1   Multiple Vitamin (MULTIVITAMIN) tablet, Take 1 tablet by mouth daily., Disp: , Rfl:    omeprazole (PRILOSEC) 20 MG capsule, TAKE 1 CAPSULE BY MOUTH 2 TIMES DAILY TAKE 30 MIN BEFORE MEALS., Disp: 180 capsule, Rfl: 1   ondansetron (ZOFRAN) 4 MG tablet, Take 1 tablet (4 mg total) by mouth every 8 (eight) hours as needed for nausea or vomiting., Disp: 30 tablet, Rfl: 0   palbociclib (IBRANCE) 75 MG tablet, Take 1 tablet (75 mg total) by mouth daily. Take for 21 days on, 7 days off, repeat every 28 days., Disp: 21 tablet, Rfl: 1  Physical exam:  Vitals:   06/12/21 1407  BP: 106/81  Pulse: 79  Resp: 16  Temp: 98.8 F (37.1 C)  TempSrc: Oral  Weight: 272 lb (123.4 kg)  Height: _0  (1.6 m)   Physical Exam Constitutional:      General: She is not in acute distress. Cardiovascular:     Rate and Rhythm: Normal rate and regular rhythm.     Heart sounds: Normal heart sounds.  Pulmonary:     Effort: Pulmonary effort is normal.      Breath sounds: Normal breath sounds.  Abdominal:     General: Bowel sounds are normal.     Palpations: Abdomen is soft.  Skin:    General: Skin is warm and dry.  Neurological:     Mental Status: She is alert and oriented to person, place, and time.     CMP Latest Ref Rng & Units 06/12/2021  Glucose 70 - 99 mg/dL 110(H)  BUN 6 - 20 mg/dL 22(H)  Creatinine 0.44 - 1.00 mg/dL 1.20(H)  Sodium 135 - 145 mmol/L 139  Potassium 3.5 - 5.1 mmol/L 4.3  Chloride 98 - 111 mmol/L 105  CO2 22 - 32 mmol/L 23  Calcium 8.9 - 10.3 mg/dL 9.4  Total Protein 6.5 - 8.1 g/dL 7.3  Total Bilirubin 0.3 - 1.2 mg/dL <0.1(L)  Alkaline Phos 38 - 126 U/L 91  AST 15 - 41 U/L 20  ALT 0 - 44 U/L 20   CBC Latest Ref Rng & Units 06/12/2021  WBC 4.0 - 10.5 K/uL 3.4(L)  Hemoglobin 12.0 -  15.0 g/dL 12.5  Hematocrit 36.0 - 46.0 % 38.3  Platelets 150 - 400 K/uL 168   Assessment and plan- Patient is a 60 y.o. female with metastatic ER/PR positive HER2 negative breast cancer with bone metastases.  She is here for routine follow-up of breast cancer on Ibrance and letrozole and to receive Zometa  We discussed that her renal functions are mildly elevated with a creatinine of 1.2 as compared to a baseline of 0.8 2.9 prior.  Encouraged the importance of adequate fluid intake.  She has had a bone density scan in September 2021 which was normal.  Calcium levels are acceptable to proceed with Zometa today.  She has an upcoming CT scan and bone scan next week.  CBC with differential CMP CA 27-29 in 4 weeks and 8 weeks.  I will see her in 8 weeks.  She receives Zometa every 4 weeks.  Continue letrozole and Ibrance until progression or toxicity.  Mild leukopenia that is expected from [place] but [place] remains more than 1.   Visit Diagnosis 1. Metastatic breast cancer ([place])   2. Encounter for monitoring zoledronic acid therapy   3. High risk medication use      Dr. Randa Evens, MD, MPH [facility] [phone] 06/13/2021 10:03 AM

## 2021-06-14 ENCOUNTER — Other Ambulatory Visit: Payer: Self-pay | Admitting: Oncology

## 2021-06-14 ENCOUNTER — Other Ambulatory Visit (HOSPITAL_COMMUNITY): Payer: Self-pay

## 2021-06-15 ENCOUNTER — Encounter: Payer: Self-pay | Admitting: Oncology

## 2021-06-15 ENCOUNTER — Other Ambulatory Visit: Payer: Self-pay

## 2021-06-15 ENCOUNTER — Other Ambulatory Visit (HOSPITAL_COMMUNITY): Payer: Self-pay

## 2021-06-15 MED ORDER — ONDANSETRON HCL 4 MG PO TABS
4.0000 mg | ORAL_TABLET | Freq: Three times a day (TID) | ORAL | 0 refills | Status: DC | PRN
  Filled 2021-06-15: qty 30, 10d supply, fill #0

## 2021-06-15 MED ORDER — LISINOPRIL 10 MG PO TABS
10.0000 mg | ORAL_TABLET | Freq: Every day | ORAL | 1 refills | Status: DC
  Filled 2021-06-15: qty 90, 90d supply, fill #0
  Filled 2021-09-25: qty 90, 90d supply, fill #1

## 2021-06-18 ENCOUNTER — Other Ambulatory Visit: Payer: Self-pay

## 2021-06-18 ENCOUNTER — Encounter
Admission: RE | Admit: 2021-06-18 | Discharge: 2021-06-18 | Disposition: A | Discharge status: Home / Self Care | Payer: 59 | Source: Ambulatory Visit | Attending: Oncology | Admitting: Oncology

## 2021-06-18 ENCOUNTER — Ambulatory Visit
Admission: RE | Admit: 2021-06-18 | Discharge: 2021-06-18 | Disposition: A | Discharge status: Home / Self Care | Payer: 59 | Source: Ambulatory Visit | Attending: Oncology | Admitting: Oncology

## 2021-06-18 DIAGNOSIS — C50911 Malignant neoplasm of unspecified site of right female breast: Secondary | ICD-10-CM | POA: Diagnosis not present

## 2021-06-18 DIAGNOSIS — C50919 Malignant neoplasm of unspecified site of unspecified female breast: Secondary | ICD-10-CM | POA: Diagnosis not present

## 2021-06-18 DIAGNOSIS — D1771 Benign lipomatous neoplasm of kidney: Secondary | ICD-10-CM | POA: Diagnosis not present

## 2021-06-18 DIAGNOSIS — K5730 Diverticulosis of large intestine without perforation or abscess without bleeding: Secondary | ICD-10-CM | POA: Diagnosis not present

## 2021-06-18 DIAGNOSIS — R911 Solitary pulmonary nodule: Secondary | ICD-10-CM | POA: Diagnosis not present

## 2021-06-18 DIAGNOSIS — C7951 Secondary malignant neoplasm of bone: Secondary | ICD-10-CM | POA: Diagnosis not present

## 2021-06-18 MED ORDER — IOHEXOL 300 MG/ML  SOLN
100.0000 mL | Freq: Once | INTRAMUSCULAR | Status: AC | PRN
  Administered 2021-06-18: 100 mL via INTRAVENOUS

## 2021-06-18 MED ORDER — TECHNETIUM TC 99M MEDRONATE IV KIT
20.0000 | PACK | Freq: Once | INTRAVENOUS | Status: AC | PRN
  Administered 2021-06-18: 21.89 via INTRAVENOUS

## 2021-06-22 ENCOUNTER — Encounter: Payer: Self-pay | Admitting: Oncology

## 2021-07-02 ENCOUNTER — Other Ambulatory Visit (HOSPITAL_COMMUNITY): Payer: Self-pay

## 2021-07-07 ENCOUNTER — Other Ambulatory Visit (HOSPITAL_COMMUNITY): Payer: Self-pay

## 2021-07-07 ENCOUNTER — Other Ambulatory Visit: Payer: Self-pay | Admitting: Oncology

## 2021-07-07 DIAGNOSIS — C50919 Malignant neoplasm of unspecified site of unspecified female breast: Secondary | ICD-10-CM

## 2021-07-07 MED ORDER — PALBOCICLIB 75 MG PO TABS
75.0000 mg | ORAL_TABLET | Freq: Every day | ORAL | 1 refills | Status: DC
  Filled 2021-07-07: qty 21, 21d supply, fill #0
  Filled 2021-07-22 (×2): qty 21, 28d supply, fill #1

## 2021-07-12 ENCOUNTER — Other Ambulatory Visit (HOSPITAL_COMMUNITY): Payer: Self-pay

## 2021-07-13 ENCOUNTER — Other Ambulatory Visit (HOSPITAL_COMMUNITY): Payer: Self-pay

## 2021-07-14 ENCOUNTER — Other Ambulatory Visit (HOSPITAL_COMMUNITY): Payer: Self-pay

## 2021-07-17 ENCOUNTER — Inpatient Hospital Stay: Payer: 59

## 2021-07-17 ENCOUNTER — Inpatient Hospital Stay: Payer: 59 | Attending: Oncology

## 2021-07-17 ENCOUNTER — Other Ambulatory Visit: Payer: Self-pay

## 2021-07-17 VITALS — BP 138/70 | HR 81 | Temp 98.2°F

## 2021-07-17 DIAGNOSIS — C7951 Secondary malignant neoplasm of bone: Secondary | ICD-10-CM | POA: Insufficient documentation

## 2021-07-17 DIAGNOSIS — Z170 Estrogen receptor positive status [ER+]: Secondary | ICD-10-CM | POA: Diagnosis not present

## 2021-07-17 DIAGNOSIS — C50919 Malignant neoplasm of unspecified site of unspecified female breast: Secondary | ICD-10-CM

## 2021-07-17 DIAGNOSIS — C50911 Malignant neoplasm of unspecified site of right female breast: Secondary | ICD-10-CM | POA: Insufficient documentation

## 2021-07-17 LAB — COMPREHENSIVE METABOLIC PANEL
ALT: 27 U/L (ref 0–44)
AST: 23 U/L (ref 15–41)
Albumin: 3.9 g/dL (ref 3.5–5.0)
Alkaline Phosphatase: 103 U/L (ref 38–126)
Anion gap: 9 (ref 5–15)
BUN: 20 mg/dL (ref 6–20)
CO2: 24 mmol/L (ref 22–32)
Calcium: 8.9 mg/dL (ref 8.9–10.3)
Chloride: 102 mmol/L (ref 98–111)
Creatinine, Ser: 1.11 mg/dL — ABNORMAL HIGH (ref 0.44–1.00)
GFR, Estimated: 58 mL/min — ABNORMAL LOW (ref >60)
Glucose, Bld: 96 mg/dL (ref 70–99)
Potassium: 4.4 mmol/L (ref 3.5–5.1)
Sodium: 135 mmol/L (ref 135–145)
Total Bilirubin: 0.4 mg/dL (ref 0.3–1.2)
Total Protein: 7.4 g/dL (ref 6.5–8.1)

## 2021-07-17 LAB — CBC WITH DIFFERENTIAL/PLATELET
Abs Immature Granulocytes: 0.09 10*3/uL — ABNORMAL HIGH (ref 0.00–0.07)
Basophils Absolute: 0.1 10*3/uL (ref 0.0–0.1)
Basophils Relative: 1 %
Eosinophils Absolute: 0.1 10*3/uL (ref 0.0–0.5)
Eosinophils Relative: 2 %
HCT: 36.7 % (ref 36.0–46.0)
Hemoglobin: 12 g/dL (ref 12.0–15.0)
Immature Granulocytes: 2 %
Lymphocytes Relative: 19 %
Lymphs Abs: 1.1 10*3/uL (ref 0.7–4.0)
MCH: 32.2 pg (ref 26.0–34.0)
MCHC: 32.7 g/dL (ref 30.0–36.0)
MCV: 98.4 fL (ref 80.0–100.0)
Monocytes Absolute: 0.9 10*3/uL (ref 0.1–1.0)
Monocytes Relative: 15 %
Neutro Abs: 3.8 10*3/uL (ref 1.7–7.7)
Neutrophils Relative %: 61 %
Platelets: 193 10*3/uL (ref 150–400)
RBC: 3.73 MIL/uL — ABNORMAL LOW (ref 3.87–5.11)
RDW: 14.9 % (ref 11.5–15.5)
WBC: 6.2 10*3/uL (ref 4.0–10.5)
nRBC: 0 % (ref 0.0–0.2)

## 2021-07-17 MED ORDER — SODIUM CHLORIDE 0.9 % IV SOLN
Freq: Once | INTRAVENOUS | Status: AC
  Filled 2021-07-17: qty 250

## 2021-07-17 MED ORDER — ZOLEDRONIC ACID 4 MG/100ML IV SOLN
4.0000 mg | INTRAVENOUS | Status: DC
  Administered 2021-07-17: 4 mg via INTRAVENOUS
  Filled 2021-07-17: qty 100

## 2021-07-17 NOTE — Progress Notes (Signed)
Creatinine 1.11, Per Dr. Janese Banks okay to proceed with Zometa.

## 2021-07-17 NOTE — Patient Instructions (Signed)

## 2021-07-21 ENCOUNTER — Other Ambulatory Visit: Payer: Self-pay

## 2021-07-21 MED ORDER — ZOSTER VAC RECOMB ADJUVANTED 50 MCG/0.5ML IM SUSR
INTRAMUSCULAR | 1 refills | Status: DC
  Filled 2021-07-21 – 2021-07-23 (×2): qty 0.5, 1d supply, fill #0
  Filled 2021-07-24 – 2022-01-22 (×2): qty 0.5, 1d supply, fill #1

## 2021-07-22 ENCOUNTER — Encounter: Payer: Self-pay | Admitting: Oncology

## 2021-07-22 ENCOUNTER — Other Ambulatory Visit: Payer: Self-pay | Admitting: Oncology

## 2021-07-22 ENCOUNTER — Telehealth: Payer: Self-pay | Admitting: Pharmacy Technician

## 2021-07-22 ENCOUNTER — Other Ambulatory Visit (HOSPITAL_COMMUNITY): Payer: Self-pay

## 2021-07-22 MED ORDER — ONDANSETRON HCL 4 MG PO TABS
4.0000 mg | ORAL_TABLET | Freq: Three times a day (TID) | ORAL | 0 refills | Status: DC | PRN
  Filled 2021-07-22: qty 30, 10d supply, fill #0

## 2021-07-23 ENCOUNTER — Other Ambulatory Visit: Payer: Self-pay

## 2021-07-24 ENCOUNTER — Other Ambulatory Visit: Payer: Self-pay

## 2021-07-24 NOTE — Telephone Encounter (Signed)
Oral Oncology Patient Advocate Encounter   Received notification from [employer] that the existing prior authorization for Jacqueline Deleon is due for renewal.   Renewal PA submitted on CoverMyMeds Key [id number] Status is pending   Oral Oncology Clinic will continue to follow.  [place] Patient [place] Phone [phone] Fax [phone] 07/24/2021 3:13 PM

## 2021-07-24 NOTE — Telephone Encounter (Signed)
Oral Oncology Patient Advocate Encounter  Prior Authorization for Leslee Home has been approved.    PA# [id number] Effective dates: 07/22/21 through 07/21/22. Max of 12 fills.  Patients co-pay is $3250.00.  Obtained copay card to reduce copay to $0.  Oral Oncology Clinic will continue to follow.   [place] Patient [place] Phone [phone] Fax [phone] 07/24/2021 3:14 PM

## 2021-07-27 ENCOUNTER — Other Ambulatory Visit (HOSPITAL_COMMUNITY): Payer: Self-pay

## 2021-07-27 MED ORDER — METFORMIN HCL ER 500 MG PO TB24
ORAL_TABLET | ORAL | 1 refills | Status: DC
  Filled 2021-07-27: qty 180, 90d supply, fill #0

## 2021-07-28 ENCOUNTER — Other Ambulatory Visit (HOSPITAL_COMMUNITY): Payer: Self-pay

## 2021-07-29 ENCOUNTER — Other Ambulatory Visit (HOSPITAL_COMMUNITY): Payer: Self-pay

## 2021-08-03 ENCOUNTER — Encounter: Payer: Self-pay | Admitting: Oncology

## 2021-08-03 ENCOUNTER — Telehealth: Payer: Self-pay | Admitting: *Deleted

## 2021-08-03 NOTE — Telephone Encounter (Signed)
FMLA form received on 07/31/2021. Forms Completed-08/03/21 pending md signature- will fax to [employer].

## 2021-08-04 ENCOUNTER — Encounter: Payer: Self-pay | Admitting: Oncology

## 2021-08-04 NOTE — Telephone Encounter (Signed)
Fmla faxed to [employer]. Pt updated via mychart that fmla has been processed and faxed.

## 2021-08-06 ENCOUNTER — Other Ambulatory Visit (HOSPITAL_COMMUNITY): Payer: Self-pay

## 2021-08-07 ENCOUNTER — Other Ambulatory Visit (HOSPITAL_COMMUNITY): Payer: Self-pay

## 2021-08-07 MED FILL — Letrozole Tab 2.5 MG: ORAL | 90 days supply | Qty: 90 | Fill #3 | Status: AC

## 2021-08-10 ENCOUNTER — Ambulatory Visit (INDEPENDENT_AMBULATORY_CARE_PROVIDER_SITE_OTHER): Payer: 59 | Admitting: Obstetrics and Gynecology

## 2021-08-10 ENCOUNTER — Other Ambulatory Visit (HOSPITAL_COMMUNITY): Payer: Self-pay

## 2021-08-10 ENCOUNTER — Other Ambulatory Visit: Payer: Self-pay

## 2021-08-10 ENCOUNTER — Encounter: Payer: Self-pay | Admitting: Obstetrics and Gynecology

## 2021-08-10 VITALS — BP 130/80 | Ht 63.0 in | Wt 276.0 lb

## 2021-08-10 DIAGNOSIS — R2232 Localized swelling, mass and lump, left upper limb: Secondary | ICD-10-CM | POA: Diagnosis not present

## 2021-08-10 DIAGNOSIS — L089 Local infection of the skin and subcutaneous tissue, unspecified: Secondary | ICD-10-CM

## 2021-08-10 DIAGNOSIS — C50911 Malignant neoplasm of unspecified site of right female breast: Secondary | ICD-10-CM

## 2021-08-10 MED ORDER — CEPHALEXIN 500 MG PO CAPS
500.0000 mg | ORAL_CAPSULE | Freq: Two times a day (BID) | ORAL | 0 refills | Status: AC
  Filled 2021-08-10: qty 20, 10d supply, fill #0

## 2021-08-10 NOTE — Progress Notes (Signed)
Jacqueline Late, Jacqueline Deleon   Chief Complaint  Patient presents with   Follow-up    HPI:      Jacqueline Deleon is a 61 y.o. G1P0101 whose LMP was No LMP recorded. Patient has had a hysterectomy., presents today for rash LT inguinal area. Pt with hx of tinea corporis there 3/22, treated with diflucan and lotrison crm with sx relief. Sx recurred a month ago and pt treating with lotrisone crm for past month and triple abx oint. Has had some improvement but notices areas with "holes" and "white covering". Had bad smell but that is improved. Same area also as radiation to hip for recurrent breast cancer with mets. Underwear doesn't rub in inguinal area.  Pt has also noticed some aching in her LT axilla for the past week and has since noticed a mass.  12/22 Bone scan with "Soft tissue uptake within the left breast is noted, nonspecific." Pt hasn't noticed any LT breast masses. Has appt with Dr. Janese Banks in 4 days. Currently on letrozole and ibrance.    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   OSA on CPAP 81/19/1478   Non-alcoholic fatty liver disease 03/06/2017   Rosacea    GERD (gastroesophageal reflux disease)    Central serous retinopathy    IBS (irritable bowel syndrome)    Hyperlipidemia, mixed 01/24/2017   Dyspnea on effort 12/22/2016   Anxiety, generalized 04/29/2016   Diabetes mellitus without complication ([place]) 29/56/2130   Cancer of the skin, basal cell 03/12/2016   Contact dermatitis 04/23/2013   Nodule, subcutaneous 07/24/2012   Localized swelling, mass and lump, unspecified 07/24/2012   Hypertension 12/20/2011   Personal history of breast cancer 12/20/2011   Obesity 12/20/2011    Past  Surgical History:  Procedure Laterality Date   ABDOMINAL HYSTERECTOMY     BREAST BIOPSY Left 08/2006   benign fibroadenoma   BREAST BIOPSY Right 08/11/2020   [place] bx of LN, hydromarker, Invasive mammary carcinoma   BREAST EXCISIONAL BIOPSY Right 02/26/2008   right breast invasive mam ca with rad partial mastectomy    BREAST SURGERY Right    x2/ Dr Tamala Julian   CARDIAC CATHETERIZATION  2006   CESAREAN SECTION  04/02/1998   CHOLECYSTECTOMY  04/2010   Dr. Smith/ laprascopic surgery   COLONOSCOPY  04/04/2000   COLONOSCOPY WITH PROPOFOL N/A 02/12/2019   Procedure: COLONOSCOPY WITH PROPOFOL;  Surgeon: Lollie Sails, Jacqueline Deleon;  Location: [facility] ENDOSCOPY;  Service: Endoscopy;  Laterality: N/A;   ESOPHAGOGASTRODUODENOSCOPY (EGD) WITH PROPOFOL N/A 05/01/2015   Procedure: ESOPHAGOGASTRODUODENOSCOPY (EGD) WITH PROPOFOL;  Surgeon: Hulen Luster, Jacqueline Deleon;  Location: [facility] ENDOSCOPY;  Service: Gastroenterology;  Laterality: N/A;   ESOPHAGOGASTRODUODENOSCOPY (EGD) WITH PROPOFOL N/A 02/12/2019   Procedure: ESOPHAGOGASTRODUODENOSCOPY (EGD) WITH PROPOFOL;  Surgeon: Lollie Sails, Jacqueline Deleon;  Location: [facility] ENDOSCOPY;  Service: Endoscopy;  Laterality: N/A;   EYE SURGERY  08/2012   laser surgery on retina/ DR Appenzeller   FINGER SURGERY     repair of tendon in right index, Dr. Francesco Sor in [facility]   FOOT SURGERY     Dr. Lisco     right upper back   LAPAROSCOPIC SUPRACERVICAL HYSTERECTOMY  06/2007  Dr Kincius/ adhesions/CPP/adenomyosis   MASTECTOMY PARTIAL / LUMPECTOMY Right 01/2008   with sentinal lymph node biopsy/ infiltrating ductal cancer   REPAIR PERONEAL TENDONS ANKLE  10/2019   with tarsal exostectomy and sural neuroma excision on right    SKIN CANCER EXCISION     back and calves   WISDOM TOOTH EXTRACTION  1991    Family History  Problem Relation Age of Onset   Hypertension Mother    Thyroid disease Mother    Breast cancer Mother 74   Colon cancer Mother 56        again at 67   Atrial fibrillation Mother    Hip fracture Mother    Skin cancer Mother    Stroke Father    Hypertension Father    Cancer Father 86       prostate   Parkinson's disease Father    Skin cancer Father    Hypertension Sister    Thyroid disease Sister    Cancer Sister        skin/ squamous cell   Lung cancer Paternal Aunt 16       smoker   Diabetes Maternal Grandmother    Stroke Maternal Grandfather    Heart disease Paternal Grandfather    Diabetes Maternal Aunt    Lymphoma Maternal Uncle    Thyroid cancer Cousin        maternal   Heart disease Maternal Uncle    Cancer Maternal Aunt    Breast cancer Maternal Aunt 75   Breast cancer Cousin 60       maternal   Breast cancer Cousin 2   Colon cancer Cousin     Social History   Socioeconomic History   Marital status: Married    Spouse name: Richardson Landry   Number of children: 1   Years of education: 14   Highest education level: Not on file  Occupational History   Occupation: CMA  Tobacco Use   Smoking status: Never   Smokeless tobacco: Never  Vaping Use   Vaping Use: Never used  Substance and Sexual Activity   Alcohol use: Yes    Alcohol/week: 0.0 standard drinks    Comment: rare, 1-2 drinks per year   Drug use: No   Sexual activity: Yes    Partners: Male    Birth control/protection: Surgical    Comment: Hysterectomy   Other Topics Concern   Not on file  Social History Narrative   Lives in [place] with husband, no pets.  Works at [employer].      Diet - regular   Exercise - occasional   Social Determinants of Health   Financial Resource Strain: Not on file  Food Insecurity: Not on file  Transportation Needs: Not on file  Physical Activity: Not on file  Stress: Not on file  Social Connections: Not on file  Intimate Partner Violence: Not on file    Outpatient Medications Prior to Visit  Medication Sig Dispense Refill   acetaminophen (TYLENOL) 325 MG tablet Take 650 mg by mouth every 6 (six)  hours as needed for mild pain or headache.     buPROPion (WELLBUTRIN XL) 300 MG 24 hr tablet Take 1 tablet by mouth daily.     citalopram (CELEXA) 40 MG tablet Take 1 tablet (40 mg total) by mouth daily. 90 tablet 1   clotrimazole (LOTRIMIN) 1 % cream Apply 1 application topically daily. groin     clotrimazole-betamethasone (LOTRISONE) cream APPLY EXTERNALLY TWICE A DAY FOR 2 WEEKS  45 g 0   diphenhydrAMINE (BENADRYL) 25 MG tablet Take 25 mg by mouth at bedtime as needed for sleep.     gabapentin (NEURONTIN) 100 MG capsule Take 1 capsule (100 mg total) by mouth 3 (three) times daily 270 capsule 1   ketotifen (ZADITOR) 0.025 % ophthalmic solution Place 1 drop into both eyes daily.     letrozole (FEMARA) 2.5 MG tablet TAKE 1 TABLET (2.5 MG TOTAL) BY MOUTH DAILY. 90 tablet 4   lisinopril (ZESTRIL) 10 MG tablet Take 1 tablet (10 mg total) by mouth once daily 90 tablet 1   metFORMIN (GLUCOPHAGE-XR) 500 MG 24 hr tablet TAKE 1 TABLET BY MOUTH TWICE DAILY WITH A MEAL 180 tablet 1   metFORMIN (GLUCOPHAGE-XR) 500 MG 24 hr tablet Take 1 tablet (500 mg total) by mouth twice a day with meals. 180 tablet 1   Multiple Vitamin (MULTIVITAMIN) tablet Take 1 tablet by mouth daily.     omeprazole (PRILOSEC) 20 MG capsule TAKE 1 CAPSULE BY MOUTH 2 TIMES DAILY TAKE 30 MIN BEFORE MEALS. 180 capsule 1   ondansetron (ZOFRAN) 4 MG tablet Take 1 tablet (4 mg total) by mouth every 8 (eight) hours as needed for nausea or vomiting. 30 tablet 0   palbociclib (IBRANCE) 75 MG tablet Take 1 tablet (75 mg total) by mouth daily. Take for 21 days on, 7 days off, repeat every 28 days. 21 tablet 1   Zoster Vaccine Adjuvanted [facility]) injection Inject into the muscle. 0.5 mL 1   diltiazem (CARDIZEM CD) 120 MG 24 hr capsule TAKE 1 CAPSULE BY MOUTH DAILY. 30 capsule 10   No facility-administered medications prior to visit.      ROS:  Review of Systems  Constitutional:  Negative for fever.  Gastrointestinal:  Negative for blood  in stool, constipation, diarrhea, nausea and vomiting.  Genitourinary:  Negative for dyspareunia, dysuria, flank pain, frequency, hematuria, urgency, vaginal bleeding, vaginal discharge and vaginal pain.  Musculoskeletal:  Negative for back pain.  Skin:  Positive for wound. Negative for rash.  Hematological:  Positive for adenopathy.  BREAST: No symptoms   OBJECTIVE:   Vitals:  BP 130/80    Ht 5\' 3"  (1.6 m)    Wt 276 lb (125.2 kg)    BMI 48.89 kg/m   Physical Exam Vitals reviewed.  Constitutional:      Appearance: She is well-developed.  Pulmonary:     Effort: Pulmonary effort is normal.  Chest:    Musculoskeletal:        General: Normal range of motion.     Cervical back: Normal range of motion.  Lymphadenopathy:     Upper Body:     Left upper body: Axillary adenopathy present.  Skin:    General: Skin is warm and dry.     Findings: Erythema, lesion and wound present.          Comments: BILAT INGUINAL AREAS WITH PETECHIAE AND PURPURA; LT ING AREA WITH 2 ULCERATIVE/OOZING LESIONS ON INGUINAL CREASE; ERYTHEMATOUS BASE; TENDER TO TOUCH; NO BLEEDING  Neurological:     General: No focal deficit present.     Mental Status: She is alert and oriented to person, place, and time.     Cranial Nerves: No cranial nerve deficit.  Psychiatric:        Mood and Affect: Mood normal.        Behavior: Behavior normal.        Thought Content: Thought content normal.  Judgment: Judgment normal.    Assessment/Plan: Axillary mass, left - Plan: US BREAST LTD UNI LEFT INC AXILLA; check LT breast u/s tomorrow. Will f/u with results.   Recurrent breast cancer, right ([place]) - Plan: US BREAST LTD UNI LEFT INC AXILLA  Skin infection - Plan: cephALEXin (KEFLEX) 500 MG capsule, HSV NAA, Anaerobic and Aerobic Culture; Rx keflex (has sulfa and amox allergy), check HSV and aerobic/anaerobic culture. Cont triple abx oint for now. Will f/u with results. D/C lotrisone crm (can't use steroid for more  than 2 wks).   Meds ordered this encounter  Medications   cephALEXin (KEFLEX) 500 MG capsule    Sig: Take 1 capsule (500 mg total) by mouth 2 (two) times daily for 10 days.    Dispense:  20 capsule    Refill:  0    Order Specific Question:   Supervising Provider    Answer:   Gae Dry [[id number]]      Return if symptoms worsen or fail to improve.  Ryden Wainer B. Samil Mecham, PA-C 08/10/2021 3:49 PM

## 2021-08-11 ENCOUNTER — Ambulatory Visit
Admission: RE | Admit: 2021-08-11 | Discharge: 2021-08-11 | Disposition: A | Discharge status: Home / Self Care | Payer: 59 | Source: Ambulatory Visit | Attending: Obstetrics and Gynecology | Admitting: Obstetrics and Gynecology

## 2021-08-11 ENCOUNTER — Other Ambulatory Visit: Payer: Self-pay | Admitting: Obstetrics and Gynecology

## 2021-08-11 DIAGNOSIS — R922 Inconclusive mammogram: Secondary | ICD-10-CM | POA: Diagnosis not present

## 2021-08-11 DIAGNOSIS — C50911 Malignant neoplasm of unspecified site of right female breast: Secondary | ICD-10-CM

## 2021-08-11 DIAGNOSIS — R2232 Localized swelling, mass and lump, left upper limb: Secondary | ICD-10-CM | POA: Insufficient documentation

## 2021-08-12 ENCOUNTER — Encounter: Payer: Self-pay | Admitting: *Deleted

## 2021-08-12 ENCOUNTER — Other Ambulatory Visit: Payer: Self-pay

## 2021-08-12 MED ORDER — MUPIROCIN 2 % EX OINT
1.0000 "application " | TOPICAL_OINTMENT | Freq: Two times a day (BID) | CUTANEOUS | 0 refills | Status: DC
  Filled 2021-08-12: qty 22, 7d supply, fill #0

## 2021-08-12 NOTE — Addendum Note (Signed)
Addended by: Ardeth Perfect B on: 12/13/2901 02:23 PM   Modules accepted: Orders

## 2021-08-13 ENCOUNTER — Other Ambulatory Visit (HOSPITAL_COMMUNITY): Payer: Self-pay

## 2021-08-13 LAB — HSV NAA
HSV 1 NAA: NEGATIVE
HSV 2 NAA: NEGATIVE

## 2021-08-14 ENCOUNTER — Inpatient Hospital Stay (HOSPITAL_BASED_OUTPATIENT_CLINIC_OR_DEPARTMENT_OTHER): Payer: 59 | Admitting: Oncology

## 2021-08-14 ENCOUNTER — Other Ambulatory Visit: Payer: Self-pay

## 2021-08-14 ENCOUNTER — Inpatient Hospital Stay: Payer: 59 | Attending: Oncology

## 2021-08-14 ENCOUNTER — Inpatient Hospital Stay: Payer: 59

## 2021-08-14 VITALS — BP 134/75 | HR 93 | Temp 99.4°F | Resp 18 | Wt 276.0 lb

## 2021-08-14 DIAGNOSIS — Z79899 Other long term (current) drug therapy: Secondary | ICD-10-CM

## 2021-08-14 DIAGNOSIS — Z79811 Long term (current) use of aromatase inhibitors: Secondary | ICD-10-CM | POA: Diagnosis not present

## 2021-08-14 DIAGNOSIS — Z5181 Encounter for therapeutic drug level monitoring: Secondary | ICD-10-CM | POA: Diagnosis not present

## 2021-08-14 DIAGNOSIS — C50911 Malignant neoplasm of unspecified site of right female breast: Secondary | ICD-10-CM | POA: Insufficient documentation

## 2021-08-14 DIAGNOSIS — C50919 Malignant neoplasm of unspecified site of unspecified female breast: Secondary | ICD-10-CM

## 2021-08-14 DIAGNOSIS — Z7983 Long term (current) use of bisphosphonates: Secondary | ICD-10-CM | POA: Diagnosis not present

## 2021-08-14 DIAGNOSIS — C7951 Secondary malignant neoplasm of bone: Secondary | ICD-10-CM | POA: Diagnosis not present

## 2021-08-14 DIAGNOSIS — Z170 Estrogen receptor positive status [ER+]: Secondary | ICD-10-CM | POA: Diagnosis not present

## 2021-08-14 LAB — CBC WITH DIFFERENTIAL/PLATELET
Abs Immature Granulocytes: 0.01 10*3/uL (ref 0.00–0.07)
Basophils Absolute: 0 10*3/uL (ref 0.0–0.1)
Basophils Relative: 1 %
Eosinophils Absolute: 0.1 10*3/uL (ref 0.0–0.5)
Eosinophils Relative: 3 %
HCT: 37.6 % (ref 36.0–46.0)
Hemoglobin: 12.2 g/dL (ref 12.0–15.0)
Immature Granulocytes: 0 %
Lymphocytes Relative: 30 %
Lymphs Abs: 1 10*3/uL (ref 0.7–4.0)
MCH: 31.4 pg (ref 26.0–34.0)
MCHC: 32.4 g/dL (ref 30.0–36.0)
MCV: 96.7 fL (ref 80.0–100.0)
Monocytes Absolute: 0.4 10*3/uL (ref 0.1–1.0)
Monocytes Relative: 11 %
Neutro Abs: 1.8 10*3/uL (ref 1.7–7.7)
Neutrophils Relative %: 55 %
Platelets: 151 10*3/uL (ref 150–400)
RBC: 3.89 MIL/uL (ref 3.87–5.11)
RDW: 15.9 % — ABNORMAL HIGH (ref 11.5–15.5)
WBC: 3.3 10*3/uL — ABNORMAL LOW (ref 4.0–10.5)
nRBC: 0 % (ref 0.0–0.2)

## 2021-08-14 LAB — COMPREHENSIVE METABOLIC PANEL
ALT: 18 U/L (ref 0–44)
AST: 24 U/L (ref 15–41)
Albumin: 4 g/dL (ref 3.5–5.0)
Alkaline Phosphatase: 84 U/L (ref 38–126)
Anion gap: 11 (ref 5–15)
BUN: 17 mg/dL (ref 6–20)
CO2: 22 mmol/L (ref 22–32)
Calcium: 9 mg/dL (ref 8.9–10.3)
Chloride: 102 mmol/L (ref 98–111)
Creatinine, Ser: 0.98 mg/dL (ref 0.44–1.00)
GFR, Estimated: >60 mL/min (ref >60)
Glucose, Bld: 121 mg/dL — ABNORMAL HIGH (ref 70–99)
Potassium: 4 mmol/L (ref 3.5–5.1)
Sodium: 135 mmol/L (ref 135–145)
Total Bilirubin: 0.2 mg/dL — ABNORMAL LOW (ref 0.3–1.2)
Total Protein: 7.2 g/dL (ref 6.5–8.1)

## 2021-08-14 MED ORDER — ZOLEDRONIC ACID 4 MG/100ML IV SOLN
4.0000 mg | INTRAVENOUS | Status: DC
  Administered 2021-08-14: 4 mg via INTRAVENOUS
  Filled 2021-08-14: qty 100

## 2021-08-14 MED ORDER — SODIUM CHLORIDE 0.9 % IV SOLN
Freq: Once | INTRAVENOUS | Status: AC
  Filled 2021-08-14: qty 250

## 2021-08-14 NOTE — Patient Instructions (Signed)

## 2021-08-14 NOTE — Progress Notes (Signed)
Pt and husband n for follow up. Pt states she has noticed becoming short of breath more often.

## 2021-08-15 LAB — CANCER ANTIGEN 27.29: CA 27.29: 21.4 U/mL (ref 0.0–38.6)

## 2021-08-15 LAB — ANAEROBIC AND AEROBIC CULTURE

## 2021-08-16 ENCOUNTER — Encounter: Payer: Self-pay | Admitting: Oncology

## 2021-08-16 NOTE — Progress Notes (Signed)
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  It was a 1.4 cm tumor with negative lymph nodes grade 1 and patient went on to get lumpectomy followed by adjuvant radiation therapy.  Oncotype DX score was 6 and she did not require adjuvant chemotherapy.  She was on tamoxifen for 5 years.  She had BRCA testing done at that time which was negative.   Patient felt a lump in her right axilla in September 2021.  She underwent ultrasound of bilateral breasts which did not pick up any axillary mass.  A prior screening mammogram in September 2021 was unremarkable.  She was then admitted for Covid pneumonia and underwent CT angio chest on 07/22/2020 which incidentally picked up an axillary mass measuring 2.5 cm.  No obvious breast mass.  Axillary mass was biopsied and was consistent with high-grade invasive mammary carcinoma.  IHC was positive for GATA3 with diffuse strong nuclear staining.  There was no lymph node architecture identified in the sample and it could not be determined if it is a primary tumor within the axillary breast tissue or metastases.  ER strongly positive greater than 90%, PR 1 to 10% positive and HER-2 negative   PET CT scan showed hypermetabolic poorly marginated solid right axillary mass 2.5 x 2 cm with an SUV of 5.6.  No enlarged hypermetabolic mediastinal or hilar lymph nodes no enlarged left axillary  lymph nodes.  Subcentimeter lung nodules below PET resolution.  Small hypermetabolism in the right liver with an SUV of 6 without CT correlate equivocal for liver metastases.  Multiple hypermetabolic faintly lytic osseous lesions throughout the lumbar spine and bilateral pelvic girdle with representative supra-acetabular right iliac bone 1.9 cm lesion, anterior superior left acetabular 1.7 cm lesion and L1 1.2 cm lesion.   NGS testing showed no actionable mutations.  PD-L1 less than 1%.  CHEK2 S422 FS, K3 CA and 1068 FS, PICC 3 CA and 345H, CCN E1 gain, ERB B2 1 P-CSF 1 hour fusion, FGF 19 gain, FGF 3 gain, FGF 4gain Ibrance plus letrozole started in February 2022.  Baseline tumor markers not elevated  routine   Interval history-patient noticed swelling in her left axilla about a week ago and underwent a mammogram which did not show any abnormality.  Subsequently the swelling resolved.  She continues to work full-time.  She does report fatigue after she gets back from work.  Tolerating letrozole well.  ECOG PS- 1 Pain scale- 0  Review of systems- Review of Systems  Constitutional:  Positive for malaise/fatigue. Negative for chills, fever and weight loss.  HENT:  Negative for congestion, ear discharge and nosebleeds.   Eyes:  Negative for blurred vision.  Respiratory:  Negative for cough, hemoptysis, sputum production, shortness of breath and wheezing.   Cardiovascular:  Negative for chest pain, palpitations, orthopnea and claudication.  Gastrointestinal:  Negative for abdominal pain, blood in stool, constipation, diarrhea, heartburn, melena, nausea and vomiting.  Genitourinary:  Negative for dysuria, flank pain, frequency, hematuria and urgency.  Musculoskeletal:  Negative for back pain, joint pain and myalgias.  Skin:  Negative for rash.  Neurological:  Negative for dizziness, tingling, focal weakness, seizures, weakness and headaches.  Endo/Heme/Allergies:  Does not bruise/bleed easily.   Psychiatric/Behavioral:  Negative for depression and suicidal ideas. The patient does not have insomnia.      Allergies  Allergen Reactions   Kiwi Extract Itching and Swelling    The real kiwi fruit   Clorox Nasal Antiseptic [Povidone-Iodine] Other (See Comments)    Throat stricture when using it    Codeine Other (See Comments)    Felt funny.   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   left lower leg   Central serous retinopathy    CHEK2-related breast cancer ([place]) 07/2020   Chicken pox    Depression    Fatty liver    Fatty liver    Gastric polyps    GERD (gastroesophageal reflux disease)    Gestational diabetes    prediabetes out side of having baby   Headache    migraines   Heart murmur    History of abnormal mammogram 08/2006, 01/2008, 03/2009   Hypertension    IBS (irritable bowel syndrome)    Joint disorder    hx of left ankle tendonitis, bursitis, heel spur   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 Smith/ laprascopic surgery   COLONOSCOPY  04/04/2000   COLONOSCOPY WITH PROPOFOL N/A 02/12/2019   Procedure: COLONOSCOPY WITH PROPOFOL;  Surgeon: Lollie Sails, MD;  Location: [facility] ENDOSCOPY;  Service: Endoscopy;  Laterality: N/A;   ESOPHAGOGASTRODUODENOSCOPY (EGD) WITH PROPOFOL N/A 05/01/2015   Procedure: ESOPHAGOGASTRODUODENOSCOPY (EGD) WITH PROPOFOL;  Surgeon: Hulen Luster, MD;  Location: [facility] ENDOSCOPY;  Service: Gastroenterology;  Laterality: N/A;   ESOPHAGOGASTRODUODENOSCOPY (EGD) WITH PROPOFOL N/A 02/12/2019   Procedure: ESOPHAGOGASTRODUODENOSCOPY (EGD) WITH PROPOFOL;  Surgeon: Lollie Sails, MD;  Location: [facility] ENDOSCOPY;  Service: Endoscopy;  Laterality: N/A;   EYE SURGERY  08/2012   laser surgery on retina/ DR Appenzeller   FINGER SURGERY  repair of tendon in right index, Dr. Francesco [facility]   FOOT SURGERY     Dr. Milinda Pointer   IRRIGATION AND DEBRIDEMENT SEBACEOUS CYST     right upper back   LAPAROSCOPIC SUPRACERVICAL HYSTERECTOMY  06/2007   Dr Kincius/ adhesions/CPP/adenomyosis   MASTECTOMY PARTIAL / LUMPECTOMY Right 01/2008   with sentinal lymph node biopsy/ infiltrating ductal cancer   REPAIR PERONEAL TENDONS ANKLE  10/2019   with tarsal exostectomy and sural neuroma excision on right    SKIN CANCER EXCISION     back and calves   WISDOM TOOTH EXTRACTION  1991    Social History   Socioeconomic History   Marital status: Married    Spouse name: Richardson Landry   Number of children: 1   Years of education: 14   Highest education level: Not on file  Occupational History   Occupation: CMA  Tobacco Use   Smoking status: Never   Smokeless tobacco: Never  Vaping Use   Vaping Use: Never used  Substance and Sexual Activity   Alcohol use: Yes    Alcohol/week: 0.0 standard drinks    Comment: rare, 1-2 drinks  per year   Drug use: No   Sexual activity: Yes    Partners: Male    Birth control/protection: Surgical    Comment: Hysterectomy   Other Topics Concern   Not on file  Social History Narrative   Lives in [place] with husband, no pets.  Works at [employer].      Diet - regular   Exercise - occasional   Social Determinants of Health   Financial Resource Strain: Not on file  Food Insecurity: Not on file  Transportation Needs: Not on file  Physical Activity: Not on file  Stress: Not on file  Social Connections: Not on file  Intimate Partner Violence: Not on file    Family History  Problem Relation Age of Onset   Hypertension Mother    Thyroid disease Mother    Breast cancer Mother 71   Colon cancer Mother 26       again at 68   Atrial fibrillation Mother    Hip fracture Mother    Skin cancer Mother    Stroke Father    Hypertension Father    Cancer Father 6       prostate   Parkinson's disease Father    Skin cancer Father    Hypertension Sister    Thyroid disease Sister    Cancer Sister        skin/ squamous cell   Lung cancer Paternal Aunt 80       smoker   Diabetes Maternal Grandmother    Stroke Maternal Grandfather    Heart disease Paternal Grandfather    Diabetes Maternal Aunt    Lymphoma Maternal Uncle    Thyroid cancer Cousin        maternal   Heart disease Maternal Uncle    Cancer Maternal Aunt    Breast cancer Maternal Aunt 75   Breast cancer Cousin 60       maternal   Breast cancer Cousin 43   Colon cancer Cousin      Current Outpatient Medications:    acetaminophen (TYLENOL) 325 MG tablet, Take 650 mg by mouth every 6 (six) hours as needed for mild pain or headache., Disp: , Rfl:    buPROPion (WELLBUTRIN XL) 300 MG 24 hr tablet, Take 1 tablet by mouth daily., Disp: , Rfl:    cephALEXin (KEFLEX) 500 MG  capsule, Take 1 capsule (500 mg total) by mouth 2 (two) times daily for 10 days., Disp: 20 capsule, Rfl: 0   citalopram (CELEXA) 40 MG tablet, Take  1 tablet (40 mg total) by mouth daily., Disp: 90 tablet, Rfl: 1   clotrimazole (LOTRIMIN) 1 % cream, Apply 1 application topically daily. groin, Disp: , Rfl:    clotrimazole-betamethasone (LOTRISONE) cream, APPLY EXTERNALLY TWICE A DAY FOR 2 WEEKS, Disp: 45 g, Rfl: 0   diltiazem (CARDIZEM CD) 120 MG 24 hr capsule, TAKE 1 CAPSULE BY MOUTH DAILY., Disp: 30 capsule, Rfl: 10   diphenhydrAMINE (BENADRYL) 25 MG tablet, Take 25 mg by mouth at bedtime as needed for sleep., Disp: , Rfl:    gabapentin (NEURONTIN) 100 MG capsule, Take 1 capsule (100 mg total) by mouth 3 (three) times daily, Disp: 270 capsule, Rfl: 1   ketotifen (ZADITOR) 0.025 % ophthalmic solution, Place 1 drop into both eyes daily., Disp: , Rfl:    letrozole (FEMARA) 2.5 MG tablet, TAKE 1 TABLET (2.5 MG TOTAL) BY MOUTH DAILY., Disp: 90 tablet, Rfl: 4   lisinopril (ZESTRIL) 10 MG tablet, Take 1 tablet (10 mg total) by mouth once daily, Disp: 90 tablet, Rfl: 1   metFORMIN (GLUCOPHAGE-XR) 500 MG 24 hr tablet, TAKE 1 TABLET BY MOUTH TWICE DAILY WITH A MEAL, Disp: 180 tablet, Rfl: 1   Multiple Vitamin (MULTIVITAMIN) tablet, Take 1 tablet by mouth daily., Disp: , Rfl:    mupirocin ointment (BACTROBAN) 2 %, Apply 1 application topically 2 (two) times daily., Disp: 22 g, Rfl: 0   omeprazole (PRILOSEC) 20 MG capsule, TAKE 1 CAPSULE BY MOUTH 2 TIMES DAILY TAKE 30 MIN BEFORE MEALS., Disp: 180 capsule, Rfl: 1   ondansetron (ZOFRAN) 4 MG tablet, Take 1 tablet (4 mg total) by mouth every 8 (eight) hours as needed for nausea or vomiting., Disp: 30 tablet, Rfl: 0   palbociclib (IBRANCE) 75 MG tablet, Take 1 tablet (75 mg total) by mouth daily. Take for 21 days on, 7 days off, repeat every 28 days., Disp: 21 tablet, Rfl: 1   Zoster Vaccine Adjuvanted [facility]) injection, Inject into the muscle., Disp: 0.5 mL, Rfl: 1  Physical exam:  Vitals:   08/14/21 1348  BP: 134/75  Pulse: 93  Resp: 18  Temp: 99.4 F (37.4 C)  TempSrc: Tympanic  SpO2: 98%   Weight: 276 lb (125.2 kg)   Physical Exam Constitutional:      General: She is not in acute distress. Cardiovascular:     Rate and Rhythm: Normal rate and regular rhythm.     Heart sounds: Normal heart sounds.  Pulmonary:     Effort: Pulmonary effort is normal.     Breath sounds: Normal breath sounds.  Abdominal:     General: Bowel sounds are normal.     Palpations: Abdomen is soft.  Skin:    General: Skin is warm and dry.  Neurological:     Mental Status: She is alert and oriented to person, place, and time.     CMP Latest Ref Rng & Units 08/14/2021  Glucose 70 - 99 mg/dL 121(H)  BUN 6 - 20 mg/dL 17  Creatinine 0.44 - 1.00 mg/dL 0.98  Sodium 135 - 145 mmol/L 135  Potassium 3.5 - 5.1 mmol/L 4.0  Chloride 98 - 111 mmol/L 102  CO2 22 - 32 mmol/L 22  Calcium 8.9 - 10.3 mg/dL 9.0  Total Protein 6.5 - 8.1 g/dL 7.2  Total Bilirubin 0.3 - 1.2 mg/dL 0.2(L)  Alkaline  Phos 38 - 126 U/L 84  AST 15 - 41 U/L 24  ALT 0 - 44 U/L 18   CBC Latest Ref Rng & Units 08/14/2021  WBC 4.0 - 10.5 K/uL 3.3(L)  Hemoglobin 12.0 - 15.0 g/dL 12.2  Hematocrit 36.0 - 46.0 % 37.6  Platelets 150 - 400 K/uL 151    No images are attached to the encounter.  US BREAST LTD UNI LEFT INC AXILLA  Result Date: 08/11/2021 CLINICAL DATA:  61 year old female with known metastatic breast cancer, recurrent on the right-side. Patient presents with left axillary lump and tenderness. Patient is unable to feel the lump today. EXAM: DIGITAL DIAGNOSTIC BILATERAL MAMMOGRAM WITH TOMOSYNTHESIS AND CAD; ULTRASOUND LEFT BREAST LIMITED TECHNIQUE: Bilateral digital diagnostic mammography and breast tomosynthesis was performed. The images were evaluated with computer-aided detection.; Targeted ultrasound examination of the left breast was performed. COMPARISON:  Previous exam(s). ACR Breast Density Category b: There are scattered areas of fibroglandular density. FINDINGS: Mammogram: Right breast: There are stable benign postsurgical  changes. No suspicious mass, distortion, or microcalcifications are identified to suggest presence of malignancy. Left breast: There are stable benign postsurgical changes. A skin BB marks the palpable site of concern reported by the patient in the left axilla. A spot tangential view of this area was performed in addition to standard views. There is no new abnormality in visualized portions of the left axilla. No suspicious mass, distortion, or microcalcifications are identified to suggest presence of malignancy. On physical exam of the left axilla I do not feel a discrete mass or focal area of thickening. There are no obvious skin changes. Ultrasound: Targeted ultrasound performed throughout the left axilla at the site of concern reported by the patient demonstrating no cystic or solid mass. Multiple normal lymph nodes are imaged. IMPRESSION: No mammographic or sonographic evidence of malignancy in the left axilla. RECOMMENDATION: 1. Recommend any further workup of the palpable/painful site in the left axilla be on a clinical basis. 2.  Continue treatment plan for metastatic breast cancer. I have discussed the findings and recommendations with the patient. If applicable, a reminder letter will be sent to the patient regarding the next appointment. BI-RADS CATEGORY  1: Negative. Electronically Signed   By: Audie Pinto M.D.   On: 08/11/2021 16:35  MM DIAG BREAST TOMO BILATERAL  Result Date: 08/11/2021 CLINICAL DATA:  61 year old female with known metastatic breast cancer, recurrent on the right-side. Patient presents with left axillary lump and tenderness. Patient is unable to feel the lump today. EXAM: DIGITAL DIAGNOSTIC BILATERAL MAMMOGRAM WITH TOMOSYNTHESIS AND CAD; ULTRASOUND LEFT BREAST LIMITED TECHNIQUE: Bilateral digital diagnostic mammography and breast tomosynthesis was performed. The images were evaluated with computer-aided detection.; Targeted ultrasound examination of the left breast was  performed. COMPARISON:  Previous exam(s). ACR Breast Density Category b: There are scattered areas of fibroglandular density. FINDINGS: Mammogram: Right breast: There are stable benign postsurgical changes. No suspicious mass, distortion, or microcalcifications are identified to suggest presence of malignancy. Left breast: There are stable benign postsurgical changes. A skin BB marks the palpable site of concern reported by the patient in the left axilla. A spot tangential view of this area was performed in addition to standard views. There is no new abnormality in visualized portions of the left axilla. No suspicious mass, distortion, or microcalcifications are identified to suggest presence of malignancy. On physical exam of the left axilla I do not feel a discrete mass or focal area of thickening. There are no obvious skin changes. Ultrasound: Targeted  ultrasound performed throughout the left axilla at the site of concern reported by the patient demonstrating no cystic or solid mass. Multiple normal lymph nodes are imaged. IMPRESSION: No mammographic or sonographic evidence of malignancy in the left axilla. RECOMMENDATION: 1. Recommend any further workup of the palpable/painful site in the left axilla be on a clinical basis. 2.  Continue treatment plan for metastatic breast cancer. I have discussed the findings and recommendations with the patient. If applicable, a reminder letter will be sent to the patient regarding the next appointment. BI-RADS CATEGORY  1: Negative. Electronically Signed   By: Audie Pinto M.D.   On: 08/11/2021 16:35    Assessment and plan- Patient is a 61 y.o. female with metastatic ER/PR positive HER2 negative breast cancer with bone metastases.  She is here to discuss CT scan results and to receive next dose of Zometa    I have reviewed CT chest abdomen pelvis images as well as bone scan images independently and discussed findings with the patient.  Overall scan showed areas of  stable Bony metastatic disease.  Plan is therefore to continue letrozole along with Ibrance until progression or toxicity.  She has mild leukopenia/neutropenia secondary to [facility] but her Hytop has remained more than 1.  She will therefore continue on present dose of Ibrance 75 mg 3 weeks on and 1 week off.  Patient did undergo ultrasound of the left breast which did not show any evidence of chest wall mass or left axillary adenopathy.  Patient will proceed with Zometa today and receive Zometa in 1 month in 2 months.  I will see her back in 2 months.  Following that we will switch Zometa to every 3 months since she has been receiving it for over a year now.  Patient would be due for a bone density scan in September 2023.  Her baseline density was normal  Visit Diagnosis 1. High risk medication use   2. Use of letrozole (Femara)   3. Metastatic breast cancer ([place])   4. Encounter for monitoring zoledronic acid therapy      Dr. Randa Evens, MD, MPH [facility] at [facility] [phone] 08/16/2021 9:42 AM

## 2021-08-17 ENCOUNTER — Other Ambulatory Visit (HOSPITAL_COMMUNITY): Payer: Self-pay

## 2021-08-17 ENCOUNTER — Encounter: Payer: Self-pay | Admitting: Oncology

## 2021-08-17 ENCOUNTER — Encounter: Payer: Self-pay | Admitting: Cardiology

## 2021-08-17 MED ORDER — DILTIAZEM HCL ER COATED BEADS 120 MG PO CP24
ORAL_CAPSULE | Freq: Every day | ORAL | 2 refills | Status: DC
  Filled 2021-08-17: qty 30, 30d supply, fill #0
  Filled 2021-09-18: qty 60, 60d supply, fill #1

## 2021-08-17 NOTE — Telephone Encounter (Signed)
Requested Prescriptions   Signed Prescriptions Disp Refills   diltiazem (CARDIZEM CD) 120 MG 24 hr capsule 30 capsule 2    Sig: TAKE 1 CAPSULE BY MOUTH DAILY.    Authorizing Provider: Vickie Epley    Ordering User: Raelene Bott, Audray Rumore L

## 2021-08-19 ENCOUNTER — Other Ambulatory Visit (HOSPITAL_COMMUNITY): Payer: Self-pay

## 2021-08-19 MED ORDER — BUPROPION HCL ER (XL) 300 MG PO TB24
ORAL_TABLET | ORAL | 3 refills | Status: DC
  Filled 2021-08-19: qty 90, 90d supply, fill #0
  Filled 2021-11-10: qty 90, 90d supply, fill #1
  Filled 2022-02-09: qty 90, 90d supply, fill #2
  Filled 2022-05-10: qty 90, 90d supply, fill #3

## 2021-08-20 ENCOUNTER — Other Ambulatory Visit (HOSPITAL_COMMUNITY): Payer: Self-pay

## 2021-08-20 MED ORDER — METFORMIN HCL ER 500 MG PO TB24
ORAL_TABLET | Freq: Two times a day (BID) | ORAL | 1 refills | Status: DC
  Filled 2021-08-20 – 2021-11-10 (×2): qty 180, 90d supply, fill #0
  Filled 2022-02-22: qty 180, 90d supply, fill #1

## 2021-08-28 ENCOUNTER — Other Ambulatory Visit (HOSPITAL_COMMUNITY): Payer: Self-pay

## 2021-08-31 ENCOUNTER — Encounter: Payer: Self-pay | Admitting: Oncology

## 2021-09-02 ENCOUNTER — Other Ambulatory Visit: Payer: Self-pay | Admitting: *Deleted

## 2021-09-02 DIAGNOSIS — C50919 Malignant neoplasm of unspecified site of unspecified female breast: Secondary | ICD-10-CM

## 2021-09-07 ENCOUNTER — Other Ambulatory Visit: Payer: Self-pay | Admitting: Oncology

## 2021-09-07 ENCOUNTER — Other Ambulatory Visit (HOSPITAL_COMMUNITY): Payer: Self-pay

## 2021-09-07 DIAGNOSIS — C50919 Malignant neoplasm of unspecified site of unspecified female breast: Secondary | ICD-10-CM

## 2021-09-07 MED ORDER — PALBOCICLIB 75 MG PO TABS
75.0000 mg | ORAL_TABLET | Freq: Every day | ORAL | 1 refills | Status: DC
  Filled 2021-09-07: qty 21, 21d supply, fill #0
  Filled 2021-09-07: qty 21, 28d supply, fill #0
  Filled 2021-09-28: qty 21, 28d supply, fill #1

## 2021-09-07 MED ORDER — ONDANSETRON HCL 4 MG PO TABS
4.0000 mg | ORAL_TABLET | Freq: Three times a day (TID) | ORAL | 0 refills | Status: DC | PRN
  Filled 2021-09-07: qty 30, 10d supply, fill #0

## 2021-09-07 NOTE — Telephone Encounter (Signed)
CBC with Differential/Platelet Order: [id number] Status: Final result    Visible to patient: Yes (seen)    Next appt: 09/11/2021 at 01:00 PM in Oncology ([facility])    Dx: Metastatic breast cancer ([place])    0 Result Notes           Component Ref Range & Units 3 wk ago 1 mo ago 2 mo ago 3 mo ago 4 mo ago 5 mo ago 6 mo ago  WBC 4.0 - 10.5 K/uL 3.3 Low   6.2  3.4 Low   3.1 Low   3.5 Low   2.8 Low   2.9 Low    RBC 3.87 - 5.11 MIL/uL 3.89  3.73 Low   3.80 Low   3.75 Low   3.42 Low   3.60 Low   3.56 Low    Hemoglobin 12.0 - 15.0 g/dL 12.2  12.0  12.5  12.2  11.3 Low   12.0  11.8 Low    HCT 36.0 - 46.0 % 37.6  36.7  38.3  37.4  34.2 Low   35.9 Low   36.2   MCV 80.0 - 100.0 fL 96.7  98.4  100.8 High   99.7  100.0  99.7  101.7 High    MCH 26.0 - 34.0 pg 31.4  32.2  32.9  32.5  33.0  33.3  33.1   MCHC 30.0 - 36.0 g/dL 32.4  32.7  32.6  32.6  33.0  33.4  32.6   RDW 11.5 - 15.5 % 15.9 High   14.9  15.7 High   15.8 High   15.6 High   16.2 High   16.8 High    Platelets 150 - 400 K/uL 151  193  168  156  158  168  155   nRBC 0.0 - 0.2 % 0.0  0.0  0.0  0.0  0.0  0.0  0.0   Neutrophils Relative % % 55  61  58  54  56  56  57   Neutro Abs 1.7 - 7.7 K/uL 1.8  3.8  1.9  1.7  2.0  1.6 Low   1.7   Lymphocytes Relative % 30  19  25  27  27  28  26    Lymphs Abs 0.7 - 4.0 K/uL 1.0  1.1  0.9  0.8  1.0  0.8  0.8   Monocytes Relative % 11  15  14  15  12  12  13    Monocytes Absolute 0.1 - 1.0 K/uL 0.4  0.9  0.5  0.5  0.4  0.4  0.4   Eosinophils Relative % 3  2  2  2  3  3  2    Eosinophils Absolute 0.0 - 0.5 K/uL 0.1  0.1  0.1  0.1  0.1  0.1  0.1   Basophils Relative % 1  1  1  1  1  1  2    Basophils Absolute 0.0 - 0.1 K/uL 0.0  0.1  0.0  0.0  0.1  0.0  0.1   Immature Granulocytes % 0  2  0  1  1  0  0   Abs Immature Granulocytes 0.00 - 0.07 K/uL 0.01  0.09 High  CM  0.01 CM  0.02 CM  0.02 CM  0.01 CM  0.01 CM   Comment: Performed at [facility], [address] [place],  [address]  WBC Morphology         DIFF CONFIRMED  BY MANAUL. OCC LARGE GRANULAR LYMPHOCYTES NOTED.   RBC Morphology        MIXED RBC POPULATION WITH FEW POLYCHROMATOPHILIC RBCS NOTED.   Smear Review        PLATELETS APPEAR ADEQUATE CM   Resulting Agency  [place] [facility] [place] [facility] [place] [facility] [place] [facility] [place] [facility] [place] [facility] [place] [facility]         Specimen Collected: 08/14/21 13:20 Last Resulted: 08/14/21 13:36      Lab Flowsheet    Order Details    View Encounter    Lab and Collection Details    Routing    Result History    View Encounter Conversation      CM=Additional comments      Result Care Coordination   Patient Communication   Add Comments   Seen Back to Top       Other Results from 08/14/2021  Cancer antigen 27.29 Order: [id number] Status: Final result    Visible to patient: Yes (seen)    Next appt: 09/11/2021 at 01:00 PM in Oncology ([facility])    Dx: Metastatic breast cancer ([place])    0 Result Notes         Component Ref Range & Units 3 wk ago 7 mo ago 9 mo ago 10 mo ago 1 yr ago  CA 27.29 0.0 - 38.6 U/mL 21.4  27.6 CM  26.1 CM  27.1 CM  26.4 CM   Comment: (NOTE)  [employer] Immunochemiluminometric Methodology (ICMA)  Values obtained with different assay methods or kits cannot be used  interchangeably. Results cannot be interpreted as absolute evidence  of the presence or absence of malignant disease.  Performed At: [facility]  [address] [place], [place] [id number]  Rush Farmer MD GG:[id number]   Resulting Agency  [facility] [place] [facility] [place] [facility]         Specimen Collected: 08/14/21 13:20 Last Resulted: 08/15/21 07:38      Lab Flowsheet    Order Details    View Encounter    Lab and Collection Details    Routing    Result History    View Encounter Conversation      CM=Additional comments      Result Care Coordination   Patient Communication   Add Comments   Seen Back to Top          Contains abnormal data  Comprehensive metabolic panel Order: [id number] Status: Final result    Visible to patient: Yes (seen)    Next appt: 09/11/2021 at 01:00 PM in Oncology ([facility])    Dx: Metastatic breast cancer ([place])    0 Result Notes           Component Ref Range & Units 3 wk ago 1 mo ago 2 mo ago 3 mo ago 4 mo ago 5 mo ago 6 mo ago  Sodium 135 - 145 mmol/L 135  135  139  136  136  138  137   Potassium 3.5 - 5.1 mmol/L 4.0  4.4  4.3  4.5  3.9  4.5  4.5   Chloride 98 - 111 mmol/L 102  102  105  102  105  106  103   CO2 22 - 32 mmol/L 22  24  23  22  23  23  23    Glucose, Bld 70 - 99 mg/dL 121 High   96 CM  110 High  CM  106 High  CM  116 High  CM  104 High  CM  137 High  CM   Comment: Glucose reference range applies only to samples taken after fasting for at least 8 hours.  BUN 6 - 20 mg/dL 17  20  22  High   19  17  16  15    Creatinine, Ser 0.44 - 1.00 mg/dL 0.98  1.11 High   1.20 High   1.02 High   0.89  0.90  0.99   Calcium 8.9 - 10.3 mg/dL 9.0  8.9  9.4  9.0  8.5 Low   9.3  9.0   Total Protein 6.5 - 8.1 g/dL 7.2  7.4  7.3  7.3  7.0  7.4  7.0   Albumin 3.5 - 5.0 g/dL 4.0  3.9  4.1  4.1  3.9  4.0  3.9   AST 15 - 41 U/L 24  23  20  22  20  25  24    ALT 0 - 44 U/L 18  27  20  23  20  26  20    Alkaline Phosphatase 38 - 126 U/L 84  103  91  91  78  81  77   Total Bilirubin 0.3 - 1.2 mg/dL 0.2 Low   0.4  <0.1 Low   0.4  0.3  0.6  0.7   GFR, Estimated >60 mL/min >60  58 Low  CM  52 Low  CM  >60 CM  >60 CM  >60 CM  >60 CM   Comment: (NOTE)  Calculated using the CKD-EPI Creatinine Equation (2021)   Anion gap 5 - 15 11  9  CM  11 CM  12 CM  8 CM  9 CM  11 CM   Comment: Performed at [facility], [address] [place], [place] [address]  Resulting Agency  [facility] [place] [facility] [place] [facility] [place] [facility] [place] [facility] [place] [facility] [place] [facility]         Specimen Collected: 08/14/21 13:20 Last Resulted: 08/14/21 13:49

## 2021-09-11 ENCOUNTER — Other Ambulatory Visit: Payer: Self-pay

## 2021-09-11 ENCOUNTER — Inpatient Hospital Stay: Payer: 59

## 2021-09-11 ENCOUNTER — Inpatient Hospital Stay: Payer: 59 | Attending: Oncology

## 2021-09-11 VITALS — BP 136/69 | HR 88 | Temp 97.3°F | Resp 18

## 2021-09-11 DIAGNOSIS — C7951 Secondary malignant neoplasm of bone: Secondary | ICD-10-CM | POA: Diagnosis not present

## 2021-09-11 DIAGNOSIS — Z170 Estrogen receptor positive status [ER+]: Secondary | ICD-10-CM | POA: Insufficient documentation

## 2021-09-11 DIAGNOSIS — Z79811 Long term (current) use of aromatase inhibitors: Secondary | ICD-10-CM | POA: Diagnosis not present

## 2021-09-11 DIAGNOSIS — C50911 Malignant neoplasm of unspecified site of right female breast: Secondary | ICD-10-CM | POA: Diagnosis not present

## 2021-09-11 DIAGNOSIS — C50919 Malignant neoplasm of unspecified site of unspecified female breast: Secondary | ICD-10-CM

## 2021-09-11 LAB — CBC WITH DIFFERENTIAL/PLATELET
Abs Immature Granulocytes: 0.02 10*3/uL (ref 0.00–0.07)
Basophils Absolute: 0 10*3/uL (ref 0.0–0.1)
Basophils Relative: 1 %
Eosinophils Absolute: 0.1 10*3/uL (ref 0.0–0.5)
Eosinophils Relative: 2 %
HCT: 37.2 % (ref 36.0–46.0)
Hemoglobin: 12 g/dL (ref 12.0–15.0)
Immature Granulocytes: 1 %
Lymphocytes Relative: 28 %
Lymphs Abs: 1.2 10*3/uL (ref 0.7–4.0)
MCH: 31.4 pg (ref 26.0–34.0)
MCHC: 32.3 g/dL (ref 30.0–36.0)
MCV: 97.4 fL (ref 80.0–100.0)
Monocytes Absolute: 0.4 10*3/uL (ref 0.1–1.0)
Monocytes Relative: 9 %
Neutro Abs: 2.5 10*3/uL (ref 1.7–7.7)
Neutrophils Relative %: 59 %
Platelets: 184 10*3/uL (ref 150–400)
RBC: 3.82 MIL/uL — ABNORMAL LOW (ref 3.87–5.11)
RDW: 16.2 % — ABNORMAL HIGH (ref 11.5–15.5)
WBC: 4.2 10*3/uL (ref 4.0–10.5)
nRBC: 0 % (ref 0.0–0.2)

## 2021-09-11 LAB — COMPREHENSIVE METABOLIC PANEL
ALT: 19 U/L (ref 0–44)
AST: 20 U/L (ref 15–41)
Albumin: 4.3 g/dL (ref 3.5–5.0)
Alkaline Phosphatase: 104 U/L (ref 38–126)
Anion gap: 8 (ref 5–15)
BUN: 23 mg/dL — ABNORMAL HIGH (ref 6–20)
CO2: 24 mmol/L (ref 22–32)
Calcium: 9.3 mg/dL (ref 8.9–10.3)
Chloride: 103 mmol/L (ref 98–111)
Creatinine, Ser: 1.08 mg/dL — ABNORMAL HIGH (ref 0.44–1.00)
GFR, Estimated: 60 mL/min — ABNORMAL LOW (ref >60)
Glucose, Bld: 96 mg/dL (ref 70–99)
Potassium: 4.5 mmol/L (ref 3.5–5.1)
Sodium: 135 mmol/L (ref 135–145)
Total Bilirubin: 0.3 mg/dL (ref 0.3–1.2)
Total Protein: 7.9 g/dL (ref 6.5–8.1)

## 2021-09-11 MED ORDER — ZOLEDRONIC ACID 4 MG/100ML IV SOLN
4.0000 mg | INTRAVENOUS | Status: DC
  Administered 2021-09-11: 4 mg via INTRAVENOUS
  Filled 2021-09-11: qty 100

## 2021-09-11 MED ORDER — GABAPENTIN 100 MG PO CAPS
200.0000 mg | ORAL_CAPSULE | Freq: Three times a day (TID) | ORAL | 3 refills | Status: DC
  Filled 2021-09-11: qty 180, 30d supply, fill #0
  Filled 2021-10-08 – 2021-12-04 (×4): qty 180, 30d supply, fill #1
  Filled 2022-01-01 – 2022-02-16 (×3): qty 180, 30d supply, fill #2
  Filled 2022-03-29: qty 180, 30d supply, fill #3

## 2021-09-11 MED ORDER — SODIUM CHLORIDE 0.9 % IV SOLN
Freq: Once | INTRAVENOUS | Status: AC
  Filled 2021-09-11: qty 250

## 2021-09-11 NOTE — Patient Instructions (Signed)

## 2021-09-11 NOTE — Progress Notes (Signed)
Creatinine 1.08, per Dr. Janese Banks okay to proceed with Zometa  ? ?

## 2021-09-15 ENCOUNTER — Other Ambulatory Visit (HOSPITAL_COMMUNITY): Payer: Self-pay

## 2021-09-18 ENCOUNTER — Other Ambulatory Visit (HOSPITAL_COMMUNITY): Payer: Self-pay

## 2021-09-18 ENCOUNTER — Other Ambulatory Visit: Payer: Self-pay

## 2021-09-18 ENCOUNTER — Ambulatory Visit (INDEPENDENT_AMBULATORY_CARE_PROVIDER_SITE_OTHER): Payer: 59

## 2021-09-18 ENCOUNTER — Other Ambulatory Visit: Payer: Self-pay | Admitting: Obstetrics & Gynecology

## 2021-09-18 DIAGNOSIS — R399 Unspecified symptoms and signs involving the genitourinary system: Secondary | ICD-10-CM

## 2021-09-18 LAB — POCT URINALYSIS DIPSTICK OB
Bilirubin, UA: NEGATIVE
Blood, UA: POSITIVE
Clarity, UA: NEGATIVE
Glucose, UA: NEGATIVE
Ketones, UA: NEGATIVE
Nitrite, UA: NEGATIVE
Spec Grav, UA: 1.015 (ref 1.010–1.025)
Urobilinogen, UA: NEGATIVE E.U./dL — AB
pH, UA: 6.5 (ref 5.0–8.0)

## 2021-09-18 MED ORDER — CIPROFLOXACIN HCL 500 MG PO TABS
500.0000 mg | ORAL_TABLET | Freq: Two times a day (BID) | ORAL | 0 refills | Status: DC
  Filled 2021-09-18 (×2): qty 6, 3d supply, fill #0

## 2021-09-22 LAB — URINE CULTURE

## 2021-09-24 DIAGNOSIS — H35712 Central serous chorioretinopathy, left eye: Secondary | ICD-10-CM | POA: Diagnosis not present

## 2021-09-25 ENCOUNTER — Other Ambulatory Visit (HOSPITAL_COMMUNITY): Payer: Self-pay

## 2021-09-25 ENCOUNTER — Other Ambulatory Visit: Payer: Self-pay | Admitting: Oncology

## 2021-09-25 MED ORDER — ONDANSETRON HCL 4 MG PO TABS
4.0000 mg | ORAL_TABLET | Freq: Three times a day (TID) | ORAL | 0 refills | Status: DC | PRN
  Filled 2021-09-25: qty 30, 10d supply, fill #0

## 2021-09-28 ENCOUNTER — Other Ambulatory Visit (HOSPITAL_COMMUNITY): Payer: Self-pay

## 2021-09-29 ENCOUNTER — Other Ambulatory Visit (HOSPITAL_COMMUNITY): Payer: Self-pay

## 2021-10-02 ENCOUNTER — Other Ambulatory Visit (HOSPITAL_COMMUNITY): Payer: Self-pay

## 2021-10-05 ENCOUNTER — Other Ambulatory Visit (HOSPITAL_COMMUNITY): Payer: Self-pay

## 2021-10-07 ENCOUNTER — Other Ambulatory Visit: Payer: Self-pay | Admitting: *Deleted

## 2021-10-07 DIAGNOSIS — C50919 Malignant neoplasm of unspecified site of unspecified female breast: Secondary | ICD-10-CM

## 2021-10-08 ENCOUNTER — Other Ambulatory Visit: Payer: Self-pay

## 2021-10-09 ENCOUNTER — Other Ambulatory Visit (HOSPITAL_COMMUNITY): Payer: Self-pay

## 2021-10-09 MED ORDER — CITALOPRAM HYDROBROMIDE 40 MG PO TABS
40.0000 mg | ORAL_TABLET | Freq: Every day | ORAL | 1 refills | Status: DC
  Filled 2021-10-09: qty 90, 90d supply, fill #0
  Filled 2022-01-04: qty 90, 90d supply, fill #1

## 2021-10-11 ENCOUNTER — Encounter: Payer: Self-pay | Admitting: Oncology

## 2021-10-13 ENCOUNTER — Inpatient Hospital Stay: Payer: 59 | Attending: Oncology

## 2021-10-13 ENCOUNTER — Inpatient Hospital Stay: Payer: 59

## 2021-10-13 ENCOUNTER — Encounter: Payer: Self-pay | Admitting: Oncology

## 2021-10-13 ENCOUNTER — Inpatient Hospital Stay (HOSPITAL_BASED_OUTPATIENT_CLINIC_OR_DEPARTMENT_OTHER): Payer: 59 | Admitting: Oncology

## 2021-10-13 VITALS — BP 134/74 | HR 86 | Temp 99.4°F | Resp 18 | Wt 275.8 lb

## 2021-10-13 DIAGNOSIS — Z170 Estrogen receptor positive status [ER+]: Secondary | ICD-10-CM | POA: Diagnosis not present

## 2021-10-13 DIAGNOSIS — C50919 Malignant neoplasm of unspecified site of unspecified female breast: Secondary | ICD-10-CM

## 2021-10-13 DIAGNOSIS — C7951 Secondary malignant neoplasm of bone: Secondary | ICD-10-CM | POA: Insufficient documentation

## 2021-10-13 DIAGNOSIS — Z7983 Long term (current) use of bisphosphonates: Secondary | ICD-10-CM

## 2021-10-13 DIAGNOSIS — C50911 Malignant neoplasm of unspecified site of right female breast: Secondary | ICD-10-CM | POA: Insufficient documentation

## 2021-10-13 DIAGNOSIS — Z79811 Long term (current) use of aromatase inhibitors: Secondary | ICD-10-CM | POA: Insufficient documentation

## 2021-10-13 DIAGNOSIS — Z79899 Other long term (current) drug therapy: Secondary | ICD-10-CM

## 2021-10-13 DIAGNOSIS — Z5181 Encounter for therapeutic drug level monitoring: Secondary | ICD-10-CM | POA: Diagnosis not present

## 2021-10-13 LAB — COMPREHENSIVE METABOLIC PANEL
ALT: 21 U/L (ref 0–44)
AST: 23 U/L (ref 15–41)
Albumin: 4.2 g/dL (ref 3.5–5.0)
Alkaline Phosphatase: 88 U/L (ref 38–126)
Anion gap: 8 (ref 5–15)
BUN: 18 mg/dL (ref 6–20)
CO2: 21 mmol/L — ABNORMAL LOW (ref 22–32)
Calcium: 9 mg/dL (ref 8.9–10.3)
Chloride: 106 mmol/L (ref 98–111)
Creatinine, Ser: 0.88 mg/dL (ref 0.44–1.00)
GFR, Estimated: >60 mL/min (ref >60)
Glucose, Bld: 88 mg/dL (ref 70–99)
Potassium: 4.4 mmol/L (ref 3.5–5.1)
Sodium: 135 mmol/L (ref 135–145)
Total Bilirubin: 0.4 mg/dL (ref 0.3–1.2)
Total Protein: 7.5 g/dL (ref 6.5–8.1)

## 2021-10-13 LAB — CBC WITH DIFFERENTIAL/PLATELET
Abs Immature Granulocytes: 0.04 10*3/uL (ref 0.00–0.07)
Basophils Absolute: 0.1 10*3/uL (ref 0.0–0.1)
Basophils Relative: 1 %
Eosinophils Absolute: 0.1 10*3/uL (ref 0.0–0.5)
Eosinophils Relative: 2 %
HCT: 37.1 % (ref 36.0–46.0)
Hemoglobin: 12 g/dL (ref 12.0–15.0)
Immature Granulocytes: 1 %
Lymphocytes Relative: 26 %
Lymphs Abs: 1.2 10*3/uL (ref 0.7–4.0)
MCH: 31.2 pg (ref 26.0–34.0)
MCHC: 32.3 g/dL (ref 30.0–36.0)
MCV: 96.4 fL (ref 80.0–100.0)
Monocytes Absolute: 0.8 10*3/uL (ref 0.1–1.0)
Monocytes Relative: 17 %
Neutro Abs: 2.4 10*3/uL (ref 1.7–7.7)
Neutrophils Relative %: 53 %
Platelets: 199 10*3/uL (ref 150–400)
RBC: 3.85 MIL/uL — ABNORMAL LOW (ref 3.87–5.11)
RDW: 16.8 % — ABNORMAL HIGH (ref 11.5–15.5)
WBC: 4.5 10*3/uL (ref 4.0–10.5)
nRBC: 0.4 % — ABNORMAL HIGH (ref 0.0–0.2)

## 2021-10-13 MED ORDER — SODIUM CHLORIDE 0.9 % IV SOLN
INTRAVENOUS | Status: DC
  Filled 2021-10-13: qty 250

## 2021-10-13 MED ORDER — ZOLEDRONIC ACID 4 MG/100ML IV SOLN
4.0000 mg | INTRAVENOUS | Status: DC
  Administered 2021-10-13: 4 mg via INTRAVENOUS
  Filled 2021-10-13: qty 100

## 2021-10-13 NOTE — Progress Notes (Signed)
Pt states her left ring finger at times "is not able to strech out" ?

## 2021-10-18 ENCOUNTER — Encounter: Payer: Self-pay | Admitting: Oncology

## 2021-10-18 NOTE — Progress Notes (Signed)
? ? ? ?Hematology/Oncology Consult note ?[place]  ?[phone] [phone] ? ?Patient Care Team: ?Derinda Late, MD as PCP - General (Family Medicine) ?Vickie Epley, MD as PCP - Electrophysiology (Cardiology)  ? ?Name of the patient: Jacqueline Deleon  ?[id number]  ?08/07/1960  ? ?Date of visit: 10/18/21 ? 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 ?Chief complaint/ Reason for visit-routine follow-up of breast cancer and to receive next dose of Zometa ? ?Heme/Onc history: Patient is a 61 year old female with a past medical history significant for stage I right breast cancer diagnosed in 2009.  It was a 1.4 cm tumor with negative lymph nodes grade 1 and patient went on to get lumpectomy followed by adjuvant radiation therapy.  Oncotype DX score was 6 and she did not require adjuvant chemotherapy.  She was on tamoxifen for 5 years.  She had BRCA testing done at that time which was negative. ?  ?Patient felt a lump in her right axilla in September 2021.  She underwent ultrasound of bilateral breasts which did not pick up any axillary mass.  A prior screening mammogram in September 2021 was unremarkable.  She was then admitted for Covid pneumonia and underwent CT angio chest on 07/22/2020 which incidentally picked up an axillary mass measuring 2.5 cm.  No obvious breast mass.  Axillary mass was biopsied and was consistent with high-grade invasive mammary carcinoma.  IHC was positive for GATA3 with diffuse strong nuclear staining.  There was no lymph node architecture identified in the sample and it could not be determined if it is a primary tumor within the axillary breast tissue or metastases.  ER strongly positive greater than 90%, PR 1 to 10% positive and HER-2 negative ?  ?PET CT scan showed hypermetabolic poorly marginated solid right axillary mass 2.5 x 2 cm with an SUV of 5.6.  No enlarged hypermetabolic mediastinal or hilar lymph nodes no  enlarged left axillary lymph nodes.  Subcentimeter lung nodules below PET resolution.  Small hypermetabolism in the right liver with an SUV of 6 without CT correlate equivocal for liver metastases.  Multiple hypermetabolic faintly lytic osseous lesions throughout the lumbar spine and bilateral pelvic girdle with representative supra-acetabular right iliac bone 1.9 cm lesion, anterior superior left acetabular 1.7 cm lesion and L1 1.2 cm lesion. ?  ?NGS testing showed no actionable mutations.  PD-L1 less than 1%.  CHEK2 S422 FS, K3 CA and 1068 FS, PICC 3 CA and 345H, CCN E1 gain, ERB B2 1 P-CSF 1 hour fusion, FGF 19 gain, FGF 3 gain, FGF 4gain Ibrance plus letrozole started in February 2022.  Baseline tumor markers not elevated ? routine  ?  ? ?Interval history-she is tolerating Ibrance at 75 mg 3 weeks on and 1 week off well without significant side effects.  She is also taking letrozole along with calcium and vitamin D.  She has some good days and bad days when she feels significantly fatigued but otherwise denies any specific complaints at this time ? ?ECOG PS- 1 ?Pain scale- 0 ?Opioid associated constipation- no ? ?Review of systems- Review of Systems  ?Constitutional:  Positive for malaise/fatigue. Negative for chills, fever and weight loss.  ?HENT:  Negative for congestion, ear discharge and nosebleeds.   ?Eyes:  Negative for blurred vision.  ?Respiratory:  Negative for cough, hemoptysis, sputum production, shortness of breath and wheezing.   ?Cardiovascular:  Negative for chest pain, palpitations, orthopnea and claudication.  ?Gastrointestinal:  Negative for  abdominal pain, blood in stool, constipation, diarrhea, heartburn, melena, nausea and vomiting.  ?Genitourinary:  Negative for dysuria, flank pain, frequency, hematuria and urgency.  ?Musculoskeletal:  Negative for back pain, joint pain and myalgias.  ?Skin:  Negative for rash.  ?Neurological:  Negative for dizziness, tingling, focal weakness, seizures,  weakness and headaches.  ?Endo/Heme/Allergies:  Does not bruise/bleed easily.  ?Psychiatric/Behavioral:  Negative for depression and suicidal ideas. The patient does not have insomnia.    ? ? ?Allergies  ?Allergen Reactions  ? Kiwi Extract Itching and Swelling  ?  The real kiwi fruit  ? Clorox Nasal Antiseptic [Povidone-Iodine] Other (See Comments)  ?  Throat stricture when using it   ? Codeine Other (See Comments)  ?  Felt funny.  ? Grapeseed Extract [Nutritional Supplements] Itching and Swelling  ?  Raw Grapes only if cooked it is ok  ? Amoxicillin Rash  ? Erythromycin Rash  ? Sulfa Antibiotics Rash  ? Tape Rash  ?  Adhesives  Pt can have paper tape  ? ? ? ?Past Medical History:  ?Diagnosis Date  ? Allergic genetic state   ? Arthritis   ? BRCA negative 2004  ? Breast cancer [facility])   ? 2009 infiltrating ductal cancer of right breast  ? Breast mass 08/2006, 03/2009  ? left breast biopsy fibroadenoma (2008) right breast biopsy benign (2010)  ? 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 left lower leg  ? Central serous retinopathy   ? 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 Chicken pox   ? Depression   ? Fatty liver   ? Fatty liver   ? Gastric polyps   ? GERD (gastroesophageal reflux disease)   ? Gestational diabetes   ? prediabetes out side of having baby  ? Headache   ? migraines  ? Heart murmur   ? History of abnormal mammogram 08/2006, 01/2008, 03/2009  ? Hypertension   ? IBS (irritable bowel syndrome)   ? Joint disorder   ? hx of left ankle tendonitis, bursitis, heel spur  ? 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 Obesity 2018  ? BMI 45  ? Pelvic pain   ? adhesions and adenomyosis  ? Personal history of radiation therapy   ? Rosacea   ? ? ? ?Past Surgical History:  ?Procedure Laterality Date  ? ABDOMINAL HYSTERECTOMY    ? BREAST BIOPSY Left 08/2006  ? benign fibroadenoma  ? BREAST BIOPSY Right 08/11/2020  ? [place] bx of LN, hydromarker, Invasive mammary carcinoma   ? BREAST EXCISIONAL BIOPSY Right 02/26/2008  ? right breast invasive mam ca with rad partial mastectomy   ? BREAST SURGERY Right   ? x2/ Dr Tamala Julian  ? CARDIAC CATHETERIZATION  2006  ? CESAREAN SECTION  04/02/1998  ? CHOLECYSTECTOMY  04/2010  ? Dr. Smith/ laprascopic surgery  ? COLONOSCOPY  04/04/2000  ? COLONOSCOPY WITH PROPOFOL N/A 02/12/2019  ? Procedure: COLONOSCOPY WITH PROPOFOL;  Surgeon: Lollie Sails, MD;  Location[facility]COPY;  Service: Endoscopy;  Laterality: N/A;  ? ESOPHAGOGASTRODUODENOSCOPY (EGD) WITH PROPOFOL N/A 05/01/2015  ? Procedure: ESOPHAGOGASTRODUODENOSCOPY (EGD) WITH PROPOFOL;  Surgeon: Hulen Luster, MD;  Location[facility] ENDOSCOPY;  Service: Gastroenterology;  Laterality: N/A;  ? ESOPHAGOGASTRODUODENOSCOPY (EGD) WITH PROPOFOL N/A 02/12/2019  ? Procedure: ESOPHAGOGASTRODUODENOSCOPY (EGD) WITH PROPOFOL;  Surgeon: Lollie Sails, MD;  Location: [facility] ENDOSCOPY;  Service: Endoscopy;  Laterality: N/A;  ? EYE SURGERY  08/2012  ?  laser surgery on retina/ DR Appenzeller  ? FINGER SURGERY    ? repair of tendon in right index, Dr. Francesco Sor in [place]  ? FOOT SURGERY    ? Dr. Milinda Pointer  ? IRRIGATION AND DEBRIDEMENT SEBACEOUS CYST    ? right upper back  ? LAPAROSCOPIC SUPRACERVICAL HYSTERECTOMY  06/2007  ? Dr Kincius/ adhesions/CPP/adenomyosis  ? MASTECTOMY PARTIAL / LUMPECTOMY Right 01/2008  ? with sentinal lymph node biopsy/ infiltrating ductal cancer  ? REPAIR PERONEAL TENDONS ANKLE  10/2019  ? with tarsal exostectomy and sural neuroma excision on right   ? SKIN CANCER EXCISION    ? back and calves  ? [place] EXTRACTION  1991  ? ? ?Social History  ? ?Socioeconomic History  ? Marital status: Married  ?  Spouse name: Richardson Landry  ? Number of children: 1  ? Years of education: 63  ? Highest education level: Not on file  ?Occupational History  ? Occupation: CMA  ?Tobacco Use  ? Smoking status: Never  ? Smokeless tobacco: Never  ?Vaping Use  ? Vaping Use: Never used  ?Substance and Sexual Activity  ? Alcohol  use: Yes  ?  Alcohol/week: 0.0 standard drinks  ?  Comment: rare, 1-2 drinks per year  ? Drug use: No  ? Sexual activity: Yes  ?  Partners: Male  ?  Birth control/protection: Surgical  ?  Comment: Hysterec

## 2021-10-19 ENCOUNTER — Other Ambulatory Visit: Payer: Self-pay | Admitting: Oncology

## 2021-10-19 ENCOUNTER — Other Ambulatory Visit (HOSPITAL_COMMUNITY): Payer: Self-pay

## 2021-10-19 DIAGNOSIS — C50919 Malignant neoplasm of unspecified site of unspecified female breast: Secondary | ICD-10-CM

## 2021-10-19 MED ORDER — PALBOCICLIB 75 MG PO TABS
75.0000 mg | ORAL_TABLET | Freq: Every day | ORAL | 1 refills | Status: DC
  Filled 2021-10-21: qty 21, 21d supply, fill #0
  Filled 2021-11-16: qty 21, 21d supply, fill #1

## 2021-10-19 NOTE — Telephone Encounter (Signed)
CBC with Differential ?Order: [id number] ?Status: Final result    ?Visible to patient: Yes (seen)    ?Next appt: 11/09/2021 at 10:00 AM in Radiology ([facility])    ?Dx: Primary malignant neoplasm of breast ...    ?0 Result Notes ?          ?Component Ref Range & Units 6 d ago 1 mo ago 2 mo ago 3 mo ago 4 mo ago 5 mo ago 6 mo ago  ?WBC 4.0 - 10.5 K/uL 4.5  4.2  3.3 Low   6.2  3.4 Low   3.1 Low   3.5 Low    ?RBC 3.87 - 5.11 MIL/uL 3.85 Low   3.82 Low   3.89  3.73 Low   3.80 Low   3.75 Low   3.42 Low    ?Hemoglobin 12.0 - 15.0 g/dL 12.0  12.0  12.2  12.0  12.5  12.2  11.3 Low    ?HCT 36.0 - 46.0 % 37.1  37.2  37.6  36.7  38.3  37.4  34.2 Low    ?MCV 80.0 - 100.0 fL 96.4  97.4  96.7  98.4  100.8 High   99.7  100.0   ?MCH 26.0 - 34.0 pg 31.2  31.4  31.4  32.2  32.9  32.5  33.0   ?MCHC 30.0 - 36.0 g/dL 32.3  32.3  32.4  32.7  32.6  32.6  33.0   ?RDW 11.5 - 15.5 % 16.8 High   16.2 High   15.9 High   14.9  15.7 High   15.8 High   15.6 High    ?Platelets 150 - 400 K/uL 199  184  151  193  168  156  158   ?nRBC 0.0 - 0.2 % 0.4 High   0.0  0.0  0.0  0.0  0.0  0.0   ?Neutrophils Relative % % 53  59  55  61  58  54  56   ?Neutro Abs 1.7 - 7.7 K/uL 2.4  2.5  1.8  3.8  1.9  1.7  2.0   ?Lymphocytes Relative % '26  28  30  19  25  27  27   '$ ?Lymphs Abs 0.7 - 4.0 K/uL 1.2  1.2  1.0  1.1  0.9  0.8  1.0   ?Monocytes Relative % '17  9  11  15  14  15  12   '$ ?Monocytes Absolute 0.1 - 1.0 K/uL 0.8  0.4  0.4  0.9  0.5  0.5  0.4   ?Eosinophils Relative % '2  2  3  2  2  2  3   '$ ?Eosinophils Absolute 0.0 - 0.5 K/uL 0.1  0.1  0.1  0.1  0.1  0.1  0.1   ?Basophils Relative % '1  1  1  1  1  1  1   '$ ?Basophils Absolute 0.0 - 0.1 K/uL 0.1  0.0  0.0  0.1  0.0  0.0  0.1   ?Immature Granulocytes % 1  1  0  2  0  1  1   ?Abs Immature Granulocytes 0.00 - 0.07 K/uL 0.04  0.02 CM  0.01 CM  0.09 High  CM  0.01 CM  0.02 CM  0.02 CM   ?Comment: Performed at [facility], [address], [place], [place] [address]  ?Resulting Agency  [place] [facility] [place] [facility]  LAB [place] [facility] [place] [facility] [place] [facility]  [place] [facility] [place] [facility]  ?  ? ?  ?  ?Specimen Collected: 10/13/21 12:24 Last Resulted: 10/13/21 12:41  ?  ?  Lab Flowsheet   ? Order Details   ? View Encounter   ? Lab and Collection Details   ? Routing   ? Result History    ?View Encounter Conversation    ?  ?CM=Additional comments    ?  ?Result Care Coordination ? ? ?Patient Communication ? ? Add Comments   Seen Back to Top  ?  ?  ? ?Other Results from 10/13/2021 ? ? Contains abnormal data Comprehensive metabolic panel ?Order: [id number] ?Status: Final result    ?Visible to patient: Yes (seen)    ?Next appt: 11/09/2021 at 10:00 AM in Radiology ([facility])    ?Dx: Primary malignant neoplasm of breast ...    ?0 Result Notes ?          ?Component Ref Range & Units 6 d ago 1 mo ago 2 mo ago 3 mo ago 4 mo ago 5 mo ago 6 mo ago  ?Sodium 135 - 145 mmol/L 135  135  135  135  139  136  136   ?Potassium 3.5 - 5.1 mmol/L 4.4  4.5  4.0  4.4  4.3  4.5  3.9   ?Chloride 98 - 111 mmol/L 106  103  102  102  105  102  105   ?CO2 22 - 32 mmol/L 21 Low   '24  22  24  23  22  23   '$ ?Glucose, Bld 70 - 99 mg/dL 88  96 CM  121 High  CM  96 CM  110 High  CM  106 High  CM  116 High  CM   ?Comment: Glucose reference range applies only to samples taken after fasting for at least 8 hours.  ?BUN 6 - 20 mg/dL 18  23 High   '17  20  22 '$ High   19  17   ?Creatinine, Ser 0.44 - 1.00 mg/dL 0.88  1.08 High   0.98  1.11 High   1.20 High   1.02 High   0.89   ?Calcium 8.9 - 10.3 mg/dL 9.0  9.3  9.0  8.9  9.4  9.0  8.5 Low    ?Total Protein 6.5 - 8.1 g/dL 7.5  7.9  7.2  7.4  7.3  7.3  7.0   ?Albumin 3.5 - 5.0 g/dL 4.2  4.3  4.0  3.9  4.1  4.1  3.9   ?AST 15 - 41 U/L '23  20  24  23  20  22  20   '$ ?ALT 0 - 44 U/L '21  19  18  27  20  23  20   '$ ?Alkaline Phosphatase 38 - 126 U/L 88  104  84  103  91  91  78   ?Total Bilirubin 0.3 - 1.2 mg/dL 0.4  0.3  0.2 Low   0.4  <0.1 Low   0.4  0.3   ?GFR, Estimated >60 mL/min >60  60 Low  CM  >60 CM  58 Low  CM  52 Low  CM  >60 CM   >60 CM   ?Comment: (NOTE)  ?Calculated using the CKD-EPI Creatinine Equation (2021)   ?Anion gap 5 - '15 8  8 '$ CM  11 CM  9 CM  11 CM  12 CM  8 CM   ?Comment: Performed at [facility],  [address], [place], [place] [address]  ?Resulting Agency  [place] [facility] [place] [facility] [place] [facility] [place] [facility] [place] [facility] [place] [facility] [place] [facility]  ?  ? ?  ?  ?Specimen Collected: 10/13/21 12:24 Last Resulted: 10/13/21 12:53  ?  ?  ? ?

## 2021-10-21 ENCOUNTER — Other Ambulatory Visit (HOSPITAL_COMMUNITY): Payer: Self-pay

## 2021-10-26 ENCOUNTER — Other Ambulatory Visit: Payer: Self-pay

## 2021-10-27 ENCOUNTER — Other Ambulatory Visit: Payer: Self-pay

## 2021-10-30 ENCOUNTER — Other Ambulatory Visit (HOSPITAL_COMMUNITY): Payer: Self-pay

## 2021-10-30 ENCOUNTER — Encounter: Payer: Self-pay | Admitting: Ophthalmology

## 2021-11-09 ENCOUNTER — Encounter
Admission: RE | Admit: 2021-11-09 | Discharge: 2021-11-09 | Disposition: A | Discharge status: Home / Self Care | Payer: 59 | Source: Ambulatory Visit | Attending: Oncology | Admitting: Oncology

## 2021-11-09 ENCOUNTER — Ambulatory Visit
Admission: RE | Admit: 2021-11-09 | Discharge: 2021-11-09 | Disposition: A | Discharge status: Home / Self Care | Payer: 59 | Source: Ambulatory Visit | Attending: Oncology | Admitting: Oncology

## 2021-11-09 DIAGNOSIS — J984 Other disorders of lung: Secondary | ICD-10-CM | POA: Diagnosis not present

## 2021-11-09 DIAGNOSIS — K5730 Diverticulosis of large intestine without perforation or abscess without bleeding: Secondary | ICD-10-CM | POA: Diagnosis not present

## 2021-11-09 DIAGNOSIS — C7951 Secondary malignant neoplasm of bone: Secondary | ICD-10-CM | POA: Diagnosis not present

## 2021-11-09 DIAGNOSIS — C50911 Malignant neoplasm of unspecified site of right female breast: Secondary | ICD-10-CM | POA: Insufficient documentation

## 2021-11-09 DIAGNOSIS — D1771 Benign lipomatous neoplasm of kidney: Secondary | ICD-10-CM | POA: Diagnosis not present

## 2021-11-09 MED ORDER — IOHEXOL 300 MG/ML  SOLN
100.0000 mL | Freq: Once | INTRAMUSCULAR | Status: AC | PRN
  Administered 2021-11-09: 100 mL via INTRAVENOUS

## 2021-11-09 MED ORDER — TECHNETIUM TC 99M MEDRONATE IV KIT
20.0000 | PACK | Freq: Once | INTRAVENOUS | Status: AC | PRN
  Administered 2021-11-09: 20.91 via INTRAVENOUS

## 2021-11-10 ENCOUNTER — Other Ambulatory Visit (HOSPITAL_COMMUNITY): Payer: Self-pay

## 2021-11-10 ENCOUNTER — Other Ambulatory Visit: Payer: Self-pay | Admitting: Medical Oncology

## 2021-11-10 ENCOUNTER — Other Ambulatory Visit: Payer: Self-pay | Admitting: Cardiology

## 2021-11-10 MED ORDER — ONDANSETRON HCL 4 MG PO TABS
4.0000 mg | ORAL_TABLET | Freq: Three times a day (TID) | ORAL | 0 refills | Status: DC | PRN
  Filled 2021-11-10: qty 90, 30d supply, fill #0

## 2021-11-10 MED ORDER — DILTIAZEM HCL ER COATED BEADS 120 MG PO CP24
120.0000 mg | ORAL_CAPSULE | Freq: Every day | ORAL | 0 refills | Status: DC
  Filled 2021-12-03: qty 30, 30d supply, fill #0

## 2021-11-11 DIAGNOSIS — I152 Hypertension secondary to endocrine disorders: Secondary | ICD-10-CM | POA: Diagnosis not present

## 2021-11-11 DIAGNOSIS — E1159 Type 2 diabetes mellitus with other circulatory complications: Secondary | ICD-10-CM | POA: Diagnosis not present

## 2021-11-11 DIAGNOSIS — Z79899 Other long term (current) drug therapy: Secondary | ICD-10-CM | POA: Diagnosis not present

## 2021-11-16 ENCOUNTER — Other Ambulatory Visit (HOSPITAL_COMMUNITY): Payer: Self-pay

## 2021-11-16 ENCOUNTER — Other Ambulatory Visit: Payer: Self-pay

## 2021-11-17 ENCOUNTER — Other Ambulatory Visit: Payer: Self-pay

## 2021-11-17 ENCOUNTER — Inpatient Hospital Stay: Payer: 59 | Attending: Oncology

## 2021-11-17 ENCOUNTER — Inpatient Hospital Stay: Payer: 59

## 2021-11-17 ENCOUNTER — Inpatient Hospital Stay (HOSPITAL_BASED_OUTPATIENT_CLINIC_OR_DEPARTMENT_OTHER): Payer: 59 | Admitting: Oncology

## 2021-11-17 ENCOUNTER — Encounter: Payer: Self-pay | Admitting: Oncology

## 2021-11-17 VITALS — BP 131/69 | HR 78 | Temp 98.1°F | Resp 18 | Wt 276.9 lb

## 2021-11-17 DIAGNOSIS — Z79899 Other long term (current) drug therapy: Secondary | ICD-10-CM

## 2021-11-17 DIAGNOSIS — C50919 Malignant neoplasm of unspecified site of unspecified female breast: Secondary | ICD-10-CM

## 2021-11-17 DIAGNOSIS — Z79811 Long term (current) use of aromatase inhibitors: Secondary | ICD-10-CM | POA: Diagnosis not present

## 2021-11-17 DIAGNOSIS — C7951 Secondary malignant neoplasm of bone: Secondary | ICD-10-CM | POA: Insufficient documentation

## 2021-11-17 DIAGNOSIS — Z170 Estrogen receptor positive status [ER+]: Secondary | ICD-10-CM | POA: Diagnosis not present

## 2021-11-17 DIAGNOSIS — C50911 Malignant neoplasm of unspecified site of right female breast: Secondary | ICD-10-CM

## 2021-11-17 LAB — CBC WITH DIFFERENTIAL/PLATELET
Abs Immature Granulocytes: 0.01 10*3/uL (ref 0.00–0.07)
Basophils Absolute: 0 10*3/uL (ref 0.0–0.1)
Basophils Relative: 1 %
Eosinophils Absolute: 0.1 10*3/uL (ref 0.0–0.5)
Eosinophils Relative: 2 %
HCT: 37.2 % (ref 36.0–46.0)
Hemoglobin: 12.2 g/dL (ref 12.0–15.0)
Immature Granulocytes: 0 %
Lymphocytes Relative: 27 %
Lymphs Abs: 0.9 10*3/uL (ref 0.7–4.0)
MCH: 31.6 pg (ref 26.0–34.0)
MCHC: 32.8 g/dL (ref 30.0–36.0)
MCV: 96.4 fL (ref 80.0–100.0)
Monocytes Absolute: 0.2 10*3/uL (ref 0.1–1.0)
Monocytes Relative: 8 %
Neutro Abs: 2 10*3/uL (ref 1.7–7.7)
Neutrophils Relative %: 62 %
Platelets: 238 10*3/uL (ref 150–400)
RBC: 3.86 MIL/uL — ABNORMAL LOW (ref 3.87–5.11)
RDW: 16.8 % — ABNORMAL HIGH (ref 11.5–15.5)
WBC: 3.2 10*3/uL — ABNORMAL LOW (ref 4.0–10.5)
nRBC: 0 % (ref 0.0–0.2)

## 2021-11-17 LAB — COMPREHENSIVE METABOLIC PANEL
ALT: 20 U/L (ref 0–44)
AST: 22 U/L (ref 15–41)
Albumin: 3.9 g/dL (ref 3.5–5.0)
Alkaline Phosphatase: 90 U/L (ref 38–126)
Anion gap: 8 (ref 5–15)
BUN: 20 mg/dL (ref 6–20)
CO2: 21 mmol/L — ABNORMAL LOW (ref 22–32)
Calcium: 8.4 mg/dL — ABNORMAL LOW (ref 8.9–10.3)
Chloride: 106 mmol/L (ref 98–111)
Creatinine, Ser: 0.94 mg/dL (ref 0.44–1.00)
GFR, Estimated: >60 mL/min (ref >60)
Glucose, Bld: 138 mg/dL — ABNORMAL HIGH (ref 70–99)
Potassium: 4 mmol/L (ref 3.5–5.1)
Sodium: 135 mmol/L (ref 135–145)
Total Bilirubin: 0.7 mg/dL (ref 0.3–1.2)
Total Protein: 7.3 g/dL (ref 6.5–8.1)

## 2021-11-17 NOTE — Progress Notes (Signed)
? ? ? ?Hematology/Oncology Consult note ?[place]  ?[phone] [phone] ? ?Patient Care Team: ?Derinda Late, MD as PCP - General (Family Medicine) ?Vickie Epley, MD as PCP - Electrophysiology (Cardiology)  ? ?Name of the patient: Jacqueline Deleon  ?[id number]  ?08-26-1960  ? ?Date of visit: 11/17/21 ? 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 ?Chief complaint/ Reason for visit-discuss CT scan results and further management ? ?Heme/Onc history: Patient is a 61 year old female with a past medical history significant for stage I right breast cancer diagnosed in 2009.  It was a 1.4 cm tumor with negative lymph nodes grade 1 and patient went on to get lumpectomy followed by adjuvant radiation therapy.  Oncotype DX score was 6 and she did not require adjuvant chemotherapy.  She was on tamoxifen for 5 years.  She had BRCA testing done at that time which was negative. ?  ?Patient felt a lump in her right axilla in September 2021.  She underwent ultrasound of bilateral breasts which did not pick up any axillary mass.  A prior screening mammogram in September 2021 was unremarkable.  She was then admitted for Covid pneumonia and underwent CT angio chest on 07/22/2020 which incidentally picked up an axillary mass measuring 2.5 cm.  No obvious breast mass.  Axillary mass was biopsied and was consistent with high-grade invasive mammary carcinoma.  IHC was positive for GATA3 with diffuse strong nuclear staining.  There was no lymph node architecture identified in the sample and it could not be determined if it is a primary tumor within the axillary breast tissue or metastases.  ER strongly positive greater than 90%, PR 1 to 10% positive and HER-2 negative ?  ?PET CT scan showed hypermetabolic poorly marginated solid right axillary mass 2.5 x 2 cm with an SUV of 5.6.  No enlarged hypermetabolic mediastinal or hilar lymph nodes no enlarged left axillary  lymph nodes.  Subcentimeter lung nodules below PET resolution.  Small hypermetabolism in the right liver with an SUV of 6 without CT correlate equivocal for liver metastases.  Multiple hypermetabolic faintly lytic osseous lesions throughout the lumbar spine and bilateral pelvic girdle with representative supra-acetabular right iliac bone 1.9 cm lesion, anterior superior left acetabular 1.7 cm lesion and L1 1.2 cm lesion. ?  ?NGS testing showed no actionable mutations.  PD-L1 less than 1%.  CHEK2 S422 FS, K3 CA and 1068 FS, PICC 3 CA and 345H, CCN E1 gain, ERB B2 1 P-CSF 1 hour fusion, FGF 19 gain, FGF 3 gain, FGF 4gain. ? ?Ibrance plus letrozole started in February 2022.  Baseline tumor markers not elevated.   ? ?Interval history-patient is doing well on Ibrance plus letrozole.  She has mild baseline fatigue.  Bowel movements are regular.  Denies any significant pain.  Appetite is stable ? ?ECOG PS- 1 ?Pain scale- 0 ?Opioid associated constipation- no ? ?Review of systems- Review of Systems  ?Constitutional:  Positive for malaise/fatigue. Negative for chills, fever and weight loss.  ?HENT:  Negative for congestion, ear discharge and nosebleeds.   ?Eyes:  Negative for blurred vision.  ?Respiratory:  Negative for cough, hemoptysis, sputum production, shortness of breath and wheezing.   ?Cardiovascular:  Negative for chest pain, palpitations, orthopnea and claudication.  ?Gastrointestinal:  Negative for abdominal pain, blood in stool, constipation, diarrhea, heartburn, melena, nausea and vomiting.  ?Genitourinary:  Negative for dysuria, flank pain, frequency, hematuria and urgency.  ?Musculoskeletal:  Negative for back pain, joint pain  and myalgias.  ?Skin:  Negative for rash.  ?Neurological:  Negative for dizziness, tingling, focal weakness, seizures, weakness and headaches.  ?Endo/Heme/Allergies:  Does not bruise/bleed easily.  ?Psychiatric/Behavioral:  Negative for depression and suicidal ideas. The patient does not  have insomnia.    ? ? ? ?Allergies  ?Allergen Reactions  ? Kiwi Extract Itching and Swelling  ?  The real kiwi fruit  ? Clorox Nasal Antiseptic [Povidone-Iodine] Other (See Comments)  ?  Throat stricture when using it   ? Codeine Other (See Comments)  ?  Felt funny.  ? Grapeseed Extract [Nutritional Supplements] Itching and Swelling  ?  Raw Grapes only if cooked it is ok  ? Amoxicillin Rash  ? Erythromycin Rash  ? Sulfa Antibiotics Rash  ? Tape Rash  ?  Adhesives  Pt can have paper tape  ? ? ? ?Past Medical History:  ?Diagnosis Date  ? Allergic genetic state   ? Arthritis   ? BRCA negative 2004  ? Breast cancer [facility])   ? 2009 infiltrating ductal cancer of right breast  ? Breast mass 08/2006, 03/2009  ? left breast biopsy fibroadenoma (2008) right breast biopsy benign (2010)  ? 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 left lower leg  ? Central serous retinopathy   ? CHEK2-related breast cancer ([place]) 07/2020  ? Chicken pox   ? Depression   ? Fatty liver   ? Fatty liver   ? Gastric polyps   ? GERD (gastroesophageal reflux disease)   ? Gestational diabetes   ? prediabetes out side of having baby  ? Headache   ? migraines  ? Heart murmur   ? History of abnormal mammogram 08/2006, 01/2008, 03/2009  ? Hypertension   ? IBS (irritable bowel syndrome)   ? Joint disorder   ? hx of left ankle tendonitis, bursitis, heel spur  ? 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 Obesity 2018  ? BMI 45  ? Pelvic pain   ? adhesions and adenomyosis  ? Personal history of radiation therapy   ? Rosacea   ? ? ? ?Past Surgical History:  ?Procedure Laterality Date  ? ABDOMINAL HYSTERECTOMY    ? BREAST BIOPSY Left 08/2006  ? benign fibroadenoma  ? BREAST BIOPSY Right 08/11/2020  ? [place] bx of LN, hydromarker, Invasive mammary carcinoma  ? BREAST EXCISIONAL BIOPSY Right 02/26/2008  ? right breast invasive mam ca with rad partial mastectomy   ? BREAST SURGERY Right   ? x2/ Dr Tamala Julian  ? CARDIAC  CATHETERIZATION  2006  ? CESAREAN SECTION  04/02/1998  ? CHOLECYSTECTOMY  04/2010  ? Dr. Smith/ laprascopic surgery  ? COLONOSCOPY  04/04/2000  ? COLONOSCOPY WITH PROPOFOL N/A 02/12/2019  ? Procedure: COLONOSCOPY WITH PROPOFOL;  Surgeon: Lollie Sails, MD;  Location[facility] ENDOSCOPY;  Service: Endoscopy;  Laterality: N/A;  ? ESOPHAGOGASTRODUODENOSCOPY (EGD) WITH PROPOFOL N/A 05/01/2015  ? Procedure: ESOPHAGOGASTRODUODENOSCOPY (EGD) WITH PROPOFOL;  Surgeon: Hulen Luster, MD;  Location: [facility] ENDOSCOPY;  Service: Gastroenterology;  Laterality: N/A;  ? ESOPHAGOGASTRODUODENOSCOPY (EGD) WITH PROPOFOL N/A 02/12/2019  ? Procedure: ESOPHAGOGASTRODUODENOSCOPY (EGD) WITH PROPOFOL;  Surgeon: Lollie Sails, MD;  Location: [facility] ENDOSCOPY;  Service: Endoscopy;  Laterality: N/A;  ? EYE SURGERY  08/2012  ? laser surgery on retina/ DR Appenzeller  ? FINGER SURGERY    ? repair of tendon in right index, Dr. Francesco Sor in [place]  ? FOOT SURGERY    ?  Dr. Milinda Pointer  ? IRRIGATION AND DEBRIDEMENT SEBACEOUS CYST    ? right upper back  ? LAPAROSCOPIC SUPRACERVICAL HYSTERECTOMY  06/2007  ? Dr Kincius/ adhesions/CPP/adenomyosis  ? MASTECTOMY PARTIAL / LUMPECTOMY Right 01/2008  ? with sentinal lymph node biopsy/ infiltrating ductal cancer  ? REPAIR PERONEAL TENDONS ANKLE  10/2019  ? with tarsal exostectomy and sural neuroma excision on right   ? SKIN CANCER EXCISION    ? back and calves  ? [place] EXTRACTION  1991  ? ? ?Social History  ? ?Socioeconomic History  ? Marital status: Married  ?  Spouse name: Richardson Landry  ? Number of children: 1  ? Years of education: 31  ? Highest education level: Not on file  ?Occupational History  ? Occupation: CMA  ?Tobacco Use  ? Smoking status: Never  ? Smokeless tobacco: Never  ?Vaping Use  ? Vaping Use: Never used  ?Substance and Sexual Activity  ? Alcohol use: Yes  ?  Alcohol/week: 0.0 standard drinks  ?  Comment: rare, 1-2 drinks per year  ? Drug use: No  ? Sexual activity: Yes  ?  Partners: Male  ?  Birth  control/protection: Surgical  ?  Comment: Hysterectomy   ?Other Topics Concern  ? Not on file  ?Social History Narrative  ? Lives in [place] with husband, no pets.  Works at [employer].  ?   ? Diet - regular  ? Exer

## 2021-11-19 DIAGNOSIS — Z0000 Encounter for general adult medical examination without abnormal findings: Secondary | ICD-10-CM | POA: Diagnosis not present

## 2021-11-19 DIAGNOSIS — I4892 Unspecified atrial flutter: Secondary | ICD-10-CM | POA: Diagnosis not present

## 2021-11-19 DIAGNOSIS — E1122 Type 2 diabetes mellitus with diabetic chronic kidney disease: Secondary | ICD-10-CM | POA: Diagnosis not present

## 2021-11-19 DIAGNOSIS — E1159 Type 2 diabetes mellitus with other circulatory complications: Secondary | ICD-10-CM | POA: Diagnosis not present

## 2021-11-19 DIAGNOSIS — C50919 Malignant neoplasm of unspecified site of unspecified female breast: Secondary | ICD-10-CM | POA: Diagnosis not present

## 2021-11-19 DIAGNOSIS — Z1331 Encounter for screening for depression: Secondary | ICD-10-CM | POA: Diagnosis not present

## 2021-11-19 DIAGNOSIS — I152 Hypertension secondary to endocrine disorders: Secondary | ICD-10-CM | POA: Diagnosis not present

## 2021-11-19 DIAGNOSIS — F325 Major depressive disorder, single episode, in full remission: Secondary | ICD-10-CM | POA: Diagnosis not present

## 2021-11-19 DIAGNOSIS — Z79899 Other long term (current) drug therapy: Secondary | ICD-10-CM | POA: Diagnosis not present

## 2021-11-20 ENCOUNTER — Other Ambulatory Visit (HOSPITAL_COMMUNITY): Payer: Self-pay

## 2021-11-20 ENCOUNTER — Encounter: Payer: Self-pay | Admitting: Oncology

## 2021-11-20 ENCOUNTER — Other Ambulatory Visit: Payer: Self-pay | Admitting: *Deleted

## 2021-11-20 MED ORDER — LETROZOLE 2.5 MG PO TABS
2.5000 mg | ORAL_TABLET | Freq: Every day | ORAL | 3 refills | Status: DC
  Filled 2021-11-20: qty 90, 90d supply, fill #0
  Filled 2022-02-15: qty 90, 90d supply, fill #1
  Filled 2022-05-15: qty 90, 90d supply, fill #2
  Filled 2022-08-13: qty 90, 90d supply, fill #3

## 2021-11-24 ENCOUNTER — Other Ambulatory Visit: Payer: Self-pay

## 2021-11-24 ENCOUNTER — Encounter: Payer: Self-pay | Admitting: Gastroenterology

## 2021-11-24 ENCOUNTER — Other Ambulatory Visit (HOSPITAL_COMMUNITY): Payer: Self-pay

## 2021-11-24 ENCOUNTER — Ambulatory Visit (INDEPENDENT_AMBULATORY_CARE_PROVIDER_SITE_OTHER): Payer: 59 | Admitting: Gastroenterology

## 2021-11-24 VITALS — BP 104/66 | HR 102 | Temp 99.2°F | Ht 63.0 in | Wt 272.1 lb

## 2021-11-24 DIAGNOSIS — R1013 Epigastric pain: Secondary | ICD-10-CM

## 2021-11-24 MED ORDER — OMEPRAZOLE 40 MG PO CPDR
40.0000 mg | DELAYED_RELEASE_CAPSULE | Freq: Two times a day (BID) | ORAL | 2 refills | Status: DC
  Filled 2021-11-24: qty 60, 30d supply, fill #0
  Filled 2021-12-17: qty 60, 30d supply, fill #1
  Filled 2022-01-16: qty 60, 30d supply, fill #2

## 2021-11-24 NOTE — Progress Notes (Signed)
?  ?Cephas Darby, MD ?[address]  ?[address]  ?[place], [place] [address]  ?Main: [phone]  ?Fax: [phone] ? ? ? ?Gastroenterology Consultation ? ?Referring Provider:     Derinda Late, MD ?Primary Care Physician:  Derinda Late, MD ?Primary Gastroenterologist:  Dr. Cephas Darby ?Reason for Consultation:     Chronic epigastric pain, intermittent loose stools ?      ? HPI:   ?Jacqueline Deleon is a 61 y.o. female referred by Dr. Derinda Late, MD  for consultation & management of chronic epigastric pain and intermittent loose stools.  Patient has history of ER positive metastatic stage IV breast cancer on hormonal therapy as well as chemotherapy.  Patient reports that she has been experiencing discomfort in the epigastric area radiating across the bilateral subcostal margin as well as sometimes to back.  She is taking omeprazole 20 mg p.o. twice daily and wondering if she should increase the dose.  She does acknowledge drinking a bottle of soda daily, she loves eating bread, pasta.  She has been gaining weight, gained significant amount of weight within last 1 to 2 years.  Patient also report intermittent loose stools without any bleeding, yesterday and today she has been having 1 formed bowel movement. ? ?Patient works as a Technical brewer at [employer] ? ?NSAIDs: None ? ?Antiplts/Anticoagulants/Anti thrombotics: None ? ?GI Procedures:  ?- Z-line variable. Biopsied. ?- Multiple gastric polyps. Biopsied. ?- Erythematous mucosa in the gastric body. ?- Normal examined duodenum. ?- Biopsies were taken with a cold forceps for histology in the gastric body and in the gastric antrum. ?DIAGNOSIS:  ?A.  STOMACH, ANTRUM; COLD BIOPSY:  ?- CHRONIC AND FOCALLY ACTIVE GASTRITIS.  ?- NEGATIVE FOR H PYLORI.  ?- NEGATIVE FOR INTESTINAL METAPLASIA, DYSPLASIA, AND MALIGNANCY.  ? ?B.  STOMACH, BODY; COLD BIOPSY:  ?- CHRONIC GASTRITIS.  ?- NEGATIVE FOR ACTIVE INFLAMMATION, INTESTINAL METAPLASIA, DYSPLASIA,  ?AND  MALIGNANCY.  ? ?C.  STOMACH POLYP; COLD BIOPSY:  ?- FRAGMENTS OF FUNDIC GLAND POLYP.  ?- NEGATIVE FOR DYSPLASIA AND MALIGNANCY.  ? ?D.  GE J; COLD BIOPSY:  ?- GASTRIC TYPE MUCOSA WITH CHRONIC INFLAMMATION.  ?- NEGATIVE FOR INTESTINAL METAPLASIA, DYSPLASIA, AND MALIGNANCY.  ? ?- Diverticulosis in the sigmoid colon and in the descending colon. ?- Non-bleeding external and internal hemorrhoids. ?- No specimens collected. ? ?Past Medical History:  ?Diagnosis Date  ? Allergic genetic state   ? Arthritis   ? BRCA negative 2004  ? Breast cancer [facility])   ? 2009 infiltrating ductal cancer of right breast  ? Breast mass 08/2006, 03/2009  ? left breast biopsy fibroadenoma (2008) right breast biopsy benign (2010)  ? 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 left lower leg  ? Central serous retinopathy   ? CHEK2-related breast cancer ([place]) 07/2020  ? Chicken pox   ? Depression   ? Fatty liver   ? Fatty liver   ? Gastric polyps   ? GERD (gastroesophageal reflux disease)   ? Gestational diabetes   ? prediabetes out side of having baby  ? Headache   ? migraines  ? Heart murmur   ? History of abnormal mammogram 08/2006, 01/2008, 03/2009  ? Hypertension   ? IBS (irritable bowel syndrome)   ? Joint disorder   ? hx of left ankle tendonitis, bursitis, heel spur  ? 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 Obesity 2018  ? BMI 45  ?  Pelvic pain   ? adhesions and adenomyosis  ? Personal history of radiation therapy   ? Rosacea   ? ? ?Past Surgical History:  ?Procedure Laterality Date  ? ABDOMINAL HYSTERECTOMY    ? BREAST BIOPSY Left 08/2006  ? benign fibroadenoma  ? BREAST BIOPSY Right 08/11/2020  ? [place] bx of LN, hydromarker, Invasive mammary carcinoma  ? BREAST EXCISIONAL BIOPSY Right 02/26/2008  ? right breast invasive mam ca with rad partial mastectomy   ? BREAST SURGERY Right   ? x2/ Dr Tamala Julian  ? CARDIAC CATHETERIZATION  2006  ? CESAREAN SECTION  04/02/1998  ? CHOLECYSTECTOMY  04/2010  ? Dr.  Smith/ laprascopic surgery  ? COLONOSCOPY  04/04/2000  ? COLONOSCOPY WITH PROPOFOL N/A 02/12/2019  ? Procedure: COLONOSCOPY WITH PROPOFOL;  Surgeon: Lollie Sails, MD;  Location: [facility] ENDOSCOPY;  Service: Endoscopy;  Laterality: N/A;  ? ESOPHAGOGASTRODUODENOSCOPY (EGD) WITH PROPOFOL N/A 05/01/2015  ? Procedure: ESOPHAGOGASTRODUODENOSCOPY (EGD) WITH PROPOFOL;  Surgeon: Hulen Luster, MD;  Location: [facility] ENDOSCOPY;  Service: Gastroenterology;  Laterality: N/A;  ? ESOPHAGOGASTRODUODENOSCOPY (EGD) WITH PROPOFOL N/A 02/12/2019  ? Procedure: ESOPHAGOGASTRODUODENOSCOPY (EGD) WITH PROPOFOL;  Surgeon: Lollie Sails, MD;  Location: [facility] ENDOSCOPY;  Service: Endoscopy;  Laterality: N/A;  ? EYE SURGERY  08/2012  ? laser surgery on retina/ DR Appenzeller  ? FINGER SURGERY    ? repair of tendon in right index, Dr. Francesco Sor in [place]  ? FOOT SURGERY    ? Dr. Milinda Pointer  ? IRRIGATION AND DEBRIDEMENT SEBACEOUS CYST    ? right upper back  ? LAPAROSCOPIC SUPRACERVICAL HYSTERECTOMY  06/2007  ? Dr Kincius/ adhesions/CPP/adenomyosis  ? MASTECTOMY PARTIAL / LUMPECTOMY Right 01/2008  ? with sentinal lymph node biopsy/ infiltrating ductal cancer  ? REPAIR PERONEAL TENDONS ANKLE  10/2019  ? with tarsal exostectomy and sural neuroma excision on right   ? SKIN CANCER EXCISION    ? back and calves  ? [place] EXTRACTION  1991  ? ? ?Current Outpatient Medications:  ?  acetaminophen (TYLENOL) 325 MG tablet, Take 650 mg by mouth every 6 (six) hours as needed for mild pain or headache., Disp: , Rfl:  ?  b complex vitamins capsule, Take 1 capsule by mouth daily., Disp: , Rfl:  ?  buPROPion (WELLBUTRIN XL) 300 MG 24 hr tablet, Take 1 tablet (300 mg total) by mouth once daily, Disp: 90 tablet, Rfl: 3 ?  calcium carbonate (OSCAL) 1500 (600 Ca) MG TABS tablet, Take by mouth 2 (two) times daily with a meal., Disp: , Rfl:  ?  Cholecalciferol (VITAMIN D3) 10 MCG (400 UNIT) CAPS, Take by mouth., Disp: , Rfl:  ?  citalopram (CELEXA) 40 MG tablet, Take 1  tablet (40 mg total) by mouth daily., Disp: 90 tablet, Rfl: 1 ?  clotrimazole-betamethasone (LOTRISONE) cream, APPLY EXTERNALLY TWICE A DAY FOR 2 WEEKS, Disp: 45 g, Rfl: 0 ?  diltiazem (CARDIZEM CD) 120 MG 24 hr capsule, Take 1 capsule (120 mg total) by mouth daily., Disp: 30 capsule, Rfl: 0 ?  diphenhydrAMINE (BENADRYL) 25 MG tablet, Take 25 mg by mouth at bedtime as needed for sleep. Pt states daily now for sleep and allergies., Disp: , Rfl:  ?  gabapentin (NEURONTIN) 100 MG capsule, Take 2 capsules (200 mg total) by mouth 3 (three) times daily., Disp: 180 capsule, Rfl: 3 ?  ketotifen (ZADITOR) 0.025 % ophthalmic solution, Place 1 drop into both eyes daily., Disp: , Rfl:  ?  letrozole (FEMARA) 2.5 MG tablet, Take 1  tablet (2.5 mg total) by mouth daily., Disp: 90 tablet, Rfl: 3 ?  lisinopril (ZESTRIL) 10 MG tablet, Take 1 tablet (10 mg total) by mouth once daily, Disp: 90 tablet, Rfl: 1 ?  metFORMIN (GLUCOPHAGE-XR) 500 MG 24 hr tablet, TAKE 1 TABLET BY MOUTH TWICE DAILY WITH A MEAL, Disp: 180 tablet, Rfl: 1 ?  Multiple Vitamin (MULTIVITAMIN) tablet, Take 1 tablet by mouth daily., Disp: , Rfl:  ?  mupirocin ointment (BACTROBAN) 2 %, Apply 1 application topically 2 (two) times daily., Disp: 22 g, Rfl: 0 ?  omeprazole (PRILOSEC) 40 MG capsule, Take 1 capsule by mouth 2 times daily before a meal., Disp: 60 capsule, Rfl: 2 ?  ondansetron (ZOFRAN) 4 MG tablet, Take 1 tablet (4 mg total) by mouth every 8 (eight) hours as needed for nausea or vomiting., Disp: 90 tablet, Rfl: 0 ?  palbociclib (IBRANCE) 75 MG tablet, Take 1 tablet (75 mg total) by mouth daily. Take for 21 days on, 7 days off, repeat every 28 days., Disp: 21 tablet, Rfl: 1 ? ? ? ?Family History  ?Problem Relation Age of Onset  ? Hypertension Mother   ? Thyroid disease Mother   ? Breast cancer Mother 63  ? Colon cancer Mother 48  ?     again at 33  ? Atrial fibrillation Mother   ? Hip fracture Mother   ? Skin cancer Mother   ? Stroke Father   ? Hypertension  Father   ? Cancer Father 16  ?     prostate  ? Parkinson's disease Father   ? Skin cancer Father   ? Hypertension Sister   ? Thyroid disease Sister   ? Cancer Sister   ?     skin/ squamous cell  ? Lung cancer

## 2021-11-25 DIAGNOSIS — R1013 Epigastric pain: Secondary | ICD-10-CM | POA: Diagnosis not present

## 2021-11-26 LAB — H. PYLORI BREATH TEST: H pylori Breath Test: NEGATIVE

## 2021-12-01 ENCOUNTER — Telehealth: Payer: Self-pay

## 2021-12-01 NOTE — Telephone Encounter (Signed)
-----   Message from Lin Landsman, MD sent at 11/30/2021  8:20 PM EDT ----- H. pylori breath test came back negative.  Patient should reach out to [place] in 1 to 2 months if her symptoms are persistent  RV

## 2021-12-01 NOTE — Progress Notes (Unsigned)
Electrophysiology Office Follow up Visit Note:    Date:  12/02/2021   ID:  Jacqueline Deleon, DOB [birth year], MRN [id number]  PCP:  Derinda Late, MD  [facility] Cardiologist:  None  [facility] Electrophysiologist:  Vickie Epley, MD    Interval History:    Jacqueline Deleon is a 61 y.o. female who presents for a follow up visit. They were last seen in clinic Nov 26, 2020 for history of atrial flutter.  At the time of the last appointment, anticoagulation was being held given history of thrombocytopenia secondary to chemotherapy for her breast cancer.  She tells me she has been doing okay.  She has noticed that she has gained some weight which she thinks is contributing to exertional dyspnea.  No shortness of breath at rest.       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   Chicken pox    Depression    Fatty liver    Fatty liver    Gastric polyps    GERD (gastroesophageal reflux disease)    Gestational diabetes    prediabetes out side of having baby   Headache    migraines   Heart murmur    History of abnormal mammogram 08/2006, 01/2008, 03/2009   Hypertension    IBS (irritable bowel syndrome)    Joint disorder    hx of left ankle tendonitis, bursitis, heel spur   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 Smith/ laprascopic surgery   COLONOSCOPY  04/04/2000   COLONOSCOPY WITH PROPOFOL N/A 02/12/2019   Procedure: COLONOSCOPY WITH PROPOFOL;  Surgeon: Lollie Sails, MD;  Location: [facility] ENDOSCOPY;  Service: Endoscopy;  Laterality: N/A;   ESOPHAGOGASTRODUODENOSCOPY (EGD) WITH PROPOFOL N/A 05/01/2015   Procedure: ESOPHAGOGASTRODUODENOSCOPY (EGD) WITH PROPOFOL;  Surgeon: Hulen Luster, MD;  Location: [facility] ENDOSCOPY;  Service: Gastroenterology;  Laterality: N/A;   ESOPHAGOGASTRODUODENOSCOPY (EGD) WITH PROPOFOL N/A 02/12/2019   Procedure: ESOPHAGOGASTRODUODENOSCOPY (EGD) WITH PROPOFOL;  Surgeon: Lollie Sails, MD;  Location: [facility] ENDOSCOPY;  Service: Endoscopy;  Laterality: N/A;   EYE SURGERY  08/2012   laser surgery on retina/ DR Appenzeller   FINGER SURGERY     repair of tendon in right index, Dr. Francesco Sor in [facility] SUR[facility]ERY     Dr. Milinda Pointer  IRRIGATION AND DEBRIDEMENT SEBACEOUS CYST     right upper back   LAPAROSCOPIC SUPRACERVICAL HYSTERECTOMY  06/2007   Dr Kincius/ adhesions/CPP/adenomyosis   MASTECTOMY PARTIAL / LUMPECTOMY Right 01/2008   with sentinal lymph node biopsy/ infiltrating ductal cancer   REPAIR PERONEAL TENDONS ANKLE  10/2019   with tarsal exostectomy and sural neuroma excision on right    SKIN CANCER EXCISION     back and calves   WISDOM TOOTH EXTRACTION  1991    Current Medications: Current Meds  Medication Sig   acetaminophen (TYLENOL) 325 MG tablet Take 650 mg by mouth every 6 (six) hours as needed for mild pain or headache.   b complex vitamins capsule Take 1 capsule by mouth daily.   buPROPion (WELLBUTRIN XL) 300 MG  24 hr tablet Take 1 tablet (300 mg total) by mouth once daily   calcium carbonate (OSCAL) 1500 (600 Ca) MG TABS tablet Take by mouth 2 (two) times daily with a meal.   Cholecalciferol (VITAMIN D3) 10 MCG (400 UNIT) CAPS Take by mouth.   citalopram (CELEXA) 40 MG tablet Take 1 tablet (40 mg total) by mouth daily.   clotrimazole-betamethasone (LOTRISONE) cream APPLY EXTERNALLY TWICE A DAY FOR 2 WEEKS   diltiazem (CARDIZEM CD) 120 MG 24 hr capsule Take 1 capsule (120 mg total) by mouth daily.   diphenhydrAMINE (BENADRYL) 25 MG tablet Take 25 mg by mouth at bedtime as needed for sleep. Pt states daily now for sleep and allergies.   gabapentin (NEURONTIN) 100 MG capsule Take 2 capsules (200 mg total) by mouth 3 (three) times daily.   ketotifen (ZADITOR) 0.025 % ophthalmic solution Place 1 drop into both eyes daily.   letrozole (FEMARA) 2.5 MG tablet Take 1 tablet (2.5 mg total) by mouth daily.   lisinopril (ZESTRIL) 10 MG tablet Take 1 tablet (10 mg total) by mouth once daily   metFORMIN (GLUCOPHAGE-XR) 500 MG 24 hr tablet TAKE 1 TABLET BY MOUTH TWICE DAILY WITH A MEAL   Multiple Vitamin (MULTIVITAMIN) tablet Take 1 tablet by mouth daily.   mupirocin ointment (BACTROBAN) 2 % Apply 1 application topically 2 (two) times daily.   omeprazole (PRILOSEC) 40 MG capsule Take 1 capsule by mouth 2 times daily before a meal.   ondansetron (ZOFRAN) 4 MG tablet Take 1 tablet (4 mg total) by mouth every 8 (eight) hours as needed for nausea or vomiting.   palbociclib (IBRANCE) 75 MG tablet Take 1 tablet (75 mg total) by mouth daily. Take for 21 days on, 7 days off, repeat every 28 days.     Allergies:   Kiwi extract, Clorox nasal antiseptic [povidone-iodine], Codeine, Grapeseed extract [nutritional supplements], Amoxicillin, Erythromycin, Sulfa antibiotics, and Tape   Social History   Socioeconomic History   Marital status: Married    Spouse name: Richardson Landry   Number of children: 1   Years of education: 14    Highest education level: Not on file  Occupational History   Occupation: CMA  Tobacco Use   Smoking status: Never   Smokeless tobacco: Never  Vaping Use   Vaping Use: Never used  Substance and Sexual Activity   Alcohol use: Yes    Alcohol/week: 0.0 standard drinks    Comment: rare, 1-2 drinks per year   Drug use: No   Sexual activity: Yes    Partners: Male    Birth control/protection: Surgical    Comment: Hysterectomy   Other Topics Concern   Not on file  Social History Narrative  Lives in [place] with husband, no pets.  Works at [employer].      Diet - regular   Exercise - occasional   Social Determinants of Health   Financial Resource Strain: Not on file  Food Insecurity: Not on file  Transportation Needs: Not on file  Physical Activity: Not on file  Stress: Not on file  Social Connections: Not on file     Family History: The patient's family history includes Atrial fibrillation in her mother; Breast cancer (age of onset: 72) in her cousin; Breast cancer (age of onset: 37) in her cousin; Breast cancer (age of onset: 55) in her maternal aunt; Breast cancer (age of onset: 51) in her mother; Cancer in her maternal aunt and sister; Cancer (age of onset: 1) in her father; Colon cancer in her cousin; Colon cancer (age of onset: 64) in her mother; Diabetes in her maternal aunt and maternal grandmother; Heart disease in her maternal uncle and paternal grandfather; Hip fracture in her mother; Hypertension in her father, mother, and sister; Lung cancer (age of onset: 7) in her paternal aunt; Lymphoma in her maternal uncle; Parkinson's disease in her father; Skin cancer in her father and mother; Stroke in her father and maternal grandfather; Thyroid cancer in her cousin; Thyroid disease in her mother and sister.  ROS:   Please see the history of present illness.    All other systems reviewed and are negative.  EKGs/Labs/Other Studies Reviewed:    The following studies were  reviewed today:   EKG:  The ekg ordered today demonstrates sinus rhythm.  Ventricular rate 100 bpm.  Recent Labs: 11/17/2021: ALT 20; BUN 20; Creatinine, Ser 0.94; Hemoglobin 12.2; Platelets 238; Potassium 4.0; Sodium 135  Recent Lipid Panel    Component Value Date/Time   CHOL 154 03/06/2020 0811   TRIG 103 03/06/2020 0811   HDL 59 03/06/2020 0811   CHOLHDL 2.8 08/24/2019 0815   LDLCALC 76 03/06/2020 0811    Physical Exam:    VS:  BP 106/64   Pulse 100   Ht _0  (1.6 m)   Wt 272 lb (123.4 kg)   SpO2 98%   BMI 48.18 kg/m     Wt Readings from Last 3 Encounters:  12/02/21 272 lb (123.4 kg)  11/24/21 272 lb 2 oz (123.4 kg)  11/17/21 276 lb 14.4 oz (125.6 kg)     GEN:  Well nourished, well developed in no acute distress.  Obese HEENT: Normal NECK: No JVD; No carotid bruits LYMPHATICS: No lymphadenopathy CARDIAC: RRR, no murmurs, rubs, gallops RESPIRATORY:  Clear to auscultation without rales, wheezing or rhonchi  ABDOMEN: Soft, non-tender, non-distended MUSCULOSKELETAL:  No edema; No deformity  SKIN: Warm and dry NEUROLOGIC:  Alert and oriented x 3 PSYCHIATRIC:  Normal affect        ASSESSMENT:    1. Atrial flutter with rapid ventricular response ([place])   2. Cirrhosis of liver not due to alcohol ([facility])    PLAN:    In order of problems listed above:  #Atrial flutter No recurrence after the single episode in the past.  Have previously discussed anticoagulation but given the single episode of atrial flutter, cancer and previous thrombocytopenia, we elected to avoid anticoagulation.  I recommended ongoing follow-up with her primary care physician.  If she were to have recurrence of an arrhythmia, would revisit the question of anticoagulation and stroke prevention.  #Metastatic breast cancer, treatment ongoing.  #NASH cirrhosis  Follow-up as needed.    Medication Adjustments/Labs  and Tests Ordered: Current medicines are reviewed at length with the patient  today.  Concerns regarding medicines are outlined above.  No orders of the defined types were placed in this encounter.  No orders of the defined types were placed in this encounter.    Signed, Lars Mage, MD, [facility], [facility] 12/02/2021 8:33 AM    Electrophysiology [facility]

## 2021-12-01 NOTE — Telephone Encounter (Signed)
Patient verbalized understanding of results  

## 2021-12-02 ENCOUNTER — Encounter: Payer: Self-pay | Admitting: Cardiology

## 2021-12-02 ENCOUNTER — Ambulatory Visit (INDEPENDENT_AMBULATORY_CARE_PROVIDER_SITE_OTHER): Payer: 59 | Admitting: Cardiology

## 2021-12-02 VITALS — BP 106/64 | HR 100 | Ht 63.0 in | Wt 272.0 lb

## 2021-12-02 DIAGNOSIS — I4892 Unspecified atrial flutter: Secondary | ICD-10-CM

## 2021-12-02 DIAGNOSIS — K7460 Unspecified cirrhosis of liver: Secondary | ICD-10-CM | POA: Diagnosis not present

## 2021-12-02 NOTE — Patient Instructions (Signed)
Medication Instructions:  Your physician recommends that you continue on your current medications as directed. Please refer to the Current Medication list given to you today. *If you need a refill on your cardiac medications before your next appointment, please call your pharmacy*  Lab Work: None. If you have labs (blood work) drawn today and your tests are completely normal, you will receive your results only by: [place] (if you have MyChart) OR A paper copy in the mail If you have any lab test that is abnormal or we need to change your treatment, we will call you to review the results.  Testing/Procedures: None.  Follow-Up: At [facility], you and your health needs are our priority.  As part of our continuing mission to provide you with exceptional heart care, we have created designated Provider Care Teams.  These Care Teams include your primary Cardiologist (physician) and Advanced Practice Providers (APPs -  Physician Assistants and Nurse Practitioners) who all work together to provide you with the care you need, when you need it.  Your physician wants you to follow-up in: As needed with Lars Mage, MD   We recommend signing up for the patient portal called "MyChart".  Sign up information is provided on this After Visit Summary.  MyChart is used to connect with patients for Virtual Visits (Telemedicine).  Patients are able to view lab/test results, encounter notes, upcoming appointments, etc.  Non-urgent messages can be sent to your provider as well.   To learn more about what you can do with MyChart, go to [email or web address].    Any Other Special Instructions Will Be Listed Below (If Applicable).  How Your Oral Health Affects Your Overall Health Your teeth and gums have a huge role to play in other, more systemic health conditions FACEBOOKTWITTERLINKEDINPINTERESTEmailPerson brushing teeth while looking in mirror in bathroom. You know you're supposed to brush and  floss every day, but you might be surprised to learn that it's not just about preventing cavities and keeping your smile pearly white. It's also because keeping your mouth healthy is an important part of your overall health and well-being.  Oral health is linked to whole-body health, which means that problems with your teeth and gums can lead to other health concerns like heart disease, stroke and more. Periodontist Marcy Siren, DMD, MS, explains the connection between oral health and the rest of your body, including how you can best practice good dental hygiene.  Why oral health matters You might think of your mouth as separate from the rest of your body, whether because your dentist is different from your doctor, or because your dental insurance isn't bundled with the rest of your health insurance.  "In reality, though, you should think of your mouth as an extension of the rest of your body," Dr. Harrington Challenger says. "By looking in a person's mouth, I often get a sense of what their overall health is."  Having poor oral health can include conditions like:  Gingivitis, when bacteria infect your gums. It's a mild, early form of gum disease. Periodontal disease is a gum infection that leads to inflamed gums and bone loss around teeth. Tooth decay, like from untreated cavities. What can happen if you have poor oral health? Beyond yellowing smiles and bad breath, poor oral health can also contribute to a number of health issues that affect your whole body. Dr. Harrington Challenger explains some of the most critical among them.  Cardiovascular disease The umbrella term "cardiovascular disease" refers to a group of disorders  related to your heart and your blood vessels. Having poor oral health is associated with forms of cardiovascular disease like:  Coronary artery disease: As the most common type of heart disease, coronary artery disease can lead to heart attack, heart failure and more. It's the leading cause of death in the  [place]. Clogged arteries: Studies show that people with periodontal disease have significantly higher rates of atherosclerosis, when plaque builds up inside the blood vessels that deliver blood and oxygen from your heart to your body. Stroke: Studies show a strong association between periodontal disease and strokes, specifically strokes related to atherosclerosis. A caveat, though: "Keep in mind that even though cardiovascular disease and periodontal disease are associated with each other, there's so far no evidence that one causes the other," Dr. Harrington Challenger says.  Endocarditis If you have heart disease or other heart-related health issues, you're at a higher risk of developing endocarditis, an inflammation of the lining of your heart valves (and sometimes the lining of your heart chambers).  "Endocarditis is caused by a bacterial infection that you can contract during procedures like tooth extractions," Dr. Harrington Challenger explains. "It doesn't typically affect healthy hearts, but if you have existing heart issues, it can be fatal."  Pregnancy and birth complications When you're pregnant, there's extra reason to take care of your body -- including your mouth. In people who are pregnant, poor oral health is associated with:  Fetal growth restriction. Gestational diabetes. Low birth weight. Miscarriage. Stillbirth. Preeclampsia. "Again, the thought is that oral bacteria can travel into the bloodstream and cause harm to the fetus," Dr. Harrington Challenger warns.   Pneumonia Having cavities has been linked to developing pneumonia, a lung infection caused by bacteria, viruses or fungi.  "The thinking is that bacteria from the mouth can aspirate into the upper airway and into the lungs, which may be related to causing pneumonia," Dr. Harrington Challenger explains. "It also makes it easier for the bacteria that cause respiratory infections to stick in the lungs."  Other issues Of course, having a healthy mouth is key to your ability to  consume healthy meals. "The act of eating, which is essential for our survival, really depends on having teeth in your mouth and healthy teeth and gums," Dr. Harrington Challenger says.  Untreated cavities can lead to poor nutrition and stunted growth and development in children. They can also cause issues like:  Cellulitis (a bacterial infection) Facial swelling. Gum disease. What affects your oral health? There are a few factors that contribute to the relationship between oral health and systemic health. Dr. Harrington Challenger explains some of the links.  Common risk factors Periodontal disease and systemic disorders share a number of common risk factors, including:  A poor diet, especially one high in sugar. Tobacco use. Excessive alcohol use. High stress. All of these things can cause periodontal disease or cavities, and they can also cause systemic health disorders -- so it makes sense that if you have one or more of these risk factors, you might have other related health concerns.  Genetics Blame it on Mom and Dad: "Certain people are just more predisposed to developing periodontal disease and systemic diseases," Dr. Harrington Challenger explains.  Your body's response to bacteria This one isn't genetic, per se, but it is related to your unique and inherent bodily responses.  "Everyone's body responds to bacteria differently," Dr. Harrington Challenger says. "For instance, our bodies mount a huge response to bacteria that can, in some people, cause inflammation and other damage."  Levels of inflammatory  molecules like C-reactive protein are often elevated in people who have both periodontal disease and systemic disease.  Certain medical conditions Just like poor oral health can contribute to other medical conditions, the reverse is true, too: There are some diseases and disorders that can cause oral health problems.  One of the big ones is diabetes. "People with poorly controlled diabetes have a much greater risk of developing periodontal  disease and of having that periodontal disease progress and be more severe in nature," Dr. Harrington Challenger says.  Osteoporosis is also associated with periodontal disease, as studies suggest that the low bone mineral density associated with the condition can affect your jaw. The type of bone loss associated with periodontal disease is called alveolar bone loss, which refers to the part of your jawbone that has tooth sockets.  Other conditions that can affect your oral health include:  Alzheimer's disease. Fibromyalgia. HIV/AIDS. Prostate cancer. Rheumatoid arthritis. "There are many studies coming out right now that show connections between these conditions and periodontal disease," Dr. Harrington Challenger says. "We expect more data soon that continues to show a linkage between them."  How to practice good oral hygiene If you're starting to feel a little panicky that you haven't flossed your teeth yet today (or this week), take a deep breath. There's plenty you can start doing right now to improve your oral hygiene habits and keep your mouth both happy and healthy.  Here's what Dr. Harrington Challenger recommends.  Brush your teeth twice a day. Use a fluoridated toothpaste and make sure you're brushing for two whole minutes. Dr. Harrington Challenger recommends using an electric toothbrush. Floss once a day. To hit those tough-to-clean crevices, use actual dental floss rather than those little floss picks. And if you're not sure you're flossing right, ask your dentist or dental hygienist to walk you through it. Try other home tools for oral hygiene. Options like mouthwash and Waterpik can help you keep your teeth and gums in tip-top shape. See your dentist twice a year. Regular exams, X-rays and cleanings will keep your smile looking great and keep you healthy. "Seeing your dentist more regularly has been shown to decrease your risk for developing a stroke and other conditions," Dr. Harrington Challenger says. Make an appointment with a periodontist. If you've never  visited one, now's the time to start! Dr. Harrington Challenger recommends making an annual appointment with a periodontist, who can make sure your gums and jaw are healthy (and help you keep them that way). Manage your other health concerns. Focusing on heart health and managing conditions like diabetes and osteoporosis are critical to keeping your mouth healthy (not to mention the rest of you). Maintain a healthy overall lifestyle. What's good for your body is good for your mouth, too. To keep yourself on a healthy path, try to exercise regularly, eat nutritious foods and avoid activities like smoking and drinking to excess. "Treating your oral health can impact your overall health, so it's really important to take care of your teeth and your mouth," Dr. Harrington Challenger reiterates.  What if you're afraid of going to the dentist? Look, the truth is that dentists, periodontists and orthodontists know that you may be scared of visiting them. And they're skilled in working with nervous patients to try to make the whole experience much less anxiety-inducing.

## 2021-12-03 ENCOUNTER — Other Ambulatory Visit (HOSPITAL_COMMUNITY): Payer: Self-pay

## 2021-12-03 ENCOUNTER — Other Ambulatory Visit: Payer: Self-pay | Admitting: Cardiology

## 2021-12-03 ENCOUNTER — Other Ambulatory Visit: Payer: Self-pay

## 2021-12-04 ENCOUNTER — Other Ambulatory Visit: Payer: Self-pay

## 2021-12-04 ENCOUNTER — Other Ambulatory Visit (HOSPITAL_COMMUNITY): Payer: Self-pay

## 2021-12-04 MED ORDER — DILTIAZEM HCL ER COATED BEADS 120 MG PO CP24
120.0000 mg | ORAL_CAPSULE | Freq: Every day | ORAL | 2 refills | Status: DC
  Filled 2021-12-09 – 2021-12-29 (×2): qty 90, 90d supply, fill #0
  Filled 2022-03-29: qty 90, 90d supply, fill #1
  Filled 2022-06-24: qty 90, 90d supply, fill #2

## 2021-12-09 ENCOUNTER — Encounter: Payer: Self-pay | Admitting: Obstetrics and Gynecology

## 2021-12-09 ENCOUNTER — Encounter: Payer: Self-pay | Admitting: Oncology

## 2021-12-09 ENCOUNTER — Other Ambulatory Visit (HOSPITAL_COMMUNITY): Payer: Self-pay

## 2021-12-09 ENCOUNTER — Other Ambulatory Visit: Payer: Self-pay

## 2021-12-09 ENCOUNTER — Ambulatory Visit (INDEPENDENT_AMBULATORY_CARE_PROVIDER_SITE_OTHER): Payer: 59

## 2021-12-09 ENCOUNTER — Telehealth: Payer: Self-pay

## 2021-12-09 DIAGNOSIS — R350 Frequency of micturition: Secondary | ICD-10-CM

## 2021-12-09 LAB — POCT URINALYSIS DIPSTICK
Bilirubin, UA: NEGATIVE
Blood, UA: 1
Glucose, UA: NEGATIVE
Ketones, UA: NEGATIVE
Nitrite, UA: POSITIVE
Protein, UA: POSITIVE — AB
Spec Grav, UA: 1.01 (ref 1.010–1.025)
Urobilinogen, UA: NEGATIVE E.U./dL — AB
pH, UA: 6 (ref 5.0–8.0)

## 2021-12-09 MED ORDER — SULFAMETHOXAZOLE-TRIMETHOPRIM 800-160 MG PO TABS
1.0000 | ORAL_TABLET | Freq: Two times a day (BID) | ORAL | 1 refills | Status: DC
  Filled 2021-12-09: qty 14, 7d supply, fill #0
  Filled 2021-12-10 – 2022-01-04 (×2): qty 14, 7d supply, fill #1

## 2021-12-09 NOTE — Telephone Encounter (Signed)
Sorry per the new protocol I can send in this antibiotic. SO I took care of it, Thanks Opal Sidles

## 2021-12-09 NOTE — Telephone Encounter (Signed)
Pt has a urine frequency and urine dip in office positive for blood,nitrites and leukocytes, sent culture to lab sent in Bactrim as pt has tolerated well in the past.

## 2021-12-10 ENCOUNTER — Other Ambulatory Visit: Payer: Self-pay

## 2021-12-11 ENCOUNTER — Other Ambulatory Visit: Payer: Self-pay

## 2021-12-11 LAB — URINE CULTURE

## 2021-12-13 ENCOUNTER — Other Ambulatory Visit: Payer: Self-pay | Admitting: Obstetrics

## 2021-12-13 DIAGNOSIS — R399 Unspecified symptoms and signs involving the genitourinary system: Secondary | ICD-10-CM

## 2021-12-13 MED ORDER — PHENAZOPYRIDINE HCL 200 MG PO TABS: 200.0000 mg | ORAL_TABLET | Freq: Three times a day (TID) | ORAL | 1 refills | Status: DC | PRN

## 2021-12-14 ENCOUNTER — Other Ambulatory Visit (HOSPITAL_COMMUNITY): Payer: Self-pay

## 2021-12-14 ENCOUNTER — Other Ambulatory Visit: Payer: Self-pay | Admitting: *Deleted

## 2021-12-14 ENCOUNTER — Other Ambulatory Visit: Payer: Self-pay

## 2021-12-14 MED ORDER — FLUCONAZOLE 50 MG PO TABS: 150.0000 mg | ORAL_TABLET | Freq: Once | ORAL | 0 refills | Status: AC

## 2021-12-17 ENCOUNTER — Other Ambulatory Visit (HOSPITAL_COMMUNITY): Payer: Self-pay

## 2021-12-17 MED ORDER — LISINOPRIL 10 MG PO TABS
10.0000 mg | ORAL_TABLET | Freq: Every day | ORAL | 1 refills | Status: DC
  Filled 2021-12-29: qty 90, 90d supply, fill #0
  Filled 2022-03-29: qty 90, 90d supply, fill #1

## 2021-12-18 ENCOUNTER — Other Ambulatory Visit (HOSPITAL_COMMUNITY): Payer: Self-pay

## 2021-12-18 DIAGNOSIS — Z760 Encounter for issue of repeat prescription: Secondary | ICD-10-CM | POA: Diagnosis not present

## 2021-12-29 ENCOUNTER — Other Ambulatory Visit (HOSPITAL_COMMUNITY): Payer: Self-pay

## 2021-12-29 ENCOUNTER — Other Ambulatory Visit: Payer: Self-pay | Admitting: Oncology

## 2021-12-29 DIAGNOSIS — C50919 Malignant neoplasm of unspecified site of unspecified female breast: Secondary | ICD-10-CM

## 2021-12-29 MED ORDER — PALBOCICLIB 75 MG PO TABS
75.0000 mg | ORAL_TABLET | Freq: Every day | ORAL | 1 refills | Status: DC
  Filled 2021-12-29 – 2021-12-31 (×4): qty 21, 21d supply, fill #0
  Filled 2022-01-14: qty 21, 28d supply, fill #1

## 2021-12-29 NOTE — Telephone Encounter (Signed)
CBC with Differential/Platelet Order: [id number] Status: Final result    Visible to patient: Yes (seen)    Next appt: 01/26/2022 at 12:45 PM in Oncology ([facility])    Dx: Malignant neoplasm of right female br...    0 Result Notes           Component Ref Range & Units 1 mo ago 2 mo ago 3 mo ago 4 mo ago 5 mo ago 6 mo ago 7 mo ago  WBC 4.0 - 10.5 K/uL 3.2 Low   4.5  4.2  3.3 Low   6.2  3.4 Low   3.1 Low    RBC 3.87 - 5.11 MIL/uL 3.86 Low   3.85 Low   3.82 Low   3.89  3.73 Low   3.80 Low   3.75 Low    Hemoglobin 12.0 - 15.0 g/dL 12.2  12.0  12.0  12.2  12.0  12.5  12.2   HCT 36.0 - 46.0 % 37.2  37.1  37.2  37.6  36.7  38.3  37.4   MCV 80.0 - 100.0 fL 96.4  96.4  97.4  96.7  98.4  100.8 High   99.7   MCH 26.0 - 34.0 pg 31.6  31.2  31.4  31.4  32.2  32.9  32.5   MCHC 30.0 - 36.0 g/dL 32.8  32.3  32.3  32.4  32.7  32.6  32.6   RDW 11.5 - 15.5 % 16.8 High   16.8 High   16.2 High   15.9 High   14.9  15.7 High   15.8 High    Platelets 150 - 400 K/uL 238  199  184  151  193  168  156   nRBC 0.0 - 0.2 % 0.0  0.4 High   0.0  0.0  0.0  0.0  0.0   Neutrophils Relative % % 62  53  59  55  61  58  54   Neutro Abs 1.7 - 7.7 K/uL 2.0  2.4  2.5  1.8  3.8  1.9  1.7   Lymphocytes Relative % '27  26  28  30  19  25  27   '$ Lymphs Abs 0.7 - 4.0 K/uL 0.9  1.2  1.2  1.0  1.1  0.9  0.8   Monocytes Relative % '8  17  9  11  15  14  15   '$ Monocytes Absolute 0.1 - 1.0 K/uL 0.2  0.8  0.4  0.4  0.9  0.5  0.5   Eosinophils Relative % '2  2  2  3  2  2  2   '$ Eosinophils Absolute 0.0 - 0.5 K/uL 0.1  0.1  0.1  0.1  0.1  0.1  0.1   Basophils Relative % '1  1  1  1  1  1  1   '$ Basophils Absolute 0.0 - 0.1 K/uL 0.0  0.1  0.0  0.0  0.1  0.0  0.0   Immature Granulocytes % 0  1  1  0  2  0  1   Abs Immature Granulocytes 0.00 - 0.07 K/uL 0.01  0.04 CM  0.02 CM  0.01 CM  0.09 High  CM  0.01 CM  0.02 CM   Comment: Performed at [facility], [address] [place], [place] [address]  Resulting Agency  [place] [facility] [place] [facility] [place]  [facility] [place] [facility] [place] [facility] [place] [facility] [place]  LAB         Specimen Collected: 11/17/21 12:35 Last Resulted: 11/17/21 12:50      Lab Flowsheet    Order Details    View Encounter    Lab and Collection Details    Routing    Result History    View All Conversations on this Encounter      CM=Additional comments      Result Care Coordination   Patient Communication   Add Comments   Seen Back to Top       Other Results from 11/17/2021   Contains abnormal data Comprehensive metabolic panel Order: [id number] Status: Final result    Visible to patient: Yes (seen)    Next appt: 01/26/2022 at 12:45 PM in Oncology ([facility])    Dx: Malignant neoplasm of right female br...    0 Result Notes           Component Ref Range & Units 1 mo ago 2 mo ago 3 mo ago 4 mo ago 5 mo ago 6 mo ago 7 mo ago  Sodium 135 - 145 mmol/L 135  135  135  135  135  139  136   Potassium 3.5 - 5.1 mmol/L 4.0  4.4  4.5  4.0  4.4  4.3  4.5   Chloride 98 - 111 mmol/L 106  106  103  102  102  105  102   CO2 22 - 32 mmol/L 21 Low   21 Low   '24  22  24  23  22   '$ Glucose, Bld 70 - 99 mg/dL 138 High   88 CM  96 CM  121 High  CM  96 CM  110 High  CM  106 High  CM   Comment: Glucose reference range applies only to samples taken after fasting for at least 8 hours.  BUN 6 - 20 mg/dL '20  18  23 '$ High   '17  20  22 '$ High   19   Creatinine, Ser 0.44 - 1.00 mg/dL 0.94  0.88  1.08 High   0.98  1.11 High   1.20 High   1.02 High    Calcium 8.9 - 10.3 mg/dL 8.4 Low   9.0  9.3  9.0  8.9  9.4  9.0   Total Protein 6.5 - 8.1 g/dL 7.3  7.5  7.9  7.2  7.4  7.3  7.3   Albumin 3.5 - 5.0 g/dL 3.9  4.2  4.3  4.0  3.9  4.1  4.1   AST 15 - 41 U/L '22  23  20  24  23  20  22   '$ ALT 0 - 44 U/L '20  21  19  18  27  20  23   '$ Alkaline Phosphatase 38 - 126 U/L 90  88  104  84  103  91  91   Total Bilirubin 0.3 - 1.2 mg/dL 0.7  0.4  0.3  0.2 Low   0.4  <0.1 Low   0.4   GFR, Estimated >60 mL/min >60  >60 CM  60 Low  CM  >60 CM  58 Low  CM  52  Low  CM  >60 CM   Comment: (NOTE)  Calculated using the CKD-EPI Creatinine Equation (2021)   Anion gap 5 - '15 8  8 '$ CM  8 CM  11 CM  9 CM  11 CM  12 CM   Comment: Performed at [facility], [address] [place], [place] [address]  Resulting Agency  [facility] [place] [facility] [place] [facility] [place] [facility] [place] [facility] [place] [facility] [place] [facility]         Specimen Collected: 11/17/21 12:35 Last Resulted: 11/17/21 13:00

## 2021-12-30 ENCOUNTER — Other Ambulatory Visit (HOSPITAL_COMMUNITY): Payer: Self-pay

## 2021-12-31 ENCOUNTER — Other Ambulatory Visit (HOSPITAL_COMMUNITY): Payer: Self-pay

## 2022-01-01 ENCOUNTER — Other Ambulatory Visit: Payer: Self-pay

## 2022-01-03 ENCOUNTER — Other Ambulatory Visit: Payer: Self-pay

## 2022-01-03 ENCOUNTER — Other Ambulatory Visit (HOSPITAL_COMMUNITY): Payer: Self-pay

## 2022-01-04 ENCOUNTER — Other Ambulatory Visit (HOSPITAL_COMMUNITY): Payer: Self-pay

## 2022-01-04 ENCOUNTER — Other Ambulatory Visit: Payer: Self-pay

## 2022-01-14 ENCOUNTER — Other Ambulatory Visit (HOSPITAL_COMMUNITY): Payer: Self-pay

## 2022-01-16 ENCOUNTER — Other Ambulatory Visit (HOSPITAL_COMMUNITY): Payer: Self-pay

## 2022-01-21 ENCOUNTER — Other Ambulatory Visit: Payer: Self-pay

## 2022-01-22 ENCOUNTER — Other Ambulatory Visit: Payer: Self-pay

## 2022-01-25 ENCOUNTER — Other Ambulatory Visit (HOSPITAL_COMMUNITY): Payer: Self-pay

## 2022-01-26 ENCOUNTER — Other Ambulatory Visit: Payer: Self-pay

## 2022-01-26 ENCOUNTER — Inpatient Hospital Stay: Payer: 59 | Attending: Oncology

## 2022-01-26 ENCOUNTER — Other Ambulatory Visit: Payer: Self-pay | Admitting: *Deleted

## 2022-01-26 ENCOUNTER — Inpatient Hospital Stay: Payer: 59

## 2022-01-26 ENCOUNTER — Encounter: Payer: Self-pay | Admitting: Oncology

## 2022-01-26 ENCOUNTER — Inpatient Hospital Stay (HOSPITAL_BASED_OUTPATIENT_CLINIC_OR_DEPARTMENT_OTHER): Payer: 59 | Admitting: Oncology

## 2022-01-26 VITALS — BP 108/59 | HR 81 | Temp 98.6°F | Resp 18 | Wt 273.5 lb

## 2022-01-26 DIAGNOSIS — Z5181 Encounter for therapeutic drug level monitoring: Secondary | ICD-10-CM | POA: Diagnosis not present

## 2022-01-26 DIAGNOSIS — C7951 Secondary malignant neoplasm of bone: Secondary | ICD-10-CM | POA: Insufficient documentation

## 2022-01-26 DIAGNOSIS — Z170 Estrogen receptor positive status [ER+]: Secondary | ICD-10-CM | POA: Diagnosis not present

## 2022-01-26 DIAGNOSIS — Z79899 Other long term (current) drug therapy: Secondary | ICD-10-CM | POA: Diagnosis not present

## 2022-01-26 DIAGNOSIS — Z7983 Long term (current) use of bisphosphonates: Secondary | ICD-10-CM

## 2022-01-26 DIAGNOSIS — Z79811 Long term (current) use of aromatase inhibitors: Secondary | ICD-10-CM | POA: Diagnosis not present

## 2022-01-26 DIAGNOSIS — C50919 Malignant neoplasm of unspecified site of unspecified female breast: Secondary | ICD-10-CM

## 2022-01-26 DIAGNOSIS — C50911 Malignant neoplasm of unspecified site of right female breast: Secondary | ICD-10-CM | POA: Diagnosis not present

## 2022-01-26 LAB — COMPREHENSIVE METABOLIC PANEL
ALT: 24 U/L (ref 0–44)
AST: 26 U/L (ref 15–41)
Albumin: 3.7 g/dL (ref 3.5–5.0)
Alkaline Phosphatase: 83 U/L (ref 38–126)
Anion gap: 7 (ref 5–15)
BUN: 15 mg/dL (ref 6–20)
CO2: 22 mmol/L (ref 22–32)
Calcium: 7.9 mg/dL — ABNORMAL LOW (ref 8.9–10.3)
Chloride: 107 mmol/L (ref 98–111)
Creatinine, Ser: 1.06 mg/dL — ABNORMAL HIGH (ref 0.44–1.00)
GFR, Estimated: >60 mL/min (ref >60)
Glucose, Bld: 144 mg/dL — ABNORMAL HIGH (ref 70–99)
Potassium: 3.9 mmol/L (ref 3.5–5.1)
Sodium: 136 mmol/L (ref 135–145)
Total Bilirubin: 0.5 mg/dL (ref 0.3–1.2)
Total Protein: 6.9 g/dL (ref 6.5–8.1)

## 2022-01-26 LAB — CBC WITH DIFFERENTIAL/PLATELET
Abs Immature Granulocytes: 0.01 10*3/uL (ref 0.00–0.07)
Basophils Absolute: 0 10*3/uL (ref 0.0–0.1)
Basophils Relative: 1 %
Eosinophils Absolute: 0.1 10*3/uL (ref 0.0–0.5)
Eosinophils Relative: 3 %
HCT: 35.3 % — ABNORMAL LOW (ref 36.0–46.0)
Hemoglobin: 11.7 g/dL — ABNORMAL LOW (ref 12.0–15.0)
Immature Granulocytes: 0 %
Lymphocytes Relative: 32 %
Lymphs Abs: 1.1 10*3/uL (ref 0.7–4.0)
MCH: 32 pg (ref 26.0–34.0)
MCHC: 33.1 g/dL (ref 30.0–36.0)
MCV: 96.4 fL (ref 80.0–100.0)
Monocytes Absolute: 0.3 10*3/uL (ref 0.1–1.0)
Monocytes Relative: 9 %
Neutro Abs: 1.8 10*3/uL (ref 1.7–7.7)
Neutrophils Relative %: 55 %
Platelets: 171 10*3/uL (ref 150–400)
RBC: 3.66 MIL/uL — ABNORMAL LOW (ref 3.87–5.11)
RDW: 15.7 % — ABNORMAL HIGH (ref 11.5–15.5)
Smear Review: NORMAL
WBC: 3.4 10*3/uL — ABNORMAL LOW (ref 4.0–10.5)
nRBC: 0 % (ref 0.0–0.2)

## 2022-01-26 NOTE — Progress Notes (Signed)
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  It was a 1.4 cm tumor with negative lymph nodes grade 1 and patient went on to get lumpectomy followed by adjuvant radiation therapy.  Oncotype DX score was 6 and she did not require adjuvant chemotherapy.  She was on tamoxifen for 5 years.  She had BRCA testing done at that time which was negative.   Patient felt a lump in her right axilla in September 2021.  She underwent ultrasound of bilateral breasts which did not pick up any axillary mass.  A prior screening mammogram in September 2021 was unremarkable.  She was then admitted for Covid pneumonia and underwent CT angio chest on 07/22/2020 which incidentally picked up an axillary mass measuring 2.5 cm.  No obvious breast mass.  Axillary mass was biopsied and was consistent with high-grade invasive mammary carcinoma.  IHC was positive for GATA3 with diffuse strong nuclear staining.  There was no lymph node architecture identified in the sample and it could not be determined if it is a primary tumor within the axillary breast tissue or metastases.  ER strongly positive greater than 90%, PR 1 to 10% positive and HER-2 negative   PET CT scan showed hypermetabolic poorly marginated solid right axillary mass 2.5 x 2 cm with an SUV of 5.6.  No enlarged hypermetabolic mediastinal or hilar lymph nodes no  enlarged left axillary lymph nodes.  Subcentimeter lung nodules below PET resolution.  Small hypermetabolism in the right liver with an SUV of 6 without CT correlate equivocal for liver metastases.  Multiple hypermetabolic faintly lytic osseous lesions throughout the lumbar spine and bilateral pelvic girdle with representative supra-acetabular right iliac bone 1.9 cm lesion, anterior superior left acetabular 1.7 cm lesion and L1 1.2 cm lesion.   NGS testing showed no actionable mutations.  PD-L1 less than 1%.  CHEK2 S422 FS, K3 CA and 1068 FS, PICC 3 CA and 345H, CCN E1 gain, ERB B2 1 P-CSF 1 hour fusion, FGF 19 gain, FGF 3 gain, FGF 4gain.   Ibrance plus letrozole started in February 2022.  Baseline tumor markers not elevated.      Interval history-she has baseline fatigue.  Denies any new complaints at this time.  Hip pain is well controlled.  She is compliant with letrozole.  ECOG PS- 1 Pain scale- 0   Review of systems- Review of Systems  Constitutional:  Positive for malaise/fatigue. Negative for chills, fever and weight loss.  HENT:  Negative for congestion, ear discharge and nosebleeds.   Eyes:  Negative for blurred vision.  Respiratory:  Negative for cough, hemoptysis, sputum production, shortness of breath and wheezing.   Cardiovascular:  Negative for chest pain, palpitations, orthopnea and claudication.  Gastrointestinal:  Negative for abdominal pain, blood in stool, constipation, diarrhea, heartburn, melena, nausea and vomiting.  Genitourinary:  Negative for dysuria, flank pain, frequency, hematuria and urgency.  Musculoskeletal:  Negative for back pain, joint pain  and myalgias.  Skin:  Negative for rash.  Neurological:  Negative for dizziness, tingling, focal weakness, seizures, weakness and headaches.  Endo/Heme/Allergies:  Does not bruise/bleed easily.  Psychiatric/Behavioral:  Negative for depression and suicidal ideas. The patient does not have insomnia.       Allergies   Allergen Reactions   Kiwi Extract Itching and Swelling    The real kiwi fruit   Clorox Nasal Antiseptic [Povidone-Iodine] Other (See Comments)    Throat stricture when using it    Codeine Other (See Comments)    Felt funny.   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   Chicken pox    Depression    Fatty liver    Fatty liver    Gastric polyps    GERD (gastroesophageal reflux disease)    Gestational diabetes    prediabetes out side of having baby   Headache    migraines   Heart murmur    History of abnormal mammogram 08/2006, 01/2008, 03/2009   Hypertension    IBS (irritable bowel syndrome)    Joint disorder    hx of left ankle tendonitis, bursitis, heel spur   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 Smith/ laprascopic surgery   COLONOSCOPY  04/04/2000   COLONOSCOPY WITH PROPOFOL N/A 02/12/2019   Procedure: COLONOSCOPY WITH PROPOFOL;  Surgeon: Lollie Sails, MD;  Location: [facility] ENDOSCOPY;  Service: Endoscopy;  Laterality: N/A;   ESOPHAGOGASTRODUODENOSCOPY (EGD) WITH PROPOFOL N/A 05/01/2015   Procedure: ESOPHAGOGASTRODUODENOSCOPY (EGD) WITH PROPOFOL;  Surgeon: Hulen Luster, MD;  Location: [facility] ENDOSCOPY;  Service: Gastroenterology;  Laterality: N/A;   ESOPHAGOGASTRODUODENOSCOPY (EGD) WITH PROPOFOL N/A 02/12/2019   Procedure: ESOPHAGOGASTRODUODENOSCOPY (EGD) WITH PROPOFOL;  Surgeon: Lollie Sails, MD;  Location: [facility] ENDOSCOPY;  Service: Endoscopy;  Laterality: N/A;   EYE SURGERY  08/2012   laser surgery on retina/ DR Appenzeller   FINGER SURGERY     repair of tendon in right index, Dr. Francesco Sor in [place]  Dr. Al Corpus   IRRIGATION AND DEBRIDEMENT SEBACEOUS CYST     right upper back   LAPAROSCOPIC SUPRACERVICAL HYSTERECTOMY  06/2007   Dr Kincius/ adhesions/CPP/adenomyosis   MASTECTOMY PARTIAL / LUMPECTOMY Right 01/2008   with sentinal lymph node biopsy/ infiltrating ductal cancer   REPAIR PERONEAL TENDONS ANKLE  10/2019   with tarsal exostectomy and sural neuroma excision on right    SKIN CANCER EXCISION     back and calves   WISDOM TOOTH EXTRACTION  1991    Social History   Socioeconomic History   Marital status: Married    Spouse name: Brett Canales   Number of children: 1   Years of education: 14   Highest education level: Not on file  Occupational History   Occupation: CMA  Tobacco Use   Smoking status: Never   Smokeless tobacco: Never  Vaping Use   Vaping Use: Never used  Substance and Sexual Activity   Alcohol use: Yes    Alcohol/week: 0.0 standard drinks of alcohol    Comment: rare, 1-2 drinks per year   Drug use: No   Sexual activity: Yes    Partners: Male    Birth control/protection: Surgical     Comment: Hysterectomy   Other Topics Concern   Not on file  Social History Narrative   Lives in [place] with husband, no pets.  Works at [employer].      Diet - regular   Exercise - occasional   Social Determinants of Health   Financial Resource Strain: Not on file  Food Insecurity: Not on file  Transportation Needs: Not on file  Physical Activity: Insufficiently Active (07/27/2017)   Exercise Vital Sign    Days of Exercise per Week: 4 days    Minutes of Exercise per Session: 30 min  Stress: No Stress Concern Present (07/27/2017)   [employer] of Occupational Health - Occupational Stress Questionnaire    Feeling of Stress : Only a little  Social Connections: Socially Integrated (07/27/2017)   Social Connection and Isolation Panel [NHANES]    Frequency of Communication with Friends and Family: More than three times a week    Frequency of Social Gatherings with Friends and Family: Once a week    Attends Religious Services: More than 4 times per year    Active Member of [employer] or Organizations: Yes    Attends Banker Meetings: More than 4 times per year    Marital Status: Married  Catering manager Violence: Not At Risk (07/27/2017)   Humiliation, Afraid, Rape, and Kick questionnaire    Fear of Current or Ex-Partner: No    Emotionally Abused: No    Physically Abused: No    Sexually Abused: No    Family History  Problem Relation Age of Onset   Hypertension Mother    Thyroid disease Mother    Breast cancer Mother 41   Colon cancer Mother 65       again at 70   Atrial fibrillation Mother    Hip fracture Mother    Skin cancer Mother    Stroke Father    Hypertension Father    Cancer Father 34       prostate   Parkinson's disease Father    Skin cancer Father    Hypertension Sister    Thyroid disease Sister    Cancer Sister        skin/ squamous cell   Lung cancer Paternal Aunt 43       smoker  Diabetes Maternal Grandmother    Stroke Maternal Grandfather     Heart disease Paternal Grandfather    Diabetes Maternal Aunt    Lymphoma Maternal Uncle    Thyroid cancer Cousin        maternal   Heart disease Maternal Uncle    Cancer Maternal Aunt    Breast cancer Maternal Aunt 75   Breast cancer Cousin 60       maternal   Breast cancer Cousin 41   Colon cancer Cousin      Current Outpatient Medications:    acetaminophen (TYLENOL) 325 MG tablet, Take 650 mg by mouth every 6 (six) hours as needed for mild pain or headache., Disp: , Rfl:    b complex vitamins capsule, Take 1 capsule by mouth daily., Disp: , Rfl:    buPROPion (WELLBUTRIN XL) 300 MG 24 hr tablet, Take 1 tablet (300 mg total) by mouth once daily, Disp: 90 tablet, Rfl: 3   calcium carbonate (OSCAL) 1500 (600 Ca) MG TABS tablet, Take by mouth 2 (two) times daily with a meal., Disp: , Rfl:    Cholecalciferol (VITAMIN D3) 10 MCG (400 UNIT) CAPS, Take by mouth., Disp: , Rfl:    citalopram (CELEXA) 40 MG tablet, Take 1 tablet (40 mg total) by mouth daily., Disp: 90 tablet, Rfl: 1   clotrimazole-betamethasone (LOTRISONE) cream, APPLY EXTERNALLY TWICE A DAY FOR 2 WEEKS, Disp: 45 g, Rfl: 0   diltiazem (CARDIZEM CD) 120 MG 24 hr capsule, Take 1 capsule by mouth daily., Disp: 90 capsule, Rfl: 2   diphenhydrAMINE (BENADRYL) 25 MG tablet, Take 25 mg by mouth at bedtime as needed for sleep. Pt states daily now for sleep and allergies., Disp: , Rfl:    gabapentin (NEURONTIN) 100 MG capsule, Take 2 capsules (200 mg total) by mouth 3 (three) times daily., Disp: 180 capsule, Rfl: 3   ketotifen (ZADITOR) 0.025 % ophthalmic solution, Place 1 drop into both eyes daily., Disp: , Rfl:    letrozole (FEMARA) 2.5 MG tablet, Take 1 tablet (2.5 mg total) by mouth daily., Disp: 90 tablet, Rfl: 3   lisinopril (ZESTRIL) 10 MG tablet, Take 1 tablet (10 mg total) by mouth once daily, Disp: 90 tablet, Rfl: 1   metFORMIN (GLUCOPHAGE-XR) 500 MG 24 hr tablet, TAKE 1 TABLET BY MOUTH TWICE DAILY WITH A MEAL, Disp: 180  tablet, Rfl: 1   Multiple Vitamin (MULTIVITAMIN) tablet, Take 1 tablet by mouth daily., Disp: , Rfl:    mupirocin ointment (BACTROBAN) 2 %, Apply 1 application topically 2 (two) times daily., Disp: 22 g, Rfl: 0   omeprazole (PRILOSEC) 40 MG capsule, Take 1 capsule by mouth 2 times daily before a meal., Disp: 60 capsule, Rfl: 2   palbociclib (IBRANCE) 75 MG tablet, Take 1 tablet (75 mg total) by mouth daily. Take for 21 days on, 7 days off, repeat every 28 days., Disp: 21 tablet, Rfl: 1   ondansetron (ZOFRAN) 4 MG tablet, Take 1 tablet (4 mg total) by mouth every 8 (eight) hours as needed for nausea or vomiting. (Patient not taking: Reported on 01/26/2022), Disp: 90 tablet, Rfl: 0   Zoster Vaccine Adjuvanted [facility]) injection, Inject into the muscle. (Patient not taking: Reported on 11/24/2021), Disp: 0.5 mL, Rfl: 1  Physical exam:  Vitals:   01/26/22 1302  BP: (!) 108/59  Pulse: 81  Resp: 18  Temp: 98.6 F (37 C)  SpO2: 97%  Weight: 273 lb 8 oz (124.1 kg)   Physical Exam Cardiovascular:  Rate and Rhythm: Normal rate and regular rhythm.     Heart sounds: Normal heart sounds.  Pulmonary:     Effort: Pulmonary effort is normal.     Breath sounds: Normal breath sounds.  Abdominal:     General: Bowel sounds are normal.     Palpations: Abdomen is soft.  Skin:    General: Skin is warm and dry.  Neurological:     Mental Status: She is alert and oriented to person, place, and time.         Latest Ref Rng & Units 01/26/2022   12:33 PM  CMP  Glucose 70 - 99 mg/dL 144   BUN 6 - 20 mg/dL 15   Creatinine 0.44 - 1.00 mg/dL 1.06   Sodium 135 - 145 mmol/L 136   Potassium 3.5 - 5.1 mmol/L 3.9   Chloride 98 - 111 mmol/L 107   CO2 22 - 32 mmol/L 22   Calcium 8.9 - 10.3 mg/dL 7.9   Total Protein 6.5 - 8.1 g/dL 6.9   Total Bilirubin 0.3 - 1.2 mg/dL 0.5   Alkaline Phos 38 - 126 U/L 83   AST 15 - 41 U/L 26   ALT 0 - 44 U/L 24       Latest Ref Rng & Units 01/26/2022   12:33 PM  CBC   WBC 4.0 - 10.5 K/uL 3.4   Hemoglobin 12.0 - 15.0 g/dL 11.7   Hematocrit 36.0 - 46.0 % 35.3   Platelets 150 - 400 K/uL 171     No images are attached to the encounter.  No results found.   Assessment and plan- Patient is a 61 y.o. female with history of ER positive metastatic breast cancer with bone metastases currently on Ibrance and letrozole.  She is here for routine follow-up visit  Mild leukopenia/neutropenia secondary to Ibrance.  ANC remains more than 1.  Continue Ibrance and letrozole until progression or toxicity.  Bone metastases: Currently on Zometa every 3 months.  She is unable to receive Zometa today given that her calcium is 7.9.  She has not been taking her calcium supplements.  We will repeat her BMP in 1 week and if her calcium is 8.5 or higher she can get Zometa at that time.  She will receive her next dose today.  I will obtain a repeat bone density scan prior to her next visit with me.  Labs and scans in 3 months and I will see her thereafter for Zometa     Visit Diagnosis 1. Encounter for monitoring zoledronic acid therapy   2. High risk medication use   3. Primary malignant neoplasm of breast with metastasis ([place])      Dr. Randa Evens, MD, MPH [facility] [phone] 01/26/2022 1:07 PM

## 2022-01-27 ENCOUNTER — Other Ambulatory Visit: Payer: Self-pay

## 2022-01-29 ENCOUNTER — Encounter: Payer: Self-pay | Admitting: Oncology

## 2022-01-31 NOTE — Progress Notes (Unsigned)
PCP: Derinda Late, MD   No chief complaint on file.   HPI:      [name] is a 61 y.o. G1P0101 whose LMP was No LMP recorded. Patient has had a hysterectomy., presents today for her annual examination.  Her menses are absent due to supracx hyst. No PMB. She does have vasomotor sx, worse now with femara tx. Can't do hormones.   Sex activity: single partner, contraception - post menopausal status. She does have vaginal dryness improved with lubricants. Has noticed some tenderness to the LT of the clitoris during sex, occas with wiping/washing.   Last Pap: 01/23/20 Results were: no abnormalities /neg HPV DNA 2019.  Hx of STDs: none  Last mammogram: 08/11/21 Results were normal, repeat in 12 months. 08/20/20 Results were: cat 6 for RT axillary mass that was positive for recurrent metastatic breast cancer on bx. Pt followed by oncology at [facility] (last appt 01/26/22) and has seen [facility]. She is on femara and ibrance as well as zometa Q3 months. Has had good tx response so far. Mets documented to pelvic girdle, liver and spine. Doing fosamax for bone. Did short course radiation tx to leg due to pain. Sx improved. There is a FH of breast cancer in her mom and mat aunt. Mom also with colon cancer.  There is no FH of ovarian cancer. The patient does do self-breast exams. Pt was BRCA neg 2004 but never had update testing. MyRisk panel done 2/22 was CHEK 2 positive.  Colonoscopy: 2020 without abnormalities;  Repeat due after 5 years per NCCN guidelines. Mother with hx of colon cancer but pt found to be CHEK2 positive (increased risk of colon cancer)   Tobacco use: The patient denies current or previous tobacco use. Alcohol use: none No drug use Exercise: not active  She does get adequate calcium and Vitamin D in her diet.  Labs with PCP. DEXA with oncology, has appt 9/23.  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   Chicken pox    Depression    Fatty liver    Fatty liver    Gastric polyps    GERD (gastroesophageal reflux disease)    Gestational diabetes    prediabetes out side of having baby   Headache    migraines   Heart murmur    History of abnormal mammogram 08/2006, 01/2008, 03/2009   Hypertension    IBS (irritable bowel syndrome)    Joint disorder    hx of left ankle tendonitis, bursitis, heel spur   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 Smith/ laprascopic surgery   COLONOSCOPY  04/04/2000   COLONOSCOPY WITH PROPOFOL N/A 02/12/2019   Procedure: COLONOSCOPY WITH PROPOFOL;  Surgeon: Skulskie, Martin U, MD;  Location: [facility] ENDOSCOPY;  Service: Endoscopy;  Laterality: N/A;   ESOPHAGOGASTRODUODENOSCOPY (EGD) WITH PROPOFOL N/A 05/01/2015   Procedure: ESOPHAGOGASTRODUODENOSCOPY (EGD) WITH PROPOFOL;  Surgeon: Paul Y Oh, MD;  Location: [facility] ENDOSCOPY;  Service: Gastroenterology;  Laterality: N/A;   ESOPHAGOGASTRODUODENOSCOPY (EGD) WITH PROPOFOL N/A 02/12/2019   Procedure:  ESOPHAGOGASTRODUODENOSCOPY (EGD) WITH PROPOFOL;  Surgeon: Skulskie, Martin U, MD;  Location: [facility] ENDOSCOPY;  Service: Endoscopy;  Laterality: N/A;   EYE SURGERY  08/2012   laser surgery on retina/ DR Appenzeller   FINGER SURGERY     repair of tendon in right index, Dr. Boatright in [place]   FOOT SURGERY     Dr. Hyatt   IRRIGATION AND DEBRIDEMENT SEBACEOUS CYST     right upper back   LAPAROSCOPIC SUPRACERVICAL HYSTERECTOMY  06/2007   Dr Kincius/ adhesions/CPP/adenomyosis   MASTECTOMY PARTIAL / LUMPECTOMY Right 01/2008   with sentinal lymph node biopsy/ infiltrating ductal cancer   REPAIR PERONEAL TENDONS ANKLE  10/2019   with tarsal exostectomy and sural neuroma excision on right    SKIN CANCER EXCISION     back and calves   WISDOM TOOTH EXTRACTION  1991    Family History  Problem Relation Age of Onset   Hypertension Mother    Thyroid disease Mother    Breast cancer Mother 76   Colon cancer Mother 74       again at 87   Atrial fibrillation Mother    Hip fracture Mother    Skin cancer Mother    Stroke Father    Hypertension Father    Cancer Father 74       prostate   Parkinson's disease Father    Skin cancer Father    Hypertension Sister    Thyroid disease Sister    Cancer Sister        skin/ squamous cell   Lung cancer Paternal Aunt 80       smoker   Diabetes Maternal Grandmother    Stroke Maternal Grandfather    Heart disease Paternal Grandfather    Diabetes Maternal Aunt    Lymphoma Maternal Uncle    Thyroid cancer Cousin        maternal   Heart disease Maternal Uncle    Cancer Maternal Aunt    Breast cancer Maternal Aunt 75   Breast cancer Cousin 60       maternal   Breast cancer Cousin 49   Colon cancer Cousin     Social History   Socioeconomic History   Marital status: Married    Spouse name: Steve   Number of children: 1   Years of education: 14   Highest education level: Not on file  Occupational History   Occupation: CMA  Tobacco Use    Smoking status: Never   Smokeless tobacco: Never  Vaping Use   Vaping Use: Never used  Substance and Sexual Activity   Alcohol use: Yes    Alcohol/week: 0.0 standard drinks of alcohol    Comment: rare, 1-2 drinks per year   Drug use: No     Sexual activity: Yes    Partners: Male    Birth control/protection: Surgical    Comment: Hysterectomy   Other Topics Concern   Not on file  Social History Narrative   Lives in [place] with husband, no pets.  Works at [employer].      Diet - regular   Exercise - occasional   Social Determinants of Health   Financial Resource Strain: Not on file  Food Insecurity: Not on file  Transportation Needs: Not on file  Physical Activity: Insufficiently Active (07/27/2017)   Exercise Vital Sign    Days of Exercise per Week: 4 days    Minutes of Exercise per Session: 30 min  Stress: No Stress Concern Present (07/27/2017)   [employer] - Occupational Stress Questionnaire    Feeling of Stress : Only a little  Social Connections: Socially Integrated (07/27/2017)   Social Connection and Isolation Panel [NHANES]    Frequency of Communication with Friends and Family: More than three times a week    Frequency of Social Gatherings with Friends and Family: Once a week    Attends Religious Services: More than 4 times per year    Active Member of Clubs or Organizations: Yes    Attends Club or Organization Meetings: More than 4 times per year    Marital Status: Married  Intimate Partner Violence: Not At Risk (07/27/2017)   Humiliation, Afraid, Rape, and Kick questionnaire    Fear of Current or Ex-Partner: No    Emotionally Abused: No    Physically Abused: No    Sexually Abused: No     Current Outpatient Medications:    acetaminophen (TYLENOL) 325 MG tablet, Take 650 mg by mouth every 6 (six) hours as needed for mild pain or headache., Disp: , Rfl:    b complex vitamins capsule, Take 1 capsule by mouth daily., Disp: , Rfl:     buPROPion (WELLBUTRIN XL) 300 MG 24 hr tablet, Take 1 tablet (300 mg total) by mouth once daily, Disp: 90 tablet, Rfl: 3   calcium carbonate (OSCAL) 1500 (600 Ca) MG TABS tablet, Take by mouth 2 (two) times daily with a meal., Disp: , Rfl:    Cholecalciferol (VITAMIN D3) 10 MCG (400 UNIT) CAPS, Take by mouth., Disp: , Rfl:    citalopram (CELEXA) 40 MG tablet, Take 1 tablet (40 mg total) by mouth daily., Disp: 90 tablet, Rfl: 1   clotrimazole-betamethasone (LOTRISONE) cream, APPLY EXTERNALLY TWICE A DAY FOR 2 WEEKS, Disp: 45 g, Rfl: 0   diltiazem (CARDIZEM CD) 120 MG 24 hr capsule, Take 1 capsule by mouth daily., Disp: 90 capsule, Rfl: 2   diphenhydrAMINE (BENADRYL) 25 MG tablet, Take 25 mg by mouth at bedtime as needed for sleep. Pt states daily now for sleep and allergies., Disp: , Rfl:    gabapentin (NEURONTIN) 100 MG capsule, Take 2 capsules (200 mg total) by mouth 3 (three) times daily., Disp: 180 capsule, Rfl: 3   ketotifen (ZADITOR) 0.025 % ophthalmic solution, Place 1 drop into both eyes daily., Disp: , Rfl:    letrozole (FEMARA) 2.5 MG tablet, Take 1 tablet (2.5 mg total) by mouth daily., Disp: 90 tablet, Rfl: 3   lisinopril (ZESTRIL) 10 MG tablet, Take 1 tablet (10 mg total) by mouth once daily, Disp: 90 tablet, Rfl: 1   metFORMIN (GLUCOPHAGE-XR) 500 MG 24 hr tablet, TAKE 1 TABLET BY MOUTH TWICE DAILY WITH A MEAL, Disp: 180 tablet, Rfl: 1   Multiple Vitamin (MULTIVITAMIN) tablet, Take   1 tablet by mouth daily., Disp: , Rfl:    mupirocin ointment (BACTROBAN) 2 %, Apply 1 application topically 2 (two) times daily., Disp: 22 g, Rfl: 0   omeprazole (PRILOSEC) 40 MG capsule, Take 1 capsule by mouth 2 times daily before a meal., Disp: 60 capsule, Rfl: 2   ondansetron (ZOFRAN) 4 MG tablet, Take 1 tablet (4 mg total) by mouth every 8 (eight) hours as needed for nausea or vomiting. (Patient not taking: Reported on 01/26/2022), Disp: 90 tablet, Rfl: 0   palbociclib (IBRANCE) 75 MG tablet, Take 1 tablet  (75 mg total) by mouth daily. Take for 21 days on, 7 days off, repeat every 28 days., Disp: 21 tablet, Rfl: 1   Zoster Vaccine Adjuvanted [facility]) injection, Inject into the muscle. (Patient not taking: Reported on 11/24/2021), Disp: 0.5 mL, Rfl: 1     ROS:  Review of Systems  Constitutional:  Negative for fatigue, fever and unexpected weight change.  Respiratory:  Negative for cough, shortness of breath and wheezing.   Cardiovascular:  Negative for chest pain, palpitations and leg swelling.  Gastrointestinal:  Negative for blood in stool, constipation, diarrhea, nausea and vomiting.  Endocrine: Negative for cold intolerance, heat intolerance and polyuria.  Genitourinary:  Negative for dyspareunia, dysuria, flank pain, frequency, genital sores, hematuria, menstrual problem, pelvic pain, urgency, vaginal bleeding, vaginal discharge and vaginal pain.  Musculoskeletal:  Positive for arthralgias and back pain. Negative for joint swelling and myalgias.  Skin:  Negative for rash.  Neurological:  Positive for dizziness and headaches. Negative for syncope, light-headedness and numbness.  Hematological:  Negative for adenopathy.  Psychiatric/Behavioral:  Positive for agitation and dysphoric mood. Negative for confusion, sleep disturbance and suicidal ideas. The patient is not nervous/anxious.    BREAST: No symptoms    Objective: There were no vitals taken for this visit.   Physical Exam Constitutional:      Appearance: She is well-developed.  Genitourinary:     Vulva normal.     Right Labia: No rash, tenderness or lesions.    Left Labia: No tenderness, lesions or rash.    Vulva exam comments: NEG CLITORAL EXAM FOR TENDERNESS.     No vaginal discharge, erythema or tenderness.      Right Adnexa: not tender and no mass present.    Left Adnexa: not tender and no mass present.    No cervical friability or polyp.     Uterus is absent.  Breasts:    Right: No mass, nipple discharge, skin  change or tenderness.     Left: No mass, nipple discharge, skin change or tenderness.  Neck:     Thyroid: No thyromegaly.  Cardiovascular:     Rate and Rhythm: Normal rate and regular rhythm.     Heart sounds: Normal heart sounds. No murmur heard. Pulmonary:     Effort: Pulmonary effort is normal.     Breath sounds: Normal breath sounds.  Abdominal:     Palpations: Abdomen is soft.     Tenderness: There is no abdominal tenderness. There is no guarding or rebound.  Musculoskeletal:        General: Normal range of motion.     Cervical back: Normal range of motion.  Lymphadenopathy:     Cervical: No cervical adenopathy.  Neurological:     General: No focal deficit present.     Mental Status: She is alert and oriented to person, place, and time.     Cranial Nerves: No cranial nerve deficit.  Skin:  General: Skin is warm and dry.  Psychiatric:        Mood and Affect: Mood normal.        Behavior: Behavior normal.        Thought Content: Thought content normal.        Judgment: Judgment normal.  Vitals reviewed.     Assessment/Plan:  Encounter for annual routine gynecological examination  Recurrent breast cancer, right (HCC)--followed by oncology. Doing well with tx so far.           GYN counsel breast self exam, menopause, ca and Vit D.    F/U  No follow-ups on file.  Emilygrace Grothe B. Cuthbert Turton, PA-C 01/31/2022 9:34 PM

## 2022-02-01 ENCOUNTER — Encounter: Payer: Self-pay | Admitting: Obstetrics and Gynecology

## 2022-02-01 ENCOUNTER — Other Ambulatory Visit: Payer: Self-pay

## 2022-02-01 ENCOUNTER — Ambulatory Visit: Payer: 59

## 2022-02-01 DIAGNOSIS — R300 Dysuria: Secondary | ICD-10-CM

## 2022-02-01 MED ORDER — NITROFURANTOIN MONOHYD MACRO 100 MG PO CAPS
100.0000 mg | ORAL_CAPSULE | Freq: Two times a day (BID) | ORAL | 0 refills | Status: DC
  Filled 2022-02-01: qty 14, 7d supply, fill #0

## 2022-02-03 ENCOUNTER — Ambulatory Visit: Payer: 59 | Admitting: Obstetrics and Gynecology

## 2022-02-03 ENCOUNTER — Other Ambulatory Visit: Payer: 59

## 2022-02-03 ENCOUNTER — Ambulatory Visit: Payer: 59

## 2022-02-03 LAB — URINE CULTURE

## 2022-02-04 ENCOUNTER — Encounter: Payer: Self-pay | Admitting: Obstetrics and Gynecology

## 2022-02-04 ENCOUNTER — Ambulatory Visit (INDEPENDENT_AMBULATORY_CARE_PROVIDER_SITE_OTHER): Payer: 59 | Admitting: Obstetrics and Gynecology

## 2022-02-04 VITALS — BP 120/80 | Ht 63.0 in | Wt 274.0 lb

## 2022-02-04 DIAGNOSIS — Z1231 Encounter for screening mammogram for malignant neoplasm of breast: Secondary | ICD-10-CM

## 2022-02-04 DIAGNOSIS — Z01419 Encounter for gynecological examination (general) (routine) without abnormal findings: Secondary | ICD-10-CM | POA: Diagnosis not present

## 2022-02-04 DIAGNOSIS — Z87440 Personal history of urinary (tract) infections: Secondary | ICD-10-CM | POA: Diagnosis not present

## 2022-02-04 DIAGNOSIS — C50911 Malignant neoplasm of unspecified site of right female breast: Secondary | ICD-10-CM

## 2022-02-04 NOTE — Patient Instructions (Signed)
I value your feedback and you entrusting us with your care. If you get a [facility] patient survey, I would appreciate you taking the time to let us know about your experience today. Thank you! ? ? ?

## 2022-02-05 ENCOUNTER — Inpatient Hospital Stay: Payer: 59

## 2022-02-05 VITALS — BP 122/73 | HR 86 | Temp 98.9°F | Resp 18

## 2022-02-05 DIAGNOSIS — C7951 Secondary malignant neoplasm of bone: Secondary | ICD-10-CM | POA: Diagnosis not present

## 2022-02-05 DIAGNOSIS — Z79811 Long term (current) use of aromatase inhibitors: Secondary | ICD-10-CM | POA: Diagnosis not present

## 2022-02-05 DIAGNOSIS — Z170 Estrogen receptor positive status [ER+]: Secondary | ICD-10-CM | POA: Diagnosis not present

## 2022-02-05 DIAGNOSIS — C50919 Malignant neoplasm of unspecified site of unspecified female breast: Secondary | ICD-10-CM

## 2022-02-05 DIAGNOSIS — C50911 Malignant neoplasm of unspecified site of right female breast: Secondary | ICD-10-CM | POA: Diagnosis not present

## 2022-02-05 LAB — BASIC METABOLIC PANEL
Anion gap: 9 (ref 5–15)
BUN: 19 mg/dL (ref 6–20)
CO2: 20 mmol/L — ABNORMAL LOW (ref 22–32)
Calcium: 8.8 mg/dL — ABNORMAL LOW (ref 8.9–10.3)
Chloride: 107 mmol/L (ref 98–111)
Creatinine, Ser: 1.14 mg/dL — ABNORMAL HIGH (ref 0.44–1.00)
GFR, Estimated: 55 mL/min — ABNORMAL LOW (ref >60)
Glucose, Bld: 147 mg/dL — ABNORMAL HIGH (ref 70–99)
Potassium: 4 mmol/L (ref 3.5–5.1)
Sodium: 136 mmol/L (ref 135–145)

## 2022-02-05 MED ORDER — ZOLEDRONIC ACID 4 MG/100ML IV SOLN
4.0000 mg | INTRAVENOUS | Status: DC
  Administered 2022-02-05: 4 mg via INTRAVENOUS
  Filled 2022-02-05: qty 100

## 2022-02-09 ENCOUNTER — Other Ambulatory Visit (HOSPITAL_COMMUNITY): Payer: Self-pay

## 2022-02-15 ENCOUNTER — Telehealth: Payer: Self-pay | Admitting: *Deleted

## 2022-02-15 ENCOUNTER — Other Ambulatory Visit: Payer: Self-pay | Admitting: Gastroenterology

## 2022-02-15 ENCOUNTER — Other Ambulatory Visit (HOSPITAL_COMMUNITY): Payer: Self-pay

## 2022-02-15 DIAGNOSIS — R1013 Epigastric pain: Secondary | ICD-10-CM

## 2022-02-15 NOTE — Telephone Encounter (Addendum)
Yes, , she took some ibuprofen and the pain is no better or worse and she states she has had this before in the past and it went away then. It started at 5 PM last evening and she slept well last night. She is also feeling fatigued

## 2022-02-15 NOTE — Telephone Encounter (Signed)
Patient called reporting that she is having pain just below her ribs and diaphragm in the front and below her shoulder blades in the back. She is unsure where her cancer is in her spine or if it might be her heart. She is asking what to do. Please advise

## 2022-02-15 NOTE — Telephone Encounter (Signed)
Last office visit 11/24/2021  Last office visit Plan Chronic epigastric pain Discussed about H. pylori breath test Increase omeprazole to 40 mg p.o. twice daily for 1-3 months Discussed with patient that if her symptoms are persistent, we will have to repeat upper endoscopy Encouraged her to lose weight Patient's last EGD was in 2020 which was unremarkable  Last refill 05/16/232 refills  Please advise if she still needs twice a day

## 2022-02-15 NOTE — Telephone Encounter (Signed)
I told  her that someone would call her with appointment for [facility] I told her if it gets worse to go ER. She will await a call with appointment

## 2022-02-16 ENCOUNTER — Encounter: Payer: Self-pay | Admitting: Oncology

## 2022-02-16 ENCOUNTER — Other Ambulatory Visit: Payer: Self-pay

## 2022-02-16 ENCOUNTER — Encounter: Payer: Self-pay | Admitting: Gastroenterology

## 2022-02-16 ENCOUNTER — Ambulatory Visit
Admission: RE | Admit: 2022-02-16 | Discharge: 2022-02-16 | Disposition: A | Discharge status: Home / Self Care | Payer: 59 | Source: Ambulatory Visit | Attending: Hospice and Palliative Medicine | Admitting: Hospice and Palliative Medicine

## 2022-02-16 ENCOUNTER — Other Ambulatory Visit (HOSPITAL_COMMUNITY): Payer: Self-pay

## 2022-02-16 ENCOUNTER — Encounter: Payer: Self-pay | Admitting: Hospice and Palliative Medicine

## 2022-02-16 ENCOUNTER — Other Ambulatory Visit: Payer: Self-pay | Admitting: Oncology

## 2022-02-16 ENCOUNTER — Inpatient Hospital Stay: Payer: 59 | Attending: Oncology | Admitting: Hospice and Palliative Medicine

## 2022-02-16 ENCOUNTER — Encounter: Payer: Self-pay | Admitting: *Deleted

## 2022-02-16 ENCOUNTER — Ambulatory Visit
Admission: RE | Admit: 2022-02-16 | Discharge: 2022-02-16 | Disposition: A | Discharge status: Home / Self Care | Payer: 59 | Attending: Family Medicine | Admitting: Family Medicine

## 2022-02-16 VITALS — BP 120/74 | HR 80 | Temp 97.4°F | Resp 20 | Ht 63.0 in | Wt 266.0 lb

## 2022-02-16 DIAGNOSIS — Z170 Estrogen receptor positive status [ER+]: Secondary | ICD-10-CM | POA: Diagnosis not present

## 2022-02-16 DIAGNOSIS — M549 Dorsalgia, unspecified: Secondary | ICD-10-CM | POA: Insufficient documentation

## 2022-02-16 DIAGNOSIS — C50911 Malignant neoplasm of unspecified site of right female breast: Secondary | ICD-10-CM | POA: Diagnosis not present

## 2022-02-16 DIAGNOSIS — G893 Neoplasm related pain (acute) (chronic): Secondary | ICD-10-CM | POA: Diagnosis not present

## 2022-02-16 DIAGNOSIS — M419 Scoliosis, unspecified: Secondary | ICD-10-CM | POA: Diagnosis not present

## 2022-02-16 DIAGNOSIS — C7951 Secondary malignant neoplasm of bone: Secondary | ICD-10-CM | POA: Diagnosis not present

## 2022-02-16 DIAGNOSIS — Z79811 Long term (current) use of aromatase inhibitors: Secondary | ICD-10-CM | POA: Diagnosis not present

## 2022-02-16 DIAGNOSIS — R079 Chest pain, unspecified: Secondary | ICD-10-CM | POA: Diagnosis not present

## 2022-02-16 DIAGNOSIS — M2578 Osteophyte, vertebrae: Secondary | ICD-10-CM | POA: Diagnosis not present

## 2022-02-16 DIAGNOSIS — M47814 Spondylosis without myelopathy or radiculopathy, thoracic region: Secondary | ICD-10-CM | POA: Diagnosis not present

## 2022-02-16 DIAGNOSIS — C50919 Malignant neoplasm of unspecified site of unspecified female breast: Secondary | ICD-10-CM

## 2022-02-16 MED ORDER — OXYCODONE-ACETAMINOPHEN 5-325 MG PO TABS
1.0000 | ORAL_TABLET | Freq: Four times a day (QID) | ORAL | 0 refills | Status: DC | PRN
  Filled 2022-02-16: qty 30, 8d supply, fill #0

## 2022-02-16 NOTE — Telephone Encounter (Signed)
CBC with Differential/Platelet Order: [id number] Status: Edited Result - FINAL    Visible to patient: Yes (seen)    Next appt: 03/02/2022 at 01:00 PM in Oncology (JOSHUA R BORDERS, NP)    Dx: High risk medication use    0 Result Notes           Component Ref Range & Units 3 wk ago 3 mo ago 4 mo ago 5 mo ago 6 mo ago 7 mo ago 8 mo ago  WBC 4.0 - 10.5 K/uL 3.4 Low   3.2 Low   4.5  4.2  3.3 Low   6.2  3.4 Low    RBC 3.87 - 5.11 MIL/uL 3.66 Low   3.86 Low   3.85 Low   3.82 Low   3.89  3.73 Low   3.80 Low    Hemoglobin 12.0 - 15.0 g/dL 11.7 Low   12.2  12.0  12.0  12.2  12.0  12.5   HCT 36.0 - 46.0 % 35.3 Low   37.2  37.1  37.2  37.6  36.7  38.3   MCV 80.0 - 100.0 fL 96.4  96.4  96.4  97.4  96.7  98.4  100.8 High    MCH 26.0 - 34.0 pg 32.0  31.6  31.2  31.4  31.4  32.2  32.9   MCHC 30.0 - 36.0 g/dL 33.1  32.8  32.3  32.3  32.4  32.7  32.6   RDW 11.5 - 15.5 % 15.7 High   16.8 High   16.8 High   16.2 High   15.9 High   14.9  15.7 High    Platelets 150 - 400 K/uL 171  238  199  184  151  193  168   nRBC 0.0 - 0.2 % 0.0  0.0  0.4 High   0.0  0.0  0.0  0.0   Neutrophils Relative % % 55  62  53  59  55  61  58   Neutro Abs 1.7 - 7.7 K/uL 1.8  2.0  2.4  2.5  1.8  3.8  1.9   Lymphocytes Relative % 32  '27  26  28  30  19  25   '$ Lymphs Abs 0.7 - 4.0 K/uL 1.1  0.9  1.2  1.2  1.0  1.1  0.9   Monocytes Relative % '9  8  17  9  11  15  14   '$ Monocytes Absolute 0.1 - 1.0 K/uL 0.3  0.2  0.8  0.4  0.4  0.9  0.5   Eosinophils Relative % '3  2  2  2  3  2  2   '$ Eosinophils Absolute 0.0 - 0.5 K/uL 0.1  0.1  0.1  0.1  0.1  0.1  0.1   Basophils Relative % '1  1  1  1  1  1  1   '$ Basophils Absolute 0.0 - 0.1 K/uL 0.0  0.0  0.1  0.0  0.0  0.1  0.0   WBC Morphology  DIFF CONFIRMED BY MANUAL. VC         Comment: CORRECTED REPORT CALLED TO KATHERINE G AT 13:05 ON 7.18.2023 BY [facility]  CORRECTED ON 07/18 AT 1332: PREVIOUSLY REPORTED AS MILD LEFT SHIFT (1 5% METAS, OCC MYELO, OCC BANDS) DIFF CONFIRMED BY MANUAL.   RBC Morphology   MORPHOLOGY UNREMARKABLE VC         Comment: CORRECTED ON 07/18 AT 1332: PREVIOUSLY REPORTED AS MIXED RBC  POPULATION WITH RARE NRBCS NOTED.  Smear Review  Normal platelet morphology         Comment: PLATELETS APPEAR ADEQUATE  Immature Granulocytes % 0  0  1  1  0  2  0   Abs Immature Granulocytes 0.00 - 0.07 K/uL 0.01  0.01 CM  0.04 CM  0.02 CM  0.01 CM  0.09 High  CM  0.01 CM   Polychromasia  PRESENT         Comment: Performed at [facility], [address] [place], [place] [address]  Resulting Agency  [facility] [place] [facility] [place] [facility] [place] [facility] [place] [facility] [place] [facility] [place] [facility]         Specimen Collected: 01/26/22 12:33 Last Resulted: 01/26/22 13:32        Basic metabolic panel Order: [id number] Status: Final result    Visible to patient: Yes (seen)    Next appt: 03/02/2022 at 01:00 PM in Oncology (JOSHUA R BORDERS, NP)    Dx: Primary malignant neoplasm of breast ...    0 Result Notes           Component Ref Range & Units 11 d ago 3 wk ago 3 mo ago 4 mo ago 5 mo ago 6 mo ago 7 mo ago  Sodium 135 - 145 mmol/L 136  136  135  135  135  135  135   Potassium 3.5 - 5.1 mmol/L 4.0  3.9  4.0  4.4  4.5  4.0  4.4   Chloride 98 - 111 mmol/L 107  107  106  106  103  102  102   CO2 22 - 32 mmol/L 20 Low   22  21 Low   21 Low   '24  22  24   '$ Glucose, Bld 70 - 99 mg/dL 147 High   144 High  CM  138 High  CM  88 CM  96 CM  121 High  CM  96 CM   Comment: Glucose reference range applies only to samples taken after fasting for at least 8 hours.  BUN 6 - 20 mg/dL '19  15  20  18  23 '$ High   17  20   Creatinine, Ser 0.44 - 1.00 mg/dL 1.14 High   1.06 High   0.94  0.88  1.08 High   0.98  1.11 High    Calcium 8.9 - 10.3 mg/dL 8.8 Low   7.9 Low   8.4 Low   9.0  9.3  9.0  8.9   GFR, Estimated >60 mL/min 55 Low   >60 CM  >60 CM  >60 CM  60 Low  CM  >60 CM  58 Low  CM   Comment: (NOTE)  Calculated using the CKD-EPI Creatinine Equation (2021)   Anion gap 5 - '15 9  7 '$ CM  8 CM  8 CM  8 CM  11 CM   9 CM   Comment: Performed at [facility], [address] [place],  [address]  Resulting Agency  [facility] [place] [facility] [place] [facility] [place] [facility] [place] [facility] [place] [facility] [place] [facility]         Specimen Collected: 02/05/22 14:13 Last Resulted: 02/05/22 14:32

## 2022-02-16 NOTE — Progress Notes (Signed)
Symptom Management and [place] at [facility] Telephone:([phone] [phone]) [phone]  Patient Care Team: Derinda Late, MD as PCP - General (Family Medicine) Vickie Epley, MD as PCP - Electrophysiology (Cardiology)   NAME OF PATIENT: Jacqueline Deleon  [id number]  Feb 18, 1961   DATE OF VISIT: 02/16/22  REASON FOR CONSULT: Jacqueline Deleon is a 61 y.o. 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  Patient was initially diagnosed with stage I right breast cancer in 2009 status postlumpectomy followed by adjuvant radiation.  Patient had recurrence in right breast found during hospitalization in January 2022.  Patient is on treatment with Ibrance and letrozole, started in February 2022.  Patient is on Zometa every 3 months.   INTERVAL HISTORY: Patient last saw Dr. Janese Banks on 01/26/2022 with plan to continue Ibrance and letrozole until progression or toxicity.  Patient presents to [facility] today for evaluation of pain.   Patient reports a history of intermittent lower anterior rib pain and upper back pain that is generally short-lived and responsive to ibuprofen.  She says that she developed pain this time starting on Sunday and that the pain has persisted despite taking intermittent ibuprofen and resting.  She denies chest pain or shortness of breath.  She has had fatigue.  No other symptomatic complaints reported including fever or chills or cough or congestion.  Denies any neurologic complaints. Denies recent fevers or illnesses. Denies any easy bleeding or bruising. Reports good appetite and denies weight loss. Denies chest pain. Denies any nausea, vomiting, constipation, or diarrhea. Denies urinary complaints. Patient offers no further specific complaints today.  SOCIAL HISTORY:     reports that she has never smoked. She has never used smokeless tobacco. She reports current alcohol use.  She reports that she does not use drugs.  Patient is married lives at home with her husband.  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   Chicken pox    Depression    Fatty liver    Fatty liver    Gastric polyps    GERD (gastroesophageal reflux disease)    Gestational diabetes    prediabetes out side of having baby   Headache    migraines   Heart murmur    History of abnormal mammogram 08/2006, 01/2008, 03/2009   Hypertension    IBS (irritable bowel syndrome)    Joint disorder    hx of left ankle tendonitis, bursitis, heel spur   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   Obesity 2018   BMI 45   Pelvic pain    adhesions and adenomyosis   Personal history of radiation therapy    Rosacea     PAST SURGICAL HISTORY:  Past Surgical History:  Procedure Laterality Date   ABDOMINAL HYSTERECTOMY     BREAST BIOPSY Left 08/2006   benign fibroadenoma  BREAST BIOPSY Right 08/11/2020   [place] bx of LN, hydromarker, Invasive mammary carcinoma   BREAST EXCISIONAL BIOPSY Right 02/26/2008   right breast invasive mam ca with rad partial mastectomy    BREAST SURGERY Right    x2/ Dr Tamala Julian   CARDIAC CATHETERIZATION  2006   CESAREAN SECTION  04/02/1998   CHOLECYSTECTOMY  04/2010   Dr. Smith/ laprascopic surgery   COLONOSCOPY  04/04/2000   COLONOSCOPY WITH PROPOFOL N/A 02/12/2019   Procedure: COLONOSCOPY WITH PROPOFOL;  Surgeon: Lollie Sails, MD;  Location: [facility] ENDOSCOPY;  Service:  Endoscopy;  Laterality: N/A;   ESOPHAGOGASTRODUODENOSCOPY (EGD) WITH PROPOFOL N/A 05/01/2015   Procedure: ESOPHAGOGASTRODUODENOSCOPY (EGD) WITH PROPOFOL;  Surgeon: Hulen Luster, MD;  Location: [facility] ENDOSCOPY;  Service: Gastroenterology;  Laterality: N/A;   ESOPHAGOGASTRODUODENOSCOPY (EGD) WITH PROPOFOL N/A 02/12/2019   Procedure: ESOPHAGOGASTRODUODENOSCOPY (EGD) WITH PROPOFOL;  Surgeon: Lollie Sails, MD;  Location: [facility] ENDOSCOPY;  Service: Endoscopy;  Laterality: N/A;   EYE SURGERY  08/2012   laser surgery on retina/ DR Appenzeller   FINGER SURGERY     repair of tendon in right index, Dr. Francesco Sor in [facility]   FOOT SURGERY     Dr. Milinda Pointer   IRRIGATION AND DEBRIDEMENT SEBACEOUS CYST     right upper back   LAPAROSCOPIC SUPRACERVICAL HYSTERECTOMY  06/2007   Dr Kincius/ adhesions/CPP/adenomyosis   MASTECTOMY PARTIAL / LUMPECTOMY Right 01/2008   with sentinal lymph node biopsy/ infiltrating ductal cancer   REPAIR PERONEAL TENDONS ANKLE  10/2019   with tarsal exostectomy and sural neuroma excision on right    SKIN CANCER EXCISION     back and calves   WISDOM TOOTH EXTRACTION  1991    HEMATOLOGY/ONCOLOGY HISTORY:  Oncology History  Primary malignant neoplasm of breast with metastasis ([place])  08/19/2020 Cancer Staging   Staging form: Breast, AJCC 8th Edition - Clinical stage from 08/19/2020: Stage IV (rcTX, cNX, cM1, G3, ER+, PR+, HER2-) - Signed by Sindy Guadeloupe, MD on 08/23/2020 Stage prefix: Recurrence   08/23/2020 Initial Diagnosis   Metastatic breast cancer ([place])   08/23/2020 -  Chemotherapy    Patient is on Treatment Plan: BREAST PALBOCICLIB / LETROZOLE Q28D        ALLERGIES:  is allergic to kiwi extract, clorox nasal antiseptic [povidone-iodine], codeine, grapeseed extract [nutritional supplements], amoxicillin, erythromycin, sulfa antibiotics, and tape.  MEDICATIONS:  Current Outpatient Medications  Medication Sig Dispense Refill   acetaminophen (TYLENOL) 325 MG tablet Take  650 mg by mouth every 6 (six) hours as needed for mild pain or headache.     b complex vitamins capsule Take 1 capsule by mouth daily.     buPROPion (WELLBUTRIN XL) 300 MG 24 hr tablet Take 1 tablet (300 mg total) by mouth once daily 90 tablet 3   calcium carbonate (OSCAL) 1500 (600 Ca) MG TABS tablet Take by mouth 2 (two) times daily with a meal.     Cholecalciferol (VITAMIN D3) 10 MCG (400 UNIT) CAPS Take by mouth.     citalopram (CELEXA) 40 MG tablet Take 1 tablet (40 mg total) by mouth daily. 90 tablet 1   clotrimazole-betamethasone (LOTRISONE) cream APPLY EXTERNALLY TWICE A DAY FOR 2 WEEKS 45 g 0   diltiazem (CARDIZEM CD) 120 MG 24 hr capsule Take 1 capsule by mouth daily. 90 capsule 2   diphenhydrAMINE (BENADRYL) 25 MG tablet Take 25 mg by mouth at bedtime as needed for sleep. Pt states daily now for sleep and allergies.     gabapentin (  NEURONTIN) 100 MG capsule Take 2 capsules (200 mg total) by mouth 3 (three) times daily. 180 capsule 3   ketotifen (ZADITOR) 0.025 % ophthalmic solution Place 1 drop into both eyes daily.     letrozole (FEMARA) 2.5 MG tablet Take 1 tablet (2.5 mg total) by mouth daily. 90 tablet 3   lisinopril (ZESTRIL) 10 MG tablet Take 1 tablet (10 mg total) by mouth once daily 90 tablet 1   metFORMIN (GLUCOPHAGE-XR) 500 MG 24 hr tablet TAKE 1 TABLET BY MOUTH TWICE DAILY WITH A MEAL 180 tablet 1   Multiple Vitamin (MULTIVITAMIN) tablet Take 1 tablet by mouth daily.     omeprazole (PRILOSEC) 40 MG capsule Take 1 capsule by mouth 2 times daily before a meal. 60 capsule 2   ondansetron (ZOFRAN) 4 MG tablet Take 1 tablet (4 mg total) by mouth every 8 (eight) hours as needed for nausea or vomiting. 90 tablet 0   palbociclib (IBRANCE) 75 MG tablet Take 1 tablet (75 mg total) by mouth daily. Take for 21 days on, 7 days off, repeat every 28 days. 21 tablet 1   Zoster Vaccine Adjuvanted [facility]) injection Inject into the muscle. 0.5 mL 1   No current facility-administered  medications for this visit.    VITAL SIGNS: BP 120/74   Pulse 80   Temp (!) 97.4 F (36.3 C) (Tympanic)   Resp 20   Ht _0  (1.6 m)   Wt 266 lb (120.7 kg)   SpO2 98%   BMI 47.12 kg/m  Filed Weights   02/16/22 0937  Weight: 266 lb (120.7 kg)    Estimated body mass index is 47.12 kg/m as calculated from the following:   Height as of this encounter: _1  (1.6 m).   Weight as of this encounter: 266 lb (120.7 kg).  LABS: CBC:    Component Value Date/Time   WBC 3.4 (L) 01/26/2022 1233   HGB 11.7 (L) 01/26/2022 1233   HGB 11.9 03/06/2020 0811   HCT 35.3 (L) 01/26/2022 1233   HCT 38.6 03/06/2020 0811   PLT 171 01/26/2022 1233   PLT 253 03/06/2020 0811   MCV 96.4 01/26/2022 1233   MCV 80 03/06/2020 0811   NEUTROABS 1.8 01/26/2022 1233   NEUTROABS 4.7 03/06/2020 0811   LYMPHSABS 1.1 01/26/2022 1233   LYMPHSABS 1.6 03/06/2020 0811   MONOABS 0.3 01/26/2022 1233   EOSABS 0.1 01/26/2022 1233   EOSABS 0.1 03/06/2020 0811   BASOSABS 0.0 01/26/2022 1233   BASOSABS 0.0 03/06/2020 0811   Comprehensive Metabolic Panel:    Component Value Date/Time   NA 136 02/05/2022 1413   NA 141 03/06/2020 0811   K 4.0 02/05/2022 1413   CL 107 02/05/2022 1413   CO2 20 (L) 02/05/2022 1413   BUN 19 02/05/2022 1413   BUN 18 03/06/2020 0811   CREATININE 1.14 (H) 02/05/2022 1413   GLUCOSE 147 (H) 02/05/2022 1413   CALCIUM 8.8 (L) 02/05/2022 1413   AST 26 01/26/2022 1233   ALT 24 01/26/2022 1233   ALKPHOS 83 01/26/2022 1233   BILITOT 0.5 01/26/2022 1233   BILITOT <0.2 03/06/2020 0811   PROT 6.9 01/26/2022 1233   PROT 6.6 03/06/2020 0811   ALBUMIN 3.7 01/26/2022 1233   ALBUMIN 4.2 03/06/2020 0811    RADIOGRAPHIC STUDIES: No results found.  PERFORMANCE STATUS (ECOG) : 1 - Symptomatic but completely ambulatory  Review of Systems Unless otherwise noted, a complete review of systems is negative.  Physical Exam General: NAD Cardiovascular:  regular rate and rhythm Pulmonary: clear  ant fields Abdomen: soft, nontender, + bowel sounds GU: no suprapubic tenderness Extremities: no edema, no joint deformities Skin: no rashes Neurological: Weakness but otherwise nonfocal  IMPRESSION: Pain appears to be isolated to the lower anterior ribs and back/beneath scapula area.  Back pain appears to be reproducible with palpation.  Vitals are stable without any shortness of breath, tachycardia, or hypoxia.  Low suspicion for PE or vascular etiology.    EKG today without evidence of acute ischemia or ACS.  Etiology of pain certainly could be secondary to cancer with known bone metastasis.  We will send for x-rays of chest/thoracic spine.  If negative, could consider obtaining CTs/bone scan sooner.  Last bone scan May 2023 showed interval decrease in uptake in left acetabulum possibly suggestive of regression of metastatic disease.  There were no other abnormal foci noted at time of imaging.  Patient would like something stronger for pain.  She reports previous good tolerance of oxycodone.  Started on Percocet as needed.   PLAN: -Continue current prescription treatment -EKG -Chest x-ray/thoracic spine  -Start Percocet 5-325 mg Q6H as needed #30 -PDMP reviewed -Daily bowel regimen -Follow-up telephone visit 1-2 weeks   Patient expressed understanding and was in agreement with this plan. She also understands that She can call clinic at any time with any questions, concerns, or complaints.   Thank you for allowing me to participate in the care of this very pleasant patient.   Time Total: 20 minutes  Visit consisted of counseling and education dealing with the complex and emotionally intense issues of symptom management in the setting of serious illness.Greater than 50%  of this time was spent counseling and coordinating care related to the above assessment and plan.  Signed by: Altha Harm, PhD, NP-C

## 2022-02-16 NOTE — Telephone Encounter (Signed)
Spoke with patient 02/15/22 at 445 pm. Pt provided with apt. She has no other symptoms other than the rib/shoulder discomfort.

## 2022-02-17 ENCOUNTER — Telehealth: Payer: Self-pay | Admitting: *Deleted

## 2022-02-17 ENCOUNTER — Other Ambulatory Visit (HOSPITAL_COMMUNITY): Payer: Self-pay

## 2022-02-17 MED ORDER — OMEPRAZOLE 20 MG PO CPDR
20.0000 mg | DELAYED_RELEASE_CAPSULE | Freq: Every day | ORAL | 0 refills | Status: DC
Start: 2022-02-17 — End: 2022-05-11
  Filled 2022-02-17: qty 90, 90d supply, fill #0

## 2022-02-17 NOTE — Telephone Encounter (Addendum)
I reached out to patient to follow-up on her pain and xray results. Explained to patient that her x-rays did not demonstrate any findings of cancer. I explained that her Thoracic x-rays only showed degenerative changes. Patient states that she only took a half of a percocet last evening and is feeling some better today. She would like to wait on moving her appts for upcoming imaging. Scans are currently scheduled for October. Patient requested to wait until October to get scans as planned. She will call our office if her pain gets worse.

## 2022-02-18 ENCOUNTER — Other Ambulatory Visit: Payer: Self-pay | Admitting: Oncology

## 2022-02-18 ENCOUNTER — Other Ambulatory Visit (HOSPITAL_COMMUNITY): Payer: Self-pay

## 2022-02-18 DIAGNOSIS — C50919 Malignant neoplasm of unspecified site of unspecified female breast: Secondary | ICD-10-CM

## 2022-02-18 NOTE — Telephone Encounter (Signed)
CBC with Differential/Platelet Order: [id number] Status: Edited Result - FINAL    Visible to patient: Yes (seen)    Next appt: 03/02/2022 at 01:00 PM in Oncology (JOSHUA R BORDERS, NP)    Dx: High risk medication use    0 Result Notes           Component Ref Range & Units 3 wk ago 3 mo ago 4 mo ago 5 mo ago 6 mo ago 7 mo ago 8 mo ago  WBC 4.0 - 10.5 K/uL 3.4 Low   3.2 Low   4.5  4.2  3.3 Low   6.2  3.4 Low    RBC 3.87 - 5.11 MIL/uL 3.66 Low   3.86 Low   3.85 Low   3.82 Low   3.89  3.73 Low   3.80 Low    Hemoglobin 12.0 - 15.0 g/dL 11.7 Low   12.2  12.0  12.0  12.2  12.0  12.5   HCT 36.0 - 46.0 % 35.3 Low   37.2  37.1  37.2  37.6  36.7  38.3   MCV 80.0 - 100.0 fL 96.4  96.4  96.4  97.4  96.7  98.4  100.8 High    MCH 26.0 - 34.0 pg 32.0  31.6  31.2  31.4  31.4  32.2  32.9   MCHC 30.0 - 36.0 g/dL 33.1  32.8  32.3  32.3  32.4  32.7  32.6   RDW 11.5 - 15.5 % 15.7 High   16.8 High   16.8 High   16.2 High   15.9 High   14.9  15.7 High    Platelets 150 - 400 K/uL 171  238  199  184  151  193  168   nRBC 0.0 - 0.2 % 0.0  0.0  0.4 High   0.0  0.0  0.0  0.0   Neutrophils Relative % % 55  62  53  59  55  61  58   Neutro Abs 1.7 - 7.7 K/uL 1.8  2.0  2.4  2.5  1.8  3.8  1.9   Lymphocytes Relative % 32  '27  26  28  30  19  25   '$ Lymphs Abs 0.7 - 4.0 K/uL 1.1  0.9  1.2  1.2  1.0  1.1  0.9   Monocytes Relative % '9  8  17  9  11  15  14   '$ Monocytes Absolute 0.1 - 1.0 K/uL 0.3  0.2  0.8  0.4  0.4  0.9  0.5   Eosinophils Relative % '3  2  2  2  3  2  2   '$ Eosinophils Absolute 0.0 - 0.5 K/uL 0.1  0.1  0.1  0.1  0.1  0.1  0.1   Basophils Relative % '1  1  1  1  1  1  1   '$ Basophils Absolute 0.0 - 0.1 K/uL 0.0  0.0  0.1  0.0  0.0  0.1  0.0   WBC Morphology  DIFF CONFIRMED BY MANUAL. VC         Comment: CORRECTED REPORT CALLED TO KATHERINE G AT 13:05 ON 7.18.2023 BY KMR  CORRECTED ON 07/18 AT 1332: PREVIOUSLY REPORTED AS MILD LEFT SHIFT (1 5% METAS, OCC MYELO, OCC BANDS) DIFF CONFIRMED BY MANUAL.   RBC Morphology   MORPHOLOGY UNREMARKABLE VC         Comment: CORRECTED ON 07/18 AT 1332: PREVIOUSLY REPORTED AS MIXED RBC  POPULATION WITH RARE NRBCS NOTED.  Smear Review  Normal platelet morphology         Comment: PLATELETS APPEAR ADEQUATE  Immature Granulocytes % 0  0  1  1  0  2  0   Abs Immature Granulocytes 0.00 - 0.07 K/uL 0.01  0.01 CM  0.04 CM  0.02 CM  0.01 CM  0.09 High  CM  0.01 CM   Polychromasia  PRESENT         Comment: Performed at [facility], [address] [place], [place] [address]  Resulting Agency  [place] [facility] [place] [facility] [place] [facility] [place] [facility] [place] [facility] [place] [facility] [place] [facility]         Specimen Collected: 01/26/22 12:33 Last Resulted: 01/26/22 13:32      Lab Flowsheet    Order Details    View Encounter    Lab and Collection Details    Routing    Result History    View All Conversations on this Encounter      VC=Value has a corrected status   CM=Additional comments      Result Care Coordination   Patient Communication   Add Comments   Seen Back to Top       Other Results from 01/26/2022   Contains abnormal data Comprehensive metabolic panel Order: [id number] Status: Final result    Visible to patient: Yes (seen)    Next appt: 03/02/2022 at 01:00 PM in Oncology (JOSHUA R BORDERS, NP)    Dx: High risk medication use    0 Result Notes           Component Ref Range & Units 3 wk ago 3 mo ago 4 mo ago 5 mo ago 6 mo ago 7 mo ago 8 mo ago  Sodium 135 - 145 mmol/L 136  135  135  135  135  135  139   Potassium 3.5 - 5.1 mmol/L 3.9  4.0  4.4  4.5  4.0  4.4  4.3   Chloride 98 - 111 mmol/L 107  106  106  103  102  102  105   CO2 22 - 32 mmol/L 22  21 Low   21 Low   '24  22  24  23   '$ Glucose, Bld 70 - 99 mg/dL 144 High   138 High  CM  88 CM  96 CM  121 High  CM  96 CM  110 High  CM   Comment: Glucose reference range applies only to samples taken after fasting for at least 8 hours.  BUN 6 - 20 mg/dL '15  20  18  23 '$ High   '17  20  22 '$ High    Creatinine, Ser 0.44 - 1.00  mg/dL 1.06 High   0.94  0.88  1.08 High   0.98  1.11 High   1.20 High    Calcium 8.9 - 10.3 mg/dL 7.9 Low   8.4 Low   9.0  9.3  9.0  8.9  9.4   Total Protein 6.5 - 8.1 g/dL 6.9  7.3  7.5  7.9  7.2  7.4  7.3   Albumin 3.5 - 5.0 g/dL 3.7  3.9  4.2  4.3  4.0  3.9  4.1   AST 15 - 41 U/L '26  22  23  20  24  23  20   '$ ALT 0 - 44 U/L '24  20  21  '$ 19  $'18  27  20   'K$ Alkaline Phosphatase 38 - 126 U/L 83  90  88  104  84  103  91   Total Bilirubin 0.3 - 1.2 mg/dL 0.5  0.7  0.4  0.3  0.2 Low   0.4  <0.1 Low    GFR, Estimated >60 mL/min >60  >60 CM  >60 CM  60 Low  CM  >60 CM  58 Low  CM  52 Low  CM   Comment: (NOTE)  Calculated using the CKD-EPI Creatinine Equation (2021)   Anion gap 5 - '15 7  8 '$ CM  8 CM  8 CM  11 CM  9 CM  11 CM   Comment: Performed at [facility], [address] [place], [place] [address]  Resulting Agency  [facility] [place] [facility] [place] [facility] [place] [facility] [place] [facility] [place] [facility] [place] [facility]         Specimen Collected: 01/26/22 12:33 Last Resulted: 01/26/22 13:01

## 2022-02-19 ENCOUNTER — Telehealth: Payer: Self-pay | Admitting: *Deleted

## 2022-02-19 ENCOUNTER — Other Ambulatory Visit (HOSPITAL_COMMUNITY): Payer: Self-pay

## 2022-02-19 ENCOUNTER — Other Ambulatory Visit: Payer: Self-pay | Admitting: Oncology

## 2022-02-19 DIAGNOSIS — C50919 Malignant neoplasm of unspecified site of unspecified female breast: Secondary | ICD-10-CM

## 2022-02-19 MED ORDER — PALBOCICLIB 75 MG PO TABS
75.0000 mg | ORAL_TABLET | Freq: Every day | ORAL | 1 refills | Status: DC
  Filled 2022-02-19: qty 21, 28d supply, fill #0
  Filled 2022-03-12: qty 21, 28d supply, fill #1

## 2022-02-19 NOTE — Telephone Encounter (Signed)
Call returned to patient and informed of PA response. She thanked me for letting her know and stated, "sounds like a plan."

## 2022-02-19 NOTE — Telephone Encounter (Signed)
Attempted to reach patient. No answer. Left msg that I am returning pt's phone call and I will send her a mychart msg.

## 2022-02-19 NOTE — Telephone Encounter (Signed)
Patient called stating that she is home from work again today with weakness and that she has just not felt well since the Zometa infusion. She is asking if Zometa is the cause and what can be done for her. Please advise

## 2022-02-22 ENCOUNTER — Encounter: Payer: Self-pay | Admitting: Hospice and Palliative Medicine

## 2022-02-22 ENCOUNTER — Other Ambulatory Visit (HOSPITAL_COMMUNITY): Payer: Self-pay

## 2022-02-22 ENCOUNTER — Encounter: Payer: Self-pay | Admitting: *Deleted

## 2022-03-02 ENCOUNTER — Inpatient Hospital Stay (HOSPITAL_BASED_OUTPATIENT_CLINIC_OR_DEPARTMENT_OTHER): Payer: 59 | Admitting: Hospice and Palliative Medicine

## 2022-03-02 DIAGNOSIS — Z515 Encounter for palliative care: Secondary | ICD-10-CM

## 2022-03-02 DIAGNOSIS — G893 Neoplasm related pain (acute) (chronic): Secondary | ICD-10-CM | POA: Diagnosis not present

## 2022-03-02 NOTE — Progress Notes (Signed)
Virtual Visit via Telephone Note  I connected with KRITIKA STUKES on 03/02/22 at  1:00 PM EDT by telephone and verified that I am speaking with the correct person using two identifiers.  Location: Patient: Home Provider: Home   I discussed the limitations, risks, security and privacy concerns of performing an evaluation and management service by telephone and the availability of in person appointments. I also discussed with the patient that there may be a patient responsible charge related to this service. The patient expressed understanding and agreed to proceed.   History of Present Illness: ANANI GU is a 61 y.o. 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  Patient was initially diagnosed with stage I right breast cancer in 2009 status postlumpectomy followed by adjuvant radiation.  Patient had recurrence in right breast found during hospitalization in January 2022.   Patient is on treatment with Ibrance and letrozole, started in February 2022.  Patient is on Zometa every 3 months.   Observations/Objective: I called and spoke with patient by phone.   She reports doing reasonably well without significant changes or concerns. She says that the pain is improved. She has found benefit from taking the Percocet but has not needed those regularly. She says the pain seems most severe when she is tired but overall feels better than she did.   No adverse effects reported from pain meds.   We disccussed the results of her imaging and EKG. I had previously communicated with her cardiologist who was also able to review her EKG.   Assessment and Plan: Neoplasm related - continue as needed Percocet  Follow Up Instructions: Follow up telephone visit 1 month   I discussed the assessment and treatment plan with the patient. The patient was provided an opportunity to ask questions and all were answered. The patient agreed with the plan  and demonstrated an understanding of the instructions.   The patient was advised to call back or seek an in-person evaluation if the symptoms worsen or if the condition fails to improve as anticipated.  I provided 5 minutes of non-face-to-face time during this encounter.   Irean Hong, NP

## 2022-03-11 ENCOUNTER — Other Ambulatory Visit (HOSPITAL_COMMUNITY): Payer: Self-pay

## 2022-03-12 ENCOUNTER — Other Ambulatory Visit (HOSPITAL_COMMUNITY): Payer: Self-pay

## 2022-03-23 ENCOUNTER — Other Ambulatory Visit (HOSPITAL_COMMUNITY): Payer: Self-pay

## 2022-03-29 ENCOUNTER — Other Ambulatory Visit (HOSPITAL_COMMUNITY): Payer: Self-pay

## 2022-03-29 MED ORDER — CITALOPRAM HYDROBROMIDE 40 MG PO TABS
40.0000 mg | ORAL_TABLET | Freq: Every day | ORAL | 1 refills | Status: DC
  Filled 2022-03-29: qty 90, 90d supply, fill #0
  Filled 2022-06-24: qty 90, 90d supply, fill #1

## 2022-03-30 ENCOUNTER — Inpatient Hospital Stay: Payer: 59 | Admitting: Hospice and Palliative Medicine

## 2022-03-30 ENCOUNTER — Other Ambulatory Visit (HOSPITAL_COMMUNITY): Payer: Self-pay

## 2022-03-31 ENCOUNTER — Other Ambulatory Visit: Payer: Self-pay

## 2022-03-31 ENCOUNTER — Encounter: Payer: Self-pay | Admitting: Emergency Medicine

## 2022-03-31 ENCOUNTER — Emergency Department: Payer: 59

## 2022-03-31 ENCOUNTER — Emergency Department
Admission: EM | Admit: 2022-03-31 | Discharge: 2022-03-31 | Disposition: A | Discharge status: Home / Self Care | Payer: 59 | Attending: Emergency Medicine | Admitting: Emergency Medicine

## 2022-03-31 DIAGNOSIS — S0990XA Unspecified injury of head, initial encounter: Secondary | ICD-10-CM | POA: Diagnosis not present

## 2022-03-31 DIAGNOSIS — W19XXXA Unspecified fall, initial encounter: Secondary | ICD-10-CM

## 2022-03-31 DIAGNOSIS — Z23 Encounter for immunization: Secondary | ICD-10-CM | POA: Diagnosis not present

## 2022-03-31 DIAGNOSIS — M542 Cervicalgia: Secondary | ICD-10-CM | POA: Insufficient documentation

## 2022-03-31 DIAGNOSIS — C50919 Malignant neoplasm of unspecified site of unspecified female breast: Secondary | ICD-10-CM | POA: Diagnosis not present

## 2022-03-31 DIAGNOSIS — W108XXA Fall (on) (from) other stairs and steps, initial encounter: Secondary | ICD-10-CM | POA: Insufficient documentation

## 2022-03-31 DIAGNOSIS — R0789 Other chest pain: Secondary | ICD-10-CM | POA: Insufficient documentation

## 2022-03-31 DIAGNOSIS — S59902A Unspecified injury of left elbow, initial encounter: Secondary | ICD-10-CM | POA: Diagnosis present

## 2022-03-31 DIAGNOSIS — S50312A Abrasion of left elbow, initial encounter: Secondary | ICD-10-CM | POA: Insufficient documentation

## 2022-03-31 DIAGNOSIS — I639 Cerebral infarction, unspecified: Secondary | ICD-10-CM | POA: Diagnosis not present

## 2022-03-31 DIAGNOSIS — M4312 Spondylolisthesis, cervical region: Secondary | ICD-10-CM | POA: Diagnosis not present

## 2022-03-31 DIAGNOSIS — C50912 Malignant neoplasm of unspecified site of left female breast: Secondary | ICD-10-CM | POA: Diagnosis not present

## 2022-03-31 DIAGNOSIS — Z043 Encounter for examination and observation following other accident: Secondary | ICD-10-CM | POA: Diagnosis not present

## 2022-03-31 MED ORDER — ACETAMINOPHEN 500 MG PO TABS
1000.0000 mg | ORAL_TABLET | Freq: Once | ORAL | Status: AC
  Filled 2022-03-31: qty 2

## 2022-03-31 MED ORDER — TETANUS-DIPHTH-ACELL PERTUSSIS 5-2.5-18.5 LF-MCG/0.5 IM SUSY
0.5000 mL | PREFILLED_SYRINGE | Freq: Once | INTRAMUSCULAR | Status: AC
  Administered 2022-03-31: 0.5 mL via INTRAMUSCULAR
  Filled 2022-03-31: qty 0.5

## 2022-03-31 MED ORDER — ACETAMINOPHEN 500 MG PO TABS
ORAL_TABLET | ORAL | Status: AC
  Administered 2022-03-31: 1000 mg via ORAL
  Filled 2022-03-31: qty 2

## 2022-03-31 NOTE — Discharge Instructions (Signed)
Work up re-assuring return for fevers, dizziness any other concerns.

## 2022-03-31 NOTE — ED Triage Notes (Signed)
Fell when stepping out onto the concrete patio two steps down.  Fall onto left side. Denies head injury.  C/O headache, bilateral shoulder pain, and left elbow pain.  AAOx3.  Skin warm and dry. NAD

## 2022-03-31 NOTE — ED Provider Notes (Signed)
[facility] Provider Note    Event Date/Time   First MD Initiated Contact with Patient 03/31/22 1040     (approximate)   History   Fall   HPI  Jacqueline Deleon is a 61 y.o. female stage 4 breast cancer with mets to the bone on chemotherapy who fell out of side door.  Patient reports that she was walking out of the house when she missed a step went down on cement.  Landed left sided. Did not hit hit head. NO blood thinners. No chest pain or SOB.  However patient does report having a headache and that even though she did not hit her head she jostled her head really hard when she fell as well as some upper neck pain.  She has a little abrasion on her left elbow but denies any significant pain there.  She reports this happened earlier this morning around 7:30 AM has been up and ambulatory since but when she tried to drive to work she just did not feel right which is why she came to the emergency room to be evaluated.  Patient denies any syncope, chest pain, shortness of breath, symptoms of weakness prior to this occurring.  She reports that she just missed a step.  I reviewed the note from 03/02/2022 where patient seen by oncology   Physical Exam   Triage Vital Signs: ED Triage Vitals  Enc Vitals Group     BP 03/31/22 1014 (!) 143/86     Pulse Rate 03/31/22 1014 78     Resp 03/31/22 1014 16     Temp 03/31/22 1014 99.3 F (37.4 C)     Temp Source 03/31/22 1014 Oral     SpO2 03/31/22 1014 97 %     Weight 03/31/22 1011 266 lb 1.5 oz (120.7 kg)     Height 03/31/22 1011 '5\' 3"'$  (1.6 m)     Head Circumference --      Peak Flow --      Pain Score 03/31/22 1010 6     Pain Loc --      Pain Edu? --      Excl. in [place]? --     Most recent vital signs: Vitals:   03/31/22 1014  BP: (!) 143/86  Pulse: 78  Resp: 16  Temp: 99.3 F (37.4 C)  SpO2: 97%     General: Awake, no distress.  CV:  Good peripheral perfusion.  Resp:  Normal effort.  Abd:  No distention.   Other:  Patient has small abrasion on her left elbow but she is got full range of motion of her arms and her legs.  She is got a little bit of upper neck pain.  A little bit of left chest wall pain.   ED Results / Procedures / Treatments   Labs (all labs ordered are listed, but only abnormal results are displayed) Labs Reviewed - No data to display   I reviewed patient's blood work from July 2023 where her white count was slightly low.  Hemoglobin slightly low.  CMP shows slightly elevated creatinine around her baseline  EKG  My interpretation of EKG:  Declined   RADIOLOGY I have reviewed the xray personally and interpreted no evidence of any fractures.   PROCEDURES:  Critical Care performed: No  Procedures   MEDICATIONS ORDERED IN ED: Medications - No data to display   IMPRESSION / MDM / [place] / ED COURSE  I reviewed the triage vital  signs and the nursing notes.   Patient's presentation is most consistent with acute presentation with potential threat to life or bodily function.   Differential is intracranial hemorrhage, cervical fracture, rib fractures.  Will get x-ray, CT to evaluate.  Offered patient blood work today given she is a cancer patient but she denied any weakness chest pain, shortness of breath or other symptoms and reports that it was a mechanical fall therefore declined blood work.  We discussed her slightly elevated temperature 99 but she is adamant that she has no infectious symptoms and that sometimes she runs a little high.  She understands that given she is on chemotherapy that if she does spike a fever over 100 that she would need to return to the ER for repeat evaluation.  She reports that she is only here today because of the headache and the upper neck pain that she went to make sure there is nothing more serious going on.  CTs were ordered and x-ray were ordered and patient was given some Tylenol and tetanus was updated.   Chest  x-ray negative CT head negative.  CT cervical did have some spinal stenosis with possible disc bulge which I did discuss with patient and she will follow-up with outpatient.  Patient reports a stiff neck but has no significant midline tenderness full range of motion of neck without any numbness or tinglin.  We discussed additional medications for pain versus going home patient reports that she prefer to go home and is just requesting a note for work.     FINAL CLINICAL IMPRESSION(S) / ED DIAGNOSES   Final diagnoses:  Fall, initial encounter  Malignant neoplasm of female breast, unspecified estrogen receptor status, unspecified laterality, unspecified site of breast ([place])     Rx / DC Orders   ED Discharge Orders     None        Note:  This document was prepared using Dragon voice recognition software and may include unintentional dictation errors.   Vanessa [employer], MD 03/31/22 1220

## 2022-03-31 NOTE — ED Notes (Signed)
Pt to xray/CT now then will be brought to flex 53.

## 2022-04-01 ENCOUNTER — Ambulatory Visit
Admission: RE | Admit: 2022-04-01 | Discharge: 2022-04-01 | Disposition: A | Discharge status: Home / Self Care | Payer: 59 | Source: Ambulatory Visit | Attending: Oncology | Admitting: Oncology

## 2022-04-01 DIAGNOSIS — C50919 Malignant neoplasm of unspecified site of unspecified female breast: Secondary | ICD-10-CM | POA: Diagnosis not present

## 2022-04-01 DIAGNOSIS — Z780 Asymptomatic menopausal state: Secondary | ICD-10-CM | POA: Diagnosis not present

## 2022-04-01 DIAGNOSIS — G893 Neoplasm related pain (acute) (chronic): Secondary | ICD-10-CM | POA: Diagnosis not present

## 2022-04-12 ENCOUNTER — Other Ambulatory Visit: Payer: Self-pay | Admitting: Nurse Practitioner

## 2022-04-12 ENCOUNTER — Other Ambulatory Visit (HOSPITAL_COMMUNITY): Payer: Self-pay

## 2022-04-12 DIAGNOSIS — C50919 Malignant neoplasm of unspecified site of unspecified female breast: Secondary | ICD-10-CM

## 2022-04-16 ENCOUNTER — Other Ambulatory Visit (HOSPITAL_COMMUNITY): Payer: Self-pay

## 2022-04-23 ENCOUNTER — Other Ambulatory Visit: Payer: Self-pay

## 2022-04-23 MED ORDER — GABAPENTIN 100 MG PO CAPS
ORAL_CAPSULE | ORAL | 1 refills | Status: DC
  Filled 2022-04-23 – 2022-06-14 (×2): qty 270, 90d supply, fill #0
  Filled 2022-09-20: qty 270, 90d supply, fill #1

## 2022-04-26 ENCOUNTER — Other Ambulatory Visit (HOSPITAL_COMMUNITY): Payer: Self-pay

## 2022-04-26 MED ORDER — PALBOCICLIB 75 MG PO TABS
75.0000 mg | ORAL_TABLET | Freq: Every day | ORAL | 0 refills | Status: DC
  Filled 2022-04-26: qty 21, 28d supply, fill #0

## 2022-04-29 ENCOUNTER — Encounter
Admission: RE | Admit: 2022-04-29 | Discharge: 2022-04-29 | Disposition: A | Discharge status: Home / Self Care | Payer: 59 | Source: Ambulatory Visit | Attending: Oncology | Admitting: Oncology

## 2022-04-29 ENCOUNTER — Ambulatory Visit
Admission: RE | Admit: 2022-04-29 | Discharge: 2022-04-29 | Disposition: A | Discharge status: Home / Self Care | Payer: 59 | Source: Ambulatory Visit | Attending: Oncology | Admitting: Oncology

## 2022-04-29 DIAGNOSIS — K5730 Diverticulosis of large intestine without perforation or abscess without bleeding: Secondary | ICD-10-CM | POA: Diagnosis not present

## 2022-04-29 DIAGNOSIS — Z9049 Acquired absence of other specified parts of digestive tract: Secondary | ICD-10-CM | POA: Diagnosis not present

## 2022-04-29 DIAGNOSIS — C50919 Malignant neoplasm of unspecified site of unspecified female breast: Secondary | ICD-10-CM | POA: Insufficient documentation

## 2022-04-29 DIAGNOSIS — K760 Fatty (change of) liver, not elsewhere classified: Secondary | ICD-10-CM | POA: Diagnosis not present

## 2022-04-29 DIAGNOSIS — Z90710 Acquired absence of both cervix and uterus: Secondary | ICD-10-CM | POA: Diagnosis not present

## 2022-04-29 DIAGNOSIS — C7951 Secondary malignant neoplasm of bone: Secondary | ICD-10-CM | POA: Diagnosis not present

## 2022-04-29 MED ORDER — IOHEXOL 350 MG/ML SOLN
100.0000 mL | Freq: Once | INTRAVENOUS | Status: AC | PRN
  Administered 2022-04-29: 100 mL via INTRAVENOUS

## 2022-04-29 MED ORDER — TECHNETIUM TC 99M MEDRONATE IV KIT
22.9900 | PACK | Freq: Once | INTRAVENOUS | Status: AC | PRN
  Administered 2022-04-29: 22.99 via INTRAVENOUS

## 2022-05-04 LAB — POCT I-STAT CREATININE: Creatinine, Ser: 1.2 mg/dL — ABNORMAL HIGH (ref 0.44–1.00)

## 2022-05-05 ENCOUNTER — Other Ambulatory Visit: Payer: 59

## 2022-05-05 ENCOUNTER — Ambulatory Visit: Payer: 59 | Admitting: Oncology

## 2022-05-05 ENCOUNTER — Ambulatory Visit: Payer: 59

## 2022-05-07 DIAGNOSIS — D849 Immunodeficiency, unspecified: Secondary | ICD-10-CM | POA: Diagnosis not present

## 2022-05-10 ENCOUNTER — Other Ambulatory Visit (HOSPITAL_COMMUNITY): Payer: Self-pay

## 2022-05-11 ENCOUNTER — Other Ambulatory Visit (HOSPITAL_COMMUNITY): Payer: Self-pay

## 2022-05-11 ENCOUNTER — Other Ambulatory Visit: Payer: Self-pay

## 2022-05-11 ENCOUNTER — Other Ambulatory Visit: Payer: Self-pay | Admitting: Gastroenterology

## 2022-05-11 MED ORDER — OMEPRAZOLE 20 MG PO CPDR
20.0000 mg | DELAYED_RELEASE_CAPSULE | Freq: Every day | ORAL | 0 refills | Status: DC
  Filled 2022-05-11: qty 90, 90d supply, fill #0

## 2022-05-12 ENCOUNTER — Other Ambulatory Visit (HOSPITAL_COMMUNITY): Payer: Self-pay

## 2022-05-12 ENCOUNTER — Other Ambulatory Visit: Payer: Self-pay | Admitting: Pharmacist

## 2022-05-12 DIAGNOSIS — C50919 Malignant neoplasm of unspecified site of unspecified female breast: Secondary | ICD-10-CM

## 2022-05-12 MED ORDER — PALBOCICLIB 75 MG PO TABS
75.0000 mg | ORAL_TABLET | Freq: Every day | ORAL | 0 refills | Status: DC
  Filled 2022-05-12: qty 21, 28d supply, fill #0

## 2022-05-13 ENCOUNTER — Encounter: Payer: Self-pay | Admitting: Oncology

## 2022-05-13 DIAGNOSIS — Z79899 Other long term (current) drug therapy: Secondary | ICD-10-CM | POA: Diagnosis not present

## 2022-05-13 DIAGNOSIS — E1159 Type 2 diabetes mellitus with other circulatory complications: Secondary | ICD-10-CM | POA: Diagnosis not present

## 2022-05-13 DIAGNOSIS — I152 Hypertension secondary to endocrine disorders: Secondary | ICD-10-CM | POA: Diagnosis not present

## 2022-05-14 ENCOUNTER — Encounter: Payer: Self-pay | Admitting: Oncology

## 2022-05-14 ENCOUNTER — Inpatient Hospital Stay (HOSPITAL_BASED_OUTPATIENT_CLINIC_OR_DEPARTMENT_OTHER): Payer: 59 | Admitting: Oncology

## 2022-05-14 ENCOUNTER — Other Ambulatory Visit (HOSPITAL_COMMUNITY): Payer: Self-pay

## 2022-05-14 ENCOUNTER — Inpatient Hospital Stay: Payer: 59 | Attending: Oncology

## 2022-05-14 ENCOUNTER — Inpatient Hospital Stay: Payer: 59

## 2022-05-14 VITALS — BP 120/61 | HR 86 | Temp 96.6°F | Resp 20 | Wt 267.0 lb

## 2022-05-14 DIAGNOSIS — Z170 Estrogen receptor positive status [ER+]: Secondary | ICD-10-CM | POA: Diagnosis not present

## 2022-05-14 DIAGNOSIS — Z5181 Encounter for therapeutic drug level monitoring: Secondary | ICD-10-CM

## 2022-05-14 DIAGNOSIS — C773 Secondary and unspecified malignant neoplasm of axilla and upper limb lymph nodes: Secondary | ICD-10-CM | POA: Insufficient documentation

## 2022-05-14 DIAGNOSIS — Z79811 Long term (current) use of aromatase inhibitors: Secondary | ICD-10-CM | POA: Insufficient documentation

## 2022-05-14 DIAGNOSIS — Z7983 Long term (current) use of bisphosphonates: Secondary | ICD-10-CM | POA: Diagnosis not present

## 2022-05-14 DIAGNOSIS — Z79899 Other long term (current) drug therapy: Secondary | ICD-10-CM | POA: Diagnosis not present

## 2022-05-14 DIAGNOSIS — C7951 Secondary malignant neoplasm of bone: Secondary | ICD-10-CM | POA: Insufficient documentation

## 2022-05-14 DIAGNOSIS — C50911 Malignant neoplasm of unspecified site of right female breast: Secondary | ICD-10-CM | POA: Diagnosis not present

## 2022-05-14 DIAGNOSIS — C50919 Malignant neoplasm of unspecified site of unspecified female breast: Secondary | ICD-10-CM

## 2022-05-14 MED ORDER — SODIUM CHLORIDE 0.9 % IV SOLN
Freq: Once | INTRAVENOUS | Status: AC
  Filled 2022-05-14: qty 250

## 2022-05-14 MED ORDER — ZOLEDRONIC ACID 4 MG/100ML IV SOLN
4.0000 mg | INTRAVENOUS | Status: DC
  Administered 2022-05-14: 4 mg via INTRAVENOUS
  Filled 2022-05-14: qty 100

## 2022-05-14 NOTE — Progress Notes (Signed)
Patient states she wants to talk about her kidneys due to the results she has got.

## 2022-05-14 NOTE — Progress Notes (Signed)
Hematology/Oncology Consult note [facility]  Telephone:([phone] [phone]  Patient Care Team: Derinda Late, MD as PCP - General (Family Medicine) Vickie Epley, MD as PCP - Electrophysiology (Cardiology)   Name of the patient: Jacqueline Deleon  [id number]  [birth year]   Date of visit: 05/14/22  Diagnosis- stage IV ER positive breast cancer with bone metastases   Chief complaint/ Reason for visit-routine follow-up of breast cancer on Ibrance and to receive Zometa  Heme/Onc history: Patient is a 61 year old female with a past medical history significant for stage I right breast cancer diagnosed in 2009.  It was a 1.4 cm tumor with negative lymph nodes grade 1 and patient went on to get lumpectomy followed by adjuvant radiation therapy.  Oncotype DX score was 6 and she did not require adjuvant chemotherapy.  She was on tamoxifen for 5 years.  She had BRCA testing done at that time which was negative.   Patient felt a lump in her right axilla in September 2021.  She underwent ultrasound of bilateral breasts which did not pick up any axillary mass.  A prior screening mammogram in September 2021 was unremarkable.  She was then admitted for Covid pneumonia and underwent CT angio chest on 07/22/2020 which incidentally picked up an axillary mass measuring 2.5 cm.  No obvious breast mass.  Axillary mass was biopsied and was consistent with high-grade invasive mammary carcinoma.  IHC was positive for GATA3 with diffuse strong nuclear staining.  There was no lymph node architecture identified in the sample and it could not be determined if it is a primary tumor within the axillary breast tissue or metastases.  ER strongly positive greater than 90%, PR 1 to 10% positive and HER-2 negative   PET CT scan showed hypermetabolic poorly marginated solid right axillary mass 2.5 x 2 cm with an SUV of 5.6.  No enlarged hypermetabolic mediastinal or hilar lymph nodes no  enlarged left axillary lymph nodes.  Subcentimeter lung nodules below PET resolution.  Small hypermetabolism in the right liver with an SUV of 6 without CT correlate equivocal for liver metastases.  Multiple hypermetabolic faintly lytic osseous lesions throughout the lumbar spine and bilateral pelvic girdle with representative supra-acetabular right iliac bone 1.9 cm lesion, anterior superior left acetabular 1.7 cm lesion and L1 1.2 cm lesion.   NGS testing showed no actionable mutations.  PD-L1 less than 1%.  CHEK2 S422 FS, K3 CA and 1068 FS, PICC 3 CA and 345H, CCN E1 gain, ERB B2 1 P-CSF 1 hour fusion, FGF 19 gain, FGF 3 gain, FGF 4gain.   Ibrance plus letrozole started in February 2022.  Baseline tumor markers not elevated.      Interval history-she is doing well overall.  She works full-time and reports no significant side effects with Ibrance.  She is also taking her letrozole regularly.  ECOG PS- 1 Pain scale- 0   Review of systems- Review of Systems  Constitutional:  Positive for malaise/fatigue. Negative for chills, fever and weight loss.  HENT:  Negative for congestion, ear discharge and nosebleeds.   Eyes:  Negative for blurred vision.  Respiratory:  Negative for cough, hemoptysis, sputum production, shortness of breath and wheezing.   Cardiovascular:  Negative for chest pain, palpitations, orthopnea and claudication.  Gastrointestinal:  Negative for abdominal pain, blood in stool, constipation, diarrhea, heartburn, melena, nausea and vomiting.  Genitourinary:  Negative for dysuria, flank pain, frequency, hematuria and urgency.  Musculoskeletal:  Negative for back pain,  joint pain and myalgias.  Skin:  Negative for rash.  Neurological:  Negative for dizziness, tingling, focal weakness, seizures, weakness and headaches.  Endo/Heme/Allergies:  Does not bruise/bleed easily.  Psychiatric/Behavioral:  Negative for depression and suicidal ideas. The patient does not have insomnia.        Allergies  Allergen Reactions   Kiwi Extract Itching and Swelling    The real kiwi fruit   Codeine Other (See Comments)    Felt funny.   Food Itching and Swelling   Amoxicillin Rash   Erythromycin Rash   Sulfa Antibiotics Rash   Tape Rash    Adhesives  Pt can have paper tape     Past Medical History:  Diagnosis Date   Allergic genetic state    Arthritis    BRCA negative 2004   Breast cancer ([place])    2009 infiltrating ductal cancer of right breast   Breast mass 08/2006, 03/2009   left breast biopsy fibroadenoma (2008) right breast biopsy benign (2010)   Cancer of the skin, basal cell 03/2016   left lower leg   Central serous retinopathy    CHEK2-related breast cancer ([place]) 07/2020   Chicken pox    Depression    Fatty liver    Fatty liver    Gastric polyps    GERD (gastroesophageal reflux disease)    Gestational diabetes    prediabetes out side of having baby   Headache    migraines   Heart murmur    History of abnormal mammogram 08/2006, 01/2008, 03/2009   Hypertension    IBS (irritable bowel syndrome)    Joint disorder    hx of left ankle tendonitis, bursitis, heel spur   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 Smith/ laprascopic surgery   COLONOSCOPY  04/04/2000   COLONOSCOPY WITH PROPOFOL N/A 02/12/2019   Procedure: COLONOSCOPY  WITH PROPOFOL;  Surgeon: Lollie Sails, MD;  Location: [facility] ENDOSCOPY;  Service: Endoscopy;  Laterality: N/A;   ESOPHAGOGASTRODUODENOSCOPY (EGD) WITH PROPOFOL N/A 05/01/2015   Procedure: ESOPHAGOGASTRODUODENOSCOPY (EGD) WITH PROPOFOL;  Surgeon: Hulen Luster, MD;  Location: [facility] ENDOSCOPY;  Service: Gastroenterology;  Laterality: N/A;   ESOPHAGOGASTRODUODENOSCOPY (EGD) WITH PROPOFOL N/A 02/12/2019   Procedure: ESOPHAGOGASTRODUODENOSCOPY (EGD) WITH PROPOFOL;  Surgeon: Lollie Sails, MD;  Location: [facility] ENDOSCOPY;  Service: Endoscopy;  Laterality: N/A;   EYE SURGERY  08/2012   laser surgery on retina/ DR Appenzeller   FINGER SURGERY     repair of tendon in right index, Dr. Francesco Sor in [facility]   FOOT SURGERY     Dr. [place] CYST     right upper back   LAPAROSCOPIC SUPRACERVICAL HYSTERECTOMY  06/2007   Dr Kincius/ adhesions/CPP/adenomyosis  MASTECTOMY PARTIAL / LUMPECTOMY Right 01/2008   with sentinal lymph node biopsy/ infiltrating ductal cancer   REPAIR PERONEAL TENDONS ANKLE  10/2019   with tarsal exostectomy and sural neuroma excision on right    SKIN CANCER EXCISION     back and calves   WISDOM TOOTH EXTRACTION  1991    Social History   Socioeconomic History   Marital status: Married    Spouse name: Richardson Landry   Number of children: 1   Years of education: 14   Highest education level: Not on file  Occupational History   Occupation: CMA  Tobacco Use   Smoking status: Never   Smokeless tobacco: Never  Vaping Use   Vaping Use: Never used  Substance and Sexual Activity   Alcohol use: Yes    Alcohol/week: 0.0 standard drinks of alcohol    Comment: rare, 1-2 drinks per year   Drug use: No   Sexual activity: Yes    Partners: Male    Birth control/protection: Surgical    Comment: Hysterectomy   Other Topics Concern   Not on file  Social History Narrative   Lives in [place] with husband, no pets.  Works at [employer].      Diet -  regular   Exercise - occasional   Social Determinants of Health   Financial Resource Strain: Not on file  Food Insecurity: Not on file  Transportation Needs: Not on file  Physical Activity: Insufficiently Active (07/27/2017)   Exercise Vital Sign    Days of Exercise per Week: 4 days    Minutes of Exercise per Session: 30 min  Stress: No Stress Concern Present (07/27/2017)   [place]    Feeling of Stress : Only a little  Social Connections: Socially Integrated (07/27/2017)   Social Connection and Isolation Panel [NHANES]    Frequency of Communication with Friends and Family: More than three times a week    Frequency of Social Gatherings with Friends and Family: Once a week    Attends Religious Services: More than 4 times per year    Active Member of [employer] or Organizations: Yes    Attends Archivist Meetings: More than 4 times per year    Marital Status: Married  Human resources officer Violence: Not At Risk (07/27/2017)   Humiliation, Afraid, Rape, and Kick questionnaire    Fear of Current or Ex-Partner: No    Emotionally Abused: No    Physically Abused: No    Sexually Abused: No    Family History  Problem Relation Age of Onset   Hypertension Mother    Thyroid disease Mother    Breast cancer Mother 59   Colon cancer Mother 50       again at 45   Atrial fibrillation Mother    Hip fracture Mother    Skin cancer Mother    Stroke Father    Hypertension Father    Cancer Father 66       prostate   Parkinson's disease Father    Skin cancer Father    Hypertension Sister    Thyroid disease Sister    Cancer Sister        skin/ squamous cell   Diabetes Maternal Grandmother    Stroke Maternal Grandfather    Heart disease Paternal Grandfather    Diabetes Maternal Aunt    Cancer Maternal Aunt    Breast cancer Maternal Aunt 75   Lymphoma Maternal Uncle  Heart disease Maternal Uncle    Lung cancer Paternal  Aunt 80       smoker   Thyroid cancer Cousin        maternal   Breast cancer Cousin 55       maternal   Breast cancer Cousin 57   Colon cancer Cousin      Current Outpatient Medications:    b complex vitamins capsule, Take 1 capsule by mouth daily., Disp: , Rfl:    buPROPion (WELLBUTRIN XL) 300 MG 24 hr tablet, Take 1 tablet (300 mg total) by mouth once daily, Disp: 90 tablet, Rfl: 3   calcium carbonate (OSCAL) 1500 (600 Ca) MG TABS tablet, Take by mouth 2 (two) times daily with a meal., Disp: , Rfl:    Cholecalciferol (VITAMIN D3) 10 MCG (400 UNIT) CAPS, Take by mouth., Disp: , Rfl:    citalopram (CELEXA) 40 MG tablet, Take 1 tablet (40 mg total) by mouth daily., Disp: 90 tablet, Rfl: 1   clotrimazole-betamethasone (LOTRISONE) cream, APPLY EXTERNALLY TWICE A DAY FOR 2 WEEKS, Disp: 45 g, Rfl: 0   diltiazem (CARDIZEM CD) 120 MG 24 hr capsule, Take 1 capsule by mouth daily., Disp: 90 capsule, Rfl: 2   diphenhydrAMINE (BENADRYL) 25 MG tablet, Take 25 mg by mouth at bedtime as needed for sleep. Pt states daily now for sleep and allergies., Disp: , Rfl:    gabapentin (NEURONTIN) 100 MG capsule, Take 1 capsule (100 mg total) by mouth every morning AND 2 capsules (200 mg total) every evening., Disp: 270 capsule, Rfl: 1   ketotifen (ZADITOR) 0.025 % ophthalmic solution, Place 1 drop into both eyes daily., Disp: , Rfl:    letrozole (FEMARA) 2.5 MG tablet, Take 1 tablet (2.5 mg total) by mouth daily., Disp: 90 tablet, Rfl: 3   lisinopril (ZESTRIL) 10 MG tablet, Take 1 tablet (10 mg total) by mouth once daily, Disp: 90 tablet, Rfl: 1   metFORMIN (GLUCOPHAGE-XR) 500 MG 24 hr tablet, TAKE 1 TABLET BY MOUTH TWICE DAILY WITH A MEAL, Disp: 180 tablet, Rfl: 1   Multiple Vitamin (MULTIVITAMIN) tablet, Take 1 tablet by mouth daily., Disp: , Rfl:    omeprazole (PRILOSEC) 20 MG capsule, Take 1 capsule (20 mg total) by mouth daily., Disp: 90 capsule, Rfl: 0   ondansetron (ZOFRAN) 4 MG tablet, Take 1 tablet (4  mg total) by mouth every 8 (eight) hours as needed for nausea or vomiting., Disp: 90 tablet, Rfl: 0   oxyCODONE-acetaminophen (PERCOCET/ROXICET) 5-325 MG tablet, Take 1 tablet by mouth every 6 (six) hours as needed for severe pain., Disp: 30 tablet, Rfl: 0   palbociclib (IBRANCE) 75 MG tablet, Take 1 tablet (75 mg total) by mouth daily. Take for 21 days on, 7 days off, repeat every 28 days., Disp: 21 tablet, Rfl: 0   Zoster Vaccine Adjuvanted [facility]) injection, Inject into the muscle., Disp: 0.5 mL, Rfl: 1 No current facility-administered medications for this visit.  Facility-Administered Medications Ordered in Other Visits:    Zoledronic Acid (ZOMETA) IVPB 4 mg, 4 mg, Intravenous, Q28 days, Sindy Guadeloupe, MD, Stopped at 05/14/22 1526  Physical exam:  Vitals:   05/14/22 1359  BP: 120/61  Pulse: 86  Resp: 20  Temp: (!) 96.6 F (35.9 C)  SpO2: 100%  Weight: 267 lb (121.1 kg)   Physical Exam Constitutional:      General: She is not in acute distress. Cardiovascular:     Rate and Rhythm: Normal rate and regular rhythm.  Heart sounds: Normal heart sounds.  Pulmonary:     Effort: Pulmonary effort is normal.     Breath sounds: Normal breath sounds.  Abdominal:     General: Bowel sounds are normal.     Palpations: Abdomen is soft.  Skin:    General: Skin is warm and dry.  Neurological:     Mental Status: She is alert and oriented to person, place, and time.         Latest Ref Rng & Units 04/29/2022    9:55 AM  CMP  Creatinine 0.44 - 1.00 mg/dL 1.20       Latest Ref Rng & Units 01/26/2022   12:33 PM  CBC  WBC 4.0 - 10.5 K/uL 3.4   Hemoglobin 12.0 - 15.0 g/dL 11.7   Hematocrit 36.0 - 46.0 % 35.3   Platelets 150 - 400 K/uL 171     No images are attached to the encounter.  NM Bone Scan Whole Body  Result Date: 05/01/2022 CLINICAL DATA:  Metastatic breast cancer EXAM: NUCLEAR MEDICINE WHOLE BODY BONE SCAN TECHNIQUE: Whole body anterior and posterior images were  obtained approximately 3 hours after intravenous injection of radiopharmaceutical. RADIOPHARMACEUTICALS:  23.44 mCi Technetium-39mMDP IV COMPARISON:  Nuclear scintigraphic bone scan, 11/09/2021, CT chest abdomen pelvis, 04/29/2022 FINDINGS: Unchanged, subtle radiotracer avidity associated with sclerotic metastases of the T6 vertebral body, L1 vertebral body, and bilateral acetabula, similar to prior examination. Degenerative uptake associated with the shoulders, knees, and ankles. Expected soft tissue and urinary tract uptake and excretion. IMPRESSION: Unchanged, subtle radiotracer avidity associated with multiple sclerotic metastases of the vertebral bodies and bilateral acetabula. No new foci of radiotracer avid osseous metastatic disease. Electronically Signed   By: ADelanna AhmadiM.D.   On: 05/01/2022 20:16   CT CHEST ABDOMEN PELVIS W CONTRAST  Result Date: 04/30/2022 CLINICAL DATA:  Metastatic breast carcinoma. Surveillance. * Tracking Code: BO * EXAM: CT CHEST, ABDOMEN, AND PELVIS WITH CONTRAST TECHNIQUE: Multidetector CT imaging of the chest, abdomen and pelvis was performed following the standard protocol during bolus administration of intravenous contrast. RADIATION DOSE REDUCTION: This exam was performed according to the departmental dose-optimization program which includes automated exposure control, adjustment of the mA and/or kV according to patient size and/or use of iterative reconstruction technique. CONTRAST:  1074mOMNIPAQUE IOHEXOL 350 MG/ML SOLN COMPARISON:  11/09/2021 FINDINGS: CT CHEST FINDINGS Cardiovascular: No acute findings. Mediastinum/Lymph Nodes: No masses or pathologically enlarged lymph nodes identified. Lungs/Pleura: No suspicious pulmonary nodules or masses identified. No evidence of infiltrate or pleural effusion. Musculoskeletal: Sclerotic bone metastasis in the T6 vertebral body shows no significant change. CT ABDOMEN AND PELVIS FINDINGS Hepatobiliary: No masses identified.  Stable moderate diffuse hepatic steatosis. Prior cholecystectomy. No evidence of biliary obstruction. Pancreas:  No mass or inflammatory changes. Spleen:  Within normal limits in size and appearance. Adrenals/Urinary tract: Normal adrenal glands and right kidney. 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 No evidence of ureteral calculi or hydronephrosis. Stomach/Bowel: No evidence of obstruction, inflammatory process, or abnormal fluid collections. Mild sigmoid diverticulosis again noted, without evidence of diverticulitis. Vascular/Lymphatic: No pathologically enlarged lymph nodes identified. No acute vascular findings. Reproductive: Prior supracervical hysterectomy again noted. No mass or other significant abnormality identified. Other:  None. Musculoskeletal: Stable sclerotic bone metastasis in the L1 vertebral body and pelvis show no significant change. IMPRESSION: No evidence of soft tissue metastatic disease, within the chest, abdomen, or pelvis. Stable  sclerotic bone metastasis. Stable hepatic steatosis. Colonic diverticulosis, without radiographic evidence of diverticulitis. Electronically Signed   By: Marlaine Hind M.D.   On: 04/30/2022 13:04     Assessment and plan- Patient is a 61 y.o. female with history of metastatic ER positive breast cancer with bone metastases currently on letrozole and Ibrance here for routine follow-up  I have reviewed CT chest abdomen and pelvis images as well as bone scan in theCurrently and discussed findings with the patient which does not show any evidence of progressive disease.  Stable areas of bone metastases.  Plan is to continue letrozole along with Ibrance until progression or toxicity.  She has mild leukopenia likely secondary to [facility] but ANC remains more than 1.  Patient is receiving Zometa every 3 months for her bone metastases and will get her next dose today.  I will see her back in  3 months with labs   Visit Diagnosis 1. Malignant neoplasm of right female breast, unspecified estrogen receptor status, unspecified site of breast ([place])   2. High risk medication use   3. Encounter for monitoring zoledronic acid therapy      Dr. Randa Evens, MD, MPH [facility] [phone] 05/14/2022 4:06 PM

## 2022-05-15 ENCOUNTER — Other Ambulatory Visit (HOSPITAL_COMMUNITY): Payer: Self-pay

## 2022-05-15 LAB — CANCER ANTIGEN 27.29: CA 27.29: 26.9 U/mL (ref 0.0–38.6)

## 2022-05-17 ENCOUNTER — Other Ambulatory Visit (HOSPITAL_COMMUNITY): Payer: Self-pay

## 2022-05-17 MED ORDER — METFORMIN HCL ER 500 MG PO TB24
500.0000 mg | ORAL_TABLET | Freq: Two times a day (BID) | ORAL | 1 refills | Status: DC
  Filled 2022-06-14: qty 180, 90d supply, fill #0
  Filled 2022-09-20: qty 180, 90d supply, fill #1

## 2022-05-20 DIAGNOSIS — C7951 Secondary malignant neoplasm of bone: Secondary | ICD-10-CM | POA: Diagnosis not present

## 2022-05-20 DIAGNOSIS — E1159 Type 2 diabetes mellitus with other circulatory complications: Secondary | ICD-10-CM | POA: Diagnosis not present

## 2022-05-20 DIAGNOSIS — C50919 Malignant neoplasm of unspecified site of unspecified female breast: Secondary | ICD-10-CM | POA: Diagnosis not present

## 2022-05-20 DIAGNOSIS — I152 Hypertension secondary to endocrine disorders: Secondary | ICD-10-CM | POA: Diagnosis not present

## 2022-05-20 DIAGNOSIS — F325 Major depressive disorder, single episode, in full remission: Secondary | ICD-10-CM | POA: Diagnosis not present

## 2022-05-20 DIAGNOSIS — E1122 Type 2 diabetes mellitus with diabetic chronic kidney disease: Secondary | ICD-10-CM | POA: Diagnosis not present

## 2022-05-20 DIAGNOSIS — N1831 Chronic kidney disease, stage 3a: Secondary | ICD-10-CM | POA: Diagnosis not present

## 2022-05-20 DIAGNOSIS — I4892 Unspecified atrial flutter: Secondary | ICD-10-CM | POA: Diagnosis not present

## 2022-05-24 ENCOUNTER — Encounter: Payer: Self-pay | Admitting: Cardiology

## 2022-05-24 ENCOUNTER — Encounter: Payer: Self-pay | Admitting: Oncology

## 2022-05-25 ENCOUNTER — Other Ambulatory Visit (HOSPITAL_COMMUNITY): Payer: Self-pay

## 2022-06-14 ENCOUNTER — Other Ambulatory Visit (HOSPITAL_COMMUNITY): Payer: Self-pay

## 2022-06-16 ENCOUNTER — Other Ambulatory Visit: Payer: Self-pay | Admitting: Oncology

## 2022-06-16 ENCOUNTER — Other Ambulatory Visit (HOSPITAL_COMMUNITY): Payer: Self-pay

## 2022-06-16 DIAGNOSIS — C50919 Malignant neoplasm of unspecified site of unspecified female breast: Secondary | ICD-10-CM

## 2022-06-17 ENCOUNTER — Other Ambulatory Visit (HOSPITAL_COMMUNITY): Payer: Self-pay

## 2022-06-17 MED ORDER — PALBOCICLIB 75 MG PO TABS
75.0000 mg | ORAL_TABLET | Freq: Every day | ORAL | 0 refills | Status: DC
  Filled 2022-06-17 – 2022-07-06 (×3): qty 21, 28d supply, fill #0

## 2022-06-18 ENCOUNTER — Other Ambulatory Visit (HOSPITAL_COMMUNITY): Payer: Self-pay

## 2022-06-22 ENCOUNTER — Other Ambulatory Visit (HOSPITAL_COMMUNITY): Payer: Self-pay

## 2022-06-22 MED ORDER — LISINOPRIL 10 MG PO TABS
10.0000 mg | ORAL_TABLET | Freq: Every day | ORAL | 1 refills | Status: DC
  Filled 2022-06-24: qty 90, 90d supply, fill #0
  Filled 2022-09-20: qty 90, 90d supply, fill #1

## 2022-06-24 ENCOUNTER — Encounter: Payer: Self-pay | Admitting: Oncology

## 2022-06-24 ENCOUNTER — Other Ambulatory Visit: Payer: Self-pay

## 2022-06-24 ENCOUNTER — Other Ambulatory Visit (HOSPITAL_COMMUNITY): Payer: Self-pay

## 2022-06-25 ENCOUNTER — Other Ambulatory Visit (HOSPITAL_COMMUNITY): Payer: Self-pay

## 2022-06-25 ENCOUNTER — Other Ambulatory Visit: Payer: Self-pay

## 2022-06-26 ENCOUNTER — Encounter: Payer: Self-pay | Admitting: Oncology

## 2022-07-06 ENCOUNTER — Other Ambulatory Visit (HOSPITAL_COMMUNITY): Payer: Self-pay

## 2022-07-15 ENCOUNTER — Other Ambulatory Visit: Payer: Self-pay | Admitting: Gastroenterology

## 2022-07-15 ENCOUNTER — Encounter (HOSPITAL_COMMUNITY): Payer: Self-pay

## 2022-07-15 ENCOUNTER — Other Ambulatory Visit (HOSPITAL_COMMUNITY): Payer: Self-pay

## 2022-07-15 MED ORDER — OMEPRAZOLE 20 MG PO CPDR
20.0000 mg | DELAYED_RELEASE_CAPSULE | Freq: Every day | ORAL | 0 refills | Status: DC
  Filled 2022-07-15 – 2022-07-22 (×2): qty 90, 90d supply, fill #0

## 2022-07-22 ENCOUNTER — Other Ambulatory Visit: Payer: Self-pay

## 2022-07-22 ENCOUNTER — Ambulatory Visit (INDEPENDENT_AMBULATORY_CARE_PROVIDER_SITE_OTHER): Payer: 59

## 2022-07-22 ENCOUNTER — Other Ambulatory Visit: Payer: Self-pay | Admitting: Oncology

## 2022-07-22 ENCOUNTER — Other Ambulatory Visit (HOSPITAL_COMMUNITY): Payer: Self-pay

## 2022-07-22 ENCOUNTER — Encounter: Payer: Self-pay | Admitting: Oncology

## 2022-07-22 VITALS — BP 128/88 | HR 73 | Ht 63.0 in | Wt 274.2 lb

## 2022-07-22 DIAGNOSIS — C50919 Malignant neoplasm of unspecified site of unspecified female breast: Secondary | ICD-10-CM

## 2022-07-22 DIAGNOSIS — Z0130 Encounter for examination of blood pressure without abnormal findings: Secondary | ICD-10-CM | POA: Diagnosis not present

## 2022-07-22 DIAGNOSIS — R0602 Shortness of breath: Secondary | ICD-10-CM | POA: Diagnosis not present

## 2022-07-22 LAB — GLUCOSE, POCT (MANUAL RESULT ENTRY)
POC Glucose: 179 mg/dl — AB (ref 70–99)
POC Glucose: 187 mg/dl — AB (ref 70–99)
POC Glucose: 60 mg/dl — AB (ref 70–99)

## 2022-07-22 MED ORDER — PALBOCICLIB 75 MG PO TABS
75.0000 mg | ORAL_TABLET | Freq: Every day | ORAL | 0 refills | Status: DC
  Filled 2022-07-22 – 2022-07-26 (×2): qty 21, 28d supply, fill #0

## 2022-07-22 NOTE — Telephone Encounter (Signed)
CBC w/auto Differential (3 Part) Order: [id number]  Ref Range & Units 2 mo ago  WBC (White Blood Cell Count) 4.1 - 10.2 10^3/uL 3.8 Low   RBC (Red Blood Cell Count) 4.04 - 5.48 10^6/uL 3.97 Low   Hemoglobin 12.0 - 15.0 gm/dL 12.7  Hematocrit 35.0 - 47.0 % 38.4  MCV (Mean Corpuscular Volume) 80.0 - 100.0 fl 96.7  MCH (Mean Corpuscular Hemoglobin) 27.0 - 31.2 pg 32.0 High   MCHC (Mean Corpuscular Hemoglobin Concentration) 32.0 - 36.0 gm/dL 33.1  Platelet Count 150 - 450 10^3/uL 284  RDW-CV (Red Cell Distribution Width) 11.6 - 14.8 % 14.5  MPV (Mean Platelet Volume) 9.4 - 12.4 fl 9.8  Neutrophils 1.50 - 7.80 10^3/uL 2.90  Lymphocytes 1.00 - 3.60 10^3/uL 0.80 Low   Mixed Count 0.10 - 0.90 10^3/uL 0.10  Neutrophil % 32.0 - 70.0 % 76.2 High   Lymphocyte % 10.0 - 50.0 % 22.0  Mixed % 3.0 - 14.4 % 1.8 Low   Resulting Agency  [facility] - [facility]   Specimen Collected: 05/13/22 08:45   Performed by: [facility] - [facility] Last Resulted: 05/13/22 09:45  Received From: [facility]  Result Received: 05/14/22 10:21  Comprehensive Metabolic Panel (CMP) Order: [id number]  Ref Range & Units 2 mo ago  Glucose 70 - 110 mg/dL 112 High   Sodium 136 - 145 mmol/L 141  Potassium 3.6 - 5.1 mmol/L 5.0  Chloride 97 - 109 mmol/L 105  Carbon Dioxide (CO2) 22.0 - 32.0 mmol/L 26.1  Urea Nitrogen (BUN) 7 - 25 mg/dL 17  Creatinine 0.6 - 1.1 mg/dL 1.1  Glomerular Filtration Rate (eGFR) >60 mL/min/1.73sq m 58 Low   Comment: CKD-EPI (2021) does not include patient's race in the calculation of eGFR.  Monitoring changes of plasma creatinine and eGFR over time is useful for monitoring kidney function.  Interpretive Ranges for eGFR (CKD-EPI 2021):  eGFR:       >60 mL/min/1.73 sq. m - Normal eGFR:       30-59 mL/min/1.73 sq. m - Moderately Decreased eGFR:       15-29 mL/min/1.73 sq. m  - Severely Decreased eGFR:       < 15 mL/min/1.73 sq. m  - Kidney Failure   Note: These eGFR  calculations do not apply in acute situations when eGFR is changing rapidly or patients on dialysis.  Calcium 8.7 - 10.3 mg/dL 9.7  AST 8 - 39 U/L 19  ALT 5 - 38 U/L 27  Alk Phos (alkaline Phosphatase) 34 - 104 U/L 105 High   Albumin 3.5 - 4.8 g/dL 4.3  Bilirubin, Total 0.3 - 1.2 mg/dL 0.5  Protein, Total 6.1 - 7.9 g/dL 6.9  A/G Ratio 1.0 - 5.0 gm/dL 1.7  Resulting Agency  [facility] - [facility]   Specimen Collected: 05/13/22 08:45   Performed by: [place]: 05/13/22 17:27

## 2022-07-22 NOTE — Progress Notes (Signed)
Patient here today for a BP check and to check her blood sugar levels. She states she has been having a headache for the past 2 days. She feels like she's "in a fog and feeling off". Her second BP reading was 131/68. Her blood sugar levels were a little abnormal. The first one being 179, second one being 60 (3 hours after she ate), third one being 187 (2 hours after lunch). She has sent a mychart message to her PCP regarding these levels. She took excedrin and ibuprofen during lunch break and states her HA was starting to feel better.

## 2022-07-22 NOTE — Addendum Note (Signed)
Addended by: Brien Few on: 07/22/2022 03:19 PM   Modules accepted: Orders

## 2022-07-23 ENCOUNTER — Other Ambulatory Visit: Payer: Self-pay

## 2022-07-23 DIAGNOSIS — J0100 Acute maxillary sinusitis, unspecified: Secondary | ICD-10-CM | POA: Diagnosis not present

## 2022-07-23 MED ORDER — AZITHROMYCIN 250 MG PO TABS
ORAL_TABLET | ORAL | 0 refills | Status: AC
  Filled 2022-07-23: qty 6, 5d supply, fill #0

## 2022-07-23 MED ORDER — FLUTICASONE PROPIONATE 50 MCG/ACT NA SUSP
2.0000 | Freq: Every day | NASAL | 0 refills | Status: DC
  Filled 2022-07-23: qty 16, 30d supply, fill #0

## 2022-07-26 ENCOUNTER — Other Ambulatory Visit (HOSPITAL_COMMUNITY): Payer: Self-pay

## 2022-07-30 ENCOUNTER — Other Ambulatory Visit (HOSPITAL_COMMUNITY): Payer: Self-pay

## 2022-08-02 ENCOUNTER — Other Ambulatory Visit (HOSPITAL_COMMUNITY): Payer: Self-pay

## 2022-08-02 ENCOUNTER — Telehealth: Payer: Self-pay

## 2022-08-02 ENCOUNTER — Other Ambulatory Visit: Payer: Self-pay

## 2022-08-02 MED ORDER — BUPROPION HCL ER (XL) 300 MG PO TB24
300.0000 mg | ORAL_TABLET | Freq: Every day | ORAL | 3 refills | Status: DC
  Filled 2022-08-02: qty 90, 90d supply, fill #0
  Filled 2022-10-26: qty 90, 90d supply, fill #1
  Filled 2023-01-17: qty 90, 90d supply, fill #2
  Filled 2023-05-05: qty 90, 90d supply, fill #3

## 2022-08-02 NOTE — Telephone Encounter (Signed)
Oral Oncology Patient Advocate Encounter   Received notification that prior authorization for Jacqueline Deleon is required.   PA submitted on 08/02/22  Key [id number]  Status is pending     [name], [place] Patient [place]  [phone] (phone) [phone] (fax) 08/02/2022 8:05 AM

## 2022-08-03 ENCOUNTER — Other Ambulatory Visit (HOSPITAL_COMMUNITY): Payer: Self-pay

## 2022-08-03 ENCOUNTER — Other Ambulatory Visit: Payer: Self-pay

## 2022-08-03 NOTE — Telephone Encounter (Signed)
Oral Oncology Patient Advocate Encounter  Prior Authorization for Jacqueline Deleon has been approved.    PA# [id number]  Effective dates: 08/02/22 through 08/02/23.    [name], [facility] Patient [place]  [phone] (phone) [phone] (fax) 08/03/2022 8:15 AM

## 2022-08-16 ENCOUNTER — Other Ambulatory Visit: Payer: Self-pay

## 2022-08-16 ENCOUNTER — Ambulatory Visit: Payer: 59 | Admitting: Oncology

## 2022-08-16 ENCOUNTER — Ambulatory Visit: Payer: 59

## 2022-08-16 ENCOUNTER — Other Ambulatory Visit: Payer: 59

## 2022-08-16 MED ORDER — FLUTICASONE PROPIONATE 50 MCG/ACT NA SUSP
2.0000 | Freq: Every day | NASAL | 0 refills | Status: DC
  Filled 2022-08-16 – 2022-11-03 (×6): qty 16, 30d supply, fill #0

## 2022-08-18 ENCOUNTER — Other Ambulatory Visit: Payer: Self-pay | Admitting: Oncology

## 2022-08-18 ENCOUNTER — Other Ambulatory Visit (HOSPITAL_COMMUNITY): Payer: Self-pay

## 2022-08-18 ENCOUNTER — Other Ambulatory Visit: Payer: Self-pay

## 2022-08-18 DIAGNOSIS — C50919 Malignant neoplasm of unspecified site of unspecified female breast: Secondary | ICD-10-CM

## 2022-08-18 MED ORDER — PALBOCICLIB 75 MG PO TABS
75.0000 mg | ORAL_TABLET | Freq: Every day | ORAL | 0 refills | Status: DC
  Filled 2022-08-18: qty 21, 28d supply, fill #0

## 2022-08-18 NOTE — Telephone Encounter (Signed)
tains abnormal data CBC w/auto Differential (3 Part) Order: [id number]  Ref Range & Units 3 mo ago  WBC (White Blood Cell Count) 4.1 - 10.2 10^3/uL 3.8 Low   RBC (Red Blood Cell Count) 4.04 - 5.48 10^6/uL 3.97 Low   Hemoglobin 12.0 - 15.0 gm/dL 12.7  Hematocrit 35.0 - 47.0 % 38.4  MCV (Mean Corpuscular Volume) 80.0 - 100.0 fl 96.7  MCH (Mean Corpuscular Hemoglobin) 27.0 - 31.2 pg 32.0 High   MCHC (Mean Corpuscular Hemoglobin Concentration) 32.0 - 36.0 gm/dL 33.1  Platelet Count 150 - 450 10^3/uL 284  RDW-CV (Red Cell Distribution Width) 11.6 - 14.8 % 14.5  MPV (Mean Platelet Volume) 9.4 - 12.4 fl 9.8  Neutrophils 1.50 - 7.80 10^3/uL 2.90  Lymphocytes 1.00 - 3.60 10^3/uL 0.80 Low   Mixed Count 0.10 - 0.90 10^3/uL 0.10  Neutrophil % 32.0 - 70.0 % 76.2 High   Lymphocyte % 10.0 - 50.0 % 22.0  Mixed % 3.0 - 14.4 % 1.8 Low   Resulting Agency  [facility] - [facility]   Specimen Collected: 05/13/22 08:45   Performed by: [facility] Last Resulted: 05/13/22 09:45

## 2022-08-19 ENCOUNTER — Other Ambulatory Visit (HOSPITAL_COMMUNITY): Payer: Self-pay

## 2022-08-20 ENCOUNTER — Inpatient Hospital Stay (HOSPITAL_BASED_OUTPATIENT_CLINIC_OR_DEPARTMENT_OTHER): Payer: 59 | Admitting: Oncology

## 2022-08-20 ENCOUNTER — Inpatient Hospital Stay: Payer: 59

## 2022-08-20 ENCOUNTER — Encounter: Payer: Self-pay | Admitting: Oncology

## 2022-08-20 ENCOUNTER — Inpatient Hospital Stay: Payer: 59 | Attending: Oncology

## 2022-08-20 VITALS — BP 138/72 | HR 78 | Temp 98.6°F | Resp 18 | Ht 63.0 in | Wt 273.0 lb

## 2022-08-20 DIAGNOSIS — Z170 Estrogen receptor positive status [ER+]: Secondary | ICD-10-CM | POA: Insufficient documentation

## 2022-08-20 DIAGNOSIS — Z7983 Long term (current) use of bisphosphonates: Secondary | ICD-10-CM | POA: Diagnosis not present

## 2022-08-20 DIAGNOSIS — C50919 Malignant neoplasm of unspecified site of unspecified female breast: Secondary | ICD-10-CM | POA: Diagnosis not present

## 2022-08-20 DIAGNOSIS — Z79899 Other long term (current) drug therapy: Secondary | ICD-10-CM

## 2022-08-20 DIAGNOSIS — C50611 Malignant neoplasm of axillary tail of right female breast: Secondary | ICD-10-CM | POA: Insufficient documentation

## 2022-08-20 DIAGNOSIS — Z5181 Encounter for therapeutic drug level monitoring: Secondary | ICD-10-CM | POA: Diagnosis not present

## 2022-08-20 DIAGNOSIS — Z79811 Long term (current) use of aromatase inhibitors: Secondary | ICD-10-CM | POA: Diagnosis not present

## 2022-08-20 DIAGNOSIS — C7951 Secondary malignant neoplasm of bone: Secondary | ICD-10-CM | POA: Diagnosis not present

## 2022-08-20 DIAGNOSIS — C50911 Malignant neoplasm of unspecified site of right female breast: Secondary | ICD-10-CM

## 2022-08-20 LAB — CBC WITH DIFFERENTIAL/PLATELET
Abs Immature Granulocytes: 0 10*3/uL (ref 0.00–0.07)
Band Neutrophils: 0 %
Basophils Absolute: 0 10*3/uL (ref 0.0–0.1)
Basophils Relative: 0 %
Blasts: 0 %
Eosinophils Absolute: 0.2 10*3/uL (ref 0.0–0.5)
Eosinophils Relative: 5 %
HCT: 40 % (ref 36.0–46.0)
Hemoglobin: 12.9 g/dL (ref 12.0–15.0)
Lymphocytes Relative: 22 %
Lymphs Abs: 1 10*3/uL (ref 0.7–4.0)
MCH: 30.3 pg (ref 26.0–34.0)
MCHC: 32.3 g/dL (ref 30.0–36.0)
MCV: 93.9 fL (ref 80.0–100.0)
Metamyelocytes Relative: 0 %
Monocytes Absolute: 0.3 10*3/uL (ref 0.1–1.0)
Monocytes Relative: 7 %
Myelocytes: 0 %
Neutro Abs: 3.1 10*3/uL (ref 1.7–7.7)
Neutrophils Relative %: 66 %
Other: 0 %
Platelets: 296 10*3/uL (ref 150–400)
Promyelocytes Relative: 0 %
RBC: 4.26 MIL/uL (ref 3.87–5.11)
RDW: 17.4 % — ABNORMAL HIGH (ref 11.5–15.5)
Smear Review: ADEQUATE
WBC: 4.6 10*3/uL (ref 4.0–10.5)
nRBC: 0 % (ref 0.0–0.2)
nRBC: 0 /100 WBC

## 2022-08-20 LAB — COMPREHENSIVE METABOLIC PANEL
ALT: 25 U/L (ref 0–44)
AST: 26 U/L (ref 15–41)
Albumin: 4.1 g/dL (ref 3.5–5.0)
Alkaline Phosphatase: 109 U/L (ref 38–126)
Anion gap: 12 (ref 5–15)
BUN: 20 mg/dL (ref 6–20)
CO2: 23 mmol/L (ref 22–32)
Calcium: 9.1 mg/dL (ref 8.9–10.3)
Chloride: 101 mmol/L (ref 98–111)
Creatinine, Ser: 1.16 mg/dL — ABNORMAL HIGH (ref 0.44–1.00)
GFR, Estimated: 54 mL/min — ABNORMAL LOW (ref >60)
Glucose, Bld: 110 mg/dL — ABNORMAL HIGH (ref 70–99)
Potassium: 3.9 mmol/L (ref 3.5–5.1)
Sodium: 136 mmol/L (ref 135–145)
Total Bilirubin: 0.6 mg/dL (ref 0.3–1.2)
Total Protein: 7.9 g/dL (ref 6.5–8.1)

## 2022-08-20 MED ORDER — SODIUM CHLORIDE 0.9 % IV SOLN
INTRAVENOUS | Status: DC
  Filled 2022-08-20: qty 250

## 2022-08-20 MED ORDER — ZOLEDRONIC ACID 4 MG/100ML IV SOLN
4.0000 mg | INTRAVENOUS | Status: DC
  Administered 2022-08-20: 4 mg via INTRAVENOUS
  Filled 2022-08-20: qty 100

## 2022-08-22 ENCOUNTER — Encounter: Payer: Self-pay | Admitting: Oncology

## 2022-08-22 NOTE — Progress Notes (Signed)
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  It was a 1.4 cm tumor with negative lymph nodes grade 1 and patient went on to get lumpectomy followed by adjuvant radiation therapy.  Oncotype DX score was 6 and she did not require adjuvant chemotherapy.  She was on tamoxifen for 5 years.  She had BRCA testing done at that time which was negative.   Patient felt a lump in her right axilla in September 2021.  She underwent ultrasound of bilateral breasts which did not pick up any axillary mass.  A prior screening mammogram in September 2021 was unremarkable.  She was then admitted for Covid pneumonia and underwent CT angio chest on 07/22/2020 which incidentally picked up an axillary mass measuring 2.5 cm.  No obvious breast mass.  Axillary mass was biopsied and was consistent with high-grade invasive mammary carcinoma.  IHC was positive for GATA3 with diffuse strong nuclear staining.  There was no lymph node architecture identified in the sample and it could not be determined if it is a primary tumor within the axillary breast tissue or metastases.  ER strongly positive greater than 90%, PR 1 to 10% positive and HER-2 negative   PET CT scan showed hypermetabolic poorly marginated solid right axillary mass 2.5 x 2 cm with an SUV of 5.6.  No enlarged hypermetabolic mediastinal or hilar lymph nodes no enlarged left axillary lymph nodes.   Subcentimeter lung nodules below PET resolution.  Small hypermetabolism in the right liver with an SUV of 6 without CT correlate equivocal for liver metastases.  Multiple hypermetabolic faintly lytic osseous lesions throughout the lumbar spine and bilateral pelvic girdle with representative supra-acetabular right iliac bone 1.9 cm lesion, anterior superior left acetabular 1.7 cm lesion and L1 1.2 cm lesion.   NGS testing showed no actionable mutations.  PD-L1 less than 1%.  CHEK2 S422 FS, K3 CA and 1068 FS, PICC 3 CA and 345H, CCN E1 gain, ERB B2 1 P-CSF 1 hour fusion, FGF 19 gain, FGF 3 gain, FGF 4gain.   Ibrance plus letrozole started in February 2022.  Baseline tumor markers not elevated.    Interval history- she has chronic fatigue. Hip pain is stable. No new complaints today. Tolerating ibrance otherwise relatively well with letrozole  ECOG PS- 1 Pain scale- 2   Review of systems- Review of Systems  Constitutional:  Positive for malaise/fatigue. Negative for chills, fever and weight loss.  HENT:  Negative for congestion, ear discharge and nosebleeds.   Eyes:  Negative for blurred vision.  Respiratory:  Negative for cough, hemoptysis, sputum production, shortness of breath and wheezing.   Cardiovascular:  Negative for chest pain, palpitations, orthopnea and claudication.  Gastrointestinal:  Negative for abdominal pain, blood in stool, constipation, diarrhea, heartburn, melena, nausea and vomiting.  Genitourinary:  Negative for dysuria, flank pain, frequency, hematuria and urgency.  Musculoskeletal:  Negative for back pain, joint pain and myalgias.  Skin:  Negative for rash.  Neurological:  Negative for dizziness, tingling, focal weakness, seizures, weakness and headaches.  Endo/Heme/Allergies:  Does not bruise/bleed easily.  Psychiatric/Behavioral:  Negative for depression and suicidal ideas. The patient does not have insomnia.       Allergies  Allergen Reactions   Kiwi Extract  Itching and Swelling    The real kiwi fruit   Codeine Other (See Comments)    Felt funny.   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   Chicken pox    Depression    Fatty liver    Fatty liver    Gastric polyps    GERD (gastroesophageal reflux disease)    Gestational diabetes    prediabetes out side of having baby   Headache    migraines   Heart murmur    History of abnormal mammogram 08/2006, 01/2008, 03/2009   Hypertension    IBS (irritable bowel syndrome)    Joint disorder    hx of left ankle tendonitis, bursitis, heel spur   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 Smith/ laprascopic surgery   COLONOSCOPY  04/04/2000   COLONOSCOPY WITH PROPOFOL N/A 02/12/2019   Procedure: COLONOSCOPY WITH PROPOFOL;  Surgeon: Lollie Sails, MD;   Location[facility];  Service: Endoscopy;  Laterality: N/A;   ESOPHAGOGASTRODUODENOSCOPY (EGD) WITH PROPOFOL N/A 05/01/2015   Procedure: ESOPHAGOGASTRODUODENOSCOPY (EGD) WITH PROPOFOL;  Surgeon: Hulen Luster, MD;  Location: [facility] ENDOSCOPY;  Service: Gastroenterology;  Laterality: N/A;   ESOPHAGOGASTRODUODENOSCOPY (EGD) WITH PROPOFOL N/A 02/12/2019   Procedure: ESOPHAGOGASTRODUODENOSCOPY (EGD) WITH PROPOFOL;  Surgeon: Lollie Sails, MD;  Location: [facility] ENDOSCOPY;  Service: Endoscopy;  Laterality: N/A;   EYE SURGERY  08/2012   laser surgery on retina/ DR Appenzeller   FINGER SURGERY     repair of tendon in right index, Dr. Francesco Sor in [facility]   FOOT SURGERY     Dr. [place] CYST     right upper back   LAPAROSCOPIC SUPRACERVICAL HYSTERECTOMY  06/2007   Dr Kincius/ adhesions/CPP/adenomyosis   MASTECTOMY PARTIAL / LUMPECTOMY Right 01/2008   with sentinal  lymph node biopsy/ infiltrating ductal cancer   REPAIR PERONEAL TENDONS ANKLE  10/2019   with tarsal exostectomy and sural neuroma excision on right    SKIN CANCER EXCISION     back and calves   WISDOM TOOTH EXTRACTION  1991    Social History   Socioeconomic History   Marital status: Married    Spouse name: Richardson Landry   Number of children: 1   Years of education: 14   Highest education level: Not on file  Occupational History   Occupation: CMA  Tobacco Use   Smoking status: Never   Smokeless tobacco: Never  Vaping Use   Vaping Use: Never used  Substance and Sexual Activity   Alcohol use: Yes    Alcohol/week: 0.0 standard drinks of alcohol    Comment: rare, 1-2 drinks per year   Drug use: No   Sexual activity: Yes    Partners: Male    Birth control/protection: Surgical    Comment: Hysterectomy   Other Topics Concern   Not on file  Social History Narrative   Lives in [place] with husband, no pets.  Works at [employer].      Diet - regular   Exercise - occasional   Social  Determinants of Health   Financial Resource Strain: Not on file  Food Insecurity: Not on file  Transportation Needs: Not on file  Physical Activity: Insufficiently Active (07/27/2017)   Exercise Vital Sign    Days of Exercise per Week: 4 days    Minutes of Exercise per Session: 30 min  Stress: No Stress Concern Present (07/27/2017)   [place]    Feeling of Stress : Only a little  Social Connections: Socially Integrated (07/27/2017)   Social Connection and Isolation Panel [NHANES]    Frequency of Communication with Friends and Family: More than three times a week    Frequency of Social Gatherings with Friends and Family: Once a week    Attends Religious Services: More than 4 times per year    Active Member of [employer] or Organizations: Yes    Attends Archivist Meetings: More than 4 times per year    Marital Status: Married  Human resources officer Violence: Not At Risk (07/27/2017)   Humiliation, Afraid, Rape, and Kick questionnaire    Fear of Current or Ex-Partner: No    Emotionally Abused: No    Physically Abused: No    Sexually Abused: No    Family History  Problem Relation Age of Onset   Hypertension Mother    Thyroid disease Mother    Breast cancer Mother 64   Colon cancer Mother 20       again at 36   Atrial fibrillation Mother    Hip fracture Mother    Skin cancer Mother    Stroke Father    Hypertension Father    Cancer Father 73       prostate   Parkinson's disease Father    Skin cancer Father    Hypertension Sister    Thyroid disease Sister    Cancer Sister        skin/ squamous cell   Diabetes Maternal Grandmother    Stroke Maternal Grandfather    Heart disease Paternal Grandfather    Diabetes Maternal Aunt    Cancer Maternal Aunt    Breast cancer Maternal Aunt 75   Lymphoma Maternal Uncle    Heart disease Maternal Uncle    Lung cancer Paternal  Aunt 80       smoker   Thyroid cancer  Cousin        maternal   Breast cancer Cousin 23       maternal   Breast cancer Cousin 64   Colon cancer Cousin      Current Outpatient Medications:    aspirin EC 81 MG tablet, Take by mouth., Disp: , Rfl:    b complex vitamins capsule, Take 1 capsule by mouth daily., Disp: , Rfl:    buPROPion (WELLBUTRIN XL) 300 MG 24 hr tablet, Take 1 tablet (300 mg total) by mouth daily., Disp: 90 tablet, Rfl: 3   calcium carbonate (OSCAL) 1500 (600 Ca) MG TABS tablet, Take by mouth 2 (two) times daily with a meal., Disp: , Rfl:    Cholecalciferol (VITAMIN D3) 10 MCG (400 UNIT) CAPS, Take by mouth., Disp: , Rfl:    citalopram (CELEXA) 40 MG tablet, Take 1 tablet (40 mg total) by mouth daily., Disp: 90 tablet, Rfl: 1   diltiazem (CARDIZEM CD) 120 MG 24 hr capsule, Take 1 capsule by mouth daily., Disp: 90 capsule, Rfl: 2   diphenhydrAMINE (BENADRYL) 25 MG tablet, Take 25 mg by mouth at bedtime as needed for sleep. Pt states daily now for sleep and allergies., Disp: , Rfl:    fluticasone (FLONASE) 50 MCG/ACT nasal spray, Place 2 sprays into both nostrils daily., Disp: 16 g, Rfl: 0   gabapentin (NEURONTIN) 100 MG capsule, Take 1 capsule (100 mg total) by mouth every morning AND 2 capsules (200 mg total) every evening., Disp: 270 capsule, Rfl: 1   ketotifen (ZADITOR) 0.025 % ophthalmic solution, Place 1 drop into both eyes daily., Disp: , Rfl:    letrozole (FEMARA) 2.5 MG tablet, Take 1 tablet (2.5 mg total) by mouth daily., Disp: 90 tablet, Rfl: 3   lisinopril (ZESTRIL) 10 MG tablet, Take 1 tablet (10 mg total) by mouth daily., Disp: 90 tablet, Rfl: 1   metFORMIN (GLUCOPHAGE-XR) 500 MG 24 hr tablet, Take 1 tablet (500 mg total) by mouth 2 (two) times daily with a meal., Disp: 180 tablet, Rfl: 1   Multiple Vitamin (MULTIVITAMIN) tablet, Take 1 tablet by mouth daily., Disp: , Rfl:    omeprazole (PRILOSEC) 20 MG capsule, Take 1 capsule (20 mg total) by mouth daily., Disp: 90 capsule, Rfl: 0   ondansetron  (ZOFRAN) 4 MG tablet, Take 1 tablet (4 mg total) by mouth every 8 (eight) hours as needed for nausea or vomiting., Disp: 90 tablet, Rfl: 0   oxyCODONE-acetaminophen (PERCOCET/ROXICET) 5-325 MG tablet, Take 1 tablet by mouth every 6 (six) hours as needed for severe pain., Disp: 30 tablet, Rfl: 0   palbociclib (IBRANCE) 75 MG tablet, Take 1 tablet (75 mg total) by mouth daily. Take for 21 days on, 7 days off, repeat every 28 days., Disp: 21 tablet, Rfl: 0   Zoster Vaccine Adjuvanted [facility]) injection, Inject into the muscle., Disp: 0.5 mL, Rfl: 1  Physical exam:  Vitals:   08/20/22 1347  BP: 138/72  Pulse: 78  Resp: 18  Temp: 98.6 F (37 C)  TempSrc: Tympanic  SpO2: 99%  Weight: 273 lb (123.8 kg)  Height: 5' 3"$  (1.6 m)   Physical Exam Cardiovascular:     Rate and Rhythm: Normal rate and regular rhythm.     Heart sounds: Normal heart sounds.  Pulmonary:     Effort: Pulmonary effort is normal.     Breath sounds: Normal breath sounds.  Abdominal:  General: Bowel sounds are normal.     Palpations: Abdomen is soft.  Skin:    General: Skin is warm and dry.  Neurological:     Mental Status: She is alert and oriented to person, place, and time.         Latest Ref Rng & Units 08/20/2022    1:28 PM  CMP  Glucose 70 - 99 mg/dL 110   BUN 6 - 20 mg/dL 20   Creatinine 0.44 - 1.00 mg/dL 1.16   Sodium 135 - 145 mmol/L 136   Potassium 3.5 - 5.1 mmol/L 3.9   Chloride 98 - 111 mmol/L 101   CO2 22 - 32 mmol/L 23   Calcium 8.9 - 10.3 mg/dL 9.1   Total Protein 6.5 - 8.1 g/dL 7.9   Total Bilirubin 0.3 - 1.2 mg/dL 0.6   Alkaline Phos 38 - 126 U/L 109   AST 15 - 41 U/L 26   ALT 0 - 44 U/L 25       Latest Ref Rng & Units 08/20/2022    1:28 PM  CBC  WBC 4.0 - 10.5 K/uL 4.6   Hemoglobin 12.0 - 15.0 g/dL 12.9   Hematocrit 36.0 - 46.0 % 40.0   Platelets 150 - 400 K/uL 296      Assessment and plan- Patient is a 62 y.o. female  with history of metastatic ER positive breast cancer with  bone metastases currently on letrozole and Ibrance. This is a routine f/u visit    Patient is presently on Ibrance at the lowest dose of 75 mg along with  It resolved which she is tolerating it well without any significant side effects.  Past  See  ET and bone scan in October 2023 showed stable areas of bone mets.  Her baseline Bone density scan is also normal.  She receives Zometa every  3 months  And will be due for her next dose today.  I will  And to get repeat scans in April 2024 and see her thereafter.  Mild leukopenia secondary to Ibrance.  Continue to monitor   Visit Diagnosis 1. Primary malignant neoplasm of breast with metastasis ([place])   2. High risk medication use   3. Use of letrozole ([place])   4. Encounter for monitoring zoledronic acid therapy      Dr. Randa Evens, MD, MPH [facility] at [facility] [id number] 08/22/2022 1:10 PM

## 2022-08-23 ENCOUNTER — Encounter: Payer: Self-pay | Admitting: *Deleted

## 2022-08-23 ENCOUNTER — Other Ambulatory Visit (HOSPITAL_COMMUNITY): Payer: Self-pay

## 2022-08-26 ENCOUNTER — Other Ambulatory Visit (HOSPITAL_COMMUNITY): Payer: Self-pay

## 2022-08-30 ENCOUNTER — Telehealth: Payer: Self-pay | Admitting: *Deleted

## 2022-08-30 ENCOUNTER — Other Ambulatory Visit: Payer: Self-pay | Admitting: *Deleted

## 2022-08-30 ENCOUNTER — Other Ambulatory Visit: Payer: Self-pay

## 2022-08-30 ENCOUNTER — Encounter: Payer: Self-pay | Admitting: *Deleted

## 2022-08-30 DIAGNOSIS — R42 Dizziness and giddiness: Secondary | ICD-10-CM

## 2022-08-30 DIAGNOSIS — R519 Headache, unspecified: Secondary | ICD-10-CM

## 2022-08-30 DIAGNOSIS — C50919 Malignant neoplasm of unspecified site of unspecified female breast: Secondary | ICD-10-CM

## 2022-08-30 NOTE — Telephone Encounter (Signed)
Patient called reporting that she is having severe fatigue, this is not new as she has had it since first diagnosed. She was unable to go to work today because showering this morning "wiped me out". She also reports that her headaches are different now 2 weeks ago she started having sensitivity to light and sound and her scalp gets sore. She reports that she has a headache every day for the past week. She does have vision changes in that her vision was blurry with her headache, denies fever. Had dizziness Saturday with headache, but better now. Denise feeling like she is going to pass out. Asking about whether or not she can go to chiropractor she feels this would help with her headaches.

## 2022-08-30 NOTE — Telephone Encounter (Signed)
Jacqueline Deleon- we can get mri brain with and without contrast. If that's negative- we can give her prn fioricet. If headaches persist despite that we will refer her to neurology which would be preferable to chiropractor

## 2022-08-31 ENCOUNTER — Other Ambulatory Visit: Payer: Self-pay

## 2022-09-01 ENCOUNTER — Ambulatory Visit
Admission: RE | Admit: 2022-09-01 | Discharge: 2022-09-01 | Disposition: A | Discharge status: Home / Self Care | Payer: 59 | Source: Ambulatory Visit | Attending: Oncology | Admitting: Oncology

## 2022-09-01 DIAGNOSIS — C50919 Malignant neoplasm of unspecified site of unspecified female breast: Secondary | ICD-10-CM | POA: Diagnosis not present

## 2022-09-01 DIAGNOSIS — R519 Headache, unspecified: Secondary | ICD-10-CM | POA: Diagnosis not present

## 2022-09-01 DIAGNOSIS — R42 Dizziness and giddiness: Secondary | ICD-10-CM | POA: Diagnosis not present

## 2022-09-01 MED ORDER — GADOBUTROL 1 MMOL/ML IV SOLN
10.0000 mL | Freq: Once | INTRAVENOUS | Status: AC | PRN
  Administered 2022-09-01: 10 mL via INTRAVENOUS

## 2022-09-02 ENCOUNTER — Other Ambulatory Visit: Payer: Self-pay | Admitting: *Deleted

## 2022-09-02 MED ORDER — BUTALBITAL-APAP-CAFFEINE 50-325-40 MG PO TABS: 1.0000 | ORAL_TABLET | Freq: Four times a day (QID) | ORAL | 0 refills | Status: DC | PRN

## 2022-09-02 NOTE — Telephone Encounter (Signed)
Wanted her to know that dr Janese Banks says there was nothing new on brain scan. She said if she his still having HA then give her the fioricet and she says today is  tolerable but would like to have the med if it gets worse , I put in order and dr Janese Banks will sign it. Pt agreeable to this

## 2022-09-13 ENCOUNTER — Encounter: Payer: Self-pay | Admitting: Oncology

## 2022-09-13 DIAGNOSIS — R051 Acute cough: Secondary | ICD-10-CM | POA: Diagnosis not present

## 2022-09-13 DIAGNOSIS — R52 Pain, unspecified: Secondary | ICD-10-CM | POA: Diagnosis not present

## 2022-09-13 DIAGNOSIS — Z6841 Body Mass Index (BMI) 40.0 and over, adult: Secondary | ICD-10-CM | POA: Diagnosis not present

## 2022-09-13 DIAGNOSIS — J069 Acute upper respiratory infection, unspecified: Secondary | ICD-10-CM | POA: Diagnosis not present

## 2022-09-14 ENCOUNTER — Other Ambulatory Visit: Payer: Self-pay

## 2022-09-14 DIAGNOSIS — R0981 Nasal congestion: Secondary | ICD-10-CM | POA: Diagnosis not present

## 2022-09-14 DIAGNOSIS — J029 Acute pharyngitis, unspecified: Secondary | ICD-10-CM | POA: Diagnosis not present

## 2022-09-14 DIAGNOSIS — R52 Pain, unspecified: Secondary | ICD-10-CM | POA: Diagnosis not present

## 2022-09-14 DIAGNOSIS — J069 Acute upper respiratory infection, unspecified: Secondary | ICD-10-CM | POA: Diagnosis not present

## 2022-09-15 ENCOUNTER — Other Ambulatory Visit: Payer: Self-pay

## 2022-09-17 ENCOUNTER — Emergency Department: Payer: 59

## 2022-09-17 ENCOUNTER — Other Ambulatory Visit: Payer: Self-pay

## 2022-09-17 ENCOUNTER — Encounter: Payer: Self-pay | Admitting: Intensive Care

## 2022-09-17 ENCOUNTER — Emergency Department
Admission: EM | Admit: 2022-09-17 | Discharge: 2022-09-17 | Disposition: A | Discharge status: Home / Self Care | Payer: 59 | Attending: Emergency Medicine | Admitting: Emergency Medicine

## 2022-09-17 DIAGNOSIS — I10 Essential (primary) hypertension: Secondary | ICD-10-CM | POA: Insufficient documentation

## 2022-09-17 DIAGNOSIS — E119 Type 2 diabetes mellitus without complications: Secondary | ICD-10-CM | POA: Insufficient documentation

## 2022-09-17 DIAGNOSIS — Z8616 Personal history of COVID-19: Secondary | ICD-10-CM | POA: Diagnosis not present

## 2022-09-17 DIAGNOSIS — Z853 Personal history of malignant neoplasm of breast: Secondary | ICD-10-CM | POA: Diagnosis not present

## 2022-09-17 DIAGNOSIS — Z85828 Personal history of other malignant neoplasm of skin: Secondary | ICD-10-CM | POA: Diagnosis not present

## 2022-09-17 DIAGNOSIS — S0990XA Unspecified injury of head, initial encounter: Secondary | ICD-10-CM | POA: Insufficient documentation

## 2022-09-17 DIAGNOSIS — W182XXA Fall in (into) shower or empty bathtub, initial encounter: Secondary | ICD-10-CM | POA: Diagnosis not present

## 2022-09-17 DIAGNOSIS — Z043 Encounter for examination and observation following other accident: Secondary | ICD-10-CM | POA: Diagnosis not present

## 2022-09-17 DIAGNOSIS — Y92002 Bathroom of unspecified non-institutional (private) residence single-family (private) house as the place of occurrence of the external cause: Secondary | ICD-10-CM | POA: Insufficient documentation

## 2022-09-17 MED ORDER — ACETAMINOPHEN 325 MG PO TABS
650.0000 mg | ORAL_TABLET | Freq: Once | ORAL | Status: AC
  Administered 2022-09-17: 650 mg via ORAL
  Filled 2022-09-17: qty 2

## 2022-09-17 NOTE — ED Triage Notes (Signed)
Reports falling this AM and hitting head on tile wall. Denies LOC. Denies vision changes.

## 2022-09-17 NOTE — Discharge Instructions (Addendum)
Please return for any new, worsening or change in symptoms or other concerns.

## 2022-09-17 NOTE — ED Provider Notes (Signed)
[facility] Provider Note    Event Date/Time   First MD Initiated Contact with Patient 09/17/22 [phone]     (approximate)   History   Fall   HPI  Jacqueline Deleon is a 62 y.o. 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  Patient reports that she was getting down into the tub today when she lost her balance and hit her head on the tiled side of the bathtub.  She denies loss of consciousness.  She denies any preceding symptoms, and is quite sure that she had a slip and fall.  She was able to get herself up without assistance.  She has not had any trouble walking.  No visual changes.  No nausea or vomiting.  No neck pain.  No paresthesias or weakness.  Patient Active Problem List   Diagnosis Date Noted   Carcinoma of breast metastatic to bone ([place]) 11/11/2020   Primary malignant neoplasm of breast with metastasis ([place]) 08/23/2020   Goals of care, counseling/discussion 08/23/2020   Atrial flutter with rapid ventricular response ([place]) 08/07/2020   COVID-19 virus infection 08/07/2020   Depression 08/07/2020   OSA on CPAP [id number]   Non-alcoholic fatty liver disease 03/06/2017   Rosacea    GERD (gastroesophageal reflux disease)    Central serous retinopathy    IBS (irritable bowel syndrome)    Dyspnea on effort 12/22/2016   Anxiety, generalized 04/29/2016   Diabetes mellitus without complication ([place]) [id number]   Cancer of the skin, basal cell 03/12/2016   Contact dermatitis 04/23/2013   Nodule, subcutaneous 07/24/2012   Localized swelling, mass and lump, unspecified 07/24/2012   Hypertension 12/20/2011   Personal history of breast cancer 12/20/2011   Obesity 12/20/2011          Physical Exam   Triage Vital Signs: ED Triage Vitals  Enc Vitals Group     BP 09/17/22 0816 104/83     Pulse Rate 09/17/22 0816 92     Resp 09/17/22 0816 16     Temp  09/17/22 0816 98.9 F (37.2 C)     Temp Source 09/17/22 0816 Oral     SpO2 09/17/22 0816 97 %     Weight 09/17/22 0817 272 lb (123.4 kg)     Height 09/17/22 0817 '5\' 3"'$  (1.6 m)     Head Circumference --      Peak Flow --      Pain Score 09/17/22 0817 7     Pain Loc --      Pain Edu? --      Excl. in [place]? --     Most recent vital signs: Vitals:   09/17/22 0816  BP: 104/83  Pulse: 92  Resp: 16  Temp: 98.9 F (37.2 C)  SpO2: 97%    Physical Exam Vitals and nursing note reviewed.  Constitutional:      General: Awake and alert. No acute distress.    Appearance: Normal appearance. The patient is obese HENT:     Head: Normocephalic and atraumatic.  No hematoma.  No scalp laceration.  No Battle sign or raccoon eyes    Mouth: Mucous membranes are moist.  Eyes:     General: PERRL. Normal EOMs        Right eye: No discharge.        Left eye: No discharge.     Conjunctiva/sclera: Conjunctivae normal.  Cardiovascular:  Rate and Rhythm: Normal rate and regular rhythm.     Pulses: Normal pulses.  Pulmonary:     Effort: Pulmonary effort is normal. No respiratory distress.     Breath sounds: Normal breath sounds.  Abdominal:     Abdomen is soft. There is no abdominal tenderness. No rebound or guarding. No distention. Musculoskeletal:        General: No swelling. Normal range of motion.     Cervical back: Normal range of motion and neck supple.  No midline cervical spine tenderness.  Full range of motion of neck.  Negative Spurling test.  Negative Lhermitte sign.  Normal strength and sensation in bilateral upper extremities. Normal grip strength bilaterally.  Normal intrinsic muscle function of the hand bilaterally.  Normal radial pulses bilaterally. Skin:    General: Skin is warm and dry.     Capillary Refill: Capillary refill takes less than 2 seconds.     Findings: No rash.  Neurological:     Mental Status: The patient is awake and alert.  Neurological: GCS 15 alert and  oriented x3 Normal speech, no expressive or receptive aphasia or dysarthria Cranial nerves II through XII intact Normal visual fields 5 out of 5 strength in all 4 extremities with intact sensation throughout No extremity drift Normal finger-to-nose testing, no limb or truncal ataxia      ED Results / Procedures / Treatments   Labs (all labs ordered are listed, but only abnormal results are displayed) Labs Reviewed - No data to display   EKG     RADIOLOGY I independently reviewed and interpreted imaging and agree with radiologists findings.     PROCEDURES:  Critical Care performed:   Procedures   MEDICATIONS ORDERED IN ED: Medications  acetaminophen (TYLENOL) tablet 650 mg (650 mg Oral Given 09/17/22 0917)     IMPRESSION / MDM / ASSESSMENT AND PLAN / ED COURSE  I reviewed the triage vital signs and the nursing notes.   Differential diagnosis includes, but is not limited to, contusion, concussion, intracranial hemorrhage, less likely skull fracture.  I reviewed the patient's chart.  Patient had a an MRI of her brain on 09/01/2022 without evidence of acute intracranial abnormality.  Patient is awake and alert, hemodynamically stable and afebrile.  No focal neurological deficits.  No altered mental status.  No vomiting.  No anticoagulation.  CT head obtained in triage is negative for any acute findings.  She has no midline cervical spine tenderness, normal strength and sensation of bilateral upper extremities, normal grip strength.  Do not suspect cervical spine injury such as fracture or central cord syndrome.  No indication for CT imaging of her neck per [place] criteria.  She has full normal range of motion of her neck.  We discussed her negative CT findings.  She was given Tylenol.  Discussed return precautions and outpatient follow-up.  We also discussed the possibility of a concussion and we discussed concussion precautions.  She requested a work note, and requested  that it said that she was seen for a head injury specifically.  This was provided.  She was discharged in stable condition with her husband.   Patient's presentation is most consistent with acute complicated illness / injury requiring diagnostic workup.     FINAL CLINICAL IMPRESSION(S) / ED DIAGNOSES   Final diagnoses:  Injury of head, initial encounter     Rx / DC Orders   ED Discharge Orders     None  Note:  This document was prepared using Dragon voice recognition software and may include unintentional dictation errors.   Marquette Old, PA-C 09/17/22 1019    Nathaniel Man, MD 09/17/22 [phone]

## 2022-09-20 ENCOUNTER — Other Ambulatory Visit (HOSPITAL_COMMUNITY): Payer: Self-pay

## 2022-09-20 ENCOUNTER — Other Ambulatory Visit: Payer: Self-pay | Admitting: Cardiology

## 2022-09-20 ENCOUNTER — Other Ambulatory Visit: Payer: Self-pay

## 2022-09-20 MED ORDER — DILTIAZEM HCL ER COATED BEADS 120 MG PO CP24
120.0000 mg | ORAL_CAPSULE | Freq: Every day | ORAL | 0 refills | Status: DC
  Filled 2022-09-20: qty 90, 90d supply, fill #0

## 2022-09-20 MED ORDER — CITALOPRAM HYDROBROMIDE 40 MG PO TABS
40.0000 mg | ORAL_TABLET | Freq: Every day | ORAL | 3 refills | Status: DC
  Filled 2022-09-20: qty 90, 90d supply, fill #0
  Filled 2022-12-20: qty 90, 90d supply, fill #1
  Filled 2023-03-15: qty 90, 90d supply, fill #2
  Filled 2023-06-11: qty 90, 90d supply, fill #3

## 2022-09-21 ENCOUNTER — Other Ambulatory Visit: Payer: Self-pay

## 2022-09-23 ENCOUNTER — Other Ambulatory Visit (HOSPITAL_COMMUNITY): Payer: Self-pay

## 2022-09-23 ENCOUNTER — Other Ambulatory Visit: Payer: Self-pay | Admitting: Oncology

## 2022-09-23 DIAGNOSIS — C50919 Malignant neoplasm of unspecified site of unspecified female breast: Secondary | ICD-10-CM

## 2022-09-23 MED ORDER — PALBOCICLIB 75 MG PO TABS
75.0000 mg | ORAL_TABLET | Freq: Every day | ORAL | 2 refills | Status: DC
  Filled 2022-09-23: qty 21, 28d supply, fill #0
  Filled 2022-10-20 – 2022-10-21 (×2): qty 21, 28d supply, fill #1
  Filled 2022-11-23: qty 21, 28d supply, fill #2

## 2022-09-23 NOTE — Telephone Encounter (Signed)
CBC with Differential Order: [id number] Status: Final result     Visible to patient: Yes (seen)     Next appt: 11/01/2022 at 08:00 AM in Radiology ([facility])     Dx: Malignant neoplasm of right female br...   0 Result Notes          Component Ref Range & Units 1 mo ago (08/20/22) 8 mo ago (01/26/22) 10 mo ago (11/17/21) 11 mo ago (10/13/21) 1 yr ago (09/11/21) 1 yr ago (08/14/21) 1 yr ago (07/17/21)  WBC 4.0 - 10.5 K/uL 4.6 3.4 Low  3.2 Low  4.5 4.2 3.3 Low  6.2  RBC 3.87 - 5.11 MIL/uL 4.26 3.66 Low  3.86 Low  3.85 Low  3.82 Low  3.89 3.73 Low   Hemoglobin 12.0 - 15.0 g/dL 12.9 11.7 Low  12.2 12.0 12.0 12.2 12.0  HCT 36.0 - 46.0 % 40.0 35.3 Low  37.2 37.1 37.2 37.6 36.7  MCV 80.0 - 100.0 fL 93.9 96.4 96.4 96.4 97.4 96.7 98.4  MCH 26.0 - 34.0 pg 30.3 32.0 31.6 31.2 31.4 31.4 32.2  MCHC 30.0 - 36.0 g/dL 32.3 33.1 32.8 32.3 32.3 32.4 32.7  RDW 11.5 - 15.5 % 17.4 High  15.7 High  16.8 High  16.8 High  16.2 High  15.9 High  14.9  Platelets 150 - 400 K/uL 296 171 238 199 184 151 193  Comment: SPECIMEN CHECKED FOR CLOTS  nRBC 0.0 - 0.2 % 0.0 0.0 0.0 0.4 High  0.0 0.0 0.0  Neutrophils Relative % % 66 55 62 53 59 55 61  Lymphocytes Relative % 22 32 '27 26 28 30 19  '$ Monocytes Relative % '7 9 8 17 9 11 15  '$ Eosinophils Relative % '5 3 2 2 2 3 2  '$ Basophils Relative % 0 '1 1 1 1 1 1  '$ Band Neutrophils % 0        Metamyelocytes Relative % 0        Myelocytes % 0        Promyelocytes Relative % 0        Blasts % 0        nRBC 0 /100 WBC 0        Other % 0        Neutro Abs 1.7 - 7.7 K/uL 3.1 1.8 2.0 2.4 2.5 1.8 3.8  Lymphs Abs 0.7 - 4.0 K/uL 1.0 1.1 0.9 1.2 1.2 1.0 1.1  Monocytes Absolute 0.1 - 1.0 K/uL 0.3 0.3 0.2 0.8 0.4 0.4 0.9  Eosinophils Absolute 0.0 - 0.5 K/uL 0.2 0.1 0.1 0.1 0.1 0.1 0.1  Basophils Absolute 0.0 - 0.1 K/uL 0.0 0.0 0.0 0.1 0.0 0.0 0.1  Abs Immature Granulocytes 0.00 - 0.07 K/uL 0.00 0.01 0.01 CM 0.04 CM 0.02 CM 0.01 CM 0.09 High  CM  RBC Morphology  MORPHOLOGY UNREMARKABLE MORPHOLOGY UNREMARKABLE VC, CM       WBC Morphology DIFF CONFIRMED BY SMEAR DIFF CONFIRMED BY MANUAL. VC, CM       Smear Review PLATELETS APPEAR ADEQUATE Normal platelet morphology CM       Comment: Normal platelet morphology Performed at [facility], [address] [place], [place] [address]  Immature Granulocytes  0 R 0 R 1 R 1 R 0 R 2 R  Polychromasia  PRESENT CM       Resulting Agency [place] [facility] [place] [facility] [place] [facility] [place] [facility] [place] [facility] [place] [facility] [place] [facility]  Specimen Collected: 08/20/22 13:28 Last Resulted: 08/20/22 14:59      Comprehensive metabolic panel Order: [id number] Status: Final result     Visible to patient: Yes (seen)     Next appt: 11/01/2022 at 08:00 AM in Radiology ([facility])     Dx: Malignant neoplasm of right female br...   0 Result Notes     1 HM Topic          Component Ref Range & Units 1 mo ago 4 mo ago 7 mo ago 8 mo ago 10 mo ago 11 mo ago 1 yr ago  Sodium 135 - 145 mmol/L 136  136 136 135 135 135  Potassium 3.5 - 5.1 mmol/L 3.9  4.0 3.9 4.0 4.4 4.5  Chloride 98 - 111 mmol/L 101  107 107 106 106 103  CO2 22 - 32 mmol/L 23  20 Low  22 21 Low  21 Low  24  Glucose, Bld 70 - 99 mg/dL 110 High   147 High  CM 144 High  CM 138 High  CM 88 CM 96 CM  Comment: Glucose reference range applies only to samples taken after fasting for at least 8 hours.  BUN 6 - 20 mg/dL '20  19 15 20 18 23 '$ High   Creatinine, Ser 0.44 - 1.00 mg/dL 1.16 High  1.20 High  1.14 High  1.06 High  0.94 0.88 1.08 High   Calcium 8.9 - 10.3 mg/dL 9.1  8.8 Low  7.9 Low  8.4 Low  9.0 9.3  Total Protein 6.5 - 8.1 g/dL 7.9   6.9 7.3 7.5 7.9  Albumin 3.5 - 5.0 g/dL 4.1   3.7 3.9 4.2 4.3  AST 15 - 41 U/L '26   26 22 23 20  '$ ALT 0 - 44 U/L '25   24 20 21 19  '$ Alkaline Phosphatase 38 - 126 U/L 109   83 90 88 104  Total Bilirubin 0.3 - 1.2 mg/dL 0.6   0.5 0.7 0.4 0.3  GFR, Estimated >60 mL/min 54 Low   55 Low  CM >60 CM >60 CM >60 CM  60 Low  CM  Comment: (NOTE) Calculated using the CKD-EPI Creatinine Equation (2021)  Anion gap 5 - '15 12  9 '$ CM 7 CM 8 CM 8 CM 8 CM  Comment: Performed at [facility], [address] [place], [place] [address]  Resulting Agency [facility] [place] [facility] [place] [facility] [place] [facility] [place] [facility] [place] [facility] [place] [facility]         Specimen Collected: 08/20/22 13:28 Last Resulted: 08/20/22 13:56

## 2022-09-24 ENCOUNTER — Other Ambulatory Visit: Payer: Self-pay

## 2022-09-27 ENCOUNTER — Encounter: Payer: Self-pay | Admitting: Oncology

## 2022-09-27 ENCOUNTER — Other Ambulatory Visit: Payer: Self-pay

## 2022-09-27 DIAGNOSIS — M791 Myalgia, unspecified site: Secondary | ICD-10-CM | POA: Diagnosis not present

## 2022-09-27 DIAGNOSIS — Z03818 Encounter for observation for suspected exposure to other biological agents ruled out: Secondary | ICD-10-CM | POA: Diagnosis not present

## 2022-09-27 DIAGNOSIS — R509 Fever, unspecified: Secondary | ICD-10-CM | POA: Diagnosis not present

## 2022-09-27 DIAGNOSIS — R110 Nausea: Secondary | ICD-10-CM | POA: Diagnosis not present

## 2022-09-28 ENCOUNTER — Other Ambulatory Visit: Payer: Self-pay

## 2022-09-28 ENCOUNTER — Other Ambulatory Visit (HOSPITAL_COMMUNITY): Payer: Self-pay

## 2022-09-29 ENCOUNTER — Encounter: Payer: Self-pay | Admitting: Oncology

## 2022-09-29 ENCOUNTER — Other Ambulatory Visit (HOSPITAL_COMMUNITY): Payer: Self-pay

## 2022-09-29 ENCOUNTER — Other Ambulatory Visit: Payer: Self-pay

## 2022-10-01 DIAGNOSIS — H2513 Age-related nuclear cataract, bilateral: Secondary | ICD-10-CM | POA: Diagnosis not present

## 2022-10-01 DIAGNOSIS — H35711 Central serous chorioretinopathy, right eye: Secondary | ICD-10-CM | POA: Diagnosis not present

## 2022-10-01 DIAGNOSIS — E119 Type 2 diabetes mellitus without complications: Secondary | ICD-10-CM | POA: Diagnosis not present

## 2022-10-04 ENCOUNTER — Telehealth: Payer: Self-pay | Admitting: *Deleted

## 2022-10-04 NOTE — Telephone Encounter (Signed)
Patient called asking if medical records were sent with her FMLA form as she got notification from them that it is incomplete needing documentation. I do not see anything scanned in to know what was sent

## 2022-10-05 ENCOUNTER — Encounter: Payer: Self-pay | Admitting: Oncology

## 2022-10-08 ENCOUNTER — Other Ambulatory Visit: Payer: Self-pay

## 2022-10-08 ENCOUNTER — Encounter: Payer: Self-pay | Admitting: Oncology

## 2022-10-08 IMAGING — DX DG CHEST 1V
1 series · 1 of 1 positions shown · non-contrast
Comparison: Chest CT June 16, 2009

CLINICAL DATA: Chest pain and shortness of breath

EXAM:
CHEST  1 VIEW

[chest ap]
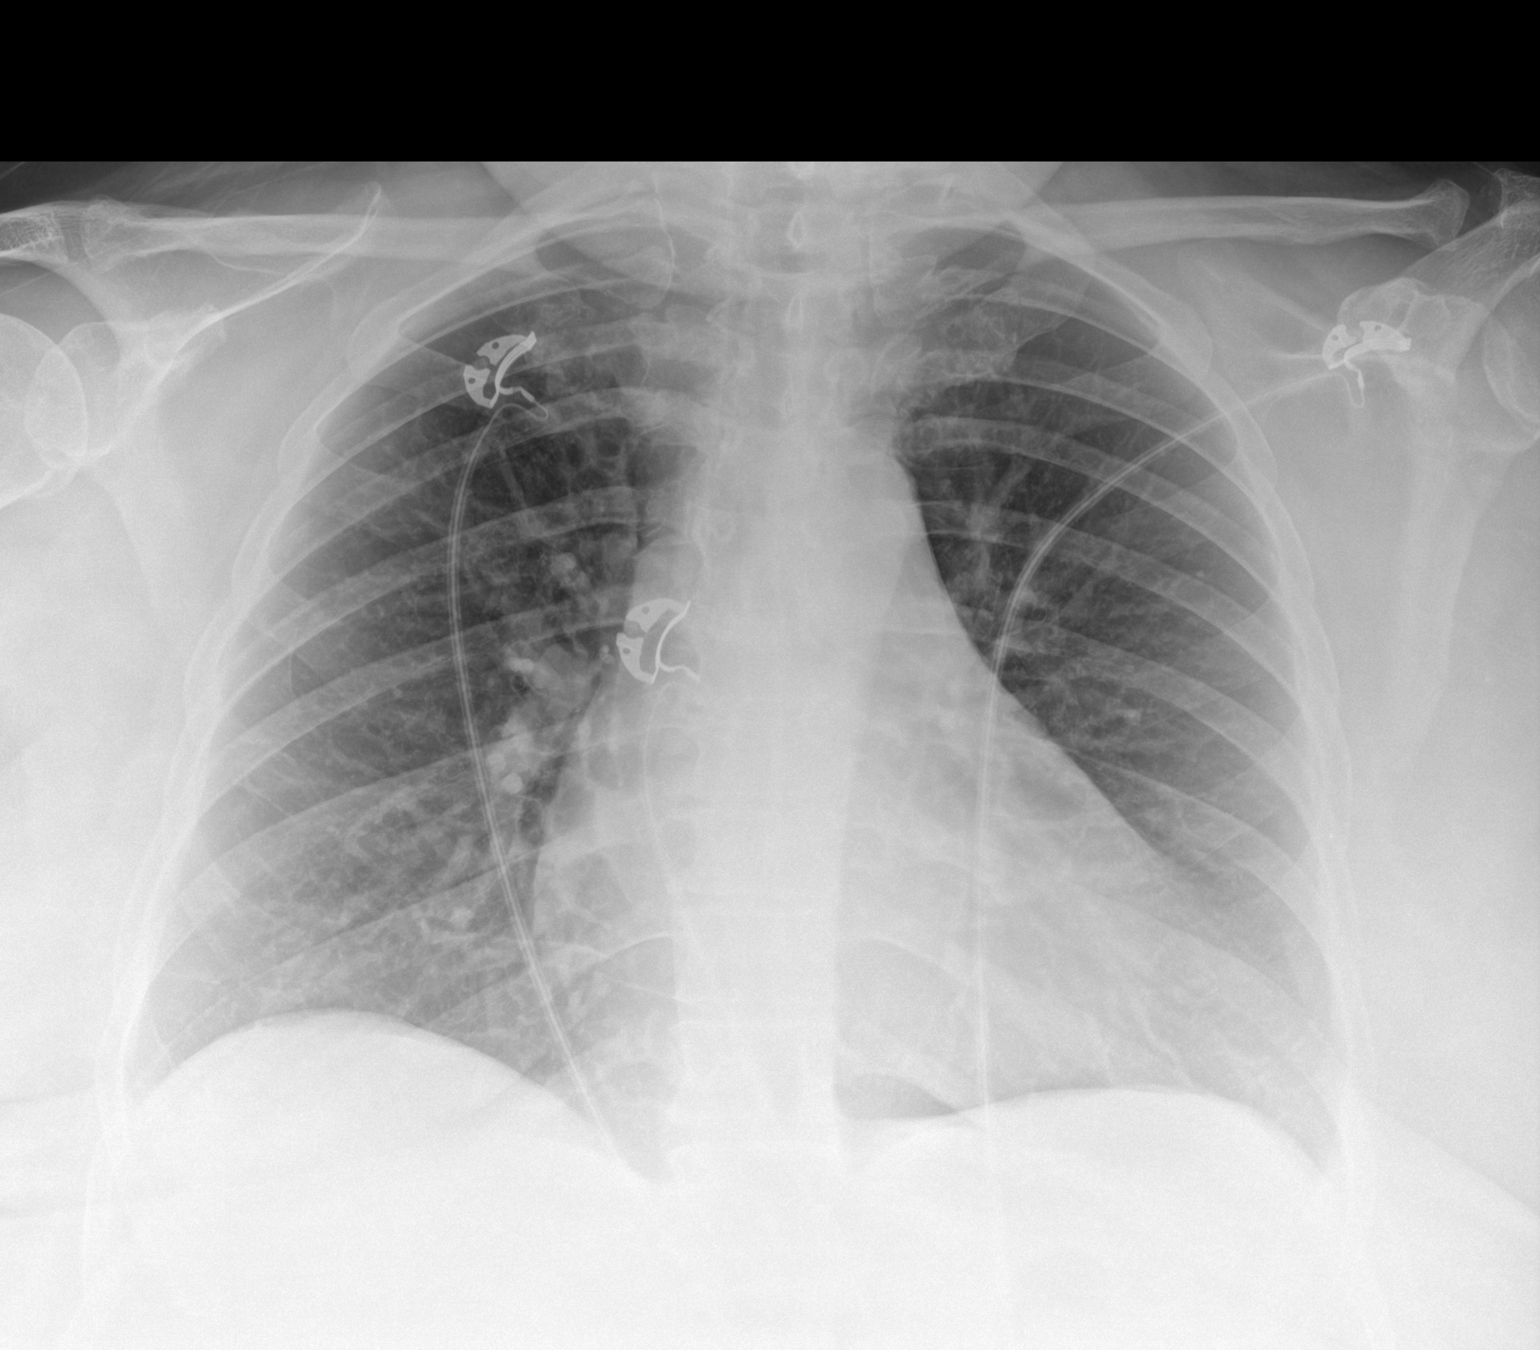

[1 of 1 positions shown; findings below may reference images not displayed]

FINDINGS: Lungs are clear. Heart is upper normal in size with pulmonary
vascularity normal. No adenopathy. No pneumothorax. No bone lesions.
IMPRESSION: Lungs clear.  Heart upper normal in size.

## 2022-10-14 ENCOUNTER — Telehealth: Payer: Self-pay | Admitting: *Deleted

## 2022-10-14 NOTE — Telephone Encounter (Signed)
Patient called stating that [employer] told her that have not received her FMLA recert. I printed iot from [employer] and went over ansers with patient and made corrections as needed and refaxed it to [employer]

## 2022-10-19 ENCOUNTER — Other Ambulatory Visit: Payer: Self-pay

## 2022-10-20 ENCOUNTER — Other Ambulatory Visit: Payer: Self-pay

## 2022-10-20 ENCOUNTER — Other Ambulatory Visit (HOSPITAL_COMMUNITY): Payer: Self-pay

## 2022-10-21 ENCOUNTER — Other Ambulatory Visit (HOSPITAL_COMMUNITY): Payer: Self-pay

## 2022-10-23 IMAGING — US US BREAST*R* LIMITED INC AXILLA
1 series · 11 of 11 positions shown · non-contrast
Comparison: Previous exam(s).

CLINICAL DATA: 59-year-old female with history of right breast
cancer in 6335 status post lumpectomy. Patient presents for
evaluation of a right axillary mass identified on recent CT scan.

EXAM:
ULTRASOUND OF THE RIGHT BREAST

[Series 1: us breast*right* limited inc axilla · 0.09mm/px · 11 of 11 slices shown]
[im 1/11]
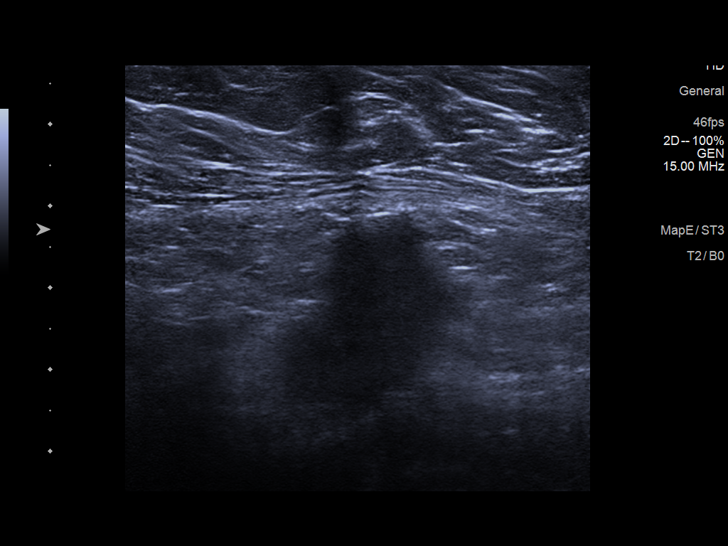
[im 2/11]
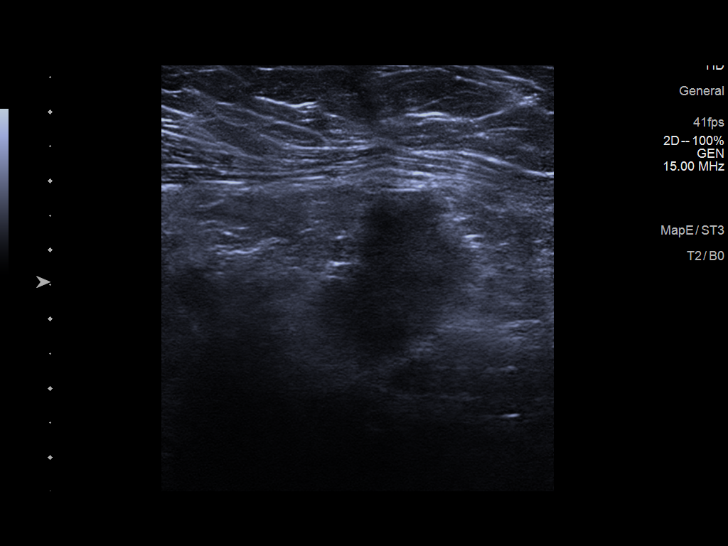
[im 3/11]
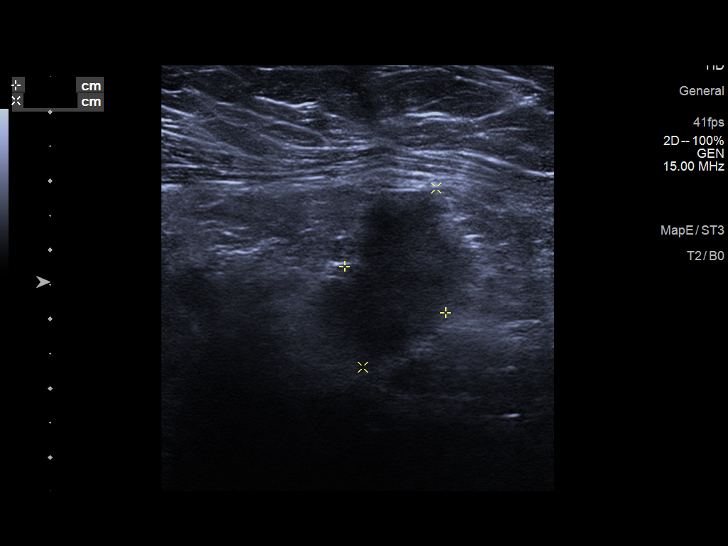
[im 4/11]
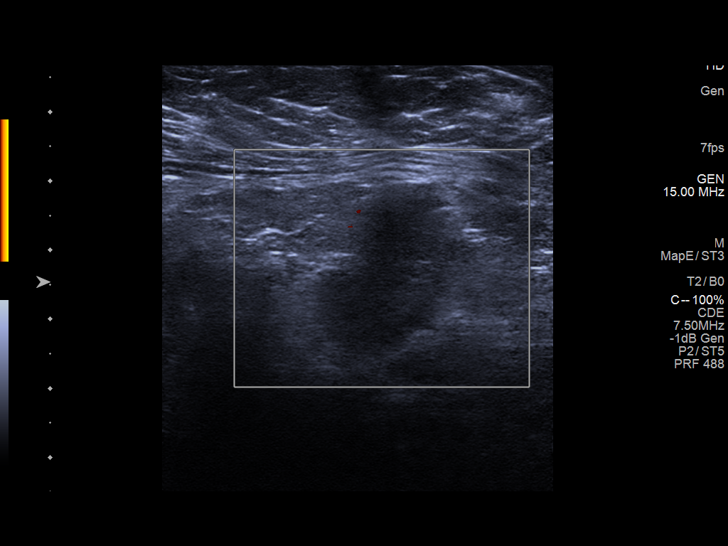
[im 5/11]
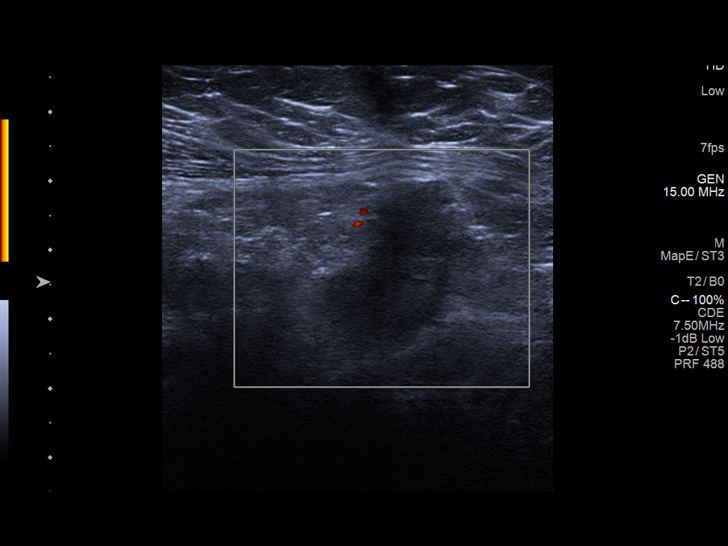
[im 6/11]
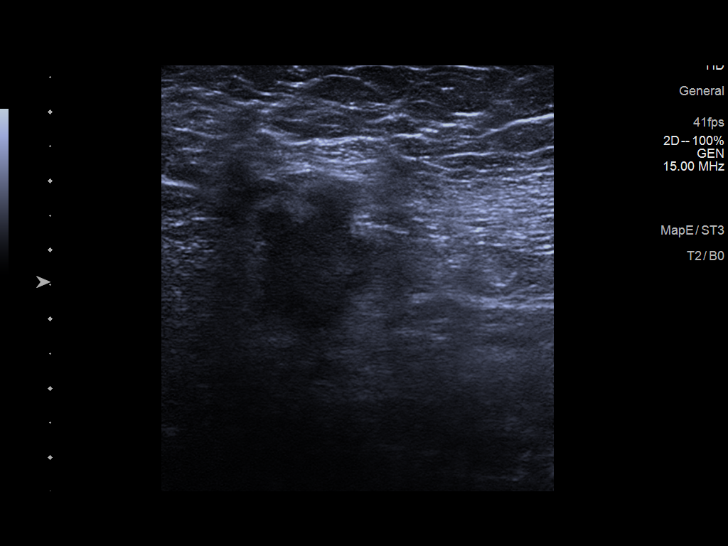
[im 7/11]
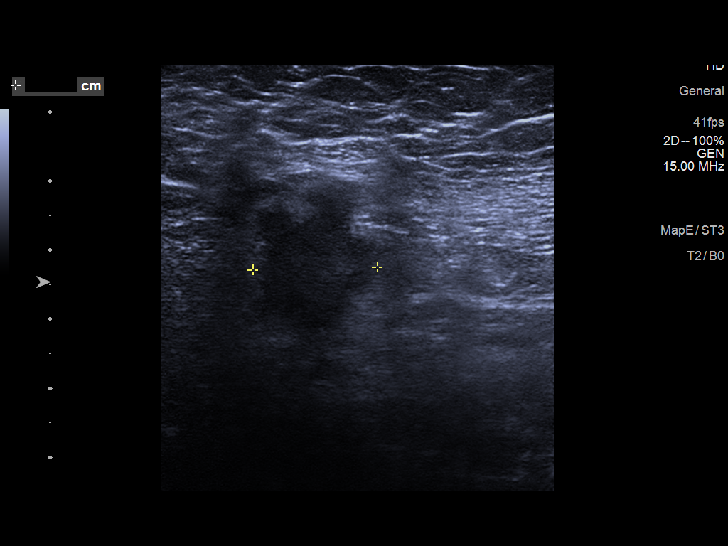
[im 8/11]
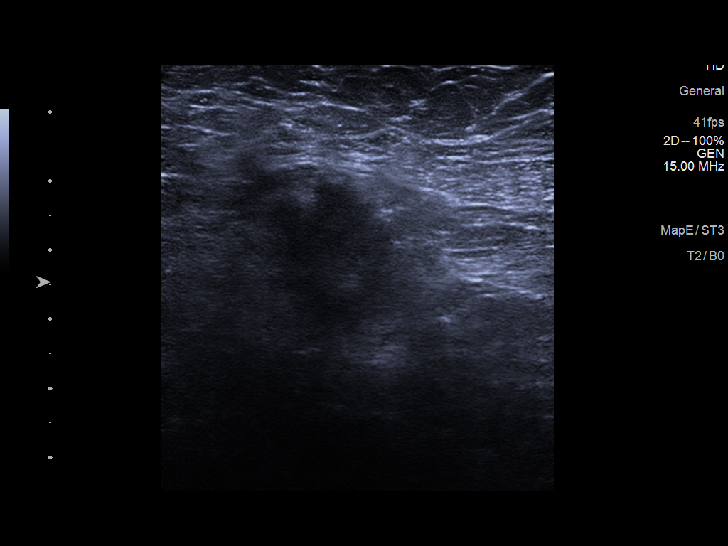
[im 9/11]
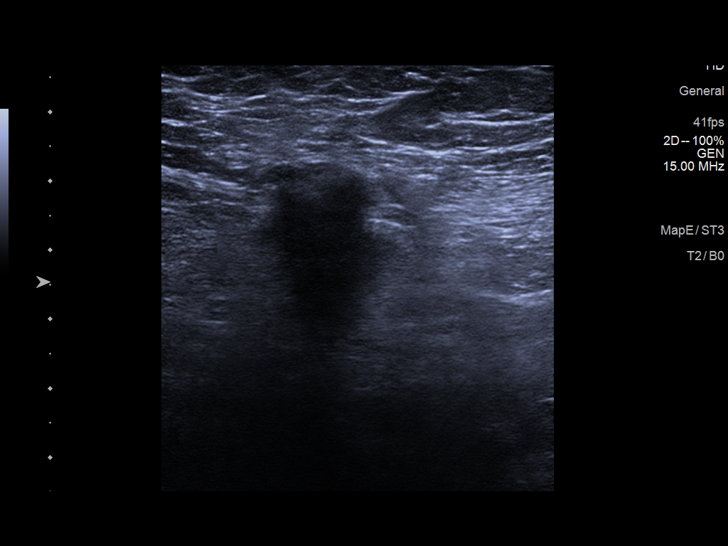
[im 10/11]
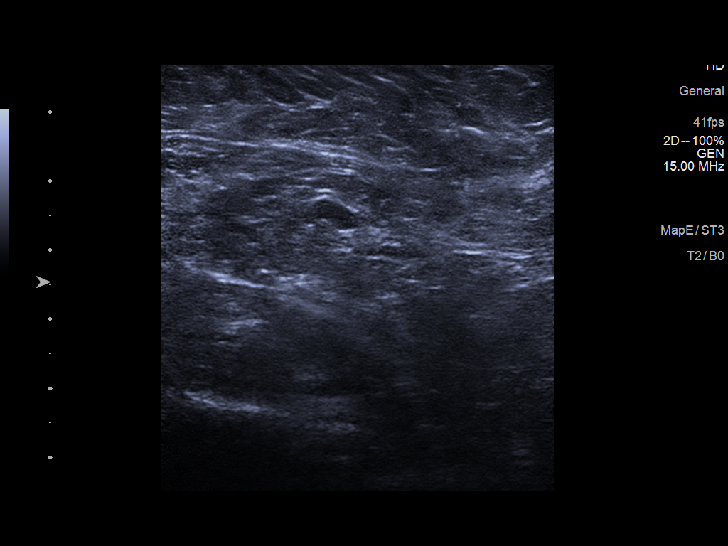
[im 11/11]
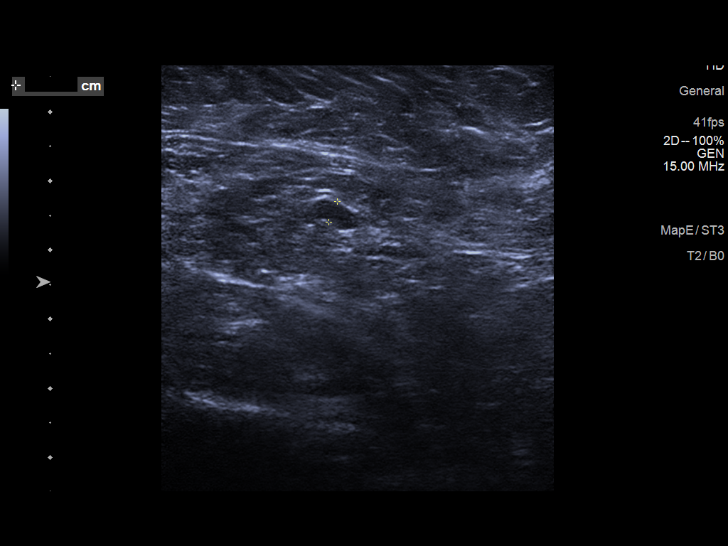

[11 of 11 positions shown; findings below may reference images not displayed]

FINDINGS: On physical exam, I feel a firm mass in the low right axilla.

Targeted ultrasound is performed in the right axilla demonstrating
an irregular hypoechoic mass measuring 2.8 x 1.6 x 1.8 cm. Targeted
ultrasound of regional axillary lymph nodes demonstrates no
additional abnormal lymph node.
IMPRESSION: Suspicious irregular mass in the right axilla measuring 2.8 cm,
possibly an abnormal lymph node versus primary malignancy. No
additional abnormal lymph nodes are identified sonographically in
the right axilla though there do appear to be additional smaller
adjacent suspicious masses on the patient's CT scan.

RECOMMENDATION:
Ultrasound-guided core needle biopsy of the right axillary mass.

I have discussed the findings and recommendations with the patient
who agrees to proceed with biopsy. The patient will be contacted by
our scheduler to make the biopsy appointment

BI-RADS CATEGORY  4: Suspicious.

## 2022-10-24 IMAGING — DX DG CHEST 1V PORT
1 series · 1 of 1 positions shown · non-contrast
Comparison: CTA chest 07/22/2020 and earlier.

CLINICAL DATA: 59-year-old female who woke at 1611 hours with
palpitations and shortness of breath. Positive for [id number].
07/14/2020.

EXAM:
PORTABLE CHEST 1 VIEW

[chest ap]
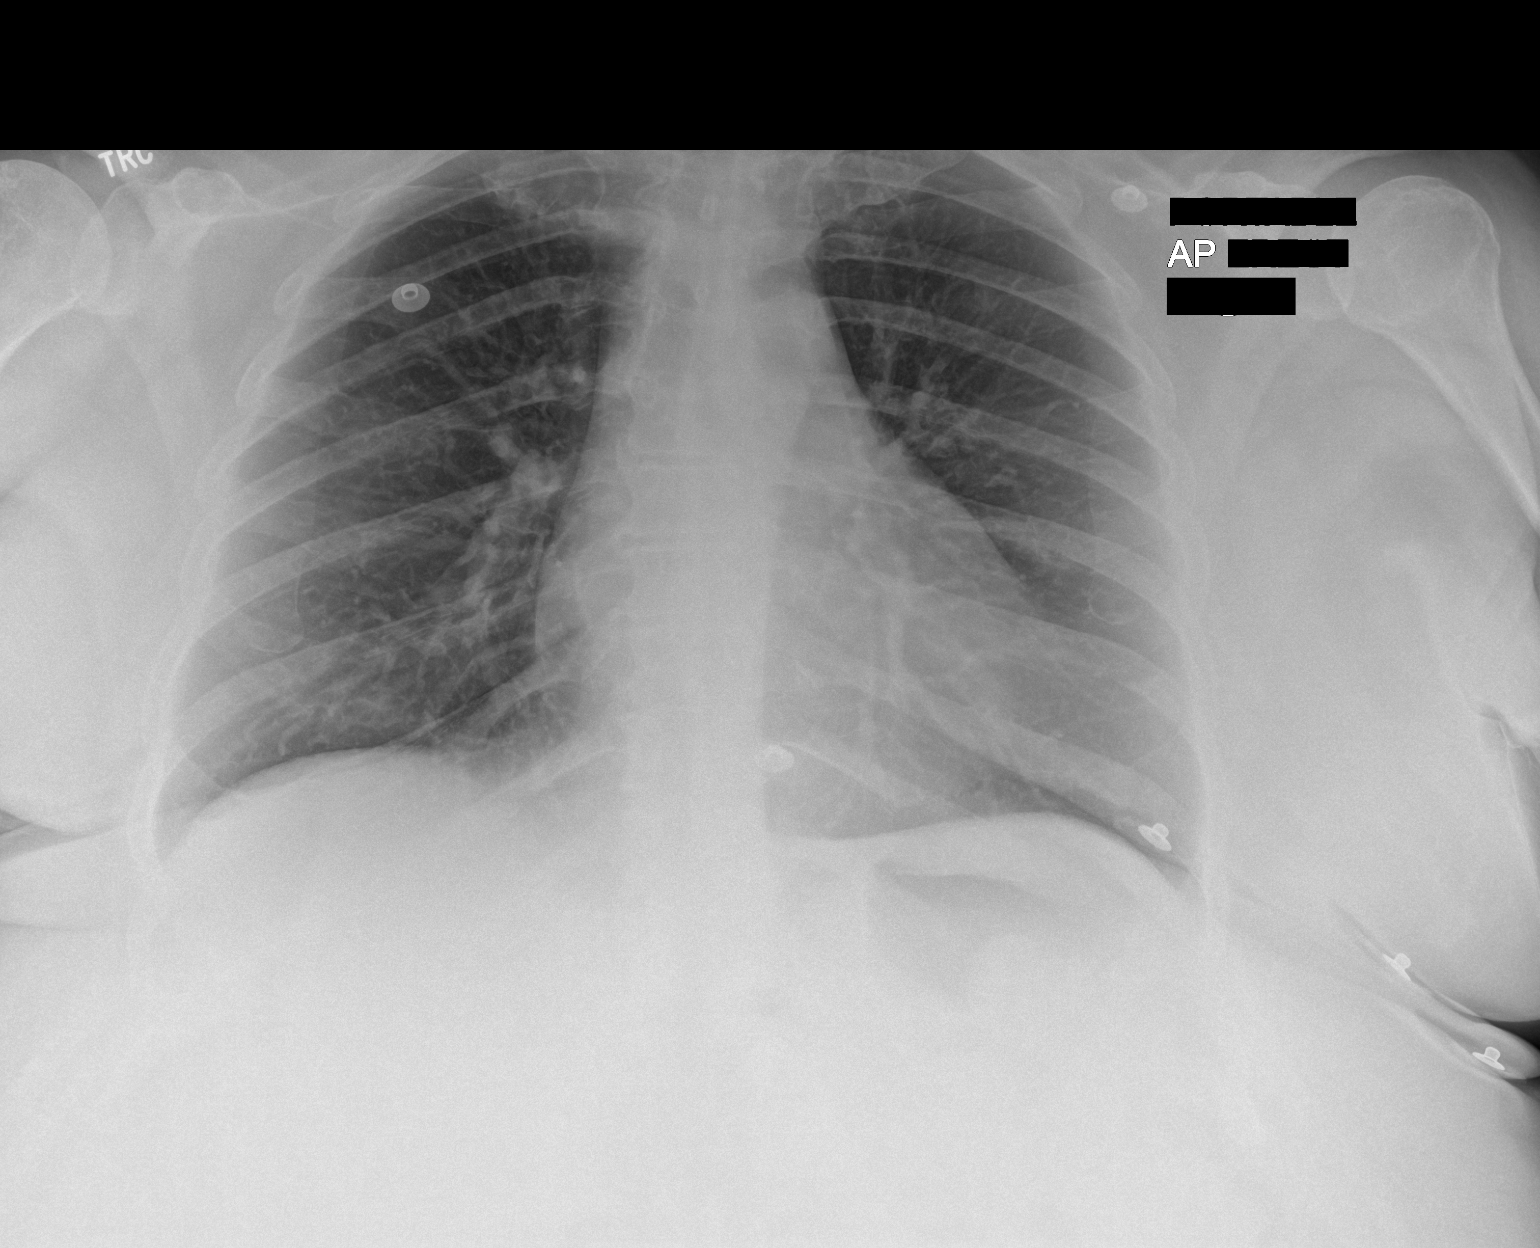

[1 of 1 positions shown; findings below may reference images not displayed]

FINDINGS: Portable AP upright view at 7601 hours. Stable lung volumes and
mediastinal contours, within normal limits. Visualized tracheal air
column is within normal limits. Allowing for portable technique the
lungs are clear. No pneumothorax. No acute osseous abnormality
identified. Paucity of bowel gas in the upper abdomen.
IMPRESSION: Negative portable chest.

## 2022-10-26 ENCOUNTER — Other Ambulatory Visit: Payer: Self-pay | Admitting: Gastroenterology

## 2022-10-26 ENCOUNTER — Other Ambulatory Visit: Payer: Self-pay | Admitting: Oncology

## 2022-10-26 ENCOUNTER — Other Ambulatory Visit: Payer: Self-pay

## 2022-10-26 ENCOUNTER — Other Ambulatory Visit (HOSPITAL_COMMUNITY): Payer: Self-pay

## 2022-10-26 MED ORDER — LETROZOLE 2.5 MG PO TABS
2.5000 mg | ORAL_TABLET | Freq: Every day | ORAL | 3 refills | Status: DC
  Filled 2022-10-26: qty 90, 90d supply, fill #0
  Filled 2023-02-04: qty 90, 90d supply, fill #1

## 2022-10-27 ENCOUNTER — Other Ambulatory Visit: Payer: Self-pay

## 2022-10-28 IMAGING — MG MM BREAST LOCALIZATION CLIP
2 series · 3 of 6 positions shown · non-contrast
Comparison: Previous exam(s).

CLINICAL DATA: Status post ultrasound-guided core biopsy of RIGHT
axillary mass.

EXAM:
DIAGNOSTIC RIGHT MAMMOGRAM POST ULTRASOUND BIOPSY

[R MLO synth-2D]
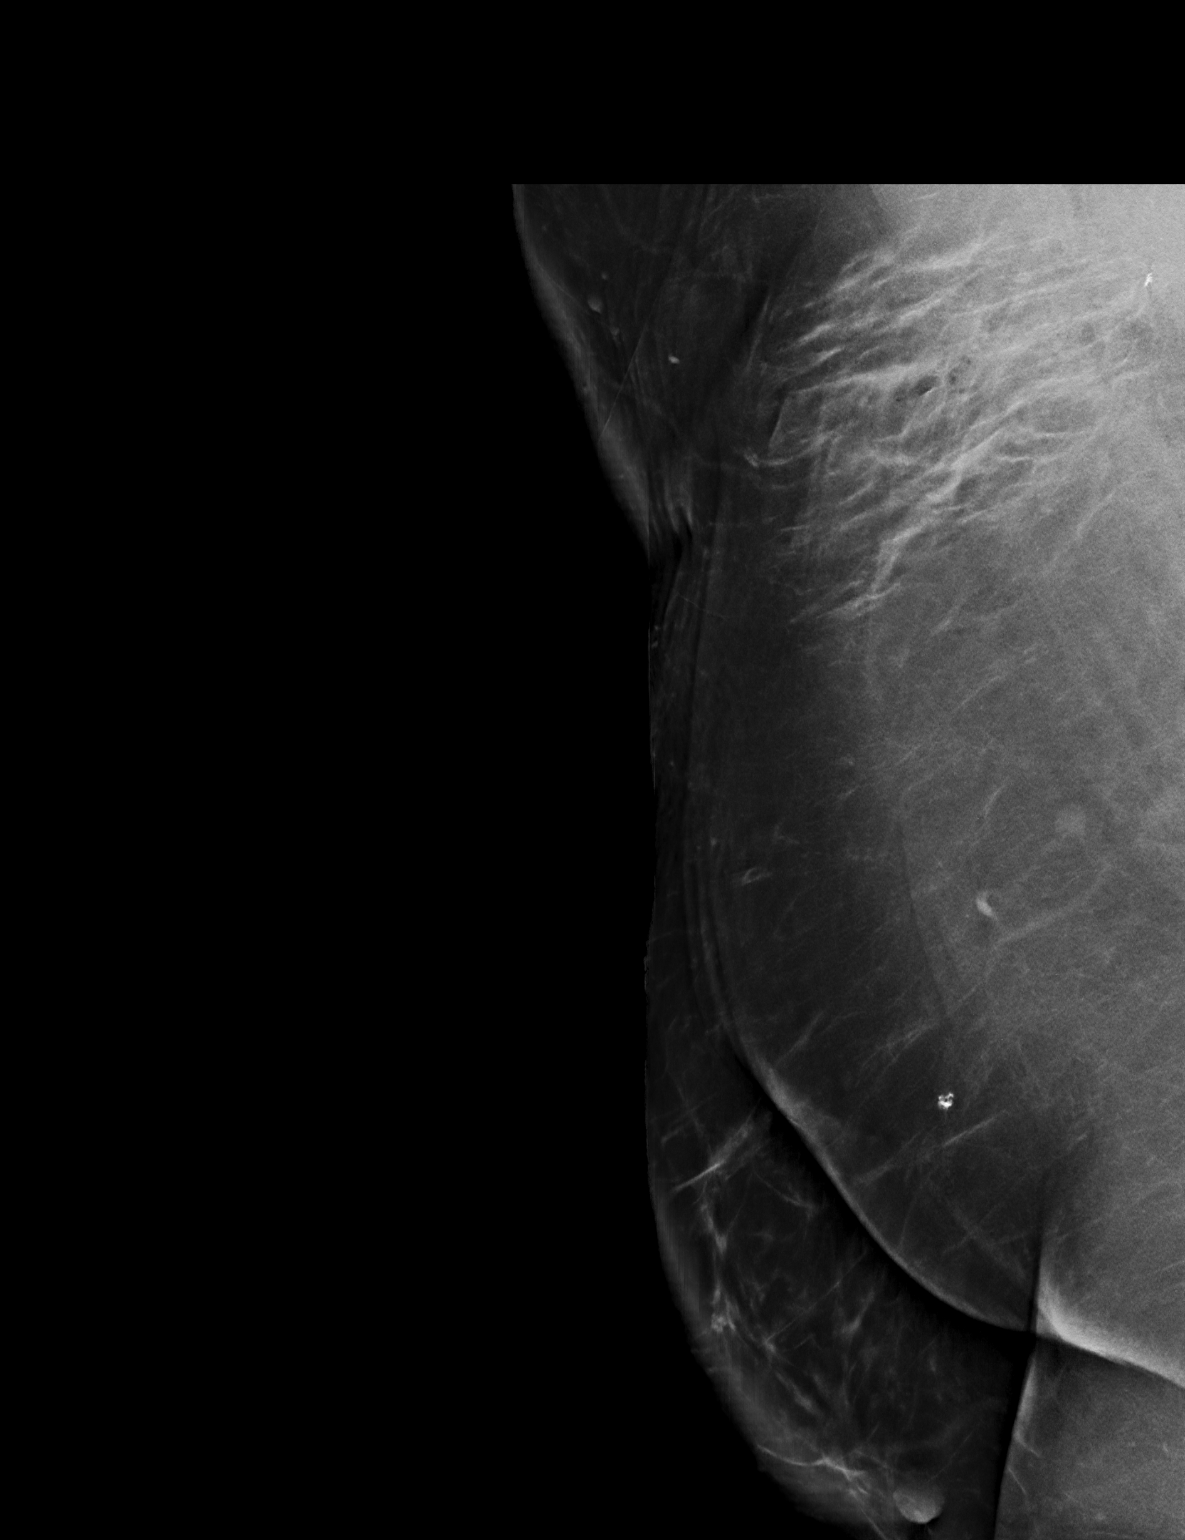

[R MLO tomo · 2 of 119 frames shown]
[frame 39/119]
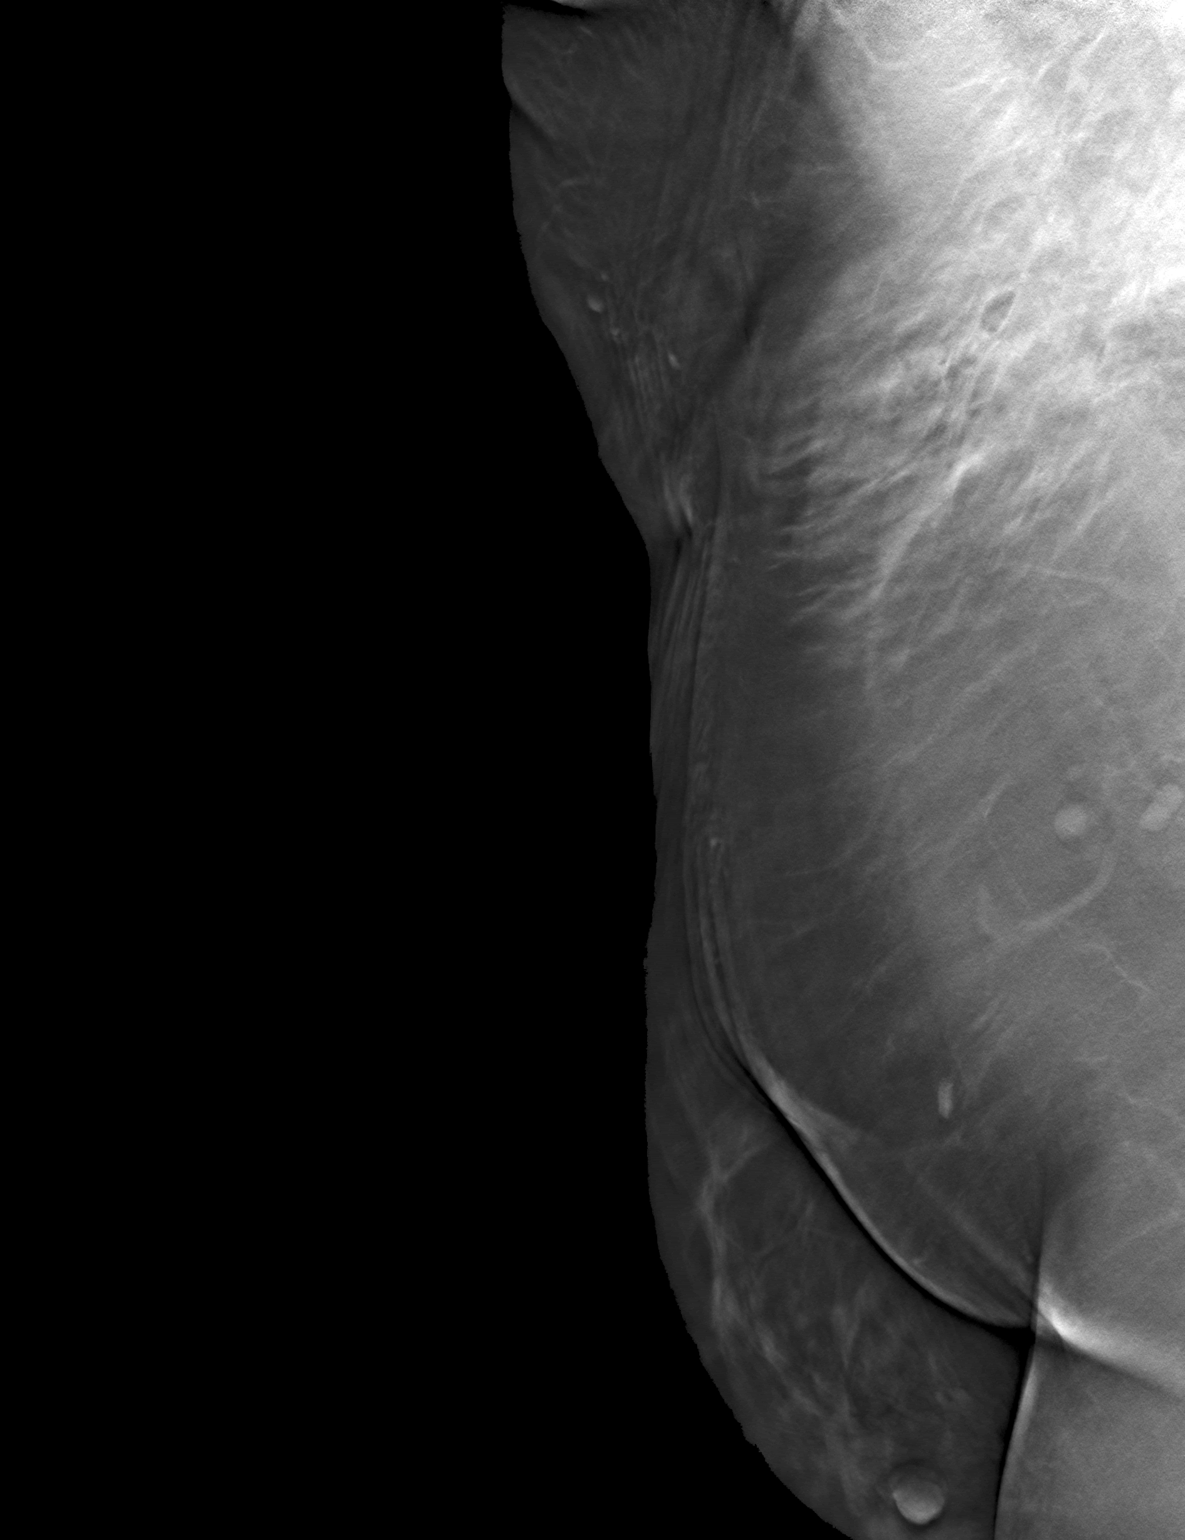
[frame 60/119]
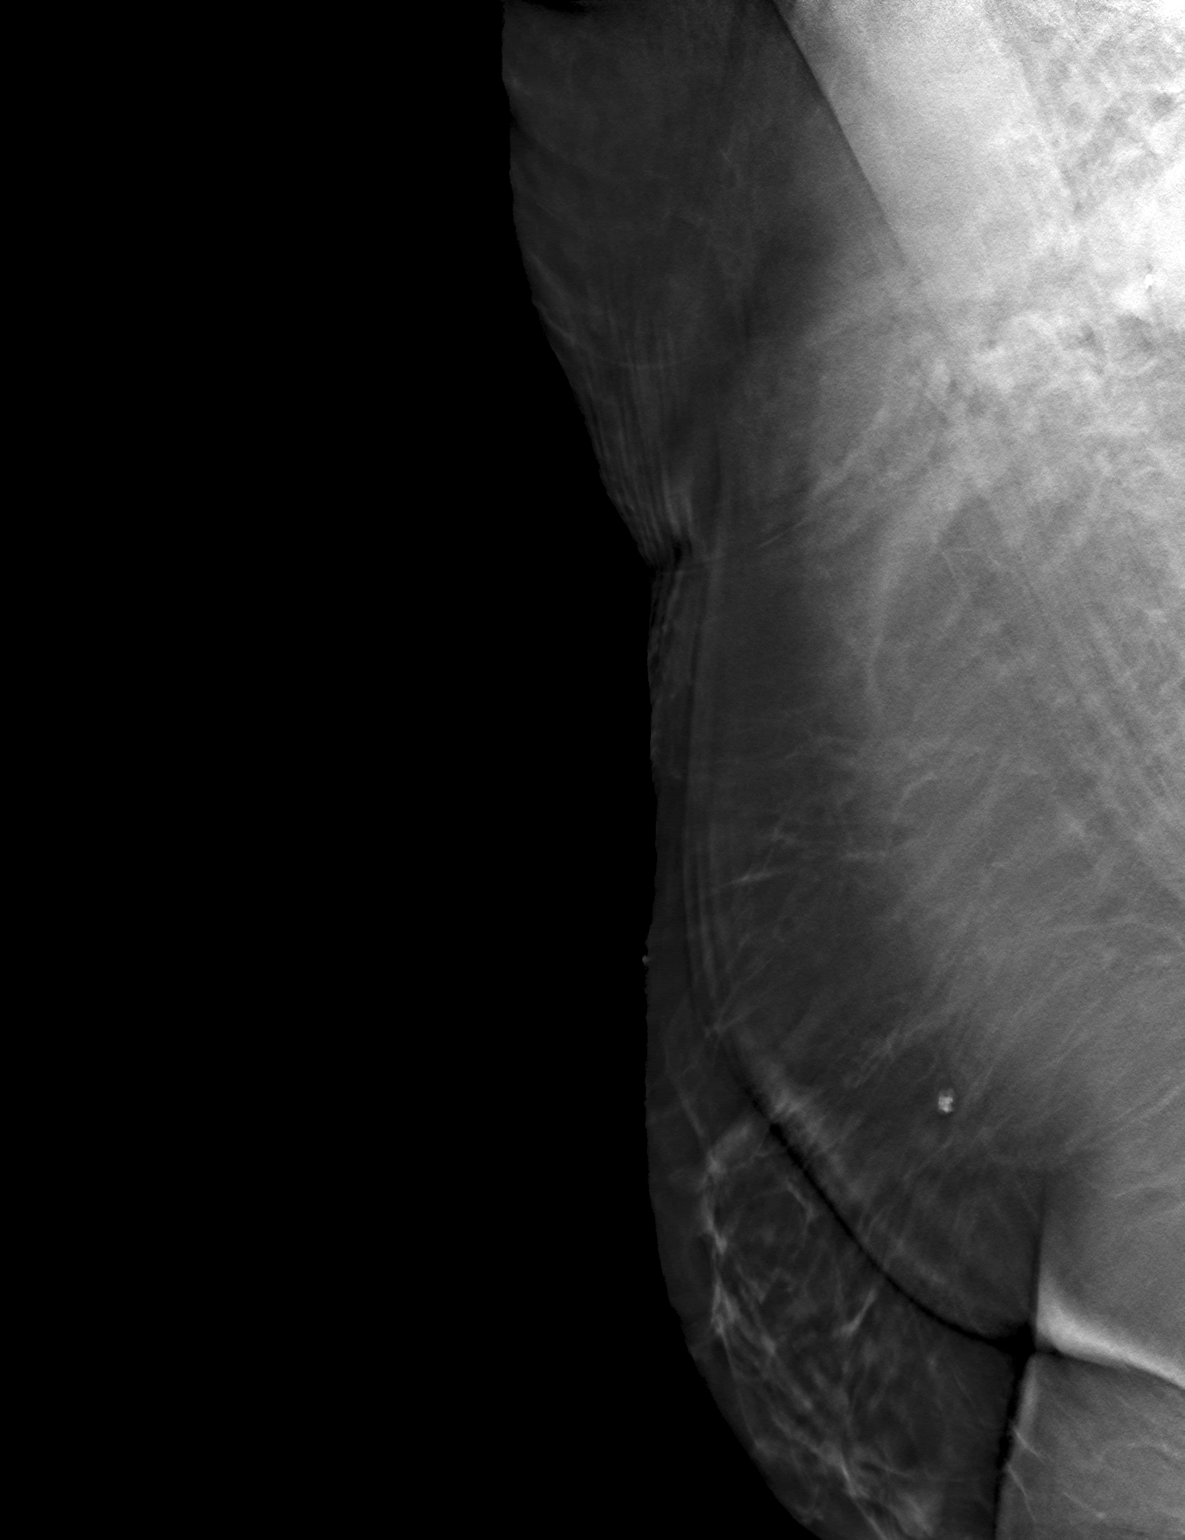

[3 of 6 positions shown; findings below may reference images not displayed]

FINDINGS: Mammographic images were obtained following ultrasound guided biopsy
of RIGHT axillary mass and placement of a butterfly HydroMARK shaped
clip. The biopsy marking clip is in expected position at the site of
biopsy.
IMPRESSION: Appropriate positioning of the butterfly HydroMARK shaped biopsy
marking clip at the site of biopsy in the location.

Final Assessment: Post Procedure Mammograms for Marker Placement

## 2022-10-29 ENCOUNTER — Other Ambulatory Visit (HOSPITAL_COMMUNITY): Payer: Self-pay

## 2022-11-01 ENCOUNTER — Ambulatory Visit
Admission: RE | Admit: 2022-11-01 | Discharge: 2022-11-01 | Disposition: A | Discharge status: Home / Self Care | Payer: 59 | Source: Ambulatory Visit | Attending: Oncology | Admitting: Oncology

## 2022-11-01 ENCOUNTER — Ambulatory Visit: Payer: 59 | Admitting: Oncology

## 2022-11-01 ENCOUNTER — Other Ambulatory Visit: Payer: 59

## 2022-11-01 DIAGNOSIS — C50911 Malignant neoplasm of unspecified site of right female breast: Secondary | ICD-10-CM | POA: Diagnosis not present

## 2022-11-01 DIAGNOSIS — K760 Fatty (change of) liver, not elsewhere classified: Secondary | ICD-10-CM | POA: Diagnosis not present

## 2022-11-01 DIAGNOSIS — C50919 Malignant neoplasm of unspecified site of unspecified female breast: Secondary | ICD-10-CM | POA: Diagnosis not present

## 2022-11-01 DIAGNOSIS — C7951 Secondary malignant neoplasm of bone: Secondary | ICD-10-CM | POA: Diagnosis not present

## 2022-11-01 DIAGNOSIS — K3189 Other diseases of stomach and duodenum: Secondary | ICD-10-CM | POA: Diagnosis not present

## 2022-11-01 MED ORDER — IOHEXOL 300 MG/ML  SOLN
100.0000 mL | Freq: Once | INTRAMUSCULAR | Status: AC | PRN
  Administered 2022-11-01: 100 mL via INTRAVENOUS

## 2022-11-01 MED ORDER — TECHNETIUM TC 99M MEDRONATE IV KIT
20.0000 | PACK | Freq: Once | INTRAVENOUS | Status: AC | PRN
  Administered 2022-11-01: 20.52 via INTRAVENOUS

## 2022-11-02 ENCOUNTER — Other Ambulatory Visit: Payer: Self-pay

## 2022-11-02 MED ORDER — OMEPRAZOLE 20 MG PO CPDR
20.0000 mg | DELAYED_RELEASE_CAPSULE | Freq: Every day | ORAL | 0 refills | Status: DC
  Filled 2022-11-02: qty 30, 30d supply, fill #0

## 2022-11-03 ENCOUNTER — Other Ambulatory Visit: Payer: Self-pay

## 2022-11-05 ENCOUNTER — Inpatient Hospital Stay: Payer: 59 | Attending: Oncology

## 2022-11-05 ENCOUNTER — Inpatient Hospital Stay (HOSPITAL_BASED_OUTPATIENT_CLINIC_OR_DEPARTMENT_OTHER): Payer: 59 | Admitting: Oncology

## 2022-11-05 ENCOUNTER — Other Ambulatory Visit: Payer: Self-pay

## 2022-11-05 ENCOUNTER — Encounter: Payer: Self-pay | Admitting: Oncology

## 2022-11-05 ENCOUNTER — Inpatient Hospital Stay: Payer: 59

## 2022-11-05 VITALS — BP 129/65 | HR 78 | Temp 98.3°F | Resp 18 | Ht 63.0 in | Wt 273.2 lb

## 2022-11-05 DIAGNOSIS — C50919 Malignant neoplasm of unspecified site of unspecified female breast: Secondary | ICD-10-CM | POA: Insufficient documentation

## 2022-11-05 DIAGNOSIS — Z7189 Other specified counseling: Secondary | ICD-10-CM | POA: Diagnosis not present

## 2022-11-05 DIAGNOSIS — Z170 Estrogen receptor positive status [ER+]: Secondary | ICD-10-CM | POA: Diagnosis not present

## 2022-11-05 DIAGNOSIS — C7951 Secondary malignant neoplasm of bone: Secondary | ICD-10-CM | POA: Insufficient documentation

## 2022-11-05 DIAGNOSIS — Z79899 Other long term (current) drug therapy: Secondary | ICD-10-CM | POA: Diagnosis not present

## 2022-11-05 DIAGNOSIS — Z79811 Long term (current) use of aromatase inhibitors: Secondary | ICD-10-CM | POA: Diagnosis not present

## 2022-11-05 LAB — COMPREHENSIVE METABOLIC PANEL
ALT: 26 U/L (ref 0–44)
AST: 24 U/L (ref 15–41)
Albumin: 4 g/dL (ref 3.5–5.0)
Alkaline Phosphatase: 89 U/L (ref 38–126)
Anion gap: 11 (ref 5–15)
BUN: 16 mg/dL (ref 6–20)
CO2: 21 mmol/L — ABNORMAL LOW (ref 22–32)
Calcium: 8.8 mg/dL — ABNORMAL LOW (ref 8.9–10.3)
Chloride: 103 mmol/L (ref 98–111)
Creatinine, Ser: 0.97 mg/dL (ref 0.44–1.00)
GFR, Estimated: >60 mL/min (ref >60)
Glucose, Bld: 102 mg/dL — ABNORMAL HIGH (ref 70–99)
Potassium: 4 mmol/L (ref 3.5–5.1)
Sodium: 135 mmol/L (ref 135–145)
Total Bilirubin: 0.4 mg/dL (ref 0.3–1.2)
Total Protein: 7.2 g/dL (ref 6.5–8.1)

## 2022-11-05 LAB — CBC WITH DIFFERENTIAL/PLATELET
Abs Immature Granulocytes: 0.01 10*3/uL (ref 0.00–0.07)
Basophils Absolute: 0 10*3/uL (ref 0.0–0.1)
Basophils Relative: 1 %
Eosinophils Absolute: 0.1 10*3/uL (ref 0.0–0.5)
Eosinophils Relative: 3 %
HCT: 35.7 % — ABNORMAL LOW (ref 36.0–46.0)
Hemoglobin: 11.6 g/dL — ABNORMAL LOW (ref 12.0–15.0)
Immature Granulocytes: 0 %
Lymphocytes Relative: 30 %
Lymphs Abs: 1 10*3/uL (ref 0.7–4.0)
MCH: 30.4 pg (ref 26.0–34.0)
MCHC: 32.5 g/dL (ref 30.0–36.0)
MCV: 93.5 fL (ref 80.0–100.0)
Monocytes Absolute: 0.4 10*3/uL (ref 0.1–1.0)
Monocytes Relative: 11 %
Neutro Abs: 1.8 10*3/uL (ref 1.7–7.7)
Neutrophils Relative %: 55 %
Platelets: 175 10*3/uL (ref 150–400)
RBC: 3.82 MIL/uL — ABNORMAL LOW (ref 3.87–5.11)
RDW: 16.8 % — ABNORMAL HIGH (ref 11.5–15.5)
WBC: 3.2 10*3/uL — ABNORMAL LOW (ref 4.0–10.5)
nRBC: 0 % (ref 0.0–0.2)

## 2022-11-05 NOTE — Progress Notes (Signed)
No concerns. 

## 2022-11-05 NOTE — Progress Notes (Signed)
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  It was a 1.4 cm tumor with negative lymph nodes grade 1 and patient went on to get lumpectomy followed by adjuvant radiation therapy.  Oncotype DX score was 6 and she did not require adjuvant chemotherapy.  She was on tamoxifen for 5 years.  She had BRCA testing done at that time which was negative.   Patient felt a lump in her right axilla in September 2021.  She underwent ultrasound of bilateral breasts which did not pick up any axillary mass.  A prior screening mammogram in September 2021 was unremarkable.  She was then admitted for Covid pneumonia and underwent CT angio chest on 07/22/2020 which incidentally picked up an axillary mass measuring 2.5 cm.  No obvious breast mass.  Axillary mass was biopsied and was consistent with high-grade invasive mammary carcinoma.  IHC was positive for GATA3 with diffuse strong nuclear staining.  There was no lymph node architecture identified in the sample and it could not be determined if it is a primary tumor within the axillary breast tissue or metastases.  ER strongly positive greater than 90%, PR 1 to 10% positive and HER-2 negative   PET CT scan showed hypermetabolic poorly marginated solid right axillary mass 2.5 x 2 cm with an SUV of 5.6.  No enlarged hypermetabolic mediastinal or hilar lymph nodes no enlarged left axillary  lymph nodes.  Subcentimeter lung nodules below PET resolution.  Small hypermetabolism in the right liver with an SUV of 6 without CT correlate equivocal for liver metastases.  Multiple hypermetabolic faintly lytic osseous lesions throughout the lumbar spine and bilateral pelvic girdle with representative supra-acetabular right iliac bone 1.9 cm lesion, anterior superior left acetabular 1.7 cm lesion and L1 1.2 cm lesion.   NGS testing showed no actionable mutations.  PD-L1 less than 1%.  CHEK2 S422 FS, K3 CA and 1068 FS, PICC 3 CA and 345H, CCN E1 gain, ERB B2 1 P-CSF 1 hour fusion, FGF 19 gain, FGF 3 gain, FGF 4gain.   Ibrance plus letrozole started in February 2022.  Baseline tumor markers not elevated.    Interval history-tolerating Ibrance 75 mg dosing well.  She has chronic fatigue and hip pain.  She had a fall in early March 2024.  CT head showed no acute pathology  ECOG PS- 1 Pain scale- 3 Opioid associated constipation- no  Review of systems- Review of Systems  Constitutional:  Positive for malaise/fatigue. Negative for chills, fever and weight loss.  HENT:  Negative for congestion, ear discharge and nosebleeds.   Eyes:  Negative for blurred vision.  Respiratory:  Negative for cough, hemoptysis, sputum production, shortness of breath and wheezing.   Cardiovascular:  Negative for chest pain, palpitations, orthopnea and claudication.  Gastrointestinal:  Negative for abdominal pain, blood in stool, constipation, diarrhea, heartburn, melena, nausea and vomiting.  Genitourinary:  Negative for dysuria, flank pain, frequency, hematuria and urgency.  Musculoskeletal:  Positive for  joint pain. Negative for back pain and myalgias.  Skin:  Negative for rash.  Neurological:  Negative for dizziness, tingling, focal weakness, seizures, weakness and headaches.  Endo/Heme/Allergies:  Does not bruise/bleed easily.  Psychiatric/Behavioral:  Negative for depression and suicidal ideas. The patient does  not have insomnia.       Allergies  Allergen Reactions   Kiwi Extract Itching and Swelling    The real kiwi fruit   Codeine Other (See Comments)    Felt funny.   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   Chicken pox    Depression    Fatty liver    Fatty liver    Gastric polyps    GERD (gastroesophageal reflux disease)    Gestational diabetes    prediabetes out side of having baby   Headache    migraines   Heart murmur    History of abnormal mammogram 08/2006, 01/2008, 03/2009   Hypertension    IBS (irritable bowel syndrome)    Joint disorder    hx of left ankle tendonitis, bursitis, heel spur   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 Smith/ laprascopic surgery   COLONOSCOPY  04/04/2000   COLONOSCOPY WITH PROPOFOL N/A 02/12/2019    Procedure: COLONOSCOPY WITH PROPOFOL;  Surgeon: Christena Deem, MD;  Location: [facility] ENDOSCOPY;  Service: Endoscopy;  Laterality: N/A;   ESOPHAGOGASTRODUODENOSCOPY (EGD) WITH PROPOFOL N/A 05/01/2015   Procedure: ESOPHAGOGASTRODUODENOSCOPY (EGD) WITH PROPOFOL;  Surgeon: Wallace Cullens, MD;  Location[facility];  Service: Gastroenterology;  Laterality: N/A;   ESOPHAGOGASTRODUODENOSCOPY (EGD) WITH PROPOFOL N/A 02/12/2019   Procedure: ESOPHAGOGASTRODUODENOSCOPY (EGD) WITH PROPOFOL;  Surgeon: Christena Deem, MD;  Location: [facility] ENDOSCOPY;  Service: Endoscopy;  Laterality: N/A;   EYE SURGERY  08/2012   laser surgery on retina/ DR Appenzeller   FINGER SURGERY     repair of tendon in right index, Dr. Benay Pillow in [facility]   FOOT SURGERY     Dr. Al Corpus   IRRIGATION AND DEBRIDEMENT SEBACEOUS CYST     right upper back   LAPAROSCOPIC SUPRACERVICAL HYSTERECTOMY  06/2007  Dr Kincius/ adhesions/CPP/adenomyosis   MASTECTOMY PARTIAL / LUMPECTOMY Right 01/2008   with sentinal lymph node biopsy/ infiltrating ductal cancer   REPAIR PERONEAL TENDONS ANKLE  10/2019   with tarsal exostectomy and sural neuroma excision on right    SKIN CANCER EXCISION     back and calves   WISDOM TOOTH EXTRACTION  1991    Social History   Socioeconomic History   Marital status: Married    Spouse name: Brett Canales   Number of children: 1   Years of education: 14   Highest education level: Not on file  Occupational History   Occupation: CMA  Tobacco Use   Smoking status: Never   Smokeless tobacco: Never  Vaping Use   Vaping Use: Never used  Substance and Sexual Activity   Alcohol use: Yes    Alcohol/week: 0.0 standard drinks of alcohol    Comment: rare, 1-2 drinks per year   Drug use: No   Sexual activity: Yes    Partners: Male    Birth control/protection: Surgical    Comment: Hysterectomy   Other Topics Concern   Not on file  Social History Narrative   Lives in [place] with husband, no pets.  Works at  [employer].      Diet - regular   Exercise - occasional   Social Determinants of Health   Financial Resource Strain: Not on file  Food Insecurity: Not on file  Transportation Needs: Not on file  Physical Activity: Insufficiently Active (07/27/2017)   Exercise Vital Sign    Days of Exercise per Week: 4 days    Minutes of Exercise per Session: 30 min  Stress: No Stress Concern Present (07/27/2017)   [employer] of Occupational Health - Occupational Stress Questionnaire    Feeling of Stress : Only a little  Social Connections: Socially Integrated (07/27/2017)   Social Connection and Isolation Panel [NHANES]    Frequency of Communication with Friends and Family: More than three times a week    Frequency of Social Gatherings with Friends and Family: Once a week    Attends Religious Services: More than 4 times per year    Active Member of [employer] or Organizations: Yes    Attends Banker Meetings: More than 4 times per year    Marital Status: Married  Catering manager Violence: Not At Risk (07/27/2017)   Humiliation, Afraid, Rape, and Kick questionnaire    Fear of Current or Ex-Partner: No    Emotionally Abused: No    Physically Abused: No    Sexually Abused: No    Family History  Problem Relation Age of Onset   Hypertension Mother    Thyroid disease Mother    Breast cancer Mother 13   Colon cancer Mother 35       again at 53   Atrial fibrillation Mother    Hip fracture Mother    Skin cancer Mother    Stroke Father    Hypertension Father    Cancer Father 41       prostate   Parkinson's disease Father    Skin cancer Father    Hypertension Sister    Thyroid disease Sister    Cancer Sister        skin/ squamous cell   Diabetes Maternal Grandmother    Stroke Maternal Grandfather    Heart disease Paternal Grandfather    Diabetes Maternal Aunt    Cancer Maternal Aunt    Breast cancer Maternal Aunt 37   Lymphoma  Maternal Uncle    Heart disease Maternal Uncle     Lung cancer Paternal Aunt 29       smoker   Thyroid cancer Cousin        maternal   Breast cancer Cousin 5       maternal   Breast cancer Cousin 57   Colon cancer Cousin      Current Outpatient Medications:    aspirin EC 81 MG tablet, Take by mouth., Disp: , Rfl:    b complex vitamins capsule, Take 1 capsule by mouth daily., Disp: , Rfl:    buPROPion (WELLBUTRIN XL) 300 MG 24 hr tablet, Take 1 tablet (300 mg total) by mouth daily., Disp: 90 tablet, Rfl: 3   butalbital-acetaminophen-caffeine (FIORICET) 50-325-40 MG tablet, Take 1 tablet by mouth every 6 (six) hours as needed for headache., Disp: 30 tablet, Rfl: 0   calcium carbonate (OSCAL) 1500 (600 Ca) MG TABS tablet, Take by mouth 2 (two) times daily with a meal., Disp: , Rfl:    Cholecalciferol (VITAMIN D3) 10 MCG (400 UNIT) CAPS, Take by mouth., Disp: , Rfl:    citalopram (CELEXA) 40 MG tablet, Take 1 tablet (40 mg total) by mouth daily., Disp: 90 tablet, Rfl: 3   diltiazem (CARDIZEM CD) 120 MG 24 hr capsule, Take 1 capsule by mouth daily. *Appt needed for future refills*, Disp: 90 capsule, Rfl: 0   diphenhydrAMINE (BENADRYL) 25 MG tablet, Take 25 mg by mouth at bedtime as needed for sleep. Pt states daily now for sleep and allergies., Disp: , Rfl:    gabapentin (NEURONTIN) 100 MG capsule, Take 1 capsule (100 mg total) by mouth every morning AND 2 capsules (200 mg total) every evening., Disp: 270 capsule, Rfl: 1   ketotifen (ZADITOR) 0.025 % ophthalmic solution, Place 1 drop into both eyes daily., Disp: , Rfl:    letrozole (FEMARA) 2.5 MG tablet, Take 1 tablet (2.5 mg total) by mouth daily., Disp: 90 tablet, Rfl: 3   lisinopril (ZESTRIL) 10 MG tablet, Take 1 tablet (10 mg total) by mouth daily., Disp: 90 tablet, Rfl: 1   metFORMIN (GLUCOPHAGE-XR) 500 MG 24 hr tablet, Take 1 tablet (500 mg total) by mouth 2 (two) times daily with a meal., Disp: 180 tablet, Rfl: 1   Multiple Vitamin (MULTIVITAMIN) tablet, Take 1 tablet by mouth  daily., Disp: , Rfl:    omeprazole (PRILOSEC) 20 MG capsule, Take 1 capsule (20 mg total) by mouth daily., Disp: 30 capsule, Rfl: 0   ondansetron (ZOFRAN) 4 MG tablet, Take 1 tablet (4 mg total) by mouth every 8 (eight) hours as needed for nausea or vomiting., Disp: 90 tablet, Rfl: 0   oxyCODONE-acetaminophen (PERCOCET/ROXICET) 5-325 MG tablet, Take 1 tablet by mouth every 6 (six) hours as needed for severe pain., Disp: 30 tablet, Rfl: 0   palbociclib (IBRANCE) 75 MG tablet, Take 1 tablet (75 mg total) by mouth daily. Take for 21 days on, 7 days off, repeat every 28 days., Disp: 21 tablet, Rfl: 2   Zoster Vaccine Adjuvanted [facility]) injection, Inject into the muscle., Disp: 0.5 mL, Rfl: 1  Physical exam:  Vitals:   11/05/22 1304  BP: 129/65  Pulse: 78  Resp: 18  Temp: 98.3 F (36.8 C)  TempSrc: Tympanic  SpO2: 100%  Weight: 273 lb 3.2 oz (123.9 kg)  Height: 5\' 3"  (1.6 m)   Physical Exam Cardiovascular:     Rate and Rhythm: Normal rate and regular rhythm.     Heart sounds: Normal  heart sounds.  Pulmonary:     Effort: Pulmonary effort is normal.     Breath sounds: Normal breath sounds.  Skin:    General: Skin is warm and dry.  Neurological:     Mental Status: She is alert and oriented to person, place, and time.         Latest Ref Rng & Units 11/05/2022   12:31 PM  CMP  Glucose 70 - 99 mg/dL 960   BUN 6 - 20 mg/dL 16   Creatinine 4.54 - 1.00 mg/dL 0.98   Sodium 119 - 147 mmol/L 135   Potassium 3.5 - 5.1 mmol/L 4.0   Chloride 98 - 111 mmol/L 103   CO2 22 - 32 mmol/L 21   Calcium 8.9 - 10.3 mg/dL 8.8   Total Protein 6.5 - 8.1 g/dL 7.2   Total Bilirubin 0.3 - 1.2 mg/dL 0.4   Alkaline Phos 38 - 126 U/L 89   AST 15 - 41 U/L 24   ALT 0 - 44 U/L 26       Latest Ref Rng & Units 11/05/2022   12:31 PM  CBC  WBC 4.0 - 10.5 K/uL 3.2   Hemoglobin 12.0 - 15.0 g/dL 82.9   Hematocrit 56.2 - 46.0 % 35.7   Platelets 150 - 400 K/uL 175     No images are attached to the  encounter.  NM Bone Scan Whole Body  Result Date: 11/03/2022 CLINICAL DATA:  Metastatic breast cancer EXAM: NUCLEAR MEDICINE WHOLE BODY BONE SCAN TECHNIQUE: Whole body anterior and posterior images were obtained approximately 3 hours after intravenous injection of radiopharmaceutical. RADIOPHARMACEUTICALS:  20.52 mCi Technetium-27m MDP IV COMPARISON:  04/29/2022 Correlation: CT chest abdomen pelvis 11/01/2022 FINDINGS: Again identified sites of increased tracer uptake in the mid and lower thoracic spine, posterior upper LEFT rib, and RIGHT pelvis consistent with osseous metastases. Degree of tracer uptake and extent of tracer uptake at the sites appears increased. No new worrisome sites of tracer accumulation are seen to suggest new metastatic lesions. Scattered degenerative type uptake of tracer at multiple joints as well as cervical spine. Expected urinary tract and soft tissue distribution of tracer. IMPRESSION: Positive exam for presence of metastatic lesions at the thoracic spine, posterior upper LEFT rib, and RIGHT pelvis, sites seen previously but demonstrating greater extent and degree of tracer uptake when compared to prior study. No new metastatic lesions identified. Electronically Signed   By: Ulyses Southward M.D.   On: 11/03/2022 10:16   CT CHEST ABDOMEN PELVIS W CONTRAST  Result Date: 11/02/2022 CLINICAL DATA:  Right-sided breast cancer diagnosed in 2009. Axillary recurrence in 2021. Known osseous metastasis. On oral chemotherapy. * Tracking Code: BO * EXAM: CT CHEST, ABDOMEN, AND PELVIS WITH CONTRAST TECHNIQUE: Multidetector CT imaging of the chest, abdomen and pelvis was performed following the standard protocol during bolus administration of intravenous contrast. RADIATION DOSE REDUCTION: This exam was performed according to the departmental dose-optimization program which includes automated exposure control, adjustment of the mA and/or kV according to patient size and/or use of iterative  reconstruction technique. CONTRAST:  OMNIPAQUE IOHEXOL 300 MG/ML  SOLN COMPARISON:  04/29/2022 FINDINGS: CT CHEST FINDINGS Cardiovascular: Normal caliber of the aorta and branch vessels. Borderline cardiomegaly. No central pulmonary embolism, on this non-dedicated study. Mediastinum/Nodes: No supraclavicular adenopathy. No axillary or subpectoral adenopathy. No mediastinal or hilar adenopathy. No internal mammary adenopathy. Lungs/Pleura: No pleural fluid.  Clear lungs. Musculoskeletal: Right mastectomy. Posterior T6 vertebral body sclerotic lesion is not significantly changed.  CT ABDOMEN PELVIS FINDINGS Hepatobiliary: Moderate hepatic steatosis without suspicious liver lesion. Cholecystectomy, without biliary ductal dilatation. Pancreas: Fatty replaced pancreatic head and uncinate process. No duct dilatation or acute inflammation. Spleen: Normal in size, without focal abnormality. Adrenals/Urinary Tract: Normal adrenal glands. 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 Normal right kidney. No hydronephrosis. Trace air within the nondependent bladder. Stomach/Bowel: Normal stomach, without wall thickening. Scattered colonic diverticula. Colonic stool burden suggests constipation. Normal terminal ileum and appendix. Normal small bowel. Normal small bowel. Vascular/Lymphatic: Normal caliber of the aorta and branch vessels. No abdominopelvic adenopathy. Reproductive: Partial hysterectomy.  No adnexal mass. Other: No significant free fluid. Mild pelvic floor laxity. No evidence of omental or peritoneal disease. Musculoskeletal: Similar sclerotic lesion involving the inferior portion of the L1 vertebral body. IMPRESSION: 1. Similar sclerotic osseous metastasis. 2. No evidence of soft tissue metastasis, status post right mastectomy. 3. Hepatic steatosis 4. 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 In the absence of clinically indicated signs/symptoms require(s) no independent follow-up. 5.  Possible  constipation. 6. Trace air in the nondependent urinary bladder. Correlate with instrumentation and if no instrumentation, consider cystitis. Electronically Signed   By: Jeronimo Greaves M.D.   On: 11/02/2022 13:02     Assessment and plan- Patient is a 62 y.o. female with metastatic ER positive breast cancer with bone metastases.  This is a visit to discuss CT scan results and further management  I have reviewed the bone scan imagesIs independently and discussed findings with the patient.  Patient has known areas of bone mets involving the thoracic spine in the right pelvis as well as posterior left upper rib.  These lesions appear more prominent with a greater degree of tracer uptake as compared to prior study but we are not seeing any evidence of new metastatic lesions.  I therefore do not think that this constitutes progression and she should continue to stay on letrozole plus Ibrance at this time.  Will repeat scans again in 6 months time.  Bone metastases: She is presently on Zometa every 3 months.  Duration of Zometa beyond 2 years is questionable and there is a potential concern for osteonecrosis of the jaw as well with Zometa.  I would therefore like to give her Zometa every 6 months at this time.  I will see her back in 3 months with labs for possible Zometa.  Patient verbalized understanding of the plan   Visit Diagnosis 1. Primary malignant neoplasm of breast with metastasis (HCC)   2. High risk medication use   3. Goals of care, counseling/discussion      Dr. Owens Shark, MD, MPH [facility] [phone] 11/05/2022 1:58 PM

## 2022-11-08 ENCOUNTER — Encounter: Payer: Self-pay | Admitting: Oncology

## 2022-11-08 ENCOUNTER — Encounter: Payer: Self-pay | Admitting: Cardiology

## 2022-11-09 ENCOUNTER — Other Ambulatory Visit: Payer: Self-pay | Admitting: *Deleted

## 2022-11-16 DIAGNOSIS — C7951 Secondary malignant neoplasm of bone: Secondary | ICD-10-CM | POA: Diagnosis not present

## 2022-11-16 DIAGNOSIS — E1122 Type 2 diabetes mellitus with diabetic chronic kidney disease: Secondary | ICD-10-CM | POA: Diagnosis not present

## 2022-11-16 DIAGNOSIS — B349 Viral infection, unspecified: Secondary | ICD-10-CM | POA: Diagnosis not present

## 2022-11-16 DIAGNOSIS — R509 Fever, unspecified: Secondary | ICD-10-CM | POA: Diagnosis not present

## 2022-11-16 DIAGNOSIS — Z03818 Encounter for observation for suspected exposure to other biological agents ruled out: Secondary | ICD-10-CM | POA: Diagnosis not present

## 2022-11-16 DIAGNOSIS — N1831 Chronic kidney disease, stage 3a: Secondary | ICD-10-CM | POA: Diagnosis not present

## 2022-11-16 DIAGNOSIS — E1159 Type 2 diabetes mellitus with other circulatory complications: Secondary | ICD-10-CM | POA: Diagnosis not present

## 2022-11-16 DIAGNOSIS — C50919 Malignant neoplasm of unspecified site of unspecified female breast: Secondary | ICD-10-CM | POA: Diagnosis not present

## 2022-11-16 DIAGNOSIS — I152 Hypertension secondary to endocrine disorders: Secondary | ICD-10-CM | POA: Diagnosis not present

## 2022-11-18 ENCOUNTER — Other Ambulatory Visit (HOSPITAL_COMMUNITY): Payer: Self-pay

## 2022-11-23 ENCOUNTER — Other Ambulatory Visit (HOSPITAL_COMMUNITY): Payer: Self-pay

## 2022-11-23 DIAGNOSIS — N1831 Chronic kidney disease, stage 3a: Secondary | ICD-10-CM | POA: Diagnosis not present

## 2022-11-23 DIAGNOSIS — E1122 Type 2 diabetes mellitus with diabetic chronic kidney disease: Secondary | ICD-10-CM | POA: Diagnosis not present

## 2022-11-23 DIAGNOSIS — F325 Major depressive disorder, single episode, in full remission: Secondary | ICD-10-CM | POA: Diagnosis not present

## 2022-11-23 DIAGNOSIS — I4892 Unspecified atrial flutter: Secondary | ICD-10-CM | POA: Diagnosis not present

## 2022-11-23 DIAGNOSIS — Z1331 Encounter for screening for depression: Secondary | ICD-10-CM | POA: Diagnosis not present

## 2022-11-23 DIAGNOSIS — C50919 Malignant neoplasm of unspecified site of unspecified female breast: Secondary | ICD-10-CM | POA: Diagnosis not present

## 2022-11-23 DIAGNOSIS — I152 Hypertension secondary to endocrine disorders: Secondary | ICD-10-CM | POA: Diagnosis not present

## 2022-11-23 DIAGNOSIS — E1159 Type 2 diabetes mellitus with other circulatory complications: Secondary | ICD-10-CM | POA: Diagnosis not present

## 2022-11-23 DIAGNOSIS — Z79899 Other long term (current) drug therapy: Secondary | ICD-10-CM | POA: Diagnosis not present

## 2022-11-23 DIAGNOSIS — Z0000 Encounter for general adult medical examination without abnormal findings: Secondary | ICD-10-CM | POA: Diagnosis not present

## 2022-11-23 MED ORDER — BUPROPION HCL ER (XL) 150 MG PO TB24
150.0000 mg | ORAL_TABLET | Freq: Every day | ORAL | 3 refills | Status: DC
  Filled 2022-11-23: qty 90, 90d supply, fill #0
  Filled 2023-02-14: qty 90, 90d supply, fill #1
  Filled 2023-05-16: qty 90, 90d supply, fill #2
  Filled 2023-08-13 – 2023-08-27 (×2): qty 90, 90d supply, fill #3

## 2022-11-24 ENCOUNTER — Other Ambulatory Visit: Payer: Self-pay | Admitting: Gastroenterology

## 2022-11-24 ENCOUNTER — Other Ambulatory Visit: Payer: Self-pay

## 2022-11-24 ENCOUNTER — Telehealth: Payer: Self-pay | Admitting: Gastroenterology

## 2022-11-24 MED ORDER — OMEPRAZOLE 20 MG PO CPDR
20.0000 mg | DELAYED_RELEASE_CAPSULE | Freq: Every day | ORAL | 2 refills | Status: DC
  Filled 2022-11-24 – 2022-11-26 (×2): qty 30, 30d supply, fill #0
  Filled 2022-12-14 – 2022-12-25 (×2): qty 30, 30d supply, fill #1
  Filled 2023-01-17: qty 30, 30d supply, fill #2

## 2022-11-24 NOTE — Telephone Encounter (Signed)
Pt left vmm for refill on omeprazole 20 mg to be sent into [facility]

## 2022-11-24 NOTE — Addendum Note (Signed)
Addended by: Radene Knee L on: 11/24/2022 02:39 PM   Modules accepted: Orders

## 2022-11-24 NOTE — Telephone Encounter (Signed)
Last office visit 11/24/2021 abdominal pain epigastric pain  Last refill 11/02/2022 30 0 refills  Has appointment on 03/09/2023

## 2022-11-24 NOTE — Telephone Encounter (Signed)
Pt left message to get medication

## 2022-11-25 ENCOUNTER — Other Ambulatory Visit: Payer: Self-pay

## 2022-11-25 ENCOUNTER — Other Ambulatory Visit (HOSPITAL_COMMUNITY): Payer: Self-pay

## 2022-11-26 ENCOUNTER — Other Ambulatory Visit (HOSPITAL_COMMUNITY): Payer: Self-pay

## 2022-11-26 ENCOUNTER — Other Ambulatory Visit: Payer: Self-pay

## 2022-12-13 ENCOUNTER — Other Ambulatory Visit (HOSPITAL_COMMUNITY): Payer: Self-pay

## 2022-12-13 ENCOUNTER — Other Ambulatory Visit: Payer: Self-pay

## 2022-12-13 MED ORDER — GABAPENTIN 100 MG PO CAPS
ORAL_CAPSULE | ORAL | 1 refills | Status: DC
  Filled 2022-12-13 – 2022-12-14 (×2): qty 270, 90d supply, fill #0
  Filled 2023-03-15: qty 270, 90d supply, fill #1

## 2022-12-13 MED ORDER — METFORMIN HCL ER 500 MG PO TB24
500.0000 mg | ORAL_TABLET | Freq: Two times a day (BID) | ORAL | 1 refills | Status: DC
  Filled 2022-12-14: qty 180, 90d supply, fill #0
  Filled 2023-03-15: qty 180, 90d supply, fill #1

## 2022-12-14 ENCOUNTER — Other Ambulatory Visit: Payer: Self-pay

## 2022-12-14 ENCOUNTER — Other Ambulatory Visit (HOSPITAL_COMMUNITY): Payer: Self-pay

## 2022-12-20 ENCOUNTER — Other Ambulatory Visit: Payer: Self-pay

## 2022-12-20 ENCOUNTER — Other Ambulatory Visit: Payer: Self-pay | Admitting: Cardiology

## 2022-12-20 ENCOUNTER — Other Ambulatory Visit (HOSPITAL_COMMUNITY): Payer: Self-pay

## 2022-12-20 MED ORDER — DILTIAZEM HCL ER COATED BEADS 120 MG PO CP24
120.0000 mg | ORAL_CAPSULE | Freq: Every day | ORAL | 0 refills | Status: DC
  Filled 2023-01-17: qty 90, 90d supply, fill #0

## 2022-12-20 MED ORDER — LISINOPRIL 10 MG PO TABS
10.0000 mg | ORAL_TABLET | Freq: Every day | ORAL | 1 refills | Status: DC
  Filled 2023-02-09: qty 90, 90d supply, fill #0
  Filled 2023-05-05: qty 90, 90d supply, fill #1

## 2022-12-21 ENCOUNTER — Other Ambulatory Visit (HOSPITAL_COMMUNITY): Payer: Self-pay

## 2022-12-22 ENCOUNTER — Other Ambulatory Visit: Payer: Self-pay | Admitting: Pharmacist

## 2022-12-22 ENCOUNTER — Other Ambulatory Visit: Payer: Self-pay

## 2022-12-22 ENCOUNTER — Other Ambulatory Visit (HOSPITAL_COMMUNITY): Payer: Self-pay

## 2022-12-22 DIAGNOSIS — C50919 Malignant neoplasm of unspecified site of unspecified female breast: Secondary | ICD-10-CM

## 2022-12-22 MED ORDER — PALBOCICLIB 75 MG PO TABS
75.0000 mg | ORAL_TABLET | Freq: Every day | ORAL | 2 refills | Status: DC
  Filled 2022-12-22: qty 21, 28d supply, fill #0
  Filled 2023-01-17 (×2): qty 21, 28d supply, fill #1
  Filled 2023-02-17: qty 21, 28d supply, fill #2

## 2022-12-23 ENCOUNTER — Other Ambulatory Visit: Payer: Self-pay

## 2022-12-27 ENCOUNTER — Other Ambulatory Visit (HOSPITAL_COMMUNITY): Payer: Self-pay

## 2023-01-03 DIAGNOSIS — H35711 Central serous chorioretinopathy, right eye: Secondary | ICD-10-CM | POA: Diagnosis not present

## 2023-01-11 NOTE — Progress Notes (Unsigned)
  Electrophysiology Office Follow up Visit Note:    Date:  01/12/2023   ID:  [name], DOB May 14, 1961, MRN [id number]  PCP:  Jacqueline Rud, MD  [facility] Cardiologist:  None  [facility] Electrophysiologist:  Lanier Prude, MD    Interval History:    Jacqueline Deleon is a 62 y.o. female who presents for a follow up visit.   Last seen Dec 02, 2021 for atrial flutter.  At the time of her last appointment she had not had a recurrence.  She has been doing well since I last saw her.  No sustained arrhythmias.  Blood pressures have been well-controlled.     Past medical, surgical, social and family history were reviewed.  ROS:   Please see the history of present illness.    All other systems reviewed and are negative.  EKGs/Labs/Other Studies Reviewed:    The following studies were reviewed today:    EKG Interpretation Date/Time:  Wednesday January 12 2023 07:50:17 EDT Ventricular Rate:  94 PR Interval:  166 QRS Duration:  68 QT Interval:  326 QTC Calculation: 407 R Axis:   70  Text Interpretation: Normal sinus rhythm Confirmed by Steffanie Dunn [phone]) on 01/12/2023 7:57:52 AM    Physical Exam:    VS:  BP 124/78   Pulse 94   Ht 5\' 3"  (1.6 m)   Wt 271 lb 9.6 oz (123.2 kg)   SpO2 98%   BMI 48.11 kg/m     Wt Readings from Last 3 Encounters:  01/12/23 271 lb 9.6 oz (123.2 kg)  11/05/22 273 lb 3.2 oz (123.9 kg)  09/17/22 272 lb (123.4 kg)     GEN:  Well nourished, well developed in no acute distress.  Obese CARDIAC: RRR, no murmurs, rubs, gallops RESPIRATORY:  Clear to auscultation without rales, wheezing or rhonchi       ASSESSMENT:    1. Atrial flutter with rapid ventricular response (HCC)   2. Cirrhosis of liver not due to alcohol (HCC)   3. Abnormal CT scan    PLAN:    In order of problems listed above:  #Atrial flutter No recurrence after her single episode. On aspirin 81 mg by mouth once daily  #NASH cirrhosis Increases her risk  of bleeding on anticoagulation so use aspirin monotherapy.  This has been discussed in detail with the patient.  #Possible cardiomegaly Noted on CT scan.  No signs or symptoms of heart failure.  Echocardiogram to assess chamber dimensions.  Follow-up 1 year with EP APP.   Signed, Steffanie Dunn, MD, [facility], [facility] 01/12/2023 8:04 AM    Electrophysiology [facility]

## 2023-01-12 ENCOUNTER — Encounter: Payer: Self-pay | Admitting: Cardiology

## 2023-01-12 ENCOUNTER — Ambulatory Visit: Payer: 59 | Attending: Cardiology | Admitting: Cardiology

## 2023-01-12 VITALS — BP 124/78 | HR 94 | Ht 63.0 in | Wt 271.6 lb

## 2023-01-12 DIAGNOSIS — K7460 Unspecified cirrhosis of liver: Secondary | ICD-10-CM | POA: Diagnosis not present

## 2023-01-12 DIAGNOSIS — R9389 Abnormal findings on diagnostic imaging of other specified body structures: Secondary | ICD-10-CM | POA: Diagnosis not present

## 2023-01-12 DIAGNOSIS — I4892 Unspecified atrial flutter: Secondary | ICD-10-CM

## 2023-01-12 NOTE — Patient Instructions (Signed)
Medication Instructions:  Your physician recommends that you continue on your current medications as directed. Please refer to the Current Medication list given to you today.  *If you need a refill on your cardiac medications before your next appointment, please call your pharmacy*  Testing/Procedures: Your physician has requested that you have an echocardiogram. Echocardiography is a painless test that uses sound waves to create images of your heart. It provides your doctor with information about the size and shape of your heart and how well your heart's chambers and valves are working. This procedure takes approximately one hour. There are no restrictions for this procedure. Please do NOT wear cologne, perfume, aftershave, or lotions (deodorant is allowed). Please arrive 15 minutes prior to your appointment time.  Follow-Up: At [facility], you and your health needs are our priority.  As part of our continuing mission to provide you with exceptional heart care, we have created designated Provider Care Teams.  These Care Teams include your primary Cardiologist (physician) and Advanced Practice Providers (APPs -  Physician Assistants and Nurse Practitioners) who all work together to provide you with the care you need, when you need it.  Your next appointment:   1 year(s)  Provider:   You may see Lanier Prude, MD or one of the following Advanced Practice Providers on your designated Care Team:   Francis Dowse, [place] [address]" [place], [place] Sherie Don, NP Canary Brim, NP

## 2023-01-17 ENCOUNTER — Other Ambulatory Visit: Payer: Self-pay

## 2023-01-17 ENCOUNTER — Other Ambulatory Visit (HOSPITAL_COMMUNITY): Payer: Self-pay

## 2023-01-17 ENCOUNTER — Other Ambulatory Visit: Payer: Self-pay | Admitting: Medical Oncology

## 2023-01-18 ENCOUNTER — Other Ambulatory Visit: Payer: Self-pay

## 2023-01-18 ENCOUNTER — Other Ambulatory Visit (HOSPITAL_COMMUNITY): Payer: Self-pay

## 2023-01-18 MED ORDER — ONDANSETRON HCL 4 MG PO TABS
4.0000 mg | ORAL_TABLET | Freq: Three times a day (TID) | ORAL | 0 refills | Status: AC | PRN
  Filled 2023-01-18: qty 90, 30d supply, fill #0

## 2023-01-19 ENCOUNTER — Other Ambulatory Visit: Payer: Self-pay

## 2023-01-25 ENCOUNTER — Other Ambulatory Visit (HOSPITAL_COMMUNITY): Payer: Self-pay

## 2023-02-03 ENCOUNTER — Other Ambulatory Visit: Payer: Self-pay | Admitting: Gastroenterology

## 2023-02-03 ENCOUNTER — Other Ambulatory Visit: Payer: Self-pay

## 2023-02-03 ENCOUNTER — Other Ambulatory Visit (HOSPITAL_COMMUNITY): Payer: Self-pay

## 2023-02-03 MED ORDER — OMEPRAZOLE 20 MG PO CPDR
20.0000 mg | DELAYED_RELEASE_CAPSULE | Freq: Every day | ORAL | 1 refills | Status: DC
  Filled 2023-02-03 – 2023-02-07 (×2): qty 30, 30d supply, fill #0

## 2023-02-04 ENCOUNTER — Inpatient Hospital Stay: Payer: 59

## 2023-02-04 ENCOUNTER — Inpatient Hospital Stay (HOSPITAL_BASED_OUTPATIENT_CLINIC_OR_DEPARTMENT_OTHER): Payer: 59 | Admitting: Oncology

## 2023-02-04 ENCOUNTER — Inpatient Hospital Stay: Payer: 59 | Attending: Oncology

## 2023-02-04 ENCOUNTER — Ambulatory Visit: Payer: 59

## 2023-02-04 ENCOUNTER — Other Ambulatory Visit: Payer: Self-pay | Admitting: *Deleted

## 2023-02-04 ENCOUNTER — Encounter: Payer: Self-pay | Admitting: Oncology

## 2023-02-04 VITALS — BP 122/64 | HR 78 | Temp 97.8°F

## 2023-02-04 VITALS — BP 141/78 | HR 78 | Temp 98.4°F | Resp 18 | Ht 63.0 in | Wt 272.1 lb

## 2023-02-04 DIAGNOSIS — Z5181 Encounter for therapeutic drug level monitoring: Secondary | ICD-10-CM

## 2023-02-04 DIAGNOSIS — Z79899 Other long term (current) drug therapy: Secondary | ICD-10-CM

## 2023-02-04 DIAGNOSIS — C50919 Malignant neoplasm of unspecified site of unspecified female breast: Secondary | ICD-10-CM

## 2023-02-04 DIAGNOSIS — C7951 Secondary malignant neoplasm of bone: Secondary | ICD-10-CM | POA: Insufficient documentation

## 2023-02-04 DIAGNOSIS — Z79811 Long term (current) use of aromatase inhibitors: Secondary | ICD-10-CM | POA: Diagnosis not present

## 2023-02-04 DIAGNOSIS — Z170 Estrogen receptor positive status [ER+]: Secondary | ICD-10-CM | POA: Diagnosis not present

## 2023-02-04 DIAGNOSIS — Z7983 Long term (current) use of bisphosphonates: Secondary | ICD-10-CM

## 2023-02-04 LAB — COMPREHENSIVE METABOLIC PANEL
ALT: 25 U/L (ref 0–44)
AST: 24 U/L (ref 15–41)
Albumin: 3.8 g/dL (ref 3.5–5.0)
Alkaline Phosphatase: 106 U/L (ref 38–126)
Anion gap: 11 (ref 5–15)
BUN: 20 mg/dL (ref 6–20)
CO2: 22 mmol/L (ref 22–32)
Calcium: 9 mg/dL (ref 8.9–10.3)
Chloride: 103 mmol/L (ref 98–111)
Creatinine, Ser: 0.99 mg/dL (ref 0.44–1.00)
GFR, Estimated: >60 mL/min (ref >60)
Glucose, Bld: 123 mg/dL — ABNORMAL HIGH (ref 70–99)
Potassium: 3.9 mmol/L (ref 3.5–5.1)
Sodium: 136 mmol/L (ref 135–145)
Total Bilirubin: 0.4 mg/dL (ref 0.3–1.2)
Total Protein: 7.3 g/dL (ref 6.5–8.1)

## 2023-02-04 LAB — CBC WITH DIFFERENTIAL/PLATELET
Abs Immature Granulocytes: 0.03 10*3/uL (ref 0.00–0.07)
Basophils Absolute: 0 10*3/uL (ref 0.0–0.1)
Basophils Relative: 1 %
Eosinophils Absolute: 0.1 10*3/uL (ref 0.0–0.5)
Eosinophils Relative: 2 %
HCT: 35.6 % — ABNORMAL LOW (ref 36.0–46.0)
Hemoglobin: 11.4 g/dL — ABNORMAL LOW (ref 12.0–15.0)
Immature Granulocytes: 1 %
Lymphocytes Relative: 29 %
Lymphs Abs: 1.1 10*3/uL (ref 0.7–4.0)
MCH: 30.3 pg (ref 26.0–34.0)
MCHC: 32 g/dL (ref 30.0–36.0)
MCV: 94.7 fL (ref 80.0–100.0)
Monocytes Absolute: 0.4 10*3/uL (ref 0.1–1.0)
Monocytes Relative: 11 %
Neutro Abs: 2.1 10*3/uL (ref 1.7–7.7)
Neutrophils Relative %: 56 %
Platelets: 180 10*3/uL (ref 150–400)
RBC: 3.76 MIL/uL — ABNORMAL LOW (ref 3.87–5.11)
RDW: 16.7 % — ABNORMAL HIGH (ref 11.5–15.5)
WBC: 3.8 10*3/uL — ABNORMAL LOW (ref 4.0–10.5)
nRBC: 0 % (ref 0.0–0.2)

## 2023-02-04 MED ORDER — ZOLEDRONIC ACID 4 MG/100ML IV SOLN
4.0000 mg | INTRAVENOUS | Status: DC
  Administered 2023-02-04: 4 mg via INTRAVENOUS
  Filled 2023-02-04: qty 100

## 2023-02-04 MED ORDER — SODIUM CHLORIDE 0.9 % IV SOLN
INTRAVENOUS | Status: DC
  Filled 2023-02-04: qty 250

## 2023-02-04 NOTE — Patient Instructions (Signed)

## 2023-02-05 ENCOUNTER — Encounter: Payer: Self-pay | Admitting: Oncology

## 2023-02-06 ENCOUNTER — Encounter: Payer: Self-pay | Admitting: Oncology

## 2023-02-06 NOTE — Progress Notes (Signed)
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  It was a 1.4 cm tumor with negative lymph nodes grade 1 and patient went on to get lumpectomy followed by adjuvant radiation therapy.  Oncotype DX score was 6 and she did not require adjuvant chemotherapy.  She was on tamoxifen for 5 years.  She had BRCA testing done at that time which was negative.   Patient felt a lump in her right axilla in September 2021.  She underwent ultrasound of bilateral breasts which did not pick up any axillary mass.  A prior screening mammogram in September 2021 was unremarkable.  She was then admitted for Covid pneumonia and underwent CT angio chest on 07/22/2020 which incidentally picked up an axillary mass measuring 2.5 cm.  No obvious breast mass.  Axillary mass was biopsied and was consistent with high-grade invasive mammary carcinoma.  IHC was positive for GATA3 with diffuse strong nuclear staining.  There was no lymph node architecture identified in the sample and it could not be determined if it is a primary tumor within the axillary breast tissue or metastases.  ER strongly positive greater than 90%, PR 1 to 10% positive and HER-2 negative   PET CT scan showed hypermetabolic poorly marginated solid right axillary mass 2.5 x 2 cm with an SUV of 5.6.  No enlarged hypermetabolic mediastinal or hilar lymph  nodes no enlarged left axillary lymph nodes.  Subcentimeter lung nodules below PET resolution.  Small hypermetabolism in the right liver with an SUV of 6 without CT correlate equivocal for liver metastases.  Multiple hypermetabolic faintly lytic osseous lesions throughout the lumbar spine and bilateral pelvic girdle with representative supra-acetabular right iliac bone 1.9 cm lesion, anterior superior left acetabular 1.7 cm lesion and L1 1.2 cm lesion.   NGS testing showed no actionable mutations.  PD-L1 less than 1%.  CHEK2 S422 FS, K3 CA and 1068 FS, PICC 3 CA and 345H, CCN E1 gain, ERB B2 1 P-CSF 1 hour fusion, FGF 19 gain, FGF 3 gain, FGF 4gain.   Ibrance plus letrozole started in February 2022.  Baseline tumor markers not elevated.    Interval history-patient is tolerating letrozole along with Ibrance well.  She works full-time and does report fatigue at the end of the day.  Denies any new pain today.  ECOG PS- 1 Pain scale- 0   Review of systems- Review of Systems  Constitutional:  Positive for malaise/fatigue. Negative for chills, fever and weight loss.  HENT:  Negative for congestion, ear discharge and nosebleeds.   Eyes:  Negative for blurred vision.  Respiratory:  Negative for cough, hemoptysis, sputum production, shortness of breath and wheezing.   Cardiovascular:  Negative for chest pain, palpitations, orthopnea and claudication.  Gastrointestinal:  Negative for abdominal pain, blood in stool, constipation, diarrhea, heartburn, melena, nausea and vomiting.  Genitourinary:  Negative for dysuria, flank pain, frequency, hematuria and urgency.  Musculoskeletal:  Negative for back pain, joint pain and myalgias.  Skin:  Negative for rash.  Neurological:  Negative for dizziness, tingling, focal weakness, seizures, weakness and headaches.  Endo/Heme/Allergies:  Does not bruise/bleed easily.  Psychiatric/Behavioral:  Negative for depression and suicidal ideas. The patient does not have  insomnia.       Allergies  Allergen Reactions   Kiwi Extract Itching and Swelling    The real kiwi fruit   Codeine Other (See Comments)    Felt funny.   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   Chicken pox    Depression    Fatty liver    Fatty liver    Gastric polyps    GERD (gastroesophageal reflux disease)    Gestational diabetes    prediabetes out side of having baby   Headache    migraines   Heart murmur    History of abnormal mammogram 08/2006, 01/2008, 03/2009   Hypertension    IBS (irritable bowel syndrome)    Joint disorder    hx of left ankle tendonitis, bursitis, heel spur   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 Smith/ laprascopic surgery   COLONOSCOPY  04/04/2000   COLONOSCOPY WITH PROPOFOL N/A 02/12/2019    Procedure: COLONOSCOPY WITH PROPOFOL;  Surgeon: Christena Deem, MD;  Location: [facility] ENDOSCOPY;  Service: Endoscopy;  Laterality: N/A;   ESOPHAGOGASTRODUODENOSCOPY (EGD) WITH PROPOFOL N/A 05/01/2015   Procedure: ESOPHAGOGASTRODUODENOSCOPY (EGD) WITH PROPOFOL;  Surgeon: Wallace Cullens, MD;  Location: [facility] ENDOSCOPY;  Service: Gastroenterology;  Laterality: N/A;   ESOPHAGOGASTRODUODENOSCOPY (EGD) WITH PROPOFOL N/A 02/12/2019   Procedure: ESOPHAGOGASTRODUODENOSCOPY (EGD) WITH PROPOFOL;  Surgeon: Christena Deem, MD;  Location: [facility] ENDOSCOPY;  Service: Endoscopy;  Laterality: N/A;   EYE SURGERY  08/2012   laser surgery on retina/ DR Appenzeller   FINGER SURGERY     repair of tendon in right index, Dr. Benay Pillow in [facility]   FOOT SURGERY     Dr. Al Corpus   IRRIGATION AND DEBRIDEMENT SEBACEOUS CYST     right upper back   LAPAROSCOPIC SUPRACERVICAL HYSTERECTOMY  06/2007  Dr Kincius/ adhesions/CPP/adenomyosis   MASTECTOMY PARTIAL / LUMPECTOMY Right 01/2008   with sentinal lymph node biopsy/ infiltrating ductal cancer   REPAIR PERONEAL TENDONS ANKLE  10/2019   with tarsal exostectomy and sural neuroma excision on right    SKIN CANCER EXCISION     back and calves   WISDOM TOOTH EXTRACTION  1991    Social History   Socioeconomic History   Marital status: Married    Spouse name: Brett Canales   Number of children: 1   Years of education: 14   Highest education level: Not on file  Occupational History   Occupation: CMA  Tobacco Use   Smoking status: Never   Smokeless tobacco: Never  Vaping Use   Vaping status: Never Used  Substance and Sexual Activity   Alcohol use: Yes    Alcohol/week: 0.0 standard drinks of alcohol    Comment: rare, 1-2 drinks per year   Drug use: No   Sexual activity: Yes    Partners: Male    Birth control/protection: Surgical    Comment: Hysterectomy   Other Topics Concern   Not on file  Social History Narrative   Lives in [place] with husband, no pets.  Works at  [employer].      Diet - regular   Exercise - occasional   Social Determinants of Health   Financial Resource Strain: Low Risk  (11/23/2022)   Received from [facility], [facility]   Overall Financial Resource Strain (CARDIA)    Difficulty of Paying Living Expenses: Not hard at all  Food Insecurity: No Food Insecurity (11/23/2022)   Received from [facility], [facility]   Hunger Vital Sign    Worried About Running Out of Food in the Last Year: Never true    Ran Out of Food in the Last Year: Never true  Transportation Needs: No Transportation Needs (11/23/2022)   Received from [facility], [facility]   [facility] - Transportation    In the past 12 months, has lack of transportation kept you from medical appointments or from getting medications?: No    Lack of Transportation (Non-Medical): No  Physical Activity: Insufficiently Active (07/27/2017)   Exercise Vital Sign    Days of Exercise per Week: 4 days    Minutes of Exercise per Session: 30 min  Stress: No Stress Concern Present (07/27/2017)   [employer] of Occupational Health - Occupational Stress Questionnaire    Feeling of Stress : Only a little  Social Connections: Socially Integrated (07/27/2017)   Social Connection and Isolation Panel [NHANES]    Frequency of Communication with Friends and Family: More than three times a week    Frequency of Social Gatherings with Friends and Family: Once a week    Attends Religious Services: More than 4 times per year    Active Member of [employer] or Organizations: Yes    Attends Engineer, structural: More than 4 times per year    Marital Status: Married  Catering manager Violence: Not At Risk (07/27/2017)   Humiliation, Afraid, Rape, and Kick questionnaire    Fear of Current or Ex-Partner: No    Emotionally Abused: No    Physically Abused: No    Sexually Abused: No     Family History  Problem Relation Age of Onset   Hypertension Mother    Thyroid disease Mother    Breast cancer Mother 33   Colon cancer Mother 67  again at 45   Atrial fibrillation Mother    Hip fracture Mother    Skin cancer Mother    Stroke Father    Hypertension Father    Cancer Father 71       prostate   Parkinson's disease Father    Skin cancer Father    Hypertension Sister    Thyroid disease Sister    Cancer Sister        skin/ squamous cell   Diabetes Maternal Grandmother    Stroke Maternal Grandfather    Heart disease Paternal Grandfather    Diabetes Maternal Aunt    Cancer Maternal Aunt    Breast cancer Maternal Aunt 93   Lymphoma Maternal Uncle    Heart disease Maternal Uncle    Lung cancer Paternal Aunt 26       smoker   Thyroid cancer Cousin        maternal   Breast cancer Cousin 5       maternal   Breast cancer Cousin 57   Colon cancer Cousin      Current Outpatient Medications:    aspirin EC 81 MG tablet, Take by mouth., Disp: , Rfl:    b complex vitamins capsule, Take 1 capsule by mouth daily., Disp: , Rfl:    buPROPion (WELLBUTRIN XL) 150 MG 24 hr tablet, Take 1 tablet (150 mg total) by mouth daily. Take along a 300mg  tablet for a total daily dose of 450mg ., Disp: 90 tablet, Rfl: 3   buPROPion (WELLBUTRIN XL) 300 MG 24 hr tablet, Take 1 tablet (300 mg total) by mouth daily., Disp: 90 tablet, Rfl: 3   butalbital-acetaminophen-caffeine (FIORICET) 50-325-40 MG tablet, Take 1 tablet by mouth every 6 (six) hours as needed for headache., Disp: 30 tablet, Rfl: 0   calcium carbonate (OSCAL) 1500 (600 Ca) MG TABS tablet, Take by mouth 2 (two) times daily with a meal., Disp: , Rfl:    Cholecalciferol (VITAMIN D3) 10 MCG (400 UNIT) CAPS, Take by mouth., Disp: , Rfl:    citalopram (CELEXA) 40 MG tablet, Take 1 tablet (40 mg total) by mouth daily., Disp: 90 tablet, Rfl: 3   diltiazem (CARDIZEM CD) 120 MG 24 hr capsule, Take 1 capsule by mouth daily.  *Appt needed for future refills*, Disp: 90 capsule, Rfl: 0   diphenhydrAMINE (BENADRYL) 25 MG tablet, Take 25 mg by mouth at bedtime as needed for sleep. Pt states daily now for sleep and allergies., Disp: , Rfl:    gabapentin (NEURONTIN) 100 MG capsule, Take 1 capsule (100 mg total) by mouth every morning AND 2 capsules (200 mg total) every evening., Disp: 270 capsule, Rfl: 1   ketotifen (ZADITOR) 0.025 % ophthalmic solution, Place 1 drop into both eyes daily., Disp: , Rfl:    letrozole (FEMARA) 2.5 MG tablet, Take 1 tablet (2.5 mg total) by mouth daily., Disp: 90 tablet, Rfl: 3   lisinopril (ZESTRIL) 10 MG tablet, Take 1 tablet (10 mg total) by mouth daily., Disp: 90 tablet, Rfl: 1   metFORMIN (GLUCOPHAGE-XR) 500 MG 24 hr tablet, Take 1 tablet (500 mg total) by mouth 2 (two) times daily with a meal., Disp: 180 tablet, Rfl: 1   Multiple Vitamin (MULTIVITAMIN) tablet, Take 1 tablet by mouth daily., Disp: , Rfl:    omeprazole (PRILOSEC) 20 MG capsule, Take 1 capsule (20 mg total) by mouth daily., Disp: 30 capsule, Rfl: 1   ondansetron (ZOFRAN) 4 MG tablet, Take 1 tablet (4 mg total) by mouth every 8 (  eight) hours as needed for nausea or vomiting., Disp: 90 tablet, Rfl: 0   oxyCODONE-acetaminophen (PERCOCET/ROXICET) 5-325 MG tablet, Take 1 tablet by mouth every 6 (six) hours as needed for severe pain., Disp: 30 tablet, Rfl: 0   palbociclib (IBRANCE) 75 MG tablet, Take 1 tablet (75 mg total) by mouth daily. Take for 21 days on, 7 days off, repeat every 28 days., Disp: 21 tablet, Rfl: 2   Zoster Vaccine Adjuvanted [facility]) injection, Inject into the muscle., Disp: 0.5 mL, Rfl: 1  Physical exam:  Vitals:   02/04/23 1316  BP: (!) 141/78  Pulse: 78  Resp: 18  Temp: 98.4 F (36.9 C)  TempSrc: Tympanic  SpO2: 100%  Weight: 272 lb 1.6 oz (123.4 kg)  Height: 5\' 3"  (1.6 m)   Physical Exam Cardiovascular:     Rate and Rhythm: Normal rate and regular rhythm.     Heart sounds: Normal heart sounds.   Pulmonary:     Effort: Pulmonary effort is normal.     Breath sounds: Normal breath sounds.  Abdominal:     General: Bowel sounds are normal.     Palpations: Abdomen is soft.  Skin:    General: Skin is warm and dry.  Neurological:     Mental Status: She is alert and oriented to person, place, and time.         Latest Ref Rng & Units 02/04/2023   12:58 PM  CMP  Glucose 70 - 99 mg/dL 578   BUN 6 - 20 mg/dL 20   Creatinine 4.69 - 1.00 mg/dL 6.29   Sodium 528 - 413 mmol/L 136   Potassium 3.5 - 5.1 mmol/L 3.9   Chloride 98 - 111 mmol/L 103   CO2 22 - 32 mmol/L 22   Calcium 8.9 - 10.3 mg/dL 9.0   Total Protein 6.5 - 8.1 g/dL 7.3   Total Bilirubin 0.3 - 1.2 mg/dL 0.4   Alkaline Phos 38 - 126 U/L 106   AST 15 - 41 U/L 24   ALT 0 - 44 U/L 25       Latest Ref Rng & Units 02/04/2023   12:58 PM  CBC  WBC 4.0 - 10.5 K/uL 3.8   Hemoglobin 12.0 - 15.0 g/dL 24.4   Hematocrit 01.0 - 46.0 % 35.6   Platelets 150 - 400 K/uL 180     Assessment and plan- Patient is a 62 y.o. female with metastatic ER positive breast cancer with bone metastases.  She is here for routine follow-up of breast cancer on letrozole plus Ibrance  Patient is currently on her lowest dose of Ibrance at 75 mg 3 weeks on and 1 week off along with letrozole.  She has mild leukopenia but ANC remains more than 1.  Hemoglobin and platelets have remained stable.  Tumor markers from today are pending.  She has repeat scans scheduled for October 2024 but based on her tumor markers we will see if that needs to be moved up.  Bone metastases: Patient has been on Zometa now for close to 2-1/2 years.  So far she has had stable areas of bone metastases.  I plan to give her Zometa today and following that give her a break from Zometa.  Both ASCO and European cancer Society guidelines have demonstrated potential increased side effects from Zometa after year 2 and the incremental benefit goes down if the bone metastases have remained  stable.  I will see her back in October with scans prior  Visit Diagnosis 1. Primary malignant neoplasm of breast with metastasis (HCC)   2. High risk medication use   3. Use of letrozole (Femara)   4. Encounter for monitoring zoledronic acid therapy      Dr. Owens Shark, MD, MPH [facility] [phone] 02/06/2023 11:47 AM

## 2023-02-07 ENCOUNTER — Encounter: Payer: Self-pay | Admitting: *Deleted

## 2023-02-07 ENCOUNTER — Other Ambulatory Visit: Payer: Self-pay | Admitting: *Deleted

## 2023-02-07 ENCOUNTER — Encounter: Payer: Self-pay | Admitting: Oncology

## 2023-02-07 ENCOUNTER — Ambulatory Visit: Payer: 59

## 2023-02-07 DIAGNOSIS — K7460 Unspecified cirrhosis of liver: Secondary | ICD-10-CM

## 2023-02-07 DIAGNOSIS — I4892 Unspecified atrial flutter: Secondary | ICD-10-CM

## 2023-02-07 DIAGNOSIS — C50919 Malignant neoplasm of unspecified site of unspecified female breast: Secondary | ICD-10-CM

## 2023-02-07 DIAGNOSIS — R978 Other abnormal tumor markers: Secondary | ICD-10-CM

## 2023-02-07 NOTE — Telephone Encounter (Signed)
Cordelia Pen - did you tell her we are moving up her scans?

## 2023-02-08 ENCOUNTER — Other Ambulatory Visit: Payer: Self-pay | Admitting: *Deleted

## 2023-02-08 ENCOUNTER — Other Ambulatory Visit: Payer: Self-pay | Admitting: Oncology

## 2023-02-08 ENCOUNTER — Other Ambulatory Visit (HOSPITAL_COMMUNITY): Payer: Self-pay

## 2023-02-08 ENCOUNTER — Other Ambulatory Visit: Payer: Self-pay | Admitting: Gastroenterology

## 2023-02-08 DIAGNOSIS — M5451 Vertebrogenic low back pain: Secondary | ICD-10-CM | POA: Diagnosis not present

## 2023-02-08 DIAGNOSIS — M25552 Pain in left hip: Secondary | ICD-10-CM | POA: Diagnosis not present

## 2023-02-08 DIAGNOSIS — M542 Cervicalgia: Secondary | ICD-10-CM | POA: Diagnosis not present

## 2023-02-08 DIAGNOSIS — M5030 Other cervical disc degeneration, unspecified cervical region: Secondary | ICD-10-CM | POA: Diagnosis not present

## 2023-02-08 DIAGNOSIS — M9905 Segmental and somatic dysfunction of pelvic region: Secondary | ICD-10-CM | POA: Diagnosis not present

## 2023-02-08 DIAGNOSIS — R519 Headache, unspecified: Secondary | ICD-10-CM | POA: Diagnosis not present

## 2023-02-08 DIAGNOSIS — M9901 Segmental and somatic dysfunction of cervical region: Secondary | ICD-10-CM | POA: Diagnosis not present

## 2023-02-08 DIAGNOSIS — M9903 Segmental and somatic dysfunction of lumbar region: Secondary | ICD-10-CM | POA: Diagnosis not present

## 2023-02-08 DIAGNOSIS — M9904 Segmental and somatic dysfunction of sacral region: Secondary | ICD-10-CM | POA: Diagnosis not present

## 2023-02-08 DIAGNOSIS — M7918 Myalgia, other site: Secondary | ICD-10-CM | POA: Diagnosis not present

## 2023-02-08 MED ORDER — OMEPRAZOLE 20 MG PO CPDR
20.0000 mg | DELAYED_RELEASE_CAPSULE | Freq: Every day | ORAL | 1 refills | Status: DC
  Filled 2023-02-08 – 2023-02-09 (×2): qty 30, 30d supply, fill #0

## 2023-02-08 MED ORDER — OXYCODONE-ACETAMINOPHEN 5-325 MG PO TABS: 1.0000 | ORAL_TABLET | Freq: Four times a day (QID) | ORAL | 0 refills | Status: DC | PRN

## 2023-02-08 NOTE — Telephone Encounter (Signed)
Refill sent.

## 2023-02-08 NOTE — Telephone Encounter (Signed)
Patient is telling the pharmacy that she is taking the omeprazole twice a day. But back 02/16/2022 you told her to just go to taking it once  a day. Please advise   Last office visit 11/24/2021 Has appointment 03/09/2023

## 2023-02-09 ENCOUNTER — Other Ambulatory Visit: Payer: Self-pay

## 2023-02-11 ENCOUNTER — Ambulatory Visit
Admission: RE | Admit: 2023-02-11 | Discharge: 2023-02-11 | Disposition: A | Discharge status: Home / Self Care | Payer: 59 | Source: Ambulatory Visit | Attending: Oncology | Admitting: Oncology

## 2023-02-11 ENCOUNTER — Encounter
Admission: RE | Admit: 2023-02-11 | Discharge: 2023-02-11 | Disposition: A | Discharge status: Home / Self Care | Payer: 59 | Source: Ambulatory Visit | Attending: Oncology | Admitting: Oncology

## 2023-02-11 DIAGNOSIS — K760 Fatty (change of) liver, not elsewhere classified: Secondary | ICD-10-CM | POA: Diagnosis not present

## 2023-02-11 DIAGNOSIS — C7951 Secondary malignant neoplasm of bone: Secondary | ICD-10-CM | POA: Diagnosis not present

## 2023-02-11 DIAGNOSIS — R978 Other abnormal tumor markers: Secondary | ICD-10-CM

## 2023-02-11 DIAGNOSIS — K5730 Diverticulosis of large intestine without perforation or abscess without bleeding: Secondary | ICD-10-CM | POA: Diagnosis not present

## 2023-02-11 DIAGNOSIS — C50919 Malignant neoplasm of unspecified site of unspecified female breast: Secondary | ICD-10-CM

## 2023-02-11 MED ORDER — TECHNETIUM TC 99M MEDRONATE IV KIT
20.0000 | PACK | Freq: Once | INTRAVENOUS | Status: AC | PRN
  Administered 2023-02-11: 21.87 via INTRAVENOUS

## 2023-02-11 MED ORDER — IOHEXOL 300 MG/ML  SOLN
100.0000 mL | Freq: Once | INTRAMUSCULAR | Status: AC | PRN
  Administered 2023-02-11: 100 mL via INTRAVENOUS

## 2023-02-15 ENCOUNTER — Encounter: Payer: Self-pay | Admitting: Oncology

## 2023-02-17 ENCOUNTER — Other Ambulatory Visit (HOSPITAL_COMMUNITY): Payer: Self-pay

## 2023-02-18 ENCOUNTER — Other Ambulatory Visit: Payer: 59

## 2023-02-22 ENCOUNTER — Encounter: Payer: Self-pay | Admitting: Oncology

## 2023-02-25 ENCOUNTER — Telehealth: Payer: Self-pay

## 2023-02-25 ENCOUNTER — Other Ambulatory Visit: Payer: Self-pay

## 2023-02-25 ENCOUNTER — Inpatient Hospital Stay: Payer: 59

## 2023-02-25 ENCOUNTER — Other Ambulatory Visit: Payer: 59

## 2023-02-25 ENCOUNTER — Encounter: Payer: Self-pay | Admitting: Oncology

## 2023-02-25 ENCOUNTER — Other Ambulatory Visit (HOSPITAL_COMMUNITY): Payer: Self-pay

## 2023-02-25 ENCOUNTER — Encounter: Payer: Self-pay | Admitting: *Deleted

## 2023-02-25 ENCOUNTER — Inpatient Hospital Stay: Payer: 59 | Attending: Oncology | Admitting: Oncology

## 2023-02-25 VITALS — BP 111/79 | HR 74 | Temp 98.1°F | Resp 18 | Ht 63.0 in | Wt 266.4 lb

## 2023-02-25 DIAGNOSIS — Z170 Estrogen receptor positive status [ER+]: Secondary | ICD-10-CM | POA: Insufficient documentation

## 2023-02-25 DIAGNOSIS — C773 Secondary and unspecified malignant neoplasm of axilla and upper limb lymph nodes: Secondary | ICD-10-CM | POA: Diagnosis not present

## 2023-02-25 DIAGNOSIS — C7951 Secondary malignant neoplasm of bone: Secondary | ICD-10-CM | POA: Insufficient documentation

## 2023-02-25 DIAGNOSIS — Z7189 Other specified counseling: Secondary | ICD-10-CM | POA: Diagnosis not present

## 2023-02-25 DIAGNOSIS — C50919 Malignant neoplasm of unspecified site of unspecified female breast: Secondary | ICD-10-CM

## 2023-02-25 DIAGNOSIS — C50911 Malignant neoplasm of unspecified site of right female breast: Secondary | ICD-10-CM | POA: Insufficient documentation

## 2023-02-25 DIAGNOSIS — Z79811 Long term (current) use of aromatase inhibitors: Secondary | ICD-10-CM | POA: Insufficient documentation

## 2023-02-25 DIAGNOSIS — G893 Neoplasm related pain (acute) (chronic): Secondary | ICD-10-CM

## 2023-02-25 MED ORDER — ONDANSETRON HCL 8 MG PO TABS
8.0000 mg | ORAL_TABLET | Freq: Three times a day (TID) | ORAL | 1 refills | Status: DC | PRN
Start: 2023-02-25 — End: 2024-05-26
  Filled 2023-02-25: qty 30, 10d supply, fill #0

## 2023-02-25 MED ORDER — LOPERAMIDE HCL 2 MG PO CAPS
ORAL_CAPSULE | ORAL | 0 refills | Status: DC
Start: 2023-02-25 — End: 2024-05-26
  Filled 2023-02-25: qty 30, 4d supply, fill #0

## 2023-02-25 MED ORDER — ABEMACICLIB 150 MG PO TABS
150.0000 mg | ORAL_TABLET | Freq: Two times a day (BID) | ORAL | Status: DC
Start: 2023-02-25 — End: 2023-02-28

## 2023-02-25 MED ORDER — PROCHLORPERAZINE MALEATE 10 MG PO TABS
10.0000 mg | ORAL_TABLET | Freq: Four times a day (QID) | ORAL | 1 refills | Status: DC | PRN
Start: 2023-02-25 — End: 2024-05-26
  Filled 2023-02-25: qty 30, 8d supply, fill #0

## 2023-02-25 NOTE — Telephone Encounter (Signed)
Oral Oncology Patient Advocate Encounter  New authorization   Received notification that prior authorization for Verzenio is required.   PA submitted on 02/25/23  Key [id number]  Status is pending     Ardeen Fillers, CPhT Oncology Pharmacy Patient Advocate  [facility]  [phone] (phone) [phone] (fax) 02/25/2023 1:48 PM

## 2023-02-25 NOTE — Telephone Encounter (Signed)
Oral Oncology Pharmacist Encounter  Received new prescription for Verzenio (abemaciclib) for the treatment of metastatic ER positive, HER2 negative breast cancer in conjunction with fulvestrant, planned duration until disease progression or unacceptable toxicity.  Labs from 02/04/23 (CBC and CMP) assessed, no interventions needed. Prescription dose and frequency assessed for appropriateness.   Current medication list in Epic reviewed, DDIs with Verzenio identified: - metformin: verzenio may increase the concentration of metformin. Monitor for increased effects of metformin.  - diltiazem: diltiazem may increase the concentration of verzenio. Monitor for increased toxicity/ side effects of verzenio and will need to decrease by 50mg  if increased toxicity occurs.   Evaluated chart and no patient barriers to medication adherence noted.   Patient agreement for treatment documented in MD note on 02/25/23.  Prescription has been e-scribed to the [facility] for benefits analysis and approval.  Oral Oncology Clinic will continue to follow for insurance authorization, copayment issues, initial counseling and start date.  Bethel Born, PharmD Hematology/Oncology Clinical Pharmacist [facility] [phone] 02/25/2023 1:28 PM

## 2023-02-25 NOTE — Progress Notes (Signed)
Per Dr. Smith Jacqueline Deleon, pt needs blood collected for Tempus xF+ liquid biopsy. Blood sample collected and ready for [employer] pickup. Order placed via online portal. Financial assistance application completed and faxed.

## 2023-02-25 NOTE — Progress Notes (Signed)
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  It was a 1.4 cm tumor with negative lymph nodes grade 1 and patient went on to get lumpectomy followed by adjuvant radiation therapy.  Oncotype DX score was 6 and she did not require adjuvant chemotherapy.  She was on tamoxifen for 5 years.  She had BRCA testing done at that time which was negative.   Patient felt a lump in her right axilla in September 2021.  She underwent ultrasound of bilateral breasts which did not pick up any axillary mass.  A prior screening mammogram in September 2021 was unremarkable.  She was then admitted for Covid pneumonia and underwent CT angio chest on 07/22/2020 which incidentally picked up an axillary mass measuring 2.5 cm.  No obvious breast mass.  Axillary mass was biopsied and was consistent with high-grade invasive mammary carcinoma.  IHC was positive for GATA3 with diffuse strong nuclear staining.  There was no lymph node architecture identified in the sample and it could not be determined if it is a primary tumor within the axillary breast tissue or metastases.  ER strongly positive greater than 90%, PR 1 to 10% positive and HER-2 negative   PET CT scan showed hypermetabolic poorly marginated solid right axillary mass 2.5 x 2 cm with an SUV of 5.6.  No enlarged hypermetabolic mediastinal or hilar lymph nodes no enlarged left axillary  lymph nodes.  Subcentimeter lung nodules below PET resolution.  Small hypermetabolism in the right liver with an SUV of 6 without CT correlate equivocal for liver metastases.  Multiple hypermetabolic faintly lytic osseous lesions throughout the lumbar spine and bilateral pelvic girdle with representative supra-acetabular right iliac bone 1.9 cm lesion, anterior superior left acetabular 1.7 cm lesion and L1 1.2 cm lesion.   NGS testing showed no actionable mutations.  PD-L1 less than 1%.  CHEK2 S422 FS, PIK3CA N1068 FS, PIK3CA N345H, CCN E1 gain, ERB B2 1 P-CSF 1R fusion, FGF 19 gain, FGF 3 gain, FGF 4gain.   Ibrance plus letrozole started in February 2022.   Patient noted to have increase in CA 27-29 from 26 and November 2023 203 in July 2024.  CT chest abdomen pelvis with contrast and bone scan showed worsening metastatic disease in the bones especially in the thoracolumbar spine and right hemipelvis.  Plan is therefore to switch her to Verzenio plus fulvestrant  Interval history-patient complains of low back pain radiating down to her lower abdomen.  She has also been feeling more fatigued of late.  ECOG PS- 1 Pain scale- 4 Opioid associated constipation- no  Review of systems- Review of Systems  Constitutional:  Positive for malaise/fatigue.  Musculoskeletal:  Positive for back pain.      Allergies  Allergen Reactions   Kiwi Extract Itching and Swelling    The real kiwi fruit   Codeine Other (See Comments)    Felt funny.   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   Chicken pox    Depression    Fatty liver    Fatty liver    Gastric polyps    GERD (gastroesophageal reflux disease)    Gestational diabetes    prediabetes out side of having baby   Headache    migraines   Heart murmur    History of abnormal mammogram 08/2006, 01/2008, 03/2009   Hypertension    IBS (irritable bowel syndrome)    Joint disorder    hx of left ankle tendonitis, bursitis, heel spur   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 Smith/ laprascopic surgery   COLONOSCOPY  04/04/2000   COLONOSCOPY WITH PROPOFOL N/A 02/12/2019   Procedure: COLONOSCOPY WITH PROPOFOL;  Surgeon: Christena Deem, MD;  Location: [facility] ENDOSCOPY;  Service: Endoscopy;  Laterality: N/A;   ESOPHAGOGASTRODUODENOSCOPY (EGD) WITH PROPOFOL N/A 05/01/2015   Procedure: ESOPHAGOGASTRODUODENOSCOPY (EGD) WITH PROPOFOL;  Surgeon: Wallace Cullens, MD;  Location: [facility] ENDOSCOPY;  Service: Gastroenterology;  Laterality: N/A;   ESOPHAGOGASTRODUODENOSCOPY (EGD) WITH PROPOFOL N/A 02/12/2019   Procedure: ESOPHAGOGASTRODUODENOSCOPY (EGD) WITH PROPOFOL;  Surgeon: Christena Deem, MD;  Location: [facility] ENDOSCOPY;  Service: Endoscopy;  Laterality: N/A;   EYE SURGERY  08/2012   laser surgery on retina/  DR Appenzeller   FINGER SURGERY     repair of tendon in right index, Dr. Benay Pillow in [facility]   FOOT SURGERY     Dr. Al Corpus   IRRIGATION AND DEBRIDEMENT SEBACEOUS CYST     right upper back   LAPAROSCOPIC SUPRACERVICAL HYSTERECTOMY  06/2007   Dr Kincius/ adhesions/CPP/adenomyosis   MASTECTOMY PARTIAL / LUMPECTOMY Right 01/2008   with sentinal lymph node biopsy/ infiltrating ductal cancer   REPAIR PERONEAL TENDONS ANKLE  10/2019   with tarsal exostectomy and sural neuroma excision on right    SKIN CANCER EXCISION     back and calves   WISDOM TOOTH EXTRACTION  1991    Social History   Socioeconomic History   Marital status: Married    Spouse name: Brett Canales   Number of children: 1   Years of education: 14   Highest education  level: Not on file  Occupational History   Occupation: CMA  Tobacco Use   Smoking status: Never   Smokeless tobacco: Never  Vaping Use   Vaping status: Never Used  Substance and Sexual Activity   Alcohol use: Yes    Alcohol/week: 0.0 standard drinks of alcohol    Comment: rare, 1-2 drinks per year   Drug use: No   Sexual activity: Yes    Partners: Male    Birth control/protection: Surgical    Comment: Hysterectomy   Other Topics Concern   Not on file  Social History Narrative   Lives in [place] with husband, no pets.  Works at [employer].      Diet - regular   Exercise - occasional   Social Determinants of Health   Financial Resource Strain: Low Risk  (11/23/2022)   Received from [facility], [facility]   Overall Financial Resource Strain (CARDIA)    Difficulty of Paying Living Expenses: Not hard at all  Food Insecurity: No Food Insecurity (11/23/2022)   Received from [facility], [facility]   Hunger Vital Sign    Worried About Running Out of Food in the Last Year: Never true    Ran Out of Food in the Last Year: Never true  Transportation Needs: No Transportation Needs  (11/23/2022)   Received from [facility], [facility]   [facility] - Transportation    In the past 12 months, has lack of transportation kept you from medical appointments or from getting medications?: No    Lack of Transportation (Non-Medical): No  Physical Activity: Insufficiently Active (07/27/2017)   Exercise Vital Sign    Days of Exercise per Week: 4 days    Minutes of Exercise per Session: 30 min  Stress: No Stress Concern Present (07/27/2017)   [employer] of Occupational Health - Occupational Stress Questionnaire    Feeling of Stress : Only a little  Social Connections: Socially Integrated (07/27/2017)   Social Connection and Isolation Panel [NHANES]    Frequency of Communication with Friends and Family: More than three times a week    Frequency of Social Gatherings with Friends and Family: Once a week    Attends Religious Services: More than 4 times per year    Active Member of [employer] or Organizations: Yes    Attends Banker Meetings: More than 4 times per year    Marital Status: Married  Catering manager Violence: Not At Risk (07/27/2017)   Humiliation, Afraid, Rape, and Kick questionnaire    Fear of Current or Ex-Partner: No    Emotionally Abused: No    Physically Abused: No    Sexually Abused: No    Family History  Problem Relation Age of Onset   Hypertension Mother    Thyroid disease Mother    Breast cancer Mother 82   Colon cancer Mother 68       again at 19   Atrial fibrillation Mother    Hip fracture Mother    Skin cancer Mother    Stroke Father    Hypertension Father    Cancer Father 72       prostate   Parkinson's disease Father    Skin cancer Father    Hypertension Sister    Thyroid disease Sister    Cancer Sister        skin/ squamous cell   Diabetes Maternal Grandmother    Stroke Maternal Grandfather  Heart disease Paternal Grandfather    Diabetes Maternal Aunt    Cancer Maternal Aunt    Breast  cancer Maternal Aunt 75   Lymphoma Maternal Uncle    Heart disease Maternal Uncle    Lung cancer Paternal Aunt 63       smoker   Thyroid cancer Cousin        maternal   Breast cancer Cousin 71       maternal   Breast cancer Cousin 66   Colon cancer Cousin      Current Outpatient Medications:    aspirin EC 81 MG tablet, Take by mouth., Disp: , Rfl:    b complex vitamins capsule, Take 1 capsule by mouth daily., Disp: , Rfl:    buPROPion (WELLBUTRIN XL) 150 MG 24 hr tablet, Take 1 tablet (150 mg total) by mouth daily. Take along a 300mg  tablet for a total daily dose of 450mg ., Disp: 90 tablet, Rfl: 3   buPROPion (WELLBUTRIN XL) 300 MG 24 hr tablet, Take 1 tablet (300 mg total) by mouth daily., Disp: 90 tablet, Rfl: 3   butalbital-acetaminophen-caffeine (FIORICET) 50-325-40 MG tablet, Take 1 tablet by mouth every 6 (six) hours as needed for headache., Disp: 30 tablet, Rfl: 0   calcium carbonate (OSCAL) 1500 (600 Ca) MG TABS tablet, Take by mouth 2 (two) times daily with a meal., Disp: , Rfl:    Cholecalciferol (VITAMIN D3) 10 MCG (400 UNIT) CAPS, Take by mouth., Disp: , Rfl:    citalopram (CELEXA) 40 MG tablet, Take 1 tablet (40 mg total) by mouth daily., Disp: 90 tablet, Rfl: 3   diltiazem (CARDIZEM CD) 120 MG 24 hr capsule, Take 1 capsule by mouth daily. *Appt needed for future refills*, Disp: 90 capsule, Rfl: 0   diphenhydrAMINE (BENADRYL) 25 MG tablet, Take 25 mg by mouth at bedtime as needed for sleep. Pt states daily now for sleep and allergies., Disp: , Rfl:    gabapentin (NEURONTIN) 100 MG capsule, Take 1 capsule (100 mg total) by mouth every morning AND 2 capsules (200 mg total) every evening., Disp: 270 capsule, Rfl: 1   ketotifen (ZADITOR) 0.025 % ophthalmic solution, Place 1 drop into both eyes daily., Disp: , Rfl:    letrozole (FEMARA) 2.5 MG tablet, Take 1 tablet (2.5 mg total) by mouth daily., Disp: 90 tablet, Rfl: 3   lisinopril (ZESTRIL) 10 MG tablet, Take 1 tablet (10 mg  total) by mouth daily., Disp: 90 tablet, Rfl: 1   metFORMIN (GLUCOPHAGE-XR) 500 MG 24 hr tablet, Take 1 tablet (500 mg total) by mouth 2 (two) times daily with a meal., Disp: 180 tablet, Rfl: 1   Multiple Vitamin (MULTIVITAMIN) tablet, Take 1 tablet by mouth daily., Disp: , Rfl:    ondansetron (ZOFRAN) 4 MG tablet, Take 1 tablet (4 mg total) by mouth every 8 (eight) hours as needed for nausea or vomiting., Disp: 90 tablet, Rfl: 0   oxyCODONE-acetaminophen (PERCOCET/ROXICET) 5-325 MG tablet, Take 1 tablet by mouth every 6 (six) hours as needed for severe pain., Disp: 30 tablet, Rfl: 0   palbociclib (IBRANCE) 75 MG tablet, Take 1 tablet (75 mg total) by mouth daily. Take for 21 days on, 7 days off, repeat every 28 days., Disp: 21 tablet, Rfl: 2   Zoster Vaccine Adjuvanted [facility]) injection, Inject into the muscle. (Patient not taking: Reported on 02/25/2023), Disp: 0.5 mL, Rfl: 1  Physical exam:  Vitals:   02/25/23 1015  BP: 111/79  Pulse: 74  Resp: 18  Temp:  98.1 F (36.7 C)  TempSrc: Tympanic  SpO2: 99%  Weight: 266 lb 6.4 oz (120.8 kg)  Height: 5\' 3"  (1.6 m)   Physical Exam Cardiovascular:     Rate and Rhythm: Normal rate and regular rhythm.     Heart sounds: Normal heart sounds.  Pulmonary:     Effort: Pulmonary effort is normal.     Breath sounds: Normal breath sounds.  Abdominal:     General: Bowel sounds are normal.     Palpations: Abdomen is soft.  Skin:    General: Skin is warm and dry.  Neurological:     Mental Status: She is alert and oriented to person, place, and time.         Latest Ref Rng & Units 02/04/2023   12:58 PM  CMP  Glucose 70 - 99 mg/dL 161   BUN 6 - 20 mg/dL 20   Creatinine 0.96 - 1.00 mg/dL 0.45   Sodium 409 - 811 mmol/L 136   Potassium 3.5 - 5.1 mmol/L 3.9   Chloride 98 - 111 mmol/L 103   CO2 22 - 32 mmol/L 22   Calcium 8.9 - 10.3 mg/dL 9.0   Total Protein 6.5 - 8.1 g/dL 7.3   Total Bilirubin 0.3 - 1.2 mg/dL 0.4   Alkaline Phos 38 - 126  U/L 106   AST 15 - 41 U/L 24   ALT 0 - 44 U/L 25       Latest Ref Rng & Units 02/04/2023   12:58 PM  CBC  WBC 4.0 - 10.5 K/uL 3.8   Hemoglobin 12.0 - 15.0 g/dL 91.4   Hematocrit 78.2 - 46.0 % 35.6   Platelets 150 - 400 K/uL 180     No images are attached to the encounter.  NM Bone Scan Whole Body  Result Date: 02/17/2023 CLINICAL DATA:  Metastatic breast cancer, osseous metastases EXAM: NUCLEAR MEDICINE WHOLE BODY BONE SCAN TECHNIQUE: Whole body anterior and posterior images were obtained approximately 3 hours after intravenous injection of radiopharmaceutical. RADIOPHARMACEUTICALS:  21.87 mCi Technetium-75m MDP IV COMPARISON:  11/01/2022, 04/29/2022 FINDINGS: There is intense focal uptake of radiotracer again identified within the left T6 and L1 vertebral bodies as well as the right acetabulum in keeping with known fossae of osseous metastatic disease. However, in the interval, there has developed deposition of radiotracer within the right T12, right L5 and S1, and right posterior iliac spine in keeping with progressive osseous metastatic disease. Uptake within the shoulders, knees, and ankles bilaterally is again noted, likely degenerative in nature. Normal soft tissue distribution. Normal uptake and excretion within the kidneys and bladder. IMPRESSION: Progressive osseous metastatic disease with development of new lesions within the thoracolumbar spine and right hemipelvis as described above. These are not well appreciated on concurrently performed CT examination of the chest, abdomen, and pelvis and may be confirmed with dedicated MRI examination of the lumbar spine and pelvis if indicated. Alternatively, these may be reassessed on subsequent restaging examination. Electronically Signed   By: Helyn Numbers M.D.   On: 02/17/2023 22:03   CT CHEST ABDOMEN PELVIS W CONTRAST  Result Date: 02/14/2023 CLINICAL DATA:  Metastatic breast cancer restaging, elevated tumor markers * Tracking Code: BO *  EXAM: CT CHEST, ABDOMEN, AND PELVIS WITH CONTRAST TECHNIQUE: Multidetector CT imaging of the chest, abdomen and pelvis was performed following the standard protocol during bolus administration of intravenous contrast. RADIATION DOSE REDUCTION: This exam was performed according to the departmental dose-optimization program which includes automated exposure control,  adjustment of the mA and/or kV according to patient size and/or use of iterative reconstruction technique. CONTRAST:  OMNIPAQUE IOHEXOL 300 MG/ML  SOLN COMPARISON:  11/01/2022 FINDINGS: CT CHEST FINDINGS Cardiovascular: No significant vascular findings. Normal heart size. No pericardial effusion. Mediastinum/Nodes: No enlarged mediastinal, hilar, or axillary lymph nodes. Thyroid gland, trachea, and esophagus demonstrate no significant findings. Lungs/Pleura: Lungs are clear. No pleural effusion or pneumothorax. Musculoskeletal: Unchanged postoperative appearance of the right breast. No acute osseous findings. CT ABDOMEN PELVIS FINDINGS Hepatobiliary: No focal liver abnormality is seen. Hepatic steatosis. Status post cholecystectomy. No biliary dilatation. Pancreas: Unremarkable. No pancreatic ductal dilatation or surrounding inflammatory changes. Spleen: Normal in size without significant abnormality. Adrenals/Urinary Tract: Adrenal glands are unremarkable. 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 Kidneys are otherwise normal, without renal calculi, suspicious solid lesion, or hydronephrosis. Bladder is unremarkable. Stomach/Bowel: Stomach is within normal limits. Appendix not clearly visualized. No evidence of bowel wall thickening, distention, or inflammatory changes. Sigmoid diverticulosis. Vascular/Lymphatic: No significant vascular findings are present. No enlarged abdominal or pelvic lymph nodes.  Reproductive: No mass or other abnormality. Other: No abdominal wall hernia or abnormality. No ascites. Musculoskeletal: No acute osseous findings. Unchanged sclerotic osseous metastases scattered through the vertebral bodies and right acetabulum. IMPRESSION: 1. Unchanged sclerotic osseous metastases scattered through the vertebral bodies and right acetabulum. 2. No evidence of lymphadenopathy or soft tissue metastasis in the chest, abdomen, or pelvis. 3. Hepatic steatosis. 4. Sigmoid diverticulosis. Electronically Signed   By: Jearld Lesch M.D.   On: 02/14/2023 21:26   ECHOCARDIOGRAM COMPLETE  Result Date: 02/08/2023    ECHOCARDIOGRAM REPORT   Patient Name:   Shenea TAYLOR JEREZ Date of Exam: 02/07/2023 Medical Rec #:  [id number]      Height:       63.0 in Accession #:    [id number]     Weight:       272.1 lb Date of Birth:  03/05/1961      BSA:          2.204 m Patient Age:    62 years       BP:           124/78 mmHg Patient Gender: F              HR:           87 bpm. Exam Location:  [place] Procedure: 2D Echo, Cardiac Doppler, Color Doppler and Strain Analysis Indications:    I51.7 Cardiomegaly  History:        Patient has prior history of Echocardiogram examinations, most                 recent 08/08/2020. Cardiomegaly, Arrythmias:Atrial Fibrillation                 and Atrial Flutter, Signs/Symptoms:Dyspnea; Risk                 Factors:Hypertension and Sleep Apnea.  Sonographer:    Ilda Mori RDCS, MHA Referring Phys: [id number] Lanier Prude  Sonographer Comments: Technically difficult study due to poor echo windows and patient is obese. Image acquisition challenging due to patient body habitus. IMPRESSIONS  1. Left ventricular ejection fraction, by estimation, is 55 to 60%. The left ventricle has normal function. The left ventricle has no regional wall motion abnormalities. Left ventricular diastolic parameters are consistent with Grade I diastolic dysfunction (impaired  relaxation). The average left  ventricular global longitudinal strain is -20.3 %. The global longitudinal strain is normal.  2. Right ventricular systolic function is normal. The right ventricular size is normal.  3. The mitral valve is normal in structure. No evidence of mitral valve regurgitation.  4. The aortic valve was not well visualized. Aortic valve regurgitation is not visualized.  5. The inferior vena cava is normal in size with greater than 50% respiratory variability, suggesting right atrial pressure of 3 mmHg. FINDINGS  Left Ventricle: Left ventricular ejection fraction, by estimation, is 55 to 60%. The left ventricle has normal function. The left ventricle has no regional wall motion abnormalities. The average left ventricular global longitudinal strain is -20.3 %. The global longitudinal strain is normal. The left ventricular internal cavity size was normal in size. There is no left ventricular hypertrophy. Left ventricular diastolic parameters are consistent with Grade I diastolic dysfunction (impaired relaxation). Right Ventricle: The right ventricular size is normal. No increase in right ventricular wall thickness. Right ventricular systolic function is normal. Left Atrium: Left atrial size was normal in size. Right Atrium: Right atrial size was normal in size. Pericardium: There is no evidence of pericardial effusion. Mitral Valve: The mitral valve is normal in structure. No evidence of mitral valve regurgitation. Tricuspid Valve: The tricuspid valve is not well visualized. Tricuspid valve regurgitation is not demonstrated. Aortic Valve: The aortic valve was not well visualized. Aortic valve regurgitation is not visualized. Pulmonic Valve: The pulmonic valve was not well visualized. Pulmonic valve regurgitation is not visualized. Aorta: The aortic root is normal in size and structure. Venous: The inferior vena cava is normal in size with greater than 50% respiratory variability, suggesting right atrial pressure of 3 mmHg.  IAS/Shunts: No atrial level shunt detected by color flow Doppler.   Diastology LV e' medial:    10.20 cm/s LV E/e' medial:  8.6 LV e' lateral:   11.70 cm/s LV E/e' lateral: 7.5  2D Longitudinal Strain 2D Strain GLS Avg:     -20.3 % RIGHT VENTRICLE RV S prime:     17.10 cm/s TAPSE (M-mode): 3.1 cm LEFT ATRIUM             Index LA Vol (A2C):   29.5 ml 13.39 ml/m LA Vol (A4C):   29.5 ml 13.39 ml/m LA Biplane Vol: 30.6 ml 13.88 ml/m   AORTA Ao Root diam: 2.40 cm Ao Asc diam:  3.00 cm MITRAL VALVE MV Area (PHT): 4.06 cm MV Decel Time: 187 msec MV E velocity: 88.00 cm/s MV A velocity: 108.00 cm/s MV E/A ratio:  0.81 Debbe Odea MD Electronically signed by Debbe Odea MD Signature Date/Time: 02/08/2023/8:25:17 AM    Final      Assessment and plan- Patient is a 62 y.o. female with history of ER positive breast cancer with bone metastases here to discuss further management  Patient was diagnosed with metastatic breast cancerWith bone metastases back in February 2022.  At that time it was her right axillary nodal mass that was biopsied and proven to be breast cancer and NGS testing was done on that specimen.  PET scan showed bony metastatic disease and bone mets at that time were not biopsied and it was presumed that this is all metastatic breast cancer.  Since then she has been on letrozole plus Ibrance regimen with stable disease.  In July 2024 patient was noted to have elevated tumor markers and repeat scans show worsening areas of bone metastatic disease especially in the  thoracolumbar spine and right hemipelvis concerning for disease progression.  I have reviewed CT chest abdomen pelvis as well as bone scan images independently and discussed findings with the patient.  I would like to obtain peripheral blood NGS testing at this time to test for any acquired mutations such as ESR 1.  Ideally we would like to get tissue specimen but her only area of metastatic disease is bone metastases which is  difficult to obtain tissue diagnosis and run NGS testing.  I would like to get FES PET scan to a certain if her disease progression is still hormone driven or not.    If the SUV uptake on the FES PET is still more than 2 it is very likely that patient will continue to respond to endocrine therapy.  I plan this to switch her presently from letrozole Ibrance regimen to abemaciclib plus fulvestrant.  I would like to hold off on starting fulvestrant until she gets her FES PET scan done.  Recent data from post [facility] E study shows continued response history of metastatic breast cancer even in patients who received first-line CDK inhibitor such as Ibrance.  Discussed risks and benefits of a masitinib including all but not limited to nausea, vomiting, low blood counts, risk of infections and hospitalizations.  Treatment will be given with a palliative intent.  Patient understands and agrees to proceed as planned.  Patient will continue to stay presently on letrozole plus Ibrance until her abemaciclib gets approved.  After she gets her FES PET scan I will switch her to fulvestrant and until then she will remain on letrozole.    I will tentatively see her in about 2 weeks time to start fulvestrant   Visit Diagnosis 1. 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 Goals of care, counseling/discussion   3. Neoplasm related pain      Dr. Owens Shark, MD, MPH [facility] at [facility] [phone] 02/25/2023 12:49 PM

## 2023-02-25 NOTE — Telephone Encounter (Signed)
Oral Oncology Patient Advocate Encounter  Prior Authorization for Kathlen Mody has been approved.    PA# [id number]  Effective dates: 02/25/23 through 02/25/24  Patients co-pay is $0.00.    Ardeen Fillers, CPhT Oncology Pharmacy Patient Advocate  [facility]  [phone] (phone) [phone] (fax) 02/25/2023 1:51 PM

## 2023-02-25 NOTE — Progress Notes (Signed)
DISCONTINUE ON PATHWAY REGIMEN - Breast     A cycle is every 28 days:     Letrozole      Palbociclib   **Always confirm dose/schedule in your pharmacy ordering system**  REASON: Disease Progression PRIOR TREATMENT: [id number]: Palbociclib 125 mg D1-21 + Letrozole 2.5 mg D1-28 q28 Days TREATMENT RESPONSE: Progressive Disease (PD)  START ON PATHWAY REGIMEN - Breast     Cycle 1: A cycle is 28 days:     Abemaciclib      Fulvestrant    Cycles 2 and beyond: A cycle is every 28 days:     Abemaciclib      Fulvestrant   **Always confirm dose/schedule in your pharmacy ordering system**  Patient Characteristics: Distant Metastases or Locoregional Recurrent Disease - Unresected, M0 or Locally Advanced Unresectable Disease Progressing after Neoadjuvant and Local Therapies, M0, HER2 Low/Negative, ER Positive, Endocrine Therapy, Postmenopausal, Second Line,  Candidate for a CDK4/6 Inhibitor, Abemaciclib Preferred Therapeutic Status: Distant Metastases HER2 Status: Negative (-) ER Status: Positive (+) PR Status: Positive (+) Therapy Approach Indicated: Standard Chemotherapy/Endocrine Therapy Menopausal Status: Postmenopausal Line of Therapy: Second Line Intent of Therapy: Non-Curative / Palliative Intent, Discussed with Patient

## 2023-02-26 ENCOUNTER — Other Ambulatory Visit: Payer: Self-pay

## 2023-02-27 ENCOUNTER — Encounter: Payer: Self-pay | Admitting: Oncology

## 2023-02-28 ENCOUNTER — Telehealth: Payer: Self-pay | Admitting: Pharmacist

## 2023-02-28 ENCOUNTER — Encounter: Payer: Self-pay | Admitting: Pharmacist

## 2023-02-28 ENCOUNTER — Other Ambulatory Visit: Payer: Self-pay

## 2023-02-28 ENCOUNTER — Other Ambulatory Visit: Payer: Self-pay | Admitting: *Deleted

## 2023-02-28 ENCOUNTER — Other Ambulatory Visit (HOSPITAL_COMMUNITY): Payer: Self-pay

## 2023-02-28 DIAGNOSIS — C50919 Malignant neoplasm of unspecified site of unspecified female breast: Secondary | ICD-10-CM

## 2023-02-28 MED ORDER — ABEMACICLIB 150 MG PO TABS
150.0000 mg | ORAL_TABLET | Freq: Two times a day (BID) | ORAL | 0 refills | Status: DC
Start: 2023-02-28 — End: 2023-03-23
  Filled 2023-02-28 (×2): qty 56, 28d supply, fill #0

## 2023-02-28 NOTE — Telephone Encounter (Unsigned)
Clinical Pharmacist Practitioner Encounter   [facility] (Specialty) will deliver medication on 03/02/23. Patient will start once she has medication in hand.   Patient Education I spoke with patient for overview of new oral chemotherapy medication: Verzenio (abemaciclib) for the treatment of metastatic ER positive, HER2 negative breast cancer in conjunction with fulvestrant, planned duration until disease progression or unacceptable toxicity.   Patient will be transitioning from palbociclib to abemaciclib per [facility] study.   Lind Covert, et al: Abemaciclib plus fulvestrant vs fulvestrant alone for HR+, HER2- advanced breast cancer following progression on a prior CDK4/6 inhibitor plus endocrine therapy: Primary outcome of the phase 3 postMONARCH trial. 2024 ASCO Annual Meeting. Abstract [id number]. Presented December 11, 2022.  Counseled patient on administration, dosing, side effects, monitoring, drug-food interactions, safe handling, storage, and disposal. Patient will take 1 tablet (150 mg total) by mouth 2 (two) times daily. .  Side effects include but not limited to: diarrhea, nausea, fatigue, decreased wbc/hgb/plt.    Reviewed with patient importance of keeping a medication schedule and plan for any missed doses.  After discussion with patient no patient barriers to medication adherence identified.   Ms. Venditti voiced understanding and appreciation. All questions answered. Medication handout provided.  Provided patient with Oral Chemotherapy Navigation Clinic phone number. Patient knows to call the office with questions or concerns. Oral Chemotherapy Navigation Clinic will continue to follow.  Remi Haggard, PharmD, BCPS, BCOP, CPP Hematology/Oncology Clinical Pharmacist Practitioner [facility] [phone]  02/28/2023 3:02 PM

## 2023-02-28 NOTE — Progress Notes (Signed)
Erroneous encounter

## 2023-03-01 ENCOUNTER — Other Ambulatory Visit: Payer: Self-pay

## 2023-03-01 ENCOUNTER — Other Ambulatory Visit (HOSPITAL_COMMUNITY): Payer: Self-pay

## 2023-03-07 ENCOUNTER — Other Ambulatory Visit: Payer: Self-pay | Admitting: Oncology

## 2023-03-07 DIAGNOSIS — C50919 Malignant neoplasm of unspecified site of unspecified female breast: Secondary | ICD-10-CM | POA: Diagnosis not present

## 2023-03-08 ENCOUNTER — Encounter: Payer: Self-pay | Admitting: Oncology

## 2023-03-08 DIAGNOSIS — R519 Headache, unspecified: Secondary | ICD-10-CM | POA: Diagnosis not present

## 2023-03-08 DIAGNOSIS — M9903 Segmental and somatic dysfunction of lumbar region: Secondary | ICD-10-CM | POA: Diagnosis not present

## 2023-03-08 DIAGNOSIS — M9901 Segmental and somatic dysfunction of cervical region: Secondary | ICD-10-CM | POA: Diagnosis not present

## 2023-03-08 DIAGNOSIS — M542 Cervicalgia: Secondary | ICD-10-CM | POA: Diagnosis not present

## 2023-03-08 DIAGNOSIS — M5451 Vertebrogenic low back pain: Secondary | ICD-10-CM | POA: Diagnosis not present

## 2023-03-08 DIAGNOSIS — M9905 Segmental and somatic dysfunction of pelvic region: Secondary | ICD-10-CM | POA: Diagnosis not present

## 2023-03-08 DIAGNOSIS — M9904 Segmental and somatic dysfunction of sacral region: Secondary | ICD-10-CM | POA: Diagnosis not present

## 2023-03-08 DIAGNOSIS — M25552 Pain in left hip: Secondary | ICD-10-CM | POA: Diagnosis not present

## 2023-03-08 DIAGNOSIS — M7918 Myalgia, other site: Secondary | ICD-10-CM | POA: Diagnosis not present

## 2023-03-08 DIAGNOSIS — M5030 Other cervical disc degeneration, unspecified cervical region: Secondary | ICD-10-CM | POA: Diagnosis not present

## 2023-03-09 ENCOUNTER — Other Ambulatory Visit: Payer: Self-pay

## 2023-03-09 ENCOUNTER — Other Ambulatory Visit (HOSPITAL_COMMUNITY): Payer: Self-pay

## 2023-03-09 ENCOUNTER — Encounter: Payer: Self-pay | Admitting: Gastroenterology

## 2023-03-09 ENCOUNTER — Ambulatory Visit (INDEPENDENT_AMBULATORY_CARE_PROVIDER_SITE_OTHER): Payer: 59 | Admitting: Gastroenterology

## 2023-03-09 VITALS — BP 88/61 | HR 87 | Temp 98.1°F | Ht 63.0 in | Wt 268.0 lb

## 2023-03-09 DIAGNOSIS — K760 Fatty (change of) liver, not elsewhere classified: Secondary | ICD-10-CM

## 2023-03-09 DIAGNOSIS — K529 Noninfective gastroenteritis and colitis, unspecified: Secondary | ICD-10-CM

## 2023-03-09 DIAGNOSIS — R1013 Epigastric pain: Secondary | ICD-10-CM | POA: Diagnosis not present

## 2023-03-09 DIAGNOSIS — R110 Nausea: Secondary | ICD-10-CM | POA: Diagnosis not present

## 2023-03-09 DIAGNOSIS — R197 Diarrhea, unspecified: Secondary | ICD-10-CM

## 2023-03-09 MED ORDER — OMEPRAZOLE 20 MG PO CPDR
20.0000 mg | DELAYED_RELEASE_CAPSULE | Freq: Every day | ORAL | 3 refills | Status: DC
  Filled 2023-03-09: qty 90, 90d supply, fill #0
  Filled 2023-05-05 – 2023-06-01 (×2): qty 90, 90d supply, fill #1
  Filled 2023-08-30: qty 90, 90d supply, fill #2
  Filled 2023-11-28: qty 90, 90d supply, fill #3

## 2023-03-09 NOTE — Patient Instructions (Signed)
Gave samples of Ibguard if they help you can buy this over the counter.

## 2023-03-09 NOTE — Progress Notes (Signed)
Arlyss Repress, MD [address]  [address]  [place], [place] [address]  Main: [phone]  Fax: [phone]    Gastroenterology Consultation  Referring Provider:     Kandyce Rud, MD Primary Care Physician:  Kandyce Rud, MD Primary Gastroenterologist:  Dr. Arlyss Repress Reason for Consultation:     Chronic epigastric pain, intermittent loose stools        HPI:   Jacqueline Deleon is a 62 y.o. female referred by Dr. Kandyce Rud, MD  for consultation & management of chronic epigastric pain and intermittent loose stools.  Patient has history of ER positive metastatic stage IV breast cancer on hormonal therapy as well as chemotherapy.  Patient reports that she has been experiencing discomfort in the epigastric area radiating across the bilateral subcostal margin as well as sometimes to back.  She is taking omeprazole 20 mg p.o. twice daily and wondering if she should increase the dose.  She does acknowledge drinking a bottle of soda daily, she loves eating bread, pasta.  She has been gaining weight, gained significant amount of weight within last 1 to 2 years.  Patient also report intermittent loose stools without any bleeding, yesterday and today she has been having 1 formed bowel movement.  Patient works as a Clinical biochemist at [employer]  Follow-up visit 03/09/2023 Jacqueline Deleon is here for follow-up of chronic symptoms of upper abdominal discomfort associated with postprandial urgency, chronic nausea.  Her H. pylori breath test was negative in the past.  Patient admits to not eating healthy.  She eats cheese and crackers, drinks 0 sugar Gatorade, carbonated beverages in order to suppress nausea.  She does take Zofran which also helps to relieve nausea.  She also reports significant abdominal bloating.  She reports losing about 10 pounds within last 6 months, however lost only 4 pounds based on our scale since her last visit with me 1 year ago.  Her life is sedentary.  NSAIDs:  None  Antiplts/Anticoagulants/Anti thrombotics: None  GI Procedures:  EGD and colonoscopy 02/12/2019 - Z-line variable. Biopsied. - Multiple gastric polyps. Biopsied. - Erythematous mucosa in the gastric body. - Normal examined duodenum. - Biopsies were taken with a cold forceps for histology in the gastric body and in the gastric antrum. DIAGNOSIS:  A.  STOMACH, ANTRUM; COLD BIOPSY:  - CHRONIC AND FOCALLY ACTIVE GASTRITIS.  - NEGATIVE FOR H PYLORI.  - NEGATIVE FOR INTESTINAL METAPLASIA, DYSPLASIA, AND MALIGNANCY.   B.  STOMACH, BODY; COLD BIOPSY:  - CHRONIC GASTRITIS.  - NEGATIVE FOR ACTIVE INFLAMMATION, INTESTINAL METAPLASIA, DYSPLASIA,  AND MALIGNANCY.   C.  STOMACH POLYP; COLD BIOPSY:  - FRAGMENTS OF FUNDIC GLAND POLYP.  - NEGATIVE FOR DYSPLASIA AND MALIGNANCY.   D.  GE J; COLD BIOPSY:  - GASTRIC TYPE MUCOSA WITH CHRONIC INFLAMMATION.  - NEGATIVE FOR INTESTINAL METAPLASIA, DYSPLASIA, AND MALIGNANCY.   - Diverticulosis in the sigmoid colon and in the descending colon. - Non-bleeding external and internal hemorrhoids. - No specimens collected.  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   Chicken  pox    Depression    Fatty liver    Fatty liver    Gastric polyps    GERD (gastroesophageal reflux disease)    Gestational diabetes    prediabetes out side of having baby   Headache    migraines   Heart murmur    History of abnormal mammogram 08/2006, 01/2008, 03/2009   Hypertension    IBS (irritable bowel syndrome)    Joint disorder    hx of left ankle tendonitis, bursitis, heel spur   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 Smith/ laprascopic surgery   COLONOSCOPY  04/04/2000   COLONOSCOPY WITH PROPOFOL N/A 02/12/2019   Procedure: COLONOSCOPY WITH PROPOFOL;  Surgeon: Christena Deem, MD;  Location: [facility] ENDOSCOPY;  Service: Endoscopy;  Laterality: N/A;   ESOPHAGOGASTRODUODENOSCOPY (EGD) WITH PROPOFOL N/A 05/01/2015   Procedure: ESOPHAGOGASTRODUODENOSCOPY (EGD) WITH PROPOFOL;  Surgeon: Wallace Cullens, MD;  Location: [facility] ENDOSCOPY;  Service: Gastroenterology;  Laterality: N/A;   ESOPHAGOGASTRODUODENOSCOPY (EGD) WITH PROPOFOL N/A 02/12/2019   Procedure: ESOPHAGOGASTRODUODENOSCOPY (EGD) WITH PROPOFOL;  Surgeon: Christena Deem, MD;  Location: [facility] ENDOSCOPY;  Service: Endoscopy;  Laterality: N/A;   EYE SURGERY  08/2012   laser surgery on retina/ DR Appenzeller   FINGER SURGERY     repair of tendon in right index, Dr. Benay Pillow in [facility]   FOOT SURGERY     Dr. Al Corpus   IRRIGATION AND DEBRIDEMENT SEBACEOUS CYST     right upper back   LAPAROSCOPIC SUPRACERVICAL HYSTERECTOMY  06/2007   Dr Kincius/ adhesions/CPP/adenomyosis   MASTECTOMY PARTIAL / LUMPECTOMY Right 01/2008   with sentinal lymph node biopsy/ infiltrating ductal cancer   REPAIR PERONEAL TENDONS ANKLE  10/2019   with tarsal exostectomy and sural neuroma excision on right    SKIN CANCER EXCISION      back and calves   WISDOM TOOTH EXTRACTION  1991    Current Outpatient Medications:    aspirin EC 81 MG tablet, Take by mouth., Disp: , Rfl:    b complex vitamins capsule, Take 1 capsule by mouth daily., Disp: , Rfl:    buPROPion (WELLBUTRIN XL) 150 MG 24 hr tablet, Take 1 tablet (150 mg total) by mouth daily. Take along a 300mg  tablet for a total daily dose of 450mg ., Disp: 90 tablet, Rfl: 3   buPROPion (WELLBUTRIN XL) 300 MG 24 hr tablet, Take 1 tablet (300 mg total) by mouth daily., Disp: 90 tablet, Rfl: 3   butalbital-acetaminophen-caffeine (FIORICET) 50-325-40 MG tablet, Take 1 tablet by mouth every 6 (six) hours as needed for headache., Disp: 30 tablet, Rfl: 0   calcium carbonate (OSCAL) 1500 (600 Ca) MG TABS tablet, Take by mouth 2 (two) times daily with a meal., Disp: , Rfl:  Cholecalciferol (VITAMIN D3) 10 MCG (400 UNIT) CAPS, Take by mouth., Disp: , Rfl:    citalopram (CELEXA) 40 MG tablet, Take 1 tablet (40 mg total) by mouth daily., Disp: 90 tablet, Rfl: 3   diltiazem (CARDIZEM CD) 120 MG 24 hr capsule, Take 1 capsule by mouth daily. *Appt needed for future refills*, Disp: 90 capsule, Rfl: 0   diphenhydrAMINE (BENADRYL) 25 MG tablet, Take 25 mg by mouth at bedtime as needed for sleep. Pt states daily now for sleep and allergies., Disp: , Rfl:    gabapentin (NEURONTIN) 100 MG capsule, Take 1 capsule (100 mg total) by mouth every morning AND 2 capsules (200 mg total) every evening., Disp: 270 capsule, Rfl: 1   ketotifen (ZADITOR) 0.025 % ophthalmic solution, Place 1 drop into both eyes daily., Disp: , Rfl:    lisinopril (ZESTRIL) 10 MG tablet, Take 1 tablet (10 mg total) by mouth daily., Disp: 90 tablet, Rfl: 1   loperamide (IMODIUM) 2 MG capsule, Take 2 tabs by mouth with first loose stool, then 1 tab with each additional loose stool as needed. Do not exceed 8 tabs in a 24-hour period, Disp: 30 capsule, Rfl: 0   metFORMIN (GLUCOPHAGE-XR) 500 MG 24 hr tablet, Take 1 tablet (500 mg  total) by mouth 2 (two) times daily with a meal., Disp: 180 tablet, Rfl: 1   Multiple Vitamin (MULTIVITAMIN) tablet, Take 1 tablet by mouth daily., Disp: , Rfl:    ondansetron (ZOFRAN) 4 MG tablet, Take 1 tablet (4 mg total) by mouth every 8 (eight) hours as needed for nausea or vomiting., Disp: 90 tablet, Rfl: 0   ondansetron (ZOFRAN) 8 MG tablet, Take 1 tablet (8 mg total) by mouth every 8 (eight) hours as needed for nausea or vomiting., Disp: 30 tablet, Rfl: 1   oxyCODONE-acetaminophen (PERCOCET/ROXICET) 5-325 MG tablet, Take 1 tablet by mouth every 6 (six) hours as needed for severe pain., Disp: 30 tablet, Rfl: 0   prochlorperazine (COMPAZINE) 10 MG tablet, Take 1 tablet (10 mg total) by mouth every 6 (six) hours as needed for nausea or vomiting., Disp: 30 tablet, Rfl: 1   abemaciclib (VERZENIO) 150 MG tablet, Take 1 tablet (150 mg total) by mouth 2 (two) times daily. (Patient not taking: Reported on 03/09/2023), Disp: 56 tablet, Rfl: 0   omeprazole (PRILOSEC) 20 MG capsule, Take 1 capsule (20 mg total) by mouth daily., Disp: 90 capsule, Rfl: 3    Family History  Problem Relation Age of Onset   Hypertension Mother    Thyroid disease Mother    Breast cancer Mother 54   Colon cancer Mother 3       again at 62   Atrial fibrillation Mother    Hip fracture Mother    Skin cancer Mother    Stroke Father    Hypertension Father    Cancer Father 57       prostate   Parkinson's disease Father    Skin cancer Father    Hypertension Sister    Thyroid disease Sister    Cancer Sister        skin/ squamous cell   Diabetes Maternal Grandmother    Stroke Maternal Grandfather    Heart disease Paternal Grandfather    Diabetes Maternal Aunt    Cancer Maternal Aunt    Breast cancer Maternal Aunt 42   Lymphoma Maternal Uncle    Heart disease Maternal Uncle    Lung cancer Paternal Aunt 44       smoker  Thyroid cancer Cousin        maternal   Breast cancer Cousin 4       maternal   Breast  cancer Cousin 35   Colon cancer Cousin      Social History   Tobacco Use   Smoking status: Never   Smokeless tobacco: Never  Vaping Use   Vaping status: Never Used  Substance Use Topics   Alcohol use: Yes    Alcohol/week: 0.0 standard drinks of alcohol    Comment: rare, 1-2 drinks per year   Drug use: No    Allergies as of 03/09/2023 - Review Complete 03/09/2023  Allergen Reaction Noted   Kiwi extract Itching and Swelling 04/16/2015   Codeine Other (See Comments) 12/20/2011   Food Itching and Swelling 02/16/2022   Amoxicillin Rash 12/20/2011   Erythromycin Rash 12/20/2011   Sulfa antibiotics Rash 12/20/2011   Tape Rash 12/20/2011    Review of Systems:    All systems reviewed and negative except where noted in HPI.   Physical Exam:  BP (!) 88/61 (BP Location: Left Arm, Patient Position: Sitting, Cuff Size: Large)   Pulse 87   Temp 98.1 F (36.7 C) (Oral)   Ht 5\' 3"  (1.6 m)   Wt 268 lb (121.6 kg)   BMI 47.47 kg/m  No LMP recorded. Patient has had a hysterectomy.  General:   Alert,  Well-developed, well-nourished, pleasant and cooperative in NAD Head:  Normocephalic and atraumatic. Eyes:  Sclera clear, no icterus.   Conjunctiva pink. Ears:  Normal auditory acuity. Nose:  No deformity, discharge, or lesions. Mouth:  No deformity or lesions,oropharynx pink & moist. Neck:  Supple; no masses or thyromegaly. Lungs:  Respirations even and unlabored.  Clear throughout to auscultation.   No wheezes, crackles, or rhonchi. No acute distress. Heart:  Regular rate and rhythm; no murmurs, clicks, rubs, or gallops. Abdomen:  Normal bowel sounds. Soft, significant abdominal obesity, non-tender and non-distended without masses, hepatosplenomegaly or hernias noted.  No guarding or rebound tenderness.   Rectal: Not performed Msk:  Symmetrical without gross deformities. Good, equal movement & strength bilaterally. Pulses:  Normal pulses noted. Extremities:  No clubbing or edema.  No  cyanosis. Neurologic:  Alert and oriented x3;  grossly normal neurologically. Skin:  Intact without significant lesions or rashes. No jaundice. Psych:  Alert and cooperative. Normal mood and affect.  Imaging Studies: Reviewed  Assessment and Plan:   Jacqueline Deleon is a 62 y.o. pleasant Caucasian female with metabolic syndrome, BMI 48, stage IV ER positive metastatic breast cancer is seen in consultation for chronic epigastric pain and postprandial loose stools  Chronic epigastric pain, nausea, postprandial urgency, loose stools H. pylori breath test was negative in 2023 Okay to continue low-dose omeprazole Discussed in length regarding healthy eating habits, significantly cut back on red meat from 2-3 times a week to about twice a month Avoid all artificial sweeteners, carbonated beverages, sugary drinks Encouraged her to lose weight Disc gust about gastric emptying study, patient deferred at this time She does not have any alarm signs or symptoms to warrant repeat upper endoscopy at this time Patient's last EGD was in 2020 which was unremarkable Patient's last colonoscopy was in 2020 which was unremarkable If her symptoms are persistent, recommend GI profile PCR to rule out infection, repeat colonoscopy with random colon biopsies +/- pancreatic fecal elastase levels Encouraged healthy diet and exercise, avoidance of carbonated beverages IBgard samples provided  Patient does not have cirrhosis of liver, she  has fatty liver  Fibrosis 4 Score = 1.6 (Indeterminate)       Interpretation for patients with NAFLD          <1.30       -  F0-F1 (Low risk)          1.30-2.67 -  Indeterminate           >2.67      -  F3-F4 (High risk)     Validated for ages 14-65   Check [place] fibrosis panel  Follow up as needed   Arlyss Repress, MD

## 2023-03-10 ENCOUNTER — Encounter: Payer: Self-pay | Admitting: *Deleted

## 2023-03-10 ENCOUNTER — Telehealth: Payer: Self-pay | Admitting: Oncology

## 2023-03-10 NOTE — Telephone Encounter (Signed)
This patient called to say she desnt have test results back and would like to cancel her appointment 8/30. She said she has asked several times for appointment to be cancelled. I have cancelled her MD appoitnment. Just checking to see if the 2 injection appointments need to be cancelled also?  Please let me know.  Thanks!

## 2023-03-10 NOTE — Telephone Encounter (Signed)
Jacqueline Millet let her know we can start faslodex on 9/9 as planned since fes pet will be done on 9/6 even if we dont have results back its ok

## 2023-03-11 ENCOUNTER — Ambulatory Visit: Payer: 59

## 2023-03-11 ENCOUNTER — Inpatient Hospital Stay: Payer: 59 | Admitting: Oncology

## 2023-03-11 ENCOUNTER — Other Ambulatory Visit: Payer: Self-pay

## 2023-03-13 NOTE — Telephone Encounter (Signed)
Tell her we have the results of NGS blood test which we will discuss on 9/9

## 2023-03-15 ENCOUNTER — Encounter: Payer: Self-pay | Admitting: Gastroenterology

## 2023-03-15 ENCOUNTER — Other Ambulatory Visit (HOSPITAL_COMMUNITY): Payer: Self-pay

## 2023-03-17 ENCOUNTER — Other Ambulatory Visit (HOSPITAL_COMMUNITY): Payer: Self-pay

## 2023-03-17 LAB — NASH FIBROSURE(R) PLUS
ALPHA 2-MACROGLOBULINS, QN: 145 mg/dL (ref 110–276)
ALT (SGPT) P5P: 56 IU/L — ABNORMAL HIGH (ref 0–40)
AST (SGOT) P5P: 39 IU/L (ref 0–40)
Apolipoprotein A-1: 191 mg/dL (ref 116–209)
Bilirubin, Total: 0.1 mg/dL (ref 0.0–1.2)
Cholesterol, Total: 187 mg/dL (ref 100–199)
Fibrosis Score: 0.03 (ref 0.00–0.21)
GGT: 141 IU/L — ABNORMAL HIGH (ref 0–60)
Glucose: 135 mg/dL — ABNORMAL HIGH (ref 70–99)
Haptoglobin: 396 mg/dL — ABNORMAL HIGH (ref 33–346)
NASH Score: 0.58 — ABNORMAL HIGH (ref 0.00–0.25)
Steatosis Score: 0.87 — ABNORMAL HIGH (ref 0.00–0.40)
Triglycerides: 192 mg/dL — ABNORMAL HIGH (ref 0–149)

## 2023-03-18 ENCOUNTER — Encounter (HOSPITAL_COMMUNITY)
Admission: RE | Admit: 2023-03-18 | Discharge: 2023-03-18 | Disposition: A | Discharge status: Home / Self Care | Payer: 59 | Source: Ambulatory Visit | Attending: Oncology | Admitting: Oncology

## 2023-03-18 ENCOUNTER — Telehealth: Payer: Self-pay

## 2023-03-18 DIAGNOSIS — K760 Fatty (change of) liver, not elsewhere classified: Secondary | ICD-10-CM

## 2023-03-18 DIAGNOSIS — C7951 Secondary malignant neoplasm of bone: Secondary | ICD-10-CM | POA: Insufficient documentation

## 2023-03-18 DIAGNOSIS — C50911 Malignant neoplasm of unspecified site of right female breast: Secondary | ICD-10-CM | POA: Diagnosis not present

## 2023-03-18 DIAGNOSIS — C50919 Malignant neoplasm of unspecified site of unspecified female breast: Secondary | ICD-10-CM | POA: Diagnosis not present

## 2023-03-18 MED ORDER — FLUOROESTRADIOL F 18 4-100 MCI/ML IV SOLN
6.0000 | Freq: Once | INTRAVENOUS | Status: AC
  Administered 2023-03-18: 6.1 via INTRAVENOUS

## 2023-03-18 NOTE — Telephone Encounter (Signed)
Patient verbalized understanding of results. She will get lab work done at her office

## 2023-03-18 NOTE — Telephone Encounter (Signed)
-----   Message from [facility] sent at 03/17/2023 10:49 PM EDT ----- Jacqueline Deleon fibrosis panel reveals severe fatty liver disease no evidence of fibrosis.  Recommend to check hepatitis B surface antigen, surface antibody total, HCV antibody, hepatitis B core antibody total  RV

## 2023-03-19 ENCOUNTER — Other Ambulatory Visit: Payer: Self-pay

## 2023-03-21 ENCOUNTER — Other Ambulatory Visit: Payer: Self-pay

## 2023-03-21 ENCOUNTER — Other Ambulatory Visit (HOSPITAL_COMMUNITY): Payer: Self-pay

## 2023-03-21 ENCOUNTER — Encounter: Payer: Self-pay | Admitting: Oncology

## 2023-03-21 ENCOUNTER — Inpatient Hospital Stay (HOSPITAL_BASED_OUTPATIENT_CLINIC_OR_DEPARTMENT_OTHER): Payer: 59 | Admitting: Oncology

## 2023-03-21 ENCOUNTER — Inpatient Hospital Stay: Payer: 59 | Attending: Oncology

## 2023-03-21 ENCOUNTER — Telehealth: Payer: Self-pay

## 2023-03-21 VITALS — BP 98/54 | HR 87 | Temp 98.0°F | Resp 18 | Wt 265.5 lb

## 2023-03-21 DIAGNOSIS — Z170 Estrogen receptor positive status [ER+]: Secondary | ICD-10-CM | POA: Diagnosis not present

## 2023-03-21 DIAGNOSIS — G893 Neoplasm related pain (acute) (chronic): Secondary | ICD-10-CM | POA: Diagnosis not present

## 2023-03-21 DIAGNOSIS — Z808 Family history of malignant neoplasm of other organs or systems: Secondary | ICD-10-CM | POA: Insufficient documentation

## 2023-03-21 DIAGNOSIS — Z7189 Other specified counseling: Secondary | ICD-10-CM

## 2023-03-21 DIAGNOSIS — Z800 Family history of malignant neoplasm of digestive organs: Secondary | ICD-10-CM | POA: Diagnosis not present

## 2023-03-21 DIAGNOSIS — C50911 Malignant neoplasm of unspecified site of right female breast: Secondary | ICD-10-CM

## 2023-03-21 DIAGNOSIS — M25552 Pain in left hip: Secondary | ICD-10-CM | POA: Diagnosis not present

## 2023-03-21 DIAGNOSIS — C7951 Secondary malignant neoplasm of bone: Secondary | ICD-10-CM | POA: Diagnosis not present

## 2023-03-21 DIAGNOSIS — M25551 Pain in right hip: Secondary | ICD-10-CM | POA: Diagnosis not present

## 2023-03-21 DIAGNOSIS — C50919 Malignant neoplasm of unspecified site of unspecified female breast: Secondary | ICD-10-CM

## 2023-03-21 NOTE — Progress Notes (Signed)
Patient has been running a low grade fever which started Saturday morning. She also has some ongoing pain in the right hip area. Her throat is also bothering her. It started on the right side of her throat and has now moved to the left side.

## 2023-03-21 NOTE — Telephone Encounter (Signed)
Staff message sent to Cardiologist Dr. Lalla Brothers about DDI with diltiazem.

## 2023-03-21 NOTE — Telephone Encounter (Addendum)
Clinical Pharmacist Encounter   Received new prescription for Orserdu (elacestrant) for the treatment of Stage IV ER+ breast cancer with bone mets, planned duration until disease progression or intolerable toxicities. 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   Labs from 02/04/2023 assessed, CBC/BMP within normal limits. Prescription dose and frequency assessed and appropriate.   Current medication list in Epic reviewed, 1 DDIs with diltiazem identified: Medication is being prescribed by her cardiologist, Steffanie Dunn, will reach out to stop this medication or switch to alternative therapy since it is a Category X contraindication.   Evaluated chart and no patient barriers to medication adherence identified.   Prescription has been e-scribed to the [facility] for benefits analysis and approval.  Oral Oncology Clinic will continue to follow for insurance authorization, copayment issues, initial counseling and start date.  Patient agreed to treatment on 03/21/2023 per MD documentation.  Gara Kroner, PharmD PGY1 Pharmacy Resident - [facility] [phone]  03/21/2023 1:28 PM

## 2023-03-21 NOTE — Progress Notes (Signed)
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  It was a 1.4 cm tumor with negative lymph nodes grade 1 and patient went on to get lumpectomy followed by adjuvant radiation therapy.  Oncotype DX score was 6 and she did not require adjuvant chemotherapy.  She was on tamoxifen for 5 years.  She had BRCA testing done at that time which was negative.   Patient felt a lump in her right axilla in September 2021.  She underwent ultrasound of bilateral breasts which did not pick up any axillary mass.  A prior screening mammogram in September 2021 was unremarkable.  She was then admitted for Covid pneumonia and underwent CT angio chest on 07/22/2020 which incidentally picked up an axillary mass measuring 2.5 cm.  No obvious breast mass.  Axillary mass was biopsied and was consistent with high-grade invasive mammary carcinoma.  IHC was positive for GATA3 with diffuse strong nuclear staining.  There was no lymph node architecture identified in the sample and it could not be determined if it is a primary tumor within the axillary breast tissue or metastases.  ER strongly positive greater than 90%, PR 1 to 10% positive and HER-2 negative   PET CT scan showed hypermetabolic poorly marginated solid right axillary mass 2.5 x 2 cm with an SUV of 5.6.  No enlarged hypermetabolic  mediastinal or hilar lymph nodes no enlarged left axillary lymph nodes.  Subcentimeter lung nodules below PET resolution.  Small hypermetabolism in the right liver with an SUV of 6 without CT correlate equivocal for liver metastases.  Multiple hypermetabolic faintly lytic osseous lesions throughout the lumbar spine and bilateral pelvic girdle with representative supra-acetabular right iliac bone 1.9 cm lesion, anterior superior left acetabular 1.7 cm lesion and L1 1.2 cm lesion.   NGS testing showed no actionable mutations.  PD-L1 less than 1%.  CHEK2 S422 FS, PIK3CA N1068 FS, PIK3CA N345H, CCN E1 gain, ERB B2 1 P-CSF 1R fusion, FGF 19 gain, FGF 3 gain, FGF 4gain.   Ibrance plus letrozole started in February 2022.    Patient noted to have increase in CA 27-29 from 26 and November 2023 203 in July 2024.  CT chest abdomen pelvis with contrast and bone scan showed worsening metastatic disease in the bones especially in the thoracolumbar spine and right hemipelvis.  Plan is therefore to switch her to Verzenio plus fulvestrant  Peripheral blood NGS testing showed CHEK2 mutation, PIK3CA P.N1068 FS, PIK3CA P.N345H, PTEN P.R173S, ESR 1P.D538G, ESR 1P.L536R, PIK3CA P.R88Q.  MSI high not detected    Interval history-patient has been having some increasing bilateral hip pain.  She has not started taking Verzenio yet.  ECOG PS- 1 Pain scale- 4   Review of systems- Review of Systems  Constitutional:  Positive for malaise/fatigue. Negative for chills, fever and weight loss.  HENT:  Negative for congestion, ear discharge and nosebleeds.   Eyes:  Negative for blurred vision.  Respiratory:  Negative for cough, hemoptysis, sputum production, shortness of breath and wheezing.   Cardiovascular:  Negative for chest pain, palpitations, orthopnea and claudication.  Gastrointestinal:  Negative for abdominal pain, blood in stool, constipation, diarrhea, heartburn, melena, nausea and vomiting.  Genitourinary:  Negative  for dysuria, flank pain, frequency, hematuria and urgency.  Musculoskeletal:  Positive for back pain. Negative for joint pain and myalgias.  Skin:  Negative for rash.  Neurological:  Negative for dizziness, tingling, focal weakness, seizures, weakness and headaches.  Endo/Heme/Allergies:  Does not bruise/bleed easily.  Psychiatric/Behavioral:  Negative for depression and suicidal ideas. The patient does not have insomnia.       Allergies  Allergen Reactions   Kiwi Extract Itching and Swelling    The real kiwi fruit   Codeine Other (See Comments)    Felt funny.   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   Chicken pox    Depression    Fatty liver    Fatty liver    Gastric polyps    GERD (gastroesophageal reflux disease)    Gestational diabetes    prediabetes out side of having baby   Headache    migraines   Heart murmur    History of abnormal mammogram 08/2006, 01/2008, 03/2009   Hypertension    IBS (irritable bowel syndrome)    Joint disorder    hx of left ankle tendonitis, bursitis, heel spur   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 Smith/ laprascopic surgery   COLONOSCOPY  04/04/2000   COLONOSCOPY WITH PROPOFOL N/A 02/12/2019   Procedure: COLONOSCOPY WITH PROPOFOL;  Surgeon: Christena Deem, MD;  Location: [facility] ENDOSCOPY;  Service: Endoscopy;  Laterality: N/A;   ESOPHAGOGASTRODUODENOSCOPY (EGD) WITH PROPOFOL N/A 05/01/2015   Procedure: ESOPHAGOGASTRODUODENOSCOPY (EGD) WITH PROPOFOL;  Surgeon: Wallace Cullens, MD;  Location: [facility] ENDOSCOPY;  Service: Gastroenterology;  Laterality: N/A;   ESOPHAGOGASTRODUODENOSCOPY (EGD) WITH PROPOFOL N/A 02/12/2019   Procedure: ESOPHAGOGASTRODUODENOSCOPY (EGD) WITH PROPOFOL;  Surgeon: Christena Deem, MD;  Location: [facility] ENDOSCOPY;  Service: Endoscopy;  Laterality: N/A;  EYE SURGERY  08/2012   laser surgery on retina/ DR Appenzeller   FINGER SURGERY     repair of tendon in right index, Dr. Benay Pillow in [facility]   FOOT SURGERY     Dr. Al Corpus   IRRIGATION AND DEBRIDEMENT SEBACEOUS CYST     right upper back   LAPAROSCOPIC SUPRACERVICAL HYSTERECTOMY  06/2007   Dr Kincius/ adhesions/CPP/adenomyosis   MASTECTOMY PARTIAL / LUMPECTOMY Right 01/2008   with sentinal lymph node biopsy/ infiltrating ductal cancer   REPAIR PERONEAL TENDONS ANKLE  10/2019   with tarsal exostectomy and sural neuroma excision on right    SKIN CANCER EXCISION     back and calves   WISDOM TOOTH EXTRACTION  1991    Social History   Socioeconomic History   Marital status: Married    Spouse name: Brett Canales   Number of children: 1   Years of education: 14   Highest education level: Not on file  Occupational History   Occupation: CMA  Tobacco Use   Smoking status: Never   Smokeless tobacco: Never  Vaping Use    Vaping status: Never Used  Substance and Sexual Activity   Alcohol use: Yes    Alcohol/week: 0.0 standard drinks of alcohol    Comment: rare, 1-2 drinks per year   Drug use: No   Sexual activity: Yes    Partners: Male    Birth control/protection: Surgical    Comment: Hysterectomy   Other Topics Concern   Not on file  Social History Narrative   Lives in [place] with husband, no pets.  Works at [employer].      Diet - regular   Exercise - occasional   Social Determinants of Health   Financial Resource Strain: Low Risk  (11/23/2022)   [facility], [facility]   Overall Financial Resource Strain (CARDIA)    Difficulty of Paying Living Expenses: Not hard at all  Food Insecurity: No Food Insecurity (11/23/2022)   [facility] from [facility], [facility]   Hunger Vital Sign    Worried About Running Out of Food in the Last Year: Never true    Ran Out of Food in the Last Year: Never true  Transportation Needs: No Transportation Needs (11/23/2022)   Received from [facility], [facility]   [facility] - Transportation    In the past 12 months, has lack of transportation kept you from medical appointments or from getting medications?: No    Lack of Transportation (Non-Medical): No  Physical Activity: Insufficiently Active (07/27/2017)   Exercise Vital Sign    Days of Exercise per Week: 4 days    Minutes of Exercise per Session: 30 min  Stress: No Stress Concern Present (07/27/2017)   [employer] of Occupational Health - Occupational Stress Questionnaire    Feeling of Stress : Only a little  Social Connections: Socially Integrated (07/27/2017)   Social Connection and Isolation Panel [NHANES]    Frequency of Communication with Friends and Family: More than three times a week    Frequency of Social Gatherings with Friends and Family: Once a week    Attends Religious  Services: More than 4 times per year    Active Member of [employer] or Organizations: Yes    Attends Banker Meetings: More than 4 times per year    Marital Status: Married  Catering manager Violence: Not At Risk (07/27/2017)   Humiliation, Afraid, Rape, and  Kick questionnaire    Fear of Current or Ex-Partner: No    Emotionally Abused: No    Physically Abused: No    Sexually Abused: No    Family History  Problem Relation Age of Onset   Hypertension Mother    Thyroid disease Mother    Breast cancer Mother 83   Colon cancer Mother 55       again at 91   Atrial fibrillation Mother    Hip fracture Mother    Skin cancer Mother    Stroke Father    Hypertension Father    Cancer Father 19       prostate   Parkinson's disease Father    Skin cancer Father    Hypertension Sister    Thyroid disease Sister    Cancer Sister        skin/ squamous cell   Diabetes Maternal Grandmother    Stroke Maternal Grandfather    Heart disease Paternal Grandfather    Diabetes Maternal Aunt    Cancer Maternal Aunt    Breast cancer Maternal Aunt 37   Lymphoma Maternal Uncle    Heart disease Maternal Uncle    Lung cancer Paternal Aunt 48       smoker   Thyroid cancer Cousin        maternal   Breast cancer Cousin 73       maternal   Breast cancer Cousin 61   Colon cancer Cousin      Current Outpatient Medications:    aspirin EC 81 MG tablet, Take by mouth., Disp: , Rfl:    b complex vitamins capsule, Take 1 capsule by mouth daily., Disp: , Rfl:    buPROPion (WELLBUTRIN XL) 150 MG 24 hr tablet, Take 1 tablet (150 mg total) by mouth daily. Take along a 300mg  tablet for a total daily dose of 450mg ., Disp: 90 tablet, Rfl: 3   buPROPion (WELLBUTRIN XL) 300 MG 24 hr tablet, Take 1 tablet (300 mg total) by mouth daily., Disp: 90 tablet, Rfl: 3   butalbital-acetaminophen-caffeine (FIORICET) 50-325-40 MG tablet, Take 1 tablet by mouth every 6 (six) hours as needed for headache., Disp: 30 tablet,  Rfl: 0   calcium carbonate (OSCAL) 1500 (600 Ca) MG TABS tablet, Take by mouth 2 (two) times daily with a meal., Disp: , Rfl:    Cholecalciferol (VITAMIN D3) 10 MCG (400 UNIT) CAPS, Take by mouth., Disp: , Rfl:    citalopram (CELEXA) 40 MG tablet, Take 1 tablet (40 mg total) by mouth daily., Disp: 90 tablet, Rfl: 3   diltiazem (CARDIZEM CD) 120 MG 24 hr capsule, Take 1 capsule by mouth daily. *Appt needed for future refills*, Disp: 90 capsule, Rfl: 0   diphenhydrAMINE (BENADRYL) 25 MG tablet, Take 25 mg by mouth at bedtime as needed for sleep. Pt states daily now for sleep and allergies., Disp: , Rfl:    gabapentin (NEURONTIN) 100 MG capsule, Take 1 capsule (100 mg total) by mouth every morning AND 2 capsules (200 mg total) every evening., Disp: 270 capsule, Rfl: 1   ketotifen (ZADITOR) 0.025 % ophthalmic solution, Place 1 drop into both eyes daily., Disp: , Rfl:    lisinopril (ZESTRIL) 10 MG tablet, Take 1 tablet (10 mg total) by mouth daily., Disp: 90 tablet, Rfl: 1   loperamide (IMODIUM) 2 MG capsule, Take 2 tabs by mouth with first loose stool, then 1 tab with each additional loose stool as needed. Do not exceed 8 tabs in a 24-hour period,  Disp: 30 capsule, Rfl: 0   metFORMIN (GLUCOPHAGE-XR) 500 MG 24 hr tablet, Take 1 tablet (500 mg total) by mouth 2 (two) times daily with a meal., Disp: 180 tablet, Rfl: 1   Multiple Vitamin (MULTIVITAMIN) tablet, Take 1 tablet by mouth daily., Disp: , Rfl:    omeprazole (PRILOSEC) 20 MG capsule, Take 1 capsule (20 mg total) by mouth daily., Disp: 90 capsule, Rfl: 3   ondansetron (ZOFRAN) 4 MG tablet, Take 1 tablet (4 mg total) by mouth every 8 (eight) hours as needed for nausea or vomiting., Disp: 90 tablet, Rfl: 0   ondansetron (ZOFRAN) 8 MG tablet, Take 1 tablet (8 mg total) by mouth every 8 (eight) hours as needed for nausea or vomiting., Disp: 30 tablet, Rfl: 1   oxyCODONE-acetaminophen (PERCOCET/ROXICET) 5-325 MG tablet, Take 1 tablet by mouth every 6 (six)  hours as needed for severe pain., Disp: 30 tablet, Rfl: 0   prochlorperazine (COMPAZINE) 10 MG tablet, Take 1 tablet (10 mg total) by mouth every 6 (six) hours as needed for nausea or vomiting., Disp: 30 tablet, Rfl: 1   abemaciclib (VERZENIO) 150 MG tablet, Take 1 tablet (150 mg total) by mouth 2 (two) times daily. (Patient not taking: Reported on 03/09/2023), Disp: 56 tablet, Rfl: 0  Physical exam:  Vitals:   03/21/23 1105  BP: (!) 98/54  Pulse: 87  Resp: 18  Temp: 98 F (36.7 C)  TempSrc: Tympanic  SpO2: 92%  Weight: 265 lb 8 oz (120.4 kg)   Physical Exam Cardiovascular:     Rate and Rhythm: Normal rate and regular rhythm.     Heart sounds: Normal heart sounds.  Pulmonary:     Effort: Pulmonary effort is normal.  Skin:    General: Skin is warm and dry.  Neurological:     Mental Status: She is alert and oriented to person, place, and time.        Latest Ref Rng & Units 02/04/2023   12:58 PM  CMP  Glucose 70 - 99 mg/dL 098   BUN 6 - 20 mg/dL 20   Creatinine 1.19 - 1.00 mg/dL 1.47   Sodium 829 - 562 mmol/L 136   Potassium 3.5 - 5.1 mmol/L 3.9   Chloride 98 - 111 mmol/L 103   CO2 22 - 32 mmol/L 22   Calcium 8.9 - 10.3 mg/dL 9.0   Total Protein 6.5 - 8.1 g/dL 7.3   Total Bilirubin 0.3 - 1.2 mg/dL 0.4   Alkaline Phos 38 - 126 U/L 106   AST 15 - 41 U/L 24   ALT 0 - 44 U/L 25       Latest Ref Rng & Units 02/04/2023   12:58 PM  CBC  WBC 4.0 - 10.5 K/uL 3.8   Hemoglobin 12.0 - 15.0 g/dL 13.0   Hematocrit 86.5 - 46.0 % 35.6   Platelets 150 - 400 K/uL 180     Assessment and plan- Patient is a 62 y.o. female with metastatic ER positive breast cancer with bone metastases here to discuss further goals of care  Patient had an FES PET scan the results of which are pending.  She has had progression on Ibrance plus letrozole regimen with development of worsening bone metastases as well as increasing her tumor markers.  She has had nearly 1-1/2 years of disease control on this  regimen.  Initially my plan was to switch her to [facility] plus Faslodex as there is some data to rotate CDK inhibitors upon progression.  However given the fact that her peripheral blood NGS testing shows both ESR 1 and PIK3CA mutation either of those targets can be used upon progression for second line treatment.  I would prefer the use of elacestrant over faslodex plus capiversitib at this time.elacestrant was compared to Faslodex in the [facility] trial as mentioned below and was better in terms of PFS and OS.  In the [facility] trial, elacestrant was compared against standard of care [facility]) ET (investigator's choice of fulvestrant or an AI) among 477 patients who had received one or two prior lines of ET and up to one line of chemotherapy in the metastatic setting, and who had progressed on prior treatment with a CDK 4/6 inhibitor. The majority of patients in the [facility] group received fulvestrant. At 12 months, the elacestrant group had a better PFS rate than the [facility] group and those specifically receiving fulvestrant (22 versus 9 and 10 percent, respectively). Greater PFS improvements were observed in the subgroup with ESR1 mutations with elacestrant versus SOC and fulvestrant (27-month PFS rates of 27 versus 8.2 and 8.4 percent, respectively). Benefit of elacestrant is more marked in patients who experienced longer PFS on the prior ET plus CDK 4/6 inhibitor (likely more endocrine sensitive disease)  Discussed risks and benefits of elacestrant including all but not limited to hypercholesterolemia hypertriglyceridemia, nausea vomiting diarrhea low blood counts abnormal LFTs.  There is a category X interaction with diltiazem and patient is on that drug.  We will need to reach out to cardiology to see if she can be switched to an alternative drugs before she can start using elacestrant.  We will work on Clinical biochemist for the drug in the meanwhile  Labs CBC with differential CMP lipid profile and CA 27-29  in 1 month and see me.  Given that she had CHEK2 mutation on somatic testing I will send her for genetic counseling as well.  She has had previous BRCA 1 and 2 testing which was negative but would likely benefit from a comprehensive genetic testing as it would have repercussions for her children as well.   Visit Diagnosis 1. 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 Goals of care, counseling/discussion   3. Neoplasm related pain      Dr. Owens Shark, MD, MPH [facility] [phone] 03/21/2023 3:14 PM

## 2023-03-22 ENCOUNTER — Telehealth: Payer: Self-pay

## 2023-03-22 NOTE — Telephone Encounter (Signed)
Oral Oncology Patient Advocate Encounter  New authorization   Received notification that prior authorization for Orserdu is required.   PA submitted on 03/22/23  Key [id number]  Status is pending     Ardeen Fillers, CPhT Oncology Pharmacy Patient Advocate  [facility]  [phone] (phone) [phone] (fax) 03/22/2023 9:04 AM

## 2023-03-23 ENCOUNTER — Other Ambulatory Visit (HOSPITAL_COMMUNITY): Payer: Self-pay

## 2023-03-23 ENCOUNTER — Telehealth: Payer: Self-pay | Admitting: Cardiology

## 2023-03-23 MED ORDER — ELACESTRANT HYDROCHLORIDE 345 MG PO TABS: 345.0000 mg | ORAL_TABLET | Freq: Every day | ORAL | 1 refills | Status: DC

## 2023-03-23 NOTE — Telephone Encounter (Addendum)
Called and left VM for patient to call me back to go over cost of medication and to let patient know what pharmacy we had to send Orserdu to for processing and fulfillment ([facility]).   1st call - Left VM 03/23/23  2nd call - Patient reached 03/25/23  ** While waiting for patient to return my call about [facility], [facility] transferred script to [facility] as they are the second-in-line Specialty Pharmacy to use per patient's insurance.**  Spoke to patient about having to send Orserdu script to [facility]. Patient was provided phone number to [facility] [phone]) and knows to expect a call from them to set up shipment. Patient also knows to call me at [phone] with any questions or concerns regarding receiving medication or if there is any unexpected change in co-pay.  **Patient knows they will NOT start medication until we have resolved DDI with patient's Cardizem**    Ardeen Fillers, CPhT Oncology Pharmacy Patient Advocate  [facility]  [phone] (phone) [phone] (fax) 03/23/2023 12:01 PM

## 2023-03-23 NOTE — Telephone Encounter (Signed)
Spoke with Dr. Smith Robert who states that she is treating the patient for metastatic breast cancer. The patient was approved to start on a treatment medication called elacesatrant hydrochloride. Dr. Smith Robert states that she is not able to start the patient on it because it interacts with diltiazem. She would like to know if the patient can be switched to something other than diltiazem that does not interact.

## 2023-03-23 NOTE — Telephone Encounter (Signed)
Dr. Assunta Gambles office called and said that he is changing her oncology medication as well medication prescribed by Dr. Lalla Brothers. Please call back to doctor at [phone]

## 2023-03-23 NOTE — Telephone Encounter (Addendum)
Oral Oncology Patient Advocate Encounter  Prior Authorization for Orserdu has been approved.    PA# [id number]  Effective dates: 03/22/23 through 03/20/24  Patients co-pay is $0.00.   Orserdu is a Limited Distribution Drug and will be filled at [facility].  Script and all supporting documents sent to [facility] for processing and fulfillment.    Ardeen Fillers, CPhT Oncology Pharmacy Patient Advocate  [facility]  [phone] (phone) [phone] (fax) 03/23/2023 8:49 AM

## 2023-03-23 NOTE — Telephone Encounter (Signed)
It appears Dr. Lalla Brothers is out of office until 9/16. Call made to DR. Lambert's office and left message with triage for covering provider to call Dr. Smith Robert to discuss DDI.

## 2023-03-23 NOTE — Telephone Encounter (Signed)
Meds are contraindicated to use together - diltiazem increases concentrations of elacestrant. Would see if MD is ok changing to beta blocker for rate control instead to avoid drug interaction.

## 2023-03-24 ENCOUNTER — Encounter: Payer: Self-pay | Admitting: Oncology

## 2023-03-24 ENCOUNTER — Other Ambulatory Visit (HOSPITAL_COMMUNITY): Payer: Self-pay

## 2023-03-24 ENCOUNTER — Encounter: Payer: Self-pay | Admitting: Cardiology

## 2023-03-25 ENCOUNTER — Other Ambulatory Visit (HOSPITAL_COMMUNITY): Payer: Self-pay

## 2023-03-25 ENCOUNTER — Ambulatory Visit: Payer: 59

## 2023-03-25 MED ORDER — ELACESTRANT HYDROCHLORIDE 345 MG PO TABS: 345.0000 mg | ORAL_TABLET | Freq: Every day | ORAL | 1 refills | Status: DC

## 2023-03-25 NOTE — Telephone Encounter (Signed)
[facility] (Specialty) does not have access to [employer], [employer] requirement patient will have to fill at [facility] for atleast the first fill. In future patient may have to change to [facility].   Rx redirected to [facility].

## 2023-03-27 ENCOUNTER — Other Ambulatory Visit: Payer: Self-pay | Admitting: Oncology

## 2023-03-27 ENCOUNTER — Encounter: Payer: Self-pay | Admitting: Oncology

## 2023-03-28 ENCOUNTER — Encounter: Payer: Self-pay | Admitting: Oncology

## 2023-03-28 MED ORDER — OXYCODONE-ACETAMINOPHEN 5-325 MG PO TABS: 1.0000 | ORAL_TABLET | Freq: Four times a day (QID) | ORAL | 0 refills | Status: DC | PRN

## 2023-03-29 NOTE — Telephone Encounter (Signed)
Can you please reach out to patient today and get her started on elacestrant?

## 2023-03-29 NOTE — Telephone Encounter (Signed)
Clinical Pharmacist Practitioner Encounter   Patient Education I spoke with patient for overview of new oral chemotherapy medication: Orserdu (elacestrant) for the treatment of Stage IV ER+ breast cancer, planned duration until disease progression or intolerable toxicities. 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   Counseled patient on administration, dosing, side effects, monitoring, drug-food interactions, safe handling, storage, and disposal. Patient will take 1 tablet (345 mg total) by mouth daily. Take with food.   **She will start her Orserdu today 03/29/23 or tomorrow 03/30/23  Side effects include but not limited to: fatigue, nausea, muscle pain, increase in cholesterol.    Reviewed with patient importance of keeping a medication schedule and plan for any missed doses.  After discussion with patient no patient barriers to medication adherence identified.   Jacqueline Deleon voiced understanding and appreciation. All questions answered. Medication handout provided.  Provided patient with Oral Chemotherapy Navigation Clinic phone number. Patient knows to call the office with questions or concerns. Oral Chemotherapy Navigation Clinic will continue to follow.  Remi Haggard, PharmD, BCPS, BCOP, CPP Hematology/Oncology Clinical Pharmacist Practitioner [facility] [phone]  03/29/2023 9:24 AM

## 2023-03-29 NOTE — Telephone Encounter (Addendum)
Spoke with pt and advised per Dr Lalla Brothers, stop Diltiazem and if atrial flutter returns will start another medication at that time.  Pt verbalizes understanding and agrees with current plan.

## 2023-03-30 ENCOUNTER — Telehealth: Payer: Self-pay | Admitting: Licensed Clinical Social Worker

## 2023-03-30 ENCOUNTER — Encounter: Payer: Self-pay | Admitting: Licensed Clinical Social Worker

## 2023-03-30 DIAGNOSIS — Z1379 Encounter for other screening for genetic and chromosomal anomalies: Secondary | ICD-10-CM | POA: Insufficient documentation

## 2023-03-30 DIAGNOSIS — Z1589 Genetic susceptibility to other disease: Secondary | ICD-10-CM | POA: Insufficient documentation

## 2023-03-30 NOTE — Telephone Encounter (Signed)
Called Jacqueline Deleon to let her know she had genetic testing done in 2022 and that we would not need to update testing at this time. 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 Disclosed updated NCCN information about this gene - colon cancer is no longer thought to be associated with CHEK2. Let her know she can call with any questions/concerns and she agreed to cancel upcoming appointment with me.     Lacy Duverney, MS, [facility] Genetic Counselor [name] Phone: [phone]

## 2023-04-01 ENCOUNTER — Other Ambulatory Visit: Payer: Self-pay | Admitting: Oncology

## 2023-04-01 MED ORDER — MORPHINE SULFATE ER 15 MG PO TBCR
15.0000 mg | EXTENDED_RELEASE_TABLET | Freq: Two times a day (BID) | ORAL | 0 refills | Status: DC
Start: 2023-04-01 — End: 2023-05-17

## 2023-04-04 ENCOUNTER — Ambulatory Visit: Payer: 59

## 2023-04-04 ENCOUNTER — Ambulatory Visit: Payer: 59 | Admitting: Obstetrics and Gynecology

## 2023-04-04 ENCOUNTER — Other Ambulatory Visit: Payer: Self-pay

## 2023-04-06 ENCOUNTER — Encounter: Payer: Self-pay | Admitting: Gastroenterology

## 2023-04-06 DIAGNOSIS — K760 Fatty (change of) liver, not elsewhere classified: Secondary | ICD-10-CM | POA: Diagnosis not present

## 2023-04-07 ENCOUNTER — Encounter: Payer: Self-pay | Admitting: Obstetrics and Gynecology

## 2023-04-07 ENCOUNTER — Other Ambulatory Visit: Payer: Self-pay

## 2023-04-07 ENCOUNTER — Other Ambulatory Visit (HOSPITAL_COMMUNITY): Payer: Self-pay

## 2023-04-07 ENCOUNTER — Ambulatory Visit (INDEPENDENT_AMBULATORY_CARE_PROVIDER_SITE_OTHER): Payer: 59 | Admitting: Obstetrics and Gynecology

## 2023-04-07 ENCOUNTER — Other Ambulatory Visit (HOSPITAL_COMMUNITY)
Admission: RE | Admit: 2023-04-07 | Discharge: 2023-04-07 | Disposition: A | Discharge status: Home / Self Care | Payer: 59 | Source: Ambulatory Visit | Attending: Obstetrics and Gynecology | Admitting: Obstetrics and Gynecology

## 2023-04-07 ENCOUNTER — Telehealth: Payer: Self-pay

## 2023-04-07 VITALS — BP 132/75 | Ht 63.0 in | Wt 260.0 lb

## 2023-04-07 DIAGNOSIS — Z124 Encounter for screening for malignant neoplasm of cervix: Secondary | ICD-10-CM | POA: Diagnosis not present

## 2023-04-07 DIAGNOSIS — Z1151 Encounter for screening for human papillomavirus (HPV): Secondary | ICD-10-CM | POA: Insufficient documentation

## 2023-04-07 DIAGNOSIS — E119 Type 2 diabetes mellitus without complications: Secondary | ICD-10-CM | POA: Diagnosis not present

## 2023-04-07 DIAGNOSIS — Z01419 Encounter for gynecological examination (general) (routine) without abnormal findings: Secondary | ICD-10-CM

## 2023-04-07 DIAGNOSIS — N951 Menopausal and female climacteric states: Secondary | ICD-10-CM

## 2023-04-07 DIAGNOSIS — H2513 Age-related nuclear cataract, bilateral: Secondary | ICD-10-CM | POA: Diagnosis not present

## 2023-04-07 DIAGNOSIS — C50911 Malignant neoplasm of unspecified site of right female breast: Secondary | ICD-10-CM

## 2023-04-07 DIAGNOSIS — Z1589 Genetic susceptibility to other disease: Secondary | ICD-10-CM

## 2023-04-07 DIAGNOSIS — Z1231 Encounter for screening mammogram for malignant neoplasm of breast: Secondary | ICD-10-CM

## 2023-04-07 DIAGNOSIS — H35711 Central serous chorioretinopathy, right eye: Secondary | ICD-10-CM | POA: Diagnosis not present

## 2023-04-07 LAB — HEPATITIS C ANTIBODY: Hep C Virus Ab: NONREACTIVE

## 2023-04-07 LAB — HEPATITIS B SURFACE ANTIBODY,QUALITATIVE: Hep B Surface Ab, Qual: REACTIVE

## 2023-04-07 LAB — HEPATITIS B CORE ANTIBODY, TOTAL: Hep B Core Total Ab: NEGATIVE

## 2023-04-07 LAB — HEPATITIS B SURFACE ANTIGEN: Hepatitis B Surface Ag: NEGATIVE

## 2023-04-07 MED ORDER — BUSPIRONE HCL 10 MG PO TABS
10.0000 mg | ORAL_TABLET | Freq: Two times a day (BID) | ORAL | 0 refills | Status: DC
  Filled 2023-04-07: qty 180, 90d supply, fill #0

## 2023-04-07 NOTE — Telephone Encounter (Signed)
-----   Message from [facility] sent at 04/07/2023  1:52 PM EDT ----- Serologies came back negative.  Recommend hepatitis B vaccine  RV

## 2023-04-07 NOTE — Progress Notes (Signed)
PCP: Kandyce Rud, MD   Chief Complaint  Patient presents with   Gynecologic Exam    No concerns    HPI:      [name] is a 62 y.o. G1P0101 whose LMP was No LMP recorded. Patient has had a hysterectomy., presents today for her annual examination.  Her menses are absent due to supracx hyst. No PMB.  She does have tolerable vasomotor sx, can't do hormones.   Sex activity: single partner, contraception - post menopausal status. She does have vaginal dryness improved with lubricants but still uncomfortable. Can't have ERT.   Last Pap: 01/23/20 Results were: no abnormalities /neg HPV DNA 2019. Still has cx Hx of STDs: none  Last mammogram: 08/11/21 Results were normal, repeat in 12 months. 08/20/20 Results were: cat 6 for RT axillary mass that was positive for recurrent metastatic breast cancer on bx. Pt followed by oncology at [facility] (last appt 9/24) and has seen [facility]. Recently changed to elacestrant due to lesion increase/increased ca 27.29. Mets documented to pelvic girdle, liver and spine and shoulder blades now. Did short course radiation tx to leg due to pain. Sx improved. There is a FH of breast cancer in her mom and mat aunt. Mom also with colon cancer.  There is no FH of ovarian cancer. The patient does do self-breast exams. Pt is BRCA neg 2004/ MyRisk panel done 2/22 was CHEK 2 positive.  Colonoscopy: 2020 without abnormalities;  Repeat due after 5 years per NCCN guidelines. Mother with hx of colon cancer but pt found to be CHEK2 positive (increased risk of colon cancer)   Tobacco use: The patient denies current or previous tobacco use. Alcohol use: none No drug use Exercise: min active  She does get adequate calcium and Vitamin D in her diet.  Labs with PCP. DEXA with oncology 9/23 at [facility] was normal spine/hip.    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   Chicken pox    Depression    Fatty liver    Fatty liver    Gastric polyps    GERD (gastroesophageal reflux disease)    Gestational diabetes    prediabetes out side of having baby   Headache    migraines   Heart murmur    History of abnormal mammogram 08/2006, 01/2008, 03/2009   Hypertension    IBS (irritable bowel syndrome)    Joint disorder    hx of left ankle tendonitis, bursitis, heel spur   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 Smith/ laprascopic surgery   COLONOSCOPY  04/04/2000   COLONOSCOPY WITH PROPOFOL N/A 02/12/2019   Procedure: COLONOSCOPY WITH PROPOFOL;  Surgeon: Christena Deem, MD;  Location: [facility] ENDOSCOPY;  Service: Endoscopy;  Laterality: N/A;   ESOPHAGOGASTRODUODENOSCOPY (EGD) WITH PROPOFOL N/A 05/01/2015   Procedure: ESOPHAGOGASTRODUODENOSCOPY (EGD) WITH PROPOFOL;  Surgeon: Wallace Cullens, MD;  Location: [facility] ENDOSCOPY;  Service: Gastroenterology;  Laterality: N/A;   ESOPHAGOGASTRODUODENOSCOPY (EGD) WITH PROPOFOL N/A 02/12/2019   Procedure: ESOPHAGOGASTRODUODENOSCOPY (EGD) WITH  PROPOFOL;  Surgeon: Christena Deem, MD;  Location: [facility] ENDOSCOPY;  Service: Endoscopy;  Laterality: N/A;   EYE SURGERY  08/2012   laser surgery on retina/ DR Appenzeller   FINGER SURGERY     repair of tendon in right index, Dr. Benay Pillow in [place]   FOOT SURGERY     Dr. Al Corpus   IRRIGATION AND DEBRIDEMENT SEBACEOUS CYST     right upper back   LAPAROSCOPIC SUPRACERVICAL HYSTERECTOMY  06/2007   Dr Kincius/ adhesions/CPP/adenomyosis   MASTECTOMY PARTIAL / LUMPECTOMY Right 01/2008   with sentinal lymph node biopsy/ infiltrating ductal cancer   REPAIR PERONEAL TENDONS ANKLE  10/2019   with tarsal exostectomy and sural neuroma excision on right    SKIN CANCER EXCISION     back and calves   WISDOM TOOTH EXTRACTION  1991    Family History  Problem Relation Age of Onset   Hypertension Mother    Thyroid disease Mother    Breast cancer Mother 70   Colon cancer Mother 41       again at 88   Atrial fibrillation Mother    Hip fracture Mother    Skin cancer Mother    Stroke Father    Hypertension Father    Cancer Father 79       prostate   Parkinson's disease Father    Skin cancer Father    Hypertension Sister    Thyroid disease Sister    Cancer Sister        skin/ squamous cell   Heart Problems Sister    Diabetes Maternal Aunt    Cancer Maternal Aunt    Breast cancer Maternal Aunt 41   Lymphoma Maternal Uncle    Heart disease Maternal Uncle    Melanoma Maternal Uncle 68   Lung cancer Paternal Aunt 60       smoker   Diabetes Maternal Grandmother    Stroke Maternal Grandfather    Heart disease Paternal Grandfather    Thyroid cancer Cousin        maternal   Breast cancer Cousin 60       maternal   Breast cancer Cousin 44   Colon cancer Cousin     Social History   Socioeconomic History   Marital status: Married    Spouse name: Brett Canales   Number of children: 1   Years of education: 14   Highest education level: Not on file  Occupational History   Occupation: CMA   Tobacco Use   Smoking status: Never   Smokeless tobacco: Never  Vaping Use   Vaping status: Never Used  Substance and Sexual Activity   Alcohol use: Yes    Alcohol/week: 0.0 standard drinks of alcohol    Comment: rare, 1-2 drinks per year   Drug  use: No   Sexual activity: Yes    Partners: Male    Birth control/protection: Surgical    Comment: Hysterectomy   Other Topics Concern   Not on file  Social History Narrative   Lives in [place] with husband, no pets.  Works at [employer].      Diet - regular   Exercise - occasional   Social Determinants of Health   Financial Resource Strain: Low Risk  (11/23/2022)   Received from [facility], [facility]   Overall Financial Resource Strain (CARDIA)    Difficulty of Paying Living Expenses: Not hard at all  Food Insecurity: No Food Insecurity (11/23/2022)   Received from [facility], [facility]   Hunger Vital Sign    Worried About Running Out of Food in the Last Year: Never true    Ran Out of Food in the Last Year: Never true  Transportation Needs: No Transportation Needs (11/23/2022)   Received from [facility], [facility]   [facility] - Transportation    In the past 12 months, has lack of transportation kept you from medical appointments or from getting medications?: No    Lack of Transportation (Non-Medical): No  Physical Activity: Insufficiently Active (07/27/2017)   Exercise Vital Sign    Days of Exercise per Week: 4 days    Minutes of Exercise per Session: 30 min  Stress: No Stress Concern Present (07/27/2017)   [employer] of Occupational Health - Occupational Stress Questionnaire    Feeling of Stress : Only a little  Social Connections: Socially Integrated (07/27/2017)   Social Connection and Isolation Panel [NHANES]    Frequency of Communication with Friends and Family: More than three times a week    Frequency of  Social Gatherings with Friends and Family: Once a week    Attends Religious Services: More than 4 times per year    Active Member of [employer] or Organizations: Yes    Attends Engineer, structural: More than 4 times per year    Marital Status: Married  Catering manager Violence: Not At Risk (07/27/2017)   Humiliation, Afraid, Rape, and Kick questionnaire    Fear of Current or Ex-Partner: No    Emotionally Abused: No    Physically Abused: No    Sexually Abused: No     Current Outpatient Medications:    aspirin EC 81 MG tablet, Take by mouth., Disp: , Rfl:    b complex vitamins capsule, Take 1 capsule by mouth daily., Disp: , Rfl:    buPROPion (WELLBUTRIN XL) 150 MG 24 hr tablet, Take 1 tablet (150 mg total) by mouth daily. Take along a 300mg  tablet for a total daily dose of 450mg ., Disp: 90 tablet, Rfl: 3   buPROPion (WELLBUTRIN XL) 300 MG 24 hr tablet, Take 1 tablet (300 mg total) by mouth daily., Disp: 90 tablet, Rfl: 3   busPIRone (BUSPAR) 10 MG tablet, Take 1 tablet (10 mg total) by mouth 2 (two) times daily, Disp: 180 tablet, Rfl: 0   butalbital-acetaminophen-caffeine (FIORICET) 50-325-40 MG tablet, Take 1 tablet by mouth every 6 (six) hours as needed for headache., Disp: 30 tablet, Rfl: 0   calcium carbonate (OSCAL) 1500 (600 Ca) MG TABS tablet, Take by mouth 2 (two) times daily with a meal., Disp: , Rfl:    Cholecalciferol (VITAMIN D3) 10 MCG (400 UNIT) CAPS, Take by mouth., Disp: , Rfl:    citalopram (CELEXA) 40 MG  tablet, Take 1 tablet (40 mg total) by mouth daily., Disp: 90 tablet, Rfl: 3   diphenhydrAMINE (BENADRYL) 25 MG tablet, Take 25 mg by mouth at bedtime as needed for sleep. Pt states daily now for sleep and allergies., Disp: , Rfl:    elacestrant hydrochloride (ORSERDU) 345 MG tablet, Take 1 tablet (345 mg total) by mouth daily. Take with food., Disp: 30 tablet, Rfl: 1   gabapentin (NEURONTIN) 100 MG capsule, Take 1 capsule (100 mg total) by mouth every morning AND 2  capsules (200 mg total) every evening., Disp: 270 capsule, Rfl: 1   ketotifen (ZADITOR) 0.025 % ophthalmic solution, Place 1 drop into both eyes daily., Disp: , Rfl:    lisinopril (ZESTRIL) 10 MG tablet, Take 1 tablet (10 mg total) by mouth daily., Disp: 90 tablet, Rfl: 1   loperamide (IMODIUM) 2 MG capsule, Take 2 tabs by mouth with first loose stool, then 1 tab with each additional loose stool as needed. Do not exceed 8 tabs in a 24-hour period, Disp: 30 capsule, Rfl: 0   metFORMIN (GLUCOPHAGE-XR) 500 MG 24 hr tablet, Take 1 tablet (500 mg total) by mouth 2 (two) times daily with a meal., Disp: 180 tablet, Rfl: 1   morphine (MS CONTIN) 15 MG 12 hr tablet, Take 1 tablet (15 mg total) by mouth every 12 (twelve) hours., Disp: 60 tablet, Rfl: 0   Multiple Vitamin (MULTIVITAMIN) tablet, Take 1 tablet by mouth daily., Disp: , Rfl:    omeprazole (PRILOSEC) 20 MG capsule, Take 1 capsule (20 mg total) by mouth daily., Disp: 90 capsule, Rfl: 3   ondansetron (ZOFRAN) 4 MG tablet, Take 1 tablet (4 mg total) by mouth every 8 (eight) hours as needed for nausea or vomiting., Disp: 90 tablet, Rfl: 0   ondansetron (ZOFRAN) 8 MG tablet, Take 1 tablet (8 mg total) by mouth every 8 (eight) hours as needed for nausea or vomiting., Disp: 30 tablet, Rfl: 1   oxyCODONE-acetaminophen (PERCOCET/ROXICET) 5-325 MG tablet, Take 1 tablet by mouth every 6 (six) hours as needed for severe pain., Disp: 30 tablet, Rfl: 0   prochlorperazine (COMPAZINE) 10 MG tablet, Take 1 tablet (10 mg total) by mouth every 6 (six) hours as needed for nausea or vomiting., Disp: 30 tablet, Rfl: 1     ROS:  Review of Systems  Constitutional:  Negative for fatigue, fever and unexpected weight change.  Respiratory:  Negative for cough, shortness of breath and wheezing.   Cardiovascular:  Negative for chest pain, palpitations and leg swelling.  Gastrointestinal:  Negative for blood in stool, constipation, diarrhea, nausea and vomiting.   Endocrine: Negative for cold intolerance, heat intolerance and polyuria.  Genitourinary:  Positive for dyspareunia. Negative for dysuria, flank pain, frequency, genital sores, hematuria, menstrual problem, pelvic pain, urgency, vaginal bleeding, vaginal discharge and vaginal pain.  Musculoskeletal:  Positive for arthralgias. Negative for back pain, joint swelling and myalgias.  Skin:  Negative for rash.  Neurological:  Negative for dizziness, syncope, light-headedness, numbness and headaches.  Hematological:  Negative for adenopathy.  Psychiatric/Behavioral:  Negative for agitation, confusion, dysphoric mood, sleep disturbance and suicidal ideas. The patient is not nervous/anxious.    BREAST: No symptoms    Objective: BP 132/75   Ht 5\' 3"  (1.6 m)   Wt 260 lb (117.9 kg)   BMI 46.06 kg/m    Physical Exam Constitutional:      Appearance: She is well-developed.  Genitourinary:     Vulva normal.     Genitourinary Comments:  UTERUS SURG REM     Right Labia: No rash, tenderness or lesions.    Left Labia: No tenderness, lesions or rash.    Vaginal cuff intact.    No vaginal discharge, erythema or tenderness.     Moderate vaginal atrophy present.     Right Adnexa: not tender and no mass present.    Left Adnexa: not tender and no mass present.    No cervical friability or polyp.     Uterus is not enlarged or tender.     Uterus is absent.  Breasts:    Right: No mass, nipple discharge, skin change or tenderness.     Left: No mass, nipple discharge, skin change or tenderness.  Neck:     Thyroid: No thyromegaly.  Cardiovascular:     Rate and Rhythm: Normal rate and regular rhythm.     Heart sounds: Normal heart sounds. No murmur heard. Pulmonary:     Effort: Pulmonary effort is normal.     Breath sounds: Normal breath sounds.  Abdominal:     Palpations: Abdomen is soft.     Tenderness: There is no abdominal tenderness. There is no guarding or rebound.  Musculoskeletal:         General: Normal range of motion.     Cervical back: Normal range of motion.  Lymphadenopathy:     Cervical: No cervical adenopathy.  Neurological:     General: No focal deficit present.     Mental Status: She is alert and oriented to person, place, and time.     Cranial Nerves: No cranial nerve deficit.  Skin:    General: Skin is warm and dry.  Psychiatric:        Mood and Affect: Mood normal.        Behavior: Behavior normal.        Thought Content: Thought content normal.        Judgment: Judgment normal.  Vitals reviewed.     Assessment/Plan:  Encounter for annual routine gynecological examination  Cervical cancer screening - Plan: Cytology - PAP  Screening for HPV (human papillomavirus) - Plan: Cytology - PAP  Encounter for screening mammogram for malignant neoplasm of breast - Plan: MM 3D SCREENING MAMMOGRAM BILATERAL BREAST; pt to schedule mammo  CHEK2 gene mutation positive - Plan: MM 3D SCREENING MAMMOGRAM BILATERAL BREAST; cont breast cancer screening/tx with oncology. Colonoscopy Q5 yrs due next year.  Recurrent breast cancer, right (HCC) - Plan: MM 3D SCREENING MAMMOGRAM BILATERAL BREAST  Vaginal dryness, menopausal--can't have ERT. Discussed coconut oil as moisturizer and lubricant, Happy Cake silicone lubricant, and hyaluronic acid vag supp.          GYN counsel breast self exam, menopause, ca and Vit D.    F/U  Return in about 1 year (around 04/06/2024).  Syrianna Schillaci B. Maurya Nethery, PA-C 04/07/2023 5:13 PM

## 2023-04-07 NOTE — Patient Instructions (Signed)
I value your feedback and you entrusting us with your care. If you get a [facility] patient survey, I would appreciate you taking the time to let us know about your experience today. Thank you! ? ? ?

## 2023-04-07 NOTE — Telephone Encounter (Signed)
Patient verbalized understanding she might want to do the Hepatitis B with her PCP. She will call us back if she decides to do it through our office

## 2023-04-12 ENCOUNTER — Telehealth: Payer: Self-pay

## 2023-04-12 NOTE — Telephone Encounter (Signed)
Patient called stating she has been having dizzy spells since Sunday. Patient denies any other symptoms. Patient denies passing out. Patient states she has started on three new prescription within the last two weeks which are Elacestrant Hydrochloride 345 mg, Buspirone 10 mg, and Morphine 15 mg.

## 2023-04-13 ENCOUNTER — Encounter: Payer: Self-pay | Admitting: *Deleted

## 2023-04-13 ENCOUNTER — Telehealth: Payer: Self-pay | Admitting: *Deleted

## 2023-04-13 ENCOUNTER — Other Ambulatory Visit: Payer: Self-pay | Admitting: Pharmacist

## 2023-04-13 DIAGNOSIS — C50911 Malignant neoplasm of unspecified site of right female breast: Secondary | ICD-10-CM

## 2023-04-13 LAB — CYTOLOGY - PAP: Diagnosis: NEGATIVE

## 2023-04-13 MED ORDER — ELACESTRANT HYDROCHLORIDE 86 MG PO TABS
258.0000 mg | ORAL_TABLET | Freq: Every day | ORAL | 0 refills | Status: DC
Start: 2023-04-13 — End: 2023-05-05

## 2023-04-13 NOTE — Telephone Encounter (Signed)
Patient has taken her Orserdu Oceanographer) for today, she will start holding her elacestrant tomorrow 04/14/23. She knows if her symptoms improve in the next 3-5 days to let us know then resume the elacestrant at the next dose level down 258 mg (three 86 mg tablets) once daily. Rx for new dose sent to [facility].   Of note, patient recently started buspirone and morphine. She also recently stopped her diltiazem, but patient has not noticed an increase in her heart rate. She will continue to keep an eye on her heart rate.

## 2023-04-13 NOTE — Telephone Encounter (Signed)
Pt is requesting a work note for the week of September 30 - April 16, 2023.

## 2023-04-13 NOTE — Telephone Encounter (Signed)
Cordelia Pen can you call her and give her the note?

## 2023-04-13 NOTE — Telephone Encounter (Signed)
RN returned patient message stating that she was still having dizziness and had not received call back.   Pt is concerned that she started on new medications as documented in telephone note on 04/12/23.  See note below.   Patient called stating she has been having dizzy spells since Sunday. Patient denies any other symptoms. Patient denies passing out. Patient states she has started on three new prescription within the last two weeks which are Elacestrant Hydrochloride 345 mg, Buspirone 10 mg, and Morphine 15 mg.     Pt states today 04/13/23 that dizziness is worse.  RN had pt take BP and it was 117/78. Encouraged pt to make sure she is well hydrated and nurse would notify Dr Smith Robert and team and someone would call her back.  RN instructed if symptoms worsened to go to urgent care or ED.  Pt verbalized understanding.

## 2023-04-13 NOTE — Telephone Encounter (Signed)
Can you call her? Hold the drug for 3-5 days and see if symptoms improve. We can send her the next lower dose if that's better tolerated

## 2023-04-14 ENCOUNTER — Encounter: Payer: 59 | Admitting: Licensed Clinical Social Worker

## 2023-04-18 ENCOUNTER — Ambulatory Visit: Payer: 59 | Admitting: Oncology

## 2023-04-19 DIAGNOSIS — M7918 Myalgia, other site: Secondary | ICD-10-CM | POA: Diagnosis not present

## 2023-04-19 DIAGNOSIS — M542 Cervicalgia: Secondary | ICD-10-CM | POA: Diagnosis not present

## 2023-04-19 DIAGNOSIS — R519 Headache, unspecified: Secondary | ICD-10-CM | POA: Diagnosis not present

## 2023-04-19 DIAGNOSIS — M9905 Segmental and somatic dysfunction of pelvic region: Secondary | ICD-10-CM | POA: Diagnosis not present

## 2023-04-19 DIAGNOSIS — M9903 Segmental and somatic dysfunction of lumbar region: Secondary | ICD-10-CM | POA: Diagnosis not present

## 2023-04-19 DIAGNOSIS — M9901 Segmental and somatic dysfunction of cervical region: Secondary | ICD-10-CM | POA: Diagnosis not present

## 2023-04-19 DIAGNOSIS — M25552 Pain in left hip: Secondary | ICD-10-CM | POA: Diagnosis not present

## 2023-04-19 DIAGNOSIS — M9904 Segmental and somatic dysfunction of sacral region: Secondary | ICD-10-CM | POA: Diagnosis not present

## 2023-04-19 DIAGNOSIS — M5451 Vertebrogenic low back pain: Secondary | ICD-10-CM | POA: Diagnosis not present

## 2023-04-20 ENCOUNTER — Encounter: Payer: Self-pay | Admitting: Oncology

## 2023-04-26 ENCOUNTER — Other Ambulatory Visit: Payer: 59

## 2023-04-26 ENCOUNTER — Encounter: Payer: 59 | Admitting: Licensed Clinical Social Worker

## 2023-04-29 ENCOUNTER — Inpatient Hospital Stay: Payer: 59 | Attending: Oncology

## 2023-04-29 ENCOUNTER — Inpatient Hospital Stay (HOSPITAL_BASED_OUTPATIENT_CLINIC_OR_DEPARTMENT_OTHER): Payer: 59 | Admitting: Oncology

## 2023-04-29 ENCOUNTER — Encounter: Payer: Self-pay | Admitting: Oncology

## 2023-04-29 VITALS — BP 125/69 | HR 78 | Temp 99.0°F | Resp 18 | Ht 63.0 in | Wt 263.8 lb

## 2023-04-29 DIAGNOSIS — C7951 Secondary malignant neoplasm of bone: Secondary | ICD-10-CM | POA: Diagnosis not present

## 2023-04-29 DIAGNOSIS — Z853 Personal history of malignant neoplasm of breast: Secondary | ICD-10-CM | POA: Insufficient documentation

## 2023-04-29 DIAGNOSIS — Z79899 Other long term (current) drug therapy: Secondary | ICD-10-CM

## 2023-04-29 DIAGNOSIS — G893 Neoplasm related pain (acute) (chronic): Secondary | ICD-10-CM | POA: Diagnosis not present

## 2023-04-29 DIAGNOSIS — Z170 Estrogen receptor positive status [ER+]: Secondary | ICD-10-CM | POA: Diagnosis not present

## 2023-04-29 DIAGNOSIS — C50911 Malignant neoplasm of unspecified site of right female breast: Secondary | ICD-10-CM

## 2023-04-29 LAB — COMPREHENSIVE METABOLIC PANEL
ALT: 29 U/L (ref 0–44)
AST: 30 U/L (ref 15–41)
Albumin: 3.5 g/dL (ref 3.5–5.0)
Alkaline Phosphatase: 101 U/L (ref 38–126)
Anion gap: 9 (ref 5–15)
BUN: 13 mg/dL (ref 6–20)
CO2: 23 mmol/L (ref 22–32)
Calcium: 8.7 mg/dL — ABNORMAL LOW (ref 8.9–10.3)
Chloride: 107 mmol/L (ref 98–111)
Creatinine, Ser: 1.09 mg/dL — ABNORMAL HIGH (ref 0.44–1.00)
GFR, Estimated: 58 mL/min — ABNORMAL LOW (ref >60)
Glucose, Bld: 130 mg/dL — ABNORMAL HIGH (ref 70–99)
Potassium: 4.2 mmol/L (ref 3.5–5.1)
Sodium: 139 mmol/L (ref 135–145)
Total Bilirubin: 0.5 mg/dL (ref 0.3–1.2)
Total Protein: 6.5 g/dL (ref 6.5–8.1)

## 2023-04-29 LAB — LIPID PANEL
Cholesterol: 161 mg/dL (ref 0–200)
HDL: 48 mg/dL (ref >40)
LDL Cholesterol: 76 mg/dL (ref 0–99)
Total CHOL/HDL Ratio: 3.4 {ratio}
Triglycerides: 184 mg/dL — ABNORMAL HIGH (ref <150)
VLDL: 37 mg/dL (ref 0–40)

## 2023-04-29 LAB — CBC WITH DIFFERENTIAL/PLATELET
Abs Immature Granulocytes: 0.04 10*3/uL (ref 0.00–0.07)
Basophils Absolute: 0 10*3/uL (ref 0.0–0.1)
Basophils Relative: 1 %
Eosinophils Absolute: 0.2 10*3/uL (ref 0.0–0.5)
Eosinophils Relative: 4 %
HCT: 35.9 % — ABNORMAL LOW (ref 36.0–46.0)
Hemoglobin: 10.6 g/dL — ABNORMAL LOW (ref 12.0–15.0)
Immature Granulocytes: 1 %
Lymphocytes Relative: 24 %
Lymphs Abs: 1 10*3/uL (ref 0.7–4.0)
MCH: 27.7 pg (ref 26.0–34.0)
MCHC: 29.5 g/dL — ABNORMAL LOW (ref 30.0–36.0)
MCV: 93.7 fL (ref 80.0–100.0)
Monocytes Absolute: 0.5 10*3/uL (ref 0.1–1.0)
Monocytes Relative: 11 %
Neutro Abs: 2.6 10*3/uL (ref 1.7–7.7)
Neutrophils Relative %: 59 %
Platelets: 172 10*3/uL (ref 150–400)
RBC: 3.83 MIL/uL — ABNORMAL LOW (ref 3.87–5.11)
RDW: 18.2 % — ABNORMAL HIGH (ref 11.5–15.5)
WBC: 4.4 10*3/uL (ref 4.0–10.5)
nRBC: 0 % (ref 0.0–0.2)

## 2023-04-29 NOTE — Progress Notes (Signed)
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  It was a 1.4 cm tumor with negative lymph nodes grade 1 and patient went on to get lumpectomy followed by adjuvant radiation therapy.  Oncotype DX score was 6 and she did not require adjuvant chemotherapy.  She was on tamoxifen for 5 years.  She had BRCA testing done at that time which was negative.   Patient felt a lump in her right axilla in September 2021.  She underwent ultrasound of bilateral breasts which did not pick up any axillary mass.  A prior screening mammogram in September 2021 was unremarkable.  She was then admitted for Covid pneumonia and underwent CT angio chest on 07/22/2020 which incidentally picked up an axillary mass measuring 2.5 cm.  No obvious breast mass.  Axillary mass was biopsied and was consistent with high-grade invasive mammary carcinoma.  IHC was positive for GATA3 with diffuse strong nuclear staining.  There was no lymph node architecture identified in the sample and it could not be determined if it is a primary tumor within the axillary breast tissue or metastases.  ER strongly positive greater than 90%, PR 1 to 10% positive and HER-2 negative   PET CT scan showed hypermetabolic poorly marginated solid right axillary mass 2.5 x 2 cm with an SUV of 5.6.  No  enlarged hypermetabolic mediastinal or hilar lymph nodes no enlarged left axillary lymph nodes.  Subcentimeter lung nodules below PET resolution.  Small hypermetabolism in the right liver with an SUV of 6 without CT correlate equivocal for liver metastases.  Multiple hypermetabolic faintly lytic osseous lesions throughout the lumbar spine and bilateral pelvic girdle with representative supra-acetabular right iliac bone 1.9 cm lesion, anterior superior left acetabular 1.7 cm lesion and L1 1.2 cm lesion.   NGS testing showed no actionable mutations.  PD-L1 less than 1%.  CHEK2 S422 FS, PIK3CA N1068 FS, PIK3CA N345H, CCN E1 gain, ERB B2 1 P-CSF 1R fusion, FGF 19 gain, FGF 3 gain, FGF 4gain.   Ibrance plus letrozole started in February 2022.    Patient noted to have increase in CA 27-29 from 26 and November 2023 203 in July 2024.  CT chest abdomen pelvis with contrast and bone scan showed worsening metastatic disease in the bones especially in the thoracolumbar spine and right hemipelvis.  Plan is therefore to switch her to Verzenio plus fulvestrant   Peripheral blood NGS testing showed CHEK2 mutation, PIK3CA P.N1068 FS, PIK3CA P.N345H, PTEN P.R173S, ESR 1P.D538G, ESR 1P.L536R, PIK3CA P.R88Q.  MSI high not detected  Elacestrant started in sept 2024  Interval history-patient is tolerating Elacestrant well without any significant side effects.  She reports right-sided chest wall pain since this morning especially when she moves or turns.  Hip pain has improved.  She is only taking long-acting morphine but has not taken as needed oxycodone.  She uses occasional Tylenol if  needed.  She reports feelings of anxiety since her disease progression.  ECOG PS- 1 Pain scale- 4 Opioid associated constipation- no  Review of systems- Review of Systems  Constitutional:  Positive for malaise/fatigue.  Respiratory:         The right side chest wall pain  Psychiatric/Behavioral:  The patient is nervous/anxious.        Allergies  Allergen Reactions   Kiwi Extract Itching and Swelling    The real kiwi fruit   Codeine Other (See Comments)    Felt funny.   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   Chicken pox    Depression    Fatty liver    Fatty liver    Gastric polyps    GERD (gastroesophageal reflux disease)    Gestational diabetes    prediabetes out side of having baby   Headache    migraines   Heart murmur    History of abnormal mammogram 08/2006, 01/2008, 03/2009   Hypertension    IBS (irritable bowel syndrome)    Joint disorder    hx of left ankle tendonitis, bursitis, heel spur   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 Smith/ laprascopic surgery   COLONOSCOPY  04/04/2000   COLONOSCOPY WITH PROPOFOL N/A 02/12/2019   Procedure: COLONOSCOPY  WITH PROPOFOL;  Surgeon: Christena Deem, MD;  Location[facility] ENDOSCOPY;  Service: Endoscopy;  Laterality: N/A;   ESOPHAGOGASTRODUODENOSCOPY (EGD) WITH PROPOFOL N/A 05/01/2015   Procedure: ESOPHAGOGASTRODUODENOSCOPY (EGD) WITH PROPOFOL;  Surgeon: Wallace Cullens, MD;  Location: [facility] ENDOSCOPY;  Service: Gastroenterology;  Laterality: N/A;   ESOPHAGOGASTRODUODENOSCOPY (EGD) WITH PROPOFOL N/A 02/12/2019   Procedure: ESOPHAGOGASTRODUODENOSCOPY (EGD) WITH PROPOFOL;  Surgeon: Christena Deem, MD;  Location: [facility] ENDOSCOPY;  Service: Endoscopy;  Laterality: N/A;   EYE SURGERY  08/2012   laser surgery on retina/ DR Appenzeller   FINGER SURGERY     repair of tendon in right index, Dr. Benay Pillow in [facility]   FOOT SURGERY     Dr. Al Corpus   IRRIGATION AND DEBRIDEMENT SEBACEOUS CYST     right upper back  LAPAROSCOPIC SUPRACERVICAL HYSTERECTOMY  06/2007   Dr Kincius/ adhesions/CPP/adenomyosis   MASTECTOMY PARTIAL / LUMPECTOMY Right 01/2008   with sentinal lymph node biopsy/ infiltrating ductal cancer   REPAIR PERONEAL TENDONS ANKLE  10/2019   with tarsal exostectomy and sural neuroma excision on right    SKIN CANCER EXCISION     back and calves   WISDOM TOOTH EXTRACTION  1991    Social History   Socioeconomic History   Marital status: Married    Spouse name: Brett Canales   Number of children: 1   Years of education: 14   Highest education level: Not on file  Occupational History   Occupation: CMA  Tobacco Use   Smoking status: Never   Smokeless tobacco: Never  Vaping Use   Vaping status: Never Used  Substance and Sexual Activity   Alcohol use: Yes    Alcohol/week: 0.0 standard drinks of alcohol    Comment: rare, 1-2 drinks per year   Drug use: No   Sexual activity: Yes    Partners: Male    Birth control/protection: Surgical    Comment: Hysterectomy   Other Topics Concern   Not on file  Social History Narrative   Lives in [place] with husband, no pets.  Works at [employer].      Diet -  regular   Exercise - occasional   Social Determinants of Health   Financial Resource Strain: Low Risk  (11/23/2022)   Received [facility], [facility]   Overall Financial Resource Strain (CARDIA)    Difficulty of Paying Living Expenses: Not hard at all  Food Insecurity: No Food Insecurity (11/23/2022)   Received from [facility], [facility]   Hunger Vital Sign    Worried About Running Out of Food in the Last Year: Never true    Ran Out of Food in the Last Year: Never true  Transportation Needs: No Transportation Needs (11/23/2022)   Received from [facility], [facility]   [facility] - Transportation    In the past 12 months, has lack of transportation kept you from medical appointments or from getting medications?: No    Lack of Transportation (Non-Medical): No  Physical Activity: Insufficiently Active (07/27/2017)   Exercise Vital Sign    Days of Exercise per Week: 4 days    Minutes of Exercise per Session: 30 min  Stress: No Stress Concern Present (07/27/2017)   [employer] of Occupational Health - Occupational Stress Questionnaire    Feeling of Stress : Only a little  Social Connections: Socially Integrated (07/27/2017)   Social Connection and Isolation Panel [NHANES]    Frequency of Communication with Friends and Family: More than three times a week    Frequency of Social Gatherings with Friends and Family: Once a week    Attends Religious Services: More than 4 times per year    Active Member of [employer] or Organizations: Yes    Attends Engineer, structural: More than 4 times per year    Marital Status: Married  Catering manager Violence: Not At Risk (07/27/2017)   Humiliation, Afraid, Rape, and Kick questionnaire    Fear of Current or Ex-Partner: No    Emotionally Abused: No    Physically Abused: No    Sexually Abused: No    Family History  Problem  Relation Age of Onset   Hypertension Mother    Thyroid disease Mother    Breast cancer Mother  43   Colon cancer Mother 33       again at 32   Atrial fibrillation Mother    Hip fracture Mother    Skin cancer Mother    Stroke Father    Hypertension Father    Cancer Father 68       prostate   Parkinson's disease Father    Skin cancer Father    Atrial fibrillation Sister    Hypertension Sister    Thyroid disease Sister    Cancer Sister        skin/ squamous cell   Heart Problems Sister    Diabetes Maternal Grandmother    Stroke Maternal Grandfather    Heart disease Paternal Grandfather    Diabetes Maternal Aunt    Cancer Maternal Aunt    Breast cancer Maternal Aunt 45   Lymphoma Maternal Uncle    Heart disease Maternal Uncle    Melanoma Maternal Uncle 33   Lung cancer Paternal Aunt 24       smoker   Thyroid cancer Cousin        maternal   Breast cancer Cousin 78       maternal   Breast cancer Cousin 1   Colon cancer Cousin      Current Outpatient Medications:    aspirin EC 81 MG tablet, Take by mouth., Disp: , Rfl:    b complex vitamins capsule, Take 1 capsule by mouth daily., Disp: , Rfl:    buPROPion (WELLBUTRIN XL) 150 MG 24 hr tablet, Take 1 tablet (150 mg total) by mouth daily. Take along a 300mg  tablet for a total daily dose of 450mg ., Disp: 90 tablet, Rfl: 3   buPROPion (WELLBUTRIN XL) 300 MG 24 hr tablet, Take 1 tablet (300 mg total) by mouth daily., Disp: 90 tablet, Rfl: 3   busPIRone (BUSPAR) 10 MG tablet, Take 1 tablet (10 mg total) by mouth 2 (two) times daily, Disp: 180 tablet, Rfl: 0   butalbital-acetaminophen-caffeine (FIORICET) 50-325-40 MG tablet, Take 1 tablet by mouth every 6 (six) hours as needed for headache., Disp: 30 tablet, Rfl: 0   calcium carbonate (OSCAL) 1500 (600 Ca) MG TABS tablet, Take by mouth 2 (two) times daily with a meal., Disp: , Rfl:    Cholecalciferol (VITAMIN D3) 10 MCG (400 UNIT) CAPS, Take by mouth., Disp: , Rfl:    citalopram  (CELEXA) 40 MG tablet, Take 1 tablet (40 mg total) by mouth daily., Disp: 90 tablet, Rfl: 3   diphenhydrAMINE (BENADRYL) 25 MG tablet, Take 25 mg by mouth at bedtime as needed for sleep. Pt states daily now for sleep and allergies., Disp: , Rfl:    elacestrant hydrochloride (ORSERDU) 86 MG tablet, Take 3 tablets (258 mg total) by mouth daily. Take with food., Disp: 90 tablet, Rfl: 0   gabapentin (NEURONTIN) 100 MG capsule, Take 1 capsule (100 mg total) by mouth every morning AND 2 capsules (200 mg total) every evening., Disp: 270 capsule, Rfl: 1   ketotifen (ZADITOR) 0.025 % ophthalmic solution, Place 1 drop into both eyes daily., Disp: , Rfl:    lisinopril (ZESTRIL) 10 MG tablet, Take 1 tablet (10 mg total) by mouth daily., Disp: 90 tablet, Rfl: 1   loperamide (IMODIUM) 2 MG capsule, Take 2 tabs by mouth with first loose stool, then 1 tab with each additional loose stool as needed. Do not exceed 8 tabs in a 24-hour period, Disp: 30 capsule, Rfl: 0   metFORMIN (GLUCOPHAGE-XR) 500 MG 24 hr tablet, Take  1 tablet (500 mg total) by mouth 2 (two) times daily with a meal., Disp: 180 tablet, Rfl: 1   morphine (MS CONTIN) 15 MG 12 hr tablet, Take 1 tablet (15 mg total) by mouth every 12 (twelve) hours., Disp: 60 tablet, Rfl: 0   Multiple Vitamin (MULTIVITAMIN) tablet, Take 1 tablet by mouth daily., Disp: , Rfl:    omeprazole (PRILOSEC) 20 MG capsule, Take 1 capsule (20 mg total) by mouth daily., Disp: 90 capsule, Rfl: 3   ondansetron (ZOFRAN) 4 MG tablet, Take 1 tablet (4 mg total) by mouth every 8 (eight) hours as needed for nausea or vomiting., Disp: 90 tablet, Rfl: 0   ondansetron (ZOFRAN) 8 MG tablet, Take 1 tablet (8 mg total) by mouth every 8 (eight) hours as needed for nausea or vomiting., Disp: 30 tablet, Rfl: 1   oxyCODONE-acetaminophen (PERCOCET/ROXICET) 5-325 MG tablet, Take 1 tablet by mouth every 6 (six) hours as needed for severe pain., Disp: 30 tablet, Rfl: 0   prochlorperazine (COMPAZINE) 10 MG  tablet, Take 1 tablet (10 mg total) by mouth every 6 (six) hours as needed for nausea or vomiting., Disp: 30 tablet, Rfl: 1  Physical exam:  Vitals:   04/29/23 1018  BP: 125/69  Pulse: 78  Resp: 18  Temp: 99 F (37.2 C)  TempSrc: Tympanic  SpO2: 97%  Weight: 263 lb 12.8 oz (119.7 kg)  Height: 5\' 3"  (1.6 m)   Physical Exam Cardiovascular:     Rate and Rhythm: Normal rate and regular rhythm.     Heart sounds: Normal heart sounds.  Pulmonary:     Effort: Pulmonary effort is normal.     Breath sounds: Normal breath sounds.     Comments: Tenderness to palpation around the eighth and ninth rib laterally near the midaxillary line Abdominal:     General: Bowel sounds are normal.     Palpations: Abdomen is soft.  Skin:    General: Skin is warm and dry.  Neurological:     Mental Status: She is alert and oriented to person, place, and time.         Latest Ref Rng & Units 04/29/2023    9:40 AM  CMP  Glucose 70 - 99 mg/dL 811   BUN 6 - 20 mg/dL 13   Creatinine 9.14 - 1.00 mg/dL 7.82   Sodium 956 - 213 mmol/L 139   Potassium 3.5 - 5.1 mmol/L 4.2   Chloride 98 - 111 mmol/L 107   CO2 22 - 32 mmol/L 23   Calcium 8.9 - 10.3 mg/dL 8.7   Total Protein 6.5 - 8.1 g/dL 6.5   Total Bilirubin 0.3 - 1.2 mg/dL 0.5   Alkaline Phos 38 - 126 U/L 101   AST 15 - 41 U/L 30   ALT 0 - 44 U/L 29       Latest Ref Rng & Units 04/29/2023    9:40 AM  CBC  WBC 4.0 - 10.5 K/uL 4.4   Hemoglobin 12.0 - 15.0 g/dL 08.6   Hematocrit 57.8 - 46.0 % 35.9   Platelets 150 - 400 K/uL 172     No images are attached to the encounter.  No results found.   Assessment and plan- Patient is a 62 y.o. female with history of metastatic ER positive breast cancer with bone metastases currently on Elacestrant here for routine follow-up  Patient is tolerating medication relatively well without any significant side effects.  Mild decrease in her hemoglobin but otherwise her CBC and  CMP are unremarkable.  CA 27-29  is pending and will continue to trend that on a monthly basis.  I will see her back in 1 month with labs.  Lipid panel shows a normal HDL and LDLAnd mildly elevated triglycerides which we will continue to monitor  Neoplasm related pain: Right-sided chest wall pain could be secondary to rib metastases versus musculoskeletal.  She will continue with long-acting morphine and as needed Tylenol and oxycodone.  If pain does not improve despite that we could consider SBRT to that area  I did discuss referral to schedule a care given her symptoms of anxiety and depression after her recent disease progression.  She would like to hold off on that for now.  She is currently taking Wellbutrin daily.  I will see her back in 1 month with labs and I will restart Zometa given her increase in bone metastases   Visit Diagnosis 1. 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 High risk medication use   3. Pain from bone metastases (HCC)   4. Neoplasm related pain      Dr. Owens Shark, MD, MPH [facility] [phone] 04/29/2023 12:50 PM

## 2023-04-30 ENCOUNTER — Other Ambulatory Visit: Payer: Self-pay

## 2023-04-30 LAB — CANCER ANTIGEN 27.29: CA 27.29: 185.1 U/mL — ABNORMAL HIGH (ref 0.0–38.6)

## 2023-05-02 ENCOUNTER — Other Ambulatory Visit: Payer: Self-pay

## 2023-05-02 ENCOUNTER — Encounter: Payer: Self-pay | Admitting: Oncology

## 2023-05-02 ENCOUNTER — Telehealth: Payer: Self-pay | Admitting: *Deleted

## 2023-05-02 DIAGNOSIS — C50911 Malignant neoplasm of unspecified site of right female breast: Secondary | ICD-10-CM

## 2023-05-02 NOTE — Telephone Encounter (Signed)
Patient called reporting that she is scheduled for a CT 10/25 and is asking if this is an old order and if not she states that she usually gets a PET scan with it. Please advise

## 2023-05-02 NOTE — Telephone Encounter (Signed)
She had a pet in early September. Lets plan to do CT and bone scan towards end of November. Please let her know tahts what she has been getting pet. We do not typically use pet all the time for surveillance

## 2023-05-02 NOTE — Telephone Encounter (Signed)
Called informed patient. Can you cancel CT for [facility] 10/25 and rescheduled for end of Nov. Patient says she can only do Mondays and Fridays and will see in [place]. Thanks

## 2023-05-03 ENCOUNTER — Encounter: Payer: Self-pay | Admitting: Oncology

## 2023-05-03 ENCOUNTER — Other Ambulatory Visit: Payer: Self-pay

## 2023-05-04 ENCOUNTER — Other Ambulatory Visit: Payer: Self-pay | Admitting: Oncology

## 2023-05-04 DIAGNOSIS — C50911 Malignant neoplasm of unspecified site of right female breast: Secondary | ICD-10-CM

## 2023-05-05 ENCOUNTER — Other Ambulatory Visit: Payer: Self-pay | Admitting: *Deleted

## 2023-05-05 ENCOUNTER — Encounter: Payer: Self-pay | Admitting: Oncology

## 2023-05-05 ENCOUNTER — Other Ambulatory Visit: Payer: Self-pay

## 2023-05-05 ENCOUNTER — Other Ambulatory Visit: Payer: Self-pay | Admitting: Oncology

## 2023-05-05 ENCOUNTER — Other Ambulatory Visit (HOSPITAL_COMMUNITY): Payer: Self-pay

## 2023-05-05 DIAGNOSIS — C50911 Malignant neoplasm of unspecified site of right female breast: Secondary | ICD-10-CM

## 2023-05-05 MED ORDER — BUSPIRONE HCL 10 MG PO TABS
10.0000 mg | ORAL_TABLET | Freq: Two times a day (BID) | ORAL | 1 refills | Status: DC
  Filled 2023-05-05 – 2023-06-29 (×2): qty 180, 90d supply, fill #0
  Filled 2023-09-27: qty 60, 30d supply, fill #1
  Filled 2023-10-27: qty 60, 30d supply, fill #2
  Filled 2023-11-19 – 2023-11-21 (×2): qty 60, 30d supply, fill #3

## 2023-05-06 ENCOUNTER — Ambulatory Visit: Payer: 59

## 2023-05-06 ENCOUNTER — Other Ambulatory Visit: Payer: 59

## 2023-05-08 ENCOUNTER — Encounter: Payer: Self-pay | Admitting: Oncology

## 2023-05-09 ENCOUNTER — Telehealth: Payer: Self-pay | Admitting: *Deleted

## 2023-05-09 NOTE — Telephone Encounter (Signed)
Pharmacy called asking for return call to clarify dosing discrepancy is it three times a day or daily. Please return there call

## 2023-05-11 ENCOUNTER — Encounter: Payer: Self-pay | Admitting: Oncology

## 2023-05-11 ENCOUNTER — Other Ambulatory Visit: Payer: Self-pay | Admitting: *Deleted

## 2023-05-11 DIAGNOSIS — C50911 Malignant neoplasm of unspecified site of right female breast: Secondary | ICD-10-CM

## 2023-05-11 MED ORDER — ELACESTRANT HYDROCHLORIDE 86 MG PO TABS: ORAL_TABLET | ORAL | 1 refills | Status: DC

## 2023-05-13 ENCOUNTER — Ambulatory Visit: Payer: 59 | Admitting: Oncology

## 2023-05-13 ENCOUNTER — Other Ambulatory Visit: Payer: 59

## 2023-05-13 ENCOUNTER — Telehealth: Payer: Self-pay | Admitting: *Deleted

## 2023-05-13 NOTE — Telephone Encounter (Signed)
Continue supportive treatment and if she continues to have fever over the weekend she should go to ER

## 2023-05-13 NOTE — Telephone Encounter (Signed)
Patient called to report that she had her Flu shot yesterday and today, she is aching all over and has a temp of 101. She states that she took Tylenol and just wanted to let Dr Smith Robert know what is going on.

## 2023-05-17 ENCOUNTER — Other Ambulatory Visit: Payer: Self-pay | Admitting: Oncology

## 2023-05-17 MED ORDER — MORPHINE SULFATE ER 15 MG PO TBCR: 15.0000 mg | EXTENDED_RELEASE_TABLET | Freq: Two times a day (BID) | ORAL | 0 refills | Status: DC

## 2023-05-17 MED ORDER — OXYCODONE-ACETAMINOPHEN 5-325 MG PO TABS: 1.0000 | ORAL_TABLET | Freq: Four times a day (QID) | ORAL | 0 refills | Status: DC | PRN

## 2023-05-18 ENCOUNTER — Encounter: Payer: Self-pay | Admitting: Oncology

## 2023-05-18 NOTE — Telephone Encounter (Signed)
Attempted top call patient back to see how she is feeling. Call went to voice mail.

## 2023-05-19 ENCOUNTER — Telehealth: Payer: Self-pay

## 2023-05-19 NOTE — Telephone Encounter (Signed)
Called patient to follow up on symptoms after reviewing triage nurse phone note.   Patient states she is feeling better and no longer has a fever.

## 2023-05-24 DIAGNOSIS — R519 Headache, unspecified: Secondary | ICD-10-CM | POA: Diagnosis not present

## 2023-05-24 DIAGNOSIS — E1159 Type 2 diabetes mellitus with other circulatory complications: Secondary | ICD-10-CM | POA: Diagnosis not present

## 2023-05-24 DIAGNOSIS — M9901 Segmental and somatic dysfunction of cervical region: Secondary | ICD-10-CM | POA: Diagnosis not present

## 2023-05-24 DIAGNOSIS — M9904 Segmental and somatic dysfunction of sacral region: Secondary | ICD-10-CM | POA: Diagnosis not present

## 2023-05-24 DIAGNOSIS — M542 Cervicalgia: Secondary | ICD-10-CM | POA: Diagnosis not present

## 2023-05-24 DIAGNOSIS — M9905 Segmental and somatic dysfunction of pelvic region: Secondary | ICD-10-CM | POA: Diagnosis not present

## 2023-05-24 DIAGNOSIS — Z79899 Other long term (current) drug therapy: Secondary | ICD-10-CM | POA: Diagnosis not present

## 2023-05-24 DIAGNOSIS — Z7984 Long term (current) use of oral hypoglycemic drugs: Secondary | ICD-10-CM | POA: Diagnosis not present

## 2023-05-24 DIAGNOSIS — M25552 Pain in left hip: Secondary | ICD-10-CM | POA: Diagnosis not present

## 2023-05-24 DIAGNOSIS — M9903 Segmental and somatic dysfunction of lumbar region: Secondary | ICD-10-CM | POA: Diagnosis not present

## 2023-05-24 DIAGNOSIS — M5451 Vertebrogenic low back pain: Secondary | ICD-10-CM | POA: Diagnosis not present

## 2023-05-24 DIAGNOSIS — M7918 Myalgia, other site: Secondary | ICD-10-CM | POA: Diagnosis not present

## 2023-05-24 DIAGNOSIS — I152 Hypertension secondary to endocrine disorders: Secondary | ICD-10-CM | POA: Diagnosis not present

## 2023-05-26 ENCOUNTER — Ambulatory Visit
Admission: RE | Admit: 2023-05-26 | Discharge: 2023-05-26 | Disposition: A | Discharge status: Home / Self Care | Payer: 59 | Source: Ambulatory Visit | Attending: Obstetrics and Gynecology | Admitting: Obstetrics and Gynecology

## 2023-05-26 DIAGNOSIS — Z1231 Encounter for screening mammogram for malignant neoplasm of breast: Secondary | ICD-10-CM | POA: Insufficient documentation

## 2023-05-26 DIAGNOSIS — C50911 Malignant neoplasm of unspecified site of right female breast: Secondary | ICD-10-CM | POA: Insufficient documentation

## 2023-05-26 DIAGNOSIS — Z1589 Genetic susceptibility to other disease: Secondary | ICD-10-CM | POA: Diagnosis not present

## 2023-05-27 ENCOUNTER — Ambulatory Visit: Payer: 59 | Admitting: Oncology

## 2023-05-27 ENCOUNTER — Other Ambulatory Visit: Payer: 59

## 2023-05-27 ENCOUNTER — Ambulatory Visit: Payer: 59

## 2023-06-02 ENCOUNTER — Encounter: Payer: Self-pay | Admitting: *Deleted

## 2023-06-06 ENCOUNTER — Ambulatory Visit: Payer: 59

## 2023-06-06 ENCOUNTER — Ambulatory Visit: Admission: RE | Admit: 2023-06-06 | Payer: 59 | Source: Ambulatory Visit

## 2023-06-11 ENCOUNTER — Other Ambulatory Visit (HOSPITAL_COMMUNITY): Payer: Self-pay

## 2023-06-13 ENCOUNTER — Other Ambulatory Visit (HOSPITAL_COMMUNITY): Payer: Self-pay

## 2023-06-13 MED ORDER — GABAPENTIN 100 MG PO CAPS
ORAL_CAPSULE | ORAL | 1 refills | Status: DC
  Filled 2023-06-21: qty 270, 90d supply, fill #0
  Filled 2023-09-19: qty 270, 90d supply, fill #1

## 2023-06-13 MED ORDER — METFORMIN HCL ER 500 MG PO TB24
500.0000 mg | ORAL_TABLET | Freq: Two times a day (BID) | ORAL | 1 refills | Status: DC
  Filled 2023-06-21: qty 180, 90d supply, fill #0
  Filled 2023-09-19: qty 180, 90d supply, fill #1

## 2023-06-14 ENCOUNTER — Other Ambulatory Visit: Payer: Self-pay

## 2023-06-16 ENCOUNTER — Ambulatory Visit
Admission: RE | Admit: 2023-06-16 | Discharge: 2023-06-16 | Disposition: A | Discharge status: Home / Self Care | Payer: 59 | Source: Ambulatory Visit | Attending: Oncology | Admitting: Oncology

## 2023-06-16 DIAGNOSIS — D1771 Benign lipomatous neoplasm of kidney: Secondary | ICD-10-CM | POA: Diagnosis not present

## 2023-06-16 DIAGNOSIS — C50919 Malignant neoplasm of unspecified site of unspecified female breast: Secondary | ICD-10-CM | POA: Insufficient documentation

## 2023-06-16 DIAGNOSIS — C50911 Malignant neoplasm of unspecified site of right female breast: Secondary | ICD-10-CM | POA: Insufficient documentation

## 2023-06-16 DIAGNOSIS — K5730 Diverticulosis of large intestine without perforation or abscess without bleeding: Secondary | ICD-10-CM | POA: Diagnosis not present

## 2023-06-16 DIAGNOSIS — C7951 Secondary malignant neoplasm of bone: Secondary | ICD-10-CM | POA: Diagnosis not present

## 2023-06-16 DIAGNOSIS — Z853 Personal history of malignant neoplasm of breast: Secondary | ICD-10-CM | POA: Diagnosis not present

## 2023-06-16 MED ORDER — IOHEXOL 300 MG/ML  SOLN
100.0000 mL | Freq: Once | INTRAMUSCULAR | Status: AC | PRN
  Administered 2023-06-16: 100 mL via INTRAVENOUS

## 2023-06-16 MED ORDER — TECHNETIUM TC 99M MEDRONATE IV KIT
20.0000 | PACK | Freq: Once | INTRAVENOUS | Status: AC | PRN
Start: 2023-06-16 — End: 2023-06-16
  Administered 2023-06-16: 19.58 via INTRAVENOUS

## 2023-06-17 ENCOUNTER — Inpatient Hospital Stay: Payer: 59 | Attending: Oncology

## 2023-06-17 ENCOUNTER — Inpatient Hospital Stay: Payer: 59

## 2023-06-17 ENCOUNTER — Inpatient Hospital Stay (HOSPITAL_BASED_OUTPATIENT_CLINIC_OR_DEPARTMENT_OTHER): Payer: 59 | Admitting: Oncology

## 2023-06-17 ENCOUNTER — Encounter: Payer: Self-pay | Admitting: Oncology

## 2023-06-17 VITALS — BP 109/58 | HR 81 | Temp 98.9°F | Resp 18 | Wt 250.9 lb

## 2023-06-17 DIAGNOSIS — Z79899 Other long term (current) drug therapy: Secondary | ICD-10-CM

## 2023-06-17 DIAGNOSIS — Z79810 Long term (current) use of selective estrogen receptor modulators (SERMs): Secondary | ICD-10-CM | POA: Insufficient documentation

## 2023-06-17 DIAGNOSIS — G893 Neoplasm related pain (acute) (chronic): Secondary | ICD-10-CM | POA: Diagnosis not present

## 2023-06-17 DIAGNOSIS — Z170 Estrogen receptor positive status [ER+]: Secondary | ICD-10-CM | POA: Diagnosis not present

## 2023-06-17 DIAGNOSIS — C50911 Malignant neoplasm of unspecified site of right female breast: Secondary | ICD-10-CM | POA: Insufficient documentation

## 2023-06-17 DIAGNOSIS — Z1501 Genetic susceptibility to malignant neoplasm of breast: Secondary | ICD-10-CM | POA: Insufficient documentation

## 2023-06-17 DIAGNOSIS — C7951 Secondary malignant neoplasm of bone: Secondary | ICD-10-CM | POA: Diagnosis not present

## 2023-06-17 DIAGNOSIS — Z1509 Genetic susceptibility to other malignant neoplasm: Secondary | ICD-10-CM | POA: Diagnosis not present

## 2023-06-17 DIAGNOSIS — C50919 Malignant neoplasm of unspecified site of unspecified female breast: Secondary | ICD-10-CM

## 2023-06-17 LAB — CBC WITH DIFFERENTIAL (CANCER CENTER ONLY)
Abs Immature Granulocytes: 0.04 10*3/uL (ref 0.00–0.07)
Basophils Absolute: 0 10*3/uL (ref 0.0–0.1)
Basophils Relative: 1 %
Eosinophils Absolute: 0.1 10*3/uL (ref 0.0–0.5)
Eosinophils Relative: 2 %
HCT: 37.4 % (ref 36.0–46.0)
Hemoglobin: 11.4 g/dL — ABNORMAL LOW (ref 12.0–15.0)
Immature Granulocytes: 1 %
Lymphocytes Relative: 17 %
Lymphs Abs: 1.1 10*3/uL (ref 0.7–4.0)
MCH: 26.5 pg (ref 26.0–34.0)
MCHC: 30.5 g/dL (ref 30.0–36.0)
MCV: 87 fL (ref 80.0–100.0)
Monocytes Absolute: 0.7 10*3/uL (ref 0.1–1.0)
Monocytes Relative: 11 %
Neutro Abs: 4.4 10*3/uL (ref 1.7–7.7)
Neutrophils Relative %: 68 %
Platelet Count: 212 10*3/uL (ref 150–400)
RBC: 4.3 MIL/uL (ref 3.87–5.11)
RDW: 17.8 % — ABNORMAL HIGH (ref 11.5–15.5)
WBC Count: 6.4 10*3/uL (ref 4.0–10.5)
nRBC: 0 % (ref 0.0–0.2)

## 2023-06-17 LAB — CMP (CANCER CENTER ONLY)
ALT: 47 U/L — ABNORMAL HIGH (ref 0–44)
AST: 41 U/L (ref 15–41)
Albumin: 3.6 g/dL (ref 3.5–5.0)
Alkaline Phosphatase: 115 U/L (ref 38–126)
Anion gap: 10 (ref 5–15)
BUN: 12 mg/dL (ref 6–20)
CO2: 23 mmol/L (ref 22–32)
Calcium: 8.9 mg/dL (ref 8.9–10.3)
Chloride: 106 mmol/L (ref 98–111)
Creatinine: 1.03 mg/dL — ABNORMAL HIGH (ref 0.44–1.00)
GFR, Estimated: >60 mL/min (ref >60)
Glucose, Bld: 107 mg/dL — ABNORMAL HIGH (ref 70–99)
Potassium: 4.2 mmol/L (ref 3.5–5.1)
Sodium: 139 mmol/L (ref 135–145)
Total Bilirubin: 0.5 mg/dL (ref <1.2)
Total Protein: 6.8 g/dL (ref 6.5–8.1)

## 2023-06-17 MED ORDER — ZOLEDRONIC ACID 4 MG/100ML IV SOLN
4.0000 mg | INTRAVENOUS | Status: DC
Start: 2023-06-17 — End: 2023-06-17
  Administered 2023-06-17: 4 mg via INTRAVENOUS
  Filled 2023-06-17: qty 100

## 2023-06-17 NOTE — Patient Instructions (Signed)

## 2023-06-18 ENCOUNTER — Other Ambulatory Visit: Payer: Self-pay

## 2023-06-18 LAB — CANCER ANTIGEN 27.29: CA 27.29: 155.5 U/mL — ABNORMAL HIGH (ref 0.0–38.6)

## 2023-06-19 ENCOUNTER — Encounter: Payer: Self-pay | Admitting: Oncology

## 2023-06-20 NOTE — Telephone Encounter (Signed)
Labs only- cbc with diff, cmp ca 27.29 in 1st week of jan. Labs and see me as planned in february

## 2023-06-21 ENCOUNTER — Other Ambulatory Visit: Payer: Self-pay

## 2023-06-21 ENCOUNTER — Other Ambulatory Visit (HOSPITAL_COMMUNITY): Payer: Self-pay

## 2023-06-21 ENCOUNTER — Other Ambulatory Visit: Payer: Self-pay | Admitting: Oncology

## 2023-06-22 ENCOUNTER — Encounter: Payer: Self-pay | Admitting: Oncology

## 2023-06-22 MED ORDER — OXYCODONE-ACETAMINOPHEN 5-325 MG PO TABS: 1.0000 | ORAL_TABLET | Freq: Four times a day (QID) | ORAL | 0 refills | Status: DC | PRN

## 2023-06-22 MED ORDER — MORPHINE SULFATE ER 15 MG PO TBCR: 15.0000 mg | EXTENDED_RELEASE_TABLET | Freq: Two times a day (BID) | ORAL | 0 refills | Status: DC

## 2023-06-22 NOTE — Progress Notes (Signed)
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  It was a 1.4 cm tumor with negative lymph nodes grade 1 and patient went on to get lumpectomy followed by adjuvant radiation therapy.  Oncotype DX score was 6 and she did not require adjuvant chemotherapy.  She was on tamoxifen for 5 years.  She had BRCA testing done at that time which was negative.   Patient felt a lump in her right axilla in September 2021.  She underwent ultrasound of bilateral breasts which did not pick up any axillary mass.  A prior screening mammogram in September 2021 was unremarkable.  She was then admitted for Covid pneumonia and underwent CT angio chest on 07/22/2020 which incidentally picked up an axillary mass measuring 2.5 cm.  No obvious breast mass.  Axillary mass was biopsied and was consistent with high-grade invasive mammary carcinoma.  IHC was positive for GATA3 with diffuse strong nuclear staining.  There was no lymph node architecture identified in the sample and it could not be determined if it is a primary tumor within the axillary breast tissue or metastases.  ER strongly positive greater than 90%, PR 1 to 10% positive and HER-2 negative   PET CT scan showed hypermetabolic poorly marginated solid right axillary mass 2.5 x 2 cm with an SUV  of 5.6.  No enlarged hypermetabolic mediastinal or hilar lymph nodes no enlarged left axillary lymph nodes.  Subcentimeter lung nodules below PET resolution.  Small hypermetabolism in the right liver with an SUV of 6 without CT correlate equivocal for liver metastases.  Multiple hypermetabolic faintly lytic osseous lesions throughout the lumbar spine and bilateral pelvic girdle with representative supra-acetabular right iliac bone 1.9 cm lesion, anterior superior left acetabular 1.7 cm lesion and L1 1.2 cm lesion.   NGS testing showed no actionable mutations.  PD-L1 less than 1%.  CHEK2 S422 FS, PIK3CA N1068 FS, PIK3CA N345H, CCN E1 gain, ERB B2 1 P-CSF 1R fusion, FGF 19 gain, FGF 3 gain, FGF 4gain.   Ibrance plus letrozole started in February 2022.    Patient noted to have increase in CA 27-29 from 26 and November 2023 203 in July 2024.  CT chest abdomen pelvis with contrast and bone scan showed worsening metastatic disease in the bones especially in the thoracolumbar spine and right hemipelvis.    Peripheral blood NGS testing showed CHEK2 mutation, PIK3CA P.N1068 FS, PIK3CA P.N345H, PTEN P.R173S, ESR 1P.D538G, ESR 1P.L536R, PIK3CA P.R88Q.  MSI high not detected  Elacestrant started in sept 2024.  She has not been on fulvestrant so far    Interval history-she has baseline fatigue but is overall tolerating elacestrant Well after her dose was reduced to 198 mg daily.  Pain is currently well-controlled with Neurontin and as needed Percocet.  She is also on MS Contin 15 mg every 12 hours  ECOG PS- 1 Pain scale- 3  Opioid associated constipation- no  Review of systems- Review of Systems  Constitutional:  Positive for malaise/fatigue. Negative for chills, fever and weight loss.  HENT:  Negative for congestion, ear discharge and nosebleeds.   Eyes:  Negative for blurred vision.  Respiratory:  Negative for cough, hemoptysis, sputum production, shortness of breath and wheezing.   Cardiovascular:   Negative for chest pain, palpitations, orthopnea and claudication.  Gastrointestinal:  Negative for abdominal pain, blood in stool, constipation, diarrhea, heartburn, melena, nausea and vomiting.  Genitourinary:  Negative for dysuria, flank pain, frequency, hematuria and urgency.  Musculoskeletal:  Negative for back pain, joint pain and myalgias.  Skin:  Negative for rash.  Neurological:  Negative for dizziness, tingling, focal weakness, seizures, weakness and headaches.  Endo/Heme/Allergies:  Does not bruise/bleed easily.  Psychiatric/Behavioral:  Negative for depression and suicidal ideas. The patient does not have insomnia.       Allergies  Allergen Reactions   Kiwi Extract Itching and Swelling    The real kiwi fruit   Codeine Other (See Comments)    Felt funny.   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   Chicken pox    Depression    Fatty liver    Fatty liver    Gastric polyps    GERD (gastroesophageal reflux disease)    Gestational diabetes    prediabetes out side of having baby   Headache    migraines   Heart murmur    History of abnormal mammogram 08/2006, 01/2008, 03/2009   Hypertension    IBS (irritable bowel syndrome)    Joint disorder    hx of left ankle tendonitis, bursitis, heel spur   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 Smith/ laprascopic surgery   COLONOSCOPY  04/04/2000   COLONOSCOPY WITH PROPOFOL N/A 02/12/2019   Procedure: COLONOSCOPY WITH PROPOFOL;  Surgeon: Christena Deem, MD;  Location: [facility] ENDOSCOPY;  Service: Endoscopy;  Laterality: N/A;   ESOPHAGOGASTRODUODENOSCOPY (EGD) WITH PROPOFOL N/A 05/01/2015   Procedure: ESOPHAGOGASTRODUODENOSCOPY (EGD) WITH PROPOFOL;  Surgeon: Wallace Cullens, MD;  Location: [facility] ENDOSCOPY;  Service:  Gastroenterology;  Laterality: N/A;   ESOPHAGOGASTRODUODENOSCOPY (EGD) WITH PROPOFOL N/A 02/12/2019   Procedure: ESOPHAGOGASTRODUODENOSCOPY (EGD) WITH PROPOFOL;  Surgeon: Christena Deem, MD;  Location: [facility] ENDOSCOPY;  Service: Endoscopy;  Laterality: N/A;   EYE SURGERY  08/2012   laser surgery on retina/ DR Appenzeller   FINGER SURGERY     repair of tendon in right index, Dr. Benay Pillow in [facility]   FOOT SURGERY     Dr. Al Corpus   IRRIGATION AND DEBRIDEMENT SEBACEOUS CYST     right upper back   LAPAROSCOPIC SUPRACERVICAL HYSTERECTOMY  06/2007   Dr Kincius/ adhesions/CPP/adenomyosis   MASTECTOMY PARTIAL / LUMPECTOMY Right 01/2008   with sentinal lymph node biopsy/ infiltrating ductal cancer   REPAIR PERONEAL TENDONS ANKLE  10/2019   with tarsal exostectomy and sural neuroma excision on right    SKIN CANCER EXCISION     back and calves   WISDOM TOOTH EXTRACTION  1991    Social History   Socioeconomic History   Marital status: Married    Spouse name: Brett Canales    Number of children: 1   Years of education: 14   Highest education level: Not on file  Occupational History   Occupation: CMA  Tobacco Use   Smoking status: Never   Smokeless tobacco: Never  Vaping Use   Vaping status: Never Used  Substance and Sexual Activity   Alcohol use: Yes    Alcohol/week: 0.0 standard drinks of alcohol    Comment: rare, 1-2 drinks per year   Drug use: No   Sexual activity: Yes    Partners: Male    Birth control/protection: Surgical    Comment: Hysterectomy   Other Topics Concern   Not on file  Social History Narrative   Lives in [place] with husband, no pets.  Works at [employer].      Diet - regular   Exercise - occasional   Social Determinants of Health   Financial Resource Strain: Low Risk  (11/23/2022)   Received from [facility], [facility]   Overall Financial Resource Strain (CARDIA)    Difficulty of Paying Living Expenses: Not hard at all  Food Insecurity: No Food Insecurity (11/23/2022)   [facility], [facility]   Hunger Vital Sign    Worried About Running Out of Food in the Last Year: Never true    Ran Out of Food in the Last Year: Never true  Transportation Needs: No Transportation Needs (11/23/2022)   Received from [facility], [facility]   [facility] - Transportation    In the past 12 months, has lack of transportation kept you from medical appointments or from getting medications?: No    Lack of Transportation (Non-Medical): No  Physical Activity: Insufficiently Active (07/27/2017)   Exercise Vital Sign    Days of Exercise per Week: 4 days    Minutes of Exercise per Session: 30 min  Stress: No Stress Concern Present (07/27/2017)   [employer] of Occupational Health - Occupational Stress Questionnaire    Feeling of Stress : Only a little  Social Connections: Socially Integrated (07/27/2017)   Social  Connection and Isolation Panel [NHANES]    Frequency of Communication with Friends and Family: More than three times a week    Frequency of Social Gatherings with Friends and Family: Once a week    Attends Religious Services: More than 4 times per year    Active Member of [employer]  or Organizations: Yes    Attends Banker Meetings: More than 4 times per year    Marital Status: Married  Catering manager Violence: Not At Risk (07/27/2017)   Humiliation, Afraid, Rape, and Kick questionnaire    Fear of Current or Ex-Partner: No    Emotionally Abused: No    Physically Abused: No    Sexually Abused: No    Family History  Problem Relation Age of Onset   Hypertension Mother    Thyroid disease Mother    Breast cancer Mother 28   Colon cancer Mother 96       again at 25   Atrial fibrillation Mother    Hip fracture Mother    Skin cancer Mother    Stroke Father    Hypertension Father    Cancer Father 81       prostate   Parkinson's disease Father    Skin cancer Father    Atrial fibrillation Sister        Pacemaker inserted end of Sept-beginning of Oct. 2024   Hypertension Sister    Thyroid disease Sister    Cancer Sister        skin/ squamous cell   Heart Problems Sister    Diabetes Maternal Aunt    Cancer Maternal Aunt    Breast cancer Maternal Aunt 2   Lymphoma Maternal Uncle    Heart disease Maternal Uncle    Melanoma Maternal Uncle 71   Lung cancer Paternal Aunt 59       smoker   Diabetes Maternal Grandmother    Stroke Maternal Grandfather    Heart disease Paternal Grandfather    Thyroid cancer Cousin        maternal   Breast cancer Cousin 60       maternal   Breast cancer Cousin 65   Colon cancer Cousin      Current Outpatient Medications:    aspirin EC 81 MG tablet, Take by mouth., Disp: , Rfl:    b complex vitamins capsule, Take 1 capsule by mouth daily., Disp: , Rfl:    buPROPion (WELLBUTRIN XL) 150 MG 24 hr tablet, Take 1 tablet (150 mg total) by  mouth daily. Take along a 300mg  tablet for a total daily dose of 450mg ., Disp: 90 tablet, Rfl: 3   buPROPion (WELLBUTRIN XL) 300 MG 24 hr tablet, Take 1 tablet (300 mg total) by mouth daily., Disp: 90 tablet, Rfl: 3   busPIRone (BUSPAR) 10 MG tablet, Take 1 tablet (10 mg total) by mouth 2 (two) times daily, Disp: 180 tablet, Rfl: 1   butalbital-acetaminophen-caffeine (FIORICET) 50-325-40 MG tablet, Take 1 tablet by mouth every 6 (six) hours as needed for headache., Disp: 30 tablet, Rfl: 0   calcium carbonate (OSCAL) 1500 (600 Ca) MG TABS tablet, Take by mouth 2 (two) times daily with a meal., Disp: , Rfl:    Cholecalciferol (VITAMIN D3) 10 MCG (400 UNIT) CAPS, Take by mouth., Disp: , Rfl:    citalopram (CELEXA) 40 MG tablet, Take 1 tablet (40 mg total) by mouth daily., Disp: 90 tablet, Rfl: 3   diphenhydrAMINE (BENADRYL) 25 MG tablet, Take 25 mg by mouth at bedtime as needed for sleep. Pt states daily now for sleep and allergies., Disp: , Rfl:    elacestrant hydrochloride (ORSERDU) 86 MG tablet, Take 3 tablets daily, Disp: 90 tablet, Rfl: 1   gabapentin (NEURONTIN) 100 MG capsule, Take 1 capsule (100 mg total) by mouth every morning AND 2 capsules (200  mg total) every evening., Disp: 270 capsule, Rfl: 1   ketotifen (ZADITOR) 0.025 % ophthalmic solution, Place 1 drop into both eyes daily., Disp: , Rfl:    lisinopril (ZESTRIL) 10 MG tablet, Take 1 tablet (10 mg total) by mouth daily., Disp: 90 tablet, Rfl: 1   loperamide (IMODIUM) 2 MG capsule, Take 2 tabs by mouth with first loose stool, then 1 tab with each additional loose stool as needed. Do not exceed 8 tabs in a 24-hour period, Disp: 30 capsule, Rfl: 0   metFORMIN (GLUCOPHAGE-XR) 500 MG 24 hr tablet, Take 1 tablet (500 mg total) by mouth 2 (two) times daily with a meal., Disp: 180 tablet, Rfl: 1   morphine (MS CONTIN) 15 MG 12 hr tablet, Take 1 tablet (15 mg total) by mouth every 12 (twelve) hours., Disp: 60 tablet, Rfl: 0   Multiple Vitamin  (MULTIVITAMIN) tablet, Take 1 tablet by mouth daily., Disp: , Rfl:    omeprazole (PRILOSEC) 20 MG capsule, Take 1 capsule (20 mg total) by mouth daily., Disp: 90 capsule, Rfl: 3   ondansetron (ZOFRAN) 4 MG tablet, Take 1 tablet (4 mg total) by mouth every 8 (eight) hours as needed for nausea or vomiting., Disp: 90 tablet, Rfl: 0   ondansetron (ZOFRAN) 8 MG tablet, Take 1 tablet (8 mg total) by mouth every 8 (eight) hours as needed for nausea or vomiting., Disp: 30 tablet, Rfl: 1   oxyCODONE-acetaminophen (PERCOCET/ROXICET) 5-325 MG tablet, Take 1 tablet by mouth every 6 (six) hours as needed for severe pain (pain score 7-10)., Disp: 30 tablet, Rfl: 0   prochlorperazine (COMPAZINE) 10 MG tablet, Take 1 tablet (10 mg total) by mouth every 6 (six) hours as needed for nausea or vomiting., Disp: 30 tablet, Rfl: 1  Physical exam:  Vitals:   06/17/23 1006  BP: (!) 109/58  Pulse: 81  Resp: 18  Temp: 98.9 F (37.2 C)  TempSrc: Tympanic  SpO2: 99%  Weight: 250 lb 14.4 oz (113.8 kg)   Physical Exam Cardiovascular:     Rate and Rhythm: Normal rate and regular rhythm.     Heart sounds: Normal heart sounds.  Pulmonary:     Effort: Pulmonary effort is normal.     Breath sounds: Normal breath sounds.  Skin:    General: Skin is warm and dry.  Neurological:     Mental Status: She is alert and oriented to person, place, and time.         Latest Ref Rng & Units 06/17/2023    9:43 AM  CMP  Glucose 70 - 99 mg/dL 161   BUN 6 - 20 mg/dL 12   Creatinine 0.96 - 1.00 mg/dL 0.45   Sodium 409 - 811 mmol/L 139   Potassium 3.5 - 5.1 mmol/L 4.2   Chloride 98 - 111 mmol/L 106   CO2 22 - 32 mmol/L 23   Calcium 8.9 - 10.3 mg/dL 8.9   Total Protein 6.5 - 8.1 g/dL 6.8   Total Bilirubin <9.1 mg/dL 0.5   Alkaline Phos 38 - 126 U/L 115   AST 15 - 41 U/L 41   ALT 0 - 44 U/L 47       Latest Ref Rng & Units 06/17/2023    9:43 AM  CBC  WBC 4.0 - 10.5 K/uL 6.4   Hemoglobin 12.0 - 15.0 g/dL 47.8    Hematocrit 29.5 - 46.0 % 37.4   Platelets 150 - 400 K/uL 212     No images are attached  to the encounter.  NM Bone Scan Whole Body  Result Date: 06/17/2023 CLINICAL DATA:  Breast cancer. EXAM: NUCLEAR MEDICINE WHOLE BODY BONE SCAN TECHNIQUE: Whole body anterior and posterior images were obtained approximately 3 hours after intravenous injection of radiopharmaceutical. RADIOPHARMACEUTICALS:  19.58 mCi Technetium-11m MDP IV COMPARISON:  Bone scan 02/11/2023.  PET-CT 03/18/2023. FINDINGS: Physiologic distribution radiotracer along the kidneys and bladder. There are areas of degenerative type uptake seen along the ankles, knees. However there is a multifocal areas of abnormal radiotracer uptake consistent with osseous metastatic disease. This includes multifocal areas along the spine particularly the mid to lower thoracic spine and upper lumbar spine as well as the sacrum. Areas along the pelvis, right-greater-than-left. Numerous rib areas more along the posterior ribs and anterior as well as proximal humerus on the right-greater-than-left, midshaft of the left femur and along the skull. Focus as well along the scapula bilaterally. Compared to the prior bone scan these are significantly increased in extent and distribution. Compared to the more recent PET-CT the distribution also appears to be increased particularly along the spine, pelvis and ribs. IMPRESSION: Progressive distribution of osseous metastatic disease Electronically Signed   By: Karen Kays M.D.   On: 06/17/2023 11:21   CT CHEST ABDOMEN PELVIS W CONTRAST  Result Date: 06/16/2023 CLINICAL DATA:  History of metastatic breast cancer, follow-up. * Tracking Code: BO * EXAM: CT CHEST, ABDOMEN, AND PELVIS WITH CONTRAST TECHNIQUE: Multidetector CT imaging of the chest, abdomen and pelvis was performed following the standard protocol during bolus administration of intravenous contrast. RADIATION DOSE REDUCTION: This exam was performed according to  the departmental dose-optimization program which includes automated exposure control, adjustment of the mA and/or kV according to patient size and/or use of iterative reconstruction technique. CONTRAST:  OMNIPAQUE IOHEXOL 300 MG/ML  SOLN COMPARISON:  Multiple priors including PET-CT March 18, 2023, nuclear medicine bone scan February 11, 2023 CT chest abdomen pelvis February 11, 2023. FINDINGS: CT CHEST FINDINGS Cardiovascular: No significant vascular findings. Normal heart size. No pericardial effusion. Mediastinum/Nodes: No enlarged mediastinal, hilar, or axillary lymph nodes. Thyroid gland, trachea, and esophagus demonstrate no significant findings. Lungs/Pleura: Lungs are clear. No pleural effusion or pneumothorax. Musculoskeletal: Unchanged postoperative appearance of the right breast without new suspicious nodularity. Similar extensive sclerotic and mixed osseous metastatic disease involving the axial and appendicular skeleton. No new discrete osseous lesion identified. CT ABDOMEN PELVIS FINDINGS Hepatobiliary: Diffuse hepatic steatosis. No suspicious hepatic lesion. Gallbladder surgically absent. No biliary ductal dilation. Pancreas: No pancreatic ductal dilation or evidence of acute inflammation. Spleen: No splenomegaly. Adrenals/Urinary Tract: Bilateral adrenal glands appear normal. No hydronephrosis. 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 Urinary bladder is unremarkable for degree of distension. Stomach/Bowel: No radiopaque enteric contrast material was administered. Stomach is unremarkable for degree of distension. No pathologic dilation of small or large bowel. Moderate volume of formed stool in the colon. Sigmoid colonic diverticulosis without findings of acute diverticulitis. Vascular/Lymphatic: Normal caliber abdominal aorta. Smooth IVC contours. The portal, splenic and superior mesenteric veins are patent. No pathologically enlarged abdominal or pelvic  lymph nodes. Reproductive: No mass or other significant abnormality. Other: No abdominal wall hernia or abnormality. No abdominopelvic ascites. Musculoskeletal: No significant interval change in the sclerotic and mixed osseous metastasis throughout the axial and proximal appendicular skeleton. IMPRESSION: 1. No significant interval change extensive osseous metastatic disease throughout the axial and proximal appendicular skeleton. No new discrete osseous lesion identified. 2. No evidence of pathologically enlarged adenopathy or  visceral metastatic disease in the chest, abdomen or pelvis. 3. Diffuse hepatic steatosis. 4. Sigmoid colonic diverticulosis without findings of acute diverticulitis. 5. Moderate volume of formed stool in the colon. Correlate for constipation. Electronically Signed   By: Maudry Mayhew M.D.   On: 06/16/2023 13:39   MM 3D SCREENING MAMMOGRAM BILATERAL BREAST  Result Date: 05/27/2023 CLINICAL DATA:  Screening. EXAM: DIGITAL SCREENING BILATERAL MAMMOGRAM WITH TOMOSYNTHESIS AND CAD TECHNIQUE: Bilateral screening digital craniocaudal and mediolateral oblique mammograms were obtained. Bilateral screening digital breast tomosynthesis was performed. The images were evaluated with computer-aided detection. COMPARISON:  Previous exam(s). ACR Breast Density Category b: There are scattered areas of fibroglandular density. FINDINGS: There are no findings suspicious for malignancy. IMPRESSION: No mammographic evidence of malignancy. A result letter of this screening mammogram will be mailed directly to the patient. RECOMMENDATION: Screening mammogram in one year. (Code:SM-B-01Y) BI-RADS CATEGORY  1: Negative. Electronically Signed   By: Ted Mcalpine M.D.   On: 05/27/2023 15:39     Assessment and plan- Patient is a 62 y.o. female with history of ER positive HER2 negative metastatic breast cancer with bone metastases currently on elacestrant here for routine follow-up    I have reviewed  bone scan images independently and discussed findings with the patient which does show increased uptake especially in the mid to lower thoracic spine and lumbar spine as well as sacrum and more uptake in the rib areas.  CT chest abdomen and pelvis with contrast does not show any evidence of metastatic disease involving the lymph nodes or viscera.  Tumor marker CA 27-29 Was not back at the time of my visit but later came back at 155 which was in the water as compared to 185 in October 2024.  I am therefore inclined to monitor her CA 27-29 again in 1 month and I will see her back in 2 months.  If there is a continued downward trend in her tumor markers I will have a sharp bone scan repeated in about 2 months before considering a change in her treatment.  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  Neoplasm related pain: Continue long-acting morphine and as needed hydrocodone   Visit Diagnosis 1. 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 High risk medication use   3. Neoplasm related pain      Dr. Owens Shark, MD, MPH [facility] at [facility] [phone] 06/22/2023 8:42 AM

## 2023-06-23 ENCOUNTER — Other Ambulatory Visit: Payer: Self-pay | Admitting: *Deleted

## 2023-06-24 ENCOUNTER — Encounter: Payer: Self-pay | Admitting: Oncology

## 2023-06-24 ENCOUNTER — Other Ambulatory Visit: Payer: Self-pay

## 2023-06-24 MED ORDER — MORPHINE SULFATE ER 15 MG PO TBCR
15.0000 mg | EXTENDED_RELEASE_TABLET | Freq: Two times a day (BID) | ORAL | 0 refills | Status: DC
  Filled 2023-06-24 (×2): qty 30, 15d supply, fill #0

## 2023-06-29 ENCOUNTER — Other Ambulatory Visit (HOSPITAL_COMMUNITY): Payer: Self-pay

## 2023-07-01 ENCOUNTER — Telehealth: Payer: Self-pay | Admitting: *Deleted

## 2023-07-01 NOTE — Telephone Encounter (Signed)
Form completed and faxed to [employer], copied for chart. Patient informed via [employer] message.

## 2023-07-01 NOTE — Telephone Encounter (Signed)
Received FMLA for patient, form completed and sent for doctor signature

## 2023-07-12 DIAGNOSIS — E1122 Type 2 diabetes mellitus with diabetic chronic kidney disease: Secondary | ICD-10-CM | POA: Diagnosis not present

## 2023-07-12 DIAGNOSIS — Z79899 Other long term (current) drug therapy: Secondary | ICD-10-CM | POA: Diagnosis not present

## 2023-07-12 DIAGNOSIS — E039 Hypothyroidism, unspecified: Secondary | ICD-10-CM | POA: Diagnosis not present

## 2023-07-12 DIAGNOSIS — C7951 Secondary malignant neoplasm of bone: Secondary | ICD-10-CM | POA: Diagnosis not present

## 2023-07-12 DIAGNOSIS — C50919 Malignant neoplasm of unspecified site of unspecified female breast: Secondary | ICD-10-CM | POA: Diagnosis not present

## 2023-07-12 DIAGNOSIS — F325 Major depressive disorder, single episode, in full remission: Secondary | ICD-10-CM | POA: Diagnosis not present

## 2023-07-12 DIAGNOSIS — E1159 Type 2 diabetes mellitus with other circulatory complications: Secondary | ICD-10-CM | POA: Diagnosis not present

## 2023-07-12 DIAGNOSIS — I152 Hypertension secondary to endocrine disorders: Secondary | ICD-10-CM | POA: Diagnosis not present

## 2023-07-12 DIAGNOSIS — N1831 Chronic kidney disease, stage 3a: Secondary | ICD-10-CM | POA: Diagnosis not present

## 2023-07-13 ENCOUNTER — Other Ambulatory Visit: Payer: Self-pay | Admitting: Oncology

## 2023-07-13 DIAGNOSIS — C50911 Malignant neoplasm of unspecified site of right female breast: Secondary | ICD-10-CM

## 2023-07-14 ENCOUNTER — Encounter: Payer: Self-pay | Admitting: Oncology

## 2023-07-14 NOTE — Telephone Encounter (Signed)
 Created in error

## 2023-07-14 NOTE — Telephone Encounter (Signed)
 CBC with Differential (Cancer Center Only) Order: [id number]  Status: Final result     Next appt: 07/15/2023 at 10:30 AM in Oncology ([facility])     Dx: Carcinoma of right breast metastatic ...   Test Result Released: Yes (seen)   0 Result Notes          Component Ref Range & Units (hover) 3 wk ago (06/17/23) 2 mo ago (04/29/23) 5 mo ago (02/04/23) 8 mo ago (11/05/22) 10 mo ago (08/20/22) 1 yr ago (01/26/22) 1 yr ago (11/17/21)  WBC Count 6.4 4.4 3.8 Low  3.2 Low  4.6 3.4 Low  3.2 Low   RBC 4.30 3.83 Low  3.76 Low  3.82 Low  4.26 3.66 Low  3.86 Low   Hemoglobin 11.4 Low  10.6 Low  11.4 Low  11.6 Low  12.9 11.7 Low  12.2  HCT 37.4 35.9 Low  35.6 Low  35.7 Low  40.0 35.3 Low  37.2  MCV 87.0 93.7 94.7 93.5 93.9 96.4 96.4  MCH 26.5 27.7 30.3 30.4 30.3 32.0 31.6  MCHC 30.5 29.5 Low  32.0 32.5 32.3 33.1 32.8  RDW 17.8 High  18.2 High  16.7 High  16.8 High  17.4 High  15.7 High  16.8 High   Platelet Count 212 172 180 175 296 CM 171 238  nRBC 0.0 0.0 0.0 0.0 0.0 0.0 0.0  Neutrophils Relative % 68 59 56 55 66 55 62  Neutro Abs 4.4 2.6 2.1 1.8 3.1 1.8 2.0  Lymphocytes Relative 17 24 29 30 22  32 27  Lymphs Abs 1.1 1.0 1.1 1.0 1.0 1.1 0.9  Monocytes Relative 11 11 11 11 7 9 8   Monocytes Absolute 0.7 0.5 0.4 0.4 0.3 0.3 0.2  Eosinophils Relative 2 4 2 3 5 3 2   Eosinophils Absolute 0.1 0.2 0.1 0.1 0.2 0.1 0.1  Basophils Relative 1 1 1 1  0 1 1  Basophils Absolute 0.0 0.0 0.0 0.0 0.0 0.0 0.0  Immature Granulocytes 1 1 1  0  0 0  Abs Immature Granulocytes 0.04 0.04 CM 0.03 CM 0.01 CM 0.00 0.01 0.01 CM  Comment: Performed at [facility], [address] [place], [place] [address]  Band Neutrophils     0 R    Smear Review     PLATELETS APPEAR ADEQUATE CM Normal platelet morphology CM   Polychromasia      PRESENT CM   Metamyelocytes Relative     0 R    Myelocytes     0 R    Promyelocytes Relative     0 R    Blasts     0 R    nRBC     0 R    Other     0 R    RBC Morphology      MORPHOLOGY UNREMARKABLE MORPHOLOGY UNREMARKABLE VC, CM   WBC Morphology     DIFF CONFIRMED BY SMEAR DIFF CONFIRMED BY MANUAL. VC, CM   Resulting Agency [facility]        Specimen Collected: 06/17/23 09:43 Last Resulted: 06/17/23 10:10          Specimen Collected: 06/17/23 09:43 Last Resulted: 06/17/23 10:10     Cancer Antigen 27.29 Order: [id number]  Status: Final result     Next appt: 07/15/2023 at 10:30 AM in Oncology ([facility])     Dx: Carcinoma of right  breast metastatic ...     Test Result Released: Yes (seen)   0 Result Notes          Component Ref Range & Units (hover) 3 wk ago (06/17/23) 2 mo ago (04/29/23) 5 mo ago (02/04/23) 1 yr ago (05/14/22) 1 yr ago (08/14/21) 2 yr ago (01/23/21) 2 yr ago (11/28/20)  CA 27.29 155.5 High  185.1 High  CM 103.3 High  CM 26.9 CM 21.4 CM 27.6 CM 26.1 CM  Comment: (NOTE)    CMP (Cancer Center only) Order: [id number]  Status: Final result     Next appt: 07/15/2023 at 10:30 AM in Oncology ([facility])     Dx: Carcinoma of right breast metastatic ...     Test Result Released: Yes (seen)   0 Result Notes     1 HM Topic          Component Ref Range & Units (hover) 3 wk ago 2 mo ago 4 mo ago 5 mo ago 8 mo ago 10 mo ago 1 yr ago  Sodium 139 139  136 135 136   Potassium 4.2 4.2  3.9 4.0 3.9   Chloride 106 107  103 103 101   CO2 23 23  22 21  Low  23   Glucose, Bld 107 High  130 High  CM 135 High  123 High  CM 102 High  CM 110 High  CM   Comment: Glucose reference range applies only to samples taken after fasting for at least 8 hours.  BUN 12 13  20 16 20    Creatinine 1.03 High  1.09 High   0.99 0.97 1.16 High  1.20 High   Calcium 8.9 8.7 Low   9.0 8.8 Low  9.1   Total Protein 6.8 6.5  7.3 7.2 7.9   Albumin 3.6 3.5  3.8 4.0 4.1   AST 41 30  24 24 26    ALT 47 High  29  25 26 25    Alkaline Phosphatase 115 101  106 89 109   Total Bilirubin 0.5 0.5 R  0.4  R 0.4 R 0.6 R   GFR, Estimated >60        Comment: (NOTE) Calculated using the CKD-EPI Creatinine Equation (2021)  Anion gap 10 9 CM  11 CM 11 CM 12 CM   Comment: Performed at [facility], [address] [place], [place] [address]  Resulting Agency [facility]        Specimen Collected: 06/17/23 09:43 Last Resulted: 06/17/23 10:06

## 2023-07-15 ENCOUNTER — Inpatient Hospital Stay: Payer: Self-pay | Attending: Oncology

## 2023-07-15 DIAGNOSIS — Z79811 Long term (current) use of aromatase inhibitors: Secondary | ICD-10-CM | POA: Insufficient documentation

## 2023-07-15 DIAGNOSIS — C50911 Malignant neoplasm of unspecified site of right female breast: Secondary | ICD-10-CM | POA: Insufficient documentation

## 2023-07-15 DIAGNOSIS — C7951 Secondary malignant neoplasm of bone: Secondary | ICD-10-CM | POA: Insufficient documentation

## 2023-07-15 DIAGNOSIS — C773 Secondary and unspecified malignant neoplasm of axilla and upper limb lymph nodes: Secondary | ICD-10-CM | POA: Insufficient documentation

## 2023-07-15 DIAGNOSIS — Z170 Estrogen receptor positive status [ER+]: Secondary | ICD-10-CM | POA: Insufficient documentation

## 2023-07-15 LAB — COMPREHENSIVE METABOLIC PANEL
ALT: 20 U/L (ref 0–44)
AST: 20 U/L (ref 15–41)
Albumin: 3.7 g/dL (ref 3.5–5.0)
Alkaline Phosphatase: 97 U/L (ref 38–126)
Anion gap: 10 (ref 5–15)
BUN: 18 mg/dL (ref 6–20)
CO2: 21 mmol/L — ABNORMAL LOW (ref 22–32)
Calcium: 9.3 mg/dL (ref 8.9–10.3)
Chloride: 107 mmol/L (ref 98–111)
Creatinine, Ser: 1 mg/dL (ref 0.44–1.00)
GFR, Estimated: >60 mL/min (ref >60)
Glucose, Bld: 106 mg/dL — ABNORMAL HIGH (ref 70–99)
Potassium: 4.3 mmol/L (ref 3.5–5.1)
Sodium: 138 mmol/L (ref 135–145)
Total Bilirubin: 0.3 mg/dL (ref 0.0–1.2)
Total Protein: 7.1 g/dL (ref 6.5–8.1)

## 2023-07-15 LAB — CBC WITH DIFFERENTIAL/PLATELET
Abs Immature Granulocytes: 0.03 10*3/uL (ref 0.00–0.07)
Basophils Absolute: 0 10*3/uL (ref 0.0–0.1)
Basophils Relative: 0 %
Eosinophils Absolute: 0.2 10*3/uL (ref 0.0–0.5)
Eosinophils Relative: 4 %
HCT: 37.9 % (ref 36.0–46.0)
Hemoglobin: 11.9 g/dL — ABNORMAL LOW (ref 12.0–15.0)
Immature Granulocytes: 1 %
Lymphocytes Relative: 22 %
Lymphs Abs: 1.1 10*3/uL (ref 0.7–4.0)
MCH: 26.8 pg (ref 26.0–34.0)
MCHC: 31.4 g/dL (ref 30.0–36.0)
MCV: 85.4 fL (ref 80.0–100.0)
Monocytes Absolute: 0.6 10*3/uL (ref 0.1–1.0)
Monocytes Relative: 12 %
Neutro Abs: 3.1 10*3/uL (ref 1.7–7.7)
Neutrophils Relative %: 61 %
Platelets: 201 10*3/uL (ref 150–400)
RBC: 4.44 MIL/uL (ref 3.87–5.11)
RDW: 17.7 % — ABNORMAL HIGH (ref 11.5–15.5)
WBC: 5.1 10*3/uL (ref 4.0–10.5)
nRBC: 0 % (ref 0.0–0.2)

## 2023-07-16 LAB — CANCER ANTIGEN 27.29: CA 27.29: 145.6 U/mL — ABNORMAL HIGH (ref 0.0–38.6)

## 2023-07-21 ENCOUNTER — Other Ambulatory Visit (HOSPITAL_COMMUNITY): Payer: Self-pay

## 2023-07-26 ENCOUNTER — Encounter: Payer: Self-pay | Admitting: Oncology

## 2023-07-26 ENCOUNTER — Inpatient Hospital Stay: Payer: Self-pay

## 2023-07-26 NOTE — Progress Notes (Signed)
 [facility] CSW Progress Note  Clinical Child Psychotherapist contacted patient by phone to discuss [employer].  Patient is not interested in completing at this time.  Provided contact information if she would like information in the future.    Macario CHRISTELLA Au, LCSW Clinical Social Worker [facility]

## 2023-07-27 ENCOUNTER — Other Ambulatory Visit (HOSPITAL_COMMUNITY): Payer: Self-pay

## 2023-07-27 ENCOUNTER — Other Ambulatory Visit: Payer: Self-pay

## 2023-07-27 MED ORDER — BUPROPION HCL ER (XL) 300 MG PO TB24
300.0000 mg | ORAL_TABLET | Freq: Every day | ORAL | 3 refills | Status: DC
  Filled 2023-08-10 – 2023-09-19 (×2): qty 90, 90d supply, fill #0
  Filled 2024-03-27: qty 90, 90d supply, fill #1

## 2023-08-01 ENCOUNTER — Other Ambulatory Visit (HOSPITAL_COMMUNITY): Payer: Self-pay

## 2023-08-01 MED ORDER — LISINOPRIL 10 MG PO TABS
10.0000 mg | ORAL_TABLET | Freq: Every day | ORAL | 1 refills | Status: DC
  Filled 2023-08-10 – 2023-09-19 (×2): qty 90, 90d supply, fill #0

## 2023-08-02 ENCOUNTER — Other Ambulatory Visit: Payer: Self-pay

## 2023-08-10 ENCOUNTER — Encounter: Payer: Self-pay | Admitting: Oncology

## 2023-08-10 ENCOUNTER — Encounter (HOSPITAL_COMMUNITY): Payer: Self-pay

## 2023-08-10 ENCOUNTER — Other Ambulatory Visit (HOSPITAL_COMMUNITY): Payer: Self-pay

## 2023-08-10 ENCOUNTER — Other Ambulatory Visit: Payer: Self-pay

## 2023-08-11 ENCOUNTER — Other Ambulatory Visit: Payer: Self-pay

## 2023-08-11 ENCOUNTER — Telehealth: Payer: Self-pay | Admitting: *Deleted

## 2023-08-11 ENCOUNTER — Other Ambulatory Visit (HOSPITAL_COMMUNITY): Payer: Self-pay

## 2023-08-11 NOTE — Telephone Encounter (Signed)
Mrs. Jacqueline Deleon, the reason for my call and need a refill on the oxycodone acetaminophen as well as the morphine and I would like both of these to go to the [facility] in [place] last filled 12/13 and oxy-12/11. Last time we had to get iget the morphine at [facility] . But [facility].  Both of them seem early to fill

## 2023-08-12 ENCOUNTER — Other Ambulatory Visit: Payer: Self-pay

## 2023-08-17 ENCOUNTER — Other Ambulatory Visit: Payer: Self-pay

## 2023-08-18 ENCOUNTER — Other Ambulatory Visit: Payer: Self-pay

## 2023-08-18 ENCOUNTER — Other Ambulatory Visit: Payer: Self-pay | Admitting: Hospice and Palliative Medicine

## 2023-08-18 ENCOUNTER — Other Ambulatory Visit: Payer: Self-pay | Admitting: Oncology

## 2023-08-18 ENCOUNTER — Other Ambulatory Visit (HOSPITAL_COMMUNITY): Payer: Self-pay

## 2023-08-19 ENCOUNTER — Other Ambulatory Visit: Payer: Self-pay

## 2023-08-19 ENCOUNTER — Encounter: Payer: Self-pay | Admitting: Oncology

## 2023-08-22 ENCOUNTER — Encounter: Payer: Self-pay | Admitting: Oncology

## 2023-08-22 ENCOUNTER — Other Ambulatory Visit: Payer: Self-pay

## 2023-08-22 MED ORDER — MORPHINE SULFATE ER 15 MG PO TBCR: 15.0000 mg | EXTENDED_RELEASE_TABLET | Freq: Two times a day (BID) | ORAL | 0 refills | Status: DC

## 2023-08-23 ENCOUNTER — Encounter: Payer: Self-pay | Admitting: Oncology

## 2023-08-23 ENCOUNTER — Other Ambulatory Visit: Payer: Self-pay

## 2023-08-23 MED FILL — Oxycodone w/ Acetaminophen Tab 5-325 MG: ORAL | 8 days supply | Qty: 30 | Fill #0 | Status: AC

## 2023-08-24 ENCOUNTER — Other Ambulatory Visit: Payer: Self-pay | Admitting: *Deleted

## 2023-08-24 ENCOUNTER — Telehealth: Payer: Self-pay | Admitting: *Deleted

## 2023-08-24 MED ORDER — MORPHINE SULFATE ER 15 MG PO TBCR: 15.0000 mg | EXTENDED_RELEASE_TABLET | Freq: Two times a day (BID) | ORAL | 0 refills | Status: DC

## 2023-08-24 NOTE — Telephone Encounter (Signed)
Needs ms contin and it is hard to get, I told her that I get looking for her and see what I can do . [facility] that has the generic  morphine sulfate ER, she will get 15 mg  every 12 hours.

## 2023-08-24 NOTE — Telephone Encounter (Signed)
Some one put in for ms contin and it sent to specialty pharmacy, I [facility] [address] [facility]. No one has any. I called  [facility] and they have the generic morphine sulfate long acting and send it there for the patient

## 2023-08-24 NOTE — Telephone Encounter (Signed)
Can you [facility] it [facility] in that case?

## 2023-08-26 ENCOUNTER — Other Ambulatory Visit: Payer: Self-pay

## 2023-08-26 ENCOUNTER — Other Ambulatory Visit (HOSPITAL_COMMUNITY): Payer: Self-pay

## 2023-08-26 ENCOUNTER — Encounter: Payer: Self-pay | Admitting: Oncology

## 2023-08-26 ENCOUNTER — Inpatient Hospital Stay (HOSPITAL_BASED_OUTPATIENT_CLINIC_OR_DEPARTMENT_OTHER): Payer: Self-pay | Admitting: Oncology

## 2023-08-26 ENCOUNTER — Inpatient Hospital Stay: Payer: Self-pay | Attending: Oncology

## 2023-08-26 VITALS — BP 123/74 | HR 75 | Temp 97.2°F | Resp 18 | Wt 243.0 lb

## 2023-08-26 DIAGNOSIS — C7951 Secondary malignant neoplasm of bone: Secondary | ICD-10-CM | POA: Insufficient documentation

## 2023-08-26 DIAGNOSIS — G893 Neoplasm related pain (acute) (chronic): Secondary | ICD-10-CM

## 2023-08-26 DIAGNOSIS — Z170 Estrogen receptor positive status [ER+]: Secondary | ICD-10-CM | POA: Insufficient documentation

## 2023-08-26 DIAGNOSIS — Z853 Personal history of malignant neoplasm of breast: Secondary | ICD-10-CM | POA: Insufficient documentation

## 2023-08-26 DIAGNOSIS — C50911 Malignant neoplasm of unspecified site of right female breast: Secondary | ICD-10-CM

## 2023-08-26 DIAGNOSIS — C50919 Malignant neoplasm of unspecified site of unspecified female breast: Secondary | ICD-10-CM

## 2023-08-26 DIAGNOSIS — Z79899 Other long term (current) drug therapy: Secondary | ICD-10-CM

## 2023-08-26 LAB — COMPREHENSIVE METABOLIC PANEL
ALT: 21 U/L (ref 0–44)
AST: 25 U/L (ref 15–41)
Albumin: 3.7 g/dL (ref 3.5–5.0)
Alkaline Phosphatase: 86 U/L (ref 38–126)
Anion gap: 12 (ref 5–15)
BUN: 18 mg/dL (ref 6–20)
CO2: 20 mmol/L — ABNORMAL LOW (ref 22–32)
Calcium: 9.3 mg/dL (ref 8.9–10.3)
Chloride: 107 mmol/L (ref 98–111)
Creatinine, Ser: 1 mg/dL (ref 0.44–1.00)
GFR, Estimated: >60 mL/min (ref >60)
Glucose, Bld: 114 mg/dL — ABNORMAL HIGH (ref 70–99)
Potassium: 3.9 mmol/L (ref 3.5–5.1)
Sodium: 139 mmol/L (ref 135–145)
Total Bilirubin: 0.5 mg/dL (ref 0.0–1.2)
Total Protein: 7.3 g/dL (ref 6.5–8.1)

## 2023-08-26 LAB — CBC WITH DIFFERENTIAL/PLATELET
Abs Immature Granulocytes: 0.02 10*3/uL (ref 0.00–0.07)
Basophils Absolute: 0 10*3/uL (ref 0.0–0.1)
Basophils Relative: 0 %
Eosinophils Absolute: 0.1 10*3/uL (ref 0.0–0.5)
Eosinophils Relative: 2 %
HCT: 38 % (ref 36.0–46.0)
Hemoglobin: 11.8 g/dL — ABNORMAL LOW (ref 12.0–15.0)
Immature Granulocytes: 0 %
Lymphocytes Relative: 18 %
Lymphs Abs: 1.2 10*3/uL (ref 0.7–4.0)
MCH: 25.9 pg — ABNORMAL LOW (ref 26.0–34.0)
MCHC: 31.1 g/dL (ref 30.0–36.0)
MCV: 83.3 fL (ref 80.0–100.0)
Monocytes Absolute: 0.6 10*3/uL (ref 0.1–1.0)
Monocytes Relative: 10 %
Neutro Abs: 4.4 10*3/uL (ref 1.7–7.7)
Neutrophils Relative %: 70 %
Platelets: 226 10*3/uL (ref 150–400)
RBC: 4.56 MIL/uL (ref 3.87–5.11)
RDW: 16.9 % — ABNORMAL HIGH (ref 11.5–15.5)
WBC: 6.3 10*3/uL (ref 4.0–10.5)
nRBC: 0 % (ref 0.0–0.2)

## 2023-08-27 ENCOUNTER — Other Ambulatory Visit: Payer: Self-pay

## 2023-08-27 LAB — CANCER ANTIGEN 27.29: CA 27.29: 187 U/mL — ABNORMAL HIGH (ref 0.0–38.6)

## 2023-08-28 ENCOUNTER — Encounter: Payer: Self-pay | Admitting: Oncology

## 2023-08-28 ENCOUNTER — Other Ambulatory Visit (HOSPITAL_COMMUNITY): Payer: Self-pay

## 2023-08-28 NOTE — Progress Notes (Signed)
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  It was a 1.4 cm tumor with negative lymph nodes grade 1 and patient went on to get lumpectomy followed by adjuvant radiation therapy.  Oncotype DX score was 6 and she did not require adjuvant chemotherapy.  She was on tamoxifen for 5 years.  She had BRCA testing done at that time which was negative.   Patient felt a lump in her right axilla in September 2021.  She underwent ultrasound of bilateral breasts which did not pick up any axillary mass.  A prior screening mammogram in September 2021 was unremarkable.  She was then admitted for Covid pneumonia and underwent CT angio chest on 07/22/2020 which incidentally picked up an axillary mass measuring 2.5 cm.  No obvious breast mass.  Axillary mass was biopsied and was consistent with high-grade invasive mammary carcinoma.  IHC was positive for GATA3 with diffuse strong nuclear staining.  There was no lymph node architecture identified in the sample and it could not be determined if it is a primary tumor within the axillary breast tissue or metastases.  ER strongly positive greater than 90%, PR 1 to 10% positive and HER-2 negative   PET CT scan showed hypermetabolic poorly marginated solid right axillary mass 2.5 x 2 cm with an SUV of 5.6.  No enlarged hypermetabolic  mediastinal or hilar lymph nodes no enlarged left axillary lymph nodes.  Subcentimeter lung nodules below PET resolution.  Small hypermetabolism in the right liver with an SUV of 6 without CT correlate equivocal for liver metastases.  Multiple hypermetabolic faintly lytic osseous lesions throughout the lumbar spine and bilateral pelvic girdle with representative supra-acetabular right iliac bone 1.9 cm lesion, anterior superior left acetabular 1.7 cm lesion and L1 1.2 cm lesion.   NGS testing showed no actionable mutations.  PD-L1 less than 1%.  CHEK2 S422 FS, PIK3CA N1068 FS, PIK3CA N345H, CCN E1 gain, ERB B2 1 P-CSF 1R fusion, FGF 19 gain, FGF 3 gain, FGF 4gain.   Ibrance plus letrozole started in February 2022.    Patient noted to have increase in CA 27-29 from 26 and November 2023 203 in July 2024.  CT chest abdomen pelvis with contrast and bone scan showed worsening metastatic disease in the bones especially in the thoracolumbar spine and right hemipelvis.    Peripheral blood NGS testing showed CHEK2 mutation, PIK3CA P.N1068 FS, PIK3CA P.N345H, PTEN P.R173S, ESR 1P.D538G, ESR 1P.L536R, PIK3CA P.R88Q.  MSI high not detected  Elacestrant started in sept 2024.  She has not been on fulvestrant so far      Interval history-patient will be running out of elacestrant in 1 day and she will not have it for about 2 weeks until her new insurance kicks in.  She is tolerating elacestrant well without any significant side effects.  She has noticed some increasing pain in her back and legs.  She  is using MS Contin and as needed Percocet which is still working well for her.  ECOG PS- 1 Pain scale- 4 Opioid associated constipation- no  Review of systems- Review of Systems  Constitutional:  Negative for chills, fever, malaise/fatigue and weight loss.  HENT:  Negative for congestion, ear discharge and nosebleeds.   Eyes:  Negative for blurred vision.  Respiratory:  Negative for cough, hemoptysis, sputum  production, shortness of breath and wheezing.   Cardiovascular:  Negative for chest pain, palpitations, orthopnea and claudication.  Gastrointestinal:  Negative for abdominal pain, blood in stool, constipation, diarrhea, heartburn, melena, nausea and vomiting.  Genitourinary:  Negative for dysuria, flank pain, frequency, hematuria and urgency.  Musculoskeletal:  Negative for back pain, joint pain and myalgias.  Skin:  Negative for rash.  Neurological:  Negative for dizziness, tingling, focal weakness, seizures, weakness and headaches.  Endo/Heme/Allergies:  Does not bruise/bleed easily.  Psychiatric/Behavioral:  Negative for depression and suicidal ideas. The patient does not have insomnia.       Allergies  Allergen Reactions   Kiwi Extract Itching and Swelling    The real kiwi fruit   Codeine Other (See Comments)    Felt funny.   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   Chicken pox    Depression    Fatty liver    Fatty liver    Gastric polyps    GERD (gastroesophageal reflux disease)    Gestational diabetes    prediabetes out side of having baby   Headache    migraines   Heart murmur    History of abnormal mammogram 08/2006, 01/2008, 03/2009   Hypertension    IBS (irritable bowel syndrome)    Joint disorder    hx of left ankle tendonitis, bursitis, heel spur   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 Smith/ laprascopic surgery   COLONOSCOPY  04/04/2000   COLONOSCOPY WITH PROPOFOL N/A 02/12/2019   Procedure: COLONOSCOPY WITH PROPOFOL;  Surgeon: Christena Deem, MD;  Location: [facility] ENDOSCOPY;  Service: Endoscopy;  Laterality: N/A;   ESOPHAGOGASTRODUODENOSCOPY (EGD) WITH PROPOFOL N/A  05/01/2015   Procedure: ESOPHAGOGASTRODUODENOSCOPY (EGD) WITH PROPOFOL;  Surgeon: Wallace Cullens, MD;  Location: [facility] ENDOSCOPY;  Service: Gastroenterology;  Laterality: N/A;   ESOPHAGOGASTRODUODENOSCOPY (EGD) WITH PROPOFOL N/A 02/12/2019   Procedure: ESOPHAGOGASTRODUODENOSCOPY (EGD) WITH PROPOFOL;  Surgeon: Christena Deem, MD;  Location: [facility] ENDOSCOPY;  Service: Endoscopy;  Laterality: N/A;   EYE SURGERY  08/2012   laser surgery on retina/ DR Appenzeller   FINGER SURGERY     repair of tendon in right index, Dr. Benay Pillow in [facility]   FOOT SURGERY     Dr. Al Corpus   IRRIGATION AND DEBRIDEMENT SEBACEOUS CYST     right upper back   LAPAROSCOPIC SUPRACERVICAL HYSTERECTOMY  06/2007   Dr Kincius/ adhesions/CPP/adenomyosis   MASTECTOMY PARTIAL / LUMPECTOMY Right 01/2008   with sentinal lymph node biopsy/ infiltrating ductal cancer   REPAIR PERONEAL TENDONS ANKLE  10/2019   with tarsal exostectomy and sural neuroma excision on right    SKIN CANCER EXCISION     back and calves   WISDOM TOOTH EXTRACTION  1991    Social History    Socioeconomic History   Marital status: Married    Spouse name: Brett Canales   Number of children: 1   Years of education: 14   Highest education level: Not on file  Occupational History   Occupation: CMA  Tobacco Use   Smoking status: Never   Smokeless tobacco: Never  Vaping Use   Vaping status: Never Used  Substance and Sexual Activity   Alcohol use: Yes    Alcohol/week: 0.0 standard drinks of alcohol    Comment: rare, 1-2 drinks per year   Drug use: No   Sexual activity: Yes    Partners: Male    Birth control/protection: Surgical    Comment: Hysterectomy   Other Topics Concern   Not on file  Social History Narrative   Lives in [place] with husband, no pets.  Works at [employer].      Diet - regular   Exercise - occasional   Social Drivers of Health   Financial Resource Strain: Low Risk  (11/23/2022)   Received from [facility], [facility]   Overall Financial Resource Strain (CARDIA)    Difficulty of Paying Living Expenses: Not hard at all  Food Insecurity: No Food Insecurity (11/23/2022)   Received from [facility], [facility]   Hunger Vital Sign    Worried About Running Out of Food in the Last Year: Never true    Ran Out of Food in the Last Year: Never true  Transportation Needs: No Transportation Needs (11/23/2022)   Received from [facility], [facility]   [facility] - Transportation    In the past 12 months, has lack of transportation kept you from medical appointments or from getting medications?: No    Lack of Transportation (Non-Medical): No  Physical Activity: Insufficiently Active (07/27/2017)   Exercise Vital Sign    Days of Exercise per Week: 4 days    Minutes of Exercise per Session: 30 min  Stress: No Stress Concern Present (07/27/2017)   [employer] of Occupational Health - Occupational Stress Questionnaire    Feeling of Stress : Only a little   Social Connections: Socially Integrated (07/27/2017)   Social Connection and Isolation Panel [NHANES]    Frequency of Communication with Friends and Family: More than three times a week    Frequency of Social Gatherings with Friends and Family: Once a  week    Attends Religious Services: More than 4 times per year    Active Member of Clubs or Organizations: Yes    Attends Banker Meetings: More than 4 times per year    Marital Status: Married  Catering manager Violence: Not At Risk (07/27/2017)   Humiliation, Afraid, Rape, and Kick questionnaire    Fear of Current or Ex-Partner: No    Emotionally Abused: No    Physically Abused: No    Sexually Abused: No    Family History  Problem Relation Age of Onset   Hypertension Mother    Thyroid disease Mother    Breast cancer Mother 40   Colon cancer Mother 13       again at 56   Atrial fibrillation Mother    Hip fracture Mother    Skin cancer Mother    Stroke Father    Hypertension Father    Cancer Father 44       prostate   Parkinson's disease Father    Skin cancer Father    Atrial fibrillation Sister        Pacemaker inserted end of Sept-beginning of Oct. 2024   Hypertension Sister    Thyroid disease Sister    Cancer Sister        skin/ squamous cell   Heart Problems Sister    Diabetes Maternal Aunt    Cancer Maternal Aunt    Breast cancer Maternal Aunt 5   Lymphoma Maternal Uncle    Heart disease Maternal Uncle    Melanoma Maternal Uncle 71   Lung cancer Paternal Aunt 65       smoker   Diabetes Maternal Grandmother    Stroke Maternal Grandfather    Heart disease Paternal Grandfather    Thyroid cancer Cousin        maternal   Breast cancer Cousin 60       maternal   Breast cancer Cousin 18   Colon cancer Cousin      Current Outpatient Medications:    aspirin EC 81 MG tablet, Take by mouth., Disp: , Rfl:    b complex vitamins capsule, Take 1 capsule by mouth daily., Disp: , Rfl:    buPROPion  (WELLBUTRIN XL) 150 MG 24 hr tablet, Take 1 tablet (150 mg total) by mouth daily. Take along a 300mg  tablet for a total daily dose of 450mg ., Disp: 90 tablet, Rfl: 3   buPROPion (WELLBUTRIN XL) 300 MG 24 hr tablet, Take 1 tablet (300 mg total) by mouth daily., Disp: 90 tablet, Rfl: 3   busPIRone (BUSPAR) 10 MG tablet, Take 1 tablet (10 mg total) by mouth 2 (two) times daily, Disp: 180 tablet, Rfl: 1   calcium carbonate (OSCAL) 1500 (600 Ca) MG TABS tablet, Take by mouth 2 (two) times daily with a meal., Disp: , Rfl:    Cholecalciferol (VITAMIN D3) 10 MCG (400 UNIT) CAPS, Take by mouth., Disp: , Rfl:    citalopram (CELEXA) 40 MG tablet, Take 1 tablet (40 mg total) by mouth daily., Disp: 90 tablet, Rfl: 3   diphenhydrAMINE (BENADRYL) 25 MG tablet, Take 25 mg by mouth at bedtime as needed for sleep. Pt states daily now for sleep and allergies., Disp: , Rfl:    gabapentin (NEURONTIN) 100 MG capsule, Take 1 capsule (100 mg total) by mouth every morning AND 2 capsules (200 mg total) every evening., Disp: 270 capsule, Rfl: 1   ketotifen (ZADITOR) 0.025 % ophthalmic solution, Place 1 drop into both  eyes daily., Disp: , Rfl:    lisinopril (ZESTRIL) 10 MG tablet, Take 1 tablet (10 mg total) by mouth daily., Disp: 90 tablet, Rfl: 1   loperamide (IMODIUM) 2 MG capsule, Take 2 tabs by mouth with first loose stool, then 1 tab with each additional loose stool as needed. Do not exceed 8 tabs in a 24-hour period, Disp: 30 capsule, Rfl: 0   metFORMIN (GLUCOPHAGE-XR) 500 MG 24 hr tablet, Take 1 tablet (500 mg total) by mouth 2 (two) times daily with a meal., Disp: 180 tablet, Rfl: 1   morphine (MS CONTIN) 15 MG 12 hr tablet, Take 1 tablet (15 mg total) by mouth every 12 (twelve) hours., Disp: 60 tablet, Rfl: 0   Multiple Vitamin (MULTIVITAMIN) tablet, Take 1 tablet by mouth daily., Disp: , Rfl:    omeprazole (PRILOSEC) 20 MG capsule, Take 1 capsule (20 mg total) by mouth daily., Disp: 90 capsule, Rfl: 3   ondansetron  (ZOFRAN) 4 MG tablet, Take 1 tablet (4 mg total) by mouth every 8 (eight) hours as needed for nausea or vomiting., Disp: 90 tablet, Rfl: 0   ondansetron (ZOFRAN) 8 MG tablet, Take 1 tablet (8 mg total) by mouth every 8 (eight) hours as needed for nausea or vomiting., Disp: 30 tablet, Rfl: 1   ORSERDU 86 MG tablet, TAKE 3 TABLETS BY MOUTH ONCE DAILY, Disp: 90 tablet, Rfl: 0   oxyCODONE-acetaminophen (PERCOCET/ROXICET) 5-325 MG tablet, Take 1 tablet by mouth every 6 (six) hours as needed for severe pain., Disp: 30 tablet, Rfl: 0   prochlorperazine (COMPAZINE) 10 MG tablet, Take 1 tablet (10 mg total) by mouth every 6 (six) hours as needed for nausea or vomiting., Disp: 30 tablet, Rfl: 1  Physical exam:  Vitals:   08/26/23 1018  BP: 123/74  Pulse: 75  Resp: 18  Temp: (!) 97.2 F (36.2 C)  TempSrc: Tympanic  SpO2: 98%  Weight: 243 lb (110.2 kg)   Physical Exam      Latest Ref Rng & Units 08/26/2023    9:47 AM  CMP  Glucose 70 - 99 mg/dL 161   BUN 6 - 20 mg/dL 18   Creatinine 0.96 - 1.00 mg/dL 0.45   Sodium 409 - 811 mmol/L 139   Potassium 3.5 - 5.1 mmol/L 3.9   Chloride 98 - 111 mmol/L 107   CO2 22 - 32 mmol/L 20   Calcium 8.9 - 10.3 mg/dL 9.3   Total Protein 6.5 - 8.1 g/dL 7.3   Total Bilirubin 0.0 - 1.2 mg/dL 0.5   Alkaline Phos 38 - 126 U/L 86   AST 15 - 41 U/L 25   ALT 0 - 44 U/L 21       Latest Ref Rng & Units 08/26/2023    9:47 AM  CBC  WBC 4.0 - 10.5 K/uL 6.3   Hemoglobin 12.0 - 15.0 g/dL 91.4   Hematocrit 78.2 - 46.0 % 38.0   Platelets 150 - 400 K/uL 226     No images are attached to the encounter.  No results found.   Assessment and plan- Patient is a 63 y.o. female with metastatic ER/PR positive HER2 negative breast cancer with Bone metastases currently on elacestrant here for routine follow-up  After starting elacestrant her CA 27-29 had gone up from 103 up to 185 but then it trended down to 145.  I have therefore continue medication without any change  although CT scans and December 2024 showed concern for disease progression on bone scan.  Patient's tumor marker has come back again back at 187 which is higher as compared to her prior value of 145.  I will plan to move up her scan to be done in the next 1 week to 10 days.  If there is evidence of disease progression I will switch her to Faslodex plus capivasertib I will tentatively see her back in 1 month with labs to discuss scans.  Patient receives Zometa every 3 months and will be due for her next dose in March   Visit Diagnosis 1. Primary malignant neoplasm of breast with metastasis (HCC)   2. High risk medication use   3. Neoplasm related pain      Dr. Owens Shark, MD, MPH [facility] [phone] 08/28/2023 9:31 AM

## 2023-08-29 ENCOUNTER — Telehealth: Payer: Self-pay | Admitting: Pharmacy Technician

## 2023-08-29 ENCOUNTER — Other Ambulatory Visit (HOSPITAL_COMMUNITY): Payer: Self-pay

## 2023-08-29 NOTE — Telephone Encounter (Signed)
Oral Oncology Patient Advocate Encounter  Started process for PAP but spoke with patient and stated that they have 14 days worth of medication on hand and would prefer to wait until her new insurance is active. Advised pt to call me at my office number when she has new insurance in hand.   [name] Almedia Balls, CPhT Oncology Pharmacy Patient Advocate [facility] Direct Number: [phone] Fax: [phone]

## 2023-08-30 ENCOUNTER — Encounter: Payer: Self-pay | Admitting: Oncology

## 2023-08-30 ENCOUNTER — Other Ambulatory Visit (HOSPITAL_COMMUNITY): Payer: Self-pay

## 2023-09-07 ENCOUNTER — Other Ambulatory Visit (HOSPITAL_COMMUNITY): Payer: Self-pay

## 2023-09-09 ENCOUNTER — Encounter: Payer: Self-pay | Admitting: Oncology

## 2023-09-09 ENCOUNTER — Other Ambulatory Visit (HOSPITAL_COMMUNITY): Payer: Self-pay

## 2023-09-09 MED ORDER — CITALOPRAM HYDROBROMIDE 40 MG PO TABS
40.0000 mg | ORAL_TABLET | Freq: Every day | ORAL | 3 refills | Status: AC
  Filled 2023-09-09 – 2023-09-10 (×2): qty 90, 90d supply, fill #0
  Filled 2023-09-19 – 2023-12-05 (×2): qty 90, 90d supply, fill #1
  Filled 2024-03-05: qty 90, 90d supply, fill #2
  Filled 2024-06-02: qty 90, 90d supply, fill #3

## 2023-09-10 ENCOUNTER — Encounter: Payer: Self-pay | Admitting: Oncology

## 2023-09-10 ENCOUNTER — Other Ambulatory Visit (HOSPITAL_COMMUNITY): Payer: Self-pay

## 2023-09-12 ENCOUNTER — Encounter: Payer: Self-pay | Admitting: Oncology

## 2023-09-12 ENCOUNTER — Other Ambulatory Visit (HOSPITAL_COMMUNITY): Payer: Self-pay

## 2023-09-12 ENCOUNTER — Telehealth: Payer: Self-pay | Admitting: Pharmacy Technician

## 2023-09-12 ENCOUNTER — Other Ambulatory Visit: Payer: Self-pay

## 2023-09-12 NOTE — Telephone Encounter (Signed)
 Oral Oncology Patient Advocate Encounter  Prior Authorization for Orserdu has been approved.    PA# [id number] Effective dates: 09/11/2023 through 09/10/2024  Patients co-pay is $9,091.00.    [name] Almedia Balls, CPhT Oncology Pharmacy Patient Advocate [facility] Direct Number: [phone] Fax: [phone]

## 2023-09-12 NOTE — Telephone Encounter (Signed)
 Oral Oncology Patient Advocate Encounter   Received notification that prior authorization for Orserdu is required.   PA submitted on 09/12/2023 Key  [id number] Status is pending     [name] Almedia Balls, CPhT Oncology Pharmacy Patient Advocate [facility] Direct Number: [phone] Fax: [phone]

## 2023-09-12 NOTE — Telephone Encounter (Signed)
 Oral Oncology Patient Advocate Encounter   Was successful in obtaining a copay card for Orserdu.  This copay card will make the patients copay $0.  The billing information is as follows and has been shared with [employer].   RxBin: [id number] PCN: [id number] Member ID: [id number] Group ID: [id number]   Ella Bodo, CPhT Oncology Pharmacy Patient Advocate [facility] Direct Number: [phone] Fax: [phone]

## 2023-09-13 NOTE — Telephone Encounter (Signed)
 Spoke with patient on 09/13/23, she is aware of the approval through her new insurance and has already spoken to [employer] to set- up med delivery.

## 2023-09-14 ENCOUNTER — Encounter: Payer: Self-pay | Admitting: Oncology

## 2023-09-15 ENCOUNTER — Encounter: Payer: Self-pay | Admitting: Oncology

## 2023-09-16 ENCOUNTER — Ambulatory Visit
Admission: RE | Admit: 2023-09-16 | Discharge: 2023-09-16 | Disposition: A | Discharge status: Home / Self Care | Payer: Self-pay | Source: Ambulatory Visit | Attending: Oncology | Admitting: Oncology

## 2023-09-16 DIAGNOSIS — C50919 Malignant neoplasm of unspecified site of unspecified female breast: Secondary | ICD-10-CM | POA: Diagnosis present

## 2023-09-16 MED ORDER — IOHEXOL 300 MG/ML  SOLN
100.0000 mL | Freq: Once | INTRAMUSCULAR | Status: AC | PRN
  Administered 2023-09-16: 100 mL via INTRAVENOUS

## 2023-09-16 MED ORDER — TECHNETIUM TC 99M MEDRONATE IV KIT
20.0000 | PACK | Freq: Once | INTRAVENOUS | Status: AC | PRN
  Administered 2023-09-16: 21.22 via INTRAVENOUS

## 2023-09-19 ENCOUNTER — Other Ambulatory Visit (HOSPITAL_COMMUNITY): Payer: Self-pay

## 2023-09-19 ENCOUNTER — Other Ambulatory Visit: Payer: Self-pay

## 2023-09-20 ENCOUNTER — Encounter: Payer: Self-pay | Admitting: Oncology

## 2023-09-21 ENCOUNTER — Other Ambulatory Visit: Payer: Self-pay

## 2023-09-21 MED ORDER — MORPHINE SULFATE ER 15 MG PO TBCR
15.0000 mg | EXTENDED_RELEASE_TABLET | Freq: Two times a day (BID) | ORAL | 0 refills | Status: DC
Start: 2023-09-21 — End: 2023-10-10
  Filled 2023-09-21: qty 60, 30d supply, fill #0

## 2023-09-21 MED ORDER — OXYCODONE-ACETAMINOPHEN 5-325 MG PO TABS
1.0000 | ORAL_TABLET | Freq: Four times a day (QID) | ORAL | 0 refills | Status: DC | PRN
  Filled 2023-09-21: qty 30, 8d supply, fill #0

## 2023-09-26 ENCOUNTER — Encounter: Payer: Self-pay | Admitting: Oncology

## 2023-09-26 ENCOUNTER — Inpatient Hospital Stay: Payer: Self-pay

## 2023-09-26 ENCOUNTER — Inpatient Hospital Stay: Payer: Self-pay | Attending: Oncology

## 2023-09-26 ENCOUNTER — Inpatient Hospital Stay (HOSPITAL_BASED_OUTPATIENT_CLINIC_OR_DEPARTMENT_OTHER): Payer: Self-pay | Admitting: Oncology

## 2023-09-26 VITALS — BP 129/71 | HR 72 | Temp 98.5°F | Resp 18 | Ht 63.0 in | Wt 243.4 lb

## 2023-09-26 DIAGNOSIS — C50919 Malignant neoplasm of unspecified site of unspecified female breast: Secondary | ICD-10-CM

## 2023-09-26 DIAGNOSIS — Z79899 Other long term (current) drug therapy: Secondary | ICD-10-CM | POA: Diagnosis not present

## 2023-09-26 DIAGNOSIS — G4700 Insomnia, unspecified: Secondary | ICD-10-CM

## 2023-09-26 DIAGNOSIS — Z170 Estrogen receptor positive status [ER+]: Secondary | ICD-10-CM | POA: Insufficient documentation

## 2023-09-26 DIAGNOSIS — R630 Anorexia: Secondary | ICD-10-CM | POA: Diagnosis not present

## 2023-09-26 DIAGNOSIS — G893 Neoplasm related pain (acute) (chronic): Secondary | ICD-10-CM

## 2023-09-26 DIAGNOSIS — Z853 Personal history of malignant neoplasm of breast: Secondary | ICD-10-CM | POA: Diagnosis not present

## 2023-09-26 DIAGNOSIS — C7951 Secondary malignant neoplasm of bone: Secondary | ICD-10-CM | POA: Diagnosis present

## 2023-09-26 DIAGNOSIS — Z1501 Genetic susceptibility to malignant neoplasm of breast: Secondary | ICD-10-CM | POA: Insufficient documentation

## 2023-09-26 LAB — CBC WITH DIFFERENTIAL (CANCER CENTER ONLY)
Abs Immature Granulocytes: 0.03 10*3/uL (ref 0.00–0.07)
Basophils Absolute: 0 10*3/uL (ref 0.0–0.1)
Basophils Relative: 1 %
Eosinophils Absolute: 0.1 10*3/uL (ref 0.0–0.5)
Eosinophils Relative: 1 %
HCT: 37.2 % (ref 36.0–46.0)
Hemoglobin: 11.4 g/dL — ABNORMAL LOW (ref 12.0–15.0)
Immature Granulocytes: 1 %
Lymphocytes Relative: 19 %
Lymphs Abs: 1.2 10*3/uL (ref 0.7–4.0)
MCH: 25.9 pg — ABNORMAL LOW (ref 26.0–34.0)
MCHC: 30.6 g/dL (ref 30.0–36.0)
MCV: 84.5 fL (ref 80.0–100.0)
Monocytes Absolute: 0.7 10*3/uL (ref 0.1–1.0)
Monocytes Relative: 10 %
Neutro Abs: 4.4 10*3/uL (ref 1.7–7.7)
Neutrophils Relative %: 68 %
Platelet Count: 237 10*3/uL (ref 150–400)
RBC: 4.4 MIL/uL (ref 3.87–5.11)
RDW: 17.1 % — ABNORMAL HIGH (ref 11.5–15.5)
WBC Count: 6.5 10*3/uL (ref 4.0–10.5)
nRBC: 0 % (ref 0.0–0.2)

## 2023-09-26 LAB — CMP (CANCER CENTER ONLY)
ALT: 18 U/L (ref 0–44)
AST: 24 U/L (ref 15–41)
Albumin: 3.6 g/dL (ref 3.5–5.0)
Alkaline Phosphatase: 88 U/L (ref 38–126)
Anion gap: 11 (ref 5–15)
BUN: 14 mg/dL (ref 6–20)
CO2: 22 mmol/L (ref 22–32)
Calcium: 9 mg/dL (ref 8.9–10.3)
Chloride: 103 mmol/L (ref 98–111)
Creatinine: 1.14 mg/dL — ABNORMAL HIGH (ref 0.44–1.00)
GFR, Estimated: 55 mL/min — ABNORMAL LOW (ref >60)
Glucose, Bld: 116 mg/dL — ABNORMAL HIGH (ref 70–99)
Potassium: 4 mmol/L (ref 3.5–5.1)
Sodium: 136 mmol/L (ref 135–145)
Total Bilirubin: 0.6 mg/dL (ref 0.0–1.2)
Total Protein: 7.2 g/dL (ref 6.5–8.1)

## 2023-09-26 MED ORDER — ZOLEDRONIC ACID 4 MG/100ML IV SOLN: 4.0000 mg | INTRAVENOUS | Status: DC

## 2023-09-26 NOTE — Progress Notes (Addendum)
 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  It was a 1.4 cm tumor with negative lymph nodes grade 1 and patient went on to get lumpectomy followed by adjuvant radiation therapy.  Oncotype DX score was 6 and she did not require adjuvant chemotherapy.  She was on tamoxifen for 5 years.  She had BRCA testing done at that time which was negative.   Patient felt a lump in her right axilla in September 2021.  She underwent ultrasound of bilateral breasts which did not pick up any axillary mass.  A prior screening mammogram in September 2021 was unremarkable.  She was then admitted for Covid pneumonia and underwent CT angio chest on 07/22/2020 which incidentally picked up an axillary mass measuring 2.5 cm.  No obvious breast mass.  Axillary mass was biopsied and was consistent with high-grade invasive mammary carcinoma.  IHC was positive for GATA3 with diffuse strong nuclear staining.  There was no lymph node architecture identified in the sample and it could not be determined if it is a primary tumor within the axillary breast tissue or metastases.  ER strongly positive greater than 90%, PR 1 to 10% positive and HER-2 negative   PET CT scan showed hypermetabolic poorly marginated solid right axillary mass 2.5 x 2 cm with an SUV of 5.6.  No enlarged hypermetabolic  mediastinal or hilar lymph nodes no enlarged left axillary lymph nodes.  Subcentimeter lung nodules below PET resolution.  Small hypermetabolism in the right liver with an SUV of 6 without CT correlate equivocal for liver metastases.  Multiple hypermetabolic faintly lytic osseous lesions throughout the lumbar spine and bilateral pelvic girdle with representative supra-acetabular right iliac bone 1.9 cm lesion, anterior superior left acetabular 1.7 cm lesion and L1 1.2 cm lesion.   NGS testing showed no actionable mutations.  PD-L1 less than 1%.  CHEK2 S422 FS, PIK3CA N1068 FS, PIK3CA N345H, CCN E1 gain, ERB B2 1 P-CSF 1R fusion, FGF 19 gain, FGF 3 gain, FGF 4gain.   Ibrance plus letrozole started in February 2022.    Patient noted to have increase in CA 27-29 from 26 and November 2023 203 in July 2024.  CT chest abdomen pelvis with contrast and bone scan showed worsening metastatic disease in the bones especially in the thoracolumbar spine and right hemipelvis.    Peripheral blood NGS testing showed CHEK2 mutation, PIK3CA P.N1068 FS, PIK3CA P.N345H, PTEN P.R173S, ESR 1P.D538G, ESR 1P.L536R, PIK3CA P.R88Q.  MSI high not detected  Elacestrant started in sept 2024.  She has not been on fulvestrant so far    Interval history-reports increasing trouble sleeping.  Appetite is fair And she has lost 7 pounds in the last 3 months.  Reports occasional headaches.   ECOG PS- 1 Pain scale- 3 Opioid associated constipation- no  Review of systems- Review of Systems  Constitutional:  Negative for chills, fever, malaise/fatigue and weight loss.  HENT:  Negative for congestion, ear discharge and nosebleeds.   Eyes:  Negative for blurred vision.  Respiratory:  Negative for cough, hemoptysis, sputum production, shortness of breath and wheezing.   Cardiovascular:  Negative for chest pain, palpitations, orthopnea and claudication.  Gastrointestinal:  Negative for abdominal pain, blood in stool, constipation,  diarrhea, heartburn, melena, nausea and vomiting.  Genitourinary:  Negative for dysuria, flank pain, frequency, hematuria and urgency.  Musculoskeletal:  Negative for back pain, joint pain and myalgias.  Skin:  Negative for rash.  Neurological:  Negative for dizziness, tingling, focal weakness, seizures, weakness and headaches.  Endo/Heme/Allergies:  Does not bruise/bleed easily.  Psychiatric/Behavioral:  Negative for depression and suicidal ideas. The patient does not have insomnia.       Allergies  Allergen Reactions   Kiwi Extract Itching and Swelling    The real kiwi fruit   Codeine Other (See Comments)    Felt funny.   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   Chicken pox    Depression    Fatty liver    Fatty liver    Gastric polyps    GERD (gastroesophageal reflux disease)    Gestational diabetes    prediabetes out side of having baby   Headache    migraines   Heart murmur    History of abnormal mammogram 08/2006, 01/2008, 03/2009   Hypertension    IBS (irritable bowel syndrome)    Joint disorder    hx of left ankle tendonitis, bursitis, heel spur   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 Smith/ laprascopic surgery   COLONOSCOPY  04/04/2000   COLONOSCOPY WITH PROPOFOL N/A 02/12/2019   Procedure: COLONOSCOPY WITH PROPOFOL;  Surgeon: Christena Deem, MD;  Location: [facility] ENDOSCOPY;  Service: Endoscopy;  Laterality: N/A;   ESOPHAGOGASTRODUODENOSCOPY (EGD) WITH PROPOFOL N/A 05/01/2015   Procedure: ESOPHAGOGASTRODUODENOSCOPY (EGD) WITH PROPOFOL;  Surgeon: Wallace Cullens, MD;  Location: [facility] ENDOSCOPY;  Service: Gastroenterology;  Laterality: N/A;   ESOPHAGOGASTRODUODENOSCOPY (EGD) WITH PROPOFOL N/A 02/12/2019   Procedure: ESOPHAGOGASTRODUODENOSCOPY (EGD) WITH PROPOFOL;  Surgeon: Christena Deem, MD;  Location: [facility] ENDOSCOPY;  Service: Endoscopy;  Laterality: N/A;   EYE SURGERY  08/2012   laser surgery on retina/ DR Appenzeller   FINGER SURGERY     repair of tendon in right index, Dr. Benay Pillow in [place] [facility]   FOOT SURGERY     Dr. Al Corpus   IRRIGATION AND DEBRIDEMENT SEBACEOUS CYST     right upper back   LAPAROSCOPIC SUPRACERVICAL HYSTERECTOMY  06/2007   Dr Kincius/ adhesions/CPP/adenomyosis   MASTECTOMY PARTIAL / LUMPECTOMY Right 01/2008   with sentinal lymph node biopsy/ infiltrating ductal cancer   REPAIR PERONEAL TENDONS ANKLE  10/2019   with tarsal exostectomy and sural neuroma excision on right    SKIN CANCER EXCISION     back and calves   WISDOM TOOTH EXTRACTION  1991    Social History   Socioeconomic History   Marital status: Married    Spouse name: Brett Canales   Number of children: 1   Years of education: 14   Highest education level: Not on file  Occupational History   Occupation: CMA  Tobacco Use    Smoking status: Never   Smokeless tobacco: Never  Vaping Use   Vaping status: Never Used  Substance and Sexual Activity   Alcohol use: Yes    Alcohol/week: 0.0 standard drinks of alcohol    Comment: rare, 1-2 drinks per year   Drug use: No   Sexual activity: Yes    Partners: Male    Birth control/protection: Surgical    Comment: Hysterectomy   Other Topics Concern   Not on file  Social History Narrative   Lives in [place] with husband, no pets.  Works at [employer].      Diet - regular   Exercise - occasional   Social Drivers of Health   Financial Resource Strain: Low Risk  (11/23/2022)   Received from [facility], [facility]   Overall Financial Resource Strain (CARDIA)    Difficulty of Paying Living Expenses: Not hard at all  Food Insecurity: No Food Insecurity (11/23/2022)   Received from [facility], [facility]   Hunger Vital Sign    Worried About Running Out of Food in the Last Year: Never true    Ran Out of Food in the Last Year: Never true  Transportation Needs: No Transportation Needs (11/23/2022)   Received from [facility], [facility]   [facility] - Transportation    In the past 12 months, has lack of transportation kept you from medical appointments or from getting medications?: No    Lack of Transportation (Non-Medical): No  Physical Activity: Insufficiently Active (07/27/2017)   Exercise Vital Sign    Days of Exercise per Week: 4 days    Minutes of Exercise per Session: 30 min  Stress: No Stress Concern Present (07/27/2017)   [employer] of Occupational Health - Occupational Stress Questionnaire    Feeling of Stress : Only a little  Social Connections: Socially Integrated (07/27/2017)   Social Connection and Isolation Panel [NHANES]    Frequency of Communication with Friends and Family: More than three times a week    Frequency of Social Gatherings  with Friends and Family: Once a week    Attends Religious Services: More than 4 times per year    Active Member of [employer] or Organizations: Yes    Attends Engineer, structural: More than 4 times per year    Marital Status: Married  Catering manager  Violence: Not At Risk (07/27/2017)   Humiliation, Afraid, Rape, and Kick questionnaire    Fear of Current or Ex-Partner: No    Emotionally Abused: No    Physically Abused: No    Sexually Abused: No    Family History  Problem Relation Age of Onset   Hypertension Mother    Thyroid disease Mother    Breast cancer Mother 23   Colon cancer Mother 5       again at 110   Atrial fibrillation Mother    Hip fracture Mother    Skin cancer Mother    Stroke Father    Hypertension Father    Cancer Father 58       prostate   Parkinson's disease Father    Skin cancer Father    Atrial fibrillation Sister        Pacemaker inserted end of Sept-beginning of Oct. 2024   Hypertension Sister    Thyroid disease Sister    Cancer Sister        skin/ squamous cell   Heart Problems Sister    Diabetes Maternal Aunt    Cancer Maternal Aunt    Breast cancer Maternal Aunt 64   Lymphoma Maternal Uncle    Heart disease Maternal Uncle    Melanoma Maternal Uncle 19   Lung cancer Paternal Aunt 40       smoker   Diabetes Maternal Grandmother    Stroke Maternal Grandfather    Heart disease Paternal Grandfather    Thyroid cancer Cousin        maternal   Breast cancer Cousin 60       maternal   Breast cancer Cousin 66   Colon cancer Cousin      Current Outpatient Medications:    aspirin EC 81 MG tablet, Take by mouth., Disp: , Rfl:    b complex vitamins capsule, Take 1 capsule by mouth daily., Disp: , Rfl:    buPROPion (WELLBUTRIN XL) 150 MG 24 hr tablet, Take 1 tablet (150 mg total) by mouth daily. Take along a 300mg  tablet for a total daily dose of 450mg ., Disp: 90 tablet, Rfl: 3   buPROPion (WELLBUTRIN XL) 300 MG 24 hr tablet, Take 1 tablet  (300 mg total) by mouth daily., Disp: 90 tablet, Rfl: 3   busPIRone (BUSPAR) 10 MG tablet, Take 1 tablet (10 mg total) by mouth 2 (two) times daily, Disp: 180 tablet, Rfl: 1   calcium carbonate (OSCAL) 1500 (600 Ca) MG TABS tablet, Take by mouth 2 (two) times daily with a meal., Disp: , Rfl:    Cholecalciferol (VITAMIN D3) 10 MCG (400 UNIT) CAPS, Take by mouth., Disp: , Rfl:    citalopram (CELEXA) 40 MG tablet, Take 1 tablet (40 mg total) by mouth daily., Disp: 90 tablet, Rfl: 3   diphenhydrAMINE (BENADRYL) 25 MG tablet, Take 25 mg by mouth at bedtime as needed for sleep. Pt states daily now for sleep and allergies., Disp: , Rfl:    gabapentin (NEURONTIN) 100 MG capsule, Take 1 capsule (100 mg total) by mouth every morning AND 2 capsules (200 mg total) every evening., Disp: 270 capsule, Rfl: 1   ketotifen (ZADITOR) 0.025 % ophthalmic solution, Place 1 drop into both eyes daily., Disp: , Rfl:    lisinopril (ZESTRIL) 10 MG tablet, Take 1 tablet (10 mg total) by mouth daily., Disp: 90 tablet, Rfl: 1   loperamide (IMODIUM) 2 MG capsule, Take 2 tabs by mouth with first loose stool, then 1 tab  with each additional loose stool as needed. Do not exceed 8 tabs in a 24-hour period, Disp: 30 capsule, Rfl: 0   metFORMIN (GLUCOPHAGE-XR) 500 MG 24 hr tablet, Take 1 tablet (500 mg total) by mouth 2 (two) times daily with a meal., Disp: 180 tablet, Rfl: 1   morphine (MS CONTIN) 15 MG 12 hr tablet, Take 1 tablet (15 mg total) by mouth every 12 (twelve) hours., Disp: 60 tablet, Rfl: 0   Multiple Vitamin (MULTIVITAMIN) tablet, Take 1 tablet by mouth daily., Disp: , Rfl:    omeprazole (PRILOSEC) 20 MG capsule, Take 1 capsule (20 mg total) by mouth daily., Disp: 90 capsule, Rfl: 3   ondansetron (ZOFRAN) 4 MG tablet, Take 1 tablet (4 mg total) by mouth every 8 (eight) hours as needed for nausea or vomiting., Disp: 90 tablet, Rfl: 0   ondansetron (ZOFRAN) 8 MG tablet, Take 1 tablet (8 mg total) by mouth every 8 (eight)  hours as needed for nausea or vomiting., Disp: 30 tablet, Rfl: 1   ORSERDU 86 MG tablet, TAKE 3 TABLETS BY MOUTH ONCE DAILY, Disp: 90 tablet, Rfl: 0   oxyCODONE-acetaminophen (PERCOCET/ROXICET) 5-325 MG tablet, Take 1 tablet by mouth every 6 (six) hours as needed for severe pain., Disp: 30 tablet, Rfl: 0   prochlorperazine (COMPAZINE) 10 MG tablet, Take 1 tablet (10 mg total) by mouth every 6 (six) hours as needed for nausea or vomiting., Disp: 30 tablet, Rfl: 1  Physical exam:  Vitals:   09/26/23 1028  BP: 129/71  Pulse: 72  Resp: 18  Temp: 98.5 F (36.9 C)  TempSrc: Tympanic  SpO2: 99%  Weight: 243 lb 6.4 oz (110.4 kg)  Height: 5\' 3"  (1.6 m)   Physical Exam Cardiovascular:     Rate and Rhythm: Normal rate and regular rhythm.     Heart sounds: Normal heart sounds.  Pulmonary:     Effort: Pulmonary effort is normal.     Breath sounds: Normal breath sounds.  Skin:    General: Skin is warm and dry.  Neurological:     Mental Status: She is alert and oriented to person, place, and time.         Latest Ref Rng & Units 09/26/2023   10:02 AM  CMP  Glucose 70 - 99 mg/dL 409   BUN 6 - 20 mg/dL 14   Creatinine 8.11 - 1.00 mg/dL 9.14   Sodium 782 - 956 mmol/L 136   Potassium 3.5 - 5.1 mmol/L 4.0   Chloride 98 - 111 mmol/L 103   CO2 22 - 32 mmol/L 22   Calcium 8.9 - 10.3 mg/dL 9.0   Total Protein 6.5 - 8.1 g/dL 7.2   Total Bilirubin 0.0 - 1.2 mg/dL 0.6   Alkaline Phos 38 - 126 U/L 88   AST 15 - 41 U/L 24   ALT 0 - 44 U/L 18       Latest Ref Rng & Units 09/26/2023   10:01 AM  CBC  WBC 4.0 - 10.5 K/uL 6.5   Hemoglobin 12.0 - 15.0 g/dL 21.3   Hematocrit 08.6 - 46.0 % 37.2   Platelets 150 - 400 K/uL 237     No images are attached to the encounter.  CT CHEST ABDOMEN PELVIS W CONTRAST Result Date: 09/21/2023 CLINICAL DATA:  Right breast cancer with skeletal metastatic disease, restaging * Tracking Code: BO * EXAM: CT CHEST, ABDOMEN, AND PELVIS WITH CONTRAST TECHNIQUE:  Multidetector CT imaging of the chest, abdomen and pelvis was  performed following the standard protocol during bolus administration of intravenous contrast. RADIATION DOSE REDUCTION: This exam was performed according to the departmental dose-optimization program which includes automated exposure control, adjustment of the mA and/or kV according to patient size and/or use of iterative reconstruction technique. CONTRAST:  OMNIPAQUE IOHEXOL 300 MG/ML  SOLN COMPARISON:  06/16/2023 FINDINGS: CT CHEST FINDINGS Cardiovascular: Borderline cardiomegaly. Mediastinum/Nodes: Unremarkable Lungs/Pleura: Chronic small Peri fissural lymph nodes on the right side, considered benign. Suspected calcified granuloma anteriorly in the left lower lobe, stable. Musculoskeletal: Right mastectomy. Degenerative glenohumeral arthropathy bilaterally. Mixed density lesions compatible with prior metastatic disease involving the ribs and bilateral scapula. Sclerotic and mixed density metastatic lesions involving the thoracic spine. Overall these lesions appear stable compared to the 06/16/2023 exam, aside from mildly progressive sclerosis of the dominant inferior T12 vertebral body lesion which is likely attributable to treatment effect. CT ABDOMEN PELVIS FINDINGS Hepatobiliary: Cholecystectomy.  Otherwise unremarkable. 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 No further imaging workup of this lesion is indicated. Adrenal glands unremarkable.  Urinary bladder unremarkable. Stomach/Bowel: Mild sigmoid colon diverticulosis. Prominent stool throughout the colon favors constipation. Vascular/Lymphatic: Unremarkable Reproductive: Prior hysterectomy although possible retained cervix. Chronically stable appearance without hypermetabolic activity in this vicinity on the prior PET-CT of 08/18/2020. Other: No supplemental non-categorized findings. Musculoskeletal:  Scattered primarily sclerotic metastatic disease in the lumbar spine and bony pelvis, most confluent in the right iliac bone, overall similar distribution compared to the prior exam. A lucent lesion of the left greater trochanter is stable. IMPRESSION: 1. Generally stable appearance of skeletal metastatic disease. No findings of active metastatic disease. 2. Borderline cardiomegaly. 3. Prominent stool throughout the colon favors constipation. 4. Mild sigmoid colon diverticulosis. Electronically Signed   By: Gaylyn Rong M.D.   On: 09/21/2023 15:57   NM Bone Scan Whole Body Result Date: 09/21/2023 CLINICAL DATA:  Carcinoma of right breast metastatic to bone (HCC) EXAM: NUCLEAR MEDICINE WHOLE BODY BONE SCAN TECHNIQUE: Whole body anterior and posterior images were obtained approximately 3 hours after intravenous injection of radiopharmaceutical. RADIOPHARMACEUTICALS:  21.2 mCi Technetium-34m MDP IV COMPARISON:  06/16/2023 FINDINGS: Physiologic distribution of radiotracer with bilateral renal uptake and excretion. Similar pattern of abnormal radiotracer accumulation within the axial and appendicular skeleton compatible with known osseous metastatic disease. This includes multiple sites within the thoracic and lumbar spine, multiple bilateral ribs, calvarium, and right greater than left hemipelvis. Similar degree of degenerative-type uptake affecting multiple joints. IMPRESSION: Multifocal osseous metastatic disease, similar in degree and distribution compared to the previous study. Electronically Signed   By: Duanne Guess D.O.   On: 09/21/2023 15:31     Assessment and plan- Patient is a 63 y.o. female with metastatic ER/PR positive HER2 negative breast cancer with Bone metastases.  She is currently on elacestrant  this is a routine follow-up visit  Patient was started on elacestrant  in September 2024.  Her tumor marker CA 27-29 has been waxing and waning between 140s to 180s since then.  She had a CT  chest abdomen and pelvis with contrast and a bone scan in March 2025 which overall shows stable evidence of bony metastatic disease and no evidence of overt disease progression.  I have reviewed CT chest abdomen pelvis images independently.  I plan is to continue with the elacestrant  until progression or toxicity.  Repeat scans again in 3 months.  She will get Zometa at that time as well  Bone metastases:  She receives Zometa every 3 months and is due for it today but due to change in her insurance we are waiting for repeat approval for the same.  Insomnia: She wants to try over-the-counter remedies first have asked her to try magnesium glycinate 500 mg 1 to 2 tablets daily.  If it does not help we could consider giving her Remeron which can also help with her appetite along with sleep.  She will let us know if magnesium is working for her.  Neoplasm related pain: She will continue with long-acting morphine and as needed oxycodone.  She does not wish to make any changes to her pain medications at this time  Patient was noted to have elevated TSH of 9 back in September 2024 and we will repeated again in 6 weeks time.  She is not presently on any thyroid medication   Visit Diagnosis 1. Primary malignant neoplasm of breast with metastasis (HCC)   2. High risk medication use   3. Insomnia, unspecified type   4. Loss of appetite   5. Neoplasm related pain      Dr. Owens Shark, MD, MPH [facility] [phone] 09/26/2023 1:12 PM

## 2023-09-26 NOTE — Progress Notes (Signed)
 Patient here for infusion of zometa. PIV was started and orders were released. Patient had recent change of insurance as of the beginning of March and insurance authorization for zometa was pending. Pt was educated that insurance authorization was pending and could take 48 hours to be complete. Pt verified understanding that she would be unable to get infusion today. Pt educated if she did not receive a call from [place], to call our line in a few days to schedule an appointment for her zometa once authorization was complete.

## 2023-09-27 ENCOUNTER — Other Ambulatory Visit: Payer: Self-pay

## 2023-09-27 ENCOUNTER — Other Ambulatory Visit (HOSPITAL_COMMUNITY): Payer: Self-pay

## 2023-09-27 LAB — CANCER ANTIGEN 27.29: CA 27.29: 191.8 U/mL — ABNORMAL HIGH (ref 0.0–38.6)

## 2023-09-27 LAB — CANCER ANTIGEN 15-3: CA 15-3: 189 U/mL — ABNORMAL HIGH (ref 0.0–25.0)

## 2023-09-28 ENCOUNTER — Encounter: Payer: Self-pay | Admitting: Oncology

## 2023-09-28 ENCOUNTER — Other Ambulatory Visit: Payer: Self-pay

## 2023-09-29 ENCOUNTER — Inpatient Hospital Stay

## 2023-09-29 VITALS — BP 129/64 | HR 66 | Temp 98.0°F | Resp 18

## 2023-09-29 DIAGNOSIS — C7951 Secondary malignant neoplasm of bone: Secondary | ICD-10-CM | POA: Diagnosis not present

## 2023-09-29 DIAGNOSIS — C50919 Malignant neoplasm of unspecified site of unspecified female breast: Secondary | ICD-10-CM

## 2023-09-29 MED ORDER — ZOLEDRONIC ACID 4 MG/100ML IV SOLN
4.0000 mg | INTRAVENOUS | Status: DC
  Administered 2023-09-29: 4 mg via INTRAVENOUS
  Filled 2023-09-29: qty 100

## 2023-09-29 MED ORDER — SODIUM CHLORIDE 0.9 % IV SOLN
INTRAVENOUS | Status: DC
  Filled 2023-09-29: qty 250

## 2023-09-30 ENCOUNTER — Other Ambulatory Visit: Payer: Self-pay | Admitting: Oncology

## 2023-09-30 DIAGNOSIS — C50911 Malignant neoplasm of unspecified site of right female breast: Secondary | ICD-10-CM

## 2023-10-10 ENCOUNTER — Other Ambulatory Visit: Payer: Self-pay | Admitting: Oncology

## 2023-10-10 ENCOUNTER — Other Ambulatory Visit: Payer: Self-pay

## 2023-10-10 MED ORDER — OXYCODONE-ACETAMINOPHEN 5-325 MG PO TABS
1.0000 | ORAL_TABLET | Freq: Four times a day (QID) | ORAL | 0 refills | Status: DC | PRN
  Filled 2023-10-10: qty 30, 8d supply, fill #0

## 2023-10-10 MED ORDER — MORPHINE SULFATE ER 15 MG PO TBCR
15.0000 mg | EXTENDED_RELEASE_TABLET | Freq: Two times a day (BID) | ORAL | 0 refills | Status: DC
  Filled 2023-10-10: qty 60, 30d supply, fill #0

## 2023-10-12 ENCOUNTER — Other Ambulatory Visit: Payer: Self-pay

## 2023-10-17 ENCOUNTER — Other Ambulatory Visit: Payer: Self-pay | Admitting: *Deleted

## 2023-10-17 DIAGNOSIS — C7951 Secondary malignant neoplasm of bone: Secondary | ICD-10-CM

## 2023-10-17 NOTE — Telephone Encounter (Signed)
 The patient called and said that she was doing good to sleep with the medicine magnesium citrate. But she is having pain at right iliac  to toes,  the percocet is not helping as has been, she took a advil with perccet and it helped better. She is not suppose to take tylenol or  advil because of the liver. She wants to see what is best to help pain.

## 2023-10-17 NOTE — Telephone Encounter (Signed)
 Per Dr. Smith Robert "We can switch her from percocet 5/325 to oxycodone 10 mg and she can take tylenol separately. Would she be ok with that?".  Outbound call to patient; called [phone], unidentified caregiver answered call stating patient is not present and requested we call her cell phone number.  Outbound call to [phone]; spoke to patient; patient is in agreement to switch to Oxycodone 10mg  and taking Tylenol separately. Patient requested script to be sent to the community pharmacy.

## 2023-10-17 NOTE — Telephone Encounter (Signed)
 We can switch her from percocet 5/325 to oxycodone 10 mg and she can take tylenol separately. Would she be ok with that?

## 2023-10-18 ENCOUNTER — Other Ambulatory Visit: Payer: Self-pay

## 2023-10-18 ENCOUNTER — Encounter: Payer: Self-pay | Admitting: Oncology

## 2023-10-18 ENCOUNTER — Other Ambulatory Visit: Payer: Self-pay | Admitting: Oncology

## 2023-10-18 ENCOUNTER — Telehealth: Payer: Self-pay

## 2023-10-18 DIAGNOSIS — C7951 Secondary malignant neoplasm of bone: Secondary | ICD-10-CM

## 2023-10-18 MED ORDER — OXYCODONE HCL 10 MG PO TABS: 10.0000 mg | ORAL_TABLET | Freq: Four times a day (QID) | ORAL | 0 refills | Status: DC | PRN

## 2023-10-18 MED ORDER — OXYCODONE HCL 10 MG PO TABS
10.0000 mg | ORAL_TABLET | Freq: Four times a day (QID) | ORAL | 0 refills | Status: DC | PRN
Start: 2023-10-18 — End: 2023-11-23

## 2023-10-18 NOTE — Telephone Encounter (Signed)
 Per Dr. Smith Robert, Oxycodone script has been sent to the [facility] in [place]. Outbound call to patient; informed of above.  Patient is able to pick up medication from [facility].  No additional questions / concerns at this time.

## 2023-10-21 ENCOUNTER — Encounter: Payer: Self-pay | Admitting: Oncology

## 2023-10-21 ENCOUNTER — Telehealth: Payer: Self-pay

## 2023-10-21 NOTE — Telephone Encounter (Signed)
Completed paperwork faxed 

## 2023-10-21 NOTE — Telephone Encounter (Signed)
 Disability claim paperwork received.

## 2023-10-21 NOTE — Telephone Encounter (Signed)
 Paperwork completed pending Dr. Kaedan Richert Robert signature.

## 2023-11-07 ENCOUNTER — Inpatient Hospital Stay: Attending: Oncology

## 2023-11-07 DIAGNOSIS — Z79899 Other long term (current) drug therapy: Secondary | ICD-10-CM | POA: Insufficient documentation

## 2023-11-07 DIAGNOSIS — C7989 Secondary malignant neoplasm of other specified sites: Secondary | ICD-10-CM | POA: Insufficient documentation

## 2023-11-07 DIAGNOSIS — C7951 Secondary malignant neoplasm of bone: Secondary | ICD-10-CM | POA: Insufficient documentation

## 2023-11-07 DIAGNOSIS — Z170 Estrogen receptor positive status [ER+]: Secondary | ICD-10-CM | POA: Insufficient documentation

## 2023-11-07 DIAGNOSIS — C50919 Malignant neoplasm of unspecified site of unspecified female breast: Secondary | ICD-10-CM | POA: Insufficient documentation

## 2023-11-07 DIAGNOSIS — Z79811 Long term (current) use of aromatase inhibitors: Secondary | ICD-10-CM | POA: Insufficient documentation

## 2023-11-07 LAB — CBC WITH DIFFERENTIAL (CANCER CENTER ONLY)
Abs Immature Granulocytes: 0.04 10*3/uL (ref 0.00–0.07)
Basophils Absolute: 0 10*3/uL (ref 0.0–0.1)
Basophils Relative: 1 %
Eosinophils Absolute: 0.1 10*3/uL (ref 0.0–0.5)
Eosinophils Relative: 1 %
HCT: 35.8 % — ABNORMAL LOW (ref 36.0–46.0)
Hemoglobin: 11.1 g/dL — ABNORMAL LOW (ref 12.0–15.0)
Immature Granulocytes: 1 %
Lymphocytes Relative: 20 %
Lymphs Abs: 1.3 10*3/uL (ref 0.7–4.0)
MCH: 25.5 pg — ABNORMAL LOW (ref 26.0–34.0)
MCHC: 31 g/dL (ref 30.0–36.0)
MCV: 82.3 fL (ref 80.0–100.0)
Monocytes Absolute: 0.6 10*3/uL (ref 0.1–1.0)
Monocytes Relative: 9 %
Neutro Abs: 4.4 10*3/uL (ref 1.7–7.7)
Neutrophils Relative %: 68 %
Platelet Count: 333 10*3/uL (ref 150–400)
RBC: 4.35 MIL/uL (ref 3.87–5.11)
RDW: 17 % — ABNORMAL HIGH (ref 11.5–15.5)
WBC Count: 6.4 10*3/uL (ref 4.0–10.5)
nRBC: 0 % (ref 0.0–0.2)

## 2023-11-07 LAB — TSH: TSH: 2.385 u[IU]/mL (ref 0.350–4.500)

## 2023-11-07 LAB — CMP (CANCER CENTER ONLY)
ALT: 26 U/L (ref 0–44)
AST: 25 U/L (ref 15–41)
Albumin: 3.3 g/dL — ABNORMAL LOW (ref 3.5–5.0)
Alkaline Phosphatase: 102 U/L (ref 38–126)
Anion gap: 12 (ref 5–15)
BUN: 15 mg/dL (ref 6–20)
CO2: 21 mmol/L — ABNORMAL LOW (ref 22–32)
Calcium: 9 mg/dL (ref 8.9–10.3)
Chloride: 105 mmol/L (ref 98–111)
Creatinine: 1.05 mg/dL — ABNORMAL HIGH (ref 0.44–1.00)
GFR, Estimated: >60 mL/min (ref >60)
Glucose, Bld: 124 mg/dL — ABNORMAL HIGH (ref 70–99)
Potassium: 4 mmol/L (ref 3.5–5.1)
Sodium: 138 mmol/L (ref 135–145)
Total Bilirubin: 0.3 mg/dL (ref 0.0–1.2)
Total Protein: 7 g/dL (ref 6.5–8.1)

## 2023-11-07 LAB — T4, FREE: Free T4: 0.63 ng/dL (ref 0.61–1.12)

## 2023-11-08 ENCOUNTER — Encounter: Payer: Self-pay | Admitting: Oncology

## 2023-11-08 LAB — CA 27.29 (SERIAL MONITOR): CA 27.29: 299.4 U/mL — ABNORMAL HIGH (ref 0.0–38.6)

## 2023-11-19 ENCOUNTER — Other Ambulatory Visit (HOSPITAL_COMMUNITY): Payer: Self-pay

## 2023-11-21 ENCOUNTER — Other Ambulatory Visit: Payer: Self-pay

## 2023-11-21 ENCOUNTER — Other Ambulatory Visit (HOSPITAL_COMMUNITY): Payer: Self-pay

## 2023-11-21 MED ORDER — BUPROPION HCL ER (XL) 150 MG PO TB24
150.0000 mg | ORAL_TABLET | Freq: Every day | ORAL | 3 refills | Status: AC
  Filled 2023-11-21: qty 90, 90d supply, fill #0
  Filled 2024-02-20: qty 90, 90d supply, fill #1
  Filled 2024-05-19: qty 90, 90d supply, fill #2
  Filled 2024-08-14: qty 90, 90d supply, fill #3

## 2023-11-23 ENCOUNTER — Other Ambulatory Visit: Payer: Self-pay | Admitting: Oncology

## 2023-11-23 DIAGNOSIS — C7951 Secondary malignant neoplasm of bone: Secondary | ICD-10-CM

## 2023-11-24 ENCOUNTER — Encounter: Payer: Self-pay | Admitting: Oncology

## 2023-11-24 ENCOUNTER — Encounter
Admission: RE | Admit: 2023-11-24 | Discharge: 2023-11-24 | Discharge status: Home / Self Care | Source: Ambulatory Visit | Attending: Oncology

## 2023-11-24 ENCOUNTER — Ambulatory Visit
Admission: RE | Admit: 2023-11-24 | Discharge: 2023-11-24 | Disposition: A | Discharge status: Home / Self Care | Source: Ambulatory Visit | Attending: Oncology | Admitting: Oncology

## 2023-11-24 DIAGNOSIS — C50919 Malignant neoplasm of unspecified site of unspecified female breast: Secondary | ICD-10-CM | POA: Diagnosis present

## 2023-11-24 MED ORDER — TECHNETIUM TC 99M MEDRONATE IV KIT
20.0000 | PACK | Freq: Once | INTRAVENOUS | Status: AC | PRN
  Administered 2023-11-24: 20 via INTRAVENOUS

## 2023-11-24 MED ORDER — IOHEXOL 300 MG/ML  SOLN
100.0000 mL | Freq: Once | INTRAMUSCULAR | Status: AC | PRN
  Administered 2023-11-24: 100 mL via INTRAVENOUS

## 2023-11-24 MED ORDER — OXYCODONE HCL 10 MG PO TABS: 10.0000 mg | ORAL_TABLET | Freq: Four times a day (QID) | ORAL | 0 refills | Status: DC | PRN

## 2023-11-25 ENCOUNTER — Ambulatory Visit

## 2023-11-25 ENCOUNTER — Ambulatory Visit: Admission: RE | Admit: 2023-11-25 | Source: Ambulatory Visit

## 2023-12-06 ENCOUNTER — Other Ambulatory Visit: Payer: Self-pay | Admitting: Oncology

## 2023-12-06 DIAGNOSIS — C50911 Malignant neoplasm of unspecified site of right female breast: Secondary | ICD-10-CM

## 2023-12-12 ENCOUNTER — Other Ambulatory Visit (HOSPITAL_COMMUNITY): Payer: Self-pay

## 2023-12-12 MED ORDER — GABAPENTIN 100 MG PO CAPS
ORAL_CAPSULE | ORAL | 1 refills | Status: DC
  Filled 2024-01-27: qty 270, 90d supply, fill #0
  Filled 2024-04-12 – 2024-04-18 (×2): qty 270, 90d supply, fill #1

## 2023-12-12 MED ORDER — METFORMIN HCL ER 500 MG PO TB24
500.0000 mg | ORAL_TABLET | Freq: Two times a day (BID) | ORAL | 1 refills | Status: AC
  Filled 2024-04-12: qty 180, 90d supply, fill #0

## 2023-12-13 ENCOUNTER — Other Ambulatory Visit (HOSPITAL_COMMUNITY): Payer: Self-pay

## 2023-12-14 ENCOUNTER — Other Ambulatory Visit

## 2023-12-19 ENCOUNTER — Other Ambulatory Visit: Payer: Self-pay

## 2023-12-19 ENCOUNTER — Telehealth: Payer: Self-pay | Admitting: *Deleted

## 2023-12-19 DIAGNOSIS — C7951 Secondary malignant neoplasm of bone: Secondary | ICD-10-CM

## 2023-12-19 MED ORDER — OXYCODONE HCL 10 MG PO TABS: 10.0000 mg | ORAL_TABLET | Freq: Four times a day (QID) | ORAL | 0 refills | Status: DC | PRN

## 2023-12-19 NOTE — Telephone Encounter (Signed)
 We can keep her on oxycodone  until I see her. Please send her 1 weeks worth of medicine

## 2023-12-19 NOTE — Telephone Encounter (Signed)
 Patient called saying that she took her last oxycodone  last night, she needs a refill but she said last time that she saw Dr. Randy Buttery she suggest that we would change the oxycodone  to another medicine.  He has an appointment June 13 but she does not want to be without some kind of medicine during today up to June 13.

## 2023-12-23 ENCOUNTER — Encounter: Payer: Self-pay | Admitting: Oncology

## 2023-12-23 ENCOUNTER — Inpatient Hospital Stay (HOSPITAL_BASED_OUTPATIENT_CLINIC_OR_DEPARTMENT_OTHER): Admitting: Oncology

## 2023-12-23 ENCOUNTER — Other Ambulatory Visit: Payer: Self-pay

## 2023-12-23 ENCOUNTER — Inpatient Hospital Stay: Attending: Oncology

## 2023-12-23 ENCOUNTER — Inpatient Hospital Stay

## 2023-12-23 ENCOUNTER — Other Ambulatory Visit (HOSPITAL_COMMUNITY): Payer: Self-pay

## 2023-12-23 VITALS — BP 111/74 | HR 80 | Temp 96.8°F | Resp 15 | Wt 231.0 lb

## 2023-12-23 DIAGNOSIS — Z7983 Long term (current) use of bisphosphonates: Secondary | ICD-10-CM | POA: Diagnosis not present

## 2023-12-23 DIAGNOSIS — G893 Neoplasm related pain (acute) (chronic): Secondary | ICD-10-CM

## 2023-12-23 DIAGNOSIS — C7951 Secondary malignant neoplasm of bone: Secondary | ICD-10-CM | POA: Insufficient documentation

## 2023-12-23 DIAGNOSIS — Z853 Personal history of malignant neoplasm of breast: Secondary | ICD-10-CM | POA: Insufficient documentation

## 2023-12-23 DIAGNOSIS — C50919 Malignant neoplasm of unspecified site of unspecified female breast: Secondary | ICD-10-CM

## 2023-12-23 DIAGNOSIS — C7989 Secondary malignant neoplasm of other specified sites: Secondary | ICD-10-CM | POA: Insufficient documentation

## 2023-12-23 DIAGNOSIS — Z170 Estrogen receptor positive status [ER+]: Secondary | ICD-10-CM | POA: Diagnosis not present

## 2023-12-23 DIAGNOSIS — Z5111 Encounter for antineoplastic chemotherapy: Secondary | ICD-10-CM | POA: Insufficient documentation

## 2023-12-23 DIAGNOSIS — Z79899 Other long term (current) drug therapy: Secondary | ICD-10-CM

## 2023-12-23 DIAGNOSIS — Z5181 Encounter for therapeutic drug level monitoring: Secondary | ICD-10-CM

## 2023-12-23 LAB — CBC WITH DIFFERENTIAL (CANCER CENTER ONLY)
Abs Immature Granulocytes: 0.03 10*3/uL (ref 0.00–0.07)
Basophils Absolute: 0 10*3/uL (ref 0.0–0.1)
Basophils Relative: 0 %
Eosinophils Absolute: 0.1 10*3/uL (ref 0.0–0.5)
Eosinophils Relative: 1 %
HCT: 34.3 % — ABNORMAL LOW (ref 36.0–46.0)
Hemoglobin: 10.5 g/dL — ABNORMAL LOW (ref 12.0–15.0)
Immature Granulocytes: 0 %
Lymphocytes Relative: 15 %
Lymphs Abs: 1.1 10*3/uL (ref 0.7–4.0)
MCH: 25.1 pg — ABNORMAL LOW (ref 26.0–34.0)
MCHC: 30.6 g/dL (ref 30.0–36.0)
MCV: 82.1 fL (ref 80.0–100.0)
Monocytes Absolute: 0.7 10*3/uL (ref 0.1–1.0)
Monocytes Relative: 9 %
Neutro Abs: 5.2 10*3/uL (ref 1.7–7.7)
Neutrophils Relative %: 75 %
Platelet Count: 232 10*3/uL (ref 150–400)
RBC: 4.18 MIL/uL (ref 3.87–5.11)
RDW: 18.2 % — ABNORMAL HIGH (ref 11.5–15.5)
WBC Count: 7.1 10*3/uL (ref 4.0–10.5)
nRBC: 0 % (ref 0.0–0.2)

## 2023-12-23 LAB — CMP (CANCER CENTER ONLY)
ALT: 33 U/L (ref 0–44)
AST: 39 U/L (ref 15–41)
Albumin: 3.4 g/dL — ABNORMAL LOW (ref 3.5–5.0)
Alkaline Phosphatase: 110 U/L (ref 38–126)
Anion gap: 11 (ref 5–15)
BUN: 18 mg/dL (ref 6–20)
CO2: 23 mmol/L (ref 22–32)
Calcium: 8.6 mg/dL — ABNORMAL LOW (ref 8.9–10.3)
Chloride: 105 mmol/L (ref 98–111)
Creatinine: 1.28 mg/dL — ABNORMAL HIGH (ref 0.44–1.00)
GFR, Estimated: 48 mL/min — ABNORMAL LOW (ref >60)
Glucose, Bld: 124 mg/dL — ABNORMAL HIGH (ref 70–99)
Potassium: 4.9 mmol/L (ref 3.5–5.1)
Sodium: 139 mmol/L (ref 135–145)
Total Bilirubin: 0.4 mg/dL (ref 0.0–1.2)
Total Protein: 7.1 g/dL (ref 6.5–8.1)

## 2023-12-23 MED ORDER — OXYCODONE HCL 10 MG PO TABS
10.0000 mg | ORAL_TABLET | Freq: Four times a day (QID) | ORAL | 0 refills | Status: DC | PRN
  Filled 2023-12-23: qty 28, 7d supply, fill #0

## 2023-12-23 MED ORDER — ZOLEDRONIC ACID 4 MG/5ML IV CONC
3.0000 mg | INTRAVENOUS | Status: DC
  Administered 2023-12-23: 3 mg via INTRAVENOUS
  Filled 2023-12-23: qty 3.75

## 2023-12-23 MED ORDER — HYDROMORPHONE HCL 2 MG PO TABS
1.0000 mg | ORAL_TABLET | Freq: Four times a day (QID) | ORAL | 0 refills | Status: DC | PRN
  Filled 2023-12-23: qty 120, 60d supply, fill #0

## 2023-12-23 MED ORDER — SODIUM CHLORIDE 0.9 % IV SOLN
INTRAVENOUS | Status: DC
  Filled 2023-12-23: qty 250

## 2023-12-23 NOTE — Progress Notes (Unsigned)
 Dilaudid script sent to [facility].  Called over to pharmacy and informed pt is in infusion and would need to pick up med upon leaving; patient will be out of town this weekend.  Confirmed medication will be ready for pickup.

## 2023-12-23 NOTE — Patient Instructions (Signed)

## 2023-12-24 ENCOUNTER — Other Ambulatory Visit: Payer: Self-pay

## 2023-12-24 ENCOUNTER — Encounter: Payer: Self-pay | Admitting: Oncology

## 2023-12-24 NOTE — Progress Notes (Addendum)
 Hematology/Oncology Consult note [facility]  Telephone:([phone] [phone]  Patient Care Team: Nestor Banter, MD as PCP - General (Family Medicine) Boyce Byes, MD as PCP - Electrophysiology (Cardiology) Avonne Boettcher, MD as Consulting Physician (Oncology)   Name of the patient: Jacqueline Deleon  [id number]  11/17/1960   Date of visit: 12/24/23  Diagnosis-  stage IV ER positive breast cancer with bone metastases   Chief complaint/ Reason for visit-discuss CT scan results and further management.    Heme/Onc history: Patient is a 63 year old female with a past medical history significant for stage I right breast cancer diagnosed in 2009.  It was a 1.4 cm tumor with negative lymph nodes grade 1 and patient went on to get lumpectomy followed by adjuvant radiation therapy.  Oncotype DX score was 6 and she did not require adjuvant chemotherapy.  She was on tamoxifen for 5 years.  She had BRCA testing done at that time which was negative.   Patient felt a lump in her right axilla in September 2021.  She underwent ultrasound of bilateral breasts which did not pick up any axillary mass.  A prior screening mammogram in September 2021 was unremarkable.  She was then admitted for Covid pneumonia and underwent CT angio chest on 07/22/2020 which incidentally picked up an axillary mass measuring 2.5 cm.  No obvious breast mass.  Axillary mass was biopsied and was consistent with high-grade invasive mammary carcinoma.  IHC was positive for GATA3 with diffuse strong nuclear staining.  There was no lymph node architecture identified in the sample and it could not be determined if it is a primary tumor within the axillary breast tissue or metastases.  ER strongly positive greater than 90%, PR 1 to 10% positive and HER-2 negative   PET CT scan showed hypermetabolic poorly marginated solid right axillary mass 2.5 x 2 cm with an SUV of 5.6.  No enlarged hypermetabolic  mediastinal or hilar lymph nodes no enlarged left axillary lymph nodes.  Subcentimeter lung nodules below PET resolution.  Small hypermetabolism in the right liver with an SUV of 6 without CT correlate equivocal for liver metastases.  Multiple hypermetabolic faintly lytic osseous lesions throughout the lumbar spine and bilateral pelvic girdle with representative supra-acetabular right iliac bone 1.9 cm lesion, anterior superior left acetabular 1.7 cm lesion and L1 1.2 cm lesion.   NGS testing showed no actionable mutations.  PD-L1 less than 1%.  CHEK2 S422 FS, PIK3CA N1068 FS, PIK3CA N345H, CCN E1 gain, ERB B2 1 P-CSF 1R fusion, FGF 19 gain, FGF 3 gain, FGF 4gain.   Ibrance  plus letrozole  started in February 2022.    Patient noted to have increase in CA 27-29 from 26 and November 2023 203 in July 2024.  CT chest abdomen pelvis with contrast and bone scan showed worsening metastatic disease in the bones especially in the thoracolumbar spine and right hemipelvis.    Peripheral blood NGS testing showed CHEK2 mutation, PIK3CA P.N1068 FS, PIK3CA P.N345H, PTEN P.R173S, ESR 1P.D538G, ESR 1P.L536R, PIK3CA P.R88Q.  MSI high not detected  Elacestrant  started in sept 2024.  She has not been on fulvestrant so far    Interval history- appetite and weight have remained stable. She does report increasing pain in her hips  ECOG PS- 1 Pain scale- 4 Opioid associated constipation- no  Review of systems- Review of Systems  Constitutional:  Negative for chills, fever, malaise/fatigue and weight loss.  HENT:  Negative for congestion, ear discharge  and nosebleeds.   Eyes:  Negative for blurred vision.  Respiratory:  Negative for cough, hemoptysis, sputum production, shortness of breath and wheezing.   Cardiovascular:  Negative for chest pain, palpitations, orthopnea and claudication.  Gastrointestinal:  Negative for abdominal pain, blood in stool, constipation, diarrhea, heartburn, melena, nausea and vomiting.   Genitourinary:  Negative for dysuria, flank pain, frequency, hematuria and urgency.  Musculoskeletal:  Positive for joint pain. Negative for back pain and myalgias.  Skin:  Negative for rash.  Neurological:  Negative for dizziness, tingling, focal weakness, seizures, weakness and headaches.  Endo/Heme/Allergies:  Does not bruise/bleed easily.  Psychiatric/Behavioral:  Negative for depression and suicidal ideas. The patient does not have insomnia.       Allergies  Allergen Reactions   Kiwi Extract Itching and Swelling    The real kiwi fruit   Codeine Other (See Comments)    Felt funny.   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   Chicken pox    Depression    Fatty liver    Fatty liver    Gastric polyps    GERD (gastroesophageal reflux disease)    Gestational diabetes    prediabetes out side of having baby   Headache    migraines   Heart murmur    History of abnormal mammogram 08/2006, 01/2008, 03/2009   Hypertension    IBS (irritable bowel syndrome)    Joint disorder    hx of left ankle tendonitis, bursitis, heel spur   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 Smith/ laprascopic surgery   COLONOSCOPY  04/04/2000   COLONOSCOPY WITH PROPOFOL  N/A 02/12/2019   Procedure: COLONOSCOPY WITH PROPOFOL ;  Surgeon: Deveron Fly, MD;  Location: [facility] ENDOSCOPY;  Service: Endoscopy;  Laterality: N/A;   ESOPHAGOGASTRODUODENOSCOPY (EGD) WITH PROPOFOL  N/A 05/01/2015   Procedure: ESOPHAGOGASTRODUODENOSCOPY (EGD) WITH PROPOFOL ;  Surgeon: Stephens Eis, MD;  Location: [facility] ENDOSCOPY;  Service: Gastroenterology;  Laterality: N/A;   ESOPHAGOGASTRODUODENOSCOPY (EGD) WITH PROPOFOL  N/A 02/12/2019   Procedure: ESOPHAGOGASTRODUODENOSCOPY (EGD) WITH PROPOFOL ;  Surgeon: Deveron Fly, MD;  Location: [facility] ENDOSCOPY;  Service: Endoscopy;  Laterality: N/A;   EYE SURGERY  08/2012   laser surgery on retina/ DR Appenzeller   FINGER SURGERY     repair of tendon in right index, Dr. Everardo Hitch in [facility]   FOOT SURGERY     Dr. Lara Plants   IRRIGATION AND DEBRIDEMENT SEBACEOUS CYST     right upper back   LAPAROSCOPIC SUPRACERVICAL HYSTERECTOMY  06/2007   Dr Kincius/ adhesions/CPP/adenomyosis   MASTECTOMY PARTIAL / LUMPECTOMY Right 01/2008   with sentinal lymph node biopsy/ infiltrating ductal cancer   REPAIR PERONEAL TENDONS ANKLE  10/2019   with tarsal exostectomy and sural neuroma excision on right    SKIN CANCER EXCISION     back and calves   WISDOM TOOTH EXTRACTION  1991    Social History   Socioeconomic History   Marital status: Married    Spouse name: Siegfried Dress   Number of children: 1   Years of education: 14   Highest education level: Not on file  Occupational History   Occupation: CMA  Tobacco Use   Smoking status: Never    Smokeless tobacco: Never  Vaping Use   Vaping status: Never Used  Substance and Sexual Activity   Alcohol use: Yes    Alcohol/week: 0.0 standard drinks of alcohol    Comment: rare, 1-2 drinks per year   Drug use: No   Sexual activity: Yes    Partners: Male    Birth control/protection: Surgical    Comment: Hysterectomy   Other Topics Concern   Not on file  Social History Narrative   Lives in [place] with husband, no pets.  Works at [employer].      Diet - regular   Exercise - occasional   Social Drivers of Health   Financial Resource Strain: Low Risk  (11/23/2022)   Received from [facility]   Overall Financial Resource Strain (CARDIA)    Difficulty of Paying Living Expenses: Not hard at all  Food Insecurity: No Food Insecurity (11/23/2022)   Received from [facility]   Hunger Vital Sign    Within the past 12 months, you worried that your food would run out before you got the money to buy more.: Never true    Within the past 12 months, the food you bought just didn't last and you didn't have money to get more.: Never true  Transportation Needs: No Transportation Needs (11/23/2022)   Received from [facility] - Transportation    In the past 12 months, has lack of transportation kept you from medical appointments or from getting medications?: No    Lack of Transportation (Non-Medical): No  Physical Activity: Insufficiently Active (07/27/2017)   Exercise Vital Sign    Days of Exercise per Week: 4 days    Minutes of Exercise per Session: 30 min  Stress: No Stress Concern Present (07/27/2017)   [employer] of Occupational Health - Occupational Stress Questionnaire    Feeling of Stress : Only a little  Social Connections: Socially Integrated (07/27/2017)   Social Connection and Isolation Panel    Frequency of Communication with Friends and Family: More than three times a week    Frequency of Social Gatherings with  Friends and Family: Once a week    Attends Religious Services: More than 4 times per year    Active Member of [employer] or Organizations: Yes    Attends Banker Meetings: More than 4 times per year  Marital Status: Married  Catering manager Violence: Not At Risk (07/27/2017)   Humiliation, Afraid, Rape, and Kick questionnaire    Fear of Current or Ex-Partner: No    Emotionally Abused: No    Physically Abused: No    Sexually Abused: No    Family History  Problem Relation Age of Onset   Hypertension Mother    Thyroid  disease Mother    Breast cancer Mother 87   Colon cancer Mother 27       again at 73   Atrial fibrillation Mother    Hip fracture Mother    Skin cancer Mother    Stroke Father    Hypertension Father    Cancer Father 26       prostate   Parkinson's disease Father    Skin cancer Father    Atrial fibrillation Sister        Pacemaker inserted end of Sept-beginning of Oct. 2024   Hypertension Sister    Thyroid  disease Sister    Cancer Sister        skin/ squamous cell   Heart Problems Sister    Diabetes Maternal Aunt    Cancer Maternal Aunt    Breast cancer Maternal Aunt 41   Lymphoma Maternal Uncle    Heart disease Maternal Uncle    Melanoma Maternal Uncle 26   Lung cancer Paternal Aunt 59       smoker   Diabetes Maternal Grandmother    Stroke Maternal Grandfather    Heart disease Paternal Grandfather    Thyroid  cancer Cousin        maternal   Breast cancer Cousin 26       maternal   Breast cancer Cousin 41   Colon cancer Cousin      Current Outpatient Medications:    aspirin EC 81 MG tablet, Take by mouth., Disp: , Rfl:    b complex vitamins capsule, Take 1 capsule by mouth daily., Disp: , Rfl:    buPROPion  (WELLBUTRIN  XL) 150 MG 24 hr tablet, Take 1 tablet (150 mg total) by mouth daily. Take along a 300mg  tablet for a total daily dose of 450mg ., Disp: 90 tablet, Rfl: 3   buPROPion  (WELLBUTRIN  XL) 300 MG 24 hr tablet, Take 1 tablet (300 mg  total) by mouth daily., Disp: 90 tablet, Rfl: 3   busPIRone  (BUSPAR ) 10 MG tablet, Take 1 tablet (10 mg total) by mouth 2 (two) times daily, Disp: 180 tablet, Rfl: 1   calcium carbonate (OSCAL) 1500 (600 Ca) MG TABS tablet, Take by mouth 2 (two) times daily with a meal., Disp: , Rfl:    Cholecalciferol (VITAMIN D3) 10 MCG (400 UNIT) CAPS, Take by mouth., Disp: , Rfl:    citalopram  (CELEXA ) 40 MG tablet, Take 1 tablet (40 mg total) by mouth daily., Disp: 90 tablet, Rfl: 3   diphenhydrAMINE  (BENADRYL ) 25 MG tablet, Take 25 mg by mouth at bedtime as needed for sleep. Pt states daily now for sleep and allergies., Disp: , Rfl:    gabapentin  (NEURONTIN ) 100 MG capsule, Take 1 capsule (100 mg total) by mouth every morning AND 2 capsules (200 mg total) every evening., Disp: 270 capsule, Rfl: 1   ketotifen  (ZADITOR ) 0.025 % ophthalmic solution, Place 1 drop into both eyes daily., Disp: , Rfl:    lisinopril  (ZESTRIL ) 10 MG tablet, Take 1 tablet (10 mg total) by mouth daily., Disp: 90 tablet, Rfl: 1   loperamide  (IMODIUM ) 2 MG capsule, Take 2 tabs by mouth with  first loose stool, then 1 tab with each additional loose stool as needed. Do not exceed 8 tabs in a 24-hour period, Disp: 30 capsule, Rfl: 0   metFORMIN  (GLUCOPHAGE -XR) 500 MG 24 hr tablet, Take 1 tablet (500 mg total) by mouth 2 (two) times daily with a meal., Disp: 180 tablet, Rfl: 1   morphine  (MS CONTIN ) 15 MG 12 hr tablet, Take 1 tablet (15 mg total) by mouth every 12 (twelve) hours., Disp: 60 tablet, Rfl: 0   Multiple Vitamin (MULTIVITAMIN) tablet, Take 1 tablet by mouth daily., Disp: , Rfl:    omeprazole  (PRILOSEC) 20 MG capsule, Take 1 capsule (20 mg total) by mouth daily., Disp: 90 capsule, Rfl: 3   ondansetron  (ZOFRAN ) 4 MG tablet, Take 1 tablet (4 mg total) by mouth every 8 (eight) hours as needed for nausea or vomiting., Disp: 90 tablet, Rfl: 0   ondansetron  (ZOFRAN ) 8 MG tablet, Take 1 tablet (8 mg total) by mouth every 8 (eight) hours as  needed for nausea or vomiting., Disp: 30 tablet, Rfl: 1   ORSERDU  86 MG tablet, TAKE 3 TABLETS BY MOUTH ONCE DAILY, Disp: 90 tablet, Rfl: 0   prochlorperazine  (COMPAZINE ) 10 MG tablet, Take 1 tablet (10 mg total) by mouth every 6 (six) hours as needed for nausea or vomiting., Disp: 30 tablet, Rfl: 1   HYDROmorphone (DILAUDID) 2 MG tablet, Take 0.5 tablets (1 mg total) by mouth every 6 (six) hours as needed for severe pain (pain score 7-10)., Disp: 120 tablet, Rfl: 0  Physical exam:  Vitals:   12/23/23 0943  BP: 111/74  Pulse: 80  Resp: 15  Temp: (!) 96.8 F (36 C)  TempSrc: Tympanic  SpO2: 98%  Weight: 231 lb (104.8 kg)   Physical Exam  Cardiovascular:     Rate and Rhythm: Normal rate and regular rhythm.     Heart sounds: Normal heart sounds.  Pulmonary:     Effort: Pulmonary effort is normal.     Breath sounds: Normal breath sounds.  Abdominal:     General: Bowel sounds are normal.   Skin:    General: Skin is warm and dry.   Neurological:     Mental Status: She is alert and oriented to person, place, and time.      I have personally reviewed labs listed below:    Latest Ref Rng & Units 12/23/2023    9:28 AM  CMP  Glucose 70 - 99 mg/dL 409   BUN 6 - 20 mg/dL 18   Creatinine 8.11 - 1.00 mg/dL 9.14   Sodium 782 - 956 mmol/L 139   Potassium 3.5 - 5.1 mmol/L 4.9   Chloride 98 - 111 mmol/L 105   CO2 22 - 32 mmol/L 23   Calcium 8.9 - 10.3 mg/dL 8.6   Total Protein 6.5 - 8.1 g/dL 7.1   Total Bilirubin 0.0 - 1.2 mg/dL 0.4   Alkaline Phos 38 - 126 U/L 110   AST 15 - 41 U/L 39   ALT 0 - 44 U/L 33       Latest Ref Rng & Units 12/23/2023    9:28 AM  CBC  WBC 4.0 - 10.5 K/uL 7.1   Hemoglobin 12.0 - 15.0 g/dL 21.3   Hematocrit 08.6 - 46.0 % 34.3   Platelets 150 - 400 K/uL 232      Assessment and plan- Patient is a 63 y.o. female with metastatic ER+ breast cancer with ESR1 and PIK3Ca mutations currently on elacestrant  here for  routine f/u  I have reviewed CT chest  abdomen And pelvis images as well as bone scan images independently.  CT scans do not show any evidence of new or progressive metastases.  Bone mets overall appears stable on CT and bone scan as well these appear mostly stable although there are areas of increasing activity noted in PT and T12 as well as ribs.  There is also increasing activity noted in the right acetabulum and right proximal femur where patient's pain is mainly located.  I will discuss her case at tumor board next week and I am also awaiting her CA 27-29 to get resulted.  She has had mild but steady increase in her CA 27-29 values from 5 months ago when it was 145 and a month ago it was 299.  If there is further elevation in these values I will plan to switch her to capivasertib plus fulvestrant.  Neoplasm related pain: I will stop her as needed oxycodone  and switch her to as needed Dilaudid 1 mg every 6 hours as needed.  Bone metastases: She is on Zometa  every 3 months and will receive her next dose today  Labs in 1 and 2 months and I will see her back in 2 months   Visit Diagnosis 1. High risk medication use   2. Pain from bone metastases (HCC)   3. Primary malignant neoplasm of breast with metastasis (HCC)   4. Neoplasm related pain   5. Encounter for monitoring zoledronic  acid therapy      Dr. Seretha Dance, MD, MPH [facility] [phone] 12/24/2023 4:13 PM

## 2023-12-26 LAB — CA 27.29 (SERIAL MONITOR): CA 27.29: 428.1 U/mL — ABNORMAL HIGH (ref 0.0–38.6)

## 2023-12-27 ENCOUNTER — Encounter: Payer: Self-pay | Admitting: Oncology

## 2023-12-27 NOTE — Telephone Encounter (Signed)
 I see her on Thursday for video visit

## 2023-12-29 ENCOUNTER — Telehealth: Payer: Self-pay | Admitting: Pharmacist

## 2023-12-29 ENCOUNTER — Other Ambulatory Visit (HOSPITAL_COMMUNITY): Payer: Self-pay

## 2023-12-29 ENCOUNTER — Telehealth: Payer: Self-pay | Admitting: Pharmacy Technician

## 2023-12-29 ENCOUNTER — Inpatient Hospital Stay: Admitting: Oncology

## 2023-12-29 ENCOUNTER — Other Ambulatory Visit

## 2023-12-29 ENCOUNTER — Encounter: Payer: Self-pay | Admitting: Oncology

## 2023-12-29 ENCOUNTER — Other Ambulatory Visit: Payer: Self-pay

## 2023-12-29 DIAGNOSIS — C7951 Secondary malignant neoplasm of bone: Secondary | ICD-10-CM

## 2023-12-29 DIAGNOSIS — C50919 Malignant neoplasm of unspecified site of unspecified female breast: Secondary | ICD-10-CM

## 2023-12-29 DIAGNOSIS — C50911 Malignant neoplasm of unspecified site of right female breast: Secondary | ICD-10-CM

## 2023-12-29 DIAGNOSIS — Z170 Estrogen receptor positive status [ER+]: Secondary | ICD-10-CM

## 2023-12-29 DIAGNOSIS — Z7189 Other specified counseling: Secondary | ICD-10-CM

## 2023-12-29 MED ORDER — ACCU-CHEK SOFTCLIX LANCETS MISC
1.0000 | Freq: Three times a day (TID) | 0 refills | Status: AC
  Filled 2023-12-29: qty 100, 30d supply, fill #0

## 2023-12-29 MED ORDER — BLOOD GLUCOSE TEST VI STRP
1.0000 | ORAL_STRIP | Freq: Three times a day (TID) | 0 refills | Status: DC
  Filled 2023-12-29: qty 100, 34d supply, fill #0

## 2023-12-29 MED ORDER — CAPIVASERTIB 200 MG PO TABS
400.0000 mg | ORAL_TABLET | Freq: Two times a day (BID) | ORAL | 0 refills | Status: DC
  Filled 2023-12-30: qty 64, 28d supply, fill #0

## 2023-12-29 MED ORDER — BLOOD GLUCOSE MONITOR SYSTEM W/DEVICE KIT
1.0000 | PACK | Freq: Three times a day (TID) | 0 refills | Status: AC
  Filled 2023-12-29: qty 1, 30d supply, fill #0

## 2023-12-29 MED ORDER — MORPHINE SULFATE ER 30 MG PO TBCR: 30.0000 mg | EXTENDED_RELEASE_TABLET | Freq: Two times a day (BID) | ORAL | 0 refills | Status: DC

## 2023-12-29 MED ORDER — LANCET DEVICE MISC
1.0000 | Freq: Three times a day (TID) | 0 refills | Status: DC
  Filled 2023-12-29 – 2024-01-04 (×5): qty 1, 30d supply, fill #0

## 2023-12-29 NOTE — Telephone Encounter (Signed)
 Clinical Pharmacist Practitioner Encounter   Received new prescription for Truqap (capivasertib) for the treatment of metastatic ER positive, HER2 negative breast cancer in conjunction with fulvestrant, planned duration until disease progression or unacceptable drug toxicity.  Patient has diabetes, last A1c in EPIC  of 6% (05/24/23). Patient will have repeat A1c checked prior to treatment initiation.   Lab monitoring recommendations: HbA1c: Before initiation, every three months during treatment, and as clinically indicated Fasting glucose: Prior to treatment, on day 3 or 4 of the dosing week during weeks 1, 2, 4, 6 and 8; then monthly while on treatment, and as clinically indicated  Prescription dose and frequency assessed.   Current medication list in Epic reviewed, no relevant DDIs with capivasertib identified.   Evaluated chart and no patient barriers to medication adherence identified.   Prescription has been e-scribed to the [facility] for benefits analysis and approval.  Oral Oncology Clinic will continue to follow for insurance authorization, copayment issues, initial counseling and start date.   Srihitha Tagliaferri N. Adit Riddles, PharmD, BCOP, CPP Hematology/Oncology Clinical Pharmacist [facility] Oral Chemotherapy [facility] [phone]  12/29/2023 2:40 PM

## 2023-12-29 NOTE — Telephone Encounter (Signed)
 Oral Oncology Patient Advocate Encounter   Received notification that prior authorization for Truqap  is required.   PA submitted on 12/29/2023 Key [id number] Status is pending     Kenia Teagarden ([name]) Benjaman Branch, CPhT  [facility], [place], [place], [place] Oral Chemotherapy Patient Advocate Phone: [phone]  Fax: [phone]

## 2023-12-29 NOTE — Progress Notes (Signed)
 Patient states she is having really bad right hip pain. She describes the pain as a burning and sharp. She says her pain level is 8 on a scale from 1-10. She also says she's worried about her blood results and concerned about next steps. She also states she thinks she would like to move forward with radiation. She also states her dalauid is not holding for 6 hours and that she needs a refill on morphine .

## 2023-12-30 ENCOUNTER — Other Ambulatory Visit: Payer: Self-pay

## 2023-12-30 ENCOUNTER — Other Ambulatory Visit: Payer: Self-pay | Admitting: Oncology

## 2023-12-30 ENCOUNTER — Other Ambulatory Visit: Payer: Self-pay | Admitting: Pharmacy Technician

## 2023-12-30 ENCOUNTER — Encounter: Payer: Self-pay | Admitting: Oncology

## 2023-12-30 ENCOUNTER — Telehealth: Payer: Self-pay | Admitting: Pharmacy Technician

## 2023-12-30 ENCOUNTER — Other Ambulatory Visit (HOSPITAL_COMMUNITY): Payer: Self-pay

## 2023-12-30 NOTE — Addendum Note (Signed)
 Addended by: Marilyn Shropshire on: 12/30/2023 01:34 PM   Modules accepted: Orders

## 2023-12-30 NOTE — Telephone Encounter (Signed)
 Patient successfully OnBoarded and drug education provided by pharmacist. Medication scheduled to be shipped on 06/23 for delivery on 06/24 from [facility] to patient's address. Patient also knows to call me at [phone] with any questions or concerns regarding receiving medication or if there is any unexpected change in co-pay.   Jacqueline Deleon ([name]) Benjaman Branch, CPhT  [facility], [place], [place], [place] Oral Chemotherapy Patient Advocate Phone: [phone]  Fax: [phone]

## 2023-12-30 NOTE — Progress Notes (Signed)
 I connected with Jacqueline Deleon on 12/30/23 at  1:30 PM EDT by video enabled telemedicine visit and verified that I am speaking with the correct person using two identifiers.   I discussed the limitations, risks, security and privacy concerns of performing an evaluation and management service by telemedicine and the availability of in-person appointments. I also discussed with the patient that there may be a patient responsible charge related to this service. The patient expressed understanding and agreed to proceed.  Other persons participating in the visit and their role in the encounter:  none  Patient's location:  home Provider's location:  home  Chief Complaint: Discuss further treatment options for metastatic breast cancer  History of present illness: Patient is a 63 year old female with a past medical history significant for stage I right breast cancer diagnosed in 2009.  It was a 1.4 cm tumor with negative lymph nodes grade 1 and patient went on to get lumpectomy followed by adjuvant radiation therapy.  Oncotype DX score was 6 and she did not require adjuvant chemotherapy.  She was on tamoxifen for 5 years.  She had BRCA testing done at that time which was negative.   Patient felt a lump in her right axilla in September 2021.  She underwent ultrasound of bilateral breasts which did not pick up any axillary mass.  A prior screening mammogram in September 2021 was unremarkable.  She was then admitted for Covid pneumonia and underwent CT angio chest on 07/22/2020 which incidentally picked up an axillary mass measuring 2.5 cm.  No obvious breast mass.  Axillary mass was biopsied and was consistent with high-grade invasive mammary carcinoma.  IHC was positive for GATA3 with diffuse strong nuclear staining.  There was no lymph node architecture identified in the sample and it could not be determined if it is a primary tumor within the axillary breast tissue or metastases.  ER strongly positive greater  than 90%, PR 1 to 10% positive and HER-2 negative   PET CT scan showed hypermetabolic poorly marginated solid right axillary mass 2.5 x 2 cm with an SUV of 5.6.  No enlarged hypermetabolic mediastinal or hilar lymph nodes no enlarged left axillary lymph nodes.  Subcentimeter lung nodules below PET resolution.  Small hypermetabolism in the right liver with an SUV of 6 without CT correlate equivocal for liver metastases.  Multiple hypermetabolic faintly lytic osseous lesions throughout the lumbar spine and bilateral pelvic girdle with representative supra-acetabular right iliac bone 1.9 cm lesion, anterior superior left acetabular 1.7 cm lesion and L1 1.2 cm lesion.   NGS testing showed no actionable mutations.  PD-L1 less than 1%.  CHEK2 S422 FS, PIK3CA N1068 FS, PIK3CA N345H, CCN E1 gain, ERB B2 1 P-CSF 1R fusion, FGF 19 gain, FGF 3 gain, FGF 4gain.   Ibrance  plus letrozole  started in February 2022.    Patient noted to have increase in CA 27-29 from 26 and November 2023 203 in July 2024.  CT chest abdomen pelvis with contrast and bone scan showed worsening metastatic disease in the bones especially in the thoracolumbar spine and right hemipelvis.    Peripheral blood NGS testing showed CHEK2 mutation, PIK3CA P.N1068 FS, PIK3CA P.N345H, PTEN P.R173S, ESR 1P.D538G, ESR 1P.L536R, PIK3CA P.R88Q.  MSI high not detected  Elacestrant  started in sept 2024.  She has not been on fulvestrant so far      Interval history : Patient was switched from as needed oxycodone  to as needed Dilaudid given her worsening low back pain and generalized  pelvic pain.  She is taking 1 mg of Dilaudid every 6 hours as needed along with long-acting morphine  but feels that the pain is not currently well-controlled.   Review of Systems  Constitutional:  Negative for chills, fever, malaise/fatigue and weight loss.  HENT:  Negative for congestion, ear discharge and nosebleeds.   Eyes:  Negative for blurred vision.  Respiratory:   Negative for cough, hemoptysis, sputum production, shortness of breath and wheezing.   Cardiovascular:  Negative for chest pain, palpitations, orthopnea and claudication.  Gastrointestinal:  Negative for abdominal pain, blood in stool, constipation, diarrhea, heartburn, melena, nausea and vomiting.  Genitourinary:  Negative for dysuria, flank pain, frequency, hematuria and urgency.  Musculoskeletal:  Positive for back pain and joint pain. Negative for myalgias.  Skin:  Negative for rash.  Neurological:  Negative for dizziness, tingling, focal weakness, seizures, weakness and headaches.  Endo/Heme/Allergies:  Does not bruise/bleed easily.  Psychiatric/Behavioral:  Negative for depression and suicidal ideas. The patient does not have insomnia.     Allergies  Allergen Reactions   Kiwi Extract Itching and Swelling    The real kiwi fruit   Codeine Other (See Comments)    Felt funny.   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   Chicken pox    Depression    Fatty liver    Fatty liver    Gastric polyps    GERD (gastroesophageal reflux disease)    Gestational diabetes    prediabetes out side of having baby   Headache    migraines   Heart murmur    History of abnormal mammogram 08/2006, 01/2008, 03/2009   Hypertension    IBS (irritable bowel syndrome)    Joint disorder    hx of left ankle tendonitis, bursitis, heel spur   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 Smith/ laprascopic surgery   COLONOSCOPY  04/04/2000   COLONOSCOPY WITH PROPOFOL  N/A 02/12/2019   Procedure: COLONOSCOPY WITH PROPOFOL ;  Surgeon: Deveron Fly, MD;  Location: [facility] ENDOSCOPY;  Service: Endoscopy;  Laterality: N/A;   ESOPHAGOGASTRODUODENOSCOPY (EGD) WITH PROPOFOL  N/A 05/01/2015  Procedure: ESOPHAGOGASTRODUODENOSCOPY (EGD) WITH PROPOFOL ;  Surgeon: Stephens Eis, MD;  Location: [facility] ENDOSCOPY;  Service: Gastroenterology;  Laterality: N/A;   ESOPHAGOGASTRODUODENOSCOPY (EGD) WITH PROPOFOL  N/A 02/12/2019   Procedure: ESOPHAGOGASTRODUODENOSCOPY (EGD) WITH PROPOFOL ;  Surgeon: Deveron Fly, MD;  Location: [facility] ENDOSCOPY;  Service: Endoscopy;  Laterality: N/A;   EYE SURGERY  08/2012   laser surgery on retina/ DR Appenzeller   FINGER SURGERY     repair of tendon in right index, Dr. Everardo Hitch in [facility]   FOOT SURGERY     Dr. Lara Plants   IRRIGATION AND DEBRIDEMENT SEBACEOUS CYST     right upper back   LAPAROSCOPIC SUPRACERVICAL HYSTERECTOMY  06/2007   Dr Kincius/ adhesions/CPP/adenomyosis   MASTECTOMY PARTIAL / LUMPECTOMY Right 01/2008   with sentinal lymph node biopsy/ infiltrating ductal cancer   REPAIR PERONEAL TENDONS ANKLE  10/2019   with tarsal exostectomy and sural neuroma excision on right    SKIN CANCER EXCISION     back and calves   WISDOM TOOTH  EXTRACTION  1991    Social History   Socioeconomic History   Marital status: Married    Spouse name: Siegfried Dress   Number of children: 1   Years of education: 14   Highest education level: Not on file  Occupational History   Occupation: CMA  Tobacco Use   Smoking status: Never   Smokeless tobacco: Never  Vaping Use   Vaping status: Never Used  Substance and Sexual Activity   Alcohol use: Yes    Alcohol/week: 0.0 standard drinks of alcohol    Comment: rare, 1-2 drinks per year   Drug use: No   Sexual activity: Yes    Partners: Male    Birth control/protection: Surgical    Comment: Hysterectomy   Other Topics Concern   Not on file  Social History Narrative   Lives in [place] with husband, no pets.  Works at [employer].      Diet - regular   Exercise - occasional   Social Drivers of Health   Financial Resource Strain: Low Risk  (11/23/2022)   Received from [facility]   Overall Financial Resource Strain (CARDIA)    Difficulty of Paying Living Expenses: Not hard at all  Food Insecurity: No Food Insecurity (11/23/2022)   Received from [facility]   Hunger Vital Sign    Within the past 12 months, you worried that your food would run out before you got the money to buy more.: Never true    Within the past 12 months, the food you bought just didn't last and you didn't have money to get more.: Never true  Transportation Needs: No Transportation Needs (11/23/2022)   Received from [facility] - Transportation    In the past 12 months, has lack of transportation kept you from medical appointments or from getting medications?: No    Lack of Transportation (Non-Medical): No  Physical Activity: Insufficiently Active (07/27/2017)   Exercise Vital Sign    Days of Exercise per Week: 4 days    Minutes of Exercise per Session: 30 min  Stress: No Stress Concern Present (07/27/2017)   [employer] of Occupational Health -  Occupational Stress Questionnaire    Feeling of Stress : Only a little  Social Connections: Socially Integrated (07/27/2017)   Social Connection and Isolation Panel    Frequency of Communication with Friends and Family: More than three times a week    Frequency of Social  Gatherings with Friends and Family: Once a week    Attends Religious Services: More than 4 times per year    Active Member of Clubs or Organizations: Yes    Attends Banker Meetings: More than 4 times per year    Marital Status: Married  Catering manager Violence: Not At Risk (07/27/2017)   Humiliation, Afraid, Rape, and Kick questionnaire    Fear of Current or Ex-Partner: No    Emotionally Abused: No    Physically Abused: No    Sexually Abused: No    Family History  Problem Relation Age of Onset   Hypertension Mother    Thyroid  disease Mother    Breast cancer Mother 74   Colon cancer Mother 55       again at 32   Atrial fibrillation Mother    Hip fracture Mother    Skin cancer Mother    Stroke Father    Hypertension Father    Cancer Father 76       prostate   Parkinson's disease Father    Skin cancer Father    Atrial fibrillation Sister        Pacemaker inserted end of Sept-beginning of Oct. 2024   Hypertension Sister    Thyroid  disease Sister    Cancer Sister        skin/ squamous cell   Heart Problems Sister    Diabetes Maternal Aunt    Cancer Maternal Aunt    Breast cancer Maternal Aunt 86   Lymphoma Maternal Uncle    Heart disease Maternal Uncle    Melanoma Maternal Uncle 90   Lung cancer Paternal Aunt 56       smoker   Diabetes Maternal Grandmother    Stroke Maternal Grandfather    Heart disease Paternal Grandfather    Thyroid  cancer Cousin        maternal   Breast cancer Cousin 66       maternal   Breast cancer Cousin 32   Colon cancer Cousin      Current Outpatient Medications:    Accu-Chek Softclix Lancets lancets, Use to check blood sugars in the morning, at noon, and  at bedtime., Disp: 100 each, Rfl: 0   Blood Glucose Monitoring Suppl (BLOOD GLUCOSE MONITOR SYSTEM) w/Device KIT, Use to test blood sugars in the morning, at noon, and at bedtime., Disp: 1 kit, Rfl: 0   Glucose Blood (BLOOD GLUCOSE TEST STRIPS) STRP, Use to test blood sugars in the morning, at noon, and at bedtime., Disp: 100 strip, Rfl: 0   Lancet Device MISC, 1 each by Does not apply route in the morning, at noon, and at bedtime. May substitute to any manufacturer covered by patient's insurance., Disp: 1 each, Rfl: 0   Magnesium Glycinate 100 MG CAPS, , Disp: , Rfl:    morphine  (MS CONTIN ) 30 MG 12 hr tablet, Take 1 tablet (30 mg total) by mouth every 12 (twelve) hours., Disp: 60 tablet, Rfl: 0   aspirin EC 81 MG tablet, Take by mouth., Disp: , Rfl:    b complex vitamins capsule, Take 1 capsule by mouth daily., Disp: , Rfl:    buPROPion  (WELLBUTRIN  XL) 150 MG 24 hr tablet, Take 1 tablet (150 mg total) by mouth daily. Take along a 300mg  tablet for a total daily dose of 450mg ., Disp: 90 tablet, Rfl: 3   buPROPion  (WELLBUTRIN  XL) 300 MG 24 hr tablet, Take 1 tablet (300 mg total) by mouth daily., Disp: 90 tablet, Rfl:  3   busPIRone  (BUSPAR ) 10 MG tablet, Take 1 tablet (10 mg total) by mouth 2 (two) times daily, Disp: 180 tablet, Rfl: 1   calcium carbonate (OSCAL) 1500 (600 Ca) MG TABS tablet, Take by mouth 2 (two) times daily with a meal., Disp: , Rfl:    capivasertib (TRUQAP) 200 MG tablet, Take 2 tablets (400 mg total) by mouth 2 (two) times daily. Take for 4 days, then hold for 3 days. Repeat every 7 days., Disp: 64 tablet, Rfl: 0   Cholecalciferol (VITAMIN D3) 10 MCG (400 UNIT) CAPS, Take by mouth., Disp: , Rfl:    citalopram  (CELEXA ) 40 MG tablet, Take 1 tablet (40 mg total) by mouth daily., Disp: 90 tablet, Rfl: 3   diphenhydrAMINE  (BENADRYL ) 25 MG tablet, Take 25 mg by mouth at bedtime as needed for sleep. Pt states daily now for sleep and allergies., Disp: , Rfl:    gabapentin  (NEURONTIN ) 100  MG capsule, Take 1 capsule (100 mg total) by mouth every morning AND 2 capsules (200 mg total) every evening., Disp: 270 capsule, Rfl: 1   HYDROmorphone (DILAUDID) 2 MG tablet, Take 0.5 tablets (1 mg total) by mouth every 6 (six) hours as needed for severe pain (pain score 7-10)., Disp: 120 tablet, Rfl: 0   ketotifen  (ZADITOR ) 0.025 % ophthalmic solution, Place 1 drop into both eyes daily., Disp: , Rfl:    lisinopril  (ZESTRIL ) 10 MG tablet, Take 1 tablet (10 mg total) by mouth daily., Disp: 90 tablet, Rfl: 1   loperamide  (IMODIUM ) 2 MG capsule, Take 2 tabs by mouth with first loose stool, then 1 tab with each additional loose stool as needed. Do not exceed 8 tabs in a 24-hour period, Disp: 30 capsule, Rfl: 0   metFORMIN  (GLUCOPHAGE -XR) 500 MG 24 hr tablet, Take 1 tablet (500 mg total) by mouth 2 (two) times daily with a meal., Disp: 180 tablet, Rfl: 1   Multiple Vitamin (MULTIVITAMIN) tablet, Take 1 tablet by mouth daily., Disp: , Rfl:    omeprazole  (PRILOSEC) 20 MG capsule, Take 1 capsule (20 mg total) by mouth daily., Disp: 90 capsule, Rfl: 3   ondansetron  (ZOFRAN ) 4 MG tablet, Take 1 tablet (4 mg total) by mouth every 8 (eight) hours as needed for nausea or vomiting., Disp: 90 tablet, Rfl: 0   ondansetron  (ZOFRAN ) 8 MG tablet, Take 1 tablet (8 mg total) by mouth every 8 (eight) hours as needed for nausea or vomiting., Disp: 30 tablet, Rfl: 1   prochlorperazine  (COMPAZINE ) 10 MG tablet, Take 1 tablet (10 mg total) by mouth every 6 (six) hours as needed for nausea or vomiting., Disp: 30 tablet, Rfl: 1  No results found.  No images are attached to the encounter.      Latest Ref Rng & Units 12/23/2023    9:28 AM  CMP  Glucose 70 - 99 mg/dL 829   BUN 6 - 20 mg/dL 18   Creatinine 5.62 - 1.00 mg/dL 1.30   Sodium 865 - 784 mmol/L 139   Potassium 3.5 - 5.1 mmol/L 4.9   Chloride 98 - 111 mmol/L 105   CO2 22 - 32 mmol/L 23   Calcium 8.9 - 10.3 mg/dL 8.6   Total Protein 6.5 - 8.1 g/dL 7.1   Total  Bilirubin 0.0 - 1.2 mg/dL 0.4   Alkaline Phos 38 - 126 U/L 110   AST 15 - 41 U/L 39   ALT 0 - 44 U/L 33       Latest Ref Rng &  Units 12/23/2023    9:28 AM  CBC  WBC 4.0 - 10.5 K/uL 7.1   Hemoglobin 12.0 - 15.0 g/dL 16.1   Hematocrit 09.6 - 46.0 % 34.3   Platelets 150 - 400 K/uL 232      Observation/objective: Appears in no acute distress over video visit today.  Breathing is normal  Assessment and plan: Patient is a 63 year old female with history of metastatic ER/PR positive HER2 negative breast cancer with bone only metastases here to discuss further management  We reviewed patient's scans at tumor board today.  As compared to her bone scan in March 2025 there is increasing activity noted especially in her lumbar spine as well as pelvis.  Also her Tumor markers have gone up with CA 27-29 that went up from 299 a month ago presently to 428.  She has therefore had progression on her last Cystaran.  Have asked her to hold off on taking that medicine at this time and I will plan to switch her to capivasertib plus fulvestrant given that she has PIK3CA mutation.  I will also refer her to [facility] medical oncology for second opinion.  She has seen them in the past.  Discussed risks and benefits of In basilar detail including all but not limited to nausea vomiting diarrhea low blood counts, skin rash, abnormal LFTs, hyperglycemia, hypertriglyceridemia.  Patient is a diabetic but is not insulin -dependent and is currently on metformin .  Patient is on capivasertib can experience significant hyperglycemia especially within the first 15 to 30 days of starting treatment and therefore intends blood sugar monitoring is recommended.  Patient will start fulvestrant on 01/03/2024 along with Capivasertib on that day which will be day 1 week 1 for her.  Fasting blood sugar, hb A1c, lipid panel next week when she comes for faslodex (early morning appt). See me on 7/3 with cbc with diff, cmp, ca 27.29. patient also  comes for fasting blood sugar 6/27, 7/3, 7/18, 8/1, 8/15. See Alyson leonard on 7/17 and see me again on 8/15. Cbc with diff and cmp on7/17 and 8/15.   A randomized phase III trial (CAPItello-291) has shown PFS benefits with the addition of capivasertib to fulvestrant therapy among patients with progression on a previous AI, with or without a CDK 4/6 inhibitor  Among 708 patients, the median PFS was 7.2 months in the capivasertib-fulvestrant group versus 3.6 months in the placebo-fulvestrant group (HR 0.60, 95% CI 0.51-0.71). Among patients with PI3K pathway-altered (PIK3CA, AKT1, or PTEN) tumors, benefits were comparable (PFS 7.3 versus 3.1 months; HR 0.50, 95% CI 0.38-0.65). The estimated OS at 18 months was 74 percent with capivasertib-fulvestrant and 65 percent with placebo-fulvestrant in the overall population (HR 0.74, 95% CI 0.56-0.98); and 73 versus 63 percent, respectively, in the AKT pathway-altered population (HR 0.69, 95% CI 0.45-1.05). While the outcomes in patients previously treated with a CDK 4/6 inhibitor also favored capivasertib, the absolute gains in PFS were smaller than in the overall group.  Although Inavolisib + Ibrance  + Faslodex has been approved in first-line metastatic ER positive breast cancer which are PIK 3 mutated-this study was just reported this month and patient has already had progression on prior CDK inhibitor but has not been on Faslodex.Her last biopsy from Jan 2022 showed her 2 0 score so she is not a candidate for Enhertu.  Bone metastases: Patient gets Zometa  every 3 months and would be due for her next dose in September 2025  Follow-up instructions:as above  I discussed the assessment and  treatment plan with the patient. The patient was provided an opportunity to ask questions and all were answered. The patient agreed with the plan and demonstrated an understanding of the instructions.   The patient was advised to call back or seek an in-person evaluation if the  symptoms worsen or if the condition fails to improve as anticipated.  I provided 30 minutes of face-to-face video visit time during this encounter, and > 50% was spent counseling as documented under my assessment & plan.  Visit Diagnosis: 1. Primary malignant neoplasm of breast with metastasis (HCC)   2. Goals of care, counseling/discussion     Dr. Seretha Dance, MD, MPH [facility] Tel- [phone] 12/30/2023 12:45 PM

## 2023-12-30 NOTE — Telephone Encounter (Signed)
 Oral Oncology Patient Advocate Encounter  Prior Authorization for Gustav Lehmann has been approved.    PA# [id number] Effective dates: 12/29/2023 through 12/28/2024  Patients co-pay is $0.    [place] ([name]) Benjaman Branch, CPhT  [facility], [place], Cristine Done, [place] Oral Chemotherapy Patient Advocate Phone: [phone]  Fax: [phone]

## 2023-12-30 NOTE — Progress Notes (Signed)
 Specialty Pharmacy Initiation Note   Jacqueline Deleon is a 63 y.o. female who will be followed by the specialty pharmacy service for RxSp Oncology    Review of administration, indication, effectiveness, safety, potential side effects, storage/disposable, and missed dose instructions occurred today for patient's specialty medication(s) Capivasertib (TRUQAP)     Patient/Caregiver did not have any additional questions or concerns.   Patient's therapy is appropriate to: Initiate    Goals Addressed             This Visit's Progress    Slow Disease Progression       Patient is initiating therapy. Patient will maintain adherence         Adriene Knipfer N Zaleigh [place] Specialty Pharmacist

## 2023-12-30 NOTE — Progress Notes (Signed)
 Specialty Pharmacy Initial Fill Coordination Note  Jacqueline Deleon is a 63 y.o. female contacted today regarding refills of specialty medication(s) Capivasertib (TRUQAP) .  Patient requested Delivery  on 01/03/24  to verified address [address] [place] [facility] [address]   Medication will be filled on 01/02/2024.   Patient is aware of $0 copayment.   Kendrik Mcshan ([name]) Benjaman Branch, CPhT  [facility], [place], [place], [place] Oral Chemotherapy Patient Advocate Phone: [phone]  Fax: [phone]

## 2023-12-31 ENCOUNTER — Other Ambulatory Visit: Payer: Self-pay

## 2024-01-02 ENCOUNTER — Other Ambulatory Visit: Payer: Self-pay

## 2024-01-03 ENCOUNTER — Inpatient Hospital Stay

## 2024-01-03 ENCOUNTER — Inpatient Hospital Stay: Admitting: Pharmacist

## 2024-01-03 ENCOUNTER — Other Ambulatory Visit: Payer: Self-pay

## 2024-01-03 DIAGNOSIS — C50919 Malignant neoplasm of unspecified site of unspecified female breast: Secondary | ICD-10-CM

## 2024-01-03 DIAGNOSIS — Z5111 Encounter for antineoplastic chemotherapy: Secondary | ICD-10-CM | POA: Diagnosis not present

## 2024-01-03 LAB — LIPID PANEL
Cholesterol: 155 mg/dL (ref 0–200)
HDL: 56 mg/dL (ref >40)
LDL Cholesterol: 83 mg/dL (ref 0–99)
Total CHOL/HDL Ratio: 2.8 ratio
Triglycerides: 81 mg/dL (ref <150)
VLDL: 16 mg/dL (ref 0–40)

## 2024-01-03 LAB — HEMOGLOBIN A1C
Hgb A1c MFr Bld: 6.2 % — ABNORMAL HIGH (ref 4.8–5.6)
Mean Plasma Glucose: 131.24 mg/dL

## 2024-01-03 LAB — GLUCOSE, RANDOM: Glucose, Bld: 106 mg/dL — ABNORMAL HIGH (ref 70–99)

## 2024-01-03 MED ORDER — FULVESTRANT 250 MG/5ML IM SOSY
500.0000 mg | PREFILLED_SYRINGE | Freq: Once | INTRAMUSCULAR | Status: AC
Start: 2024-01-03 — End: 2024-01-03
  Administered 2024-01-03: 500 mg via INTRAMUSCULAR
  Filled 2024-01-03: qty 10

## 2024-01-03 NOTE — Progress Notes (Signed)
 Clinical Pharmacist Practitioner Clinic [facility]  [phone] [phone]  Patient Care Team: Diedra Lame, MD as PCP - General (Family Medicine) Cindie Ole DASEN, MD as PCP - Electrophysiology (Cardiology) Melanee Annah BROCKS, MD as Consulting Physician (Oncology)   Name of the patient: Jacqueline Deleon  [id number]  01/12/1961   Date of visit: 01/03/24  HPI: Patient is a 63 y.o. 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 Planned treatment with Truqap  (capivasertib ) and fulvestrant.   Reason for Consult: Capivasertinib oral chemotherapy education.   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   Chicken pox    Depression    Fatty liver    Fatty liver    Gastric polyps    GERD (gastroesophageal reflux disease)    Gestational diabetes    prediabetes out side of having baby   Headache    migraines   Heart murmur    History of abnormal mammogram 08/2006, 01/2008, 03/2009   Hypertension    IBS (irritable bowel syndrome)    Joint disorder    hx of left ankle tendonitis, bursitis, heel spur   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   Obesity 2018   BMI 45   Pelvic pain    adhesions and adenomyosis   Personal history of radiation therapy    Rosacea     HEMATOLOGY/ONCOLOGY HISTORY:  Oncology History  Primary malignant neoplasm of breast with metastasis (HCC)  08/19/2020 Cancer Staging   Staging form: Breast, AJCC 8th Edition - Clinical stage from 08/19/2020: Stage IV (rcTX,  cNX, cM1, G3, ER+, PR+, HER2-) - Signed by Melanee Annah BROCKS, MD on 08/23/2020 Stage prefix: Recurrence   08/23/2020 Initial Diagnosis   Metastatic breast cancer (HCC)   08/23/2020 - 08/23/2020 Chemotherapy   Patient is on Treatment Plan : BREAST Palbociclib  / Letrozole  q28d     01/03/2024 -  Chemotherapy   Patient is on Treatment Plan : BREAST Abemaciclib  + Fulvestrant q28d       ALLERGIES:  is allergic to kiwi extract, codeine, food, amoxicillin, erythromycin, sulfa  antibiotics, and tape.  MEDICATIONS:  Current Outpatient Medications  Medication Sig Dispense Refill   Accu-Chek Softclix Lancets lancets Use to check blood sugars in the morning, at noon, and at bedtime. 100 each 0   aspirin EC 81 MG tablet Take by mouth.     b complex vitamins capsule Take 1 capsule by mouth daily.     Blood Glucose Monitoring Suppl (BLOOD GLUCOSE MONITOR SYSTEM) w/Device KIT Use to test blood sugars in the morning, at noon, and at bedtime. 1 kit 0   buPROPion  (WELLBUTRIN  XL) 150 MG 24 hr tablet Take 1 tablet (150 mg total) by mouth daily. Take along a 300mg  tablet for a total daily dose of 450mg . 90 tablet 3   buPROPion  (WELLBUTRIN  XL) 300 MG 24 hr tablet Take 1 tablet (300 mg total) by mouth daily. 90 tablet 3   busPIRone  (BUSPAR ) 10 MG tablet Take 1 tablet (  10 mg total) by mouth 2 (two) times daily 180 tablet 1   calcium carbonate (OSCAL) 1500 (600 Ca) MG TABS tablet Take by mouth 2 (two) times daily with a meal.     capivasertib  (TRUQAP ) 200 MG tablet Take 2 tablets (400 mg total) by mouth 2 (two) times daily. Take for 4 days, then hold for 3 days. Repeat every 7 days. 64 tablet 0   Cholecalciferol (VITAMIN D3) 10 MCG (400 UNIT) CAPS Take by mouth.     citalopram  (CELEXA ) 40 MG tablet Take 1 tablet (40 mg total) by mouth daily. 90 tablet 3   diphenhydrAMINE  (BENADRYL ) 25 MG tablet Take 25 mg by mouth at bedtime as needed for sleep. Pt states daily now for sleep and allergies.     gabapentin  (NEURONTIN ) 100  MG capsule Take 1 capsule (100 mg total) by mouth every morning AND 2 capsules (200 mg total) every evening. 270 capsule 1   Glucose Blood (BLOOD GLUCOSE TEST STRIPS) STRP Use to test blood sugars in the morning, at noon, and at bedtime. 100 strip 0   HYDROmorphone  (DILAUDID ) 2 MG tablet Take 0.5 tablets (1 mg total) by mouth every 6 (six) hours as needed for severe pain (pain score 7-10). 120 tablet 0   ketotifen  (ZADITOR ) 0.025 % ophthalmic solution Place 1 drop into both eyes daily.     Lancet Device MISC 1 each by Does not apply route in the morning, at noon, and at bedtime. May substitute to any manufacturer covered by patient's insurance. 1 each 0   lisinopril  (ZESTRIL ) 10 MG tablet Take 1 tablet (10 mg total) by mouth daily. 90 tablet 1   loperamide  (IMODIUM ) 2 MG capsule Take 2 tabs by mouth with first loose stool, then 1 tab with each additional loose stool as needed. Do not exceed 8 tabs in a 24-hour period 30 capsule 0   Magnesium Glycinate 100 MG CAPS      metFORMIN  (GLUCOPHAGE -XR) 500 MG 24 hr tablet Take 1 tablet (500 mg total) by mouth 2 (two) times daily with a meal. 180 tablet 1   morphine  (MS CONTIN ) 30 MG 12 hr tablet Take 1 tablet (30 mg total) by mouth every 12 (twelve) hours. 60 tablet 0   Multiple Vitamin (MULTIVITAMIN) tablet Take 1 tablet by mouth daily.     omeprazole  (PRILOSEC) 20 MG capsule Take 1 capsule (20 mg total) by mouth daily. 90 capsule 3   ondansetron  (ZOFRAN ) 4 MG tablet Take 1 tablet (4 mg total) by mouth every 8 (eight) hours as needed for nausea or vomiting. 90 tablet 0   ondansetron  (ZOFRAN ) 8 MG tablet Take 1 tablet (8 mg total) by mouth every 8 (eight) hours as needed for nausea or vomiting. 30 tablet 1   prochlorperazine  (COMPAZINE ) 10 MG tablet Take 1 tablet (10 mg total) by mouth every 6 (six) hours as needed for nausea or vomiting. 30 tablet 1   No current facility-administered medications for this visit.    VITAL SIGNS: There were no vitals taken  for this visit. There were no vitals filed for this visit.  Estimated body mass index is 40.92 kg/m as calculated from the following:   Height as of 09/26/23: 5' 3 (1.6 m).   Weight as of 12/23/23: 104.8 kg (231 lb).  LABS: CBC:    Component Value Date/Time   WBC 7.1 12/23/2023 0928   WBC 6.3 08/26/2023 0947   HGB 10.5 (L) 12/23/2023 0928   HGB 11.9 03/06/2020 0811  HCT 34.3 (L) 12/23/2023 0928   HCT 38.6 03/06/2020 0811   PLT 232 12/23/2023 0928   PLT 253 03/06/2020 0811   MCV 82.1 12/23/2023 0928   MCV 80 03/06/2020 0811   NEUTROABS 5.2 12/23/2023 0928   NEUTROABS 4.7 03/06/2020 0811   LYMPHSABS 1.1 12/23/2023 0928   LYMPHSABS 1.6 03/06/2020 0811   MONOABS 0.7 12/23/2023 0928   EOSABS 0.1 12/23/2023 0928   EOSABS 0.1 03/06/2020 0811   BASOSABS 0.0 12/23/2023 0928   BASOSABS 0.0 03/06/2020 0811   Comprehensive Metabolic Panel:    Component Value Date/Time   NA 139 12/23/2023 0928   NA 141 03/06/2020 0811   K 4.9 12/23/2023 0928   CL 105 12/23/2023 0928   CO2 23 12/23/2023 0928   BUN 18 12/23/2023 0928   BUN 18 03/06/2020 0811   CREATININE 1.28 (H) 12/23/2023 0928   GLUCOSE 106 (H) 01/03/2024 0804   CALCIUM 8.6 (L) 12/23/2023 0928   AST 39 12/23/2023 0928   ALT 33 12/23/2023 0928   ALKPHOS 110 12/23/2023 0928   BILITOT 0.4 12/23/2023 0928   PROT 7.1 12/23/2023 0928   PROT 6.6 03/06/2020 0811   ALBUMIN 3.4 (L) 12/23/2023 0928   ALBUMIN 4.2 03/06/2020 0811     Present during today's visit: patient and her husband  Start plan: Patient will start capivasertib  today 01/03/24   Patient Education I spoke with patient for overview of new oral chemotherapy medication: capivasertib    Administration: Counseled patient on administration, dosing, side effects, monitoring, drug-food interactions, safe handling, storage, and disposal. Patient will take 2 tablets (400 mg total) by mouth 2 (two) times daily. Take for 4 days, then hold for 3 days. Repeat every 7 days.    Side Effects: Side effects include but not limited to: rash, hyperglycemia, fatigue, diarrhea, decreased wbc/hgb, nausea, mouth sores. Diarrhea: Patient knows to use loperamide  as needed and call the office if they are having 4 or more loose stools per day. Mouth sores: Patient knows to request magic mouthwash if needed Rash: Patient reports already taking cetirizine daily, she know this will help with rash prevention  Drug-drug Interactions (DDI): No relevant DDIs with capivasertib  identified.   Adherence: After discussion with patient no patient barriers to medication adherence identified.  Reviewed with patient importance of keeping a medication schedule and plan for any missed doses.  The [facility] voiced understanding and appreciation. All questions answered. Medication handout provided.  Provided patient with Oral Chemotherapy Navigation Clinic phone number. Patient knows to call the office with questions or concerns. Oral Chemotherapy Navigation Clinic will continue to follow.  Patient expressed understanding and was in agreement with this plan. She also understands that She can call clinic at any time with any questions, concerns, or complaints.   Medication Access Issues: No issues, patient fills medication at [facility] ([facility])  Follow-up plan: RTC as scheduled  Thank you for allowing me to participate in the care of this patient.   Time Total: 25 mins  Visit consisted of counseling and education on dealing with issues of symptom management in the setting of serious and potentially life-threatening illness.Greater than 50%  of this time was spent counseling and coordinating care related to the above assessment and plan.  Signed by: Nylah Butkus N. Tahirih Lair, PharmD, NEILA, CPP Hematology/Oncology Clinical Pharmacist Practitioner [facility] [phone]  01/03/2024 4:22 PM

## 2024-01-04 ENCOUNTER — Other Ambulatory Visit: Payer: Self-pay

## 2024-01-04 ENCOUNTER — Encounter: Payer: Self-pay | Admitting: Oncology

## 2024-01-05 ENCOUNTER — Other Ambulatory Visit: Payer: Self-pay

## 2024-01-05 ENCOUNTER — Telehealth: Payer: Self-pay

## 2024-01-05 DIAGNOSIS — C50919 Malignant neoplasm of unspecified site of unspecified female breast: Secondary | ICD-10-CM

## 2024-01-05 NOTE — Telephone Encounter (Signed)
 Spoke with Scheduling dept, pt's ins. Is out of network. They have her in review with [employer]. Advised to call back in 2 days.

## 2024-01-06 ENCOUNTER — Other Ambulatory Visit

## 2024-01-06 ENCOUNTER — Ambulatory Visit

## 2024-01-06 ENCOUNTER — Inpatient Hospital Stay

## 2024-01-06 DIAGNOSIS — Z5111 Encounter for antineoplastic chemotherapy: Secondary | ICD-10-CM | POA: Diagnosis not present

## 2024-01-06 DIAGNOSIS — C50919 Malignant neoplasm of unspecified site of unspecified female breast: Secondary | ICD-10-CM

## 2024-01-06 LAB — CBC WITH DIFFERENTIAL (CANCER CENTER ONLY)
Abs Immature Granulocytes: 0.03 10*3/uL (ref 0.00–0.07)
Basophils Absolute: 0 10*3/uL (ref 0.0–0.1)
Basophils Relative: 1 %
Eosinophils Absolute: 0.2 10*3/uL (ref 0.0–0.5)
Eosinophils Relative: 4 %
HCT: 35.2 % — ABNORMAL LOW (ref 36.0–46.0)
Hemoglobin: 10.6 g/dL — ABNORMAL LOW (ref 12.0–15.0)
Immature Granulocytes: 1 %
Lymphocytes Relative: 22 %
Lymphs Abs: 1.3 10*3/uL (ref 0.7–4.0)
MCH: 24.4 pg — ABNORMAL LOW (ref 26.0–34.0)
MCHC: 30.1 g/dL (ref 30.0–36.0)
MCV: 80.9 fL (ref 80.0–100.0)
Monocytes Absolute: 0.6 10*3/uL (ref 0.1–1.0)
Monocytes Relative: 11 %
Neutro Abs: 3.9 10*3/uL (ref 1.7–7.7)
Neutrophils Relative %: 61 %
Platelet Count: 317 10*3/uL (ref 150–400)
RBC: 4.35 MIL/uL (ref 3.87–5.11)
RDW: 17.5 % — ABNORMAL HIGH (ref 11.5–15.5)
WBC Count: 6.1 10*3/uL (ref 4.0–10.5)
nRBC: 0 % (ref 0.0–0.2)

## 2024-01-06 LAB — CMP (CANCER CENTER ONLY)
ALT: 62 U/L — ABNORMAL HIGH (ref 0–44)
AST: 36 U/L (ref 15–41)
Albumin: 3.3 g/dL — ABNORMAL LOW (ref 3.5–5.0)
Alkaline Phosphatase: 117 U/L (ref 38–126)
Anion gap: 9 (ref 5–15)
BUN: 24 mg/dL — ABNORMAL HIGH (ref 6–20)
CO2: 24 mmol/L (ref 22–32)
Calcium: 9.2 mg/dL (ref 8.9–10.3)
Chloride: 104 mmol/L (ref 98–111)
Creatinine: 1.09 mg/dL — ABNORMAL HIGH (ref 0.44–1.00)
GFR, Estimated: 58 mL/min — ABNORMAL LOW (ref >60)
Glucose, Bld: 123 mg/dL — ABNORMAL HIGH (ref 70–99)
Potassium: 4.5 mmol/L (ref 3.5–5.1)
Sodium: 137 mmol/L (ref 135–145)
Total Bilirubin: 0.5 mg/dL (ref 0.0–1.2)
Total Protein: 7.3 g/dL (ref 6.5–8.1)

## 2024-01-12 ENCOUNTER — Other Ambulatory Visit (HOSPITAL_COMMUNITY): Payer: Self-pay

## 2024-01-12 ENCOUNTER — Inpatient Hospital Stay: Admitting: Oncology

## 2024-01-12 ENCOUNTER — Encounter: Payer: Self-pay | Admitting: Oncology

## 2024-01-12 ENCOUNTER — Inpatient Hospital Stay: Attending: Oncology

## 2024-01-12 VITALS — BP 92/54 | HR 72 | Temp 97.1°F | Resp 19 | Ht 63.0 in | Wt 229.1 lb

## 2024-01-12 DIAGNOSIS — Z79818 Long term (current) use of other agents affecting estrogen receptors and estrogen levels: Secondary | ICD-10-CM

## 2024-01-12 DIAGNOSIS — Z170 Estrogen receptor positive status [ER+]: Secondary | ICD-10-CM | POA: Insufficient documentation

## 2024-01-12 DIAGNOSIS — C50919 Malignant neoplasm of unspecified site of unspecified female breast: Secondary | ICD-10-CM

## 2024-01-12 DIAGNOSIS — C50911 Malignant neoplasm of unspecified site of right female breast: Secondary | ICD-10-CM | POA: Diagnosis not present

## 2024-01-12 DIAGNOSIS — Z5111 Encounter for antineoplastic chemotherapy: Secondary | ICD-10-CM | POA: Insufficient documentation

## 2024-01-12 DIAGNOSIS — C7951 Secondary malignant neoplasm of bone: Secondary | ICD-10-CM | POA: Diagnosis present

## 2024-01-12 DIAGNOSIS — Z79899 Other long term (current) drug therapy: Secondary | ICD-10-CM | POA: Diagnosis not present

## 2024-01-12 LAB — CMP (CANCER CENTER ONLY)
ALT: 37 U/L (ref 0–44)
AST: 27 U/L (ref 15–41)
Albumin: 3.3 g/dL — ABNORMAL LOW (ref 3.5–5.0)
Alkaline Phosphatase: 108 U/L (ref 38–126)
Anion gap: 7 (ref 5–15)
BUN: 21 mg/dL — ABNORMAL HIGH (ref 6–20)
CO2: 25 mmol/L (ref 22–32)
Calcium: 8.6 mg/dL — ABNORMAL LOW (ref 8.9–10.3)
Chloride: 105 mmol/L (ref 98–111)
Creatinine: 1.23 mg/dL — ABNORMAL HIGH (ref 0.44–1.00)
GFR, Estimated: 50 mL/min — ABNORMAL LOW (ref >60)
Glucose, Bld: 129 mg/dL — ABNORMAL HIGH (ref 70–99)
Potassium: 4.6 mmol/L (ref 3.5–5.1)
Sodium: 137 mmol/L (ref 135–145)
Total Bilirubin: 0.3 mg/dL (ref 0.0–1.2)
Total Protein: 6.7 g/dL (ref 6.5–8.1)

## 2024-01-12 LAB — CBC WITH DIFFERENTIAL (CANCER CENTER ONLY)
Abs Immature Granulocytes: 0.02 10*3/uL (ref 0.00–0.07)
Basophils Absolute: 0 10*3/uL (ref 0.0–0.1)
Basophils Relative: 1 %
Eosinophils Absolute: 0.2 10*3/uL (ref 0.0–0.5)
Eosinophils Relative: 4 %
HCT: 34.9 % — ABNORMAL LOW (ref 36.0–46.0)
Hemoglobin: 10.4 g/dL — ABNORMAL LOW (ref 12.0–15.0)
Immature Granulocytes: 0 %
Lymphocytes Relative: 43 %
Lymphs Abs: 2.4 10*3/uL (ref 0.7–4.0)
MCH: 24.6 pg — ABNORMAL LOW (ref 26.0–34.0)
MCHC: 29.8 g/dL — ABNORMAL LOW (ref 30.0–36.0)
MCV: 82.5 fL (ref 80.0–100.0)
Monocytes Absolute: 0.5 10*3/uL (ref 0.1–1.0)
Monocytes Relative: 8 %
Neutro Abs: 2.5 10*3/uL (ref 1.7–7.7)
Neutrophils Relative %: 44 %
Platelet Count: 302 10*3/uL (ref 150–400)
RBC: 4.23 MIL/uL (ref 3.87–5.11)
RDW: 18.5 % — ABNORMAL HIGH (ref 11.5–15.5)
WBC Count: 5.6 10*3/uL (ref 4.0–10.5)
nRBC: 0 % (ref 0.0–0.2)

## 2024-01-12 LAB — GLUCOSE, RANDOM: Glucose, Bld: 131 mg/dL — ABNORMAL HIGH (ref 70–99)

## 2024-01-12 NOTE — Telephone Encounter (Signed)
 Outbound call; on hold 10+ min.  Called back, on hold for approximately 2 minutes then the call disconnected.  Called back again; a party answered (much static heard in the background) and then the call disconnected during mid conversation.  Fourth and fifth call attempts made; call disconnected while phone line was still ringing.  Tagging Heather H. On this as well as to follow up next week when their phone systems are working properly.  If still out of network with [facility]. Melanee mentioned possibly looking into [facility].

## 2024-01-12 NOTE — Progress Notes (Signed)
 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  It was a 1.4 cm tumor with negative lymph nodes grade 1 and patient went on to get lumpectomy followed by adjuvant radiation therapy.  Oncotype DX score was 6 and she did not require adjuvant chemotherapy.  She was on tamoxifen for 5 years.  She had BRCA testing done at that time which was negative.   Patient felt a lump in her right axilla in September 2021.  She underwent ultrasound of bilateral breasts which did not pick up any axillary mass.  A prior screening mammogram in September 2021 was unremarkable.  She was then admitted for Covid pneumonia and underwent CT angio chest on 07/22/2020 which incidentally picked up an axillary mass measuring 2.5 cm.  No obvious breast mass.  Axillary mass was biopsied and was consistent with high-grade invasive mammary carcinoma.  IHC was positive for GATA3 with diffuse strong nuclear staining.  There was no lymph node architecture identified in the sample and it could not be determined if it is a primary tumor within the axillary breast tissue or metastases.  ER strongly positive greater than 90%, PR 1 to 10% positive and HER-2 negative   PET CT scan showed hypermetabolic poorly marginated solid right axillary mass 2.5 x 2 cm with an SUV  of 5.6.  No enlarged hypermetabolic mediastinal or hilar lymph nodes no enlarged left axillary lymph nodes.  Subcentimeter lung nodules below PET resolution.  Small hypermetabolism in the right liver with an SUV of 6 without CT correlate equivocal for liver metastases.  Multiple hypermetabolic faintly lytic osseous lesions throughout the lumbar spine and bilateral pelvic girdle with representative supra-acetabular right iliac bone 1.9 cm lesion, anterior superior left acetabular 1.7 cm lesion and L1 1.2 cm lesion.   NGS testing showed no actionable mutations.  PD-L1 less than 1%.  CHEK2 S422 FS, PIK3CA N1068 FS, PIK3CA N345H, CCN E1 gain, ERB B2 1 P-CSF 1R fusion, FGF 19 gain, FGF 3 gain, FGF 4gain.   Ibrance  plus letrozole  started in February 2022.    Patient noted to have increase in CA 27-29 from 26 and November 2023 203 in July 2024.  CT chest abdomen pelvis with contrast and bone scan showed worsening metastatic disease in the bones especially in the thoracolumbar spine and right hemipelvis.    Peripheral blood NGS testing showed CHEK2 mutation, PIK3CA P.N1068 FS, PIK3CA P.N345H, PTEN P.R173S, ESR 1P.D538G, ESR 1P.L536R, PIK3CA P.R88Q.  MSI high not detected  Elacestrant  started in sept 2024.  Disease progression noted in June 2025 and given the presence of PIK3CA mutation patient switched capivasertib  plus fulvestrant    Interval history-patient started capivasertib  Plus Faslodex  in June 2025 and it has been roughly 10 days now since starting the drug.  Blood sugars have been well-controlled.  She is also on an increased dose of long-acting morphine  30 mg twice daily along  with as needed Dilaudid .  She has noticed some  episodes of twitching during the day and sometimes at night when she falls asleep.  This has not been bothering her.  Pain has been well-controlled.  ECOG PS- 1 Pain scale- 0   Review of systems- Review of Systems  Constitutional:  Negative for chills, fever, malaise/fatigue and  weight loss.  HENT:  Negative for congestion, ear discharge and nosebleeds.   Eyes:  Negative for blurred vision.  Respiratory:  Negative for cough, hemoptysis, sputum production, shortness of breath and wheezing.   Cardiovascular:  Negative for chest pain, palpitations, orthopnea and claudication.  Gastrointestinal:  Negative for abdominal pain, blood in stool, constipation, diarrhea, heartburn, melena, nausea and vomiting.  Genitourinary:  Negative for dysuria, flank pain, frequency, hematuria and urgency.  Musculoskeletal:  Negative for back pain, joint pain and myalgias.  Skin:  Negative for rash.  Neurological:  Negative for dizziness, tingling, focal weakness, seizures, weakness and headaches.  Endo/Heme/Allergies:  Does not bruise/bleed easily.  Psychiatric/Behavioral:  Negative for depression and suicidal ideas. The patient does not have insomnia.       Allergies  Allergen Reactions   Kiwi Extract Itching and Swelling    The real kiwi fruit   Codeine Other (See Comments)    Felt funny.   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   Chicken pox    Depression    Fatty liver    Fatty liver    Gastric polyps    GERD (gastroesophageal reflux disease)    Gestational diabetes    prediabetes out side of having baby   Headache    migraines   Heart murmur    History of abnormal mammogram 08/2006, 01/2008, 03/2009   Hypertension    IBS (irritable bowel syndrome)     Joint disorder    hx of left ankle tendonitis, bursitis, heel spur   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 Smith/ laprascopic surgery   COLONOSCOPY  04/04/2000   COLONOSCOPY WITH PROPOFOL  N/A 02/12/2019   Procedure: COLONOSCOPY WITH PROPOFOL ;  Surgeon: Gaylyn Gladis PENNER,  MD;  Location: [facility] ENDOSCOPY;  Service: Endoscopy;  Laterality: N/A;   ESOPHAGOGASTRODUODENOSCOPY (EGD) WITH PROPOFOL  N/A 05/01/2015   Procedure: ESOPHAGOGASTRODUODENOSCOPY (EGD) WITH PROPOFOL ;  Surgeon: Deward CINDERELLA Piedmont, MD;  Location: [facility] ENDOSCOPY;  Service: Gastroenterology;  Laterality: N/A;   ESOPHAGOGASTRODUODENOSCOPY (EGD) WITH PROPOFOL  N/A 02/12/2019   Procedure: ESOPHAGOGASTRODUODENOSCOPY (EGD) WITH PROPOFOL ;  Surgeon: Gaylyn Gladis PENNER, MD;  Location: [facility] ENDOSCOPY;  Service: Endoscopy;  Laterality: N/A;   EYE SURGERY  08/2012   laser surgery on retina/ DR Appenzeller   FINGER SURGERY     repair of tendon in right index, Dr. Nancyann in [facility]   FOOT SURGERY     Dr. Verta   IRRIGATION AND DEBRIDEMENT SEBACEOUS CYST     right upper back   LAPAROSCOPIC SUPRACERVICAL HYSTERECTOMY  06/2007   Dr Kincius/ adhesions/CPP/adenomyosis   MASTECTOMY PARTIAL / LUMPECTOMY Right 01/2008   with sentinal lymph node biopsy/ infiltrating ductal cancer   REPAIR PERONEAL TENDONS ANKLE  10/2019    with tarsal exostectomy and sural neuroma excision on right    SKIN CANCER EXCISION     back and calves   WISDOM TOOTH EXTRACTION  1991    Social History   Socioeconomic History   Marital status: Married    Spouse name: Marcey   Number of children: 1   Years of education: 14   Highest education level: Not on file  Occupational History   Occupation: CMA  Tobacco Use   Smoking status: Never   Smokeless tobacco: Never  Vaping Use   Vaping status: Never Used  Substance and Sexual Activity   Alcohol use: Yes    Alcohol/week: 0.0 standard drinks of alcohol    Comment: rare, 1-2 drinks per year   Drug use: No   Sexual activity: Yes    Partners: Male    Birth control/protection: Surgical    Comment: Hysterectomy   Other Topics Concern   Not on file  Social History Narrative   Lives in [place] with husband, no pets.  Works at [employer].      Diet - regular   Exercise - occasional   Social Drivers of Health   Financial Resource Strain: Low Risk  (11/23/2022)   Received from [facility]   Overall Financial Resource Strain (CARDIA)    Difficulty of Paying Living Expenses: Not hard at all  Food Insecurity: No Food Insecurity (11/23/2022)   Received from [facility]   Hunger Vital Sign    Within the past 12 months, you worried that your food would run out before you got the money to buy more.: Never true    Within the past 12 months, the food you bought just didn't last and you didn't have money to get more.: Never true  Transportation Needs: No Transportation Needs (11/23/2022)   Received from [facility] - Transportation    In the past 12 months, has lack of transportation kept you from medical appointments or from getting medications?: No    Lack of Transportation (Non-Medical): No  Physical Activity: Insufficiently Active (07/27/2017)   Exercise Vital Sign    Days of Exercise per Week: 4 days    Minutes of  Exercise per Session: 30 min  Stress: No Stress Concern Present (07/27/2017)   [employer] of Occupational Health - Occupational Stress Questionnaire    Feeling of Stress : Only a little  Social Connections: Socially Integrated (07/27/2017)   Social Connection and Isolation Panel  Frequency of Communication with Friends and Family: More than three times a week    Frequency of Social Gatherings with Friends and Family: Once a week    Attends Religious Services: More than 4 times per year    Active Member of Clubs or Organizations: Yes    Attends Banker Meetings: More than 4 times per year    Marital Status: Married  Catering manager Violence: Not At Risk (07/27/2017)   Humiliation, Afraid, Rape, and Kick questionnaire    Fear of Current or Ex-Partner: No    Emotionally Abused: No    Physically Abused: No    Sexually Abused: No    Family History  Problem Relation Age of Onset   Hypertension Mother    Thyroid  disease Mother    Breast cancer Mother 27   Colon cancer Mother 30       again at 86   Atrial fibrillation Mother    Hip fracture Mother    Skin cancer Mother    Stroke Father    Hypertension Father    Cancer Father 30       prostate   Parkinson's disease Father    Skin cancer Father    Atrial fibrillation Sister        Pacemaker inserted end of Sept-beginning of Oct. 2024   Hypertension Sister    Thyroid  disease Sister    Cancer Sister        skin/ squamous cell   Heart Problems Sister    Diabetes Maternal Aunt    Cancer Maternal Aunt    Breast cancer Maternal Aunt 75   Lymphoma Maternal Uncle    Heart disease Maternal Uncle    Melanoma Maternal Uncle 67   Lung cancer Paternal Aunt 90       smoker   Diabetes Maternal Grandmother    Stroke Maternal Grandfather    Heart disease Paternal Grandfather    Thyroid  cancer Cousin        maternal   Breast cancer Cousin 62       maternal   Breast cancer Cousin 73   Colon cancer Cousin       Current Outpatient Medications:    Accu-Chek Softclix Lancets lancets, Use to check blood sugars in the morning, at noon, and at bedtime., Disp: 100 each, Rfl: 0   aspirin EC 81 MG tablet, Take by mouth., Disp: , Rfl:    b complex vitamins capsule, Take 1 capsule by mouth daily., Disp: , Rfl:    Blood Glucose Monitoring Suppl (BLOOD GLUCOSE MONITOR SYSTEM) w/Device KIT, Use to test blood sugars in the morning, at noon, and at bedtime., Disp: 1 kit, Rfl: 0   buPROPion  (WELLBUTRIN  XL) 150 MG 24 hr tablet, Take 1 tablet (150 mg total) by mouth daily. Take along a 300mg  tablet for a total daily dose of 450mg ., Disp: 90 tablet, Rfl: 3   buPROPion  (WELLBUTRIN  XL) 300 MG 24 hr tablet, Take 1 tablet (300 mg total) by mouth daily., Disp: 90 tablet, Rfl: 3   busPIRone  (BUSPAR ) 10 MG tablet, Take 1 tablet (10 mg total) by mouth 2 (two) times daily, Disp: 180 tablet, Rfl: 1   calcium carbonate (OSCAL) 1500 (600 Ca) MG TABS tablet, Take by mouth 2 (two) times daily with a meal., Disp: , Rfl:    capivasertib  (TRUQAP ) 200 MG tablet, Take 2 tablets (400 mg total) by mouth 2 (two) times daily. Take for 4 days, then hold for 3 days. Repeat every 7  days., Disp: 64 tablet, Rfl: 0   Cholecalciferol (VITAMIN D3) 10 MCG (400 UNIT) CAPS, Take by mouth., Disp: , Rfl:    citalopram  (CELEXA ) 40 MG tablet, Take 1 tablet (40 mg total) by mouth daily., Disp: 90 tablet, Rfl: 3   diphenhydrAMINE  (BENADRYL ) 25 MG tablet, Take 25 mg by mouth at bedtime as needed for sleep. Pt states daily now for sleep and allergies., Disp: , Rfl:    gabapentin  (NEURONTIN ) 100 MG capsule, Take 1 capsule (100 mg total) by mouth every morning AND 2 capsules (200 mg total) every evening., Disp: 270 capsule, Rfl: 1   Glucose Blood (BLOOD GLUCOSE TEST STRIPS) STRP, Use to test blood sugars in the morning, at noon, and at bedtime., Disp: 100 strip, Rfl: 0   HYDROmorphone  (DILAUDID ) 2 MG tablet, Take 0.5 tablets (1 mg total) by mouth every 6 (six)  hours as needed for severe pain (pain score 7-10)., Disp: 120 tablet, Rfl: 0   ketotifen  (ZADITOR ) 0.025 % ophthalmic solution, Place 1 drop into both eyes daily., Disp: , Rfl:    lisinopril  (ZESTRIL ) 10 MG tablet, Take 1 tablet (10 mg total) by mouth daily., Disp: 90 tablet, Rfl: 1   loperamide  (IMODIUM ) 2 MG capsule, Take 2 tabs by mouth with first loose stool, then 1 tab with each additional loose stool as needed. Do not exceed 8 tabs in a 24-hour period, Disp: 30 capsule, Rfl: 0   Magnesium Glycinate 100 MG CAPS, , Disp: , Rfl:    metFORMIN  (GLUCOPHAGE -XR) 500 MG 24 hr tablet, Take 1 tablet (500 mg total) by mouth 2 (two) times daily with a meal., Disp: 180 tablet, Rfl: 1   morphine  (MS CONTIN ) 30 MG 12 hr tablet, Take 1 tablet (30 mg total) by mouth every 12 (twelve) hours., Disp: 60 tablet, Rfl: 0   Multiple Vitamin (MULTIVITAMIN) tablet, Take 1 tablet by mouth daily., Disp: , Rfl:    omeprazole  (PRILOSEC) 20 MG capsule, Take 1 capsule (20 mg total) by mouth daily., Disp: 90 capsule, Rfl: 3   ondansetron  (ZOFRAN ) 4 MG tablet, Take 1 tablet (4 mg total) by mouth every 8 (eight) hours as needed for nausea or vomiting., Disp: 90 tablet, Rfl: 0   ondansetron  (ZOFRAN ) 8 MG tablet, Take 1 tablet (8 mg total) by mouth every 8 (eight) hours as needed for nausea or vomiting., Disp: 30 tablet, Rfl: 1   prochlorperazine  (COMPAZINE ) 10 MG tablet, Take 1 tablet (10 mg total) by mouth every 6 (six) hours as needed for nausea or vomiting., Disp: 30 tablet, Rfl: 1  Physical exam:  Vitals:   01/12/24 0851  BP: (!) 92/54  Pulse: 72  Resp: 19  Temp: (!) 97.1 F (36.2 C)  TempSrc: Tympanic  SpO2: 99%  Weight: 229 lb 1.6 oz (103.9 kg)  Height: 5' 3 (1.6 m)   Physical Exam Cardiovascular:     Rate and Rhythm: Normal rate and regular rhythm.     Heart sounds: Normal heart sounds.  Pulmonary:     Effort: Pulmonary effort is normal.     Breath sounds: Normal breath sounds.  Skin:    General: Skin is  warm and dry.  Neurological:     Mental Status: She is alert and oriented to person, place, and time.      I have personally reviewed labs listed below:    Latest Ref Rng & Units 01/12/2024    8:24 AM  CMP  Glucose 70 - 99 mg/dL 70 - 99 mg/dL 870  131   BUN 6 - 20 mg/dL 21   Creatinine 9.55 - 1.00 mg/dL 8.76   Sodium 864 - 854 mmol/L 137   Potassium 3.5 - 5.1 mmol/L 4.6   Chloride 98 - 111 mmol/L 105   CO2 22 - 32 mmol/L 25   Calcium 8.9 - 10.3 mg/dL 8.6   Total Protein 6.5 - 8.1 g/dL 6.7   Total Bilirubin 0.0 - 1.2 mg/dL 0.3   Alkaline Phos 38 - 126 U/L 108   AST 15 - 41 U/L 27   ALT 0 - 44 U/L 37       Latest Ref Rng & Units 01/12/2024    8:24 AM  CBC  WBC 4.0 - 10.5 K/uL 5.6   Hemoglobin 12.0 - 15.0 g/dL 89.5   Hematocrit 63.9 - 46.0 % 34.9   Platelets 150 - 400 K/uL 302      Assessment and plan- Patient is a 63 y.o. female with h/o metastatic ER positive HER2 negative breast cancer with bone only metastases here for a routine follow-up presently on Faslodex  plus capivasertib   Today is roughly day 10 since starting and she is tolerating it well without any hyperglycemia.  She will continue the progression of toxicity.Capivasertib  she will also continue with monthly Faslodex  at this time.  Tumor markers from today are pending.  I will see her again in about 6 weeks and she will see Alyson Leonhard on 717.  She has periodic fasting blood sugars ordered as per protocol.  She is currently awaiting second opinion appointment at [facility]  Bone metastases: She would be due for her next dose of Zometa  in September 2025   Visit Diagnosis 1. Primary malignant neoplasm of breast with metastasis (HCC)   2. High risk medication use   3. Use of fulvestrant  (Faslodex )      Dr. Annah Skene, MD, MPH [facility] [phone] 01/12/2024 11:53 AM

## 2024-01-13 ENCOUNTER — Other Ambulatory Visit: Payer: Self-pay

## 2024-01-13 LAB — CANCER ANTIGEN 27.29: CA 27.29: 399.3 U/mL — ABNORMAL HIGH (ref 0.0–38.6)

## 2024-01-16 ENCOUNTER — Encounter: Payer: Self-pay | Admitting: Cardiology

## 2024-01-16 NOTE — Telephone Encounter (Signed)
 I called the [facility] to followup on pt's referral. Referral coordinator reviewed the comments on the referral. [facility] approved the referral and breast ctr can not schedule the referral. The need the patient to call [facility] to schedule the apt at [phone]. I left a detailed vm for patient with this phone number. I will also send her a mychart msg.

## 2024-01-17 ENCOUNTER — Inpatient Hospital Stay

## 2024-01-17 DIAGNOSIS — Z5111 Encounter for antineoplastic chemotherapy: Secondary | ICD-10-CM | POA: Diagnosis not present

## 2024-01-17 DIAGNOSIS — C50919 Malignant neoplasm of unspecified site of unspecified female breast: Secondary | ICD-10-CM

## 2024-01-17 MED ORDER — FULVESTRANT 250 MG/5ML IM SOSY
500.0000 mg | PREFILLED_SYRINGE | Freq: Once | INTRAMUSCULAR | Status: AC
Start: 2024-01-17 — End: 2024-01-17
  Administered 2024-01-17: 500 mg via INTRAMUSCULAR
  Filled 2024-01-17: qty 10

## 2024-01-18 ENCOUNTER — Other Ambulatory Visit: Payer: Self-pay | Admitting: Obstetrics and Gynecology

## 2024-01-18 ENCOUNTER — Other Ambulatory Visit: Payer: Self-pay

## 2024-01-18 MED ORDER — NITROFURANTOIN MONOHYD MACRO 100 MG PO CAPS
100.0000 mg | ORAL_CAPSULE | Freq: Two times a day (BID) | ORAL | 0 refills | Status: AC
Start: 2024-01-18 — End: 2024-01-23

## 2024-01-18 NOTE — Progress Notes (Signed)
Rx macrobid for UTI sx.  

## 2024-01-20 ENCOUNTER — Other Ambulatory Visit: Payer: Self-pay

## 2024-01-20 ENCOUNTER — Other Ambulatory Visit: Payer: Self-pay | Admitting: Oncology

## 2024-01-20 DIAGNOSIS — C50919 Malignant neoplasm of unspecified site of unspecified female breast: Secondary | ICD-10-CM

## 2024-01-20 NOTE — Progress Notes (Signed)
 Specialty Pharmacy Refill Coordination Note  Jacqueline Deleon is a 63 y.o. female contacted today regarding refills of specialty medication(s) Capivasertib  (TRUQAP )   Patient requested Delivery   Delivery date: 01/27/24   Verified address: [address] [place] [address]   Medication will be filled on 01/26/24. This fill date is pending response to refill request from provider. Patient is aware and if they have not received fill by intended date they must follow up with pharmacy.

## 2024-01-20 NOTE — Progress Notes (Signed)
 Specialty Pharmacy Ongoing Clinical Assessment Note  Jacqueline Deleon is a 63 y.o. female who is being followed by the specialty pharmacy service for RxSp Oncology   Patient's specialty medication(s) reviewed today: Capivasertib  (TRUQAP )   Missed doses in the last 4 weeks: 1   Patient/Caregiver did not have any additional questions or concerns.   Therapeutic benefit summary: Unable to assess   Adverse events/side effects summary: Experienced adverse events/side effects (diarrhea, not controlled well with imodium . Advised pt to contact MD about trying lomotil)   Patient's therapy is appropriate to: Continue    Goals Addressed             This Visit's Progress    Slow Disease Progression       Patient is unable to be assessed as therapy was recently initiated. Patient will maintain adherence         Follow up: 3 months  [facility] Specialty Pharmacist

## 2024-01-23 ENCOUNTER — Encounter: Payer: Self-pay | Admitting: Oncology

## 2024-01-23 ENCOUNTER — Telehealth: Payer: Self-pay | Admitting: *Deleted

## 2024-01-23 ENCOUNTER — Other Ambulatory Visit: Payer: Self-pay

## 2024-01-23 ENCOUNTER — Other Ambulatory Visit

## 2024-01-23 MED ORDER — CAPIVASERTIB 200 MG PO TABS
400.0000 mg | ORAL_TABLET | Freq: Two times a day (BID) | ORAL | 0 refills | Status: DC
Start: 2024-01-23 — End: 2024-03-05
  Filled 2024-01-23: qty 64, 28d supply, fill #0

## 2024-01-23 NOTE — Telephone Encounter (Signed)
 They called to say to make sure that that she is not on orsedu and she will be on the on truqap .  The order for has been put in today.

## 2024-01-24 ENCOUNTER — Telehealth: Payer: Self-pay | Admitting: *Deleted

## 2024-01-24 NOTE — Telephone Encounter (Signed)
 [facility] calling about the orserdu  has been discontinued. And  Alyson  has ordered capivasertib . They now want why is she going to another medication

## 2024-01-25 NOTE — Progress Notes (Unsigned)
 Clinical Pharmacist Practitioner Clinic [facility]  [phone] [phone]  Patient Care Team: Diedra Lame, MD as PCP - General (Family Medicine) Cindie Ole DASEN, MD as PCP - Electrophysiology (Cardiology) Melanee Annah BROCKS, MD as Consulting Physician (Oncology)   Name of the patient: Jacqueline Deleon  [id number]  10-16-1960   Date of visit: 01/26/24  HPI: Patient is a 63 y.o. 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 Patient currently treated with started Truqap  (capivasertib ) and fulvestrant . She started capivasertib  on 01/03/24  Reason for Consult: Oral chemotherapy follow-up for capivasertib  therapy.   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   Chicken pox    Depression    Fatty liver    Fatty liver    Gastric polyps    GERD (gastroesophageal reflux disease)    Gestational diabetes    prediabetes out side of having baby   Headache    migraines   Heart murmur    History of abnormal mammogram 08/2006, 01/2008, 03/2009   Hypertension    IBS (irritable bowel syndrome)    Joint disorder    hx of left ankle tendonitis, bursitis, heel spur   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   Obesity 2018   BMI 45   Pelvic pain    adhesions and adenomyosis   Personal history of radiation therapy    Rosacea     HEMATOLOGY/ONCOLOGY HISTORY:  Oncology History  Primary malignant neoplasm of breast with metastasis (HCC)  08/19/2020 Cancer Staging   Staging form: Breast, AJCC  8th Edition - Clinical stage from 08/19/2020: Stage IV (rcTX, cNX, cM1, G3, ER+, PR+, HER2-) - Signed by Melanee Annah BROCKS, MD on 08/23/2020 Stage prefix: Recurrence   08/23/2020 Initial Diagnosis   Metastatic breast cancer (HCC)   08/23/2020 - 08/23/2020 Chemotherapy   Patient is on Treatment Plan : BREAST Palbociclib  / Letrozole  q28d     01/03/2024 -  Chemotherapy   Patient is on Treatment Plan : BREAST Abemaciclib  + Fulvestrant  q28d       ALLERGIES:  is allergic to kiwi extract, codeine, food, amoxicillin, erythromycin, sulfa  antibiotics, and tape.  MEDICATIONS:  Current Outpatient Medications  Medication Sig Dispense Refill   Accu-Chek Softclix Lancets lancets Use to check blood sugars in the morning, at noon, and at bedtime. 100 each 0   aspirin EC 81 MG tablet Take by mouth.     b complex vitamins capsule Take 1 capsule by mouth daily.     Blood Glucose Monitoring Suppl (BLOOD GLUCOSE MONITOR SYSTEM) w/Device KIT Use to test blood sugars in the morning, at noon, and at bedtime. 1 kit 0   buPROPion  (WELLBUTRIN  XL) 150 MG 24 hr tablet Take 1 tablet (150 mg total) by mouth daily. Take along a 300mg  tablet for a total daily dose of 450mg . 90 tablet 3   buPROPion  (WELLBUTRIN  XL) 300 MG 24 hr tablet Take 1 tablet (300 mg total) by mouth daily. 90 tablet 3  busPIRone  (BUSPAR ) 10 MG tablet Take 1 tablet (10 mg total) by mouth 2 (two) times daily 180 tablet 1   calcium carbonate (OSCAL) 1500 (600 Ca) MG TABS tablet Take by mouth 2 (two) times daily with a meal.     capivasertib  (TRUQAP ) 200 MG tablet Take 2 tablets (400 mg total) by mouth 2 (two) times daily. Take for 4 days, then hold for 3 days. Repeat every 7 days. 64 tablet 0   Cholecalciferol (VITAMIN D3) 10 MCG (400 UNIT) CAPS Take by mouth.     citalopram  (CELEXA ) 40 MG tablet Take 1 tablet (40 mg total) by mouth daily. 90 tablet 3   diphenhydrAMINE  (BENADRYL ) 25 MG tablet Take 25 mg by mouth at bedtime as needed for sleep. Pt states daily  now for sleep and allergies.     gabapentin  (NEURONTIN ) 100 MG capsule Take 1 capsule (100 mg total) by mouth every morning AND 2 capsules (200 mg total) every evening. 270 capsule 1   Glucose Blood (BLOOD GLUCOSE TEST STRIPS) STRP Use to test blood sugars in the morning, at noon, and at bedtime. 100 strip 0   HYDROmorphone  (DILAUDID ) 2 MG tablet Take 0.5 tablets (1 mg total) by mouth every 6 (six) hours as needed for severe pain (pain score 7-10). 120 tablet 0   ketotifen  (ZADITOR ) 0.025 % ophthalmic solution Place 1 drop into both eyes daily.     lisinopril  (ZESTRIL ) 10 MG tablet Take 1 tablet (10 mg total) by mouth daily. 90 tablet 1   loperamide  (IMODIUM ) 2 MG capsule Take 2 tabs by mouth with first loose stool, then 1 tab with each additional loose stool as needed. Do not exceed 8 tabs in a 24-hour period 30 capsule 0   Magnesium Glycinate 100 MG CAPS      metFORMIN  (GLUCOPHAGE -XR) 500 MG 24 hr tablet Take 1 tablet (500 mg total) by mouth 2 (two) times daily with a meal. 180 tablet 1   morphine  (MS CONTIN ) 30 MG 12 hr tablet Take 1 tablet (30 mg total) by mouth every 12 (twelve) hours. 60 tablet 0   Multiple Vitamin (MULTIVITAMIN) tablet Take 1 tablet by mouth daily.     omeprazole  (PRILOSEC) 20 MG capsule Take 1 capsule (20 mg total) by mouth daily. 90 capsule 3   ondansetron  (ZOFRAN ) 4 MG tablet Take 1 tablet (4 mg total) by mouth every 8 (eight) hours as needed for nausea or vomiting. 90 tablet 0   ondansetron  (ZOFRAN ) 8 MG tablet Take 1 tablet (8 mg total) by mouth every 8 (eight) hours as needed for nausea or vomiting. 30 tablet 1   prochlorperazine  (COMPAZINE ) 10 MG tablet Take 1 tablet (10 mg total) by mouth every 6 (six) hours as needed for nausea or vomiting. 30 tablet 1   No current facility-administered medications for this visit.    VITAL SIGNS: BP 101/60 (BP Location: Left Arm, Patient Position: Sitting, Cuff Size: Large)   Pulse 70   Temp 98.7 F (37.1 C) (Tympanic)   Resp  16   Wt 104.3 kg (229 lb 14.4 oz)   SpO2 98%   BMI 40.72 kg/m  Filed Weights   01/26/24 0843  Weight: 104.3 kg (229 lb 14.4 oz)    Estimated body mass index is 40.72 kg/m as calculated from the following:   Height as of 01/12/24: 5' 3 (1.6 m).   Weight as of this encounter: 104.3 kg (229 lb 14.4 oz).  LABS: CBC:    Component Value Date/Time  WBC 5.6 01/26/2024 0828   WBC 6.3 08/26/2023 0947   HGB 10.5 (L) 01/26/2024 0828   HGB 11.9 03/06/2020 0811   HCT 35.7 (L) 01/26/2024 0828   HCT 38.6 03/06/2020 0811   PLT 241 01/26/2024 0828   PLT 253 03/06/2020 0811   MCV 82.8 01/26/2024 0828   MCV 80 03/06/2020 0811   NEUTROABS 3.1 01/26/2024 0828   NEUTROABS 4.7 03/06/2020 0811   LYMPHSABS 1.6 01/26/2024 0828   LYMPHSABS 1.6 03/06/2020 0811   MONOABS 0.6 01/26/2024 0828   EOSABS 0.3 01/26/2024 0828   EOSABS 0.1 03/06/2020 0811   BASOSABS 0.0 01/26/2024 0828   BASOSABS 0.0 03/06/2020 0811   Comprehensive Metabolic Panel:    Component Value Date/Time   NA 139 01/26/2024 0827   NA 141 03/06/2020 0811   K 4.8 01/26/2024 0827   CL 106 01/26/2024 0827   CO2 25 01/26/2024 0827   BUN 18 01/26/2024 0827   BUN 18 03/06/2020 0811   CREATININE 1.18 (H) 01/26/2024 0827   GLUCOSE 94 01/26/2024 0828   CALCIUM 8.8 (L) 01/26/2024 0827   AST 25 01/26/2024 0827   ALT 22 01/26/2024 0827   ALKPHOS 128 (H) 01/26/2024 0827   BILITOT 0.4 01/26/2024 0827   PROT 6.3 (L) 01/26/2024 0827   PROT 6.6 03/06/2020 0811   ALBUMIN 3.2 (L) 01/26/2024 0827   ALBUMIN 4.2 03/06/2020 0811     Present during today's visit:   Assessment and Plan: CMP/CBC reviewed, continue capivasertib  400mg  twice daily 4on/7off Reviewed home glucose log reviewed, no concerns with current home readings   Oral Chemotherapy Side Effect/Intolerance:  Diarrhea: Patient reporting 4 or more loose stools per day despite use of loperamide . Rx for Lomotil will be sent in for patient. Encourage patient to continue hydration   Nausea: patient requiring occasional management, currently patient is using lemon-ginger drop and ondansetron  for management Mouth sores: she is using biotin for management, she knows to call and request Magic Mouthwash if her current mouthwash is not working No reported rash and fatigue unchanged  Oral Chemotherapy Adherence: Two doses missed since starting, patient reports she forgot to take them because she is getting used to the medication schedule No patient barriers to medication adherence identified.   New medications: None reported  Medication Access Issues: no issue, patient fills medication at [facility]  Patient expressed understanding and was in agreement with this plan. She also understands that She can call clinic at any time with any questions, concerns, or complaints.   Follow-up plan: RTC as scheduled  Thank you for allowing me to participate in the care of this very pleasant patient.   Time Total: 15 mins  Visit consisted of counseling and education on dealing with issues of symptom management in the setting of serious and potentially life-threatening illness.Greater than 50%  of this time was spent counseling and coordinating care related to the above assessment and plan.  Signed by: Marcellous Snarski N. Nadine Ryle, PharmD, NEILA, CPP Hematology/Oncology Clinical Pharmacist Practitioner [facility] [phone]  01/26/2024 3:53 PM

## 2024-01-26 ENCOUNTER — Inpatient Hospital Stay: Admitting: Pharmacist

## 2024-01-26 ENCOUNTER — Inpatient Hospital Stay

## 2024-01-26 ENCOUNTER — Other Ambulatory Visit: Payer: Self-pay

## 2024-01-26 ENCOUNTER — Other Ambulatory Visit: Payer: Self-pay | Admitting: Oncology

## 2024-01-26 VITALS — BP 101/60 | HR 70 | Temp 98.7°F | Resp 16 | Wt 229.9 lb

## 2024-01-26 DIAGNOSIS — Z5111 Encounter for antineoplastic chemotherapy: Secondary | ICD-10-CM | POA: Diagnosis not present

## 2024-01-26 DIAGNOSIS — C50919 Malignant neoplasm of unspecified site of unspecified female breast: Secondary | ICD-10-CM

## 2024-01-26 LAB — CBC WITH DIFFERENTIAL (CANCER CENTER ONLY)
Abs Immature Granulocytes: 0.02 K/uL (ref 0.00–0.07)
Basophils Absolute: 0 K/uL (ref 0.0–0.1)
Basophils Relative: 1 %
Eosinophils Absolute: 0.3 K/uL (ref 0.0–0.5)
Eosinophils Relative: 5 %
HCT: 35.7 % — ABNORMAL LOW (ref 36.0–46.0)
Hemoglobin: 10.5 g/dL — ABNORMAL LOW (ref 12.0–15.0)
Immature Granulocytes: 0 %
Lymphocytes Relative: 29 %
Lymphs Abs: 1.6 K/uL (ref 0.7–4.0)
MCH: 24.4 pg — ABNORMAL LOW (ref 26.0–34.0)
MCHC: 29.4 g/dL — ABNORMAL LOW (ref 30.0–36.0)
MCV: 82.8 fL (ref 80.0–100.0)
Monocytes Absolute: 0.6 K/uL (ref 0.1–1.0)
Monocytes Relative: 10 %
Neutro Abs: 3.1 K/uL (ref 1.7–7.7)
Neutrophils Relative %: 55 %
Platelet Count: 241 K/uL (ref 150–400)
RBC: 4.31 MIL/uL (ref 3.87–5.11)
RDW: 19.9 % — ABNORMAL HIGH (ref 11.5–15.5)
WBC Count: 5.6 K/uL (ref 4.0–10.5)
nRBC: 0 % (ref 0.0–0.2)

## 2024-01-26 LAB — CMP (CANCER CENTER ONLY)
ALT: 22 U/L (ref 0–44)
AST: 25 U/L (ref 15–41)
Albumin: 3.2 g/dL — ABNORMAL LOW (ref 3.5–5.0)
Alkaline Phosphatase: 128 U/L — ABNORMAL HIGH (ref 38–126)
Anion gap: 8 (ref 5–15)
BUN: 18 mg/dL (ref 8–23)
CO2: 25 mmol/L (ref 22–32)
Calcium: 8.8 mg/dL — ABNORMAL LOW (ref 8.9–10.3)
Chloride: 106 mmol/L (ref 98–111)
Creatinine: 1.18 mg/dL — ABNORMAL HIGH (ref 0.44–1.00)
GFR, Estimated: 53 mL/min — ABNORMAL LOW (ref >60)
Glucose, Bld: 97 mg/dL (ref 70–99)
Potassium: 4.8 mmol/L (ref 3.5–5.1)
Sodium: 139 mmol/L (ref 135–145)
Total Bilirubin: 0.4 mg/dL (ref 0.0–1.2)
Total Protein: 6.3 g/dL — ABNORMAL LOW (ref 6.5–8.1)

## 2024-01-26 LAB — GLUCOSE, RANDOM: Glucose, Bld: 94 mg/dL (ref 70–99)

## 2024-01-26 MED ORDER — BLOOD GLUCOSE TEST VI STRP
1.0000 | ORAL_STRIP | Freq: Three times a day (TID) | 0 refills | Status: AC
  Filled 2024-01-26: qty 100, 34d supply, fill #0

## 2024-01-27 ENCOUNTER — Other Ambulatory Visit: Payer: Self-pay

## 2024-01-27 ENCOUNTER — Other Ambulatory Visit (HOSPITAL_COMMUNITY): Payer: Self-pay

## 2024-01-27 ENCOUNTER — Other Ambulatory Visit: Payer: Self-pay | Admitting: *Deleted

## 2024-01-27 ENCOUNTER — Other Ambulatory Visit

## 2024-01-27 ENCOUNTER — Other Ambulatory Visit: Payer: Self-pay | Admitting: Internal Medicine

## 2024-01-27 DIAGNOSIS — C50919 Malignant neoplasm of unspecified site of unspecified female breast: Secondary | ICD-10-CM

## 2024-01-27 LAB — CANCER ANTIGEN 27.29: CA 27.29: 361.1 U/mL — ABNORMAL HIGH (ref 0.0–38.6)

## 2024-01-27 MED ORDER — DIPHENOXYLATE-ATROPINE 2.5-0.025 MG PO TABS: 2.0000 | ORAL_TABLET | Freq: Four times a day (QID) | ORAL | 0 refills | Status: DC | PRN

## 2024-01-27 MED ORDER — MORPHINE SULFATE ER 30 MG PO TBCR: 30.0000 mg | EXTENDED_RELEASE_TABLET | Freq: Two times a day (BID) | ORAL | 0 refills | Status: DC

## 2024-01-27 NOTE — Telephone Encounter (Signed)
 Closing encounter, lomotil sent in by Dr. Rennie. No further action needed.

## 2024-01-30 ENCOUNTER — Other Ambulatory Visit: Payer: Self-pay

## 2024-01-30 ENCOUNTER — Telehealth: Payer: Self-pay | Admitting: *Deleted

## 2024-01-30 DIAGNOSIS — R300 Dysuria: Secondary | ICD-10-CM

## 2024-01-30 NOTE — Telephone Encounter (Signed)
 I called [facility] in [place] they now have the amount that she needs and they will work on it and she can get it later in the day.  I called the patient let her know and she says she will pick it up.

## 2024-01-31 ENCOUNTER — Telehealth: Payer: Self-pay | Admitting: *Deleted

## 2024-01-31 ENCOUNTER — Inpatient Hospital Stay

## 2024-01-31 ENCOUNTER — Encounter: Payer: Self-pay | Admitting: Oncology

## 2024-01-31 DIAGNOSIS — C50919 Malignant neoplasm of unspecified site of unspecified female breast: Secondary | ICD-10-CM

## 2024-01-31 DIAGNOSIS — R300 Dysuria: Secondary | ICD-10-CM

## 2024-01-31 DIAGNOSIS — Z5111 Encounter for antineoplastic chemotherapy: Secondary | ICD-10-CM | POA: Diagnosis not present

## 2024-01-31 LAB — URINALYSIS, COMPLETE (UACMP) WITH MICROSCOPIC
Bilirubin Urine: NEGATIVE
Glucose, UA: NEGATIVE mg/dL
Hgb urine dipstick: NEGATIVE
Ketones, ur: NEGATIVE mg/dL
Nitrite: NEGATIVE
Protein, ur: 30 mg/dL — AB
Specific Gravity, Urine: 1.018 (ref 1.005–1.030)
WBC, UA: >50 WBC/hpf (ref 0–5)
pH: 7 (ref 5.0–8.0)

## 2024-01-31 MED ORDER — FULVESTRANT 250 MG/5ML IM SOSY
500.0000 mg | PREFILLED_SYRINGE | Freq: Once | INTRAMUSCULAR | Status: AC
Start: 2024-01-31 — End: 2024-01-31
  Administered 2024-01-31: 500 mg via INTRAMUSCULAR
  Filled 2024-01-31: qty 10

## 2024-01-31 NOTE — Telephone Encounter (Signed)
 Spoke to Gigi (30 minute hold time) and was warm transferred to the medical authorizations.  Transferred to [employer]. (Hold time 7 minutes) who proceeded to indicate that [employer] only covers behavioral health, not medical.

## 2024-01-31 NOTE — Telephone Encounter (Signed)
 Spoke with Eris who indicated the cpt code for a level 5 office visit is 99205.  Appointment for tomorrow would be cancelled but she is almost certain there is availability next week.  Also obtained direct fax number for Eris [phone].  Will follow up tomorrow after fax submitted.

## 2024-01-31 NOTE — Telephone Encounter (Signed)
 Fax sent to [facility] requesting STAT authorization; fax confirmation received.

## 2024-01-31 NOTE — Telephone Encounter (Signed)
 Area from [employer] that they are not in network with the patient's insurance.  Suggested that someone call about if she has about the benefits with her insurance or not, asked the insurance if they will pay for her to go over and have a second opinion about her cancer.

## 2024-01-31 NOTE — Telephone Encounter (Signed)
 Spoke to Khono B. Who transferred me to precertification.  Transferred to Dorian who indicated [employer] coverage ended 07/12/23.

## 2024-01-31 NOTE — Telephone Encounter (Signed)
 Outbound call, spoke to [place].  Per Jon Myron is the patient coordinator (listed as program specialist) as stated she contacted a rep at [employer] and the plan is not contracted; she is trying to get an emergency authorization as to not cancel her appointment scheduled for tomorrow.  Representative provided contact number for Eris [phone].

## 2024-01-31 NOTE — Telephone Encounter (Signed)
 Outbound call spoke to Montrell.  Montrell confirmed he does see in chart that patient was approved for [employer] and for patient to coordinate w/ local office to coordinate.  Montrell provided telephone number for [employer] [phone].

## 2024-01-31 NOTE — Telephone Encounter (Signed)
 Keisi called and said that she got a message that she supposed to be going to get a second opinion tomorrow but there is no off from insurance.  [facility] called right before her stating that the insurance she has is not in network and she would have to pay in less she gets an authorization from the insurance or if she has out of benefits with her insurance.  I sent Jereld can she help go through with insurance and see what they say because it is coming up tomorrow and the patient wants to go and she already got her husband off of work for tomorrow to.  I did tell Arin that it might be good for her to call about it herself to see what is going on.

## 2024-01-31 NOTE — Telephone Encounter (Signed)
 Spoke with [place] J at [place] who transferred me again.  Spoke to [place]; provided NPI's for referring provider as well as [facility] NPI# [id number].  [place] stated he does not see an authorization submitted on file.  Reiterated I'm trying to obtain a precertification / preauthorization for patient to be seen by [facility]; representative then transferred me again to Paige.  Paige provided fax # [phone].  Specified that she would send a fax detailing what would be needed for processing prior auth; if a stat is requested it's usually determined 24-72 hours.  If regular deliver can be up to 15 days.  Also would need CPT code that [facility] would use for OV; patient is scheduled to see Dominick Lorene Glenn, MD at [facility] at 1:30PM.  Awaiting fax.

## 2024-01-31 NOTE — Telephone Encounter (Addendum)
 Spoke to Eris who clarified patient was Approved to be seen but b/c her insurance is out of network with [facility] someone would need to call provider services (located on the back of the insurance card).  Details needed includes why the referring provider is recommending a second opinion at [facility] along with clinical OV notes.  Eris indicated this is not something [facility] would do; it is the responsibility of the referring facility and/or patient.  Also because patient has insurane, [facility] will not allow her to self pay.  If not shara is received by end of day by Eris she will cancel tomorrow's appointment based on what the insurance recommends.

## 2024-02-01 ENCOUNTER — Other Ambulatory Visit: Payer: Self-pay

## 2024-02-01 ENCOUNTER — Other Ambulatory Visit: Payer: Self-pay | Admitting: Nurse Practitioner

## 2024-02-01 MED ORDER — CEPHALEXIN 500 MG PO CAPS
500.0000 mg | ORAL_CAPSULE | Freq: Two times a day (BID) | ORAL | 0 refills | Status: AC
  Filled 2024-02-01: qty 14, 7d supply, fill #0

## 2024-02-01 NOTE — Telephone Encounter (Signed)
 Incoming call from Eris; patient is scheduled tentatively scheduled 8/6.  Eris did indicate that if another patient happens to cancel on 7/30 and the patient's authorization is approved, she will put her in that spot instead.  Informed will follow up Monday after a decision is reached.

## 2024-02-01 NOTE — Telephone Encounter (Signed)
 Spoke to Laine U at [place] who confirmed they did receive the authorization request and it is currently in process.  Turnaround time is by 7/25 at [facility] EST; should receive a fax with the decision details.  For reference Authorization ID / Request ID is [id number].

## 2024-02-01 NOTE — Progress Notes (Signed)
 Cephalexin  for UTI sent to pharmacy. Culture pending.

## 2024-02-01 NOTE — Telephone Encounter (Signed)
 Per patient my chart message Thank you. I have been scheduled for Aug. 6th.SABRA

## 2024-02-02 LAB — URINE CULTURE: Culture: >=100000 — AB

## 2024-02-03 ENCOUNTER — Other Ambulatory Visit: Payer: Self-pay

## 2024-02-03 NOTE — Telephone Encounter (Signed)
 Outbound call spoke to Lockhart at [place]; provided request ID# [facility].  Representative indicated authorization was denied.  Decision was just finalized about one hour ago; denial letter hasn't produced as of yet.  Rep indicated when letter is produced it will also provide appeal information. Was informed it can be put in as an urgent request for the appeal; will submit appeal upon receiving letter.

## 2024-02-06 ENCOUNTER — Encounter: Payer: Self-pay | Admitting: Oncology

## 2024-02-06 NOTE — Telephone Encounter (Signed)
 Outbound call spoke to patient, informed of below.  Informed would send a my chart to include the one provider's name listed as in network on the denial letter and will include contact to [employer] if patient would like to inquire if self pay is in option.  Advised patient to contact insurance company to obtain a list of in network providers and then patient can review with Dr. Melanee when she gets back in regards to any possible recommendations based on the list provided.  Patient verbalized understanding.

## 2024-02-06 NOTE — Telephone Encounter (Signed)
 Spoke with Jacqueline Deleon who was already aware of her denial; will proceed with cancelling appointments.  Jacqueline Deleon provided the number to [employer] [phone] to see if she wanted to have a discussion of whether or not she can be self pay.

## 2024-02-08 ENCOUNTER — Ambulatory Visit
Admission: RE | Admit: 2024-02-08 | Discharge: 2024-02-08 | Disposition: A | Discharge status: Home / Self Care | Source: Ambulatory Visit | Attending: Oncology | Admitting: Oncology

## 2024-02-08 ENCOUNTER — Ambulatory Visit
Admission: RE | Admit: 2024-02-08 | Discharge: 2024-02-08 | Disposition: A | Discharge status: Home / Self Care | Source: Ambulatory Visit | Attending: Oncology

## 2024-02-08 DIAGNOSIS — C50919 Malignant neoplasm of unspecified site of unspecified female breast: Secondary | ICD-10-CM | POA: Diagnosis present

## 2024-02-08 MED ORDER — NITROGLYCERIN 0.4 MG SL SUBL
SUBLINGUAL_TABLET | SUBLINGUAL | Status: AC
  Filled 2024-02-08: qty 1

## 2024-02-08 MED ORDER — TECHNETIUM TC 99M MEDRONATE IV KIT
20.0000 | PACK | Freq: Once | INTRAVENOUS | Status: AC | PRN
  Administered 2024-02-08: 22.57 via INTRAVENOUS

## 2024-02-08 MED ORDER — IOHEXOL 300 MG/ML  SOLN
100.0000 mL | Freq: Once | INTRAMUSCULAR | Status: AC | PRN
  Administered 2024-02-08: 100 mL via INTRAVENOUS

## 2024-02-10 ENCOUNTER — Other Ambulatory Visit: Payer: Self-pay

## 2024-02-10 ENCOUNTER — Inpatient Hospital Stay: Attending: Oncology

## 2024-02-10 DIAGNOSIS — C50919 Malignant neoplasm of unspecified site of unspecified female breast: Secondary | ICD-10-CM

## 2024-02-13 ENCOUNTER — Inpatient Hospital Stay: Attending: Oncology

## 2024-02-13 DIAGNOSIS — Z5111 Encounter for antineoplastic chemotherapy: Secondary | ICD-10-CM | POA: Diagnosis present

## 2024-02-13 DIAGNOSIS — G893 Neoplasm related pain (acute) (chronic): Secondary | ICD-10-CM | POA: Insufficient documentation

## 2024-02-13 DIAGNOSIS — C50611 Malignant neoplasm of axillary tail of right female breast: Secondary | ICD-10-CM | POA: Insufficient documentation

## 2024-02-13 DIAGNOSIS — C50919 Malignant neoplasm of unspecified site of unspecified female breast: Secondary | ICD-10-CM

## 2024-02-13 DIAGNOSIS — Z1501 Genetic susceptibility to malignant neoplasm of breast: Secondary | ICD-10-CM | POA: Diagnosis not present

## 2024-02-13 DIAGNOSIS — Z1732 Human epidermal growth factor receptor 2 negative status: Secondary | ICD-10-CM | POA: Insufficient documentation

## 2024-02-13 DIAGNOSIS — C7951 Secondary malignant neoplasm of bone: Secondary | ICD-10-CM | POA: Insufficient documentation

## 2024-02-13 DIAGNOSIS — Z170 Estrogen receptor positive status [ER+]: Secondary | ICD-10-CM | POA: Insufficient documentation

## 2024-02-13 LAB — CBC WITH DIFFERENTIAL (CANCER CENTER ONLY)
Abs Immature Granulocytes: 0.06 K/uL (ref 0.00–0.07)
Basophils Absolute: 0 K/uL (ref 0.0–0.1)
Basophils Relative: 0 %
Eosinophils Absolute: 0.1 K/uL (ref 0.0–0.5)
Eosinophils Relative: 2 %
HCT: 36.6 % (ref 36.0–46.0)
Hemoglobin: 11.3 g/dL — ABNORMAL LOW (ref 12.0–15.0)
Immature Granulocytes: 1 %
Lymphocytes Relative: 19 %
Lymphs Abs: 1.2 K/uL (ref 0.7–4.0)
MCH: 25.1 pg — ABNORMAL LOW (ref 26.0–34.0)
MCHC: 30.9 g/dL (ref 30.0–36.0)
MCV: 81.3 fL (ref 80.0–100.0)
Monocytes Absolute: 0.5 K/uL (ref 0.1–1.0)
Monocytes Relative: 8 %
Neutro Abs: 4.3 K/uL (ref 1.7–7.7)
Neutrophils Relative %: 70 %
Platelet Count: 267 K/uL (ref 150–400)
RBC: 4.5 MIL/uL (ref 3.87–5.11)
RDW: 20.3 % — ABNORMAL HIGH (ref 11.5–15.5)
WBC Count: 6.2 K/uL (ref 4.0–10.5)
nRBC: 0 % (ref 0.0–0.2)

## 2024-02-13 LAB — CMP (CANCER CENTER ONLY)
ALT: 27 U/L (ref 0–44)
AST: 28 U/L (ref 15–41)
Albumin: 3.7 g/dL (ref 3.5–5.0)
Alkaline Phosphatase: 140 U/L — ABNORMAL HIGH (ref 38–126)
Anion gap: 9 (ref 5–15)
BUN: 11 mg/dL (ref 8–23)
CO2: 22 mmol/L (ref 22–32)
Calcium: 8.9 mg/dL (ref 8.9–10.3)
Chloride: 105 mmol/L (ref 98–111)
Creatinine: 1.26 mg/dL — ABNORMAL HIGH (ref 0.44–1.00)
GFR, Estimated: 49 mL/min — ABNORMAL LOW (ref >60)
Glucose, Bld: 130 mg/dL — ABNORMAL HIGH (ref 70–99)
Potassium: 4.1 mmol/L (ref 3.5–5.1)
Sodium: 136 mmol/L (ref 135–145)
Total Bilirubin: 0.5 mg/dL (ref 0.0–1.2)
Total Protein: 7 g/dL (ref 6.5–8.1)

## 2024-02-17 ENCOUNTER — Telehealth: Payer: Self-pay | Admitting: *Deleted

## 2024-02-17 NOTE — Telephone Encounter (Signed)
 I called back to the patient and let her know that she can come on 02/24/24 and we are going to cancel the 02/28/24  I spoke to her and she knows just to come in at 8/15 .  She sees Dr. Melanee on the 15th she will give her new appointments after that.  She is good with that.

## 2024-02-20 ENCOUNTER — Other Ambulatory Visit: Payer: Self-pay | Admitting: Gastroenterology

## 2024-02-20 ENCOUNTER — Other Ambulatory Visit: Payer: Self-pay

## 2024-02-24 ENCOUNTER — Inpatient Hospital Stay: Admitting: Oncology

## 2024-02-24 ENCOUNTER — Encounter: Payer: Self-pay | Admitting: Oncology

## 2024-02-24 ENCOUNTER — Inpatient Hospital Stay

## 2024-02-24 ENCOUNTER — Other Ambulatory Visit

## 2024-02-24 ENCOUNTER — Ambulatory Visit: Admitting: Oncology

## 2024-02-24 VITALS — BP 108/64 | HR 100 | Temp 97.8°F | Resp 18 | Ht 63.0 in | Wt 210.0 lb

## 2024-02-24 DIAGNOSIS — C50919 Malignant neoplasm of unspecified site of unspecified female breast: Secondary | ICD-10-CM | POA: Diagnosis not present

## 2024-02-24 DIAGNOSIS — Z79899 Other long term (current) drug therapy: Secondary | ICD-10-CM | POA: Diagnosis not present

## 2024-02-24 DIAGNOSIS — Z5111 Encounter for antineoplastic chemotherapy: Secondary | ICD-10-CM | POA: Diagnosis not present

## 2024-02-24 DIAGNOSIS — Z79818 Long term (current) use of other agents affecting estrogen receptors and estrogen levels: Secondary | ICD-10-CM

## 2024-02-24 DIAGNOSIS — N179 Acute kidney failure, unspecified: Secondary | ICD-10-CM

## 2024-02-24 DIAGNOSIS — G893 Neoplasm related pain (acute) (chronic): Secondary | ICD-10-CM

## 2024-02-24 LAB — CMP (CANCER CENTER ONLY)
ALT: 44 U/L (ref 0–44)
AST: 28 U/L (ref 15–41)
Albumin: 3.8 g/dL (ref 3.5–5.0)
Alkaline Phosphatase: 182 U/L — ABNORMAL HIGH (ref 38–126)
Anion gap: 13 (ref 5–15)
BUN: 24 mg/dL — ABNORMAL HIGH (ref 8–23)
CO2: 18 mmol/L — ABNORMAL LOW (ref 22–32)
Calcium: 9.4 mg/dL (ref 8.9–10.3)
Chloride: 108 mmol/L (ref 98–111)
Creatinine: 2.04 mg/dL — ABNORMAL HIGH (ref 0.44–1.00)
GFR, Estimated: 27 mL/min — ABNORMAL LOW (ref >60)
Glucose, Bld: 175 mg/dL — ABNORMAL HIGH (ref 70–99)
Potassium: 4.9 mmol/L (ref 3.5–5.1)
Sodium: 139 mmol/L (ref 135–145)
Total Bilirubin: 0.6 mg/dL (ref 0.0–1.2)
Total Protein: 7.8 g/dL (ref 6.5–8.1)

## 2024-02-24 LAB — CBC WITH DIFFERENTIAL (CANCER CENTER ONLY)
Abs Immature Granulocytes: 0.06 K/uL (ref 0.00–0.07)
Basophils Absolute: 0 K/uL (ref 0.0–0.1)
Basophils Relative: 1 %
Eosinophils Absolute: 0.2 K/uL (ref 0.0–0.5)
Eosinophils Relative: 2 %
HCT: 41.1 % (ref 36.0–46.0)
Hemoglobin: 12.3 g/dL (ref 12.0–15.0)
Immature Granulocytes: 1 %
Lymphocytes Relative: 17 %
Lymphs Abs: 1.4 K/uL (ref 0.7–4.0)
MCH: 24.7 pg — ABNORMAL LOW (ref 26.0–34.0)
MCHC: 29.9 g/dL — ABNORMAL LOW (ref 30.0–36.0)
MCV: 82.7 fL (ref 80.0–100.0)
Monocytes Absolute: 0.6 K/uL (ref 0.1–1.0)
Monocytes Relative: 7 %
Neutro Abs: 6.3 K/uL (ref 1.7–7.7)
Neutrophils Relative %: 72 %
Platelet Count: 372 K/uL (ref 150–400)
RBC: 4.97 MIL/uL (ref 3.87–5.11)
RDW: 20.2 % — ABNORMAL HIGH (ref 11.5–15.5)
WBC Count: 8.6 K/uL (ref 4.0–10.5)
nRBC: 0 % (ref 0.0–0.2)

## 2024-02-24 LAB — HEMOGLOBIN A1C
Hgb A1c MFr Bld: 6.1 % — ABNORMAL HIGH (ref 4.8–5.6)
Mean Plasma Glucose: 128.37 mg/dL

## 2024-02-24 LAB — GLUCOSE, RANDOM: Glucose, Bld: 176 mg/dL — ABNORMAL HIGH (ref 70–99)

## 2024-02-24 MED ORDER — SODIUM CHLORIDE 0.9 % IV SOLN
Freq: Once | INTRAVENOUS | Status: AC
  Filled 2024-02-24: qty 250

## 2024-02-24 MED ORDER — FULVESTRANT 250 MG/5ML IM SOSY
500.0000 mg | PREFILLED_SYRINGE | Freq: Once | INTRAMUSCULAR | Status: AC
Start: 2024-02-24 — End: 2024-02-24
  Administered 2024-02-24: 500 mg via INTRAMUSCULAR
  Filled 2024-02-24: qty 10

## 2024-02-24 NOTE — Progress Notes (Signed)
 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  It was a 1.4 cm tumor with negative lymph nodes grade 1 and patient went on to get lumpectomy followed by adjuvant radiation therapy.  Oncotype DX score was 6 and she did not require adjuvant chemotherapy.  She was on tamoxifen for 5 years.  She had BRCA testing done at that time which was negative.   Patient felt a lump in her right axilla in September 2021.  She underwent ultrasound of bilateral breasts which did not pick up any axillary mass.  A prior screening mammogram in September 2021 was unremarkable.  She was then admitted for Covid pneumonia and underwent CT angio chest on 07/22/2020 which incidentally picked up an axillary mass measuring 2.5 cm.  No obvious breast mass.  Axillary mass was biopsied and was consistent with high-grade invasive mammary carcinoma.  IHC was positive for GATA3 with diffuse strong nuclear staining.  There was no lymph node architecture identified in the sample and it could not be determined if it is a primary tumor within the axillary breast tissue or metastases.  ER strongly positive greater than 90%, PR 1 to 10% positive and HER-2 negative- 0   PET CT scan showed hypermetabolic poorly marginated solid right axillary mass 2.5 x 2 cm with an SUV of 5.6.  No  enlarged hypermetabolic mediastinal or hilar lymph nodes no enlarged left axillary lymph nodes.  Subcentimeter lung nodules below PET resolution.  Small hypermetabolism in the right liver with an SUV of 6 without CT correlate equivocal for liver metastases.  Multiple hypermetabolic faintly lytic osseous lesions throughout the lumbar spine and bilateral pelvic girdle with representative supra-acetabular right iliac bone 1.9 cm lesion, anterior superior left acetabular 1.7 cm lesion and L1 1.2 cm lesion.   NGS testing showed no actionable mutations.  PD-L1 less than 1%.  CHEK2 S422 FS, PIK3CA N1068 FS, PIK3CA N345H, CCN E1 gain, ERB B2 1 P-CSF 1R fusion, FGF 19 gain, FGF 3 gain, FGF 4gain.   Ibrance  plus letrozole  started in February 2022.    Patient noted to have increase in CA 27-29 from 26 and November 2023 203 in July 2024.  CT chest abdomen pelvis with contrast and bone scan showed worsening metastatic disease in the bones especially in the thoracolumbar spine and right hemipelvis.    Peripheral blood NGS testing showed CHEK2 mutation, PIK3CA P.N1068 FS, PIK3CA P.N345H, PTEN P.R173S, ESR 1P.D538G, ESR 1P.L536R, PIK3CA P.R88Q.  MSI high not detected  Elacestrant  started in sept 2024.  Disease progression noted in June 2025 and given the presence of PIK3CA mutation patient switched capivasertib  plus fulvestrant      Interval history-patient has had a poor quality of life onto.  She usually gets 3 to 4 days of diarrhea every week.  She is trying to keep up with her oral fluid intake.  She has ongoing fatigue.  ECOG PS-  1 Pain scale- 4 Opioid associated constipation- no  Review of systems- Review of Systems  Constitutional:  Positive for malaise/fatigue. Negative for chills, fever and weight loss.  HENT:  Negative for congestion, ear discharge and nosebleeds.   Eyes:  Negative for blurred vision.  Respiratory:  Negative for cough, hemoptysis, sputum production, shortness of breath and wheezing.    Cardiovascular:  Negative for chest pain, palpitations, orthopnea and claudication.  Gastrointestinal:  Positive for diarrhea. Negative for abdominal pain, blood in stool, constipation, heartburn, melena, nausea and vomiting.  Genitourinary:  Negative for dysuria, flank pain, frequency, hematuria and urgency.  Musculoskeletal:  Negative for back pain, joint pain and myalgias.  Skin:  Negative for rash.  Neurological:  Negative for dizziness, tingling, focal weakness, seizures, weakness and headaches.  Endo/Heme/Allergies:  Does not bruise/bleed easily.  Psychiatric/Behavioral:  Negative for depression and suicidal ideas. The patient does not have insomnia.       Allergies  Allergen Reactions   Kiwi Extract Itching and Swelling    The real kiwi fruit   Codeine Other (See Comments)    Felt funny.   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   Chicken pox    Depression    Fatty liver    Fatty liver    Gastric polyps    GERD (gastroesophageal reflux disease)    Gestational diabetes    prediabetes out side of having baby   Headache    migraines   Heart murmur    History of abnormal mammogram 08/2006, 01/2008, 03/2009   Hypertension    IBS (irritable bowel syndrome)    Joint disorder    hx of left ankle tendonitis, bursitis, heel spur   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 Smith/ laprascopic surgery   COLONOSCOPY  04/04/2000   COLONOSCOPY WITH PROPOFOL  N/A 02/12/2019   Procedure: COLONOSCOPY WITH PROPOFOL ;  Surgeon: Gaylyn Gladis PENNER, MD;  Location[facility] ENDOSCOPY;  Service: Endoscopy;  Laterality: N/A;   ESOPHAGOGASTRODUODENOSCOPY (EGD) WITH PROPOFOL  N/A 05/01/2015   Procedure: ESOPHAGOGASTRODUODENOSCOPY (EGD) WITH PROPOFOL ;  Surgeon: Deward CINDERELLA Piedmont, MD;  Location: [facility] ENDOSCOPY;  Service: Gastroenterology;  Laterality: N/A;   ESOPHAGOGASTRODUODENOSCOPY (EGD) WITH PROPOFOL  N/A 02/12/2019   Procedure: ESOPHAGOGASTRODUODENOSCOPY (EGD) WITH PROPOFOL ;  Surgeon: Gaylyn Gladis PENNER, MD;  Location: [facility] ENDOSCOPY;  Service: Endoscopy;  Laterality: N/A;   EYE SURGERY  08/2012   laser surgery on retina/ DR Appenzeller   FINGER SURGERY     repair of tendon in right index, [facility]   FOOT SURGERY     Dr. Verta   IRRIGATION AND DEBRIDEMENT SEBACEOUS CYST     right upper back   LAPAROSCOPIC SUPRACERVICAL HYSTERECTOMY  06/2007   Dr Kincius/ adhesions/CPP/adenomyosis   MASTECTOMY PARTIAL / LUMPECTOMY Right 01/2008   with sentinal lymph node biopsy/ infiltrating ductal cancer   REPAIR PERONEAL TENDONS ANKLE  10/2019   with tarsal exostectomy and sural neuroma excision on right    SKIN CANCER EXCISION     back and calves   WISDOM TOOTH EXTRACTION  1991    Social History   Socioeconomic History   Marital status:  Married    Spouse name: Marcey   Number of children: 1   Years of education: 14   Highest education level: Not on file  Occupational History   Occupation: CMA  Tobacco Use   Smoking status: Never   Smokeless tobacco: Never  Vaping Use   Vaping status: Never Used  Substance and Sexual Activity   Alcohol use: Yes    Alcohol/week: 0.0 standard drinks of alcohol    Comment: rare, 1-2 drinks per year   Drug use: No   Sexual activity: Yes    Partners: Male    Birth control/protection: Surgical    Comment: Hysterectomy   Other Topics Concern   Not on file  Social History Narrative   Lives in [place] with husband, no pets.  Works at [employer].      Diet - regular   Exercise - occasional   Social Drivers of Health   Financial Resource Strain: Low Risk  (01/16/2024)   Received from [facility]   Overall Financial Resource Strain (CARDIA)    Difficulty of Paying Living Expenses: Not hard at all  Food Insecurity: No Food Insecurity (01/16/2024)   Received from [facility]   Hunger Vital Sign    Within the past 12 months, you worried that your food would run out before you got the money to buy more.: Never true    Within the past 12 months, the food you bought just didn't last and you didn't have money to get more.: Never true  Transportation Needs: No Transportation Needs (01/16/2024)   Received from [facility] - Transportation    In the past 12 months, has lack of transportation kept you from medical appointments or from getting medications?: No    Lack of Transportation (Non-Medical): No  Physical Activity: Insufficiently Active (07/27/2017)   Exercise Vital Sign    Days of Exercise per Week: 4 days    Minutes of Exercise per Session: 30 min  Stress: No Stress Concern Present (07/27/2017)   [employer] of Occupational Health - Occupational Stress Questionnaire    Feeling of Stress : Only a little  Social  Connections: Socially Integrated (07/27/2017)   Social Connection and Isolation Panel    Frequency of Communication with Friends and Family: More than three times a week    Frequency of Social Gatherings with Friends and Family: Once a week    Attends  Religious Services: More than 4 times per year    Active Member of Clubs or Organizations: Yes    Attends Club or Organization Meetings: More than 4 times per year    Marital Status: Married  Catering manager Violence: Not At Risk (07/27/2017)   Humiliation, Afraid, Rape, and Kick questionnaire    Fear of Current or Ex-Partner: No    Emotionally Abused: No    Physically Abused: No    Sexually Abused: No    Family History  Problem Relation Age of Onset   Hypertension Mother    Thyroid  disease Mother    Breast cancer Mother 37   Colon cancer Mother 27       again at 47   Atrial fibrillation Mother    Hip fracture Mother    Skin cancer Mother    Stroke Father    Hypertension Father    Cancer Father 32       prostate   Parkinson's disease Father    Skin cancer Father    Atrial fibrillation Sister        Pacemaker inserted end of Sept-beginning of Oct. 2024   Hypertension Sister    Thyroid  disease Sister    Cancer Sister        skin/ squamous cell   Heart Problems Sister    Diabetes Maternal Aunt    Cancer Maternal Aunt    Breast cancer Maternal Aunt 75   Lymphoma Maternal Uncle    Heart disease Maternal Uncle    Melanoma Maternal Uncle 80   Lung cancer Paternal Aunt 73       smoker   Diabetes Maternal Grandmother    Stroke Maternal Grandfather    Heart disease Paternal Grandfather    Thyroid  cancer Cousin        maternal   Breast cancer Cousin 60       maternal   Breast cancer Cousin 19   Colon cancer Cousin      Current Outpatient Medications:    aspirin EC 81 MG tablet, Take by mouth., Disp: , Rfl:    b complex vitamins capsule, Take 1 capsule by mouth daily., Disp: , Rfl:    Blood Glucose Monitoring Suppl  (BLOOD GLUCOSE MONITOR SYSTEM) w/Device KIT, Use to test blood sugars in the morning, at noon, and at bedtime., Disp: 1 kit, Rfl: 0   buPROPion  (WELLBUTRIN  XL) 150 MG 24 hr tablet, Take 1 tablet (150 mg total) by mouth daily. Take along a 300mg  tablet for a total daily dose of 450mg ., Disp: 90 tablet, Rfl: 3   buPROPion  (WELLBUTRIN  XL) 300 MG 24 hr tablet, Take 1 tablet (300 mg total) by mouth daily., Disp: 90 tablet, Rfl: 3   busPIRone  (BUSPAR ) 10 MG tablet, Take 1 tablet (10 mg total) by mouth 2 (two) times daily, Disp: 180 tablet, Rfl: 1   calcium carbonate (OSCAL) 1500 (600 Ca) MG TABS tablet, Take by mouth 2 (two) times daily with a meal., Disp: , Rfl:    capivasertib  (TRUQAP ) 200 MG tablet, Take 2 tablets (400 mg total) by mouth 2 (two) times daily. Take for 4 days, then hold for 3 days. Repeat every 7 days., Disp: 64 tablet, Rfl: 0   Cholecalciferol (VITAMIN D3) 10 MCG (400 UNIT) CAPS, Take by mouth., Disp: , Rfl:    citalopram  (CELEXA ) 40 MG tablet, Take 1 tablet (40 mg total) by mouth daily., Disp: 90 tablet, Rfl: 3   diphenhydrAMINE  (BENADRYL ) 25 MG tablet, Take 25 mg by  mouth at bedtime as needed for sleep. Pt states daily now for sleep and allergies., Disp: , Rfl:    diphenoxylate -atropine  (LOMOTIL ) 2.5-0.025 MG tablet, Take 2 tablets by mouth 4 (four) times daily as needed for diarrhea or loose stools., Disp: 60 tablet, Rfl: 0   gabapentin  (NEURONTIN ) 100 MG capsule, Take 1 capsule (100 mg total) by mouth every morning AND 2 capsules (200 mg total) every evening., Disp: 270 capsule, Rfl: 1   Glucose Blood (BLOOD GLUCOSE TEST STRIPS) STRP, Use to test blood sugars in the morning, at noon, and at bedtime., Disp: 100 strip, Rfl: 0   HYDROmorphone  (DILAUDID ) 2 MG tablet, Take 0.5 tablets (1 mg total) by mouth every 6 (six) hours as needed for severe pain (pain score 7-10)., Disp: 120 tablet, Rfl: 0   ketotifen  (ZADITOR ) 0.025 % ophthalmic solution, Place 1 drop into both eyes daily., Disp: ,  Rfl:    loperamide  (IMODIUM ) 2 MG capsule, Take 2 tabs by mouth with first loose stool, then 1 tab with each additional loose stool as needed. Do not exceed 8 tabs in a 24-hour period, Disp: 30 capsule, Rfl: 0   Magnesium Glycinate 100 MG CAPS, , Disp: , Rfl:    metFORMIN  (GLUCOPHAGE -XR) 500 MG 24 hr tablet, Take 1 tablet (500 mg total) by mouth 2 (two) times daily with a meal., Disp: 180 tablet, Rfl: 1   morphine  (MS CONTIN ) 30 MG 12 hr tablet, Take 1 tablet (30 mg total) by mouth every 12 (twelve) hours., Disp: 60 tablet, Rfl: 0   Multiple Vitamin (MULTIVITAMIN) tablet, Take 1 tablet by mouth daily., Disp: , Rfl:    omeprazole  (PRILOSEC) 20 MG capsule, Take 1 capsule (20 mg total) by mouth daily., Disp: 90 capsule, Rfl: 3   ondansetron  (ZOFRAN ) 4 MG tablet, Take 1 tablet (4 mg total) by mouth every 8 (eight) hours as needed for nausea or vomiting., Disp: 90 tablet, Rfl: 0   ondansetron  (ZOFRAN ) 8 MG tablet, Take 1 tablet (8 mg total) by mouth every 8 (eight) hours as needed for nausea or vomiting., Disp: 30 tablet, Rfl: 1   prochlorperazine  (COMPAZINE ) 10 MG tablet, Take 1 tablet (10 mg total) by mouth every 6 (six) hours as needed for nausea or vomiting., Disp: 30 tablet, Rfl: 1  Physical exam:  Vitals:   02/24/24 0840  BP: 108/64  Pulse: 100  Resp: 18  Temp: 97.8 F (36.6 C)  TempSrc: Tympanic  SpO2: 100%  Weight: 210 lb (95.3 kg)  Height: 5' 3 (1.6 m)   Physical Exam Cardiovascular:     Rate and Rhythm: Normal rate and regular rhythm.     Heart sounds: Normal heart sounds.  Pulmonary:     Effort: Pulmonary effort is normal.     Breath sounds: Normal breath sounds.  Skin:    General: Skin is warm and dry.  Neurological:     Mental Status: She is alert and oriented to person, place, and time.      I have personally reviewed labs listed below:    Latest Ref Rng & Units 02/24/2024    7:59 AM  CMP  Glucose 70 - 99 mg/dL 70 - 99 mg/dL 824    823   BUN 8 - 23 mg/dL 24    Creatinine 9.55 - 1.00 mg/dL 7.95   Sodium 864 - 854 mmol/L 139   Potassium 3.5 - 5.1 mmol/L 4.9   Chloride 98 - 111 mmol/L 108   CO2 22 - 32 mmol/L 18  Calcium 8.9 - 10.3 mg/dL 9.4   Total Protein 6.5 - 8.1 g/dL 7.8   Total Bilirubin 0.0 - 1.2 mg/dL 0.6   Alkaline Phos 38 - 126 U/L 182   AST 15 - 41 U/L 28   ALT 0 - 44 U/L 44       Latest Ref Rng & Units 02/24/2024    7:59 AM  CBC  WBC 4.0 - 10.5 K/uL 8.6   Hemoglobin 12.0 - 15.0 g/dL 87.6   Hematocrit 63.9 - 46.0 % 41.1   Platelets 150 - 400 K/uL 372    I have personally reviewed Radiology images listed below: No images are attached to the encounter.  CT CHEST ABDOMEN PELVIS W CONTRAST Result Date: 02/15/2024 CLINICAL DATA:  Metastatic breast cancer.  * Tracking Code: BO * EXAM: CT CHEST, ABDOMEN, AND PELVIS WITH CONTRAST TECHNIQUE: Multidetector CT imaging of the chest, abdomen and pelvis was performed following the standard protocol during bolus administration of intravenous contrast. RADIATION DOSE REDUCTION: This exam was performed according to the departmental dose-optimization program which includes automated exposure control, adjustment of the mA and/or kV according to patient size and/or use of iterative reconstruction technique. CONTRAST:  OMNIPAQUE  IOHEXOL  300 MG/ML  SOLN COMPARISON:  Multiple priors including CT Nov 24, 2023 and same day nuclear medicine bone scan FINDINGS: CT CHEST FINDINGS Cardiovascular: Normal caliber thoracic aorta. Normal size heart. No significant pericardial effusion/thickening. Mediastinum/Nodes: No suspicious thyroid  nodule. No pathologically enlarged mediastinal, hilar or axillary lymph nodes. Esophagus is grossly unremarkable. Lungs/Pleura: A few scattered tiny pulmonary nodules are stable from prior examination for instance a 6 mm subpleural pulmonary nodule in the right lower lobe on image 48/4. No new suspicious pulmonary nodules or masses. Scattered atelectasis/scarring. Musculoskeletal:  Increased sclerosis in the sclerotic/mixed lytic and sclerotic osseous metastasis throughout the axial and proximal appendicular skeleton. No new suspicious osseous lesion identified. CT ABDOMEN PELVIS FINDINGS Hepatobiliary: New ill-defined hypodensity in the left lobe of the liver measures 9 mm on image 41/2. gallbladder surgically absent. Similar prominence of the biliary tree favored reservoir effect post cholecystectomy. Pancreas: No pancreatic ductal dilation or evidence of acute inflammation. Spleen: No splenomegaly. Adrenals/Urinary Tract: No suspicious adrenal nodule/mass. No hydronephrosis. 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 Urinary bladder is unremarkable for degree of distension. Stomach/Bowel: Stomach is unremarkable for degree of distension. No pathologic dilation of small or large bowel. Colonic stool burden compatible with constipation. Colonic diverticulosis without findings of acute diverticulitis. Vascular/Lymphatic: Normal caliber abdominal aorta. Smooth IVC contours. The portal, splenic and superior mesenteric veins are patent. No pathologically enlarged abdominal or pelvic lymph nodes. Reproductive: Status post hysterectomy. No adnexal masses. Other: No significant abdominopelvic free fluid. Musculoskeletal: Increased sclerosis in the sclerotic/mixed lytic and sclerotic osseous metastasis throughout the axial and proximal appendicular skeleton. No definite new osseous metastasis identified. IMPRESSION: 1. Increased sclerosis in the sclerotic/mixed lytic and sclerotic osseous metastasis throughout the axial and proximal appendicular skeleton, which is nonspecific but favored to reflect posttreatment change. 2. New ill-defined hypodensity in the left lobe of the liver measures 9 mm, nonspecific but suspicious for metastatic disease. Consider more definitive characterization by hepatic protocol MRI with and without contrast. 3. Stable scattered tiny pulmonary nodules. 4. Colonic  diverticulosis without findings of acute diverticulitis. 5. Colonic stool burden compatible with constipation. Electronically Signed   By: Reyes Holder M.D.   On: 02/15/2024 16:11   NM Bone Scan Whole Body Result Date: 02/10/2024 CLINICAL DATA:  History of metastatic right breast cancer  EXAM: NUCLEAR MEDICINE WHOLE BODY BONE SCAN TECHNIQUE: Whole body anterior and posterior images were obtained approximately 3 hours after intravenous injection of radiopharmaceutical. RADIOPHARMACEUTICALS:  22.57 mCi Technetium-10m MDP IV COMPARISON:  11/24/2023 FINDINGS: Anterior and posterior whole body planar images are obtained after radiotracer administration. Physiologic excretion of radiotracer is seen within the kidneys and bladder. Widespread areas of increased radiotracer uptake are again seen throughout the axial and appendicular skeleton, most pronounced within the shoulders, thoracolumbar spine, thoracic cage, sacrum, right hemipelvis, and inter trochanteric right femur. Several lesions demonstrate increase in intensity of radiotracer uptake since prior study, most prominently within the T10 vertebral body, proximal right femur, and right upper lateral thoracic cage. Likely degenerative activity seen within the acromioclavicular joints, bilateral knees, and bilateral ankle/hind feet. IMPRESSION: 1. Widespread metastatic disease within the axial and appendicular skeleton, with increased intensity of radiotracer uptake within previous lesions seen within the right upper lateral thoracic cage, T10 vertebral body, and intertrochanteric proximal right femur. Electronically Signed   By: Ozell Daring M.D.   On: 02/10/2024 20:52     Assessment and plan- Patient is a 63 y.o. female with history of metastatic breast cancer ER positive PR positive HER2 negative with bone only metastases presently on Faslodex  plus capivasertib  here for routine follow-up and discuss CT scan results and further management  I have CT chest  abdomen pelvis images independently and discussed findings with the patient.  Have also reviewed bone scan findings and discussed findings with the patient.  Bone scan shows some increase in intensity of the existing bone lesions but no new bone lesions.  CT chest abdomen and pelvisWith contrast did not show any evidence of new metastatic disease but she was found to have a 9 mm hypodensity in the left lobe of the liver.  I will plan to get an MRI liver with and without contrast to characterize this further.  Plan is to continue with Faslodex  plus Truqap  at this time.  She will get Faslodex  injection today and continue monthly injections.  Patient found to have AKI today with a serum creatinine of 2.  I am therefore asking her to hold trueqap for 1 week.  Repeat BMP again in 1 week's time and given her ongoing diarrhea and fatigue I am inclined to reduce the dose to 320 mg from 400 mg 4 days on 3 days off.  She has not had any worsening hypoglycemia on this medication so far.  Neoplasm related pain: Continue MS Contin  30 mg every 12 hours along with Dilaudid  as needed  I will see her back in 3 weeks with repeat labs and MRI prior.   Visit Diagnosis 1. Primary malignant neoplasm of breast with metastasis (HCC)   2. AKI (acute kidney injury) (HCC)   3. High risk medication use   4. Use of fulvestrant  (Faslodex )      Dr. Annah Skene, MD, MPH [facility] [phone] 02/24/2024 12:47 PM

## 2024-02-24 NOTE — Progress Notes (Signed)
 Patient states that just about every day of the week since starting her treatment, she feels horrible, nausea, diarrhea, fatigue, ect. She sees that her cancer numbers are improving so she doesn't want to stop or change treatment, what if she could maybe reduce the dosage? She feels like she might need some fluids today. Her handicap parking pass expires next month so I filled out another form, just got to get the doctor's signature and return it to the patient.

## 2024-02-24 NOTE — Patient Instructions (Addendum)
 [facility] - A DEPT OF [facility]  Discharge Instructions: Thank you for choosing [facility] to provide your oncology and hematology care.  If you have a lab appointment with the Cancer Center, please go directly to the Cancer Center and check in at the registration area.  Wear comfortable clothing and clothing appropriate for easy access to any Portacath or PICC line.   We strive to give you quality time with your provider. You may need to reschedule your appointment if you arrive late (15 or more minutes).  Arriving late affects you and other patients whose appointments are after yours.  Also, if you miss three or more appointments without notifying the office, you may be dismissed from the clinic at the provider's discretion.      For prescription refill requests, have your pharmacy contact our office and allow 72 hours for refills to be completed.    Today you received the following chemotherapy and/or immunotherapy agents FASLODEX  and 1 LITER FLUID      To help prevent nausea and vomiting after your treatment, we encourage you to take your nausea medication as directed.  BELOW ARE SYMPTOMS THAT SHOULD BE REPORTED IMMEDIATELY: *FEVER GREATER THAN 100.4 F (38 C) OR HIGHER *CHILLS OR SWEATING *NAUSEA AND VOMITING THAT IS NOT CONTROLLED WITH YOUR NAUSEA MEDICATION *UNUSUAL SHORTNESS OF BREATH *UNUSUAL BRUISING OR BLEEDING *URINARY PROBLEMS (pain or burning when urinating, or frequent urination) *BOWEL PROBLEMS (unusual diarrhea, constipation, pain near the anus) TENDERNESS IN MOUTH AND THROAT WITH OR WITHOUT PRESENCE OF ULCERS (sore throat, sores in mouth, or a toothache) UNUSUAL RASH, SWELLING OR PAIN  UNUSUAL VAGINAL DISCHARGE OR ITCHING   Items with * indicate a potential emergency and should be followed up as soon as possible or go to the Emergency Department if any problems should occur.  Please show the CHEMOTHERAPY ALERT CARD or  IMMUNOTHERAPY ALERT CARD at check-in to the Emergency Department and triage nurse.  Should you have questions after your visit or need to cancel or reschedule your appointment, please contact [facility] - A DEPT OF [facility]  [phone] and follow the prompts.  Office hours are 8:00 a.m. to 4:30 p.m. Monday - Friday. Please note that voicemails left after 4:00 p.m. may not be returned until the following business day.  We are closed weekends and major holidays. You have access to a nurse at all times for urgent questions. Please call the main number to the clinic [phone] and follow the prompts.  For any non-urgent questions, you may also contact your provider using MyChart. We now offer e-Visits for anyone 35 and older to request care online for non-urgent symptoms. For details visit [email or web address].   Also download the MyChart app! Go to the app store, search MyChart, open the app, select [place], and log in with your MyChart username and password.      Dehydration, Adult Dehydration is a condition in which there is not enough water or other fluids in the body. This happens when a person loses more fluids than they take in. Important organs cannot work right without the right amount of fluids. Any loss of fluids from the body can cause dehydration. Dehydration can be mild, worse, or very bad. It should be treated right away to keep it from getting very bad. What are the causes? Conditions that cause loss of water in the body. They include: Watery poop (  diarrhea). Vomiting. Sweating a lot. Fever. Infection. Peeing (urinating) a lot. Not drinking enough fluids. Certain medicines, such as medicines that take extra fluid out of the body (diuretics). Lack of safe drinking water. Not being able to get enough water and food. What increases the risk? Having a long-term (chronic) illness that has not been treated the right way, such  as: Diabetes. Heart disease. Kidney disease. Being 5 years of age or older. Having a disability. Living in a place that is high above the ground or sea (high in altitude). The thinner, drier air causes more fluid loss. Doing exercises that put stress on your body for a long time. Being active when in hot places. What are the signs or symptoms? Symptoms of dehydration depend on how bad it is. Mild or worse dehydration Thirst. Dry lips or dry mouth. Feeling dizzy or light-headed. Muscle cramps. Passing little pee or dark pee. Pee may be the color of tea. Headache. Very bad dehydration Changes in skin. Skin may: Be cold to the touch (clammy). Be blotchy or pale. Not go back to normal right after you pinch it and let it go. Little or no tears, pee, or sweat. Fast breathing. Low blood pressure. Weak pulse. Pulse that is more than 100 beats a minute when you are sitting still. Other changes, such as: Feeling very thirsty. Eyes that look hollow (sunken). Cold hands and feet. Being confused. Being very tired (lethargic) or having trouble waking from sleep. Losing weight. Loss of consciousness. How is this treated? Treatment for this condition depends on how bad your dehydration is. Treatment should start right away. Do not wait until your condition gets very bad. Very bad dehydration is an emergency. You will need to go to a hospital. Mild or worse dehydration can be treated at home. You may be asked to: Drink more fluids. Drink an oral rehydration solution (ORS). This drink gives you the right amount of fluids, salts, and minerals (electrolytes). Very bad dehydration can be treated: With fluids through an IV tube. By correcting low levels of electrolytes in the body. By treating the problem that caused your dehydration. Follow these instructions at home: Oral rehydration solution If told by your doctor, drink an ORS: Make an ORS. Use instructions on the package. Start by  drinking small amounts, about  cup (120 mL) every 5-10 minutes. Slowly drink more until you have had the amount that your doctor said to have.  Eating and drinking  Drink enough clear fluid to keep your pee pale yellow. If you were told to drink an ORS, finish the ORS first. Then, start slowly drinking other clear fluids. Drink fluids such as: Water. Do not drink only water. Doing that can make the salt (sodium) level in your body get too low. Water from ice chips you suck on. Fruit juice that you have added water to (diluted). Low-calorie sports drinks. Eat foods that have the right amounts of salts and minerals, such as bananas, oranges, potatoes, tomatoes, or spinach. Do not drink alcohol. Avoid drinks that have caffeine  or sugar. These include:: High-calorie sports drinks. Fruit juice that you did not add water to. Soda. Coffee or energy drinks. Avoid foods that are greasy or have a lot of fat or sugar. General instructions Take over-the-counter and prescription medicines only as told by your doctor. Do not take sodium tablets. Doing that can make the salt level in your body get too high. Return to your normal activities as told by your doctor. Ask your  doctor what activities are safe for you. Keep all follow-up visits. Your doctor may check and change your treatment. Contact a doctor if: You have pain in your belly (abdomen) and the pain: Gets worse. Stays in one place. You have a rash. You have a stiff neck. You get angry or annoyed more easily than normal. You are more tired or have a harder time waking than normal. You feel weak or dizzy. You feel very thirsty. Get help right away if: You have any symptoms of very bad dehydration. You vomit every time you eat or drink. Your vomiting gets worse, does not go away, or you vomit blood or green stuff. You are getting treatment, but symptoms are getting worse. You have a fever. You have a very bad headache. You  have: Diarrhea that gets worse or does not go away. Blood in your poop (stool). This may cause poop to look black and tarry. No pee in 6-8 hours. Only a small amount of pee in 6-8 hours, and the pee is very dark. You have trouble breathing. These symptoms may be an emergency. Get help right away. Call 911. Do not wait to see if the symptoms will go away. Do not drive yourself to the hospital. This information is not intended to replace advice given to you by your health care provider. Make sure you discuss any questions you have with your health care provider. Document Revised: 01/25/2022 Document Reviewed: 01/25/2022 Elsevier Patient Education  2024 Elsevier Inc.   Fulvestrant  Injection What is this medication? FULVESTRANT  (ful VES trant) treats breast cancer. It works by blocking the hormone estrogen in breast tissue, which prevents breast cancer cells from spreading or growing. This medicine may be used for other purposes; ask your health care provider or pharmacist if you have questions. COMMON BRAND NAME(S): FASLODEX  What should I tell my care team before I take this medication? They need to know if you have any of these conditions: Bleeding disorder Liver disease Low blood cell levels (white cells, red cells, and platelets) An unusual or allergic reaction to fulvestrant , other medications, foods, dyes, or preservatives Pregnant or trying to get pregnant Breastfeeding How should I use this medication? This medication is injected into a muscle. It is given by your care team in a hospital or clinic setting. Talk to your care team about the use of this medication in children. Special care may be needed. Overdosage: If you think you have taken too much of this medicine contact a poison control center or emergency room at once. NOTE: This medicine is only for you. Do not share this medicine with others. What if I miss a dose? Keep appointments for follow-up doses. It is important  not to miss your dose. Call your care team if you are unable to keep an appointment. What may interact with this medication? Fluoroestradiol F18 This list may not describe all possible interactions. Give your health care provider a list of all the medicines, herbs, non-prescription drugs, or dietary supplements you use. Also tell them if you smoke, drink alcohol, or use illegal drugs. Some items may interact with your medicine. What should I watch for while using this medication? Your condition will be monitored carefully while you are receiving this medication. You may need blood work done while you are taking this medication. This medication is injected into a muscle. Talk to your care team if you also take medications that prevent or treat blood clots, such as warfarin. Blood thinners may increase the risk  of bleeding or bruising in the muscle where this medication is injected. The benefits of this medication may outweigh the risks. Your care team can help you find the option that works for you. They can also help limit the risk of bleeding. Talk to your care team if you may be pregnant. Serious birth defects can occur if you take this medication during pregnancy and for 1 year after the last dose. You will need a negative pregnancy test before starting this medication. Contraception is recommended while taking this medication and for 1 year after the last dose. Your care team can help you find the option that works for you. Do not breastfeed while taking this medication and for 1 year after the last dose. This medication may cause infertility. Talk to your care team if you are concerned about your fertility. What side effects may I notice from receiving this medication? Side effects that you should report to your care team as soon as possible: Allergic reactions or angioedema--skin rash, itching or hives, swelling of the face, eyes, lips, tongue, arms, or legs, trouble swallowing or breathing Pain,  tingling, or numbness in the hands or feet Side effects that usually do not require medical attention (report to your care team if they continue or are bothersome): Bone, joint, or muscle pain Constipation Headache Hot flashes Nausea Pain, redness, or irritation at injection site Unusual weakness or fatigue This list may not describe all possible side effects. Call your doctor for medical advice about side effects. You may report side effects to FDA at 1-800-FDA-1088. Where should I keep my medication? This medication is given in a hospital or clinic. It will not be stored at home. NOTE: This sheet is a summary. It may not cover all possible information. If you have questions about this medicine, talk to your doctor, pharmacist, or health care provider.  2024 Elsevier/Gold Standard (2023-03-04 00:00:00)

## 2024-02-25 LAB — CANCER ANTIGEN 27.29: CA 27.29: 330 U/mL — ABNORMAL HIGH (ref 0.0–38.6)

## 2024-02-26 ENCOUNTER — Other Ambulatory Visit: Payer: Self-pay

## 2024-02-28 ENCOUNTER — Ambulatory Visit: Payer: Self-pay | Admitting: Oncology

## 2024-02-28 ENCOUNTER — Ambulatory Visit

## 2024-02-28 ENCOUNTER — Inpatient Hospital Stay

## 2024-02-28 ENCOUNTER — Ambulatory Visit: Admitting: Oncology

## 2024-02-28 ENCOUNTER — Encounter: Payer: Self-pay | Admitting: Oncology

## 2024-02-28 ENCOUNTER — Other Ambulatory Visit: Payer: Self-pay | Admitting: Oncology

## 2024-02-28 ENCOUNTER — Other Ambulatory Visit

## 2024-02-28 VITALS — BP 120/70 | HR 74 | Temp 96.7°F | Resp 16

## 2024-02-28 DIAGNOSIS — Z5111 Encounter for antineoplastic chemotherapy: Secondary | ICD-10-CM | POA: Diagnosis not present

## 2024-02-28 DIAGNOSIS — C50919 Malignant neoplasm of unspecified site of unspecified female breast: Secondary | ICD-10-CM

## 2024-02-28 DIAGNOSIS — N179 Acute kidney failure, unspecified: Secondary | ICD-10-CM

## 2024-02-28 LAB — BASIC METABOLIC PANEL - CANCER CENTER ONLY
Anion gap: 10 (ref 5–15)
BUN: 15 mg/dL (ref 8–23)
CO2: 18 mmol/L — ABNORMAL LOW (ref 22–32)
Calcium: 8.7 mg/dL — ABNORMAL LOW (ref 8.9–10.3)
Chloride: 108 mmol/L (ref 98–111)
Creatinine: 1.37 mg/dL — ABNORMAL HIGH (ref 0.44–1.00)
GFR, Estimated: 44 mL/min — ABNORMAL LOW (ref >60)
Glucose, Bld: 96 mg/dL (ref 70–99)
Potassium: 4.1 mmol/L (ref 3.5–5.1)
Sodium: 136 mmol/L (ref 135–145)

## 2024-02-28 LAB — CBC WITH DIFFERENTIAL (CANCER CENTER ONLY)
Abs Immature Granulocytes: 0.04 K/uL (ref 0.00–0.07)
Basophils Absolute: 0 K/uL (ref 0.0–0.1)
Basophils Relative: 0 %
Eosinophils Absolute: 0.2 K/uL (ref 0.0–0.5)
Eosinophils Relative: 4 %
HCT: 37.2 % (ref 36.0–46.0)
Hemoglobin: 11.3 g/dL — ABNORMAL LOW (ref 12.0–15.0)
Immature Granulocytes: 1 %
Lymphocytes Relative: 29 %
Lymphs Abs: 1.4 K/uL (ref 0.7–4.0)
MCH: 24.8 pg — ABNORMAL LOW (ref 26.0–34.0)
MCHC: 30.4 g/dL (ref 30.0–36.0)
MCV: 81.8 fL (ref 80.0–100.0)
Monocytes Absolute: 0.5 K/uL (ref 0.1–1.0)
Monocytes Relative: 10 %
Neutro Abs: 2.7 K/uL (ref 1.7–7.7)
Neutrophils Relative %: 56 %
Platelet Count: 296 K/uL (ref 150–400)
RBC: 4.55 MIL/uL (ref 3.87–5.11)
RDW: 20.5 % — ABNORMAL HIGH (ref 11.5–15.5)
WBC Count: 4.8 K/uL (ref 4.0–10.5)
nRBC: 0 % (ref 0.0–0.2)

## 2024-02-28 MED ORDER — SODIUM CHLORIDE 0.9 % IV SOLN
Freq: Once | INTRAVENOUS | Status: AC
  Filled 2024-02-28: qty 250

## 2024-02-28 NOTE — Progress Notes (Signed)
 Pt to receive NS 1000 ml over an hour per Dr Melanee.

## 2024-02-28 NOTE — Telephone Encounter (Signed)
-----   Message from Annah JAYSON Skene sent at 02/28/2024  9:40 AM EDT ----- Please let her know that her serum creatinine is better at 1.3.  We will continue to hold her Truqap  for 1 more week and then restart her at a lower dose 320 mg BID ----- Message ----- From: Interface, Lab In [place] Sent: 02/28/2024   9:29 AM EDT To: Annah JAYSON Skene, MD

## 2024-02-28 NOTE — Telephone Encounter (Signed)
 Per Dr. Melanee Please let her know that her serum creatinine is better at 1.3. We will continue to hold her Truqap  for 1 more week and then restart her at a lower dose 320 mg BID.  Outbound call; detailed voice message left indicating the above.  Will follow with my chart message as well.

## 2024-02-29 ENCOUNTER — Other Ambulatory Visit: Payer: Self-pay

## 2024-02-29 LAB — CANCER ANTIGEN 27.29: CA 27.29: 375 U/mL — ABNORMAL HIGH (ref 0.0–38.6)

## 2024-03-01 ENCOUNTER — Other Ambulatory Visit: Payer: Self-pay

## 2024-03-02 ENCOUNTER — Ambulatory Visit
Admission: RE | Admit: 2024-03-02 | Discharge: 2024-03-02 | Disposition: A | Discharge status: Home / Self Care | Source: Ambulatory Visit | Attending: Oncology | Admitting: Oncology

## 2024-03-02 ENCOUNTER — Encounter (INDEPENDENT_AMBULATORY_CARE_PROVIDER_SITE_OTHER): Payer: Self-pay

## 2024-03-02 ENCOUNTER — Other Ambulatory Visit: Payer: Self-pay | Admitting: Oncology

## 2024-03-02 ENCOUNTER — Encounter: Payer: Self-pay | Admitting: Oncology

## 2024-03-02 ENCOUNTER — Other Ambulatory Visit: Payer: Self-pay

## 2024-03-02 ENCOUNTER — Telehealth: Payer: Self-pay | Admitting: *Deleted

## 2024-03-02 DIAGNOSIS — C50919 Malignant neoplasm of unspecified site of unspecified female breast: Secondary | ICD-10-CM | POA: Diagnosis present

## 2024-03-02 MED ORDER — GADOBUTROL 1 MMOL/ML IV SOLN
9.0000 mL | Freq: Once | INTRAVENOUS | Status: AC | PRN
  Administered 2024-03-02: 9 mL via INTRAVENOUS

## 2024-03-02 MED ORDER — MORPHINE SULFATE ER 30 MG PO TBCR: 30.0000 mg | EXTENDED_RELEASE_TABLET | Freq: Two times a day (BID) | ORAL | 0 refills | Status: DC

## 2024-03-02 NOTE — Telephone Encounter (Signed)
 Encounter opened in error

## 2024-03-05 ENCOUNTER — Other Ambulatory Visit: Payer: Self-pay

## 2024-03-05 ENCOUNTER — Other Ambulatory Visit: Payer: Self-pay | Admitting: Oncology

## 2024-03-05 ENCOUNTER — Other Ambulatory Visit: Payer: Self-pay | Admitting: Pharmacist

## 2024-03-05 DIAGNOSIS — C50919 Malignant neoplasm of unspecified site of unspecified female breast: Secondary | ICD-10-CM

## 2024-03-05 MED ORDER — CAPIVASERTIB 160 MG PO TBPK
320.0000 mg | ORAL_TABLET | Freq: Two times a day (BID) | ORAL | 0 refills | Status: DC
  Filled 2024-03-05: qty 64, 28d supply, fill #0

## 2024-03-05 NOTE — Progress Notes (Signed)
 Specialty Pharmacy Refill Coordination Note  Jacqueline Deleon is a 63 y.o. female contacted today regarding refills of specialty medication(s) Capivasertib  (TRUQAP )   Patient requested Delivery   Delivery date: 03/07/24   Verified address: [address] [place] [address]   Medication will be filled on 03/06/24, pending refill approval.

## 2024-03-05 NOTE — Telephone Encounter (Signed)
 Per lab note from MD, MD would like to dose reduce patient to Truqap  320mg  bid, rx sent to pharmacy for patient for the new dose.

## 2024-03-05 NOTE — Telephone Encounter (Signed)
 Refill already sent in for new reduced dose

## 2024-03-06 ENCOUNTER — Other Ambulatory Visit (HOSPITAL_COMMUNITY): Payer: Self-pay

## 2024-03-06 ENCOUNTER — Telehealth: Payer: Self-pay

## 2024-03-06 MED ORDER — HYDROMORPHONE HCL 2 MG PO TABS
1.0000 mg | ORAL_TABLET | Freq: Four times a day (QID) | ORAL | 0 refills | Status: DC | PRN
  Filled 2024-03-06: qty 120, 60d supply, fill #0
  Filled 2024-03-06: qty 60, 30d supply, fill #0

## 2024-03-06 NOTE — Telephone Encounter (Signed)
 Getting josh involved

## 2024-03-06 NOTE — Progress Notes (Signed)
 Jacqueline Ester PARAS, MD sent to [facility] PROCEDURE / BIOPSY REVIEW Date: 03/06/24  Requested Biopsy site: Liver mass Reason for request: Metastatic breast ca Imaging review: Best seen on MRI 03/02/24  Decision: Approved Imaging modality to perform: Ultrasound Schedule with: Moderate Sedation Schedule for: Any VIR  Additional comments: @VIR : multiple small masses, largest and best target likely in left lobe (see series 4 image 21, series 11, image 15)  Please contact me with questions, concerns, or if issue pertaining to this request arise.  [name], MD [facility]

## 2024-03-06 NOTE — Telephone Encounter (Signed)
 Patient in need of US  guided biopsy of liver. Per Jacqueline Deleon, I can put her on Thurs 9/4 at 9a an arrive at 8a. She is on ASA 81mg  and she will need to hold that for 5 days so her last dose would be Friday 8/29. Let me know. Thanks.  Reiterated numerous time to take Aspirin on Friday and then hold until at least after the procedure is done; reviewed NPO after midnight, would need a driver, and [facility].  Patient verbalized understanding.

## 2024-03-06 NOTE — Telephone Encounter (Signed)
 Also informed Dr. Melanee and Mr. Sherleen that I informed her Mr. Sherleen will follow up with her in regards to managing her pain / symptoms.

## 2024-03-07 ENCOUNTER — Encounter: Payer: Self-pay | Admitting: Nurse Practitioner

## 2024-03-07 ENCOUNTER — Ambulatory Visit
Admission: RE | Admit: 2024-03-07 | Discharge: 2024-03-07 | Disposition: A | Discharge status: Home / Self Care | Source: Ambulatory Visit | Attending: Nurse Practitioner

## 2024-03-07 ENCOUNTER — Inpatient Hospital Stay

## 2024-03-07 ENCOUNTER — Telehealth: Admitting: Hospice and Palliative Medicine

## 2024-03-07 ENCOUNTER — Ambulatory Visit
Admission: RE | Admit: 2024-03-07 | Discharge: 2024-03-07 | Disposition: A | Discharge status: Home / Self Care | Source: Ambulatory Visit | Attending: Nurse Practitioner | Admitting: Nurse Practitioner

## 2024-03-07 ENCOUNTER — Other Ambulatory Visit: Payer: Self-pay | Admitting: *Deleted

## 2024-03-07 ENCOUNTER — Ambulatory Visit (HOSPITAL_BASED_OUTPATIENT_CLINIC_OR_DEPARTMENT_OTHER): Admitting: Nurse Practitioner

## 2024-03-07 ENCOUNTER — Telehealth: Payer: Self-pay | Admitting: *Deleted

## 2024-03-07 VITALS — BP 104/44 | HR 93 | Temp 98.7°F | Resp 16 | Wt 219.0 lb

## 2024-03-07 DIAGNOSIS — C50919 Malignant neoplasm of unspecified site of unspecified female breast: Secondary | ICD-10-CM

## 2024-03-07 DIAGNOSIS — C7951 Secondary malignant neoplasm of bone: Secondary | ICD-10-CM | POA: Diagnosis not present

## 2024-03-07 DIAGNOSIS — G893 Neoplasm related pain (acute) (chronic): Secondary | ICD-10-CM | POA: Diagnosis not present

## 2024-03-07 DIAGNOSIS — R42 Dizziness and giddiness: Secondary | ICD-10-CM

## 2024-03-07 DIAGNOSIS — M25551 Pain in right hip: Secondary | ICD-10-CM | POA: Insufficient documentation

## 2024-03-07 DIAGNOSIS — R531 Weakness: Secondary | ICD-10-CM

## 2024-03-07 DIAGNOSIS — Z5111 Encounter for antineoplastic chemotherapy: Secondary | ICD-10-CM | POA: Diagnosis not present

## 2024-03-07 LAB — CMP (CANCER CENTER ONLY)
ALT: 25 U/L (ref 0–44)
AST: 35 U/L (ref 15–41)
Albumin: 3.2 g/dL — ABNORMAL LOW (ref 3.5–5.0)
Alkaline Phosphatase: 133 U/L — ABNORMAL HIGH (ref 38–126)
Anion gap: 13 (ref 5–15)
BUN: 11 mg/dL (ref 8–23)
CO2: 20 mmol/L — ABNORMAL LOW (ref 22–32)
Calcium: 8.7 mg/dL — ABNORMAL LOW (ref 8.9–10.3)
Chloride: 104 mmol/L (ref 98–111)
Creatinine: 1.11 mg/dL — ABNORMAL HIGH (ref 0.44–1.00)
GFR, Estimated: 57 mL/min — ABNORMAL LOW (ref >60)
Glucose, Bld: 116 mg/dL — ABNORMAL HIGH (ref 70–99)
Potassium: 4.5 mmol/L (ref 3.5–5.1)
Sodium: 137 mmol/L (ref 135–145)
Total Bilirubin: 0.5 mg/dL (ref 0.0–1.2)
Total Protein: 6.5 g/dL (ref 6.5–8.1)

## 2024-03-07 LAB — CBC WITH DIFFERENTIAL (CANCER CENTER ONLY)
Abs Immature Granulocytes: 0.04 K/uL (ref 0.00–0.07)
Basophils Absolute: 0 K/uL (ref 0.0–0.1)
Basophils Relative: 0 %
Eosinophils Absolute: 0.3 K/uL (ref 0.0–0.5)
Eosinophils Relative: 3 %
HCT: 34.4 % — ABNORMAL LOW (ref 36.0–46.0)
Hemoglobin: 10.5 g/dL — ABNORMAL LOW (ref 12.0–15.0)
Immature Granulocytes: 1 %
Lymphocytes Relative: 20 %
Lymphs Abs: 1.5 K/uL (ref 0.7–4.0)
MCH: 25.3 pg — ABNORMAL LOW (ref 26.0–34.0)
MCHC: 30.5 g/dL (ref 30.0–36.0)
MCV: 82.9 fL (ref 80.0–100.0)
Monocytes Absolute: 0.9 K/uL (ref 0.1–1.0)
Monocytes Relative: 12 %
Neutro Abs: 5 K/uL (ref 1.7–7.7)
Neutrophils Relative %: 64 %
Platelet Count: 247 K/uL (ref 150–400)
RBC: 4.15 MIL/uL (ref 3.87–5.11)
RDW: 19.9 % — ABNORMAL HIGH (ref 11.5–15.5)
WBC Count: 7.8 K/uL (ref 4.0–10.5)
nRBC: 0 % (ref 0.0–0.2)

## 2024-03-07 MED ORDER — MORPHINE SULFATE ER 30 MG PO TBCR: 30.0000 mg | EXTENDED_RELEASE_TABLET | Freq: Three times a day (TID) | ORAL | Status: DC

## 2024-03-07 MED ORDER — SODIUM CHLORIDE 0.9 % IV SOLN
INTRAVENOUS | Status: DC
  Filled 2024-03-07 (×2): qty 250

## 2024-03-07 MED ORDER — MORPHINE SULFATE (PF) 2 MG/ML IV SOLN
4.0000 mg | Freq: Once | INTRAVENOUS | Status: AC
  Administered 2024-03-07: 4 mg via INTRAVENOUS
  Filled 2024-03-07: qty 2

## 2024-03-07 MED ORDER — HYDROMORPHONE HCL 2 MG PO TABS: 4.0000 mg | ORAL_TABLET | Freq: Four times a day (QID) | ORAL | Status: DC | PRN

## 2024-03-07 NOTE — Progress Notes (Signed)
 Symptom Management Clinic  [facility] at [facility] A Department of the [place]. [facility] [address] [place], [place] [address] [phone] (phone) [phone] (fax)  Patient Care Team: Diedra Lame, MD as PCP - General (Family Medicine) Cindie Ole DASEN, MD as PCP - Electrophysiology (Cardiology) Melanee Annah BROCKS, MD as Consulting Physician (Oncology)   Name of the patient: Jacqueline Deleon  [id number]  [birth year]   Date of visit: 03/07/24  Diagnosis- Metastatic Breast Cancer  Chief complaint/ Reason for visit- hip pain  Heme/Onc history:  Oncology History  Primary malignant neoplasm of breast with metastasis (HCC)  08/19/2020 Cancer Staging   Staging form: Breast, AJCC 8th Edition - Clinical stage from 08/19/2020: Stage IV (rcTX, cNX, cM1, G3, ER+, PR+, HER2-) - Signed by Melanee Annah BROCKS, MD on 08/23/2020 Stage prefix: Recurrence   08/23/2020 Initial Diagnosis   Metastatic breast cancer (HCC)   08/23/2020 - 08/23/2020 Chemotherapy   Patient is on Treatment Plan : BREAST Palbociclib  / Letrozole  q28d     01/03/2024 -  Chemotherapy   Patient is on Treatment Plan : BREAST Abemaciclib  + Fulvestrant  q28d       Interval History- Jacqueline Deleon is a 63 y.o. female diagnosed with metastatic breast cancer who presents to symptom management clinic for complaints of right hip pain. Pain has been gradually worsening. Uncomfortable to walk, change positions. Interfering with sleep. Radiates to midline back and down her leg. Worse when weight bearing. Unrelieved by ms contin  30 mg BID and dilaudid  4 mg q6h for breakthrough. Last BM a few days ago. She has held stool softeners after suffering diarrhea last week. Eating and drinking normally. Complains of dizziness, like the walls are moving. Changing position does not improve or worsen symptoms. No pre-syncope. No nausea, vomiting, or vision changes. She has not received new dose of truqap . No  headaches or falls. Previously had radiation to right hip  ECOG FS: 1-2  Review of systems- Review of Systems  Constitutional:  Positive for malaise/fatigue. Negative for chills, fever and weight loss.  HENT:  Negative for hearing loss, nosebleeds, sore throat and tinnitus.   Eyes:  Negative for blurred vision, double vision, photophobia and pain.  Respiratory:  Negative for cough, hemoptysis, shortness of breath and wheezing.   Cardiovascular:  Negative for chest pain, palpitations and leg swelling.  Gastrointestinal:  Positive for constipation. Negative for abdominal pain, blood in stool, diarrhea, melena, nausea and vomiting.  Genitourinary:  Negative for dysuria, flank pain, hematuria and urgency.  Musculoskeletal:  Positive for back pain and joint pain. Negative for falls and myalgias.  Skin:  Negative for itching and rash.  Neurological:  Positive for dizziness. Negative for tingling, sensory change, loss of consciousness, weakness and headaches.  Endo/Heme/Allergies:  Negative for environmental allergies. Does not bruise/bleed easily.  Psychiatric/Behavioral:  Negative for depression. The patient is not nervous/anxious and does not have insomnia.     Allergies  Allergen Reactions   Kiwi Extract Itching and Swelling    The real kiwi fruit   Codeine Other (See Comments)    Felt funny.   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   Chicken pox    Depression    Fatty liver     Fatty liver    Gastric polyps    GERD (gastroesophageal reflux disease)    Gestational diabetes    prediabetes out side of having baby   Headache    migraines   Heart murmur    History of abnormal mammogram 08/2006, 01/2008, 03/2009   Hypertension    IBS (irritable bowel syndrome)    Joint disorder    hx of left ankle tendonitis, bursitis, heel spur   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 Smith/ laprascopic surgery   COLONOSCOPY  04/04/2000   COLONOSCOPY WITH PROPOFOL  N/A 02/12/2019   Procedure: COLONOSCOPY WITH PROPOFOL ;  Surgeon: Gaylyn Gladis PENNER, MD;  Location: [facility] ENDOSCOPY;  Service: Endoscopy;  Laterality: N/A;   ESOPHAGOGASTRODUODENOSCOPY (EGD) WITH PROPOFOL  N/A 05/01/2015   Procedure: ESOPHAGOGASTRODUODENOSCOPY (EGD) WITH PROPOFOL ;  Surgeon: Deward CINDERELLA Piedmont, MD;  Location: [facility] ENDOSCOPY;  Service: Gastroenterology;  Laterality: N/A;   ESOPHAGOGASTRODUODENOSCOPY (EGD) WITH PROPOFOL  N/A 02/12/2019   Procedure: ESOPHAGOGASTRODUODENOSCOPY (EGD) WITH PROPOFOL ;  Surgeon: Gaylyn Gladis PENNER, MD;  Location: [facility] ENDOSCOPY;  Service: Endoscopy;  Laterality: N/A;   EYE SURGERY  08/2012   laser surgery on retina/ DR Appenzeller   FINGER SURGERY     repair of tendon in right index, Dr. Nancyann in [facility]   FOOT SURGERY     Dr. Verta   IRRIGATION AND  DEBRIDEMENT SEBACEOUS CYST     right upper back   LAPAROSCOPIC SUPRACERVICAL HYSTERECTOMY  06/2007   Dr Kincius/ adhesions/CPP/adenomyosis   MASTECTOMY PARTIAL / LUMPECTOMY Right 01/2008   with sentinal lymph node biopsy/ infiltrating ductal cancer   REPAIR PERONEAL TENDONS ANKLE  10/2019   with tarsal exostectomy and sural neuroma excision on right    SKIN CANCER EXCISION     back and calves   WISDOM TOOTH EXTRACTION  1991   Social History   Socioeconomic History   Marital status: Married    Spouse name: Marcey   Number of children: 1   Years of education: 14   Highest education level: Not on file  Occupational History   Occupation: CMA  Tobacco Use   Smoking status: Never   Smokeless tobacco: Never  Vaping Use   Vaping status: Never Used  Substance and Sexual Activity   Alcohol use: Yes    Alcohol/week: 0.0 standard drinks of alcohol    Comment: rare, 1-2  drinks per year   Drug use: No   Sexual activity: Yes    Partners: Male    Birth control/protection: Surgical    Comment: Hysterectomy   Other Topics Concern   Not on file  Social History Narrative   Lives in [place] with husband, no pets.  Works at [employer].      Diet - regular   Exercise - occasional   Social Drivers of Health   Financial Resource Strain: Low Risk  (01/16/2024)   Received from [facility]   Overall Financial Resource Strain (CARDIA)    Difficulty of Paying Living Expenses: Not hard at all  Food Insecurity: No Food Insecurity (01/16/2024)   Received from [facility]   Hunger Vital Sign    Within the past 12 months, you worried that your food would run out before you got the money to buy more.: Never true    Within the past 12 months, the food you bought just didn't last and you didn't have money to get more.: Never true  Transportation Needs: No Transportation Needs (01/16/2024)   Received from [facility] - Transportation    In  the past 12 months, has lack of transportation kept you from medical appointments or from getting medications?: No    Lack of Transportation (Non-Medical): No  Physical Activity: Insufficiently Active (07/27/2017)   Exercise Vital Sign    Days of Exercise per Week: 4 days    Minutes of Exercise per Session: 30 min  Stress: No Stress Concern Present (07/27/2017)   [employer] of Occupational Health - Occupational Stress Questionnaire    Feeling of Stress : Only a little  Social Connections: Socially Integrated (07/27/2017)   Social Connection and Isolation Panel    Frequency of Communication with Friends and Family: More than three times a week    Frequency of Social Gatherings with Friends and Family: Once a week    Attends Religious Services: More than 4 times per year    Active Member of [employer] or Organizations: Yes    Attends Banker Meetings: More than 4 times per year    Marital Status: Married  Catering manager Violence: Not At Risk (07/27/2017)   Humiliation, Afraid, Rape, and Kick questionnaire    Fear of Current or Ex-Partner: No    Emotionally Abused: No    Physically Abused: No    Sexually Abused: No   Family History  Problem Relation Age of Onset   Hypertension Mother    Thyroid  disease Mother    Breast cancer Mother 30   Colon cancer Mother 83       again at 60   Atrial fibrillation Mother    Hip fracture Mother    Skin cancer Mother    Stroke Father    Hypertension Father    Cancer Father 52       prostate   Parkinson's disease Father    Skin cancer Father    Atrial fibrillation Sister        Pacemaker inserted end of Sept-beginning of Oct. 2024   Hypertension Sister    Thyroid  disease Sister    Cancer Sister        skin/ squamous cell   Heart Problems Sister    Diabetes Maternal Aunt    Cancer Maternal Aunt    Breast cancer Maternal Aunt 58   Lymphoma Maternal Uncle    Heart disease Maternal Uncle  Melanoma Maternal Uncle 77   Lung  cancer Paternal Aunt 75       smoker   Diabetes Maternal Grandmother    Stroke Maternal Grandfather    Heart disease Paternal Grandfather    Thyroid  cancer Cousin        maternal   Breast cancer Cousin 30       maternal   Breast cancer Cousin 66   Colon cancer Cousin     Current Outpatient Medications:    aspirin EC 81 MG tablet, Take by mouth., Disp: , Rfl:    b complex vitamins capsule, Take 1 capsule by mouth daily., Disp: , Rfl:    Blood Glucose Monitoring Suppl (BLOOD GLUCOSE MONITOR SYSTEM) w/Device KIT, Use to test blood sugars in the morning, at noon, and at bedtime., Disp: 1 kit, Rfl: 0   buPROPion  (WELLBUTRIN  XL) 150 MG 24 hr tablet, Take 1 tablet (150 mg total) by mouth daily. Take along a 300mg  tablet for a total daily dose of 450mg ., Disp: 90 tablet, Rfl: 3   buPROPion  (WELLBUTRIN  XL) 300 MG 24 hr tablet, Take 1 tablet (300 mg total) by mouth daily., Disp: 90 tablet, Rfl: 3   busPIRone  (BUSPAR ) 10 MG tablet, Take 1 tablet (10 mg total) by mouth 2 (two) times daily, Disp: 180 tablet, Rfl: 1   calcium carbonate (OSCAL) 1500 (600 Ca) MG TABS tablet, Take by mouth 2 (two) times daily with a meal., Disp: , Rfl:    Cholecalciferol (VITAMIN D3) 10 MCG (400 UNIT) CAPS, Take by mouth., Disp: , Rfl:    citalopram  (CELEXA ) 40 MG tablet, Take 1 tablet (40 mg total) by mouth daily., Disp: 90 tablet, Rfl: 3   diphenhydrAMINE  (BENADRYL ) 25 MG tablet, Take 25 mg by mouth at bedtime as needed for sleep. Pt states daily now for sleep and allergies., Disp: , Rfl:    gabapentin  (NEURONTIN ) 100 MG capsule, Take 1 capsule (100 mg total) by mouth every morning AND 2 capsules (200 mg total) every evening., Disp: 270 capsule, Rfl: 1   HYDROmorphone  (DILAUDID ) 2 MG tablet, Take 0.5 tablets (1 mg total) by mouth every 6 (six) hours as needed for severe pain (pain score 7-10)., Disp: 120 tablet, Rfl: 0   ketotifen  (ZADITOR ) 0.025 % ophthalmic solution, Place 1 drop into both eyes daily., Disp: , Rfl:     Magnesium Glycinate 100 MG CAPS, , Disp: , Rfl:    metFORMIN  (GLUCOPHAGE -XR) 500 MG 24 hr tablet, Take 1 tablet (500 mg total) by mouth 2 (two) times daily with a meal., Disp: 180 tablet, Rfl: 1   morphine  (MS CONTIN ) 30 MG 12 hr tablet, Take 1 tablet (30 mg total) by mouth every 12 (twelve) hours., Disp: 60 tablet, Rfl: 0   Multiple Vitamin (MULTIVITAMIN) tablet, Take 1 tablet by mouth daily., Disp: , Rfl:    omeprazole  (PRILOSEC) 20 MG capsule, Take 1 capsule (20 mg total) by mouth daily., Disp: 90 capsule, Rfl: 3   ondansetron  (ZOFRAN ) 4 MG tablet, Take 1 tablet (4 mg total) by mouth every 8 (eight) hours as needed for nausea or vomiting., Disp: 90 tablet, Rfl: 0   ondansetron  (ZOFRAN ) 8 MG tablet, Take 1 tablet (8 mg total) by mouth every 8 (eight) hours as needed for nausea or vomiting., Disp: 30 tablet, Rfl: 1   prochlorperazine  (COMPAZINE ) 10 MG tablet, Take 1 tablet (10 mg total) by mouth every 6 (six) hours as needed for nausea or vomiting., Disp: 30 tablet, Rfl: 1   capivasertib  (  TRUQAP ) 160 MG pack, Take 2 tablets (320 mg total) by mouth 2 (two) times daily. Take for 4 days, then hold for 3 days. Repeat every 7 days. (Patient not taking: Reported on 03/07/2024), Disp: 64 tablet, Rfl: 0   diphenoxylate -atropine  (LOMOTIL ) 2.5-0.025 MG tablet, Take 2 tablets by mouth 4 (four) times daily as needed for diarrhea or loose stools. (Patient not taking: Reported on 03/07/2024), Disp: 60 tablet, Rfl: 0   loperamide  (IMODIUM ) 2 MG capsule, Take 2 tabs by mouth with first loose stool, then 1 tab with each additional loose stool as needed. Do not exceed 8 tabs in a 24-hour period (Patient not taking: Reported on 03/07/2024), Disp: 30 capsule, Rfl: 0  Physical exam:  Vitals:   03/07/24 1359  BP: (!) 104/44  Pulse: 93  Resp: 16  Temp: 98.7 F (37.1 C)  TempSrc: Tympanic  SpO2: 97%  Weight: 219 lb (99.3 kg)   Physical Exam Vitals reviewed.  Constitutional:      Appearance: She is not  ill-appearing.  Cardiovascular:     Rate and Rhythm: Normal rate and regular rhythm.     Pulses: Normal pulses.     Heart sounds: Normal heart sounds.  Pulmonary:     Effort: No respiratory distress.     Breath sounds: Normal breath sounds.  Abdominal:     General: There is no distension.     Tenderness: There is no abdominal tenderness. There is no right CVA tenderness or left CVA tenderness.  Musculoskeletal:        General: No deformity.     Thoracic back: No bony tenderness.     Lumbar back: No bony tenderness.       Back:     Left hip: Bony tenderness present. Normal range of motion.     Comments: Ambulating in to clinic- slowed  Neurological:     Mental Status: She is alert and oriented to person, place, and time.  Psychiatric:        Mood and Affect: Mood normal.        Behavior: Behavior normal.        Latest Ref Rng & Units 03/07/2024    1:30 PM  CMP  Glucose 70 - 99 mg/dL 883   BUN 8 - 23 mg/dL 11   Creatinine 9.55 - 1.00 mg/dL 8.88   Sodium 864 - 854 mmol/L 137   Potassium 3.5 - 5.1 mmol/L 4.5   Chloride 98 - 111 mmol/L 104   CO2 22 - 32 mmol/L 20   Calcium 8.9 - 10.3 mg/dL 8.7   Total Protein 6.5 - 8.1 g/dL 6.5   Total Bilirubin 0.0 - 1.2 mg/dL 0.5   Alkaline Phos 38 - 126 U/L 133   AST 15 - 41 U/L 35   ALT 0 - 44 U/L 25       Latest Ref Rng & Units 03/07/2024    1:31 PM  CBC  WBC 4.0 - 10.5 K/uL 7.8   Hemoglobin 12.0 - 15.0 g/dL 89.4   Hematocrit 63.9 - 46.0 % 34.4   Platelets 150 - 400 K/uL 247    MR LIVER W WO CONTRAST Result Date: 03/02/2024 CLINICAL DATA:  liver metastases; breast cancer. EXAM: MRI ABDOMEN WITHOUT AND WITH CONTRAST TECHNIQUE: Multiplanar multisequence MR imaging of the abdomen was performed both before and after the administration of intravenous contrast. CONTRAST:  9mL GADAVIST  GADOBUTROL  1 MMOL/ML IV SOLN COMPARISON:  CT scan chest, abdomen and pelvis from 02/08/2024. FINDINGS: Lower chest:  Unremarkable MR appearance to the lung  bases. No pleural effusion. No pericardial effusion. Normal heart size. Hepatobiliary: The liver is normal in size. Noncirrhotic configuration. There are approximately 15, T1 hypointense and T2 mildly hyperintense, lesions throughout the liver with largest in the left hepatic lobe, segment 2 measuring up to 1.1 x 1.1 cm. The lesions exhibit diffuse solid or target like enhancement on the postcontrast images and are compatible with metastases. The portal vein and hepatic veins are patent. No intrahepatic or extrahepatic bile duct dilatation. No choledocholithiasis. Status post cholecystectomy. Pancreas: No mass, inflammatory changes or other parenchymal abnormality identified. No main pancreatic duct dilation. Spleen:  Within normal limits in size and appearance. No focal mass. Adrenals/Urinary Tract: Unremarkable adrenal glands. No hydroureteronephrosis on either side. There are several simple cysts in the left kidney with largest arising from the upper pole, laterally measuring 1.1 x 1.2 cm. Stomach/Bowel: There is tiny sliding hiatal hernia. Visualized portions within the abdomen are unremarkable. No disproportionate dilation of bowel loops. Vascular/Lymphatic: No pathologically enlarged lymph nodes identified. No abdominal aortic aneurysm demonstrated. No ascites. Other:  None. Musculoskeletal: There are multiple heterogeneous rim enhancing lesions in the visualized thoracolumbar spine and pelvic bones, compatible with metastases. No pathological fracture seen within the limitations of this exam. IMPRESSION: 1. There are multiple liver metastases, as described above. 2. There are also multiple metastases in the visualized thoracolumbar spine and pelvic bones. 3. Multiple other nonacute observations, as described above. Electronically Signed   By: Ree Molt M.D.   On: 03/02/2024 11:20   CT CHEST ABDOMEN PELVIS W CONTRAST Result Date: 02/15/2024 CLINICAL DATA:  Metastatic breast cancer.  * Tracking Code: BO  * EXAM: CT CHEST, ABDOMEN, AND PELVIS WITH CONTRAST TECHNIQUE: Multidetector CT imaging of the chest, abdomen and pelvis was performed following the standard protocol during bolus administration of intravenous contrast. RADIATION DOSE REDUCTION: This exam was performed according to the departmental dose-optimization program which includes automated exposure control, adjustment of the mA and/or kV according to patient size and/or use of iterative reconstruction technique. CONTRAST:  OMNIPAQUE  IOHEXOL  300 MG/ML  SOLN COMPARISON:  Multiple priors including CT Nov 24, 2023 and same day nuclear medicine bone scan FINDINGS: CT CHEST FINDINGS Cardiovascular: Normal caliber thoracic aorta. Normal size heart. No significant pericardial effusion/thickening. Mediastinum/Nodes: No suspicious thyroid  nodule. No pathologically enlarged mediastinal, hilar or axillary lymph nodes. Esophagus is grossly unremarkable. Lungs/Pleura: A few scattered tiny pulmonary nodules are stable from prior examination for instance a 6 mm subpleural pulmonary nodule in the right lower lobe on image 48/4. No new suspicious pulmonary nodules or masses. Scattered atelectasis/scarring. Musculoskeletal: Increased sclerosis in the sclerotic/mixed lytic and sclerotic osseous metastasis throughout the axial and proximal appendicular skeleton. No new suspicious osseous lesion identified. CT ABDOMEN PELVIS FINDINGS Hepatobiliary: New ill-defined hypodensity in the left lobe of the liver measures 9 mm on image 41/2. gallbladder surgically absent. Similar prominence of the biliary tree favored reservoir effect post cholecystectomy. Pancreas: No pancreatic ductal dilation or evidence of acute inflammation. Spleen: No splenomegaly. Adrenals/Urinary Tract: No suspicious adrenal nodule/mass. No hydronephrosis. 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 Urinary bladder is unremarkable for degree of distension. Stomach/Bowel: Stomach is unremarkable for degree of  distension. No pathologic dilation of small or large bowel. Colonic stool burden compatible with constipation. Colonic diverticulosis without findings of acute diverticulitis. Vascular/Lymphatic: Normal caliber abdominal aorta. Smooth IVC contours. The portal, splenic and superior mesenteric veins are patent. No pathologically enlarged abdominal or pelvic lymph nodes. Reproductive: Status  post hysterectomy. No adnexal masses. Other: No significant abdominopelvic free fluid. Musculoskeletal: Increased sclerosis in the sclerotic/mixed lytic and sclerotic osseous metastasis throughout the axial and proximal appendicular skeleton. No definite new osseous metastasis identified. IMPRESSION: 1. Increased sclerosis in the sclerotic/mixed lytic and sclerotic osseous metastasis throughout the axial and proximal appendicular skeleton, which is nonspecific but favored to reflect posttreatment change. 2. New ill-defined hypodensity in the left lobe of the liver measures 9 mm, nonspecific but suspicious for metastatic disease. Consider more definitive characterization by hepatic protocol MRI with and without contrast. 3. Stable scattered tiny pulmonary nodules. 4. Colonic diverticulosis without findings of acute diverticulitis. 5. Colonic stool burden compatible with constipation. Electronically Signed   By: Reyes Holder M.D.   On: 02/15/2024 16:11   NM Bone Scan Whole Body Result Date: 02/10/2024 CLINICAL DATA:  History of metastatic right breast cancer EXAM: NUCLEAR MEDICINE WHOLE BODY BONE SCAN TECHNIQUE: Whole body anterior and posterior images were obtained approximately 3 hours after intravenous injection of radiopharmaceutical. RADIOPHARMACEUTICALS:  22.57 mCi Technetium-78m MDP IV COMPARISON:  11/24/2023 FINDINGS: Anterior and posterior whole body planar images are obtained after radiotracer administration. Physiologic excretion of radiotracer is seen within the kidneys and bladder. Widespread areas of increased  radiotracer uptake are again seen throughout the axial and appendicular skeleton, most pronounced within the shoulders, thoracolumbar spine, thoracic cage, sacrum, right hemipelvis, and inter trochanteric right femur. Several lesions demonstrate increase in intensity of radiotracer uptake since prior study, most prominently within the T10 vertebral body, proximal right femur, and right upper lateral thoracic cage. Likely degenerative activity seen within the acromioclavicular joints, bilateral knees, and bilateral ankle/hind feet. IMPRESSION: 1. Widespread metastatic disease within the axial and appendicular skeleton, with increased intensity of radiotracer uptake within previous lesions seen within the right upper lateral thoracic cage, T10 vertebral body, and intertrochanteric proximal right femur. Electronically Signed   By: Ozell Daring M.D.   On: 02/10/2024 20:52   Assessment and plan- Patient is a 63 y.o. female    Dizziness- BP soft 104/44. Not on antihypertensives and no apparent fluid deficit. However, improves somewhat with IV fluids therefore, plan for IV fluids today. Also question medication side effect? Recommended fall precautions and walker. She's interested in staying mobile and best suited for a 4 wheel rolling walker. Prescription sent.  Pain due to neoplasm- unrelieved by MS Contin  30 mg q12h with dilaudid  prn. Increase MS Contin  to 30 mg q8h and continue dilaudid  for breakthrough pain. Plan for hip xrays to evaluate for possible fracture. She had previous bone scan end of July that was reviewed and showed potentially worsening bone density, increased intensity of uptake. She has not previously had radiation to the right hip so that could be considered. Last received zometa  in June. Given acute pain plan for morphine  4 mg IV today in clinic.  Metastatic Breast Cancer- Truqap  + fulvestrant  held x 1 week and plan is to start dose reduced but not yet received. Follow up with Dr. Melanee Liver  biopsy- reviewed prep including hold aspirin 5 days prior to procedure - last dose 8/29. NPO after midnight.  AKI- serum creatinine of 2, improved to 1.11 today. Proceed with IV fluids today.  Constipation- OIC d/t narcotics. Restart constipation regimen.   Disposition:  Fluids and pain medicine today Xrays today F/u as scheduled- la   Visit Diagnosis 1. Right hip pain   2. Cancer associated pain   3. Pain from bone metastases [facility])    Patient expressed understanding and  was in agreement with this plan. She also understands that She can call clinic at any time with any questions, concerns, or complaints.   Thank you for allowing me to participate in the care of this very pleasant patient.   Tinnie Dawn, DNP, AGNP-C, AOCNP [facility] [phone]

## 2024-03-07 NOTE — Telephone Encounter (Signed)
 The patient called and said that she is having dizziness shaking a lot she did eat breakfast today she does not know what to do.  Messages do not go to the ER they just write it out or what

## 2024-03-07 NOTE — Telephone Encounter (Signed)
 Pt appt 1:45 labs and see NP after that

## 2024-03-07 NOTE — Progress Notes (Signed)
 Pt and husband in for [facility] visit, pt has been having severe uncontrolled pain in right hip radiating down to mid thigh.  Pt states she started feeling dizzy this morning and it has progressively gotten worse throughtout the day. Pt states she has not received capivasertib  yet so it has not been resumed at new dose.  Pt also reports being shaky and twitching.

## 2024-03-08 ENCOUNTER — Inpatient Hospital Stay

## 2024-03-13 ENCOUNTER — Other Ambulatory Visit: Payer: Self-pay | Admitting: Radiology

## 2024-03-13 DIAGNOSIS — R160 Hepatomegaly, not elsewhere classified: Secondary | ICD-10-CM

## 2024-03-14 ENCOUNTER — Other Ambulatory Visit: Payer: Self-pay | Admitting: Diagnostic Radiology

## 2024-03-14 ENCOUNTER — Institutional Professional Consult (permissible substitution): Admitting: Radiation Oncology

## 2024-03-14 NOTE — Progress Notes (Signed)
 Patient for US  guided Liver Biopsy on Thurs 03/15/24, I called and spoke with the patient on the phone and gave pre-procedure instructions. Pt was made aware to be here at 8a, last dose of ASA 81mg  was on Friday 03/09/24, NPO after MN prior to procedure as well as driver post procedure/recovery/discharge. Pt stated understanding.  Called 03/14/24

## 2024-03-14 NOTE — H&P (Signed)
 Chief Complaint: Patient was seen in consultation today for liver lesion in the setting of metastatic breast cancer, and with consideration for biopsy.  Referring Provider(s): Dr. Annah Skene, MD   Supervising Physician: Luverne Aran  Patient Status: [facility] - Out[facility]  Patient is Full Code  History of Present Illness: Jacqueline Deleon is a 63 y.o. 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  Per Dr. Darold progress note on 8/15: [Patient] with history of metastatic breast cancer ER positive PR positive HER2 negative with bone only metastases presently on Faslodex  plus capivasertib  here for routine follow-up and discuss CT scan results and further management   I have CT chest abdomen pelvis images independently and discussed findings with the patient.  Have also reviewed bone scan findings and discussed findings with the patient.  Bone scan shows some increase in intensity of the existing bone lesions but no new bone lesions.  CT chest abdomen and pelvisWith contrast did not show any evidence of new metastatic disease but she was found to have a 9 mm hypodensity in the left lobe of the liver.  I will plan to get an MRI liver with and without contrast to characterize this further.   MR Liver w/ w/o cm on 8/22: IMPRESSION: 1. There are multiple liver metastases, as described above. 2. There are also multiple metastases in the visualized thoracolumbar spine and pelvic bones. 3. Multiple other nonacute observations, as described above.   Interventional Radiology was requested for liver lesion biopsy. Request was reviewed and approved by Dr. Jennefer. Patient is scheduled for same in IR today.   Patient is alert and laying in bed, calm.  Patient is currently without any significant complaints.  Patient denies any fevers, headache, chest pain, SOB, cough, abdominal pain,  nausea, vomiting or bleeding.     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   Chicken pox    Depression    Fatty liver    Fatty liver    Gastric polyps    GERD (gastroesophageal reflux disease)    Gestational diabetes    prediabetes out side of having baby   Headache    migraines   Heart murmur    History of abnormal mammogram 08/2006, 01/2008, 03/2009   Hypertension    IBS (irritable bowel syndrome)    Joint disorder    hx of left ankle tendonitis, bursitis, heel spur   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 Smith/ laprascopic surgery   COLONOSCOPY  04/04/2000   COLONOSCOPY WITH PROPOFOL  N/A 02/12/2019   Procedure: COLONOSCOPY WITH PROPOFOL ;  Surgeon: Gaylyn Gladis PENNER, MD;  Location: [facility] ENDOSCOPY;  Service: Endoscopy;  Laterality: N/A;   ESOPHAGOGASTRODUODENOSCOPY (EGD) WITH PROPOFOL  N/A 05/01/2015   Procedure: ESOPHAGOGASTRODUODENOSCOPY (EGD) WITH PROPOFOL ;  Surgeon: Deward CINDERELLA Piedmont,  MD;  Location: [facility] ENDOSCOPY;  Service: Gastroenterology;  Laterality: N/A;   ESOPHAGOGASTRODUODENOSCOPY (EGD) WITH PROPOFOL  N/A 02/12/2019   Procedure: ESOPHAGOGASTRODUODENOSCOPY (EGD) WITH PROPOFOL ;  Surgeon: Gaylyn Gladis PENNER, MD;  Location: [facility] ENDOSCOPY;  Service: Endoscopy;  Laterality: N/A;   EYE SURGERY  08/2012   laser surgery on retina/ DR Appenzeller   FINGER SURGERY     repair of tendon in right index, Dr. Nancyann in [facility]   FOOT SURGERY     Dr. Verta   IRRIGATION AND DEBRIDEMENT SEBACEOUS CYST     right upper back   LAPAROSCOPIC SUPRACERVICAL HYSTERECTOMY  06/2007   Dr Kincius/ adhesions/CPP/adenomyosis   MASTECTOMY PARTIAL / LUMPECTOMY Right 01/2008   with sentinal lymph node biopsy/ infiltrating ductal cancer   REPAIR PERONEAL TENDONS ANKLE  10/2019   with tarsal exostectomy and sural neuroma excision on right    SKIN CANCER EXCISION     back and calves   WISDOM TOOTH EXTRACTION  1991    Allergies: Kiwi extract, Codeine, Food, Amoxicillin, Erythromycin, Sulfa  antibiotics, and Tape  Medications: Prior to Admission medications   Medication Sig Start Date End Date Taking? Authorizing Provider  aspirin EC 81 MG tablet Take by mouth. 05/20/22   [provider]  b complex vitamins capsule Take 1 capsule by mouth daily.    [provider]  Blood Glucose Monitoring Suppl (BLOOD GLUCOSE MONITOR SYSTEM) w/Device KIT Use to test blood sugars in the morning, at noon, and at bedtime. 12/29/23   Melanee Annah BROCKS, MD  buPROPion  (WELLBUTRIN  XL) 150 MG 24 hr tablet Take 1 tablet (150 mg total) by mouth daily. Take along a 300mg  tablet for a total daily dose of 450mg . 11/21/23     buPROPion  (WELLBUTRIN  XL) 300 MG 24 hr tablet Take 1 tablet (300 mg total) by mouth daily. 07/27/23     busPIRone  (BUSPAR ) 10 MG tablet Take 1 tablet (10 mg total) by mouth 2 (two) times daily 05/05/23     calcium carbonate (OSCAL) 1500 (600 Ca) MG TABS tablet Take by mouth 2 (two) times  daily with a meal.    [provider]  capivasertib  (TRUQAP ) 160 MG pack Take 2 tablets (320 mg total) by mouth 2 (two) times daily. Take for 4 days, then hold for 3 days. Repeat every 7 days. Patient not taking: Reported on 03/07/2024 03/05/24   Rao, Archana C, MD  Cholecalciferol (VITAMIN D3) 10 MCG (400 UNIT) CAPS Take by mouth.    [provider]  citalopram  (CELEXA ) 40 MG tablet Take 1 tablet (40 mg total) by mouth daily. 09/09/23     diphenhydrAMINE  (BENADRYL ) 25 MG tablet Take 25 mg by mouth at bedtime as needed for sleep. Pt states daily now for sleep and allergies.    [provider]  diphenoxylate -atropine  (LOMOTIL ) 2.5-0.025 MG tablet Take 2 tablets by mouth 4 (four) times daily as needed for diarrhea or loose stools. Patient  not taking: Reported on 03/07/2024 01/27/24   Brahmanday, Govinda R, MD  gabapentin  (NEURONTIN ) 100 MG capsule Take 1 capsule (100 mg total) by mouth every morning AND 2 capsules (200 mg total) every evening. 12/12/23     HYDROmorphone  (DILAUDID ) 2 MG tablet Take 2 tablets (4 mg total) by mouth every 6 (six) hours as needed for severe pain (pain score 7-10). 03/07/24   Dasie Tinnie MATSU, NP  ketotifen  (ZADITOR ) 0.025 % ophthalmic solution Place 1 drop into both eyes daily.    [provider]  loperamide  (IMODIUM ) 2 MG capsule Take 2 tabs by mouth with first loose stool, then 1 tab with each additional loose stool as needed. Do not exceed 8 tabs in a 24-hour period Patient not taking: Reported on 03/07/2024 02/25/23   Melanee Annah BROCKS, MD  Magnesium Glycinate 100 MG CAPS  09/26/23   [provider]  metFORMIN  (GLUCOPHAGE -XR) 500 MG 24 hr tablet Take 1 tablet (500 mg total) by mouth 2 (two) times daily with a meal. 12/12/23     morphine  (MS CONTIN ) 30 MG 12 hr tablet Take 1 tablet (30 mg total) by mouth every 8 (eight) hours. 03/07/24   Dasie Tinnie MATSU, NP  Multiple Vitamin (MULTIVITAMIN) tablet Take 1 tablet by mouth daily.    [provider]  omeprazole  (PRILOSEC) 20 MG capsule Take 1 capsule (20 mg total) by mouth daily. 03/09/23   Unk Corinn Skiff, MD  ondansetron  (ZOFRAN ) 4 MG tablet Take 1 tablet (4 mg total) by mouth every 8 (eight) hours as needed for nausea or vomiting. 01/18/23   Tonette Lauraine HERO, PA-C  ondansetron  (ZOFRAN ) 8 MG tablet Take 1 tablet (8 mg total) by mouth every 8 (eight) hours as needed for nausea or vomiting. 02/25/23   Melanee Annah BROCKS, MD  prochlorperazine  (COMPAZINE ) 10 MG tablet Take 1 tablet (10 mg total) by mouth every 6 (six) hours as needed for nausea or vomiting. 02/25/23   Melanee Annah BROCKS, MD     Family History  Problem Relation Age of Onset   Hypertension Mother    Thyroid  disease Mother    Breast cancer Mother 43   Colon cancer Mother 76       again at 72   Atrial fibrillation Mother    Hip fracture Mother    Skin cancer Mother    Stroke Father    Hypertension Father    Cancer Father 68       prostate   Parkinson's disease Father    Skin cancer Father    Atrial fibrillation Sister        Pacemaker inserted end of Sept-beginning of Oct. 2024   Hypertension Sister    Thyroid  disease Sister    Cancer Sister        skin/ squamous cell   Heart Problems Sister    Diabetes Maternal Aunt    Cancer Maternal Aunt    Breast cancer Maternal Aunt 54   Lymphoma Maternal Uncle    Heart disease Maternal Uncle    Melanoma Maternal Uncle 16   Lung cancer Paternal Aunt 71       smoker   Diabetes Maternal Grandmother    Stroke Maternal Grandfather    Heart disease Paternal Grandfather    Thyroid  cancer Cousin        maternal   Breast cancer Cousin 5       maternal   Breast cancer Cousin 50   Colon cancer Cousin  Social History   Socioeconomic History   Marital status: Married    Spouse name: Marcey   Number of children: 1   Years of education: 14   Highest education level: Not on file  Occupational History   Occupation: CMA  Tobacco Use   Smoking status: Never    Smokeless tobacco: Never  Vaping Use   Vaping status: Never Used  Substance and Sexual Activity   Alcohol use: Yes    Alcohol/week: 0.0 standard drinks of alcohol    Comment: rare, 1-2 drinks per year   Drug use: No   Sexual activity: Yes    Partners: Male    Birth control/protection: Surgical    Comment: Hysterectomy   Other Topics Concern   Not on file  Social History Narrative   Lives in [place] with husband, no pets.  Works at [employer].      Diet - regular   Exercise - occasional   Social Drivers of Health   Financial Resource Strain: Low Risk  (01/16/2024)   Received from [facility]   Overall Financial Resource Strain (CARDIA)    Difficulty of Paying Living Expenses: Not hard at all  Food Insecurity: No Food Insecurity (01/16/2024)   Received from [facility]   Hunger Vital Sign    Within the past 12 months, you worried that your food would run out before you got the money to buy more.: Never true    Within the past 12 months, the food you bought just didn't last and you didn't have money to get more.: Never true  Transportation Needs: No Transportation Needs (01/16/2024)   Received from [facility] - Transportation    In the past 12 months, has lack of transportation kept you from medical appointments or from getting medications?: No    Lack of Transportation (Non-Medical): No  Physical Activity: Insufficiently Active (07/27/2017)   Exercise Vital Sign    Days of Exercise per Week: 4 days    Minutes of Exercise per Session: 30 min  Stress: No Stress Concern Present (07/27/2017)   [employer] of Occupational Health - Occupational Stress Questionnaire    Feeling of Stress : Only a little  Social Connections: Socially Integrated (07/27/2017)   Social Connection and Isolation Panel    Frequency of Communication with Friends and Family: More than three times a week    Frequency of Social Gatherings with  Friends and Family: Once a week    Attends Religious Services: More than 4 times per year    Active Member of [employer] or Organizations: Yes    Attends Engineer, structural: More than 4 times per year    Marital Status: Married     Review of Systems: A 12 point ROS discussed and pertinent positives are indicated in the HPI above.  All other systems are negative.  Vital Signs: There were no vitals taken for this visit.  Advance Care Plan: The advanced care place/surrogate decision maker was discussed at the time of visit and the patient did not wish to discuss or was not able to name a surrogate decision maker or provide an advance care plan.  Physical Exam Constitutional:      Appearance: Normal appearance. She is obese.  HENT:     Mouth/Throat:     Mouth: Mucous membranes are dry.  Cardiovascular:     Rate and Rhythm: Normal rate and regular rhythm.     Pulses: Normal  pulses.     Heart sounds: Normal heart sounds.  Pulmonary:     Effort: Pulmonary effort is normal.     Breath sounds: Normal breath sounds.  Abdominal:     General: Abdomen is flat.     Palpations: Abdomen is soft.     Tenderness: There is no abdominal tenderness.  Musculoskeletal:        General: Normal range of motion.     Cervical back: Normal range of motion.  Skin:    General: Skin is warm and dry.  Neurological:     Mental Status: She is alert and oriented to person, place, and time.  Psychiatric:        Mood and Affect: Mood normal.        Behavior: Behavior normal.        Thought Content: Thought content normal.        Judgment: Judgment normal.     Imaging: DG HIP UNILAT W OR W/O PELVIS 2-3 VIEWS RIGHT Result Date: 03/07/2024 CLINICAL DATA:  hip pain- known bone metastases- eval for fracture EXAM: DG HIP (WITH OR WITHOUT PELVIS) 2-3V RIGHT COMPARISON:  None Available. FINDINGS: Pelvis is intact with normal and symmetric sacroiliac joints. No acute fracture or dislocation. There are  heterogeneous predominantly sclerotic lesions mainly overlying the right iliac vein, likely corresponds to provided history of bone metastases. Visualized sacral arcuate lines are unremarkable. Unremarkable symphysis pubis. There are mild degenerative changes of bilateral hip joints without significant joint space narrowing. Osteophytosis of the superior acetabulum. No radiopaque foreign bodies. IMPRESSION: *No acute fracture or dislocation. *Heterogeneous predominantly sclerotic lesions mainly overlying the right iliac vein, likely corresponds to provided history of bone metastases. Electronically Signed   By: Ree Molt M.D.   On: 03/07/2024 17:04   MR LIVER W WO CONTRAST Result Date: 03/02/2024 CLINICAL DATA:  liver metastases; breast cancer. EXAM: MRI ABDOMEN WITHOUT AND WITH CONTRAST TECHNIQUE: Multiplanar multisequence MR imaging of the abdomen was performed both before and after the administration of intravenous contrast. CONTRAST:  9mL GADAVIST  GADOBUTROL  1 MMOL/ML IV SOLN COMPARISON:  CT scan chest, abdomen and pelvis from 02/08/2024. FINDINGS: Lower chest: Unremarkable MR appearance to the lung bases. No pleural effusion. No pericardial effusion. Normal heart size. Hepatobiliary: The liver is normal in size. Noncirrhotic configuration. There are approximately 15, T1 hypointense and T2 mildly hyperintense, lesions throughout the liver with largest in the left hepatic lobe, segment 2 measuring up to 1.1 x 1.1 cm. The lesions exhibit diffuse solid or target like enhancement on the postcontrast images and are compatible with metastases. The portal vein and hepatic veins are patent. No intrahepatic or extrahepatic bile duct dilatation. No choledocholithiasis. Status post cholecystectomy. Pancreas: No mass, inflammatory changes or other parenchymal abnormality identified. No main pancreatic duct dilation. Spleen:  Within normal limits in size and appearance. No focal mass. Adrenals/Urinary Tract:  Unremarkable adrenal glands. No hydroureteronephrosis on either side. There are several simple cysts in the left kidney with largest arising from the upper pole, laterally measuring 1.1 x 1.2 cm. Stomach/Bowel: There is tiny sliding hiatal hernia. Visualized portions within the abdomen are unremarkable. No disproportionate dilation of bowel loops. Vascular/Lymphatic: No pathologically enlarged lymph nodes identified. No abdominal aortic aneurysm demonstrated. No ascites. Other:  None. Musculoskeletal: There are multiple heterogeneous rim enhancing lesions in the visualized thoracolumbar spine and pelvic bones, compatible with metastases. No pathological fracture seen within the limitations of this exam. IMPRESSION: 1. There are multiple liver metastases, as described above. 2.  There are also multiple metastases in the visualized thoracolumbar spine and pelvic bones. 3. Multiple other nonacute observations, as described above. Electronically Signed   By: Ree Molt M.D.   On: 03/02/2024 11:20    Labs:  CBC: Recent Labs    02/13/24 0811 02/24/24 0759 02/28/24 0913 03/07/24 1331  WBC 6.2 8.6 4.8 7.8  HGB 11.3* 12.3 11.3* 10.5*  HCT 36.6 41.1 37.2 34.4*  PLT 267 372 296 247    COAGS: No results for input(s): INR, APTT in the last 8760 hours.  BMP: Recent Labs    02/13/24 0811 02/24/24 0759 02/28/24 0912 03/07/24 1330  NA 136 139 136 137  K 4.1 4.9 4.1 4.5  CL 105 108 108 104  CO2 22 18* 18* 20*  GLUCOSE 130* 175*  176* 96 116*  BUN 11 24* 15 11  CALCIUM 8.9 9.4 8.7* 8.7*  CREATININE 1.26* 2.04* 1.37* 1.11*  GFRNONAA 49* 27* 44* 57*    LIVER FUNCTION TESTS: Recent Labs    01/26/24 0827 02/13/24 0811 02/24/24 0759 03/07/24 1330  BILITOT 0.4 0.5 0.6 0.5  AST 25 28 28  35  ALT 22 27 44 25  ALKPHOS 128* 140* 182* 133*  PROT 6.3* 7.0 7.8 6.5  ALBUMIN 3.2* 3.7 3.8 3.2*    TUMOR MARKERS: No results for input(s): AFPTM, CEA, CA199, CHROMGRNA in the last 8760  hours.  Assessment and Plan: Patient has a history of metastatic breast cancer with bone only metastases, and with new-found liver lesions on CT and MRI imaging. Patient was recommended for liver lesion biopsy for metastasis evaluation. Patient is in agreement with plan.  She presents for scheduled liver lesion biopsy in IR today.  Patient has been NPO since midnight.  All labs and medications are within acceptable parameters. Last dose Aspirin was on 8/31. No pertinent allergies.   Risks and benefits of liver lesion biopsy was discussed with the patient and/or patient's family including, but not limited to bleeding, infection, damage to adjacent structures or low yield requiring additional tests.  All of the questions were answered and there is agreement to proceed.  Consent signed and in chart.    Thank you for allowing our service to participate in LAZARIA SCHABEN 's care.  Electronically Signed: Carlin DELENA Griffon, PA-C   03/14/2024, 10:12 AM      I spent a total of 30 Minutes in face to face in clinical consultation, greater than 50% of which was counseling/coordinating care for liver lesion in the setting of metastatic breast cancer, and with consideration for biopsy.

## 2024-03-15 ENCOUNTER — Encounter: Payer: Self-pay | Admitting: Radiation Oncology

## 2024-03-15 ENCOUNTER — Other Ambulatory Visit: Payer: Self-pay

## 2024-03-15 ENCOUNTER — Ambulatory Visit
Admission: RE | Admit: 2024-03-15 | Discharge: 2024-03-15 | Disposition: A | Discharge status: Home / Self Care | Source: Ambulatory Visit | Attending: Radiation Oncology | Admitting: Radiation Oncology

## 2024-03-15 ENCOUNTER — Ambulatory Visit
Admission: RE | Admit: 2024-03-15 | Discharge: 2024-03-15 | Disposition: A | Discharge status: Home / Self Care | Source: Ambulatory Visit | Attending: Oncology | Admitting: Oncology

## 2024-03-15 VITALS — BP 117/70 | HR 78 | Temp 97.4°F | Resp 12

## 2024-03-15 DIAGNOSIS — Z510 Encounter for antineoplastic radiation therapy: Secondary | ICD-10-CM | POA: Insufficient documentation

## 2024-03-15 DIAGNOSIS — C50911 Malignant neoplasm of unspecified site of right female breast: Secondary | ICD-10-CM | POA: Insufficient documentation

## 2024-03-15 DIAGNOSIS — Z79818 Long term (current) use of other agents affecting estrogen receptors and estrogen levels: Secondary | ICD-10-CM | POA: Insufficient documentation

## 2024-03-15 DIAGNOSIS — C787 Secondary malignant neoplasm of liver and intrahepatic bile duct: Secondary | ICD-10-CM | POA: Insufficient documentation

## 2024-03-15 DIAGNOSIS — C50919 Malignant neoplasm of unspecified site of unspecified female breast: Secondary | ICD-10-CM | POA: Insufficient documentation

## 2024-03-15 DIAGNOSIS — Z1721 Progesterone receptor positive status: Secondary | ICD-10-CM | POA: Insufficient documentation

## 2024-03-15 DIAGNOSIS — C7951 Secondary malignant neoplasm of bone: Secondary | ICD-10-CM | POA: Insufficient documentation

## 2024-03-15 DIAGNOSIS — I10 Essential (primary) hypertension: Secondary | ICD-10-CM | POA: Diagnosis not present

## 2024-03-15 DIAGNOSIS — R160 Hepatomegaly, not elsewhere classified: Secondary | ICD-10-CM

## 2024-03-15 DIAGNOSIS — Z170 Estrogen receptor positive status [ER+]: Secondary | ICD-10-CM | POA: Diagnosis not present

## 2024-03-15 DIAGNOSIS — Z1732 Human epidermal growth factor receptor 2 negative status: Secondary | ICD-10-CM | POA: Insufficient documentation

## 2024-03-15 LAB — CBC
HCT: 30.5 % — ABNORMAL LOW (ref 36.0–46.0)
Hemoglobin: 9.4 g/dL — ABNORMAL LOW (ref 12.0–15.0)
MCH: 24.9 pg — ABNORMAL LOW (ref 26.0–34.0)
MCHC: 30.8 g/dL (ref 30.0–36.0)
MCV: 80.7 fL (ref 80.0–100.0)
Platelets: 350 K/uL (ref 150–400)
RBC: 3.78 MIL/uL — ABNORMAL LOW (ref 3.87–5.11)
RDW: 19.9 % — ABNORMAL HIGH (ref 11.5–15.5)
WBC: 9.4 K/uL (ref 4.0–10.5)
nRBC: 0 % (ref 0.0–0.2)

## 2024-03-15 LAB — PROTIME-INR
INR: 1.2 (ref 0.8–1.2)
Prothrombin Time: 15.9 s — ABNORMAL HIGH (ref 11.4–15.2)

## 2024-03-15 LAB — GLUCOSE, CAPILLARY: Glucose-Capillary: 105 mg/dL — ABNORMAL HIGH (ref 70–99)

## 2024-03-15 MED ORDER — SODIUM CHLORIDE 0.9 % IV SOLN: INTRAVENOUS | Status: DC

## 2024-03-15 MED ORDER — HYDROCODONE-ACETAMINOPHEN 5-325 MG PO TABS
1.0000 | ORAL_TABLET | ORAL | Status: DC | PRN
  Administered 2024-03-15: 2 via ORAL

## 2024-03-15 MED ORDER — FENTANYL CITRATE (PF) 100 MCG/2ML IJ SOLN
INTRAMUSCULAR | Status: AC | PRN
  Administered 2024-03-15: 50 ug via INTRAVENOUS
  Administered 2024-03-15: 25 ug via INTRAVENOUS

## 2024-03-15 MED ORDER — MIDAZOLAM HCL 2 MG/2ML IJ SOLN
INTRAMUSCULAR | Status: AC | PRN
  Administered 2024-03-15: 1 mg via INTRAVENOUS
  Administered 2024-03-15: .5 mg via INTRAVENOUS

## 2024-03-15 MED ORDER — HYDROCODONE-ACETAMINOPHEN 5-325 MG PO TABS
ORAL_TABLET | ORAL | Status: AC
  Filled 2024-03-15: qty 2

## 2024-03-15 MED ORDER — MIDAZOLAM HCL 2 MG/2ML IJ SOLN
INTRAMUSCULAR | Status: AC
  Filled 2024-03-15: qty 4

## 2024-03-15 MED ORDER — LIDOCAINE HCL (PF) 1 % IJ SOLN
10.0000 mL | Freq: Once | INTRAMUSCULAR | Status: AC
  Administered 2024-03-15: 10 mL via INTRADERMAL
  Filled 2024-03-15: qty 10

## 2024-03-15 MED ORDER — FENTANYL CITRATE (PF) 100 MCG/2ML IJ SOLN
INTRAMUSCULAR | Status: AC
  Filled 2024-03-15: qty 2

## 2024-03-15 NOTE — Progress Notes (Signed)
 Radiation Oncology Follow up Note old patient new area right hip metastasis  Name: Jacqueline Deleon   Date:   03/15/2024 MRN:  [id number] DOB: 23-Nov-1960    This 63 y.o. female presents to the clinic today for evaluation of progressive right hip pain and patient with known stage IV metastatic breast cancer previously treated back in 22 for metastatic breast cancer to her left hip.  REFERRING PROVIDER: Diedra Lame, MD  HPI: Patient is a 63 year old female well-known to our department having received palliative radiation therapy to her left hip back in 2022.  She has had an excellent response to pain in that area..  She is continue to have progressive pain in her right hip.  Bone scan back in July showed widespread metastatic disease in the axilla and appendicular skeleton with increased uptake within the previous lesion seen in the right upper lateral thoracic cage T10 vertebral body and intra crow proximal right femur.  She was also noted to have a small liver lesion which was biopsied today.  She is currently being treated withFaslodex plus Truqap  which has caused some significant diarrhea although they are planning a possible dose reduction.  She is ambulating fairly well.  Seen today for consideration of palliative radiation therapy to her right hip.  COMPLICATIONS OF TREATMENT: none  FOLLOW UP COMPLIANCE: keeps appointments   PHYSICAL EXAM:  BP 117/70   Pulse 78   Temp (!) 97.4 F (36.3 C) (Tympanic)   Resp 12  Range of motion of the right lower extremity does elicit some pain.  Palpation of her spine especially the lower lumbar spine does elicit pain motor sensory and DTR levels are equal and symmetric in the lower extremities.  Proprioception is intact.  Well-developed well-nourished patient in NAD. HEENT reveals PERLA, EOMI, discs not visualized.  Oral cavity is clear. No oral mucosal lesions are identified. Neck is clear without evidence of cervical or supraclavicular adenopathy. Lungs  are clear to A&P. Cardiac examination is essentially unremarkable with regular rate and rhythm without murmur rub or thrill. Abdomen is benign with no organomegaly or masses noted. Motor sensory and DTR levels are equal and symmetric in the upper and lower extremities. Cranial nerves II through XII are grossly intact. Proprioception is intact. No peripheral adenopathy or edema is identified. No motor or sensory levels are noted. Crude visual fields are within normal range.  RADIOLOGY RESULTS: Bone scan and CT scans reviewed PET CT scan ordered  PLAN: At this time I have ordered a PET CT scan to better delineate the areas of actual activity in the right hip needing palliative radiation.  I am also concerned about the numerous spinal metastasis some of them approaching the spinal canal and PET CT scan will help delineate whether those areas may need some palliative radiation therapy also.  All of this was discussed with the patient and her husband.  I have ordered a CT simulation several days after her PET scan.  Patient comprehends my recommendations well.  I would like to take this opportunity to thank you for allowing me to participate in the care of your patient.SABRA Marcey Penton, MD

## 2024-03-15 NOTE — Procedures (Signed)
 Interventional Radiology Procedure Note  Procedure: US  Guided Biopsy of liver lesion  Complications: None  Estimated Blood Loss: < 10 mL  Findings: 18 G core biopsy of 1.3 cm left lobe liver lesion performed under US  guidance.  Three core samples obtained and sent to Pathology.  Marcey DASEN. Luverne, M.D Pager:  [phone]

## 2024-03-16 ENCOUNTER — Inpatient Hospital Stay

## 2024-03-16 ENCOUNTER — Inpatient Hospital Stay (HOSPITAL_BASED_OUTPATIENT_CLINIC_OR_DEPARTMENT_OTHER): Admitting: Oncology

## 2024-03-16 ENCOUNTER — Encounter: Payer: Self-pay | Admitting: Oncology

## 2024-03-16 VITALS — BP 108/64 | HR 71 | Temp 97.8°F | Resp 18 | Ht 63.0 in | Wt 214.6 lb

## 2024-03-16 DIAGNOSIS — C7951 Secondary malignant neoplasm of bone: Secondary | ICD-10-CM | POA: Insufficient documentation

## 2024-03-16 DIAGNOSIS — Z79899 Other long term (current) drug therapy: Secondary | ICD-10-CM | POA: Diagnosis not present

## 2024-03-16 DIAGNOSIS — Z170 Estrogen receptor positive status [ER+]: Secondary | ICD-10-CM | POA: Insufficient documentation

## 2024-03-16 DIAGNOSIS — Z853 Personal history of malignant neoplasm of breast: Secondary | ICD-10-CM | POA: Insufficient documentation

## 2024-03-16 DIAGNOSIS — Z1732 Human epidermal growth factor receptor 2 negative status: Secondary | ICD-10-CM | POA: Insufficient documentation

## 2024-03-16 DIAGNOSIS — Z5111 Encounter for antineoplastic chemotherapy: Secondary | ICD-10-CM | POA: Insufficient documentation

## 2024-03-16 DIAGNOSIS — C50919 Malignant neoplasm of unspecified site of unspecified female breast: Secondary | ICD-10-CM

## 2024-03-16 DIAGNOSIS — Z510 Encounter for antineoplastic radiation therapy: Secondary | ICD-10-CM | POA: Diagnosis not present

## 2024-03-16 DIAGNOSIS — Z79818 Long term (current) use of other agents affecting estrogen receptors and estrogen levels: Secondary | ICD-10-CM

## 2024-03-16 LAB — CMP (CANCER CENTER ONLY)
ALT: 47 U/L — ABNORMAL HIGH (ref 0–44)
AST: 26 U/L (ref 15–41)
Albumin: 3.1 g/dL — ABNORMAL LOW (ref 3.5–5.0)
Alkaline Phosphatase: 151 U/L — ABNORMAL HIGH (ref 38–126)
Anion gap: 11 (ref 5–15)
BUN: 14 mg/dL (ref 8–23)
CO2: 23 mmol/L (ref 22–32)
Calcium: 8.8 mg/dL — ABNORMAL LOW (ref 8.9–10.3)
Chloride: 106 mmol/L (ref 98–111)
Creatinine: 1.15 mg/dL — ABNORMAL HIGH (ref 0.44–1.00)
GFR, Estimated: 54 mL/min — ABNORMAL LOW (ref >60)
Glucose, Bld: 112 mg/dL — ABNORMAL HIGH (ref 70–99)
Potassium: 4.2 mmol/L (ref 3.5–5.1)
Sodium: 140 mmol/L (ref 135–145)
Total Bilirubin: 0.3 mg/dL (ref 0.0–1.2)
Total Protein: 6.5 g/dL (ref 6.5–8.1)

## 2024-03-16 LAB — CBC WITH DIFFERENTIAL (CANCER CENTER ONLY)
Abs Immature Granulocytes: 0.06 K/uL (ref 0.00–0.07)
Basophils Absolute: 0 K/uL (ref 0.0–0.1)
Basophils Relative: 1 %
Eosinophils Absolute: 0.3 K/uL (ref 0.0–0.5)
Eosinophils Relative: 4 %
HCT: 33.4 % — ABNORMAL LOW (ref 36.0–46.0)
Hemoglobin: 10.1 g/dL — ABNORMAL LOW (ref 12.0–15.0)
Immature Granulocytes: 1 %
Lymphocytes Relative: 19 %
Lymphs Abs: 1.2 K/uL (ref 0.7–4.0)
MCH: 24.9 pg — ABNORMAL LOW (ref 26.0–34.0)
MCHC: 30.2 g/dL (ref 30.0–36.0)
MCV: 82.3 fL (ref 80.0–100.0)
Monocytes Absolute: 0.6 K/uL (ref 0.1–1.0)
Monocytes Relative: 9 %
Neutro Abs: 4.3 K/uL (ref 1.7–7.7)
Neutrophils Relative %: 66 %
Platelet Count: 409 K/uL — ABNORMAL HIGH (ref 150–400)
RBC: 4.06 MIL/uL (ref 3.87–5.11)
RDW: 20.1 % — ABNORMAL HIGH (ref 11.5–15.5)
WBC Count: 6.4 K/uL (ref 4.0–10.5)
nRBC: 0.3 % — ABNORMAL HIGH (ref 0.0–0.2)

## 2024-03-16 LAB — SURGICAL PATHOLOGY

## 2024-03-16 NOTE — Progress Notes (Signed)
 Patient states she's feeling a little better. She also got her liver biopsy done yesterday.

## 2024-03-17 ENCOUNTER — Other Ambulatory Visit: Payer: Self-pay

## 2024-03-17 ENCOUNTER — Ambulatory Visit: Payer: Self-pay | Admitting: Oncology

## 2024-03-17 LAB — CANCER ANTIGEN 27.29: CA 27.29: 414.6 U/mL — ABNORMAL HIGH (ref 0.0–38.6)

## 2024-03-17 NOTE — Progress Notes (Unsigned)
 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  It was a 1.4 cm tumor with negative lymph nodes grade 1 and patient went on to get lumpectomy followed by adjuvant radiation therapy.  Oncotype DX score was 6 and she did not require adjuvant chemotherapy.  She was on tamoxifen for 5 years.  She had BRCA testing done at that time which was negative.   Patient felt a lump in her right axilla in September 2021.  She underwent ultrasound of bilateral breasts which did not pick up any axillary mass.  A prior screening mammogram in September 2021 was unremarkable.  She was then admitted for Covid pneumonia and underwent CT angio chest on 07/22/2020 which incidentally picked up an axillary mass measuring 2.5 cm.  No obvious breast mass.  Axillary mass was biopsied and was consistent with high-grade invasive mammary carcinoma.  IHC was positive for GATA3 with diffuse strong nuclear staining.  There was no lymph node architecture identified in the sample and it could not be determined if it is a primary tumor within the axillary breast tissue or metastases.  ER strongly positive greater than 90%, PR 1 to 10% positive and HER-2 negative- 0   PET CT scan showed hypermetabolic poorly marginated solid right axillary mass 2.5 x 2 cm with an SUV of 5.6.  No  enlarged hypermetabolic mediastinal or hilar lymph nodes no enlarged left axillary lymph nodes.  Subcentimeter lung nodules below PET resolution.  Small hypermetabolism in the right liver with an SUV of 6 without CT correlate equivocal for liver metastases.  Multiple hypermetabolic faintly lytic osseous lesions throughout the lumbar spine and bilateral pelvic girdle with representative supra-acetabular right iliac bone 1.9 cm lesion, anterior superior left acetabular 1.7 cm lesion and L1 1.2 cm lesion.   NGS testing showed no actionable mutations.  PD-L1 less than 1%.  CHEK2 S422 FS, PIK3CA N1068 FS, PIK3CA N345H, CCN E1 gain, ERB B2 1 P-CSF 1R fusion, FGF 19 gain, FGF 3 gain, FGF 4gain.   Ibrance  plus letrozole  started in February 2022.    Patient noted to have increase in CA 27-29 from 26 and November 2023 203 in July 2024.  CT chest abdomen pelvis with contrast and bone scan showed worsening metastatic disease in the bones especially in the thoracolumbar spine and right hemipelvis.    Peripheral blood NGS testing showed CHEK2 mutation, PIK3CA P.N1068 FS, PIK3CA P.N345H, PTEN P.R173S, ESR 1P.D538G, ESR 1P.L536R, PIK3CA P.R88Q.  MSI high not detected  Elacestrant  started in sept 2024.  Disease progression noted in June 2025 and given the presence of PIK3CA mutation patient switched capivasertib  plus fulvestrant     Interval history-patient is tolerating the lower dose of truqap  better.  She still has diarrhea but overall better controlled.  Pain is well-controlled with long-acting morphine  3 times a day and as needed Dilaudid .  She uses about 4 doses of Dilaudid  per  day  ECOG PS- 1 Pain scale- 3 Opioid associated constipation- no  Review of systems- Review of Systems  Constitutional:  Positive for malaise/fatigue. Negative for chills, fever and weight loss.  HENT:  Negative for congestion, ear discharge and nosebleeds.   Eyes:  Negative for blurred vision.  Respiratory:  Negative for cough,  hemoptysis, sputum production, shortness of breath and wheezing.   Cardiovascular:  Negative for chest pain, palpitations, orthopnea and claudication.  Gastrointestinal:  Negative for abdominal pain, blood in stool, constipation, diarrhea, heartburn, melena, nausea and vomiting.  Genitourinary:  Negative for dysuria, flank pain, frequency, hematuria and urgency.  Musculoskeletal:  Negative for back pain, joint pain and myalgias.       Right hip pain  Skin:  Negative for rash.  Neurological:  Negative for dizziness, tingling, focal weakness, seizures, weakness and headaches.  Endo/Heme/Allergies:  Does not bruise/bleed easily.  Psychiatric/Behavioral:  Negative for depression and suicidal ideas. The patient does not have insomnia.       Allergies  Allergen Reactions  . Kiwi Extract Itching and Swelling    The real kiwi fruit  . Codeine Other (See Comments)    Felt funny.  . Food Itching and Swelling    Patient states, it's grapes.  . Amoxicillin Rash  . Erythromycin Rash  . Sulfa  Antibiotics Rash  . Tape Rash    Adhesives  Pt can have paper tape     Past Medical History:  Diagnosis Date  . Allergic genetic state   . Arthritis   . BRCA negative 2004  . Breast cancer (HCC)    2009 infiltrating ductal cancer of right breast  . Breast mass 08/2006, 03/2009   left breast biopsy fibroadenoma (2008) right breast biopsy benign (2010)  . 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 Central serous retinopathy   . 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 Chicken pox   . Depression   . Fatty liver   . Fatty liver   . Gastric polyps   . GERD (gastroesophageal reflux disease)   . Gestational diabetes    prediabetes out side of having baby  . Headache    migraines  . Heart murmur   . History of abnormal mammogram 08/2006, 01/2008, 03/2009  . Hypertension   . IBS (irritable bowel syndrome)   . Joint disorder    hx of left ankle tendonitis, bursitis, heel spur   . 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 Obesity 2018   BMI 45  . Pelvic pain    adhesions and adenomyosis  . Personal history of radiation therapy   . Rosacea      Past Surgical History:  Procedure Laterality Date  . ABDOMINAL HYSTERECTOMY    . BREAST BIOPSY Left 08/2006   benign fibroadenoma  . BREAST BIOPSY Right 08/11/2020   us  bx of LN, hydromarker, Invasive mammary carcinoma  . BREAST EXCISIONAL BIOPSY Right 02/26/2008   right breast invasive mam ca with rad partial mastectomy   . BREAST SURGERY Right    x2/ Dr Claudene  . CARDIAC CATHETERIZATION  2006  . CESAREAN SECTION  04/02/1998  . CHOLECYSTECTOMY  04/2010   Dr. Smith/ laprascopic surgery  . COLONOSCOPY  04/04/2000  . COLONOSCOPY WITH PROPOFOL  N/A 02/12/2019   Procedure: COLONOSCOPY WITH PROPOFOL ;  Surgeon: Gaylyn Gladis PENNER, MD;  Location: [facility] ENDOSCOPY;  Service: Endoscopy;  Laterality: N/A;  . ESOPHAGOGASTRODUODENOSCOPY (  EGD) WITH PROPOFOL  N/A 05/01/2015   Procedure: ESOPHAGOGASTRODUODENOSCOPY (EGD) WITH PROPOFOL ;  Surgeon: Deward CINDERELLA Piedmont, MD;  Location: [facility] ENDOSCOPY;  Service: Gastroenterology;  Laterality: N/A;  . ESOPHAGOGASTRODUODENOSCOPY (EGD) WITH PROPOFOL  N/A 02/12/2019   Procedure: ESOPHAGOGASTRODUODENOSCOPY (EGD) WITH PROPOFOL ;  Surgeon: Gaylyn Gladis PENNER, MD;  Location[facility] ENDOSCOPY;  Service: Endoscopy;  Laterality: N/A;  . EYE SURGERY  08/2012   laser surgery on retina/ DR Appenzeller  . FINGER SURGERY     repair of tendon in right index, Dr. Nancyann in [place]  . FOOT SURGERY     Dr. Verta  . IRRIGATION AND DEBRIDEMENT SEBACEOUS CYST     right upper back  . LAPAROSCOPIC SUPRACERVICAL HYSTERECTOMY  06/2007   Dr Kincius/ adhesions/CPP/adenomyosis  . MASTECTOMY PARTIAL / LUMPECTOMY Right 01/2008   with sentinal lymph node biopsy/ infiltrating ductal cancer  . REPAIR PERONEAL TENDONS ANKLE  10/2019   with tarsal exostectomy and sural neuroma  excision on right   . SKIN CANCER EXCISION     back and calves  . WISDOM TOOTH EXTRACTION  1991    Social History   Socioeconomic History  . Marital status: Married    Spouse name: Marcey  . Number of children: 1  . Years of education: 56  . Highest education level: Not on file  Occupational History  . Occupation: CMA  Tobacco Use  . Smoking status: Never  . Smokeless tobacco: Never  Vaping Use  . Vaping status: Never Used  Substance and Sexual Activity  . Alcohol use: Yes    Alcohol/week: 0.0 standard drinks of alcohol    Comment: rare, 1-2 drinks per year  . Drug use: No  . Sexual activity: Yes    Partners: Male    Birth control/protection: Surgical    Comment: Hysterectomy   Other Topics Concern  . Not on file  Social History Narrative   Lives in [place] with husband, no pets.  Works at [employer].      Diet - regular   Exercise - occasional   Social Drivers of Health   Financial Resource Strain: Low Risk  (01/16/2024)   Received from [facility]   Overall Financial Resource Strain (CARDIA)   . Difficulty of Paying Living Expenses: Not hard at all  Food Insecurity: No Food Insecurity (01/16/2024)   Received from [facility]   Hunger Vital Sign   . Within the past 12 months, you worried that your food would run out before you got the money to buy more.: Never true   . Within the past 12 months, the food you bought just didn't last and you didn't have money to get more.: Never true  Transportation Needs: No Transportation Needs (01/16/2024)   Received from [facility]   [facility] - Transportation   . In the past 12 months, has lack of transportation kept you from medical appointments or from getting medications?: No   . Lack of Transportation (Non-Medical): No  Physical Activity: Insufficiently Active (07/27/2017)   Exercise Vital Sign   . Days of Exercise per Week: 4 days   . Minutes of Exercise per Session: 30 min   Stress: No Stress Concern Present (07/27/2017)   [employer] of Occupational Health - Occupational Stress Questionnaire   . Feeling of Stress : Only a little  Social Connections: Socially Integrated (07/27/2017)   Social Connection and Isolation Panel   . Frequency of Communication with Friends and Family: More than three times a  week   . Frequency of Social Gatherings with Friends and Family: Once a week   . Attends Religious Services: More than 4 times per year   . Active Member of Clubs or Organizations: Yes   . Attends Banker Meetings: More than 4 times per year   . Marital Status: Married  Catering manager Violence: Not At Risk (07/27/2017)   Humiliation, Afraid, Rape, and Kick questionnaire   . Fear of Current or Ex-Partner: No   . Emotionally Abused: No   . Physically Abused: No   . Sexually Abused: No    Family History  Problem Relation Age of Onset  . Hypertension Mother   . Thyroid  disease Mother   . Breast cancer Mother 94  . Colon cancer Mother 29       again at 5  . Atrial fibrillation Mother   . Hip fracture Mother   . Skin cancer Mother   . Stroke Father   . Hypertension Father   . Cancer Father 3       prostate  . Parkinson's disease Father   . Skin cancer Father   . Atrial fibrillation Sister        Pacemaker inserted end of Sept-beginning of Oct. 2024  . Hypertension Sister   . Thyroid  disease Sister   . Cancer Sister        skin/ squamous cell  . Heart Problems Sister   . Diabetes Maternal Aunt   . Cancer Maternal Aunt   . Breast cancer Maternal Aunt 75  . Lymphoma Maternal Uncle   . Heart disease Maternal Uncle   . Melanoma Maternal Uncle 16  . Lung cancer Paternal Aunt 35       smoker  . Diabetes Maternal Grandmother   . Stroke Maternal Grandfather   . Heart disease Paternal Grandfather   . Thyroid  cancer Cousin        maternal  . Breast cancer Cousin 60       maternal  . Breast cancer Cousin 63  . Colon cancer Cousin       Current Outpatient Medications:  .  aspirin EC 81 MG tablet, Take by mouth., Disp: , Rfl:  .  b complex vitamins capsule, Take 1 capsule by mouth daily., Disp: , Rfl:  .  Blood Glucose Monitoring Suppl (BLOOD GLUCOSE MONITOR SYSTEM) w/Device KIT, Use to test blood sugars in the morning, at noon, and at bedtime., Disp: 1 kit, Rfl: 0 .  buPROPion  (WELLBUTRIN  XL) 150 MG 24 hr tablet, Take 1 tablet (150 mg total) by mouth daily. Take along a 300mg  tablet for a total daily dose of 450mg ., Disp: 90 tablet, Rfl: 3 .  buPROPion  (WELLBUTRIN  XL) 300 MG 24 hr tablet, Take 1 tablet (300 mg total) by mouth daily., Disp: 90 tablet, Rfl: 3 .  busPIRone  (BUSPAR ) 10 MG tablet, Take 1 tablet (10 mg total) by mouth 2 (two) times daily, Disp: 180 tablet, Rfl: 1 .  calcium carbonate (OSCAL) 1500 (600 Ca) MG TABS tablet, Take by mouth 2 (two) times daily with a meal., Disp: , Rfl:  .  capivasertib  (TRUQAP ) 160 MG pack, Take 2 tablets (320 mg total) by mouth 2 (two) times daily. Take for 4 days, then hold for 3 days. Repeat every 7 days., Disp: 64 tablet, Rfl: 0 .  Cholecalciferol (VITAMIN D3) 10 MCG (400 UNIT) CAPS, Take by mouth., Disp: , Rfl:  .  citalopram  (CELEXA ) 40 MG tablet, Take 1 tablet (40 mg total)  by mouth daily., Disp: 90 tablet, Rfl: 3 .  diphenhydrAMINE  (BENADRYL ) 25 MG tablet, Take 25 mg by mouth at bedtime as needed for sleep. Pt states daily now for sleep and allergies., Disp: , Rfl:  .  gabapentin  (NEURONTIN ) 100 MG capsule, Take 1 capsule (100 mg total) by mouth every morning AND 2 capsules (200 mg total) every evening., Disp: 270 capsule, Rfl: 1 .  HYDROmorphone  (DILAUDID ) 2 MG tablet, Take 2 tablets (4 mg total) by mouth every 6 (six) hours as needed for severe pain (pain score 7-10)., Disp: , Rfl:  .  ketotifen  (ZADITOR ) 0.025 % ophthalmic solution, Place 1 drop into both eyes daily., Disp: , Rfl:  .  Magnesium Glycinate 100 MG CAPS, , Disp: , Rfl:  .  metFORMIN  (GLUCOPHAGE -XR) 500 MG 24 hr  tablet, Take 1 tablet (500 mg total) by mouth 2 (two) times daily with a meal., Disp: 180 tablet, Rfl: 1 .  morphine  (MS CONTIN ) 30 MG 12 hr tablet, Take 1 tablet (30 mg total) by mouth every 8 (eight) hours., Disp: , Rfl:  .  Multiple Vitamin (MULTIVITAMIN) tablet, Take 1 tablet by mouth daily., Disp: , Rfl:  .  omeprazole  (PRILOSEC) 20 MG capsule, Take 1 capsule (20 mg total) by mouth daily., Disp: 90 capsule, Rfl: 3 .  ondansetron  (ZOFRAN ) 4 MG tablet, Take 1 tablet (4 mg total) by mouth every 8 (eight) hours as needed for nausea or vomiting., Disp: 90 tablet, Rfl: 0 .  ondansetron  (ZOFRAN ) 8 MG tablet, Take 1 tablet (8 mg total) by mouth every 8 (eight) hours as needed for nausea or vomiting., Disp: 30 tablet, Rfl: 1 .  prochlorperazine  (COMPAZINE ) 10 MG tablet, Take 1 tablet (10 mg total) by mouth every 6 (six) hours as needed for nausea or vomiting., Disp: 30 tablet, Rfl: 1 .  diphenoxylate -atropine  (LOMOTIL ) 2.5-0.025 MG tablet, Take 2 tablets by mouth 4 (four) times daily as needed for diarrhea or loose stools. (Patient not taking: Reported on 03/15/2024), Disp: 60 tablet, Rfl: 0 .  loperamide  (IMODIUM ) 2 MG capsule, Take 2 tabs by mouth with first loose stool, then 1 tab with each additional loose stool as needed. Do not exceed 8 tabs in a 24-hour period (Patient not taking: Reported on 03/15/2024), Disp: 30 capsule, Rfl: 0  Physical exam:  Vitals:   03/16/24 1500  BP: 108/64  Pulse: 71  Resp: 18  Temp: 97.8 F (36.6 C)  TempSrc: Tympanic  SpO2: 100%  Weight: 214 lb 9.6 oz (97.3 kg)  Height: 5' 3 (1.6 m)   Physical Exam Cardiovascular:     Rate and Rhythm: Normal rate and regular rhythm.     Heart sounds: Normal heart sounds.  Pulmonary:     Effort: Pulmonary effort is normal.     Breath sounds: Normal breath sounds.  Skin:    General: Skin is warm and dry.  Neurological:     Mental Status: She is alert and oriented to person, place, and time.      I have personally  reviewed labs listed below:    Latest Ref Rng & Units 03/16/2024    2:40 PM  CMP  Glucose 70 - 99 mg/dL 887   BUN 8 - 23 mg/dL 14   Creatinine 9.55 - 1.00 mg/dL 8.84   Sodium 864 - 854 mmol/L 140   Potassium 3.5 - 5.1 mmol/L 4.2   Chloride 98 - 111 mmol/L 106   CO2 22 - 32 mmol/L 23   Calcium 8.9 -  10.3 mg/dL 8.8   Total Protein 6.5 - 8.1 g/dL 6.5   Total Bilirubin 0.0 - 1.2 mg/dL 0.3   Alkaline Phos 38 - 126 U/L 151   AST 15 - 41 U/L 26   ALT 0 - 44 U/L 47       Latest Ref Rng & Units 03/16/2024    2:40 PM  CBC  WBC 4.0 - 10.5 K/uL 6.4   Hemoglobin 12.0 - 15.0 g/dL 89.8   Hematocrit 63.9 - 46.0 % 33.4   Platelets 150 - 400 K/uL 409    I have personally reviewed Radiology images listed below: No images are attached to the encounter.  US  BIOPSY (LIVER) Result Date: 03/15/2024 INDICATION: Breast carcinoma and liver lesions. EXAM: ULTRASOUND GUIDED CORE BIOPSY OF LIVER MASS MEDICATIONS: None. ANESTHESIA/SEDATION: Moderate (conscious) sedation was employed during this procedure. A total of Versed  1.5 mg and Fentanyl  75 mcg was administered intravenously. Moderate Sedation Time: 14 minutes. The patient's level of consciousness and vital signs were monitored continuously by radiology nursing throughout the procedure under my direct supervision. PROCEDURE: The procedure, risks, benefits, and alternatives were explained to the patient. Questions regarding the procedure were encouraged and answered. The patient understands and consents to the procedure. A time out was performed prior to initiating the procedure. The abdominal wall was prepped with chlorhexidine in a sterile fashion, and a sterile drape was applied covering the operative field. A sterile gown and sterile gloves were used for the procedure. Local anesthesia was provided with 1% Lidocaine . Liver lesions were localized by ultrasound. A lesion in the left lobe stressing for sampling. Under ultrasound guidance, a 17 gauge trocar needle  was advanced into the left lobe of the liver. Three separate coaxial 18 gauge core biopsy samples were obtained. Core biopsy samples were submitted in formalin. A slurry of Gel-Foam EmboCubes mixed in sterile saline was injected through the outer needle as the needle was retracted and removed. COMPLICATIONS: None immediate. FINDINGS: Several small hypoechoic rounded lesions are seen in the liver parenchyma. The largest are in the left lobe. A roughly 1.3 cm lesion was chosen for sampling in the lateral left lobe. Solid tissue samples were obtained. IMPRESSION: Ultrasound-guided core biopsy performed of a left lobe liver lesion measuring approximately 1.3 cm. Electronically Signed   By: Marcey Moan M.D.   On: 03/15/2024 10:14   DG HIP UNILAT W OR W/O PELVIS 2-3 VIEWS RIGHT Result Date: 03/07/2024 CLINICAL DATA:  hip pain- known bone metastases- eval for fracture EXAM: DG HIP (WITH OR WITHOUT PELVIS) 2-3V RIGHT COMPARISON:  None Available. FINDINGS: Pelvis is intact with normal and symmetric sacroiliac joints. No acute fracture or dislocation. There are heterogeneous predominantly sclerotic lesions mainly overlying the right iliac vein, likely corresponds to provided history of bone metastases. Visualized sacral arcuate lines are unremarkable. Unremarkable symphysis pubis. There are mild degenerative changes of bilateral hip joints without significant joint space narrowing. Osteophytosis of the superior acetabulum. No radiopaque foreign bodies. IMPRESSION: *No acute fracture or dislocation. *Heterogeneous predominantly sclerotic lesions mainly overlying the right iliac vein, likely corresponds to provided history of bone metastases. Electronically Signed   By: Ree Molt M.D.   On: 03/07/2024 17:04   MR LIVER W WO CONTRAST Result Date: 03/02/2024 CLINICAL DATA:  liver metastases; breast cancer. EXAM: MRI ABDOMEN WITHOUT AND WITH CONTRAST TECHNIQUE: Multiplanar multisequence MR imaging of the abdomen was  performed both before and after the administration of intravenous contrast. CONTRAST:  9mL GADAVIST  GADOBUTROL  1 MMOL/ML IV  SOLN COMPARISON:  CT scan chest, abdomen and pelvis from 02/08/2024. FINDINGS: Lower chest: Unremarkable MR appearance to the lung bases. No pleural effusion. No pericardial effusion. Normal heart size. Hepatobiliary: The liver is normal in size. Noncirrhotic configuration. There are approximately 15, T1 hypointense and T2 mildly hyperintense, lesions throughout the liver with largest in the left hepatic lobe, segment 2 measuring up to 1.1 x 1.1 cm. The lesions exhibit diffuse solid or target like enhancement on the postcontrast images and are compatible with metastases. The portal vein and hepatic veins are patent. No intrahepatic or extrahepatic bile duct dilatation. No choledocholithiasis. Status post cholecystectomy. Pancreas: No mass, inflammatory changes or other parenchymal abnormality identified. No main pancreatic duct dilation. Spleen:  Within normal limits in size and appearance. No focal mass. Adrenals/Urinary Tract: Unremarkable adrenal glands. No hydroureteronephrosis on either side. There are several simple cysts in the left kidney with largest arising from the upper pole, laterally measuring 1.1 x 1.2 cm. Stomach/Bowel: There is tiny sliding hiatal hernia. Visualized portions within the abdomen are unremarkable. No disproportionate dilation of bowel loops. Vascular/Lymphatic: No pathologically enlarged lymph nodes identified. No abdominal aortic aneurysm demonstrated. No ascites. Other:  None. Musculoskeletal: There are multiple heterogeneous rim enhancing lesions in the visualized thoracolumbar spine and pelvic bones, compatible with metastases. No pathological fracture seen within the limitations of this exam. IMPRESSION: 1. There are multiple liver metastases, as described above. 2. There are also multiple metastases in the visualized thoracolumbar spine and pelvic bones. 3.  Multiple other nonacute observations, as described above. Electronically Signed   By: Ree Molt M.D.   On: 03/02/2024 11:20     Assessment and plan- Patient is a 63 y.o. female with history of metastatic breast cancer ER positive PR positive HER2 negative with bone only metastases presently on Faslodex  plus capivasertib . She is here for routine f/u  Patient was found to have multiple areas of liver metastases on MRI liver in August 2025.  These were not seen on CT scan in May 2025 but were just about picked up on CT scans in July 2025.  Patient was started on capivasertib  plus Faslodex  in june 2025.      Visit Diagnosis 1. Primary malignant neoplasm of breast with metastasis (HCC)   2. High risk medication use   3. Use of fulvestrant  (Faslodex )      Dr. Annah Skene, MD, MPH [facility] at [facility] [phone] 03/17/2024 7:12 PM

## 2024-03-18 ENCOUNTER — Encounter: Payer: Self-pay | Admitting: Oncology

## 2024-03-19 ENCOUNTER — Telehealth: Payer: Self-pay | Admitting: *Deleted

## 2024-03-19 ENCOUNTER — Encounter: Payer: Self-pay | Admitting: *Deleted

## 2024-03-19 ENCOUNTER — Ambulatory Visit
Admission: RE | Admit: 2024-03-19 | Discharge: 2024-03-19 | Disposition: A | Discharge status: Home / Self Care | Source: Ambulatory Visit | Attending: Radiation Oncology | Admitting: Radiation Oncology

## 2024-03-19 DIAGNOSIS — Z7969 Long term (current) use of other immunomodulators and immunosuppressants: Secondary | ICD-10-CM | POA: Diagnosis not present

## 2024-03-19 DIAGNOSIS — Z853 Personal history of malignant neoplasm of breast: Secondary | ICD-10-CM | POA: Insufficient documentation

## 2024-03-19 DIAGNOSIS — Z923 Personal history of irradiation: Secondary | ICD-10-CM | POA: Diagnosis not present

## 2024-03-19 DIAGNOSIS — C7951 Secondary malignant neoplasm of bone: Secondary | ICD-10-CM | POA: Diagnosis present

## 2024-03-19 DIAGNOSIS — E119 Type 2 diabetes mellitus without complications: Secondary | ICD-10-CM | POA: Diagnosis not present

## 2024-03-19 LAB — GLUCOSE, CAPILLARY: Glucose-Capillary: 105 mg/dL — ABNORMAL HIGH (ref 70–99)

## 2024-03-19 MED ORDER — FLUDEOXYGLUCOSE F - 18 (FDG) INJECTION
11.4900 | Freq: Once | INTRAVENOUS | Status: AC | PRN
  Administered 2024-03-19: 11.49 via INTRAVENOUS

## 2024-03-19 NOTE — Progress Notes (Addendum)
 Tempus testing ordered on liver biopsy specimen from 03/15/24. Financial assistance document completed.   Approved for $0 out of pocket cost

## 2024-03-23 ENCOUNTER — Encounter: Payer: Self-pay | Admitting: Oncology

## 2024-03-26 ENCOUNTER — Encounter: Payer: Self-pay | Admitting: Oncology

## 2024-03-26 ENCOUNTER — Ambulatory Visit
Admission: RE | Admit: 2024-03-26 | Discharge: 2024-03-26 | Disposition: A | Discharge status: Home / Self Care | Source: Ambulatory Visit | Attending: Radiation Oncology | Admitting: Radiation Oncology

## 2024-03-26 DIAGNOSIS — Z510 Encounter for antineoplastic radiation therapy: Secondary | ICD-10-CM | POA: Diagnosis not present

## 2024-03-26 MED ORDER — NITROFURANTOIN MONOHYD MACRO 100 MG PO CAPS: 100.0000 mg | ORAL_CAPSULE | Freq: Two times a day (BID) | ORAL | 0 refills | Status: AC

## 2024-03-26 NOTE — Telephone Encounter (Signed)
 Per Dr. Melanee ok to send macrobid  for 7 days without UA.

## 2024-03-27 ENCOUNTER — Encounter (INDEPENDENT_AMBULATORY_CARE_PROVIDER_SITE_OTHER): Payer: Self-pay

## 2024-03-27 ENCOUNTER — Other Ambulatory Visit: Payer: Self-pay | Admitting: Oncology

## 2024-03-27 ENCOUNTER — Ambulatory Visit

## 2024-03-27 ENCOUNTER — Encounter: Payer: Self-pay | Admitting: Oncology

## 2024-03-27 ENCOUNTER — Inpatient Hospital Stay

## 2024-03-27 ENCOUNTER — Other Ambulatory Visit (HOSPITAL_COMMUNITY): Payer: Self-pay

## 2024-03-27 VITALS — BP 124/73 | HR 65 | Temp 97.0°F | Resp 18

## 2024-03-27 DIAGNOSIS — Z510 Encounter for antineoplastic radiation therapy: Secondary | ICD-10-CM | POA: Diagnosis not present

## 2024-03-27 DIAGNOSIS — C50919 Malignant neoplasm of unspecified site of unspecified female breast: Secondary | ICD-10-CM

## 2024-03-27 DIAGNOSIS — C7951 Secondary malignant neoplasm of bone: Secondary | ICD-10-CM

## 2024-03-27 LAB — CMP (CANCER CENTER ONLY)
ALT: 24 U/L (ref 0–44)
AST: 26 U/L (ref 15–41)
Albumin: 3.3 g/dL — ABNORMAL LOW (ref 3.5–5.0)
Alkaline Phosphatase: 137 U/L — ABNORMAL HIGH (ref 38–126)
Anion gap: 9 (ref 5–15)
BUN: 20 mg/dL (ref 8–23)
CO2: 24 mmol/L (ref 22–32)
Calcium: 9.2 mg/dL (ref 8.9–10.3)
Chloride: 104 mmol/L (ref 98–111)
Creatinine: 1.36 mg/dL — ABNORMAL HIGH (ref 0.44–1.00)
GFR, Estimated: 44 mL/min — ABNORMAL LOW (ref >60)
Glucose, Bld: 131 mg/dL — ABNORMAL HIGH (ref 70–99)
Potassium: 4.4 mmol/L (ref 3.5–5.1)
Sodium: 137 mmol/L (ref 135–145)
Total Bilirubin: 0.5 mg/dL (ref 0.0–1.2)
Total Protein: 6.9 g/dL (ref 6.5–8.1)

## 2024-03-27 MED ORDER — ZOLEDRONIC ACID 4 MG/5ML IV CONC
3.0000 mg | INTRAVENOUS | Status: DC
  Administered 2024-03-27: 3 mg via INTRAVENOUS
  Filled 2024-03-27: qty 3.75

## 2024-03-27 MED ORDER — FULVESTRANT 250 MG/5ML IM SOSY
500.0000 mg | PREFILLED_SYRINGE | Freq: Once | INTRAMUSCULAR | Status: AC
  Administered 2024-03-27: 500 mg via INTRAMUSCULAR
  Filled 2024-03-27: qty 10

## 2024-03-27 MED ORDER — SODIUM CHLORIDE 0.9 % IV SOLN
INTRAVENOUS | Status: DC
  Filled 2024-03-27: qty 250

## 2024-03-27 NOTE — Patient Instructions (Addendum)
 Fulvestrant Injection What is this medication? FULVESTRANT (ful VES trant) treats breast cancer. It works by blocking the hormone estrogen in breast tissue, which prevents breast cancer cells from spreading or growing. This medicine may be used for other purposes; ask your health care provider or pharmacist if you have questions. COMMON BRAND NAME(S): FASLODEX What should I tell my care team before I take this medication? They need to know if you have any of these conditions: Bleeding disorder Liver disease Low blood cell levels (white cells, red cells, and platelets) An unusual or allergic reaction to fulvestrant, other medications, foods, dyes, or preservatives Pregnant or trying to get pregnant Breastfeeding How should I use this medication? This medication is injected into a muscle. It is given by your care team in a hospital or clinic setting. Talk to your care team about the use of this medication in children. Special care may be needed. Overdosage: If you think you have taken too much of this medicine contact a poison control center or emergency room at once. NOTE: This medicine is only for you. Do not share this medicine with others. What if I miss a dose? Keep appointments for follow-up doses. It is important not to miss your dose. Call your care team if you are unable to keep an appointment. What may interact with this medication? Fluoroestradiol F18 This list may not describe all possible interactions. Give your health care provider a list of all the medicines, herbs, non-prescription drugs, or dietary supplements you use. Also tell them if you smoke, drink alcohol, or use illegal drugs. Some items may interact with your medicine. What should I watch for while using this medication? Your condition will be monitored carefully while you are receiving this medication. You may need blood work done while you are taking this medication. This medication is injected into a muscle. Talk  to your care team if you also take medications that prevent or treat blood clots, such as warfarin. Blood thinners may increase the risk of bleeding or bruising in the muscle where this medication is injected. The benefits of this medication may outweigh the risks. Your care team can help you find the option that works for you. They can also help limit the risk of bleeding. Talk to your care team if you may be pregnant. Serious birth defects can occur if you take this medication during pregnancy and for 1 year after the last dose. You will need a negative pregnancy test before starting this medication. Contraception is recommended while taking this medication and for 1 year after the last dose. Your care team can help you find the option that works for you. Do not breastfeed while taking this medication and for 1 year after the last dose. This medication may cause infertility. Talk to your care team if you are concerned about your fertility. What side effects may I notice from receiving this medication? Side effects that you should report to your care team as soon as possible: Allergic reactions or angioedema--skin rash, itching or hives, swelling of the face, eyes, lips, tongue, arms, or legs, trouble swallowing or breathing Pain, tingling, or numbness in the hands or feet Side effects that usually do not require medical attention (report to your care team if they continue or are bothersome): Bone, joint, or muscle pain Constipation Headache Hot flashes Nausea Pain, redness, or irritation at injection site Unusual weakness or fatigue This list may not describe all possible side effects. Call your doctor for medical advice about side  effects. You may report side effects to FDA at 1-800-FDA-1088. Where should I keep my medication? This medication is given in a hospital or clinic. It will not be stored at home. NOTE: This sheet is a summary. It may not cover all possible information. If you have  questions about this medicine, talk to your doctor, pharmacist, or health care provider.  2024 Elsevier/Gold Standard (2023-03-04 00:00:00)

## 2024-03-28 ENCOUNTER — Other Ambulatory Visit: Payer: Self-pay

## 2024-03-28 ENCOUNTER — Encounter: Payer: Self-pay | Admitting: Oncology

## 2024-03-28 ENCOUNTER — Other Ambulatory Visit (HOSPITAL_COMMUNITY): Payer: Self-pay

## 2024-03-28 ENCOUNTER — Other Ambulatory Visit: Payer: Self-pay | Admitting: *Deleted

## 2024-03-28 DIAGNOSIS — C7951 Secondary malignant neoplasm of bone: Secondary | ICD-10-CM

## 2024-03-28 MED ORDER — BUSPIRONE HCL 10 MG PO TABS
10.0000 mg | ORAL_TABLET | Freq: Two times a day (BID) | ORAL | 1 refills | Status: AC
  Filled 2024-03-28: qty 60, 30d supply, fill #0
  Filled 2024-04-20 – 2024-04-23 (×2): qty 60, 30d supply, fill #1
  Filled 2024-05-20: qty 60, 30d supply, fill #2
  Filled 2024-06-03 – 2024-06-19 (×2): qty 60, 30d supply, fill #3
  Filled 2024-07-15: qty 60, 30d supply, fill #4
  Filled 2024-08-12: qty 60, 30d supply, fill #5

## 2024-03-28 MED ORDER — CAPIVASERTIB 160 MG PO TBPK
320.0000 mg | ORAL_TABLET | Freq: Two times a day (BID) | ORAL | 0 refills | Status: DC
  Filled 2024-03-28: qty 64, 28d supply, fill #0

## 2024-03-28 NOTE — Progress Notes (Signed)
 Specialty Pharmacy Refill Coordination Note  Jacqueline Deleon is a 63 y.o. female contacted today regarding refills of specialty medication(s) Capivasertib  (TRUQAP )   Patient requested (Patient-Rptd) Delivery   Delivery date: 04/04/24   Verified address: [address] [place], [place]   Medication will be filled on 04/03/24.

## 2024-03-29 ENCOUNTER — Ambulatory Visit

## 2024-03-29 ENCOUNTER — Encounter: Payer: Self-pay | Admitting: Oncology

## 2024-03-30 DIAGNOSIS — Z510 Encounter for antineoplastic radiation therapy: Secondary | ICD-10-CM | POA: Diagnosis not present

## 2024-03-30 MED ORDER — MORPHINE SULFATE ER 30 MG PO TBCR: 30.0000 mg | EXTENDED_RELEASE_TABLET | Freq: Three times a day (TID) | ORAL | 0 refills | Status: DC

## 2024-03-30 MED ORDER — HYDROMORPHONE HCL 2 MG PO TABS: 4.0000 mg | ORAL_TABLET | Freq: Four times a day (QID) | ORAL | 0 refills | Status: DC | PRN

## 2024-03-30 NOTE — Addendum Note (Signed)
 Addended by: Leanne Sisler on: 03/30/2024 09:34 AM   Modules accepted: Orders

## 2024-03-30 NOTE — Telephone Encounter (Addendum)
 Per my chart message received earlier today Hey,  I thought I was told to take two every 8 hours.  Actually, I haven't been taking it in the middle of the day here lately.  Do I need to have labs done.  on the 22nd?  I just had labs drawn earlier this week.  Last Dilaudid  prescribed 03/07/24, last Morphine  prescribed 03/07/24; on the otherhand last Oxy prescribed 12/23/23.  Outbound call to patient to confirm what medications she's been taking. Reviewed the above and patient confirmed she needs a refill on both Dilaudid  and Morphine .  Also reviewed upcoming appointment for 9/22 for labs prior to radiation visit; patient verbalized understanding.

## 2024-04-02 ENCOUNTER — Other Ambulatory Visit

## 2024-04-02 ENCOUNTER — Ambulatory Visit

## 2024-04-02 ENCOUNTER — Ambulatory Visit
Admission: RE | Admit: 2024-04-02 | Discharge: 2024-04-02 | Disposition: A | Discharge status: Home / Self Care | Source: Ambulatory Visit | Attending: Radiation Oncology | Admitting: Radiation Oncology

## 2024-04-02 DIAGNOSIS — Z510 Encounter for antineoplastic radiation therapy: Secondary | ICD-10-CM | POA: Diagnosis not present

## 2024-04-03 ENCOUNTER — Ambulatory Visit
Admission: RE | Admit: 2024-04-03 | Discharge: 2024-04-03 | Discharge status: Home / Self Care | Source: Ambulatory Visit | Attending: Radiation Oncology | Admitting: Radiation Oncology

## 2024-04-03 ENCOUNTER — Telehealth: Payer: Self-pay

## 2024-04-03 ENCOUNTER — Inpatient Hospital Stay

## 2024-04-03 ENCOUNTER — Other Ambulatory Visit: Payer: Self-pay

## 2024-04-03 DIAGNOSIS — Z510 Encounter for antineoplastic radiation therapy: Secondary | ICD-10-CM | POA: Diagnosis not present

## 2024-04-03 LAB — RAD ONC ARIA SESSION SUMMARY
Course Elapsed Days: 0
Plan Fractions Treated to Date: 1
Plan Prescribed Dose Per Fraction: 3 Gy
Plan Total Fractions Prescribed: 10
Plan Total Prescribed Dose: 30 Gy
Reference Point Dosage Given to Date: 3 Gy
Reference Point Session Dosage Given: 3 Gy
Session Number: 1

## 2024-04-03 MED ORDER — HYDROMORPHONE HCL 2 MG PO TABS: 4.0000 mg | ORAL_TABLET | Freq: Four times a day (QID) | ORAL | 0 refills | Status: DC | PRN

## 2024-04-03 NOTE — Telephone Encounter (Signed)
 Received fax from [facility] today 04/03/24 indicating Hydromorphone  product backordered / unavailable.  Outbound call; spoke to [place] who indicated the Hydromorphone  2mg  tablet is not in stock; [facility] [address] [phone].

## 2024-04-03 NOTE — Telephone Encounter (Signed)
 Sp[facility] [place] who cancelled the order for the [place] location.

## 2024-04-04 ENCOUNTER — Inpatient Hospital Stay

## 2024-04-04 ENCOUNTER — Ambulatory Visit
Admission: RE | Admit: 2024-04-04 | Discharge: 2024-04-04 | Discharge status: Home / Self Care | Source: Ambulatory Visit | Attending: Radiation Oncology | Admitting: Radiation Oncology

## 2024-04-04 ENCOUNTER — Other Ambulatory Visit: Payer: Self-pay

## 2024-04-04 DIAGNOSIS — Z510 Encounter for antineoplastic radiation therapy: Secondary | ICD-10-CM | POA: Diagnosis not present

## 2024-04-04 LAB — RAD ONC ARIA SESSION SUMMARY
Course Elapsed Days: 1
Plan Fractions Treated to Date: 2
Plan Prescribed Dose Per Fraction: 3 Gy
Plan Total Fractions Prescribed: 10
Plan Total Prescribed Dose: 30 Gy
Reference Point Dosage Given to Date: 6 Gy
Reference Point Session Dosage Given: 3 Gy
Session Number: 2

## 2024-04-05 ENCOUNTER — Other Ambulatory Visit: Payer: Self-pay

## 2024-04-05 ENCOUNTER — Ambulatory Visit
Admission: RE | Admit: 2024-04-05 | Discharge: 2024-04-05 | Disposition: A | Discharge status: Home / Self Care | Source: Ambulatory Visit | Attending: Radiation Oncology | Admitting: Radiation Oncology

## 2024-04-05 ENCOUNTER — Other Ambulatory Visit: Payer: Self-pay | Admitting: *Deleted

## 2024-04-05 ENCOUNTER — Inpatient Hospital Stay

## 2024-04-05 DIAGNOSIS — C7951 Secondary malignant neoplasm of bone: Secondary | ICD-10-CM

## 2024-04-05 DIAGNOSIS — Z510 Encounter for antineoplastic radiation therapy: Secondary | ICD-10-CM | POA: Diagnosis not present

## 2024-04-05 LAB — RAD ONC ARIA SESSION SUMMARY
Course Elapsed Days: 2
Plan Fractions Treated to Date: 3
Plan Prescribed Dose Per Fraction: 3 Gy
Plan Total Fractions Prescribed: 10
Plan Total Prescribed Dose: 30 Gy
Reference Point Dosage Given to Date: 9 Gy
Reference Point Session Dosage Given: 3 Gy
Session Number: 3

## 2024-04-05 LAB — CBC (CANCER CENTER ONLY)
HCT: 32.6 % — ABNORMAL LOW (ref 36.0–46.0)
Hemoglobin: 10 g/dL — ABNORMAL LOW (ref 12.0–15.0)
MCH: 24.9 pg — ABNORMAL LOW (ref 26.0–34.0)
MCHC: 30.7 g/dL (ref 30.0–36.0)
MCV: 81.1 fL (ref 80.0–100.0)
Platelet Count: 206 K/uL (ref 150–400)
RBC: 4.02 MIL/uL (ref 3.87–5.11)
RDW: 20.6 % — ABNORMAL HIGH (ref 11.5–15.5)
WBC Count: 6 K/uL (ref 4.0–10.5)
nRBC: 0 % (ref 0.0–0.2)

## 2024-04-05 NOTE — Progress Notes (Signed)
 Specialty Pharmacy Ongoing Clinical Assessment Note  Jacqueline Deleon is a 63 y.o. female who is being followed by the specialty pharmacy service for RxSp Oncology   Patient's specialty medication(s) reviewed today: Capivasertib  (TRUQAP )   Missed doses in the last 4 weeks: 0   Patient/Caregiver did not have any additional questions or concerns.   Therapeutic benefit summary: Unable to assess   Adverse events/side effects summary: Experienced adverse events/side effects (diarrhea, still not well-controlled with OTC, and having urinary frequency. Pt is documenting and will talk to provider at next appt.)   Patient's therapy is appropriate to: Continue    Goals Addressed   None     Follow up: 3 months  [facility] Specialty Pharmacist

## 2024-04-06 ENCOUNTER — Other Ambulatory Visit: Payer: Self-pay

## 2024-04-06 ENCOUNTER — Ambulatory Visit
Admission: RE | Admit: 2024-04-06 | Discharge: 2024-04-06 | Disposition: A | Discharge status: Home / Self Care | Source: Ambulatory Visit | Attending: Radiation Oncology | Admitting: Radiation Oncology

## 2024-04-06 DIAGNOSIS — Z510 Encounter for antineoplastic radiation therapy: Secondary | ICD-10-CM | POA: Diagnosis not present

## 2024-04-06 LAB — RAD ONC ARIA SESSION SUMMARY
Course Elapsed Days: 3
Plan Fractions Treated to Date: 4
Plan Prescribed Dose Per Fraction: 3 Gy
Plan Total Fractions Prescribed: 10
Plan Total Prescribed Dose: 30 Gy
Reference Point Dosage Given to Date: 12 Gy
Reference Point Session Dosage Given: 3 Gy
Session Number: 4

## 2024-04-09 ENCOUNTER — Other Ambulatory Visit

## 2024-04-09 ENCOUNTER — Ambulatory Visit
Admission: RE | Admit: 2024-04-09 | Discharge: 2024-04-09 | Disposition: A | Source: Ambulatory Visit | Attending: Radiation Oncology | Admitting: Radiation Oncology

## 2024-04-09 ENCOUNTER — Other Ambulatory Visit: Payer: Self-pay

## 2024-04-09 DIAGNOSIS — Z510 Encounter for antineoplastic radiation therapy: Secondary | ICD-10-CM | POA: Diagnosis not present

## 2024-04-09 LAB — RAD ONC ARIA SESSION SUMMARY
Course Elapsed Days: 6
Plan Fractions Treated to Date: 5
Plan Prescribed Dose Per Fraction: 3 Gy
Plan Total Fractions Prescribed: 10
Plan Total Prescribed Dose: 30 Gy
Reference Point Dosage Given to Date: 15 Gy
Reference Point Session Dosage Given: 3 Gy
Session Number: 5

## 2024-04-10 ENCOUNTER — Other Ambulatory Visit: Payer: Self-pay

## 2024-04-10 ENCOUNTER — Ambulatory Visit
Admission: RE | Admit: 2024-04-10 | Discharge: 2024-04-10 | Disposition: A | Discharge status: Home / Self Care | Source: Ambulatory Visit | Attending: Radiation Oncology | Admitting: Radiation Oncology

## 2024-04-10 DIAGNOSIS — Z510 Encounter for antineoplastic radiation therapy: Secondary | ICD-10-CM | POA: Diagnosis not present

## 2024-04-10 LAB — RAD ONC ARIA SESSION SUMMARY
Course Elapsed Days: 7
Plan Fractions Treated to Date: 6
Plan Prescribed Dose Per Fraction: 3 Gy
Plan Total Fractions Prescribed: 10
Plan Total Prescribed Dose: 30 Gy
Reference Point Dosage Given to Date: 18 Gy
Reference Point Session Dosage Given: 3 Gy
Session Number: 6

## 2024-04-11 ENCOUNTER — Other Ambulatory Visit: Payer: Self-pay

## 2024-04-11 ENCOUNTER — Inpatient Hospital Stay

## 2024-04-11 ENCOUNTER — Ambulatory Visit
Admission: RE | Admit: 2024-04-11 | Discharge: 2024-04-11 | Disposition: A | Discharge status: Home / Self Care | Source: Ambulatory Visit | Attending: Radiation Oncology | Admitting: Radiation Oncology

## 2024-04-11 DIAGNOSIS — G893 Neoplasm related pain (acute) (chronic): Secondary | ICD-10-CM | POA: Insufficient documentation

## 2024-04-11 DIAGNOSIS — Z1732 Human epidermal growth factor receptor 2 negative status: Secondary | ICD-10-CM | POA: Insufficient documentation

## 2024-04-11 DIAGNOSIS — Z170 Estrogen receptor positive status [ER+]: Secondary | ICD-10-CM | POA: Insufficient documentation

## 2024-04-11 DIAGNOSIS — C50911 Malignant neoplasm of unspecified site of right female breast: Secondary | ICD-10-CM | POA: Diagnosis not present

## 2024-04-11 DIAGNOSIS — C7951 Secondary malignant neoplasm of bone: Secondary | ICD-10-CM | POA: Diagnosis present

## 2024-04-11 DIAGNOSIS — Z853 Personal history of malignant neoplasm of breast: Secondary | ICD-10-CM | POA: Insufficient documentation

## 2024-04-11 DIAGNOSIS — Z510 Encounter for antineoplastic radiation therapy: Secondary | ICD-10-CM | POA: Insufficient documentation

## 2024-04-11 DIAGNOSIS — Z1721 Progesterone receptor positive status: Secondary | ICD-10-CM | POA: Diagnosis not present

## 2024-04-11 DIAGNOSIS — Z5111 Encounter for antineoplastic chemotherapy: Secondary | ICD-10-CM | POA: Insufficient documentation

## 2024-04-11 DIAGNOSIS — C787 Secondary malignant neoplasm of liver and intrahepatic bile duct: Secondary | ICD-10-CM | POA: Insufficient documentation

## 2024-04-11 DIAGNOSIS — Z1501 Genetic susceptibility to malignant neoplasm of breast: Secondary | ICD-10-CM | POA: Insufficient documentation

## 2024-04-11 DIAGNOSIS — C773 Secondary and unspecified malignant neoplasm of axilla and upper limb lymph nodes: Secondary | ICD-10-CM | POA: Insufficient documentation

## 2024-04-11 DIAGNOSIS — Z1509 Genetic susceptibility to other malignant neoplasm: Secondary | ICD-10-CM | POA: Insufficient documentation

## 2024-04-11 LAB — RAD ONC ARIA SESSION SUMMARY
Course Elapsed Days: 8
Plan Fractions Treated to Date: 7
Plan Prescribed Dose Per Fraction: 3 Gy
Plan Total Fractions Prescribed: 10
Plan Total Prescribed Dose: 30 Gy
Reference Point Dosage Given to Date: 21 Gy
Reference Point Session Dosage Given: 3 Gy
Session Number: 7

## 2024-04-11 LAB — CBC (CANCER CENTER ONLY)
HCT: 31.8 % — ABNORMAL LOW (ref 36.0–46.0)
Hemoglobin: 9.8 g/dL — ABNORMAL LOW (ref 12.0–15.0)
MCH: 25.1 pg — ABNORMAL LOW (ref 26.0–34.0)
MCHC: 30.8 g/dL (ref 30.0–36.0)
MCV: 81.5 fL (ref 80.0–100.0)
Platelet Count: 234 K/uL (ref 150–400)
RBC: 3.9 MIL/uL (ref 3.87–5.11)
RDW: 20.3 % — ABNORMAL HIGH (ref 11.5–15.5)
WBC Count: 5.1 K/uL (ref 4.0–10.5)
nRBC: 0 % (ref 0.0–0.2)

## 2024-04-12 ENCOUNTER — Other Ambulatory Visit: Payer: Self-pay | Admitting: Gastroenterology

## 2024-04-12 ENCOUNTER — Other Ambulatory Visit: Payer: Self-pay

## 2024-04-12 ENCOUNTER — Other Ambulatory Visit (HOSPITAL_COMMUNITY): Payer: Self-pay

## 2024-04-12 ENCOUNTER — Ambulatory Visit
Admission: RE | Admit: 2024-04-12 | Discharge: 2024-04-12 | Disposition: A | Discharge status: Home / Self Care | Source: Ambulatory Visit | Attending: Radiation Oncology | Admitting: Radiation Oncology

## 2024-04-12 DIAGNOSIS — Z510 Encounter for antineoplastic radiation therapy: Secondary | ICD-10-CM | POA: Diagnosis not present

## 2024-04-12 LAB — RAD ONC ARIA SESSION SUMMARY
Course Elapsed Days: 9
Plan Fractions Treated to Date: 8
Plan Prescribed Dose Per Fraction: 3 Gy
Plan Total Fractions Prescribed: 10
Plan Total Prescribed Dose: 30 Gy
Reference Point Dosage Given to Date: 24 Gy
Reference Point Session Dosage Given: 3 Gy
Session Number: 8

## 2024-04-13 ENCOUNTER — Ambulatory Visit
Admission: RE | Admit: 2024-04-13 | Discharge: 2024-04-13 | Disposition: A | Discharge status: Home / Self Care | Source: Ambulatory Visit | Attending: Radiation Oncology | Admitting: Radiation Oncology

## 2024-04-13 ENCOUNTER — Other Ambulatory Visit: Payer: Self-pay

## 2024-04-13 DIAGNOSIS — Z510 Encounter for antineoplastic radiation therapy: Secondary | ICD-10-CM | POA: Diagnosis not present

## 2024-04-13 LAB — RAD ONC ARIA SESSION SUMMARY
Course Elapsed Days: 10
Plan Fractions Treated to Date: 9
Plan Prescribed Dose Per Fraction: 3 Gy
Plan Total Fractions Prescribed: 10
Plan Total Prescribed Dose: 30 Gy
Reference Point Dosage Given to Date: 27 Gy
Reference Point Session Dosage Given: 3 Gy
Session Number: 9

## 2024-04-16 ENCOUNTER — Other Ambulatory Visit: Payer: Self-pay

## 2024-04-16 ENCOUNTER — Ambulatory Visit
Admission: RE | Admit: 2024-04-16 | Discharge: 2024-04-16 | Disposition: A | Discharge status: Home / Self Care | Source: Ambulatory Visit | Attending: Radiation Oncology | Admitting: Radiation Oncology

## 2024-04-16 DIAGNOSIS — Z510 Encounter for antineoplastic radiation therapy: Secondary | ICD-10-CM | POA: Diagnosis not present

## 2024-04-16 LAB — RAD ONC ARIA SESSION SUMMARY
Course Elapsed Days: 13
Plan Fractions Treated to Date: 10
Plan Prescribed Dose Per Fraction: 3 Gy
Plan Total Fractions Prescribed: 10
Plan Total Prescribed Dose: 30 Gy
Reference Point Dosage Given to Date: 30 Gy
Reference Point Session Dosage Given: 3 Gy
Session Number: 10

## 2024-04-18 ENCOUNTER — Other Ambulatory Visit (HOSPITAL_COMMUNITY): Payer: Self-pay

## 2024-04-18 ENCOUNTER — Encounter: Payer: Self-pay | Admitting: Oncology

## 2024-04-18 MED ORDER — OMEPRAZOLE 20 MG PO CPDR: 20.0000 mg | DELAYED_RELEASE_CAPSULE | Freq: Every day | ORAL | 3 refills | Status: AC

## 2024-04-20 ENCOUNTER — Other Ambulatory Visit (HOSPITAL_COMMUNITY): Payer: Self-pay

## 2024-04-20 ENCOUNTER — Encounter: Payer: Self-pay | Admitting: Oncology

## 2024-04-20 NOTE — Radiation Completion Notes (Signed)
 Patient Name: Jacqueline Deleon, KRASS MRN: [id number] Date of Birth: Oct 02, 1960 Referring Physician: JERONA SAYRE, M.D. Date of Service: 2024-04-20 Radiation Oncologist: Marcey Penton, M.D. 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  She has had an excellent response to pain in that area..  She is continue to have progressive pain in her right hip.  Bone scan back in July showed widespread metastatic disease in the axilla and appendicular skeleton with increased uptake within the previous lesion seen in the right upper lateral thoracic cage T10 vertebral body and intra crow proximal right femur.  She was also noted to have a small liver lesion which was biopsied today.  She is currently being treated withFaslodex plus Truqap  which has caused some significant diarrhea although they are planning a possible dose reduction.  She is ambulating fairly well.  Seen today for consideration of palliative radiation therapy to her right hip.      ==========DELIVERED PLANS==========  First Treatment Date: 2024-04-03 Last Treatment Date: 2024-04-16   Plan Name: Pelvis_R Site: Hip, Right Technique: Isodose Plan Mode: Photon Dose Per Fraction: 3 Gy Prescribed Dose (Delivered / Prescribed): 30 Gy / 30 Gy Prescribed Fxs (Delivered / Prescribed): 10 / 10     ==========ON TREATMENT VISIT DATES========== 2024-04-03, 2024-04-10     ==========UPCOMING VISITS========== 05/22/2024 [facility] INJECTION [facility] INJE[facility]  05/14/2024 [facility] RAD ONCOLOGY FOLLOW UP 30 Penton Marcey,  MD  04/25/2024 [facility] OFFICE VISIT Cindie Ole DASEN, MD  04/24/2024 [facility] IN[facility] INJE[facility]  04/24/2024 [facility] EST PT Melanee Annah BROCKS, MD  04/24/2024 [facility] LAB [facility] LAB        ==========APPENDIX - ON TREATMENT VISIT NOTES==========   See weekly On Treatment Notes in Epic for details in the Media tab (listed as Progress notes on the On Treatment Visit Dates listed above).

## 2024-04-24 ENCOUNTER — Other Ambulatory Visit (HOSPITAL_COMMUNITY): Payer: Self-pay

## 2024-04-24 ENCOUNTER — Other Ambulatory Visit: Payer: Self-pay

## 2024-04-24 ENCOUNTER — Encounter: Payer: Self-pay | Admitting: Oncology

## 2024-04-24 ENCOUNTER — Encounter (INDEPENDENT_AMBULATORY_CARE_PROVIDER_SITE_OTHER): Payer: Self-pay

## 2024-04-24 ENCOUNTER — Other Ambulatory Visit: Payer: Self-pay | Admitting: Oncology

## 2024-04-24 ENCOUNTER — Inpatient Hospital Stay (HOSPITAL_BASED_OUTPATIENT_CLINIC_OR_DEPARTMENT_OTHER): Admitting: Oncology

## 2024-04-24 ENCOUNTER — Ambulatory Visit

## 2024-04-24 ENCOUNTER — Inpatient Hospital Stay

## 2024-04-24 VITALS — BP 118/103 | HR 85 | Temp 98.5°F | Resp 18 | Ht 63.0 in | Wt 213.2 lb

## 2024-04-24 DIAGNOSIS — Z79818 Long term (current) use of other agents affecting estrogen receptors and estrogen levels: Secondary | ICD-10-CM | POA: Diagnosis not present

## 2024-04-24 DIAGNOSIS — G893 Neoplasm related pain (acute) (chronic): Secondary | ICD-10-CM

## 2024-04-24 DIAGNOSIS — C7951 Secondary malignant neoplasm of bone: Secondary | ICD-10-CM | POA: Diagnosis not present

## 2024-04-24 DIAGNOSIS — C50919 Malignant neoplasm of unspecified site of unspecified female breast: Secondary | ICD-10-CM | POA: Diagnosis not present

## 2024-04-24 DIAGNOSIS — Z510 Encounter for antineoplastic radiation therapy: Secondary | ICD-10-CM | POA: Diagnosis not present

## 2024-04-24 DIAGNOSIS — Z79899 Other long term (current) drug therapy: Secondary | ICD-10-CM

## 2024-04-24 LAB — CMP (CANCER CENTER ONLY)
ALT: 15 U/L (ref 0–44)
AST: 36 U/L (ref 15–41)
Albumin: 3.3 g/dL — ABNORMAL LOW (ref 3.5–5.0)
Alkaline Phosphatase: 113 U/L (ref 38–126)
Anion gap: 11 (ref 5–15)
BUN: 16 mg/dL (ref 8–23)
CO2: 23 mmol/L (ref 22–32)
Calcium: 8.3 mg/dL — ABNORMAL LOW (ref 8.9–10.3)
Chloride: 104 mmol/L (ref 98–111)
Creatinine: 1.46 mg/dL — ABNORMAL HIGH (ref 0.44–1.00)
GFR, Estimated: 41 mL/min — ABNORMAL LOW (ref >60)
Glucose, Bld: 126 mg/dL — ABNORMAL HIGH (ref 70–99)
Potassium: 4.9 mmol/L (ref 3.5–5.1)
Sodium: 138 mmol/L (ref 135–145)
Total Bilirubin: 0.5 mg/dL (ref 0.0–1.2)
Total Protein: 6.6 g/dL (ref 6.5–8.1)

## 2024-04-24 LAB — CBC WITH DIFFERENTIAL/PLATELET
Abs Immature Granulocytes: 0.18 K/uL — ABNORMAL HIGH (ref 0.00–0.07)
Basophils Absolute: 0 K/uL (ref 0.0–0.1)
Basophils Relative: 0 %
Eosinophils Absolute: 0.3 K/uL (ref 0.0–0.5)
Eosinophils Relative: 3 %
HCT: 33.4 % — ABNORMAL LOW (ref 36.0–46.0)
Hemoglobin: 10.2 g/dL — ABNORMAL LOW (ref 12.0–15.0)
Immature Granulocytes: 2 %
Lymphocytes Relative: 15 %
Lymphs Abs: 1.2 K/uL (ref 0.7–4.0)
MCH: 25.1 pg — ABNORMAL LOW (ref 26.0–34.0)
MCHC: 30.5 g/dL (ref 30.0–36.0)
MCV: 82.3 fL (ref 80.0–100.0)
Monocytes Absolute: 1 K/uL (ref 0.1–1.0)
Monocytes Relative: 12 %
Neutro Abs: 5.3 K/uL (ref 1.7–7.7)
Neutrophils Relative %: 68 %
Platelets: 232 K/uL (ref 150–400)
RBC: 4.06 MIL/uL (ref 3.87–5.11)
RDW: 19.8 % — ABNORMAL HIGH (ref 11.5–15.5)
WBC: 8 K/uL (ref 4.0–10.5)
nRBC: 0.4 % — ABNORMAL HIGH (ref 0.0–0.2)

## 2024-04-24 MED ORDER — CAPIVASERTIB 160 MG PO TBPK
320.0000 mg | ORAL_TABLET | Freq: Two times a day (BID) | ORAL | 0 refills | Status: DC
  Filled 2024-04-24: qty 64, 16d supply, fill #0
  Filled 2024-04-24: qty 64, 28d supply, fill #0

## 2024-04-24 MED ORDER — FULVESTRANT 250 MG/5ML IM SOSY
500.0000 mg | PREFILLED_SYRINGE | Freq: Once | INTRAMUSCULAR | Status: AC
  Administered 2024-04-24: 500 mg via INTRAMUSCULAR
  Filled 2024-04-24: qty 10

## 2024-04-24 NOTE — Progress Notes (Signed)
 Specialty Pharmacy Refill Coordination Note  [name] is a 63 y.o. female contacted today regarding refills of specialty medication(s) Capivasertib  (TRUQAP )   Patient requested Delivery   Delivery date: 04/26/24   Verified address: [address] [place], [place] [address]   Medication will be filled on 04/25/24.

## 2024-04-24 NOTE — Progress Notes (Unsigned)
  Electrophysiology Office Follow up Visit Note:    Date:  04/25/2024   ID:  Jacqueline Deleon, DOB Nov 19, 1960, MRN [id number]  PCP:  Diedra Lame, MD  [facility] Cardiologist:  None  [facility] Electrophysiologist:  OLE ONEIDA HOLTS, MD    Interval History:     Jacqueline Deleon is a 63 y.o. female who presents for a follow up visit.   I last saw the patient January 12, 2023.  She has a history of atrial flutter.  At the time of her last appointment she had not had a recurrence.  She took aspirin 81 mg by mouth once daily in the past for stroke prophylaxis.  She also has a history of Nash cirrhosis.  Today she is doing fair.  She has some not so great news recently about her breast cancer.  She has some discomfort for which she takes narcotics intermittently.  Her only complaint today from a cardiovascular perspective is intermittent low blood pressures.  She will feel lightheaded during the spells.  She did stop her ACE inhibitor which has helped some.  When she has a higher salt load her blood pressures will improve.  She thinks she may be dehydrated intermittently.      Past medical, surgical, social and family history were reviewed.  ROS:   Please see the history of present illness.    All other systems reviewed and are negative.  EKGs/Labs/Other Studies Reviewed:    The following studies were reviewed today:          Physical Exam:    VS:  BP 108/60 (BP Location: Left Arm, Patient Position: Sitting)   Pulse 87   Ht 5' 3 (1.6 m)   Wt 214 lb 4 oz (97.2 kg)   BMI 37.95 kg/m     Wt Readings from Last 3 Encounters:  04/25/24 214 lb 4 oz (97.2 kg)  04/24/24 213 lb 3.2 oz (96.7 kg)  03/16/24 214 lb 9.6 oz (97.3 kg)     GEN: no distress CARD: RRR, No MRG RESP: No IWOB. CTAB.      ASSESSMENT:    1. Atrial flutter with rapid ventricular response (HCC)   2. Cirrhosis of liver not due to alcohol (HCC)   3. Hypotension, unspecified hypotension type    PLAN:     In order of problems listed above:  #Atrial flutter Single episode. Continue aspirin 81 mg by mouth once daily  #Intermittent hypotension I encouraged staying adequately hydrated.  She can try using sports drinks such as Gatorade or liquid IV to assist in maintaining her volume status.  I discussed my upcoming departure from [facility].  She will transition her care to Dr. Kennyth in the future.   Follow-up as needed with the EP   Signed, OLE HOLTS, MD, [facility], [facility] 04/25/2024 8:20 AM    Electrophysiology [facility]

## 2024-04-25 ENCOUNTER — Ambulatory Visit: Attending: Cardiology | Admitting: Cardiology

## 2024-04-25 ENCOUNTER — Other Ambulatory Visit: Payer: Self-pay

## 2024-04-25 ENCOUNTER — Encounter: Payer: Self-pay | Admitting: Cardiology

## 2024-04-25 VITALS — BP 108/60 | HR 87 | Ht 63.0 in | Wt 214.2 lb

## 2024-04-25 DIAGNOSIS — I959 Hypotension, unspecified: Secondary | ICD-10-CM

## 2024-04-25 DIAGNOSIS — I4892 Unspecified atrial flutter: Secondary | ICD-10-CM | POA: Diagnosis not present

## 2024-04-25 DIAGNOSIS — K7460 Unspecified cirrhosis of liver: Secondary | ICD-10-CM

## 2024-04-25 LAB — CANCER ANTIGEN 27.29: CA 27.29: 366.4 U/mL — ABNORMAL HIGH (ref 0.0–38.6)

## 2024-04-25 NOTE — Patient Instructions (Signed)

## 2024-04-26 ENCOUNTER — Other Ambulatory Visit: Payer: Self-pay

## 2024-04-27 ENCOUNTER — Encounter: Payer: Self-pay | Admitting: Oncology

## 2024-04-28 ENCOUNTER — Encounter: Payer: Self-pay | Admitting: Oncology

## 2024-04-28 NOTE — Progress Notes (Signed)
 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  It was a 1.4 cm tumor with negative lymph nodes grade 1 and patient went on to get lumpectomy followed by adjuvant radiation therapy.  Oncotype DX score was 6 and she did not require adjuvant chemotherapy.  She was on tamoxifen for 5 years.  She had BRCA testing done at that time which was negative.   Patient felt a lump in her right axilla in September 2021.  She underwent ultrasound of bilateral breasts which did not pick up any axillary mass.  A prior screening mammogram in September 2021 was unremarkable.  She was then admitted for Covid pneumonia and underwent CT angio chest on 07/22/2020 which incidentally picked up an axillary mass measuring 2.5 cm.  No obvious breast mass.  Axillary mass was biopsied and was consistent with high-grade invasive mammary carcinoma.  IHC was positive for GATA3 with diffuse strong nuclear staining.  There was no lymph node architecture identified in the sample and it could not be determined if it is a primary tumor within the axillary breast tissue or metastases.  ER strongly positive greater than 90%, PR 1 to 10% positive and HER-2 negative- 0   PET CT scan showed hypermetabolic poorly marginated solid right axillary mass 2.5 x 2 cm with an SUV of 5.6.  No enlarged  hypermetabolic mediastinal or hilar lymph nodes no enlarged left axillary lymph nodes.  Subcentimeter lung nodules below PET resolution.  Small hypermetabolism in the right liver with an SUV of 6 without CT correlate equivocal for liver metastases.  Multiple hypermetabolic faintly lytic osseous lesions throughout the lumbar spine and bilateral pelvic girdle with representative supra-acetabular right iliac bone 1.9 cm lesion, anterior superior left acetabular 1.7 cm lesion and L1 1.2 cm lesion.   NGS testing showed no actionable mutations.  PD-L1 less than 1%.  CHEK2 S422 FS, PIK3CA N1068 FS, PIK3CA N345H, CCN E1 gain, ERB B2 1 P-CSF 1R fusion, FGF 19 gain, FGF 3 gain, FGF 4gain.   Ibrance  plus letrozole  started in February 2022.    Patient noted to have increase in CA 27-29 from 26 and November 2023 203 in July 2024.  CT chest abdomen pelvis with contrast and bone scan showed worsening metastatic disease in the bones especially in the thoracolumbar spine and right hemipelvis.    Peripheral blood NGS testing showed CHEK2 mutation, PIK3CA P.N1068 FS, PIK3CA P.N345H, PTEN P.R173S, ESR 1P.D538G, ESR 1P.L536R, PIK3CA P.R88Q.  MSI high not detected  Elacestrant  started in sept 2024.  Disease progression noted in June 2025 and given the presence of PIK3CA mutation patient switched capivasertib  plus fulvestrant     Interval history- Discussed the use of AI scribe software for clinical note transcription with the patient, who gave verbal consent to proceed.  History of Present Illness   Jacqueline Deleon is a 63 year old female with breast cancer who  presents for follow-up on her current treatment regimen.  She has a history of breast cancer, initially diagnosed with a strongly estrogen-positive tumor based on a lymph node biopsy. She was started on Ibrance  and letrozole , but upon progression, NGS testing revealed PIK3CA and ESR1 mutations. She was then treated with elacestrant  for the ESR1 mutation, but  experienced rapid progression, leading to a switch to Faslodex  plus capivasertib  in June 2025.   She experiences side effects from her current treatment, including diarrhea, particularly towards the end of her medication cycle. She uses Imodium  and Lomotil  to manage these symptoms, though she sometimes forgets to take them. She also experiences abdominal sensitivity and uses Dilaudid  and long-acting morphine  for pain management, noting the importance of maintaining her pain medication schedule.  She had a recent issue with a biopsy mix-up at the hospital, which has since been addressed. She is currently receiving Faslodex  injections.      ECOG PS- 1 Pain scale- 3 Opioid associated constipation- no  Review of systems- Review of Systems  Constitutional:  Positive for malaise/fatigue. Negative for chills, fever and weight loss.  HENT:  Negative for congestion, ear discharge and nosebleeds.   Eyes:  Negative for blurred vision.  Respiratory:  Negative for cough, hemoptysis, sputum production, shortness of breath and wheezing.   Cardiovascular:  Negative for chest pain, palpitations, orthopnea and claudication.  Gastrointestinal:  Positive for diarrhea. Negative for abdominal pain, blood in stool, constipation, heartburn, melena, nausea and vomiting.  Genitourinary:  Negative for dysuria, flank pain, frequency, hematuria and urgency.  Musculoskeletal:  Positive for back pain and joint pain. Negative for myalgias.  Skin:  Negative for rash.  Neurological:  Negative for dizziness, tingling, focal weakness, seizures, weakness and headaches.  Endo/Heme/Allergies:  Does not bruise/bleed easily.  Psychiatric/Behavioral:  Negative for depression and suicidal ideas. The patient does not have insomnia.       Allergies  Allergen Reactions   Kiwi Extract Itching and Swelling    The real kiwi fruit   Codeine Other (See Comments)    Felt funny.   Food Itching and Swelling    Patient states, it's  grapes.   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   Chicken pox    Depression    Fatty liver    Fatty liver    Gastric polyps    GERD (gastroesophageal reflux disease)    Gestational diabetes    prediabetes out side of having baby   Headache    migraines   Heart murmur    History of abnormal mammogram 08/2006, 01/2008, 03/2009   Hypertension    IBS (irritable bowel syndrome)    Joint disorder    hx of left ankle tendonitis, bursitis, heel spur   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 Smith/ laprascopic surgery   COLONOSCOPY  04/04/2000   COLONOSCOPY WITH PROPOFOL  N/A 02/12/2019   Procedure: COLONOSCOPY WITH PROPOFOL ;  Surgeon: Gaylyn Gladis PENNER, MD;  Location: [facility] ENDOSCOPY;  Service: Endoscopy;  Laterality: N/A;   ESOPHAGOGASTRODUODENOSCOPY (EGD) WITH PROPOFOL   N/A 05/01/2015   Procedure: ESOPHAGOGASTRODUODENOSCOPY (EGD) WITH PROPOFOL ;  Surgeon: Deward CINDERELLA Piedmont, MD;  Location: [facility] ENDOSCOPY;  Service: Gastroenterology;  Laterality: N/A;   ESOPHAGOGASTRODUODENOSCOPY (EGD) WITH PROPOFOL  N/A 02/12/2019   Procedure: ESOPHAGOGASTRODUODENOSCOPY (EGD) WITH PROPOFOL ;  Surgeon: Gaylyn Gladis PENNER, MD;  Location: [facility] ENDOSCOPY;  Service: Endoscopy;  Laterality: N/A;   EYE SURGERY  08/2012   laser surgery on retina/ DR Appenzeller   FINGER SURGERY     repair of tendon in right index, Dr. Nancyann in [facility]   FOOT SURGERY     Dr. Verta   IRRIGATION AND DEBRIDEMENT SEBACEOUS CYST     right upper back   LAPAROSCOPIC SUPRACERVICAL HYSTERECTOMY  06/2007   Dr Kincius/ adhesions/CPP/adenomyosis   MASTECTOMY PARTIAL / LUMPECTOMY Right 01/2008   with sentinal lymph node biopsy/ infiltrating ductal cancer   REPAIR PERONEAL TENDONS ANKLE  10/2019   with tarsal exostectomy and sural neuroma excision on right    SKIN CANCER EXCISION     back and calves   WISDOM TOOTH EXTRACTION  1991    Social History   Socioeconomic History   Marital status: Married    Spouse name: Marcey   Number of children: 1   Years of education: 14   Highest education level: Not on file  Occupational History   Occupation: CMA  Tobacco Use   Smoking status: Never   Smokeless tobacco: Never  Vaping Use   Vaping status: Never Used  Substance and Sexual Activity   Alcohol use: Yes    Alcohol/week: 0.0 standard drinks of alcohol    Comment: rare, 1-2 drinks per year   Drug use: No   Sexual activity: Yes    Partners: Male    Birth control/protection: Surgical    Comment: Hysterectomy   Other Topics Concern   Not on file  Social History Narrative   Lives in [place] with husband, no pets.  Works at [employer].      Diet - regular   Exercise - occasional   Social Drivers of Health   Financial Resource Strain: Low Risk  (01/16/2024)   Received from [facility]    Overall Financial Resource Strain (CARDIA)    Difficulty of Paying Living Expenses: Not hard at all  Food Insecurity: No Food Insecurity (01/16/2024)   Received from [facility]   Hunger Vital Sign    Within the past 12 months, you worried that your food would run out before you got the money to buy more.: Never true    Within the past 12 months, the food  you bought just didn't last and you didn't have money to get more.: Never true  Transportation Needs: No Transportation Needs (01/16/2024)   Received from [facility] - Transportation    In the past 12 months, has lack of transportation kept you from medical appointments or from getting medications?: No    Lack of Transportation (Non-Medical): No  Physical Activity: Insufficiently Active (07/27/2017)   Exercise Vital Sign    Days of Exercise per Week: 4 days    Minutes of Exercise per Session: 30 min  Stress: No Stress Concern Present (07/27/2017)   [employer] of Occupational Health - Occupational Stress Questionnaire    Feeling of Stress : Only a little  Social Connections: Socially Integrated (07/27/2017)   Social Connection and Isolation Panel    Frequency of Communication with Friends and Family: More than three times a week    Frequency of Social Gatherings with Friends and Family: Once a week    Attends Religious Services: More than 4 times per year    Active Member of Clubs or Organizations: Yes    Attends Banker Meetings: More than 4 times per year    Marital Status: Married  Catering manager Violence: Not At Risk (07/27/2017)   Humiliation, Afraid, Rape, and Kick questionnaire    Fear of Current or Ex-Partner: No    Emotionally Abused: No    Physically Abused: No    Sexually Abused: No    Family History  Problem Relation Age of Onset   Hypertension Mother    Thyroid  disease Mother    Breast cancer Mother 90   Colon cancer Mother 70       again at 48    Atrial fibrillation Mother    Hip fracture Mother    Skin cancer Mother    Stroke Father    Hypertension Father    Cancer Father 61       prostate   Parkinson's disease Father    Skin cancer Father    Atrial fibrillation Sister        Pacemaker inserted end of Sept-beginning of Oct. 2024   Hypertension Sister    Thyroid  disease Sister    Cancer Sister        skin/ squamous cell   Heart Problems Sister    Diabetes Maternal Aunt    Cancer Maternal Aunt    Breast cancer Maternal Aunt 75   Lymphoma Maternal Uncle    Heart disease Maternal Uncle    Melanoma Maternal Uncle 31   Lung cancer Paternal Aunt 43       smoker   Diabetes Maternal Grandmother    Stroke Maternal Grandfather    Heart disease Paternal Grandfather    Thyroid  cancer Cousin        maternal   Breast cancer Cousin 60       maternal   Breast cancer Cousin 51   Colon cancer Cousin      Current Outpatient Medications:    aspirin EC 81 MG tablet, Take by mouth., Disp: , Rfl:    b complex vitamins capsule, Take 1 capsule by mouth daily., Disp: , Rfl:    Blood Glucose Monitoring Suppl (BLOOD GLUCOSE MONITOR SYSTEM) w/Device KIT, Use to test blood sugars in the morning, at noon, and at bedtime., Disp: 1 kit, Rfl: 0   buPROPion  (WELLBUTRIN  XL) 150 MG 24 hr tablet, Take 1 tablet (150 mg total) by mouth daily. Take along a 300mg  tablet  for a total daily dose of 450mg ., Disp: 90 tablet, Rfl: 3   buPROPion  (WELLBUTRIN  XL) 300 MG 24 hr tablet, Take 1 tablet (300 mg total) by mouth daily., Disp: 90 tablet, Rfl: 3   busPIRone  (BUSPAR ) 10 MG tablet, Take 1 tablet (10 mg total) by mouth 2 (two) times daily, Disp: 180 tablet, Rfl: 1   calcium carbonate (OSCAL) 1500 (600 Ca) MG TABS tablet, Take by mouth 2 (two) times daily with a meal., Disp: , Rfl:    capivasertib  (TRUQAP ) 160 MG pack, Take 2 tablets (320 mg total) by mouth 2 (two) times daily. Take for 4 days, then hold for 3 days. Repeat every 7 days., Disp: 64 tablet, Rfl:  0   Cholecalciferol (VITAMIN D3) 10 MCG (400 UNIT) CAPS, Take by mouth., Disp: , Rfl:    citalopram  (CELEXA ) 40 MG tablet, Take 1 tablet (40 mg total) by mouth daily., Disp: 90 tablet, Rfl: 3   diphenhydrAMINE  (BENADRYL ) 25 MG tablet, Take 25 mg by mouth at bedtime as needed for sleep. Pt states daily now for sleep and allergies., Disp: , Rfl:    gabapentin  (NEURONTIN ) 100 MG capsule, Take 1 capsule (100 mg total) by mouth every morning AND 2 capsules (200 mg total) every evening., Disp: 270 capsule, Rfl: 1   HYDROmorphone  (DILAUDID ) 2 MG tablet, Take 2 tablets (4 mg total) by mouth every 6 (six) hours as needed for severe pain (pain score 7-10)., Disp: 120 tablet, Rfl: 0   ketotifen  (ZADITOR ) 0.025 % ophthalmic solution, Place 1 drop into both eyes daily., Disp: , Rfl:    Magnesium Glycinate 100 MG CAPS, , Disp: , Rfl:    metFORMIN  (GLUCOPHAGE -XR) 500 MG 24 hr tablet, Take 1 tablet (500 mg total) by mouth 2 (two) times daily with a meal., Disp: 180 tablet, Rfl: 1   morphine  (MS CONTIN ) 30 MG 12 hr tablet, Take 1 tablet (30 mg total) by mouth every 8 (eight) hours., Disp: 90 tablet, Rfl: 0   Multiple Vitamin (MULTIVITAMIN) tablet, Take 1 tablet by mouth daily., Disp: , Rfl:    nitrofurantoin , macrocrystal-monohydrate, (MACROBID ) 100 MG capsule, Take 1 capsule (100 mg total) by mouth 2 (two) times daily., Disp: 14 capsule, Rfl: 0   omeprazole  (PRILOSEC) 20 MG capsule, Take 1 capsule (20 mg total) by mouth daily., Disp: 90 capsule, Rfl: 3   ondansetron  (ZOFRAN ) 4 MG tablet, Take 1 tablet (4 mg total) by mouth every 8 (eight) hours as needed for nausea or vomiting., Disp: 90 tablet, Rfl: 0   ondansetron  (ZOFRAN ) 8 MG tablet, Take 1 tablet (8 mg total) by mouth every 8 (eight) hours as needed for nausea or vomiting., Disp: 30 tablet, Rfl: 1   prochlorperazine  (COMPAZINE ) 10 MG tablet, Take 1 tablet (10 mg total) by mouth every 6 (six) hours as needed for nausea or vomiting., Disp: 30 tablet, Rfl: 1    diphenoxylate -atropine  (LOMOTIL ) 2.5-0.025 MG tablet, Take 2 tablets by mouth 4 (four) times daily as needed for diarrhea or loose stools., Disp: 60 tablet, Rfl: 0   loperamide  (IMODIUM ) 2 MG capsule, Take 2 tabs by mouth with first loose stool, then 1 tab with each additional loose stool as needed. Do not exceed 8 tabs in a 24-hour period, Disp: 30 capsule, Rfl: 0  Physical exam:  Vitals:   04/24/24 1312  BP: (!) 118/103  Pulse: 85  Resp: 18  Temp: 98.5 F (36.9 C)  TempSrc: Tympanic  SpO2: 100%  Weight: 213 lb 3.2 oz (96.7 kg)  Height: 5' 3 (1.6 m)   Physical Exam Cardiovascular:     Rate and Rhythm: Normal rate and regular rhythm.     Heart sounds: Normal heart sounds.  Pulmonary:     Effort: Pulmonary effort is normal.     Breath sounds: Normal breath sounds.  Skin:    General: Skin is warm and dry.  Neurological:     Mental Status: She is alert and oriented to person, place, and time.      I have personally reviewed labs listed below:    Latest Ref Rng & Units 04/24/2024   12:31 PM  CMP  Glucose 70 - 99 mg/dL 873   BUN 8 - 23 mg/dL 16   Creatinine 9.55 - 1.00 mg/dL 8.53   Sodium 864 - 854 mmol/L 138   Potassium 3.5 - 5.1 mmol/L 4.9   Chloride 98 - 111 mmol/L 104   CO2 22 - 32 mmol/L 23   Calcium 8.9 - 10.3 mg/dL 8.3   Total Protein 6.5 - 8.1 g/dL 6.6   Total Bilirubin 0.0 - 1.2 mg/dL 0.5   Alkaline Phos 38 - 126 U/L 113   AST 15 - 41 U/L 36   ALT 0 - 44 U/L 15       Latest Ref Rng & Units 04/24/2024   12:30 PM  CBC  WBC 4.0 - 10.5 K/uL 8.0   Hemoglobin 12.0 - 15.0 g/dL 89.7   Hematocrit 63.9 - 46.0 % 33.4   Platelets 150 - 400 K/uL 232       Assessment and plan- Patient is a 63 y.o. female of metastatic ER/PR positive HER2 negative breast cancer with liver and bone metastases presently on Faslodex  plus Capivasertib  here to discuss further management          Assessment and Plan    Metastatic breast cancer with liver and bone  involvement Progression on initial treatment led to NGS testing revealing PIK3CA and ESR1 mutations. Current treatment with Faslodex  and capivasertib .  Her last MRI in August 2025 showed new liver lesions. Her most recent CA27-29 tumor marker was 414, up from 375 a month ago. A recent liver biopsy showed a decrease in estrogen receptor positivity to 30%, progesterone receptor positivity at 5%, and HER2 negativity, with a low KI-67 index of 1%.Recent liver biopsy shows low hormone receptor positivity, indicating a less hormone-driven tumor. KI-67 at 1% suggests slow growth which is somewhat contradictory to a tumor that is not strongly hormone driven. Discussed potential transition to chemotherapy if current regimen fails. - Order MRI of the liver in 3-4 weeks to assess treatment response. - Order CT of the chest and bone scan. - Monitor tumor markers; expedite scans if CA2729 increases significantly. - Continue current treatment regimen until scan results are available. - Consider chemotherapy if current treatment is ineffective.  Neoplasm-related pain Abdominal pain due to metastatic breast cancer controlled with Dilaudid  and long-acting morphine . - Continue current pain management with Dilaudid  and long-acting morphine .  Diarrhea due to cancer therapy Diarrhea managed with Lomotil  and Imodium , though adherence is inconsistent. - Refill Lomotil  prescription. - Continue using over-the-counter Imodium  as needed.                                                             Visit  Diagnosis 1. Pain from bone metastases (HCC)   2. Primary malignant neoplasm of breast with metastasis (HCC)   3. Use of fulvestrant  (Faslodex )   4. High risk medication use      Dr. Annah Skene, MD, MPH [facility] at [facility] [phone] 04/28/2024 1:29 PM

## 2024-04-30 ENCOUNTER — Encounter: Payer: Self-pay | Admitting: Oncology

## 2024-05-01 ENCOUNTER — Other Ambulatory Visit: Payer: Self-pay

## 2024-05-08 ENCOUNTER — Ambulatory Visit

## 2024-05-09 ENCOUNTER — Other Ambulatory Visit

## 2024-05-11 ENCOUNTER — Encounter: Payer: Self-pay | Admitting: Oncology

## 2024-05-13 ENCOUNTER — Encounter: Payer: Self-pay | Admitting: Oncology

## 2024-05-14 ENCOUNTER — Other Ambulatory Visit: Payer: Self-pay

## 2024-05-14 ENCOUNTER — Encounter
Admission: RE | Admit: 2024-05-14 | Discharge: 2024-05-14 | Disposition: A | Source: Ambulatory Visit | Attending: Oncology

## 2024-05-14 ENCOUNTER — Encounter: Payer: Self-pay | Admitting: Oncology

## 2024-05-14 ENCOUNTER — Ambulatory Visit: Admitting: Radiation Oncology

## 2024-05-14 ENCOUNTER — Encounter
Admission: RE | Admit: 2024-05-14 | Discharge: 2024-05-14 | Disposition: A | Discharge status: Home / Self Care | Source: Ambulatory Visit | Attending: Oncology | Admitting: Oncology

## 2024-05-14 DIAGNOSIS — C50919 Malignant neoplasm of unspecified site of unspecified female breast: Secondary | ICD-10-CM

## 2024-05-14 MED ORDER — MORPHINE SULFATE ER 30 MG PO TBCR: 30.0000 mg | EXTENDED_RELEASE_TABLET | Freq: Three times a day (TID) | ORAL | 0 refills | Status: DC

## 2024-05-14 MED ORDER — DIPHENOXYLATE-ATROPINE 2.5-0.025 MG PO TABS: 2.0000 | ORAL_TABLET | Freq: Four times a day (QID) | ORAL | 0 refills | Status: AC | PRN

## 2024-05-14 MED ORDER — TECHNETIUM TC 99M MEDRONATE IV KIT
20.0000 | PACK | Freq: Once | INTRAVENOUS | Status: AC | PRN
  Administered 2024-05-14: 21.12 via INTRAVENOUS

## 2024-05-14 NOTE — Telephone Encounter (Signed)
 Rx pended and sent to Dr. Melanee

## 2024-05-15 ENCOUNTER — Encounter: Payer: Self-pay | Admitting: Oncology

## 2024-05-15 ENCOUNTER — Ambulatory Visit
Admission: RE | Admit: 2024-05-15 | Discharge: 2024-05-15 | Disposition: A | Discharge status: Home / Self Care | Source: Ambulatory Visit | Attending: Oncology | Admitting: Oncology

## 2024-05-15 ENCOUNTER — Other Ambulatory Visit: Payer: Self-pay

## 2024-05-15 ENCOUNTER — Ambulatory Visit
Admission: RE | Admit: 2024-05-15 | Discharge: 2024-05-15 | Disposition: A | Source: Ambulatory Visit | Attending: Oncology

## 2024-05-15 ENCOUNTER — Encounter (INDEPENDENT_AMBULATORY_CARE_PROVIDER_SITE_OTHER): Payer: Self-pay

## 2024-05-15 ENCOUNTER — Other Ambulatory Visit: Payer: Self-pay | Admitting: Oncology

## 2024-05-15 DIAGNOSIS — C50919 Malignant neoplasm of unspecified site of unspecified female breast: Secondary | ICD-10-CM

## 2024-05-15 MED ORDER — CAPIVASERTIB 160 MG PO TBPK
320.0000 mg | ORAL_TABLET | Freq: Two times a day (BID) | ORAL | 0 refills | Status: DC
  Filled 2024-05-16: qty 64, 16d supply, fill #0
  Filled 2024-05-16: qty 64, 28d supply, fill #0

## 2024-05-15 MED ORDER — GADOBUTROL 1 MMOL/ML IV SOLN
10.0000 mL | Freq: Once | INTRAVENOUS | Status: AC | PRN
  Administered 2024-05-15: 10 mL via INTRAVENOUS

## 2024-05-16 ENCOUNTER — Other Ambulatory Visit: Payer: Self-pay

## 2024-05-16 ENCOUNTER — Other Ambulatory Visit: Payer: Self-pay | Admitting: Pharmacy Technician

## 2024-05-16 ENCOUNTER — Other Ambulatory Visit (HOSPITAL_COMMUNITY): Payer: Self-pay

## 2024-05-16 NOTE — Progress Notes (Signed)
 Specialty Pharmacy Refill Coordination Note  Jacqueline Deleon is a 63 y.o. female contacted today regarding refills of specialty medication(s)  Capivasertib  (TRUQAP )      Patient requested (Patient-Rptd) Delivery   Delivery date: 05/22/2024 Verified address: (Patient-Rptd) [address] [place], [place]   Medication will be filled on: 05/21/2024

## 2024-05-17 ENCOUNTER — Ambulatory Visit: Admitting: Radiation Oncology

## 2024-05-21 ENCOUNTER — Other Ambulatory Visit: Payer: Self-pay

## 2024-05-22 ENCOUNTER — Ambulatory Visit

## 2024-05-23 ENCOUNTER — Encounter: Payer: Self-pay | Admitting: Oncology

## 2024-05-23 ENCOUNTER — Other Ambulatory Visit: Payer: Self-pay

## 2024-05-23 ENCOUNTER — Inpatient Hospital Stay: Admitting: Oncology

## 2024-05-23 ENCOUNTER — Inpatient Hospital Stay: Attending: Oncology

## 2024-05-23 ENCOUNTER — Inpatient Hospital Stay

## 2024-05-23 VITALS — BP 82/50 | HR 83 | Temp 98.3°F | Resp 17 | Ht 63.0 in | Wt 208.5 lb

## 2024-05-23 DIAGNOSIS — C50919 Malignant neoplasm of unspecified site of unspecified female breast: Secondary | ICD-10-CM

## 2024-05-23 DIAGNOSIS — C50611 Malignant neoplasm of axillary tail of right female breast: Secondary | ICD-10-CM | POA: Diagnosis not present

## 2024-05-23 DIAGNOSIS — Z170 Estrogen receptor positive status [ER+]: Secondary | ICD-10-CM | POA: Diagnosis not present

## 2024-05-23 DIAGNOSIS — Z7189 Other specified counseling: Secondary | ICD-10-CM

## 2024-05-23 DIAGNOSIS — Z1509 Genetic susceptibility to other malignant neoplasm: Secondary | ICD-10-CM | POA: Diagnosis not present

## 2024-05-23 DIAGNOSIS — C7951 Secondary malignant neoplasm of bone: Secondary | ICD-10-CM

## 2024-05-23 DIAGNOSIS — Z1501 Genetic susceptibility to malignant neoplasm of breast: Secondary | ICD-10-CM | POA: Diagnosis not present

## 2024-05-23 DIAGNOSIS — C787 Secondary malignant neoplasm of liver and intrahepatic bile duct: Secondary | ICD-10-CM

## 2024-05-23 DIAGNOSIS — Z853 Personal history of malignant neoplasm of breast: Secondary | ICD-10-CM | POA: Diagnosis not present

## 2024-05-23 DIAGNOSIS — Z5111 Encounter for antineoplastic chemotherapy: Secondary | ICD-10-CM | POA: Insufficient documentation

## 2024-05-23 LAB — CMP (CANCER CENTER ONLY)
ALT: 17 U/L (ref 0–44)
AST: 26 U/L (ref 15–41)
Albumin: 3.4 g/dL — ABNORMAL LOW (ref 3.5–5.0)
Alkaline Phosphatase: 102 U/L (ref 38–126)
Anion gap: 10 (ref 5–15)
BUN: 17 mg/dL (ref 8–23)
CO2: 25 mmol/L (ref 22–32)
Calcium: 8.9 mg/dL (ref 8.9–10.3)
Chloride: 102 mmol/L (ref 98–111)
Creatinine: 1.53 mg/dL — ABNORMAL HIGH (ref 0.44–1.00)
GFR, Estimated: 38 mL/min — ABNORMAL LOW (ref >60)
Glucose, Bld: 144 mg/dL — ABNORMAL HIGH (ref 70–99)
Potassium: 4.2 mmol/L (ref 3.5–5.1)
Sodium: 137 mmol/L (ref 135–145)
Total Bilirubin: 0.4 mg/dL (ref 0.0–1.2)
Total Protein: 7 g/dL (ref 6.5–8.1)

## 2024-05-23 LAB — CBC WITH DIFFERENTIAL (CANCER CENTER ONLY)
Abs Immature Granulocytes: 0.04 K/uL (ref 0.00–0.07)
Basophils Absolute: 0 K/uL (ref 0.0–0.1)
Basophils Relative: 0 %
Eosinophils Absolute: 0.1 K/uL (ref 0.0–0.5)
Eosinophils Relative: 3 %
HCT: 33.7 % — ABNORMAL LOW (ref 36.0–46.0)
Hemoglobin: 10.2 g/dL — ABNORMAL LOW (ref 12.0–15.0)
Immature Granulocytes: 1 %
Lymphocytes Relative: 19 %
Lymphs Abs: 1 K/uL (ref 0.7–4.0)
MCH: 25.1 pg — ABNORMAL LOW (ref 26.0–34.0)
MCHC: 30.3 g/dL (ref 30.0–36.0)
MCV: 82.8 fL (ref 80.0–100.0)
Monocytes Absolute: 0.6 K/uL (ref 0.1–1.0)
Monocytes Relative: 11 %
Neutro Abs: 3.4 K/uL (ref 1.7–7.7)
Neutrophils Relative %: 66 %
Platelet Count: 255 K/uL (ref 150–400)
RBC: 4.07 MIL/uL (ref 3.87–5.11)
RDW: 18.7 % — ABNORMAL HIGH (ref 11.5–15.5)
WBC Count: 5.2 K/uL (ref 4.0–10.5)
nRBC: 0 % (ref 0.0–0.2)

## 2024-05-23 MED ORDER — FULVESTRANT 250 MG/5ML IM SOSY
500.0000 mg | PREFILLED_SYRINGE | Freq: Once | INTRAMUSCULAR | Status: AC
  Administered 2024-05-23: 500 mg via INTRAMUSCULAR
  Filled 2024-05-23: qty 10

## 2024-05-23 NOTE — Progress Notes (Signed)
 Mainly nausea, threw up once since last visit.  Diarrhea has been better. Numbness and tingling to hands and feet every once in a while. Requested refill on gabapentin , informed prescribed by Babaoff, Jerona Ruts, MD; patient said she'll follow up with him. Requested a decal; will complete

## 2024-05-24 ENCOUNTER — Encounter: Payer: Self-pay | Admitting: Radiation Oncology

## 2024-05-24 ENCOUNTER — Ambulatory Visit
Admission: RE | Admit: 2024-05-24 | Discharge: 2024-05-24 | Disposition: A | Discharge status: Home / Self Care | Source: Ambulatory Visit | Attending: Radiation Oncology | Admitting: Radiation Oncology

## 2024-05-24 ENCOUNTER — Telehealth: Payer: Self-pay

## 2024-05-24 VITALS — BP 146/84 | HR 75 | Temp 98.1°F | Resp 16 | Wt 209.0 lb

## 2024-05-24 DIAGNOSIS — C50611 Malignant neoplasm of axillary tail of right female breast: Secondary | ICD-10-CM | POA: Insufficient documentation

## 2024-05-24 DIAGNOSIS — Z923 Personal history of irradiation: Secondary | ICD-10-CM | POA: Insufficient documentation

## 2024-05-24 DIAGNOSIS — C7951 Secondary malignant neoplasm of bone: Secondary | ICD-10-CM | POA: Diagnosis present

## 2024-05-24 DIAGNOSIS — Z79811 Long term (current) use of aromatase inhibitors: Secondary | ICD-10-CM | POA: Diagnosis not present

## 2024-05-24 LAB — CANCER ANTIGEN 27.29: CA 27.29: 367.5 U/mL — ABNORMAL HIGH (ref 0.0–38.6)

## 2024-05-24 LAB — CANCER ANTIGEN 15-3: CA 15-3: 343 U/mL — ABNORMAL HIGH (ref 0.0–25.0)

## 2024-05-24 NOTE — Progress Notes (Signed)
 Radiation Oncology Follow up Note  Name: Jacqueline Deleon   Date:   05/24/2024 MRN:  [id number] DOB: 04-16-1961    This 63 y.o. female presents to the clinic today for 1 month follow-up status post palliative radiation therapy to her right hip.  For palliation and patient with known stage IV breast cancer previously treated to her left hip  REFERRING PROVIDER: Diedra Lame, MD  HPI: Patient is a 63 year old female now out 1 month having completed radiation therapy to her right hip for metastatic stage IV breast cancer.  She is seen today in routine follow-up she has very low side effect profile.  She states the pain is markedly improved and under control.  She is still using some narcotic analgesics..  She is currently onFaslodex plus capivasertib  in June 2025.SABRA  She continues to take narcotic analgesics for areas of pain although there is no specific site that extremely painful at this time.  COMPLICATIONS OF TREATMENT: none  FOLLOW UP COMPLIANCE: keeps appointments   PHYSICAL EXAM:  BP (!) 146/84   Pulse 75   Temp 98.1 F (36.7 C)   Resp 16   Wt 209 lb (94.8 kg)   BMI 37.02 kg/m  Range of motion of her lower extremities does not elicit pain.  Motor and sensory levels are equal and symmetric in the lower extremities.  Proprioception is intact.  Deep palpation of her spine does not elicit pain.  Well-developed well-nourished patient in NAD. HEENT reveals PERLA, EOMI, discs not visualized.  Oral cavity is clear. No oral mucosal lesions are identified. Neck is clear without evidence of cervical or supraclavicular adenopathy. Lungs are clear to A&P. Cardiac examination is essentially unremarkable with regular rate and rhythm without murmur rub or thrill. Abdomen is benign with no organomegaly or masses noted. Motor sensory and DTR levels are equal and symmetric in the upper and lower extremities. Cranial nerves II through XII are grossly intact. Proprioception is intact. No peripheral  adenopathy or edema is identified. No motor or sensory levels are noted. Crude visual fields are within normal range.  RADIOLOGY RESULTS: No current films for review  PLAN: At the present time patient is doing well under good pain control at this time.  I am going to turn follow-up care over to medical oncology where she is receiving treatment I would be happy to reevaluate her anytime should further palliative radiation therapy be indicated.  Patient knows to call with any concerns at any time.  I would like to take this opportunity to thank you for allowing me to participate in the care of your patient.SABRA Marcey Penton, MD

## 2024-05-24 NOTE — Telephone Encounter (Signed)
 Patient in need of a port.  Per Jacqueline Deleon I can put her on Thurs 11/20 at 11a an arrive at 10a. Let me know. Thanks.  Outbound call; spoke to patient informed of above.  Informed patient NPO after midnight, done [facility], needs driver due to moderate sedation being involved; patient agreed to above date / time.  Reviewed upcoming MUGA scheduled on 11/18.  Patient asked when she would start treatment, informed treatment plan has not been established as of yet. Patient verbalized understanding.

## 2024-05-26 ENCOUNTER — Encounter: Payer: Self-pay | Admitting: Oncology

## 2024-05-26 MED ORDER — LIDOCAINE-PRILOCAINE 2.5-2.5 % EX CREA
TOPICAL_CREAM | CUTANEOUS | 3 refills | Status: AC
  Filled 2024-05-26 – 2024-05-28 (×3): qty 30, 30d supply, fill #0
  Filled 2024-06-22: qty 30, 30d supply, fill #1
  Filled 2024-07-22: qty 30, 30d supply, fill #2

## 2024-05-26 MED ORDER — ONDANSETRON HCL 8 MG PO TABS
8.0000 mg | ORAL_TABLET | Freq: Three times a day (TID) | ORAL | 1 refills | Status: AC | PRN
  Filled 2024-05-26 – 2024-05-28 (×3): qty 30, 10d supply, fill #0

## 2024-05-26 NOTE — Addendum Note (Signed)
 Addended by: MELANEE ANNAH BROCKS on: 05/26/2024 06:26 PM   Modules accepted: Orders

## 2024-05-26 NOTE — Progress Notes (Signed)
 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  It was a 1.4 cm tumor with negative lymph nodes grade 1 and patient went on to get lumpectomy followed by adjuvant radiation therapy.  Oncotype DX score was 6 and she did not require adjuvant chemotherapy.  She was on tamoxifen for 5 years.  She had BRCA testing done at that time which was negative.   Patient felt a lump in her right axilla in September 2021.  She underwent ultrasound of bilateral breasts which did not pick up any axillary mass.  A prior screening mammogram in September 2021 was unremarkable.  She was then admitted for Covid pneumonia and underwent CT angio chest on 07/22/2020 which incidentally picked up an axillary mass measuring 2.5 cm.  No obvious breast mass.  Axillary mass was biopsied and was consistent with high-grade invasive mammary carcinoma.  IHC was positive for GATA3 with diffuse strong nuclear staining.  There was no lymph node architecture identified in the sample and it could not be determined if it is a primary tumor within the axillary breast tissue or metastases.  ER strongly positive greater than 90%, PR 1 to 10% positive and HER-2 negative- 0   PET CT scan showed hypermetabolic poorly marginated solid right axillary mass 2.5 x 2 cm with an SUV of 5.6.  No enlarged hypermetabolic mediastinal or hilar lymph nodes no enlarged left axillary lymph  nodes.  Subcentimeter lung nodules below PET resolution.  Small hypermetabolism in the right liver with an SUV of 6 without CT correlate equivocal for liver metastases.  Multiple hypermetabolic faintly lytic osseous lesions throughout the lumbar spine and bilateral pelvic girdle with representative supra-acetabular right iliac bone 1.9 cm lesion, anterior superior left acetabular 1.7 cm lesion and L1 1.2 cm lesion.   NGS testing showed no actionable mutations.  PD-L1 less than 1%.  CHEK2 S422 FS, PIK3CA N1068 FS, PIK3CA N345H, CCN E1 gain, ERB B2 1 P-CSF 1R fusion, FGF 19 gain, FGF 3 gain, FGF 4gain.   Ibrance  plus letrozole  started in February 2022.    Patient noted to have increase in CA 27-29 from 26 and November 2023 203 in July 2024.  CT chest abdomen pelvis with contrast and bone scan showed worsening metastatic disease in the bones especially in the thoracolumbar spine and right hemipelvis.    Peripheral blood NGS testing showed CHEK2 mutation, PIK3CA P.N1068 FS, PIK3CA P.N345H, PTEN P.R173S, ESR 1P.D538G, ESR 1P.L536R, PIK3CA P.R88Q.  MSI high not detected  Elacestrant  started in sept 2024.  Disease progression noted in June 2025 and given the presence of PIK3CA mutation patient switched capivasertib  plus fulvestrant       Interval history- Discussed the use of AI scribe software for clinical note transcription with the patient, who gave verbal consent to proceed.  History of Present Illness   The patient, with metastatic breast cancer, presents with progression of liver metastases.  She experiences weight loss and decreased appetite, with her caregiver noting  that she sometimes does not finish meals. Her current medications include Trucap at a dose of 160 mg, taken as two tablets in the morning and two in the evening. Her kidney function has shown a slight increase in creatinine levels from 1.3 a month ago to 1.5 currently.  She is due for her Faslodex  injection and is on a schedule for  Zoledronate every three months. She has recently received a new box of medication in the mail.     ECOG PS- 2 Pain scale- 3 Opioid associated constipation- no  Review of systems- Review of Systems  Constitutional:  Positive for malaise/fatigue. Negative for chills, fever and weight loss.  HENT:  Negative for congestion, ear discharge and nosebleeds.   Eyes:  Negative for blurred vision.  Respiratory:  Negative for cough, hemoptysis, sputum production, shortness of breath and wheezing.   Cardiovascular:  Negative for chest pain, palpitations, orthopnea and claudication.  Gastrointestinal:  Negative for abdominal pain, blood in stool, constipation, diarrhea, heartburn, melena, nausea and vomiting.  Genitourinary:  Negative for dysuria, flank pain, frequency, hematuria and urgency.  Musculoskeletal:  Positive for back pain and joint pain. Negative for myalgias.  Skin:  Negative for rash.  Neurological:  Negative for dizziness, tingling, focal weakness, seizures, weakness and headaches.  Endo/Heme/Allergies:  Does not bruise/bleed easily.  Psychiatric/Behavioral:  Negative for depression and suicidal ideas. The patient does not have insomnia.       Allergies  Allergen Reactions   Kiwi Extract Itching and Swelling    The real kiwi fruit   Codeine Other (See Comments)    Felt funny.   Food Itching and Swelling    Patient states, it's grapes.   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   Chicken pox     Depression    Fatty liver    Fatty liver    Gastric polyps    GERD (gastroesophageal reflux disease)    Gestational diabetes    prediabetes out side of having baby   Headache    migraines   Heart murmur    History of abnormal mammogram 08/2006, 01/2008, 03/2009   Hypertension    IBS (irritable bowel syndrome)    Joint disorder    hx of left ankle tendonitis, bursitis, heel spur   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 Smith/ laprascopic surgery   COLONOSCOPY  04/04/2000   COLONOSCOPY WITH PROPOFOL  N/A 02/12/2019   Procedure: COLONOSCOPY WITH PROPOFOL ;  Surgeon: Gaylyn Gladis PENNER, MD;  Location: [facility] ENDOSCOPY;  Service: Endoscopy;  Laterality: N/A;   ESOPHAGOGASTRODUODENOSCOPY (EGD) WITH PROPOFOL  N/A 05/01/2015   Procedure: ESOPHAGOGASTRODUODENOSCOPY (EGD) WITH PROPOFOL ;  Surgeon: Deward CINDERELLA Piedmont, MD;  Location: [facility] ENDOSCOPY;  Service: Gastroenterology;  Laterality: N/A;   ESOPHAGOGASTRODUODENOSCOPY (EGD) WITH PROPOFOL  N/A 02/12/2019   Procedure: ESOPHAGOGASTRODUODENOSCOPY (EGD) WITH PROPOFOL ;  Surgeon: Gaylyn Gladis PENNER, MD;  Location: [facility] ENDOSCOPY;  Service: Endoscopy;  Laterality: N/A;   EYE SURGERY  08/2012   laser surgery on retina/ DR Appenzeller   FINGER SURGERY     repair of tendon in right index, Dr. Nancyann in [facility]   FOOT SURGERY      Dr. Verta   IRRIGATION AND DEBRIDEMENT SEBACEOUS CYST     right upper back   LAPAROSCOPIC SUPRACERVICAL HYSTERECTOMY  06/2007   Dr Kincius/ adhesions/CPP/adenomyosis   MASTECTOMY PARTIAL / LUMPECTOMY Right 01/2008   with sentinal lymph node biopsy/ infiltrating ductal cancer   REPAIR PERONEAL TENDONS ANKLE  10/2019   with tarsal exostectomy and sural neuroma excision on right    SKIN CANCER EXCISION     back and calves   WISDOM TOOTH EXTRACTION  1991    Social History   Socioeconomic History   Marital status: Married    Spouse name: Marcey   Number of children: 1   Years of education: 14   Highest education level: Not on file  Occupational History   Occupation: CMA  Tobacco Use   Smoking status: Never   Smokeless tobacco: Never  Vaping Use   Vaping status: Never Used  Substance and Sexual Activity   Alcohol use: Yes    Alcohol/week: 0.0 standard drinks of alcohol    Comment: rare, 1-2 drinks per year   Drug use: No   Sexual activity: Yes    Partners: Male    Birth control/protection: Surgical    Comment: Hysterectomy   Other Topics Concern   Not on file  Social History Narrative   Lives in [place] with husband, no pets.  Works at [employer].      Diet - regular   Exercise - occasional   Social Drivers of Health   Financial Resource Strain: Low Risk  (01/16/2024)   Received from [facility]   Overall Financial Resource Strain (CARDIA)    Difficulty of Paying Living Expenses: Not hard at all  Food Insecurity: No Food Insecurity (01/16/2024)   Received from [facility]   Hunger Vital Sign    Within the past 12 months, you worried that your food would run out before you got the money to buy more.: Never true    Within the past 12 months, the food you bought just didn't last and you didn't have money to get more.: Never true  Transportation Needs: No Transportation Needs (01/16/2024)   Received from [facility] - Transportation    In the past 12 months, has lack of transportation kept you from medical appointments or from getting medications?: No    Lack of Transportation (Non-Medical): No  Physical Activity: Insufficiently Active (07/27/2017)   Exercise Vital Sign  Days of Exercise per Week: 4 days    Minutes of Exercise per Session: 30 min  Stress: No Stress Concern Present (07/27/2017)   [employer] of Occupational [employer] - Occupational Stress Questionnaire    Feeling of Stress : Only a little  Social Connections: Socially Integrated (07/27/2017)   Social Connection and Isolation Panel    Frequency of Communication with Friends and Family: More than three times a week    Frequency of Social Gatherings with Friends and Family: Once a week    Attends Religious Services: More than 4 times per year    Active Member of Clubs or Organizations: Yes    Attends Banker Meetings: More than 4 times per year    Marital Status: Married  Catering Manager Violence: Not At Risk (07/27/2017)   Humiliation, Afraid, Rape, and Kick questionnaire    Fear of Current or Ex-Partner: No    Emotionally Abused: No    Physically Abused: No    Sexually Abused: No    Family History  Problem Relation Age of Onset   Hypertension Mother    Thyroid  disease Mother    Breast cancer Mother 10   Colon cancer Mother 42       again at 15   Atrial fibrillation Mother    Hip fracture Mother    Skin cancer Mother    Stroke Father    Hypertension Father    Cancer Father 48       prostate   Parkinson's disease Father    Skin cancer Father    Atrial fibrillation Sister        Pacemaker inserted end of Sept-beginning of Oct. 2024   Hypertension Sister    Thyroid  disease Sister    Cancer Sister        skin/ squamous cell   Heart Problems Sister    Diabetes Maternal Aunt    Cancer Maternal Aunt    Breast cancer Maternal Aunt 75   Lymphoma Maternal Uncle    Heart disease Maternal Uncle     Melanoma Maternal Uncle 67   Lung cancer Paternal Aunt 71       smoker   Diabetes Maternal Grandmother    Stroke Maternal Grandfather    Heart disease Paternal Grandfather    Thyroid  cancer Cousin        maternal   Breast cancer Cousin 60       maternal   Breast cancer Cousin 24   Colon cancer Cousin      Current Outpatient Medications:    aspirin EC 81 MG tablet, Take by mouth., Disp: , Rfl:    b complex vitamins capsule, Take 1 capsule by mouth daily., Disp: , Rfl:    Blood Glucose Monitoring Suppl (BLOOD GLUCOSE MONITOR SYSTEM) w/Device KIT, Use to test blood sugars in the morning, at noon, and at bedtime., Disp: 1 kit, Rfl: 0   buPROPion  (WELLBUTRIN  XL) 150 MG 24 hr tablet, Take 1 tablet (150 mg total) by mouth daily. Take along a 300mg  tablet for a total daily dose of 450mg ., Disp: 90 tablet, Rfl: 3   buPROPion  (WELLBUTRIN  XL) 300 MG 24 hr tablet, Take 1 tablet (300 mg total) by mouth daily., Disp: 90 tablet, Rfl: 3   busPIRone  (BUSPAR ) 10 MG tablet, Take 1 tablet (10 mg total) by mouth 2 (two) times daily, Disp: 180 tablet, Rfl: 1   calcium carbonate (OSCAL) 1500 (600 Ca) MG TABS tablet, Take by mouth 2 (two) times daily with  a meal., Disp: , Rfl:    capivasertib  (TRUQAP ) 160 MG pack, Take 2 tablets (320 mg total) by mouth 2 (two) times daily. Take for 4 days, then hold for 3 days. Repeat every 7 days., Disp: 64 tablet, Rfl: 0   Cholecalciferol (VITAMIN D3) 10 MCG (400 UNIT) CAPS, Take by mouth., Disp: , Rfl:    citalopram  (CELEXA ) 40 MG tablet, Take 1 tablet (40 mg total) by mouth daily., Disp: 90 tablet, Rfl: 3   diphenhydrAMINE  (BENADRYL ) 25 MG tablet, Take 25 mg by mouth at bedtime as needed for sleep. Pt states daily now for sleep and allergies., Disp: , Rfl:    diphenoxylate -atropine  (LOMOTIL ) 2.5-0.025 MG tablet, Take 2 tablets by mouth 4 (four) times daily as needed for diarrhea or loose stools., Disp: 60 tablet, Rfl: 0   gabapentin  (NEURONTIN ) 100 MG capsule, Take 1 capsule  (100 mg total) by mouth every morning AND 2 capsules (200 mg total) every evening., Disp: 270 capsule, Rfl: 1   HYDROmorphone  (DILAUDID ) 2 MG tablet, Take 2 tablets (4 mg total) by mouth every 6 (six) hours as needed for severe pain (pain score 7-10)., Disp: 120 tablet, Rfl: 0   ketotifen  (ZADITOR ) 0.025 % ophthalmic solution, Place 1 drop into both eyes daily. (Patient taking differently: Place 1 drop into both eyes as needed.), Disp: , Rfl:    loperamide  (IMODIUM ) 2 MG capsule, Take 2 tabs by mouth with first loose stool, then 1 tab with each additional loose stool as needed. Do not exceed 8 tabs in a 24-hour period (Patient taking differently: as needed. Take 2 tabs by mouth with first loose stool, then 1 tab with each additional loose stool as needed. Do not exceed 8 tabs in a 24-hour period), Disp: 30 capsule, Rfl: 0   Magnesium Glycinate 100 MG CAPS, , Disp: , Rfl:    metFORMIN  (GLUCOPHAGE -XR) 500 MG 24 hr tablet, Take 1 tablet (500 mg total) by mouth 2 (two) times daily with a meal., Disp: 180 tablet, Rfl: 1   morphine  (MS CONTIN ) 30 MG 12 hr tablet, Take 1 tablet (30 mg total) by mouth every 8 (eight) hours., Disp: 90 tablet, Rfl: 0   Multiple Vitamin (MULTIVITAMIN) tablet, Take 1 tablet by mouth daily., Disp: , Rfl:    nitrofurantoin , macrocrystal-monohydrate, (MACROBID ) 100 MG capsule, Take 1 capsule (100 mg total) by mouth 2 (two) times daily. (Patient taking differently: Take 100 mg by mouth as needed.), Disp: 14 capsule, Rfl: 0   omeprazole  (PRILOSEC) 20 MG capsule, Take 1 capsule (20 mg total) by mouth daily., Disp: 90 capsule, Rfl: 3   ondansetron  (ZOFRAN ) 4 MG tablet, Take 1 tablet (4 mg total) by mouth every 8 (eight) hours as needed for nausea or vomiting., Disp: 90 tablet, Rfl: 0   ondansetron  (ZOFRAN ) 8 MG tablet, Take 1 tablet (8 mg total) by mouth every 8 (eight) hours as needed for nausea or vomiting., Disp: 30 tablet, Rfl: 1   prochlorperazine  (COMPAZINE ) 10 MG tablet, Take 1  tablet (10 mg total) by mouth every 6 (six) hours as needed for nausea or vomiting., Disp: 30 tablet, Rfl: 1  Physical exam:  Vitals:   05/23/24 1502  BP: (!) 82/50  Pulse: 83  Resp: 17  Temp: 98.3 F (36.8 C)  TempSrc: Tympanic  SpO2: 100%  Weight: 208 lb 8 oz (94.6 kg)  Height: 5' 3 (1.6 m)   Physical Exam Cardiovascular:     Rate and Rhythm: Normal rate and regular rhythm.  Heart sounds: Normal heart sounds.  Pulmonary:     Effort: Pulmonary effort is normal.     Breath sounds: Normal breath sounds.  Musculoskeletal:     Cervical back: Normal range of motion.  Skin:    General: Skin is warm and dry.  Neurological:     Mental Status: She is alert and oriented to person, place, and time.      I have personally reviewed labs listed below:    Latest Ref Rng & Units 05/23/2024    2:32 PM  CMP  Glucose 70 - 99 mg/dL 855   BUN 8 - 23 mg/dL 17   Creatinine 9.55 - 1.00 mg/dL 8.46   Sodium 864 - 854 mmol/L 137   Potassium 3.5 - 5.1 mmol/L 4.2   Chloride 98 - 111 mmol/L 102   CO2 22 - 32 mmol/L 25   Calcium 8.9 - 10.3 mg/dL 8.9   Total Protein 6.5 - 8.1 g/dL 7.0   Total Bilirubin 0.0 - 1.2 mg/dL 0.4   Alkaline Phos 38 - 126 U/L 102   AST 15 - 41 U/L 26   ALT 0 - 44 U/L 17       Latest Ref Rng & Units 05/23/2024    2:32 PM  CBC  WBC 4.0 - 10.5 K/uL 5.2   Hemoglobin 12.0 - 15.0 g/dL 89.7   Hematocrit 63.9 - 46.0 % 33.7   Platelets 150 - 400 K/uL 255    I have personally reviewed Radiology images listed below: No images are attached to the encounter.  NM Bone Scan Whole Body Result Date: 05/16/2024 EXAM: NM WHOLE BODY BONE SCAN 05/14/2024 02:33:00 PM TECHNIQUE: Anterior and posterior whole body images were obtained at least 3 hours following the intravenous administration of radiopharmaceutical. RADIOPHARMACEUTICAL: 21.12 mCi Tc-64m MDP COMPARISON: 02/08/2024 CLINICAL HISTORY: breast cancer mets to bone FINDINGS: BONES: Similar appearance of widespread  metastatic disease to bone including the ribs, spine, sacrum, right acetabulum, bilateral humeral shafts, intertrochanteric region of the right femur, left femoral shaft, and possibly the calvarium. Degenerative findings in the shoulders, knees, and ankle regions. SOFT TISSUES: Physiologic activity within the kidneys and urinary bladder. IMPRESSION: 1. Similar appearance on distribution of widespread metastatic disease including involvement of the ribs, spine, sacrum, right acetabulum, bilateral humeral shafts, intertrochanteric region of the right femur, left femoral shaft, and possibly the calvarium. 2. Degenerative changes in the shoulders, knees, and ankles. Electronically signed by: Ryan Salvage MD 05/16/2024 09:30 AM EST RP Workstation: [id number]   MR LIVER W WO CONTRAST Result Date: 05/16/2024 EXAM: MRCP WITH AND WITHOUT IV CONTRAST 05/15/2024 01:48:05 PM TECHNIQUE: Multisequence, multiplanar magnetic resonance images of the abdomen with and without intravenous contrast. MRCP sequences were performed. 10 mL gadobutrol  (GADAVIST ) 1 MMOL/ML injection was administered. COMPARISON: 03/02/2024 CLINICAL HISTORY: liver metastases; breast cancer FINDINGS: LIVER: Numerous scattered small metastatic lesions throughout all lobes of the liver appear mildly increased in size and number compared to 03/02/2024. For example, on image 70 of series 11, foci of abnormal diffusion weighted imaging posteriorly in the right hepatic lobe are visibly increased compared to 03/02/2024. Some individual lesions are relatively similar including the 1.1 cm lesion posteriorly in the right hepatic lobe on image 66 series 11. Individual lesions measure up to about 1.3 cm in diameter with most of the lesions subcentimeter in size. These lesions have faintly accentuated T2 signal and the larger lesions demonstrate moderate arterial phase enhancement. The widespread distribution of the lesions and conspicuity of the smaller  lesions is  highest on the diffusion weighted images. GALLBLADDER AND BILIARY SYSTEM: Gallbladder is absent. No intrahepatic or extrahepatic ductal dilation. SPLEEN: Unremarkable. PANCREAS/PANCREATIC DUCT: Visualized pancreas is unremarkable. No pancreatic ductal dilatation. ADRENAL GLANDS: Unremarkable. 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 LYMPH NODES: No enlarged abdominal lymph nodes. VASCULATURE: Unremarkable. PERITONEUM: No ascites. ABDOMINAL WALL: No hernia. No mass. BOWEL: Prominence of stool throughout the colon, favoring constipation. No bowel obstruction. BONES: Widespread diffuse osseous metastatic disease. 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 SOFT TISSUES: Unremarkable. MISCELLANEOUS: Unremarkable. IMPRESSION: 1. Numerous hepatic metastases mildly increased in size and number compared to 03/02/24. 2. Widespread diffuse osseous metastatic disease. 3. 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 4. No biliary dilatation. Gallbladder absent. 5. Prominence of stool throughout the colon, favoring constipation. Electronically signed by: Ryan Salvage MD 05/16/2024 09:18 AM EST RP Workstation: [id number]   CT Chest Wo Contrast Result Date: 05/16/2024 EXAM: CT CHEST WITHOUT CONTRAST 05/15/2024 01:40:56 PM TECHNIQUE: CT of the chest was performed without the administration of intravenous contrast. Multiplanar reformatted images are provided for review. Automated exposure control, iterative reconstruction, and/or weight based adjustment of the mA/kV was utilized to reduce the radiation dose to as low as reasonably achievable. COMPARISON: Multiple exams including PET CT 03/19/2024. CLINICAL HISTORY: Breast cancer metastatic to bone restaging assessment. * Tracking Code: BO * FINDINGS: MEDIASTINUM: Heart and pericardium are unremarkable. The central airways are clear. LYMPH  NODES: Node 0.9 cm in short axis on image 67 series 2, within normal limits. Several tiny perifissural nodules are likely lymph nodes. No axillary lymphadenopathy. LUNGS AND PLEURA: 6 x 4 mm anterior right middle lobe nodule on image 78 series 4. Previous patchy consolidation in the left lower lobe has resolved. No pulmonary edema. No pleural effusion or pneumothorax. SOFT TISSUES/BONES: Roughly stable appearance of widespread scattered lytic and sclerotic metastatic lesions in the thoracic spine, ribs, bilateral scapulae, bilateral clavicle, and sternum compatible with diffuse/widespread osseous metastatic disease. 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 Previously seen liver lesions are not well characterized on today's noncontrast exam. IMPRESSION: 1. Stable widespread osseous metastatic disease involving the thoracic spine, ribs, bilateral scapulae, bilateral clavicle, and sternum. 2. 6 x 4 mm anterior right middle lobe pulmonary nodule; surveillance in the context of the patient anticipated follow-up oncology imaging suggestive. 3. Resolved patchy consolidation in the left lower lobe. Electronically signed by: Ryan Salvage MD 05/16/2024 09:02 AM EST RP Workstation: [id number]     Assessment and plan- Patient is a 63 y.o. female with metastatic breast cancer with bone and liver metastases here to discuss mri results and further management  Assessment and Plan    Metastatic ER-positive, HER2-low breast cancer with liver involvement  Her tumor markers, specifically CA 27-29, were 414 two months ago and decreased to 366 four weeks ago.  A liver biopsy indicated that the breast cancer is evolving, showing only 30% estrogen positivity and progesterone negativity, compared to a previous axillary lymph node biopsy that was 80-90% estrogen positive. HER2 testing on the liver  biopsy showed HER2 zero by our pathology and HER2 low (plus two) by [facility] Her 2 IHC  I have reviewed mri images independently and discussed findings with the patient. There is mild progression in liver metastases.  Tumor markers stable.   However due to progression noted in August and October 2025, I would like to switch therapy at this eimt  I am recommending  Considering trastuzumab deruxtecan due to HER2 low status. Discussed potential side effects including interstitial lung disease and cardiac dysfunction. - Arrange port placement next week. - Order echocardiogram or MUGA scan to assess cardiac function.  - In the phase Destiny III trial - Of 557 patients who underwent randomization, 494 (88.7%) had hormone receptor-positive disease and 63 (11.3%) had hormone receptor-negative disease. In the hormone receptor-positive cohort, the median progression-free survival was 10.1 months in the trastuzumab deruxtecan group and 5.4 months in the physician's choice group (hazard ratio for disease progression or death, 0.51; P<0.001), and overall survival was 23.9 months and 17.5 months, respectively (hazard ratio for death, 0.64; P=0.003). Among all patients, the median progression-free survival was 9.9 months in the trastuzumab deruxtecan group and 5.1 months in the physician's choice group (hazard ratio for disease progression or death, 0.50; P<0.001), and overall survival was 23.4 months and 16.8 months, respectively (hazard ratio for death, 0.64; P=0.001). - Continue current hormone therapy until Enhertu starts. - Monitor tumor markers and perform MRI of liver and whole body scan in two months.  Chronic kidney disease, unspecified stage Creatinine levels increased from 1.3 to 1.5, indicating worsening kidney function. - Advised improved hydration.  Unintentional weight loss Experiencing weight loss with decreased appetite. - Continue nutritional support with milkshakes and small meals.  Monitoring  for interstitial lung disease and cardiac dysfunction due to trastuzumab deruxtecan Potential side effects of trastuzumab deruxtecan include interstitial lung disease and cardiac dysfunction. - Monitor for signs of interstitial lung disease and cardiac dysfunction during treatment. - Perform regular blood work every three weeks to monitor blood counts.         Visit Diagnosis 1. Primary malignant neoplasm of breast with metastasis (HCC)   2. Goals of care, counseling/discussion      Dr. Annah Skene, MD, MPH [facility] [phone] 05/26/2024 5:49 PM

## 2024-05-27 ENCOUNTER — Other Ambulatory Visit: Payer: Self-pay

## 2024-05-28 ENCOUNTER — Other Ambulatory Visit (HOSPITAL_COMMUNITY): Payer: Self-pay

## 2024-05-28 ENCOUNTER — Other Ambulatory Visit: Payer: Self-pay | Admitting: Oncology

## 2024-05-28 ENCOUNTER — Other Ambulatory Visit: Payer: Self-pay

## 2024-05-28 ENCOUNTER — Encounter: Payer: Self-pay | Admitting: Oncology

## 2024-05-28 DIAGNOSIS — C50919 Malignant neoplasm of unspecified site of unspecified female breast: Secondary | ICD-10-CM

## 2024-05-28 MED ORDER — GABAPENTIN 100 MG PO CAPS
ORAL_CAPSULE | ORAL | 0 refills | Status: AC
  Filled 2024-05-28: qty 270, 90d supply, fill #0

## 2024-05-28 NOTE — Progress Notes (Signed)
 Pharmacist Chemotherapy Monitoring - Initial Assessment    Anticipated start date: 06/11/24   The following has been reviewed per standard work regarding the patient's treatment regimen: The patient's diagnosis, treatment plan and drug doses, and organ/hematologic function Lab orders and baseline tests specific to treatment regimen  The treatment plan start date, drug sequencing, and pre-medications Prior authorization status  Patient's documented medication list, including drug-drug interaction screen and prescriptions for anti-emetics and supportive care specific to the treatment regimen The drug concentrations, fluid compatibility, administration routes, and timing of the medications to be used The patient's access for treatment and lifetime cumulative dose history, if applicable  The patient's medication allergies and previous infusion related reactions, if applicable   Changes made to treatment plan:  N/A  Follow up needed:  Pending authorization for treatment  and baseline echo planned 11/18   Maudie FORBES Andreas, PharmD, BCPS Clinical Pharmacist   05/28/2024  3:09 PM

## 2024-05-29 ENCOUNTER — Other Ambulatory Visit: Payer: Self-pay

## 2024-05-29 ENCOUNTER — Encounter: Payer: Self-pay | Admitting: Oncology

## 2024-05-29 ENCOUNTER — Other Ambulatory Visit: Payer: Self-pay | Admitting: Radiology

## 2024-05-29 ENCOUNTER — Ambulatory Visit

## 2024-05-29 MED ORDER — HYDROMORPHONE HCL 2 MG PO TABS: 4.0000 mg | ORAL_TABLET | Freq: Four times a day (QID) | ORAL | 0 refills | Status: DC | PRN

## 2024-05-29 NOTE — Telephone Encounter (Signed)
 Spoke with patient, the 11/21 appts were r/s to 12/1.  Megan, patient will have port placed for this visit, please change lab to port lab.

## 2024-05-29 NOTE — Telephone Encounter (Signed)
 Rx pended and sent to MD

## 2024-05-30 ENCOUNTER — Other Ambulatory Visit: Payer: Self-pay

## 2024-05-30 ENCOUNTER — Other Ambulatory Visit (HOSPITAL_COMMUNITY): Payer: Self-pay | Admitting: Radiology

## 2024-05-30 ENCOUNTER — Encounter: Payer: Self-pay | Admitting: Oncology

## 2024-05-30 MED ORDER — HYDROMORPHONE HCL 2 MG PO TABS
4.0000 mg | ORAL_TABLET | Freq: Four times a day (QID) | ORAL | 0 refills | Status: DC | PRN
  Filled 2024-05-30: qty 120, 15d supply, fill #0

## 2024-05-30 NOTE — Telephone Encounter (Signed)
 Rx sent to [facility] yesterday but they do not have in stock.    New rx pended for MD to sign and send to [facility] call placed to [facility] to [facility] rx from yesterday.

## 2024-05-30 NOTE — Progress Notes (Signed)
 PCP: Diedra Lame, MD   Chief Complaint  Patient presents with   Gynecologic Exam    No concerns    HPI:      [name] is a 63 y.o. G1P0101 whose LMP was No LMP recorded. Patient has had a hysterectomy., presents today for her annual examination.  Her menses are absent due to supracx hyst. No PMB/pelvic pain.  She does have tolerable vasomotor sx, can't do hormones.   Sex activity: single partner, contraception - post menopausal status. She does have vaginal dryness improved with lubricants but still uncomfortable. Couldn't have vag ERT in past but black-box warning just removed.   Last Pap: 04/07/23 Results were: no abnormalities /neg HPV DNA 2019. Still has cx Hx of STDs: none  Last mammogram: 05/26/23 Results were normal, repeat in 12 months. 08/20/20 Results were: cat 6 for RT axillary mass that was positive for recurrent metastatic breast cancer on bx. Pt followed by oncology at [facility] (last appt 11/25) and has seen [facility].  Mets documented to pelvic girdle, liver, ribs, bilateral humeral shafts and femur, spine and shoulder blades now. Did short course radiation tx to leg due to pain. Sx improved.  Losing wt due to decreased appetite with meds.   There is a FH of breast cancer in her mom and mat aunt. Mom also with colon cancer.  There is no FH of ovarian cancer. The patient does do self-breast exams. Pt is BRCA neg 2004/ MyRisk panel done 2/22 was CHEK 2 positive.  Colonoscopy: 2020 at [facility] without abnormalities;  Repeat due after 5 years per NCCN guidelines prior to 2025 guidelines. Mother with hx of colon cancer but pt found to be CHEK2 positive (no longer increased risk of colon cancer per 2025 NCCN guidelines)   Tobacco use: The patient denies current or previous tobacco use. Alcohol use: none No drug use Exercise: min active  She does get adequate calcium and Vitamin D in her diet.  Labs with PCP. DEXA with oncology 9/23 at [facility] was normal spine/hip. Doing  zometa  still.    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   Chicken pox    Depression    Fatty liver    Fatty liver    Gastric polyps    GERD (gastroesophageal reflux disease)    Gestational diabetes    prediabetes out side of having baby   Headache    migraines   Heart murmur    History of abnormal mammogram 08/2006, 01/2008, 03/2009   Hypertension    IBS (irritable bowel syndrome)    Joint disorder    hx of left ankle tendonitis, bursitis, heel spur   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 Smith/ laprascopic surgery   COLONOSCOPY  04/04/2000   COLONOSCOPY WITH PROPOFOL  N/A 02/12/2019   Procedure: COLONOSCOPY WITH PROPOFOL ;  Surgeon: Gaylyn Gladis PENNER, MD;  Location: [facility] ENDOSCOPY;  Service: Endoscopy;  Laterality: N/A;   ESOPHAGOGASTRODUODENOSCOPY (EGD) WITH PROPOFOL  N/A 05/01/2015   Procedure: ESOPHAGOGASTRODUODENOSCOPY (EGD) WITH PROPOFOL ;  Surgeon: Deward CINDERELLA Piedmont, MD;  Location: [facility]  ENDOSCOPY;  Service: Gastroenterology;  Laterality: N/A;   ESOPHAGOGASTRODUODENOSCOPY (EGD) WITH PROPOFOL  N/A 02/12/2019   Procedure: ESOPHAGOGASTRODUODENOSCOPY (EGD) WITH PROPOFOL ;  Surgeon: Gaylyn Gladis PENNER, MD;  Location: [facility] ENDOSCOPY;  Service: Endoscopy;  Laterality: N/A;   EYE SURGERY  08/2012   laser surgery on retina/ DR Appenzeller   FINGER SURGERY     repair of tendon in right index, Dr. Nancyann in [facility]     Dr. Verta   IR IMAGING GUIDED PORT INSERTION  05/31/2024   IRRIGATION AND DEBRIDEMENT SEBACEOUS CYST     right upper back   LAPAROSCOPIC SUPRACERVICAL HYSTERECTOMY  06/2007   Dr Kincius/ adhesions/CPP/adenomyosis   MASTECTOMY PARTIAL / LUMPECTOMY Right 01/2008   with sentinal lymph node biopsy/ infiltrating ductal cancer   REPAIR PERONEAL TENDONS ANKLE  10/2019   with tarsal exostectomy and sural neuroma excision on right    SKIN CANCER EXCISION     back and calves   WISDOM TOOTH EXTRACTION  1991    Family History  Problem Relation Age of Onset   Hypertension Mother    Thyroid  disease Mother    Breast cancer Mother 70   Colon cancer Mother 56       again at 35   Atrial fibrillation Mother    Hip fracture Mother    Skin cancer Mother    Stroke Father    Hypertension Father    Cancer Father 46       prostate   Parkinson's disease Father    Skin cancer Father    Atrial fibrillation Sister        Pacemaker inserted end of Sept-beginning of Oct. 2024   Hypertension Sister    Thyroid  disease Sister    Cancer Sister        skin/ squamous cell   Heart Problems Sister    Diabetes Maternal Aunt    Cancer Maternal Aunt    Breast cancer Maternal Aunt 75   Lymphoma Maternal Uncle    Heart disease Maternal Uncle    Melanoma Maternal Uncle 32   Lung cancer Paternal Aunt 45       smoker   Diabetes Maternal Grandmother    Stroke Maternal Grandfather    Heart disease Paternal Grandfather    Thyroid  cancer Cousin        maternal   Breast  cancer Cousin 37       maternal   Breast cancer Cousin 27   Colon cancer Cousin     Social History   Socioeconomic History   Marital status: Married    Spouse name: Marcey   Number of children: 1   Years of education: 14   Highest education level: Not on  file  Occupational History   Occupation: CMA  Tobacco Use   Smoking status: Never   Smokeless tobacco: Never  Vaping Use   Vaping status: Never Used  Substance and Sexual Activity   Alcohol use: Yes    Alcohol/week: 0.0 standard drinks of alcohol    Comment: rare, 1-2 drinks per year   Drug use: No   Sexual activity: Yes    Partners: Male    Birth control/protection: Surgical    Comment: Hysterectomy   Other Topics Concern   Not on file  Social History Narrative   Lives in [place] with husband, no pets.  Works at [employer].      Diet - regular   Exercise - occasional   Social Drivers of Health   Financial Resource Strain: Low Risk  (01/16/2024)   Received from [facility]   Overall Financial Resource Strain (CARDIA)    Difficulty of Paying Living Expenses: Not hard at all  Food Insecurity: No Food Insecurity (01/16/2024)   Received from [facility]   Hunger Vital Sign    Within the past 12 months, you worried that your food would run out before you got the money to buy more.: Never true    Within the past 12 months, the food you bought just didn't last and you didn't have money to get more.: Never true  Transportation Needs: No Transportation Needs (01/16/2024)   Received from [facility] - Transportation    In the past 12 months, has lack of transportation kept you from medical appointments or from getting medications?: No    Lack of Transportation (Non-Medical): No  Physical Activity: Insufficiently Active (07/27/2017)   Exercise Vital Sign    Days of Exercise per Week: 4 days    Minutes of Exercise per Session: 30 min  Stress: No Stress Concern  Present (07/27/2017)   [employer] of Occupational Health - Occupational Stress Questionnaire    Feeling of Stress : Only a little  Social Connections: Socially Integrated (07/27/2017)   Social Connection and Isolation Panel    Frequency of Communication with Friends and Family: More than three times a week    Frequency of Social Gatherings with Friends and Family: Once a week    Attends Religious Services: More than 4 times per year    Active Member of [employer] or Organizations: Yes    Attends Engineer, Structural: More than 4 times per year    Marital Status: Married  Catering Manager Violence: Not At Risk (07/27/2017)   Humiliation, Afraid, Rape, and Kick questionnaire    Fear of Current or Ex-Partner: No    Emotionally Abused: No    Physically Abused: No    Sexually Abused: No     Current Outpatient Medications:    aspirin EC 81 MG tablet, Take by mouth., Disp: , Rfl:    b complex vitamins capsule, Take 1 capsule by mouth daily., Disp: , Rfl:    Blood Glucose Monitoring Suppl (BLOOD GLUCOSE MONITOR SYSTEM) w/Device KIT, Use to test blood sugars in the morning, at noon, and at bedtime., Disp: 1 kit, Rfl: 0   buPROPion  (WELLBUTRIN  XL) 150 MG 24 hr tablet, Take 1 tablet (150 mg total) by mouth daily. Take along a 300mg  tablet for a total daily dose of 450mg ., Disp: 90 tablet, Rfl: 3   buPROPion  (WELLBUTRIN  XL) 300 MG 24 hr tablet, Take 1 tablet (300 mg total) by mouth daily., Disp:  90 tablet, Rfl: 3   busPIRone  (BUSPAR ) 10 MG tablet, Take 1 tablet (10 mg total) by mouth 2 (two) times daily, Disp: 180 tablet, Rfl: 1   calcium carbonate (OSCAL) 1500 (600 Ca) MG TABS tablet, Take by mouth 2 (two) times daily with a meal., Disp: , Rfl:    capivasertib  (TRUQAP ) 160 MG pack, Take 2 tablets (320 mg total) by mouth 2 (two) times daily. Take for 4 days, then hold for 3 days. Repeat every 7 days., Disp: 64 tablet, Rfl: 0   Cholecalciferol (VITAMIN D3) 10 MCG (400 UNIT) CAPS, Take by  mouth., Disp: , Rfl:    citalopram  (CELEXA ) 40 MG tablet, Take 1 tablet (40 mg total) by mouth daily., Disp: 90 tablet, Rfl: 3   diphenhydrAMINE  (BENADRYL ) 25 MG tablet, Take 25 mg by mouth at bedtime as needed for sleep. Pt states daily now for sleep and allergies., Disp: , Rfl:    diphenoxylate -atropine  (LOMOTIL ) 2.5-0.025 MG tablet, Take 2 tablets by mouth 4 (four) times daily as needed for diarrhea or loose stools., Disp: 60 tablet, Rfl: 0   gabapentin  (NEURONTIN ) 100 MG capsule, Take 1 capsule (100 mg total)  By mouth every morning , THEN 2 capsules (200 mg total) every evening., Disp: 270 capsule, Rfl: 0   HYDROmorphone  (DILAUDID ) 2 MG tablet, Take 2 tablets (4 mg total) by mouth every 6 (six) hours as needed for severe pain (pain score 7-10)., Disp: 120 tablet, Rfl: 0   ketotifen  (ZADITOR ) 0.025 % ophthalmic solution, Place 1 drop into both eyes daily. (Patient taking differently: Place 1 drop into both eyes as needed.), Disp: , Rfl:    lidocaine -prilocaine  (EMLA ) cream, Apply to affected area once as directed., Disp: 30 g, Rfl: 3   Magnesium Glycinate 100 MG CAPS, , Disp: , Rfl:    metFORMIN  (GLUCOPHAGE -XR) 500 MG 24 hr tablet, Take 1 tablet (500 mg total) by mouth 2 (two) times daily with a meal., Disp: 180 tablet, Rfl: 1   morphine  (MS CONTIN ) 30 MG 12 hr tablet, Take 1 tablet (30 mg total) by mouth every 8 (eight) hours., Disp: 90 tablet, Rfl: 0   Multiple Vitamin (MULTIVITAMIN) tablet, Take 1 tablet by mouth daily., Disp: , Rfl:    nitrofurantoin , macrocrystal-monohydrate, (MACROBID ) 100 MG capsule, Take 1 capsule (100 mg total) by mouth 2 (two) times daily. (Patient taking differently: Take 100 mg by mouth as needed.), Disp: 14 capsule, Rfl: 0   omeprazole  (PRILOSEC) 20 MG capsule, Take 1 capsule (20 mg total) by mouth daily., Disp: 90 capsule, Rfl: 3   ondansetron  (ZOFRAN ) 4 MG tablet, Take 1 tablet (4 mg total) by mouth every 8 (eight) hours as needed for nausea or vomiting., Disp: 90  tablet, Rfl: 0   ondansetron  (ZOFRAN ) 8 MG tablet, Take 1 tablet (8 mg total) by mouth every 8 (eight) hours as needed for nausea or vomiting. Start on the third day after chemotherapy., Disp: 30 tablet, Rfl: 1   clotrimazole  (LOTRIMIN ) 1 % cream, Apply topically 2 (two) times daily., Disp: , Rfl:      ROS:  Review of Systems  Constitutional:  Positive for fatigue. Negative for fever and unexpected weight change.  Respiratory:  Negative for cough, shortness of breath and wheezing.   Cardiovascular:  Negative for chest pain, palpitations and leg swelling.  Gastrointestinal:  Positive for constipation, diarrhea and nausea. Negative for blood in stool and vomiting.  Endocrine: Negative for cold intolerance, heat intolerance and polyuria.  Genitourinary:  Positive for dyspareunia.  Negative for dysuria, flank pain, frequency, genital sores, hematuria, menstrual problem, pelvic pain, urgency, vaginal bleeding, vaginal discharge and vaginal pain.  Musculoskeletal:  Positive for arthralgias. Negative for back pain, joint swelling and myalgias.  Skin:  Negative for rash.  Neurological:  Negative for dizziness, syncope, light-headedness, numbness and headaches.  Hematological:  Negative for adenopathy.  Psychiatric/Behavioral:  Negative for agitation, confusion, dysphoric mood, sleep disturbance and suicidal ideas. The patient is not nervous/anxious.    BREAST: No symptoms    Objective: BP 122/81   Pulse 86   Ht 5' 3 (1.6 m)   Wt 208 lb (94.3 kg)   BMI 36.85 kg/m    Physical Exam Constitutional:      Appearance: She is well-developed.  Genitourinary:     Vulva normal.     Genitourinary Comments: UTERUS SURG REM     Right Labia: No rash, tenderness or lesions.    Left Labia: No tenderness, lesions or rash.    Vaginal cuff intact.    No vaginal discharge, erythema or tenderness.     Moderate vaginal atrophy present.     Right Adnexa: not tender and no mass present.    Left  Adnexa: not tender and no mass present.    No cervical friability or polyp.     Uterus is not enlarged or tender.     Uterus is absent.  Breasts:    Right: No mass, nipple discharge, skin change or tenderness.     Left: No mass, nipple discharge, skin change or tenderness.  Neck:     Thyroid : No thyromegaly.  Cardiovascular:     Rate and Rhythm: Normal rate and regular rhythm.     Heart sounds: Normal heart sounds. No murmur heard. Pulmonary:     Effort: Pulmonary effort is normal.     Breath sounds: Normal breath sounds.  Abdominal:     Palpations: Abdomen is soft.     Tenderness: There is no abdominal tenderness. There is no guarding or rebound.  Musculoskeletal:        General: Normal range of motion.     Cervical back: Normal range of motion.  Lymphadenopathy:     Cervical: No cervical adenopathy.  Neurological:     General: No focal deficit present.     Mental Status: She is alert and oriented to person, place, and time.     Cranial Nerves: No cranial nerve deficit.  Skin:    General: Skin is warm and dry.  Psychiatric:        Mood and Affect: Mood normal.        Behavior: Behavior normal.        Thought Content: Thought content normal.        Judgment: Judgment normal.  Vitals reviewed.     Assessment/Plan:  Encounter for annual routine gynecological examination  Encounter for screening mammogram for malignant neoplasm of breast - Plan: MM 3D SCREENING MAMMOGRAM BILATERAL BREAST; pt to schedule mammo  Screening for colon cancer - most likely due after 10 yrs since CHEK2 no longer risk for colon cancer.   CHEK2 gene mutation positive - increased risk of breast cancer for women, colon cancer no longer risk.   Recurrent breast cancer, right (HCC) - Plan: MM 3D SCREENING MAMMOGRAM BILATERAL BREAST; followed by onc.   Vaginal dryness, menopausal--check with onc re: adding vag ERT now, f/u if desires and ok per onc.   GYN counsel breast self exam, menopause, ca and  Vit D.  F/U  Return in about 1 year (around 06/01/2025).  Latavious Bitter B. Kianna Billet, PA-C 06/01/2024 4:38 PM

## 2024-05-30 NOTE — Progress Notes (Signed)
 Patient for IR Port Placement on Thurs 05/31/24, I called and spoke with the patient on the phone and gave pre-procedure instructions. Pt was made aware to be here at 10a, NPO after MN prior to procedure as well as driver post procedure/recovery/discharge. Pt stated understanding.  Called 05/30/24

## 2024-05-31 ENCOUNTER — Other Ambulatory Visit: Payer: Self-pay

## 2024-05-31 ENCOUNTER — Encounter: Payer: Self-pay | Admitting: Radiology

## 2024-05-31 ENCOUNTER — Ambulatory Visit
Admission: RE | Admit: 2024-05-31 | Discharge: 2024-05-31 | Disposition: A | Discharge status: Home / Self Care | Source: Ambulatory Visit | Attending: Oncology | Admitting: Oncology

## 2024-05-31 DIAGNOSIS — Z79899 Other long term (current) drug therapy: Secondary | ICD-10-CM | POA: Insufficient documentation

## 2024-05-31 DIAGNOSIS — C7951 Secondary malignant neoplasm of bone: Secondary | ICD-10-CM | POA: Insufficient documentation

## 2024-05-31 DIAGNOSIS — C50912 Malignant neoplasm of unspecified site of left female breast: Secondary | ICD-10-CM | POA: Insufficient documentation

## 2024-05-31 DIAGNOSIS — K589 Irritable bowel syndrome without diarrhea: Secondary | ICD-10-CM | POA: Insufficient documentation

## 2024-05-31 DIAGNOSIS — C50919 Malignant neoplasm of unspecified site of unspecified female breast: Secondary | ICD-10-CM

## 2024-05-31 DIAGNOSIS — I10 Essential (primary) hypertension: Secondary | ICD-10-CM | POA: Insufficient documentation

## 2024-05-31 DIAGNOSIS — K219 Gastro-esophageal reflux disease without esophagitis: Secondary | ICD-10-CM | POA: Insufficient documentation

## 2024-05-31 HISTORY — PX: IR IMAGING GUIDED PORT INSERTION: IMG5740

## 2024-05-31 LAB — GLUCOSE, CAPILLARY: Glucose-Capillary: 92 mg/dL (ref 70–99)

## 2024-05-31 MED ORDER — FENTANYL CITRATE (PF) 100 MCG/2ML IJ SOLN
INTRAMUSCULAR | Status: AC
  Filled 2024-05-31: qty 2

## 2024-05-31 MED ORDER — MIDAZOLAM HCL (PF) 2 MG/2ML IJ SOLN
INTRAMUSCULAR | Status: AC | PRN
  Administered 2024-05-31 (×2): .5 mg via INTRAVENOUS
  Administered 2024-05-31: 1 mg via INTRAVENOUS

## 2024-05-31 MED ORDER — MIDAZOLAM HCL 2 MG/2ML IJ SOLN
INTRAMUSCULAR | Status: AC
  Filled 2024-05-31: qty 2

## 2024-05-31 MED ORDER — HEPARIN SOD (PORK) LOCK FLUSH 100 UNIT/ML IV SOLN
INTRAVENOUS | Status: AC
  Filled 2024-05-31: qty 5

## 2024-05-31 MED ORDER — LIDOCAINE HCL 1 % IJ SOLN
17.0000 mL | Freq: Once | INTRAMUSCULAR | Status: AC
  Administered 2024-05-31: 17 mL via INTRADERMAL

## 2024-05-31 MED ORDER — SODIUM CHLORIDE 0.9 % IV SOLN: INTRAVENOUS | Status: DC

## 2024-05-31 MED ORDER — FENTANYL CITRATE (PF) 100 MCG/2ML IJ SOLN
INTRAMUSCULAR | Status: AC | PRN
  Administered 2024-05-31: 50 ug via INTRAVENOUS
  Administered 2024-05-31 (×2): 25 ug via INTRAVENOUS

## 2024-05-31 MED ORDER — LIDOCAINE HCL 1 % IJ SOLN
INTRAMUSCULAR | Status: AC
  Filled 2024-05-31: qty 20

## 2024-05-31 MED ORDER — HEPARIN SOD (PORK) LOCK FLUSH 100 UNIT/ML IV SOLN
500.0000 [IU] | Freq: Once | INTRAVENOUS | Status: AC
  Administered 2024-05-31: 500 [IU] via INTRAVENOUS

## 2024-05-31 NOTE — H&P (Signed)
 Chief Complaint: metastatic breast cancer  Referring Provider(s): Rao,Archana C  Supervising Physician: Jenna Hacker  Patient Status: [facility] - Out-pt  History of Present Illness: Jacqueline Deleon is a 63 y.o. female with medical history significant for HTN, GERD, IBS, and breast cancer. She was initially diagnosed with stage I right breast cancer in 2009 and had a lumpectomy performed followed by adjuvant radiation therapy. Patient was on tamoxifen for 5 years. She felt a lump in her right axilla in September 2021 and ultrasound of bilateral breasts was negative for a mass. Patient was then hospitalized for covid pneumonia and underwent a CT angio chest on 07/22/2020 which incidentally picked up an axillary mass measuring 2.5 cm. Patient was started on ibrance  plus letrozole  in February 2022. CT chest abdomen pelvis with contrast and bone scan were performed showing worsening metastatic disease in the bones. Patient sees Dr Annah Skene for oncology. Received request for image guided port a catheter placement for treatment.   Confirms NPO since MN and ride/supervision available for 24 hours. She does not wear CPAP or use supplemental home O2. Denies fever, chills, SHOB, CP, sore throat, N/V, abd pain, blood in stool or urine, abnormal bruising, leg swelling, back pain.   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   Chicken pox    Depression    Fatty liver    Fatty liver    Gastric polyps     GERD (gastroesophageal reflux disease)    Gestational diabetes    prediabetes out side of having baby   Headache    migraines   Heart murmur    History of abnormal mammogram 08/2006, 01/2008, 03/2009   Hypertension    IBS (irritable bowel syndrome)    Joint disorder    hx of left ankle tendonitis, bursitis, heel spur   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 Smith/ laprascopic surgery   COLONOSCOPY  04/04/2000   COLONOSCOPY WITH PROPOFOL  N/A 02/12/2019   Procedure: COLONOSCOPY WITH PROPOFOL ;  Surgeon: Gaylyn Gladis PENNER, MD;  Location[facility] ENDOSCOPY;  Service: Endoscopy;  Laterality: N/A;   ESOPHAGOGASTRODUODENOSCOPY (EGD) WITH PROPOFOL  N/A 05/01/2015   Procedure: ESOPHAGOGASTRODUODENOSCOPY (EGD) WITH PROPOFOL ;  Surgeon:  Deward CINDERELLA Piedmont, MD;  Location: [facility] ENDOSCOPY;  Service: Gastroenterology;  Laterality: N/A;   ESOPHAGOGASTRODUODENOSCOPY (EGD) WITH PROPOFOL  N/A 02/12/2019   Procedure: ESOPHAGOGASTRODUODENOSCOPY (EGD) WITH PROPOFOL ;  Surgeon: Gaylyn Gladis PENNER, MD;  Location: [facility] ENDOSCOPY;  Service: Endoscopy;  Laterality: N/A;   EYE SURGERY  08/2012   laser surgery on retina/ DR Appenzeller   FINGER SURGERY     repair of tendon in right index, Dr. Nancyann in [facility]   FOOT SURGERY     Dr. Verta   IRRIGATION AND DEBRIDEMENT SEBACEOUS CYST     right  upper back   LAPAROSCOPIC SUPRACERVICAL HYSTERECTOMY  06/2007   Dr Kincius/ adhesions/CPP/adenomyosis   MASTECTOMY PARTIAL / LUMPECTOMY Right 01/2008   with sentinal lymph node biopsy/ infiltrating ductal cancer   REPAIR PERONEAL TENDONS ANKLE  10/2019   with tarsal exostectomy and sural neuroma excision on right    SKIN CANCER EXCISION     back and calves   WISDOM TOOTH EXTRACTION  1991      Medications: Prior to Admission medications   Medication Sig Start Date End Date Taking? Authorizing Provider  aspirin EC 81 MG tablet Take by mouth. 05/20/22   [provider]  b complex vitamins capsule Take 1 capsule by mouth daily.    [provider]  Blood Glucose Monitoring Suppl (BLOOD GLUCOSE MONITOR SYSTEM) w/Device KIT Use to test blood sugars in the morning, at noon, and at bedtime. 12/29/23   Melanee Annah BROCKS, MD  buPROPion  (WELLBUTRIN  XL) 150 MG 24 hr tablet Take 1 tablet (150 mg total) by mouth daily. Take along a 300mg  tablet for a total daily dose of 450mg . 11/21/23     buPROPion  (WELLBUTRIN  XL) 300 MG 24 hr tablet Take 1 tablet (300 mg total) by mouth daily. 07/27/23     busPIRone  (BUSPAR ) 10 MG tablet Take 1 tablet (10 mg total) by mouth 2 (two) times daily 03/28/24     calcium carbonate (OSCAL) 1500 (600 Ca) MG TABS tablet Take by mouth 2 (two) times daily with a meal.    [provider]  capivasertib  (TRUQAP ) 160 MG pack Take 2 tablets (320 mg total) by mouth 2 (two) times daily. Take for 4 days, then hold for 3 days. Repeat every 7 days. 05/15/24   Rao, Archana C, MD  Cholecalciferol (VITAMIN D3) 10 MCG (400 UNIT) CAPS Take by mouth.    [provider]  citalopram  (CELEXA ) 40 MG tablet Take 1 tablet (40 mg total) by mouth daily. 09/09/23     diphenhydrAMINE  (BENADRYL ) 25 MG tablet Take 25 mg by mouth at bedtime as needed for sleep. Pt states daily now for sleep and allergies.    [provider]  diphenoxylate -atropine  (LOMOTIL ) 2.5-0.025 MG  tablet Take 2 tablets by mouth 4 (four) times daily as needed for diarrhea or loose stools. 05/14/24   Melanee Annah BROCKS, MD  gabapentin  (NEURONTIN ) 100 MG capsule Take 1 capsule (100 mg total) by mouth every morning AND 2 capsules (200 mg total) every evening. 12/12/23     gabapentin  (NEURONTIN ) 100 MG capsule Take 1 capsule (100 mg total)  By mouth every morning , THEN 2 capsules (200 mg total) every evening. 05/28/24 11/24/24  Diedra Lame, MD  HYDROmorphone  (DILAUDID ) 2 MG tablet Take 2 tablets (4 mg total) by mouth every 6 (six) hours as needed for severe pain (pain score 7-10). 05/30/24   Melanee Annah BROCKS, MD  ketotifen  (ZADITOR ) 0.025 % ophthalmic solution Place 1 drop into both  eyes daily. Patient taking differently: Place 1 drop into both eyes as needed.    [provider]  lidocaine -prilocaine (EMLA) cream Apply to affected area once as directed. 05/26/24   Melanee Annah BROCKS, MD  Magnesium Glycinate 100 MG CAPS  09/26/23   [provider]  metFORMIN  (GLUCOPHAGE -XR) 500 MG 24 hr tablet Take 1 tablet (500 mg total) by mouth 2 (two) times daily with a meal. 12/12/23     morphine  (MS CONTIN ) 30 MG 12 hr tablet Take 1 tablet (30 mg total) by mouth every 8 (eight) hours. 05/14/24   Rao, Archana C, MD  Multiple Vitamin (MULTIVITAMIN) tablet Take 1 tablet by mouth daily.    [provider]  nitrofurantoin , macrocrystal-monohydrate, (MACROBID ) 100 MG capsule Take 1 capsule (100 mg total) by mouth 2 (two) times daily. Patient taking differently: Take 100 mg by mouth as needed. 03/26/24   Melanee Annah BROCKS, MD  omeprazole  (PRILOSEC) 20 MG capsule Take 1 capsule (20 mg total) by mouth daily. 04/18/24   Melanee Annah BROCKS, MD  ondansetron  (ZOFRAN ) 4 MG tablet Take 1 tablet (4 mg total) by mouth every 8 (eight) hours as needed for nausea or vomiting. 01/18/23   Tonette Lauraine HERO, PA-C  ondansetron  (ZOFRAN ) 8 MG tablet Take 1 tablet (8 mg total) by mouth every 8 (eight) hours as needed for nausea or  vomiting. Start on the third day after chemotherapy. 05/26/24   Melanee Annah BROCKS, MD     Family History  Problem Relation Age of Onset   Hypertension Mother    Thyroid  disease Mother    Breast cancer Mother 95   Colon cancer Mother 24       again at 54   Atrial fibrillation Mother    Hip fracture Mother    Skin cancer Mother    Stroke Father    Hypertension Father    Cancer Father 72       prostate   Parkinson's disease Father    Skin cancer Father    Atrial fibrillation Sister        Pacemaker inserted end of Sept-beginning of Oct. 2024   Hypertension Sister    Thyroid  disease Sister    Cancer Sister        skin/ squamous cell   Heart Problems Sister    Diabetes Maternal Aunt    Cancer Maternal Aunt    Breast cancer Maternal Aunt 2   Lymphoma Maternal Uncle    Heart disease Maternal Uncle    Melanoma Maternal Uncle 20   Lung cancer Paternal Aunt 58       smoker   Diabetes Maternal Grandmother    Stroke Maternal Grandfather    Heart disease Paternal Grandfather    Thyroid  cancer Cousin        maternal   Breast cancer Cousin 60       maternal   Breast cancer Cousin 62   Colon cancer Cousin     Social History   Socioeconomic History   Marital status: Married    Spouse name: Marcey   Number of children: 1   Years of education: 14   Highest education level: Not on file  Occupational History   Occupation: CMA  Tobacco Use   Smoking status: Never   Smokeless tobacco: Never  Vaping Use   Vaping status: Never Used  Substance and Sexual Activity   Alcohol use: Yes    Alcohol/week: 0.0 standard drinks of alcohol    Comment: rare, 1-2  drinks per year   Drug use: No   Sexual activity: Yes    Partners: Male    Birth control/protection: Surgical    Comment: Hysterectomy   Other Topics Concern   Not on file  Social History Narrative   Lives in [place] with husband, no pets.  Works at [employer].      Diet - regular   Exercise - occasional   Social Drivers of  Health   Financial Resource Strain: Low Risk  (01/16/2024)   Received from [facility]   Overall Financial Resource Strain (CARDIA)    Difficulty of Paying Living Expenses: Not hard at all  Food Insecurity: No Food Insecurity (01/16/2024)   Received from [facility]   Hunger Vital Sign    Within the past 12 months, you worried that your food would run out before you got the money to buy more.: Never true    Within the past 12 months, the food you bought just didn't last and you didn't have money to get more.: Never true  Transportation Needs: No Transportation Needs (01/16/2024)   Received [facility] - [facility]    In the past 12 months, has lack of transportation kept you from medical appointments or from getting medications?: No    Lack of Transportation (Non-Medical): No  Physical Activity: Insufficiently Active (07/27/2017)   Exercise Vital Sign    Days of Exercise per Week: 4 days    Minutes of Exercise per Session: 30 min  Stress: No Stress Concern Present (07/27/2017)   [employer] of Occupational Health - Occupational Stress Questionnaire    Feeling of Stress : Only a little  Social Connections: Socially Integrated (07/27/2017)   Social Connection and Isolation Panel    Frequency of Communication with Friends and Family: More than three times a week    Frequency of Social Gatherings with Friends and Family: Once a week    Attends Religious Services: More than 4 times per year    Active Member of [employer] or Organizations: Yes    Attends Engineer, Structural: More than 4 times per year    Marital Status: Married     Review of Systems: A 12 point ROS discussed and pertinent positives are indicated in the HPI above.  All other systems are negative.    Vital Signs: BP 130/69   Pulse 72   Temp (!) 97 F (36.1 C) (Temporal)   Resp 15   SpO2 95%     Physical Exam HENT:     Mouth/Throat:      Mouth: Mucous membranes are moist.  Cardiovascular:     Rate and Rhythm: Normal rate and regular rhythm.  Pulmonary:     Effort: Pulmonary effort is normal. No respiratory distress.     Breath sounds: Normal breath sounds.  Abdominal:     General: Bowel sounds are normal.     Palpations: Abdomen is soft.     Tenderness: There is no abdominal tenderness.  Skin:    General: Skin is warm and dry.  Neurological:     Mental Status: She is alert and oriented to person, place, and time.  Psychiatric:        Mood and Affect: Mood normal.        Behavior: Behavior normal.     Imaging: NM Bone Scan Whole Body Result Date: 05/16/2024 EXAM: NM WHOLE BODY BONE SCAN 05/14/2024 02:33:00 PM TECHNIQUE: Anterior and posterior  whole body images were obtained at least 3 hours following the intravenous administration of radiopharmaceutical. RADIOPHARMACEUTICAL: 21.12 mCi Tc-47m MDP COMPARISON: 02/08/2024 CLINICAL HISTORY: breast cancer mets to bone FINDINGS: BONES: Similar appearance of widespread metastatic disease to bone including the ribs, spine, sacrum, right acetabulum, bilateral humeral shafts, intertrochanteric region of the right femur, left femoral shaft, and possibly the calvarium. Degenerative findings in the shoulders, knees, and ankle regions. SOFT TISSUES: Physiologic activity within the kidneys and urinary bladder. IMPRESSION: 1. Similar appearance on distribution of widespread metastatic disease including involvement of the ribs, spine, sacrum, right acetabulum, bilateral humeral shafts, intertrochanteric region of the right femur, left femoral shaft, and possibly the calvarium. 2. Degenerative changes in the shoulders, knees, and ankles. Electronically signed by: Ryan Salvage MD 05/16/2024 09:30 AM EST RP Workstation: [id number]   MR LIVER W WO CONTRAST Result Date: 05/16/2024 EXAM: MRCP WITH AND WITHOUT IV CONTRAST 05/15/2024 01:48:05 PM TECHNIQUE: Multisequence, multiplanar magnetic  resonance images of the abdomen with and without intravenous contrast. MRCP sequences were performed. 10 mL gadobutrol  (GADAVIST ) 1 MMOL/ML injection was administered. COMPARISON: 03/02/2024 CLINICAL HISTORY: liver metastases; breast cancer FINDINGS: LIVER: Numerous scattered small metastatic lesions throughout all lobes of the liver appear mildly increased in size and number compared to 03/02/2024. For example, on image 70 of series 11, foci of abnormal diffusion weighted imaging posteriorly in the right hepatic lobe are visibly increased compared to 03/02/2024. Some individual lesions are relatively similar including the 1.1 cm lesion posteriorly in the right hepatic lobe on image 66 series 11. Individual lesions measure up to about 1.3 cm in diameter with most of the lesions subcentimeter in size. These lesions have faintly accentuated T2 signal and the larger lesions demonstrate moderate arterial phase enhancement. The widespread distribution of the lesions and conspicuity of the smaller lesions is highest on the diffusion weighted images. GALLBLADDER AND BILIARY SYSTEM: Gallbladder is absent. No intrahepatic or extrahepatic ductal dilation. SPLEEN: Unremarkable. PANCREAS/PANCREATIC DUCT: Visualized pancreas is unremarkable. No pancreatic ductal dilatation. ADRENAL GLANDS: Unremarkable. 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 LYMPH NODES: No enlarged abdominal lymph nodes. VASCULATURE: Unremarkable. PERITONEUM: No ascites. ABDOMINAL WALL: No hernia. No mass. BOWEL: Prominence of stool throughout the colon, favoring constipation. No bowel obstruction. BONES: Widespread diffuse osseous metastatic disease. 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 SOFT TISSUES: Unremarkable. MISCELLANEOUS: Unremarkable. IMPRESSION: 1. Numerous hepatic metastases mildly increased in size and number  compared to 03/02/24. 2. Widespread diffuse osseous metastatic disease. 3. 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 4. No biliary dilatation. Gallbladder absent. 5. Prominence of stool throughout the colon, favoring constipation. Electronically signed by: Ryan Salvage MD 05/16/2024 09:18 AM EST RP Workstation: [id number]   CT Chest Wo Contrast Result Date: 05/16/2024 EXAM: CT CHEST WITHOUT CONTRAST 05/15/2024 01:40:56 PM TECHNIQUE: CT of the chest was performed without the administration of intravenous contrast. Multiplanar reformatted images are provided for review. Automated exposure control, iterative reconstruction, and/or weight based adjustment of the mA/kV was utilized to reduce the radiation dose to as low as reasonably achievable. COMPARISON: Multiple exams including PET CT 03/19/2024. CLINICAL HISTORY: Breast cancer metastatic to bone restaging assessment. * Tracking Code: BO * FINDINGS: MEDIASTINUM: Heart and pericardium are unremarkable. The central airways are clear. LYMPH NODES: Node 0.9 cm in short axis on image 67 series 2, within normal limits. Several tiny perifissural nodules are likely  lymph nodes. No axillary lymphadenopathy. LUNGS AND PLEURA: 6 x 4 mm anterior right middle lobe nodule on image 78 series 4. Previous patchy consolidation in the left lower lobe has resolved. No pulmonary edema. No pleural effusion or pneumothorax. SOFT TISSUES/BONES: Roughly stable appearance of widespread scattered lytic and sclerotic metastatic lesions in the thoracic spine, ribs, bilateral scapulae, bilateral clavicle, and sternum compatible with diffuse/widespread osseous metastatic disease. 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 Previously seen liver lesions are not well characterized on today's noncontrast exam. IMPRESSION: 1. Stable widespread osseous  metastatic disease involving the thoracic spine, ribs, bilateral scapulae, bilateral clavicle, and sternum. 2. 6 x 4 mm anterior right middle lobe pulmonary nodule; surveillance in the context of the patient anticipated follow-up oncology imaging suggestive. 3. Resolved patchy consolidation in the left lower lobe. Electronically signed by: Ryan Salvage MD 05/16/2024 09:02 AM EST RP Workstation: [id number]    Labs:  CBC: Recent Labs    04/05/24 1428 04/11/24 1401 04/24/24 1230 05/23/24 1432  WBC 6.0 5.1 8.0 5.2  HGB 10.0* 9.8* 10.2* 10.2*  HCT 32.6* 31.8* 33.4* 33.7*  PLT 206 234 232 255    COAGS: Recent Labs    03/15/24 0828  INR 1.2    BMP: Recent Labs    03/16/24 1440 03/27/24 1236 04/24/24 1231 05/23/24 1432  NA 140 137 138 137  K 4.2 4.4 4.9 4.2  CL 106 104 104 102  CO2 23 24 23 25   GLUCOSE 112* 131* 126* 144*  BUN 14 20 16 17   CALCIUM 8.8* 9.2 8.3* 8.9  CREATININE 1.15* 1.36* 1.46* 1.53*  GFRNONAA 54* 44* 41* 38*    LIVER FUNCTION TESTS: Recent Labs    03/16/24 1440 03/27/24 1236 04/24/24 1231 05/23/24 1432  BILITOT 0.3 0.5 0.5 0.4  AST 26 26 36 26  ALT 47* 24 15 17   ALKPHOS 151* 137* 113 102  PROT 6.5 6.9 6.6 7.0  ALBUMIN 3.1* 3.3* 3.3* 3.4*    TUMOR MARKERS: No results for input(s): AFPTM, CEA, CA199, CHROMGRNA in the last 8760 hours.  Assessment and Plan: Metastatic breast cancer-  Request for image guided port a catheter placement.  No contraindications for procedure identified in ROS, physical exam, or review of pre-sedation considerations.  Labs reviewed and within acceptable range Imaging available and reviewed VSS, afebrile   Risks and benefits of image guided port-a-catheter placement was discussed with the patient including, but not limited to bleeding, infection, pneumothorax, or fibrin sheath development and need for additional procedures.  All of the patient's questions were answered, patient is agreeable to  proceed. Consent signed and in chart.   Thank you for allowing our service to participate in Jacqueline Deleon 's care.    Electronically Signed: Yosselin Zoeller B Ravis Herne, NP   05/31/2024, 10:24 AM     I spent a total of 15 Minutes in face to face in clinical consultation, greater than 50% of which was counseling/coordinating care for image guided port a catheter placement.   (A copy of this note was sent to the referring provider and the time of visit.)

## 2024-06-01 ENCOUNTER — Inpatient Hospital Stay

## 2024-06-01 ENCOUNTER — Encounter: Payer: Self-pay | Admitting: Obstetrics and Gynecology

## 2024-06-01 ENCOUNTER — Encounter: Payer: Self-pay | Admitting: Oncology

## 2024-06-01 ENCOUNTER — Ambulatory Visit (INDEPENDENT_AMBULATORY_CARE_PROVIDER_SITE_OTHER): Admitting: Obstetrics and Gynecology

## 2024-06-01 ENCOUNTER — Inpatient Hospital Stay: Admitting: Oncology

## 2024-06-01 VITALS — BP 122/81 | HR 86 | Ht 63.0 in | Wt 208.0 lb

## 2024-06-01 DIAGNOSIS — Z1211 Encounter for screening for malignant neoplasm of colon: Secondary | ICD-10-CM

## 2024-06-01 DIAGNOSIS — N951 Menopausal and female climacteric states: Secondary | ICD-10-CM

## 2024-06-01 DIAGNOSIS — Z01419 Encounter for gynecological examination (general) (routine) without abnormal findings: Secondary | ICD-10-CM | POA: Diagnosis not present

## 2024-06-01 DIAGNOSIS — Z90710 Acquired absence of both cervix and uterus: Secondary | ICD-10-CM

## 2024-06-01 DIAGNOSIS — Z1589 Genetic susceptibility to other disease: Secondary | ICD-10-CM

## 2024-06-01 DIAGNOSIS — Z1231 Encounter for screening mammogram for malignant neoplasm of breast: Secondary | ICD-10-CM

## 2024-06-01 DIAGNOSIS — C50911 Malignant neoplasm of unspecified site of right female breast: Secondary | ICD-10-CM | POA: Diagnosis not present

## 2024-06-01 NOTE — Patient Instructions (Addendum)
 I value your feedback and you entrusting [place] with your care. If you get a [facility] patient survey, I would appreciate you taking the time to let us know about your experience today. Thank you!  [facility] ([place])--[phone]

## 2024-06-04 ENCOUNTER — Other Ambulatory Visit: Payer: Self-pay

## 2024-06-04 ENCOUNTER — Other Ambulatory Visit (HOSPITAL_COMMUNITY): Payer: Self-pay

## 2024-06-06 ENCOUNTER — Ambulatory Visit
Admission: RE | Admit: 2024-06-06 | Discharge: 2024-06-06 | Disposition: A | Discharge status: Home / Self Care | Source: Ambulatory Visit | Attending: Oncology | Admitting: Oncology

## 2024-06-06 DIAGNOSIS — C50919 Malignant neoplasm of unspecified site of unspecified female breast: Secondary | ICD-10-CM | POA: Insufficient documentation

## 2024-06-06 DIAGNOSIS — Z79899 Other long term (current) drug therapy: Secondary | ICD-10-CM | POA: Diagnosis not present

## 2024-06-06 DIAGNOSIS — C799 Secondary malignant neoplasm of unspecified site: Secondary | ICD-10-CM | POA: Diagnosis not present

## 2024-06-06 DIAGNOSIS — R011 Cardiac murmur, unspecified: Secondary | ICD-10-CM | POA: Diagnosis present

## 2024-06-06 LAB — ECHOCARDIOGRAM COMPLETE
AR max vel: 2.76 cm2
AV Area VTI: 2.48 cm2
AV Area mean vel: 3.03 cm2
AV Mean grad: 6 mmHg
AV Peak grad: 9.7 mmHg
Ao pk vel: 1.56 m/s
Area-P 1/2: 4.1 cm2
MV VTI: 3.36 cm2
S' Lateral: 2.4 cm

## 2024-06-06 NOTE — Progress Notes (Signed)
*  PRELIMINARY RESULTS* Echocardiogram 2D Echocardiogram has been performed.  Floydene Harder 06/06/2024, 11:22 AM

## 2024-06-11 ENCOUNTER — Inpatient Hospital Stay

## 2024-06-11 ENCOUNTER — Other Ambulatory Visit: Payer: Self-pay | Admitting: Oncology

## 2024-06-11 ENCOUNTER — Encounter: Payer: Self-pay | Admitting: Oncology

## 2024-06-11 ENCOUNTER — Other Ambulatory Visit: Payer: Self-pay

## 2024-06-11 ENCOUNTER — Other Ambulatory Visit (HOSPITAL_COMMUNITY): Payer: Self-pay

## 2024-06-11 ENCOUNTER — Inpatient Hospital Stay: Attending: Oncology | Admitting: Oncology

## 2024-06-11 ENCOUNTER — Inpatient Hospital Stay: Attending: Oncology

## 2024-06-11 VITALS — BP 120/70 | HR 89 | Temp 98.0°F | Resp 20 | Wt 200.5 lb

## 2024-06-11 DIAGNOSIS — D701 Agranulocytosis secondary to cancer chemotherapy: Secondary | ICD-10-CM | POA: Insufficient documentation

## 2024-06-11 DIAGNOSIS — C7951 Secondary malignant neoplasm of bone: Secondary | ICD-10-CM | POA: Insufficient documentation

## 2024-06-11 DIAGNOSIS — Z5112 Encounter for antineoplastic immunotherapy: Secondary | ICD-10-CM | POA: Insufficient documentation

## 2024-06-11 DIAGNOSIS — Z853 Personal history of malignant neoplasm of breast: Secondary | ICD-10-CM | POA: Diagnosis not present

## 2024-06-11 DIAGNOSIS — Z170 Estrogen receptor positive status [ER+]: Secondary | ICD-10-CM | POA: Insufficient documentation

## 2024-06-11 DIAGNOSIS — C50911 Malignant neoplasm of unspecified site of right female breast: Secondary | ICD-10-CM

## 2024-06-11 DIAGNOSIS — Z1731 Human epidermal growth factor receptor 2 positive status: Secondary | ICD-10-CM | POA: Diagnosis not present

## 2024-06-11 DIAGNOSIS — T451X5A Adverse effect of antineoplastic and immunosuppressive drugs, initial encounter: Secondary | ICD-10-CM | POA: Diagnosis not present

## 2024-06-11 DIAGNOSIS — C787 Secondary malignant neoplasm of liver and intrahepatic bile duct: Secondary | ICD-10-CM

## 2024-06-11 DIAGNOSIS — C50919 Malignant neoplasm of unspecified site of unspecified female breast: Secondary | ICD-10-CM

## 2024-06-11 DIAGNOSIS — D649 Anemia, unspecified: Secondary | ICD-10-CM | POA: Diagnosis not present

## 2024-06-11 LAB — CMP (CANCER CENTER ONLY)
ALT: 36 U/L (ref 0–44)
AST: 55 U/L — ABNORMAL HIGH (ref 15–41)
Albumin: 3.6 g/dL (ref 3.5–5.0)
Alkaline Phosphatase: 200 U/L — ABNORMAL HIGH (ref 38–126)
Anion gap: 13 (ref 5–15)
BUN: 12 mg/dL (ref 8–23)
CO2: 26 mmol/L (ref 22–32)
Calcium: 9.4 mg/dL (ref 8.9–10.3)
Chloride: 101 mmol/L (ref 98–111)
Creatinine: 0.77 mg/dL (ref 0.44–1.00)
GFR, Estimated: >60 mL/min (ref >60)
Glucose, Bld: 110 mg/dL — ABNORMAL HIGH (ref 70–99)
Potassium: 4.2 mmol/L (ref 3.5–5.1)
Sodium: 139 mmol/L (ref 135–145)
Total Bilirubin: 0.5 mg/dL (ref 0.0–1.2)
Total Protein: 7.1 g/dL (ref 6.5–8.1)

## 2024-06-11 LAB — CBC WITH DIFFERENTIAL (CANCER CENTER ONLY)
Abs Immature Granulocytes: 0.17 K/uL — ABNORMAL HIGH (ref 0.00–0.07)
Basophils Absolute: 0 K/uL (ref 0.0–0.1)
Basophils Relative: 0 %
Eosinophils Absolute: 0 K/uL (ref 0.0–0.5)
Eosinophils Relative: 0 %
HCT: 30.6 % — ABNORMAL LOW (ref 36.0–46.0)
Hemoglobin: 9.7 g/dL — ABNORMAL LOW (ref 12.0–15.0)
Immature Granulocytes: 2 %
Lymphocytes Relative: 6 %
Lymphs Abs: 0.5 K/uL — ABNORMAL LOW (ref 0.7–4.0)
MCH: 24.9 pg — ABNORMAL LOW (ref 26.0–34.0)
MCHC: 31.7 g/dL (ref 30.0–36.0)
MCV: 78.7 fL — ABNORMAL LOW (ref 80.0–100.0)
Monocytes Absolute: 0.8 K/uL (ref 0.1–1.0)
Monocytes Relative: 9 %
Neutro Abs: 7.9 K/uL — ABNORMAL HIGH (ref 1.7–7.7)
Neutrophils Relative %: 83 %
Platelet Count: 285 K/uL (ref 150–400)
RBC: 3.89 MIL/uL (ref 3.87–5.11)
RDW: 18.7 % — ABNORMAL HIGH (ref 11.5–15.5)
WBC Count: 9.5 K/uL (ref 4.0–10.5)
nRBC: 0.3 % — ABNORMAL HIGH (ref 0.0–0.2)

## 2024-06-11 LAB — IRON AND TIBC
Iron: 31 ug/dL (ref 28–170)
Saturation Ratios: 13 % (ref 10.4–31.8)
TIBC: 235 ug/dL — ABNORMAL LOW (ref 250–450)
UIBC: 205 ug/dL

## 2024-06-11 LAB — FERRITIN: Ferritin: 867 ng/mL — ABNORMAL HIGH (ref 11–307)

## 2024-06-11 MED ORDER — HYDROMORPHONE HCL 2 MG PO TABS: 4.0000 mg | ORAL_TABLET | Freq: Four times a day (QID) | ORAL | 0 refills | Status: DC | PRN

## 2024-06-11 MED ORDER — FAM-TRASTUZUMAB DERUXTECAN-NXKI CHEMO 100 MG IV SOLR
5.4000 mg/kg | Freq: Once | INTRAVENOUS | Status: AC
  Administered 2024-06-11: 500 mg via INTRAVENOUS
  Filled 2024-06-11: qty 25

## 2024-06-11 MED ORDER — DEXAMETHASONE SOD PHOSPHATE PF 10 MG/ML IJ SOLN: 10.0000 mg | Freq: Once | INTRAMUSCULAR | Status: DC

## 2024-06-11 MED ORDER — PROCHLORPERAZINE EDISYLATE 10 MG/2ML IJ SOLN
10.0000 mg | Freq: Once | INTRAMUSCULAR | Status: AC
  Administered 2024-06-11: 10 mg via INTRAVENOUS
  Filled 2024-06-11: qty 2

## 2024-06-11 MED ORDER — ACETAMINOPHEN 325 MG PO TABS
650.0000 mg | ORAL_TABLET | Freq: Once | ORAL | Status: AC
  Administered 2024-06-11: 650 mg via ORAL
  Filled 2024-06-11: qty 2

## 2024-06-11 MED ORDER — MORPHINE SULFATE ER 30 MG PO TBCR: 30.0000 mg | EXTENDED_RELEASE_TABLET | Freq: Three times a day (TID) | ORAL | 0 refills | Status: DC

## 2024-06-11 MED ORDER — PALONOSETRON HCL INJECTION 0.25 MG/5ML
0.2500 mg | Freq: Once | INTRAVENOUS | Status: AC
  Administered 2024-06-11: 0.25 mg via INTRAVENOUS
  Filled 2024-06-11: qty 5

## 2024-06-11 MED ORDER — SODIUM CHLORIDE 0.9 % IV SOLN
150.0000 mg | Freq: Once | INTRAVENOUS | Status: AC
  Administered 2024-06-11: 150 mg via INTRAVENOUS
  Filled 2024-06-11: qty 150

## 2024-06-11 MED ORDER — DIPHENHYDRAMINE HCL 25 MG PO TABS
50.0000 mg | ORAL_TABLET | Freq: Once | ORAL | Status: AC
  Administered 2024-06-11: 50 mg via ORAL
  Filled 2024-06-11: qty 2

## 2024-06-11 MED ORDER — DEXTROSE 5 % IV SOLN
INTRAVENOUS | Status: DC
  Filled 2024-06-11: qty 250

## 2024-06-11 NOTE — Patient Instructions (Signed)
 [facility] - A DEPT OF [facility]  Discharge Instructions: Thank you for choosing [facility] to provide your oncology and hematology care.  If you have a lab appointment with the Cancer Center, please go directly to the Cancer Center and check in at the registration area.  Wear comfortable clothing and clothing appropriate for easy access to any Portacath or PICC line.   We strive to give you quality time with your provider. You may need to reschedule your appointment if you arrive late (15 or more minutes).  Arriving late affects you and other patients whose appointments are after yours.  Also, if you miss three or more appointments without notifying the office, you may be dismissed from the clinic at the provider's discretion.      For prescription refill requests, have your pharmacy contact our office and allow 72 hours for refills to be completed.    Today you received the following chemotherapy and/or immunotherapy agents:trastuzumab     To help prevent nausea and vomiting after your treatment, we encourage you to take your nausea medication as directed.  BELOW ARE SYMPTOMS THAT SHOULD BE REPORTED IMMEDIATELY: *FEVER GREATER THAN 100.4 F (38 C) OR HIGHER *CHILLS OR SWEATING *NAUSEA AND VOMITING THAT IS NOT CONTROLLED WITH YOUR NAUSEA MEDICATION *UNUSUAL SHORTNESS OF BREATH *UNUSUAL BRUISING OR BLEEDING *URINARY PROBLEMS (pain or burning when urinating, or frequent urination) *BOWEL PROBLEMS (unusual diarrhea, constipation, pain near the anus) TENDERNESS IN MOUTH AND THROAT WITH OR WITHOUT PRESENCE OF ULCERS (sore throat, sores in mouth, or a toothache) UNUSUAL RASH, SWELLING OR PAIN  UNUSUAL VAGINAL DISCHARGE OR ITCHING   Items with * indicate a potential emergency and should be followed up as soon as possible or go to the Emergency Department if any problems should occur.  Please show the CHEMOTHERAPY ALERT CARD or IMMUNOTHERAPY  ALERT CARD at check-in to the Emergency Department and triage nurse.  Should you have questions after your visit or need to cancel or reschedule your appointment, please contact [facility] - A DEPT OF [facility]  [phone] and follow the prompts.  Office hours are 8:00 a.m. to 4:30 p.m. Monday - Friday. Please note that voicemails left after 4:00 p.m. may not be returned until the following business day.  We are closed weekends and major holidays. You have access to a nurse at all times for urgent questions. Please call the main number to the clinic [phone] and follow the prompts.  For any non-urgent questions, you may also contact your provider using MyChart. We now offer e-Visits for anyone 26 and older to request care online for non-urgent symptoms. For details visit [email or web address].   Also download the MyChart app! Go to the app store, search "MyChart", open the app, select [place], and log in with your MyChart username and password.

## 2024-06-11 NOTE — Progress Notes (Signed)
 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  It was a 1.4 cm tumor with negative lymph nodes grade 1 and patient went on to get lumpectomy followed by adjuvant radiation therapy.  Oncotype DX score was 6 and she did not require adjuvant chemotherapy.  She was on tamoxifen for 5 years.  She had BRCA testing done at that time which was negative.   Patient felt a lump in her right axilla in September 2021.  She underwent ultrasound of bilateral breasts which did not pick up any axillary mass.  A prior screening mammogram in September 2021 was unremarkable.  She was then admitted for Covid pneumonia and underwent CT angio chest on 07/22/2020 which incidentally picked up an axillary mass measuring 2.5 cm.  No obvious breast mass.  Axillary mass was biopsied and was consistent with high-grade invasive mammary carcinoma.  IHC was positive for GATA3 with diffuse strong nuclear staining.  There was no lymph node architecture identified in the sample and it could not be determined if it is a primary tumor within the axillary breast tissue or metastases.  ER strongly positive greater than 90%, PR 1 to 10% positive and HER-2 negative- 0   PET CT scan showed hypermetabolic poorly marginated solid right axillary mass 2.5 x 2 cm with an SUV of 5.6.  No enlarged hypermetabolic mediastinal or hilar lymph nodes no enlarged left  axillary lymph nodes.  Subcentimeter lung nodules below PET resolution.  Small hypermetabolism in the right liver with an SUV of 6 without CT correlate equivocal for liver metastases.  Multiple hypermetabolic faintly lytic osseous lesions throughout the lumbar spine and bilateral pelvic girdle with representative supra-acetabular right iliac bone 1.9 cm lesion, anterior superior left acetabular 1.7 cm lesion and L1 1.2 cm lesion.   NGS testing showed no actionable mutations.  PD-L1 less than 1%.  CHEK2 S422 FS, PIK3CA N1068 FS, PIK3CA N345H, CCN E1 gain, ERB B2 1 P-CSF 1R fusion, FGF 19 gain, FGF 3 gain, FGF 4gain.   Ibrance  plus letrozole  started in February 2022.    Patient noted to have increase in CA 27-29 from 26 and November 2023 203 in July 2024.  CT chest abdomen pelvis with contrast and bone scan showed worsening metastatic disease in the bones especially in the thoracolumbar spine and right hemipelvis.    Peripheral blood NGS testing showed CHEK2 mutation, PIK3CA P.N1068 FS, PIK3CA P.N345H, PTEN P.R173S, ESR 1P.D538G, ESR 1P.L536R, PIK3CA P.R88Q.  MSI high not detected  Elacestrant  started in sept 2024.  Disease progression noted in June 2025 and given the presence of PIK3CA mutation patient switched capivasertib  plus fulvestrant    Disease progression noted in November 2025 based on increase in the size of liver lesions.  Repeat liver biopsy confirmed ER 30%, PR negative breast cancer.  Her tumor was not reported to be -0 by our pathology but was noted to be +2 on [facility] testing.  Plan is therefore to  proceed with Enhertu first before considering systemic chemotherapy     Interval history- Discussed the use of AI scribe software for clinical note transcription with the patient, who gave verbal consent to proceed.  History of Present Illness   Jacqueline Deleon is a 63 year old female with metastatic breast cancer who presents for initiation of Enhertu treatment. She is accompanied by her  sister, Jacqueline Deleon.  She is undergoing treatment for metastatic breast cancer with liver involvement and is starting Enhertu. HER2 testing on her liver specimen was performed in two different ways; the patient recalls that one test did not show HER2 expression, while another test (Tempest) showed HER2 plus two. Her heart scan showed an ejection fraction of 60-65%.  She has experienced increased pain over the past week, despite taking Dilaudid  and morphine . She takes morphine  three times a day as a long-acting pain medication and Dilaudid  as needed. She is concerned about daily pain medication use.  She experiences increased emotional distress, with frequent crying episodes over the past six months. She is currently on Buspar , Wellbutrin , and Celexa , prescribed by her primary care doctor. She maintains social connections and engages in activities that make her happy.  She experiences excessive sweating regardless of the temperature, which she attributes to either her cancer or medication side effects. She also reports twitching in various parts of her body, which is not constant. She is concerned about the possibility of seizures.      ECOG PS- 2 Pain scale- 3 Opioid associated constipation- no  Review of systems- Review of Systems  Constitutional:  Positive for malaise/fatigue. Negative for chills, fever and weight loss.  HENT:  Negative for congestion, ear discharge and nosebleeds.   Eyes:  Negative for blurred vision.  Respiratory:  Negative for cough, hemoptysis, sputum production, shortness of breath and wheezing.   Cardiovascular:  Negative for chest pain, palpitations, orthopnea and claudication.  Gastrointestinal:  Negative for abdominal pain, blood in stool, constipation, diarrhea, heartburn, melena, nausea and vomiting.  Genitourinary:  Negative for dysuria, flank pain, frequency, hematuria and urgency.  Musculoskeletal:  Positive for joint pain. Negative for back pain and myalgias.   Skin:  Negative for rash.  Neurological:  Negative for dizziness, tingling, focal weakness, seizures, weakness and headaches.  Endo/Heme/Allergies:  Does not bruise/bleed easily.  Psychiatric/Behavioral:  Negative for depression and suicidal ideas. The patient does not have insomnia.       Allergies  Allergen Reactions   Kiwi Extract Itching and Swelling    The real kiwi fruit   Codeine Other (See Comments)    Felt funny.   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   Chicken pox    Depression    Fatty liver    Fatty liver    Gastric polyps    GERD (gastroesophageal reflux disease)  Gestational diabetes    prediabetes out side of having baby   Headache    migraines   Heart murmur    History of abnormal mammogram 08/2006, 01/2008, 03/2009   Hypertension    IBS (irritable bowel syndrome)    Joint disorder    hx of left ankle tendonitis, bursitis, heel spur   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 Smith/ laprascopic surgery   COLONOSCOPY  04/04/2000   COLONOSCOPY WITH PROPOFOL  N/A 02/12/2019   Procedure: COLONOSCOPY WITH PROPOFOL ;  Surgeon: Gaylyn Gladis PENNER, MD;  Location[facility] ENDOSCOPY;  Service: Endoscopy;  Laterality: N/A;   ESOPHAGOGASTRODUODENOSCOPY (EGD) WITH PROPOFOL  N/A 05/01/2015   Procedure: ESOPHAGOGASTRODUODENOSCOPY (EGD) WITH PROPOFOL ;  Surgeon: Deward CINDERELLA Piedmont, MD;  Location: [facility] ENDOSCOPY;  Service: Gastroenterology;  Laterality: N/A;   ESOPHAGOGASTRODUODENOSCOPY (EGD) WITH PROPOFOL  N/A 02/12/2019   Procedure: ESOPHAGOGASTRODUODENOSCOPY (EGD) WITH PROPOFOL ;  Surgeon: Gaylyn Gladis PENNER, MD;  Location: [facility] ENDOSCOPY;  Service: Endoscopy;  Laterality: N/A;   EYE SURGERY  08/2012   laser surgery on retina/ DR Appenzeller   FINGER SURGERY     repair of tendon in right index, Dr. Nancyann in [facility] SURGERY     Dr. Verta   IR IMAGING GUIDED PORT INSERTION  05/31/2024   IRRIGATION AND DEBRIDEMENT SEBACEOUS CYST     right upper back   LAPAROSCOPIC SUPRACERVICAL HYSTERECTOMY  06/2007   Dr Kincius/ adhesions/CPP/adenomyosis   MASTECTOMY PARTIAL / LUMPECTOMY Right 01/2008   with sentinal lymph node biopsy/ infiltrating ductal cancer   REPAIR PERONEAL TENDONS ANKLE  10/2019   with tarsal exostectomy and sural neuroma excision on right    SKIN CANCER EXCISION     back and calves   WISDOM TOOTH EXTRACTION  1991    Social History   Socioeconomic History   Marital status: Married    Spouse name: Marcey   Number of children: 1   Years of education: 14   Highest education level: Not on file  Occupational History   Occupation: CMA  Tobacco Use   Smoking status: Never   Smokeless tobacco: Never  Vaping Use   Vaping status: Never Used  Substance and Sexual Activity   Alcohol use: Yes     Alcohol/week: 0.0 standard drinks of alcohol    Comment: rare, 1-2 drinks per year   Drug use: No   Sexual activity: Yes    Partners: Male    Birth control/protection: Surgical    Comment: Hysterectomy   Other Topics Concern   Not on file  Social History Narrative   Lives in [place] with husband, no pets.  Works at [employer].      Diet - regular   Exercise - occasional  Social Drivers of Corporate Investment Banker Strain: Low Risk  (01/16/2024)   Received from [facility]   Overall Financial Resource Strain (CARDIA)    Difficulty of Paying Living Expenses: Not hard at all  Food Insecurity: No Food Insecurity (01/16/2024)   Received from [facility]   Hunger Vital Sign    Within the past 12 months, you worried that your food would run out before you got the money to buy more.: Never true    Within the past 12 months, the food you bought just didn't last and you didn't have money to get more.: Never true  Transportation Needs: No Transportation Needs (01/16/2024)   Received from [facility] - Transportation    In the past 12 months, has lack of transportation kept you from medical appointments or from getting medications?: No    Lack of Transportation (Non-Medical): No  Physical Activity: Insufficiently Active (07/27/2017)   Exercise Vital Sign    Days of Exercise per Week: 4 days    Minutes of Exercise per Session: 30 min  Stress: No Stress Concern Present (07/27/2017)   [employer] of Occupational Health - Occupational Stress Questionnaire    Feeling of Stress : Only a little  Social Connections: Socially Integrated (07/27/2017)   Social Connection and Isolation Panel    Frequency of Communication with Friends and Family: More than three times a week    Frequency of Social Gatherings with Friends and Family: Once a week    Attends Religious Services: More than 4 times per year    Active Member of [employer] or  Organizations: Yes    Attends Banker Meetings: More than 4 times per year    Marital Status: Married  Catering Manager Violence: Not At Risk (07/27/2017)   Humiliation, Afraid, Rape, and Kick questionnaire    Fear of Current or Ex-Partner: No    Emotionally Abused: No    Physically Abused: No    Sexually Abused: No    Family History  Problem Relation Age of Onset   Hypertension Mother    Thyroid  disease Mother    Breast cancer Mother 71   Colon cancer Mother 61       again at 81   Atrial fibrillation Mother    Hip fracture Mother    Skin cancer Mother    Stroke Father    Hypertension Father    Cancer Father 46       prostate   Parkinson's disease Father    Skin cancer Father    Atrial fibrillation Sister        Pacemaker inserted end of Sept-beginning of Oct. 2024   Hypertension Sister    Thyroid  disease Sister    Cancer Sister        skin/ squamous cell   Heart Problems Sister    Diabetes Maternal Aunt    Cancer Maternal Aunt    Breast cancer Maternal Aunt 77   Lymphoma Maternal Uncle    Heart disease Maternal Uncle    Melanoma Maternal Uncle 35   Lung cancer Paternal Aunt 97       smoker   Diabetes Maternal Grandmother    Stroke Maternal Grandfather    Heart disease Paternal Grandfather    Thyroid  cancer Cousin        maternal   Breast cancer Cousin 49       maternal   Breast cancer Cousin 6  Colon cancer Cousin      Current Outpatient Medications:    aspirin EC 81 MG tablet, Take by mouth., Disp: , Rfl:    b complex vitamins capsule, Take 1 capsule by mouth daily., Disp: , Rfl:    Blood Glucose Monitoring Suppl (BLOOD GLUCOSE MONITOR SYSTEM) w/Device KIT, Use to test blood sugars in the morning, at noon, and at bedtime., Disp: 1 kit, Rfl: 0   buPROPion  (WELLBUTRIN  XL) 150 MG 24 hr tablet, Take 1 tablet (150 mg total) by mouth daily. Take along a 300mg  tablet for a total daily dose of 450mg ., Disp: 90 tablet, Rfl: 3   buPROPion  (WELLBUTRIN   XL) 300 MG 24 hr tablet, Take 1 tablet (300 mg total) by mouth daily., Disp: 90 tablet, Rfl: 3   busPIRone  (BUSPAR ) 10 MG tablet, Take 1 tablet (10 mg total) by mouth 2 (two) times daily, Disp: 180 tablet, Rfl: 1   calcium carbonate (OSCAL) 1500 (600 Ca) MG TABS tablet, Take by mouth 2 (two) times daily with a meal., Disp: , Rfl:    capivasertib  (TRUQAP ) 160 MG pack, Take 2 tablets (320 mg total) by mouth 2 (two) times daily. Take for 4 days, then hold for 3 days. Repeat every 7 days., Disp: 64 tablet, Rfl: 0   Cholecalciferol (VITAMIN D3) 10 MCG (400 UNIT) CAPS, Take by mouth., Disp: , Rfl:    citalopram  (CELEXA ) 40 MG tablet, Take 1 tablet (40 mg total) by mouth daily., Disp: 90 tablet, Rfl: 3   clotrimazole  (LOTRIMIN ) 1 % cream, Apply topically 2 (two) times daily., Disp: , Rfl:    diphenhydrAMINE  (BENADRYL ) 25 MG tablet, Take 25 mg by mouth at bedtime as needed for sleep. Pt states daily now for sleep and allergies., Disp: , Rfl:    diphenoxylate -atropine  (LOMOTIL ) 2.5-0.025 MG tablet, Take 2 tablets by mouth 4 (four) times daily as needed for diarrhea or loose stools., Disp: 60 tablet, Rfl: 0   gabapentin  (NEURONTIN ) 100 MG capsule, Take 1 capsule (100 mg total)  By mouth every morning , THEN 2 capsules (200 mg total) every evening., Disp: 270 capsule, Rfl: 0   ketotifen  (ZADITOR ) 0.025 % ophthalmic solution, Place 1 drop into both eyes daily. (Patient taking differently: Place 1 drop into both eyes as needed.), Disp: , Rfl:    lidocaine -prilocaine  (EMLA ) cream, Apply to affected area once as directed., Disp: 30 g, Rfl: 3   Magnesium Glycinate 100 MG CAPS, , Disp: , Rfl:    metFORMIN  (GLUCOPHAGE -XR) 500 MG 24 hr tablet, Take 1 tablet (500 mg total) by mouth 2 (two) times daily with a meal., Disp: 180 tablet, Rfl: 1   Multiple Vitamin (MULTIVITAMIN) tablet, Take 1 tablet by mouth daily., Disp: , Rfl:    nitrofurantoin , macrocrystal-monohydrate, (MACROBID ) 100 MG capsule, Take 1 capsule (100 mg  total) by mouth 2 (two) times daily. (Patient taking differently: Take 100 mg by mouth as needed.), Disp: 14 capsule, Rfl: 0   omeprazole  (PRILOSEC) 20 MG capsule, Take 1 capsule (20 mg total) by mouth daily., Disp: 90 capsule, Rfl: 3   ondansetron  (ZOFRAN ) 4 MG tablet, Take 1 tablet (4 mg total) by mouth every 8 (eight) hours as needed for nausea or vomiting., Disp: 90 tablet, Rfl: 0   ondansetron  (ZOFRAN ) 8 MG tablet, Take 1 tablet (8 mg total) by mouth every 8 (eight) hours as needed for nausea or vomiting. Start on the third day after chemotherapy., Disp: 30 tablet, Rfl: 1   HYDROmorphone  (DILAUDID ) 2 MG tablet, Take  2 tablets (4 mg total) by mouth every 6 (six) hours as needed for severe pain (pain score 7-10)., Disp: 120 tablet, Rfl: 0   morphine  (MS CONTIN ) 30 MG 12 hr tablet, Take 1 tablet (30 mg total) by mouth every 8 (eight) hours., Disp: 90 tablet, Rfl: 0 No current facility-administered medications for this visit.  Facility-Administered Medications Ordered in Other Visits:    acetaminophen  (TYLENOL ) tablet 650 mg, 650 mg, Oral, Once, Mikaya Bunner C, MD   dexamethasone (DECADRON) injection 10 mg, 10 mg, Intravenous, Once, Jacqueline Annah BROCKS, MD   dextrose  5 % solution, , Intravenous, Continuous, Jacqueline Annah BROCKS, MD, Last Rate: 10 mL/hr at 06/11/24 1016, New Bag at 06/11/24 1016   diphenhydrAMINE  (BENADRYL ) tablet 50 mg, 50 mg, Oral, Once, Jacqueline Annah BROCKS, MD   fam-trastuzumab deruxtecan-nxki (ENHERTU) 500 mg in dextrose  5 % 100 mL chemo infusion, 5.4 mg/kg (Treatment Plan Recorded), Intravenous, Once, Jacqueline Annah BROCKS, MD   fosaprepitant (EMEND) 150 mg in sodium chloride  0.9 % 145 mL IVPB, 150 mg, Intravenous, Once, Jacqueline Annah BROCKS, MD   palonosetron (ALOXI) injection 0.25 mg, 0.25 mg, Intravenous, Once, Jacqueline Annah BROCKS, MD  Physical exam:  Vitals:   06/11/24 0910  BP: 120/70  Pulse: 89  Resp: 20  Temp: 98 F (36.7 C)  SpO2: 100%  Weight: 200 lb 8 oz (90.9 kg)   Physical  Exam Constitutional:      Comments: Sitting in a wheelchair.  Appears in no acute distress  Cardiovascular:     Rate and Rhythm: Normal rate and regular rhythm.     Heart sounds: Normal heart sounds.  Pulmonary:     Effort: Pulmonary effort is normal.     Breath sounds: Normal breath sounds.  Skin:    General: Skin is warm and dry.  Neurological:     Mental Status: She is alert and oriented to person, place, and time.      I have personally reviewed labs listed below:    Latest Ref Rng & Units 06/11/2024    8:34 AM  CMP  Glucose 70 - 99 mg/dL 889   BUN 8 - 23 mg/dL 12   Creatinine 9.55 - 1.00 mg/dL 9.22   Sodium 864 - 854 mmol/L 139   Potassium 3.5 - 5.1 mmol/L 4.2   Chloride 98 - 111 mmol/L 101   CO2 22 - 32 mmol/L 26   Calcium 8.9 - 10.3 mg/dL 9.4   Total Protein 6.5 - 8.1 g/dL 7.1   Total Bilirubin 0.0 - 1.2 mg/dL 0.5   Alkaline Phos 38 - 126 U/L 200   AST 15 - 41 U/L 55   ALT 0 - 44 U/L 36       Latest Ref Rng & Units 06/11/2024    8:34 AM  CBC  WBC 4.0 - 10.5 K/uL 9.5   Hemoglobin 12.0 - 15.0 g/dL 9.7   Hematocrit 63.9 - 46.0 % 30.6   Platelets 150 - 400 K/uL 285    I have personally reviewed Radiology images listed below: No images are attached to the encounter.  ECHOCARDIOGRAM COMPLETE Result Date: 06/06/2024    ECHOCARDIOGRAM REPORT   Patient Name:   Maryl DONNALYN JURAN Date of Exam: 06/06/2024 Medical Rec #:  [id number]      Height:       63.0 in Accession #:    [id number]     Weight:       208.0 lb Date of Birth:  [birth year]  BSA:          1.966 m Patient Age:    63 years       BP:           122/81 mmHg Patient Gender: F              HR:           86 bpm. Exam Location:  [facility] Procedure: 2D Echo, Cardiac Doppler, Color Doppler and Strain Analysis (Both            Spectral and Color Flow Doppler were utilized during procedure). Indications:     Chemo Z09  History:         Patient has prior history of Echocardiogram examinations, most                  recent  02/08/2023. Signs/Symptoms:Murmur.  Sonographer:     Christopher Furnace Referring Phys:  [id number] ANNAH Deleon Joandy Burget Diagnosing Phys: Evalene Lunger MD  Sonographer Comments: Global longitudinal strain was attempted. IMPRESSIONS  1. Left ventricular ejection fraction, by estimation, is 60 to 65%. The left ventricle has normal function. The left ventricle has no regional wall motion abnormalities. Left ventricular diastolic parameters are consistent with Grade I diastolic dysfunction (impaired relaxation). The average left ventricular global longitudinal strain is -18.0 %. The global longitudinal strain is normal.  2. Right ventricular systolic function is normal. The right ventricular size is normal.  3. The mitral valve is normal in structure. No evidence of mitral valve regurgitation. No evidence of mitral stenosis.  4. The aortic valve is normal in structure. Aortic valve regurgitation is not visualized. No aortic stenosis is present.  5. The inferior vena cava is normal in size with greater than 50% respiratory variability, suggesting right atrial pressure of 3 mmHg. FINDINGS  Left Ventricle: Left ventricular ejection fraction, by estimation, is 60 to 65%. The left ventricle has normal function. The left ventricle has no regional wall motion abnormalities. The average left ventricular global longitudinal strain is -18.0 %. Strain was performed and the global longitudinal strain is normal. The left ventricular internal cavity size was normal in size. There is no left ventricular hypertrophy. Left ventricular diastolic parameters are consistent with Grade I diastolic dysfunction (impaired relaxation). Right Ventricle: The right ventricular size is normal. No increase in right ventricular wall thickness. Right ventricular systolic function is normal. Left Atrium: Left atrial size was normal in size. Right Atrium: Right atrial size was normal in size. Pericardium: There is no evidence of pericardial effusion. Mitral Valve: The  mitral valve is normal in structure. No evidence of mitral valve regurgitation. No evidence of mitral valve stenosis. MV peak gradient, 4.2 mmHg. The mean mitral valve gradient is 2.0 mmHg. Tricuspid Valve: The tricuspid valve is normal in structure. Tricuspid valve regurgitation is not demonstrated. No evidence of tricuspid stenosis. Aortic Valve: The aortic valve is normal in structure. Aortic valve regurgitation is not visualized. No aortic stenosis is present. Aortic valve mean gradient measures 6.0 mmHg. Aortic valve peak gradient measures 9.7 mmHg. Aortic valve area, by VTI measures 2.48 cm. Pulmonic Valve: The pulmonic valve was normal in structure. Pulmonic valve regurgitation is not visualized. No evidence of pulmonic stenosis. Aorta: The aortic root is normal in size and structure. Venous: The inferior vena cava is normal in size with greater than 50% respiratory variability, suggesting right atrial pressure of 3 mmHg. IAS/Shunts: No atrial level shunt detected by color flow Doppler. Additional Comments: 3D was performed not  requiring image post processing on an independent workstation and was indeterminate.  LEFT VENTRICLE PLAX 2D LVIDd:         3.40 cm   Diastology LVIDs:         2.40 cm   LV e' medial:    7.18 cm/s LV PW:         1.10 cm   LV E/e' medial:  10.1 LV IVS:        1.30 cm   LV e' lateral:   8.38 cm/s LVOT diam:     2.00 cm   LV E/e' lateral: 8.6 LV SV:         68 LV SV Index:   35        2D Longitudinal Strain LVOT Area:     3.14 cm  2D Strain GLS Avg:     -18.0 % LV IVRT:       108 msec  RIGHT VENTRICLE RV Basal diam:  2.90 cm     PULMONARY VEINS RV Mid diam:    2.00 cm     Diastolic Velocity: 42.60 cm/s RV S prime:     16.00 cm/s  S/D Velocity:       0.60 TAPSE (M-mode): 2.8 cm      Systolic Velocity:  23.70 cm/s LEFT ATRIUM             Index        RIGHT ATRIUM          Index LA diam:        2.20 cm 1.12 cm/m   RA Area:     8.42 cm LA Vol (A2C):   26.0 ml 13.22 ml/m  RA Volume:    13.70 ml 6.97 ml/m LA Vol (A4C):   19.2 ml 9.77 ml/m LA Biplane Vol: 22.3 ml 11.34 ml/m  AORTIC VALVE AV Area (Vmax):    2.76 cm AV Area (Vmean):   3.03 cm AV Area (VTI):     2.48 cm AV Vmax:           156.00 cm/s AV Vmean:          114.000 cm/s AV VTI:            0.275 m AV Peak Grad:      9.7 mmHg AV Mean Grad:      6.0 mmHg LVOT Vmax:         137.00 cm/s LVOT Vmean:        110.000 cm/s LVOT VTI:          0.217 m LVOT/AV VTI ratio: 0.79  AORTA Ao Root diam: 2.70 cm MITRAL VALVE                TRICUSPID VALVE MV Area (PHT): 4.10 cm     TR Peak grad:   9.2 mmHg MV Area VTI:   3.36 cm     TR Vmax:        152.00 cm/s MV Peak grad:  4.2 mmHg MV Mean grad:  2.0 mmHg     SHUNTS MV Vmax:       1.02 m/s     Systemic VTI:  0.22 m MV Vmean:      69.6 cm/s    Systemic Diam: 2.00 cm MV Decel Time: 185 msec MV E velocity: 72.30 cm/s MV A velocity: 104.00 cm/s MV E/A ratio:  0.70 Evalene Lunger MD Electronically signed by Evalene Lunger MD Signature Date/Time: 06/06/2024/2:02:23 PM    Final  IR IMAGING GUIDED PORT INSERTION Result Date: 05/31/2024 CLINICAL DATA:  Right-sided breast cancer. EXAM: Chest port catheter placement TECHNIQUE: Procedure performed using fluoroscopy and ultrasound CONTRAST:  None RADIOPHARMACEUTICALS:  None FLUOROSCOPY: 8 mGy COMPARISON:  None FINDINGS: The patient was placed in supine position on the IR gantry and the left upper chest and neck were prepped and draped in the usual sterile fashion. The nurse administered intravenous fentanyl  and Versed  under my supervision and the nurse had no other injuries other than monitoring the patient and administering medications. I was present for the entire duration of procedure. 2 mg intravenous Versed  and 100 mcg intravenous fentanyl  were administered for a total sedation time of 17 minutes. Ultrasound guidance was used to investigate the left internal jugular vein which was anechoic and compressible indicating patency. The needle was then  advanced from a scan negative through the soft tissue into the left internal jugular vein under ultrasound guidance. A final image was obtained and stored in the patient's permanent medical record. Access was then exchanged over a guidewire which was advanced under fluoroscopic guidance. The needle was removed and replaced with a micropuncture sheath. Approximately 2 inches below the clavicle the port pocket was created with a subsequent incision. The catheter was then tunneled from the port pocket to the venotomy site overlying the right internal jugular vein. Access was then exchanged over an 035 guidewire for peel-away sheath which was advanced over the guidewire under fluoroscopic guidance. The catheter was then advanced through the peel-away sheath to the sinoatrial junction. Sheath was removed. The catheter was then cut at the port pocket and connected to chest port. The chest port was tested for function and finally function well. The chest port was then flushed with heparin  and a port pocket was closed with 4-0 suture. Final image was obtained demonstrating satisfactory position of chest port. The final count of all materials was satisfactory. IMPRESSION: 1. Satisfactory placement of left internal jugular vein single-lumen chest port. Tip of the catheter is at the caval atrial junction. 2.  Okay to use and power inject chest port. Electronically Signed   By: Cordella Banner   On: 05/31/2024 13:34   NM Bone Scan Whole Body Result Date: 05/16/2024 EXAM: NM WHOLE BODY BONE SCAN 05/14/2024 02:33:00 PM TECHNIQUE: Anterior and posterior whole body images were obtained at least 3 hours following the intravenous administration of radiopharmaceutical. RADIOPHARMACEUTICAL: 21.12 mCi Tc-43m MDP COMPARISON: 02/08/2024 CLINICAL HISTORY: breast cancer mets to bone FINDINGS: BONES: Similar appearance of widespread metastatic disease to bone including the ribs, spine, sacrum, right acetabulum, bilateral humeral shafts,  intertrochanteric region of the right femur, left femoral shaft, and possibly the calvarium. Degenerative findings in the shoulders, knees, and ankle regions. SOFT TISSUES: Physiologic activity within the kidneys and urinary bladder. IMPRESSION: 1. Similar appearance on distribution of widespread metastatic disease including involvement of the ribs, spine, sacrum, right acetabulum, bilateral humeral shafts, intertrochanteric region of the right femur, left femoral shaft, and possibly the calvarium. 2. Degenerative changes in the shoulders, knees, and ankles. Electronically signed by: Ryan Salvage MD 05/16/2024 09:30 AM EST RP Workstation: [id number]   MR LIVER W WO CONTRAST Result Date: 05/16/2024 EXAM: MRCP WITH AND WITHOUT IV CONTRAST 05/15/2024 01:48:05 PM TECHNIQUE: Multisequence, multiplanar magnetic resonance images of the abdomen with and without intravenous contrast. MRCP sequences were performed. 10 mL gadobutrol  (GADAVIST ) 1 MMOL/ML injection was administered. COMPARISON: 03/02/2024 CLINICAL HISTORY: liver metastases; breast cancer FINDINGS: LIVER: Numerous scattered small metastatic lesions throughout all lobes of the  liver appear mildly increased in size and number compared to 03/02/2024. For example, on image 70 of series 11, foci of abnormal diffusion weighted imaging posteriorly in the right hepatic lobe are visibly increased compared to 03/02/2024. Some individual lesions are relatively similar including the 1.1 cm lesion posteriorly in the right hepatic lobe on image 66 series 11. Individual lesions measure up to about 1.3 cm in diameter with most of the lesions subcentimeter in size. These lesions have faintly accentuated T2 signal and the larger lesions demonstrate moderate arterial phase enhancement. The widespread distribution of the lesions and conspicuity of the smaller lesions is highest on the diffusion weighted images. GALLBLADDER AND BILIARY SYSTEM: Gallbladder is absent. No  intrahepatic or extrahepatic ductal dilation. SPLEEN: Unremarkable. PANCREAS/PANCREATIC DUCT: Visualized pancreas is unremarkable. No pancreatic ductal dilatation. ADRENAL GLANDS: Unremarkable. 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 LYMPH NODES: No enlarged abdominal lymph nodes. VASCULATURE: Unremarkable. PERITONEUM: No ascites. ABDOMINAL WALL: No hernia. No mass. BOWEL: Prominence of stool throughout the colon, favoring constipation. No bowel obstruction. BONES: Widespread diffuse osseous metastatic disease. 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 SOFT TISSUES: Unremarkable. MISCELLANEOUS: Unremarkable. IMPRESSION: 1. Numerous hepatic metastases mildly increased in size and number compared to 03/02/24. 2. Widespread diffuse osseous metastatic disease. 3. 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 4. No biliary dilatation. Gallbladder absent. 5. Prominence of stool throughout the colon, favoring constipation. Electronically signed by: Ryan Salvage MD 05/16/2024 09:18 AM EST RP Workstation: [id number]   CT Chest Wo Contrast Result Date: 05/16/2024 EXAM: CT CHEST WITHOUT CONTRAST 05/15/2024 01:40:56 PM TECHNIQUE: CT of the chest was performed without the administration of intravenous contrast. Multiplanar reformatted images are provided for review. Automated exposure control, iterative reconstruction, and/or weight based adjustment of the mA/kV was utilized to reduce the radiation dose to as low as reasonably achievable. COMPARISON: Multiple exams including PET CT 03/19/2024. CLINICAL HISTORY: Breast cancer metastatic to bone restaging assessment. * Tracking Code: BO * FINDINGS: MEDIASTINUM: Heart and pericardium are unremarkable. The central airways are clear. LYMPH NODES: Node 0.9 cm in short axis on image 67 series 2, within normal limits. Several tiny  perifissural nodules are likely lymph nodes. No axillary lymphadenopathy. LUNGS AND PLEURA: 6 x 4 mm anterior right middle lobe nodule on image 78 series 4. Previous patchy consolidation in the left lower lobe has resolved. No pulmonary edema. No pleural effusion or pneumothorax. SOFT TISSUES/BONES: Roughly stable appearance of widespread scattered lytic and sclerotic metastatic lesions in the thoracic spine, ribs, bilateral scapulae, bilateral clavicle, and sternum compatible with diffuse/widespread osseous metastatic disease. 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 Previously seen liver lesions are not well characterized on today's noncontrast exam. IMPRESSION: 1. Stable widespread osseous metastatic disease involving the thoracic spine, ribs, bilateral scapulae, bilateral clavicle, and sternum. 2. 6 x 4 mm anterior right middle lobe pulmonary nodule; surveillance in the context of the patient anticipated follow-up oncology imaging suggestive. 3. Resolved patchy consolidation in the left lower lobe. Electronically signed by: Ryan Salvage MD 05/16/2024 09:02 AM EST RP Workstation: [id number]     Assessment and plan- Patient is a 63 y.o. 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 HER2 testing showed conflicting results; pathologist reported no HER2 expression, but Tempest testing showed HER2 plus two.  I am therefore considering a trial of Enhertu before proceeding with systemic chemotherapy options.  Patient is already exhausted endocrine therapy options.  Monitoring tumor markers every three weeks to assess treatment efficacy. Heart function  stable with ejection fraction of 60-65%. - Administered Enhertu today. - Monitor tumor markers every three weeks. - Schedule echocardiogram every three months.  Cancer-related pain Advised to take Dilaudid  as needed for breakthrough pain without restriction due to morphine  use. - Refilled morphine  and Dilaudid  prescriptions. - Advised to take Dilaudid  as needed for breakthrough pain.  Anemia under evaluation for iron deficiency Hemoglobin 9.7, smaller blood cells suggest possible iron deficiency. Iron levels need checking before considering iron infusions. - Checked iron levels. - Will arrange iron infusions if iron deficiency is confirmed.  Depression on pharmacotherapy Increased emotional distress. Currently on Buspar , Wellbutrin , and Celexa , managed by primary care. Encouraged to engage in activities that bring joy and maintain social connections. - Continue current medications for depression.  Drug-induced twitching Twitching likely related to pain medications, not indicative of seizures. Discussed potential use of Ativan or lorazepam but decided against due to potential sedation and medication burden. - Monitor twitching and report any changes.  Sweating, possibly cancer-related Experiencing excessive sweating, possibly related to cancer or medication side effects. - Continue to monitor symptoms.         Visit Diagnosis 1. Primary malignant neoplasm of breast with metastasis (HCC)   2. Encounter for monoclonal antibody treatment for malignancy      Dr. Annah Skene, MD, MPH [facility] [phone] 06/11/2024 10:22 AM

## 2024-06-11 NOTE — Progress Notes (Signed)
 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 MD notified per pharmacy & MD will change to anti-emetic instead

## 2024-06-12 ENCOUNTER — Other Ambulatory Visit: Payer: Self-pay | Admitting: Medical Oncology

## 2024-06-12 ENCOUNTER — Other Ambulatory Visit: Payer: Self-pay

## 2024-06-13 ENCOUNTER — Other Ambulatory Visit: Payer: Self-pay

## 2024-06-13 NOTE — Progress Notes (Signed)
 Therapy changed due to progression. Dis-enrolling.

## 2024-06-18 ENCOUNTER — Other Ambulatory Visit: Payer: Self-pay

## 2024-06-18 ENCOUNTER — Encounter: Payer: Self-pay | Admitting: Oncology

## 2024-06-18 MED ORDER — HYDROMORPHONE HCL 2 MG PO TABS
4.0000 mg | ORAL_TABLET | Freq: Four times a day (QID) | ORAL | 0 refills | Status: DC | PRN
  Filled 2024-06-18: qty 120, 15d supply, fill #0

## 2024-06-21 NOTE — Addendum Note (Signed)
 Encounter addended by: Janice Lynwood BROCKS on: 06/21/2024 11:57 AM  Actions taken: Imaging Exam ended

## 2024-06-22 ENCOUNTER — Other Ambulatory Visit: Payer: Self-pay

## 2024-06-22 ENCOUNTER — Inpatient Hospital Stay

## 2024-06-22 ENCOUNTER — Inpatient Hospital Stay: Admitting: Oncology

## 2024-06-24 ENCOUNTER — Other Ambulatory Visit: Payer: Self-pay

## 2024-07-02 ENCOUNTER — Inpatient Hospital Stay

## 2024-07-02 ENCOUNTER — Other Ambulatory Visit: Payer: Self-pay

## 2024-07-02 ENCOUNTER — Encounter: Payer: Self-pay | Admitting: Oncology

## 2024-07-02 ENCOUNTER — Inpatient Hospital Stay (HOSPITAL_BASED_OUTPATIENT_CLINIC_OR_DEPARTMENT_OTHER): Admitting: Oncology

## 2024-07-02 VITALS — BP 111/65 | HR 89 | Temp 97.7°F | Resp 19 | Ht 63.0 in | Wt 202.1 lb

## 2024-07-02 VITALS — BP 105/56

## 2024-07-02 DIAGNOSIS — C50919 Malignant neoplasm of unspecified site of unspecified female breast: Secondary | ICD-10-CM | POA: Diagnosis not present

## 2024-07-02 DIAGNOSIS — T451X5A Adverse effect of antineoplastic and immunosuppressive drugs, initial encounter: Secondary | ICD-10-CM

## 2024-07-02 DIAGNOSIS — Z5112 Encounter for antineoplastic immunotherapy: Secondary | ICD-10-CM

## 2024-07-02 DIAGNOSIS — D701 Agranulocytosis secondary to cancer chemotherapy: Secondary | ICD-10-CM

## 2024-07-02 LAB — CMP (CANCER CENTER ONLY)
ALT: 23 U/L (ref 0–44)
AST: 28 U/L (ref 15–41)
Albumin: 3.4 g/dL — ABNORMAL LOW (ref 3.5–5.0)
Alkaline Phosphatase: 219 U/L — ABNORMAL HIGH (ref 38–126)
Anion gap: 10 (ref 5–15)
BUN: 12 mg/dL (ref 8–23)
CO2: 24 mmol/L (ref 22–32)
Calcium: 8.5 mg/dL — ABNORMAL LOW (ref 8.9–10.3)
Chloride: 106 mmol/L (ref 98–111)
Creatinine: 0.99 mg/dL (ref 0.44–1.00)
GFR, Estimated: >60 mL/min
Glucose, Bld: 99 mg/dL (ref 70–99)
Potassium: 4.5 mmol/L (ref 3.5–5.1)
Sodium: 140 mmol/L (ref 135–145)
Total Bilirubin: 0.2 mg/dL (ref 0.0–1.2)
Total Protein: 5.7 g/dL — ABNORMAL LOW (ref 6.5–8.1)

## 2024-07-02 LAB — CBC WITH DIFFERENTIAL (CANCER CENTER ONLY)
Abs Immature Granulocytes: 0.07 K/uL (ref 0.00–0.07)
Basophils Absolute: 0 K/uL (ref 0.0–0.1)
Basophils Relative: 1 %
Eosinophils Absolute: 0.2 K/uL (ref 0.0–0.5)
Eosinophils Relative: 5 %
HCT: 31.2 % — ABNORMAL LOW (ref 36.0–46.0)
Hemoglobin: 9.4 g/dL — ABNORMAL LOW (ref 12.0–15.0)
Immature Granulocytes: 2 %
Lymphocytes Relative: 32 %
Lymphs Abs: 1 K/uL (ref 0.7–4.0)
MCH: 25.5 pg — ABNORMAL LOW (ref 26.0–34.0)
MCHC: 30.1 g/dL (ref 30.0–36.0)
MCV: 84.8 fL (ref 80.0–100.0)
Monocytes Absolute: 0.4 K/uL (ref 0.1–1.0)
Monocytes Relative: 13 %
Neutro Abs: 1.5 K/uL — ABNORMAL LOW (ref 1.7–7.7)
Neutrophils Relative %: 47 %
Platelet Count: 271 K/uL (ref 150–400)
RBC: 3.68 MIL/uL — ABNORMAL LOW (ref 3.87–5.11)
RDW: 22.8 % — ABNORMAL HIGH (ref 11.5–15.5)
WBC Count: 3.1 K/uL — ABNORMAL LOW (ref 4.0–10.5)
nRBC: 0.6 % — ABNORMAL HIGH (ref 0.0–0.2)

## 2024-07-02 MED ORDER — DEXTROSE 5 % IV SOLN
INTRAVENOUS | Status: DC
  Filled 2024-07-02: qty 250

## 2024-07-02 MED ORDER — FAM-TRASTUZUMAB DERUXTECAN-NXKI CHEMO 100 MG IV SOLR
5.4000 mg/kg | Freq: Once | INTRAVENOUS | Status: AC
  Administered 2024-07-02: 500 mg via INTRAVENOUS
  Filled 2024-07-02: qty 25

## 2024-07-02 MED ORDER — PALONOSETRON HCL INJECTION 0.25 MG/5ML
0.2500 mg | Freq: Once | INTRAVENOUS | Status: AC
  Administered 2024-07-02: 0.25 mg via INTRAVENOUS
  Filled 2024-07-02: qty 5

## 2024-07-02 MED ORDER — ZOLEDRONIC ACID 4 MG/5ML IV CONC
3.0000 mg | INTRAVENOUS | Status: DC
  Administered 2024-07-02: 3 mg via INTRAVENOUS
  Filled 2024-07-02: qty 3.75

## 2024-07-02 MED ORDER — PROCHLORPERAZINE EDISYLATE 10 MG/2ML IJ SOLN
10.0000 mg | Freq: Once | INTRAMUSCULAR | Status: AC
  Administered 2024-07-02: 10 mg via INTRAVENOUS
  Filled 2024-07-02: qty 2

## 2024-07-02 MED ORDER — ACETAMINOPHEN 325 MG PO TABS
650.0000 mg | ORAL_TABLET | Freq: Once | ORAL | Status: AC
  Administered 2024-07-02: 650 mg via ORAL
  Filled 2024-07-02: qty 2

## 2024-07-02 MED ORDER — SODIUM CHLORIDE 0.9 % IV SOLN
150.0000 mg | Freq: Once | INTRAVENOUS | Status: AC
  Administered 2024-07-02: 150 mg via INTRAVENOUS
  Filled 2024-07-02: qty 150

## 2024-07-02 MED ORDER — DIPHENHYDRAMINE HCL 25 MG PO TABS
50.0000 mg | ORAL_TABLET | Freq: Once | ORAL | Status: AC
  Administered 2024-07-02: 50 mg via ORAL
  Filled 2024-07-02: qty 2

## 2024-07-02 NOTE — Progress Notes (Signed)
 Patient states her left ankle has a mild throb pain & that its is starting to hurt to walk on it. This has been going on for a couple of months; now worsening.

## 2024-07-02 NOTE — Patient Instructions (Signed)
 [facility] - A DEPT OF [facility]  Discharge Instructions: Thank you for choosing [facility] to provide your oncology and hematology care.  If you have a lab appointment with the Cancer Center, please go directly to the Cancer Center and check in at the registration area.  Wear comfortable clothing and clothing appropriate for easy access to any Portacath or PICC line.   We strive to give you quality time with your provider. You may need to reschedule your appointment if you arrive late (15 or more minutes).  Arriving late affects you and other patients whose appointments are after yours.  Also, if you miss three or more appointments without notifying the office, you may be dismissed from the clinic at the providers discretion.      For prescription refill requests, have your pharmacy contact our office and allow 72 hours for refills to be completed.    Today you received the following chemotherapy and/or immunotherapy agents ENHURTU and ZOMETA       To help prevent nausea and vomiting after your treatment, we encourage you to take your nausea medication as directed.  BELOW ARE SYMPTOMS THAT SHOULD BE REPORTED IMMEDIATELY: *FEVER GREATER THAN 100.4 F (38 C) OR HIGHER *CHILLS OR SWEATING *NAUSEA AND VOMITING THAT IS NOT CONTROLLED WITH YOUR NAUSEA MEDICATION *UNUSUAL SHORTNESS OF BREATH *UNUSUAL BRUISING OR BLEEDING *URINARY PROBLEMS (pain or burning when urinating, or frequent urination) *BOWEL PROBLEMS (unusual diarrhea, constipation, pain near the anus) TENDERNESS IN MOUTH AND THROAT WITH OR WITHOUT PRESENCE OF ULCERS (sore throat, sores in mouth, or a toothache) UNUSUAL RASH, SWELLING OR PAIN  UNUSUAL VAGINAL DISCHARGE OR ITCHING   Items with * indicate a potential emergency and should be followed up as soon as possible or go to the Emergency Department if any problems should occur.  Please show the CHEMOTHERAPY ALERT CARD or  IMMUNOTHERAPY ALERT CARD at check-in to the Emergency Department and triage nurse.  Should you have questions after your visit or need to cancel or reschedule your appointment, please contact [facility] - A DEPT OF [facility]  [phone] and follow the prompts.  Office hours are 8:00 a.m. to 4:30 p.m. Monday - Friday. Please note that voicemails left after 4:00 p.m. may not be returned until the following business day.  We are closed weekends and major holidays. You have access to a nurse at all times for urgent questions. Please call the main number to the clinic [phone] and follow the prompts.  For any non-urgent questions, you may also contact your provider using MyChart. We now offer e-Visits for anyone 26 and older to request care online for non-urgent symptoms. For details visit [email or web address].   Also download the MyChart app! Go to the app store, search MyChart, open the app, select [facility], and log in with your MyChart username and password.  Fam-Trastuzumab  Deruxtecan Injection What is this medication? FAM-TRASTUZUMAB  DERUXTECAN (fam-tras TOOZ eu mab DER ux TEE kan) treats some types of cancer. It works by blocking a protein that causes cancer cells to grow and multiply. This helps to slow or stop the spread of cancer cells. This medicine may be used for other purposes; ask your health care provider or pharmacist if you have questions. COMMON BRAND NAME(S): ENHERTU  What should I tell my care team before I take this medication? They need to know if you have any of these conditions: Heart disease Heart  failure Infection, especially a viral infection, such as chickenpox, cold sores, or herpes Liver disease Lung or breathing disease, such as asthma or COPD An unusual or allergic reaction to fam-trastuzumab  deruxtecan, other medications, foods, dyes, or preservatives Pregnant or trying to get pregnant Breast-feeding How should I use  this medication? This medication is injected into a vein. It is given by your care team in a hospital or clinic setting. A special MedGuide will be given to you before each treatment. Be sure to read this information carefully each time. Talk to your care team about the use of this medication in children. Special care may be needed. Overdosage: If you think you have taken too much of this medicine contact a poison control center or emergency room at once. NOTE: This medicine is only for you. Do not share this medicine with others. What if I miss a dose? It is important not to miss your dose. Call your care team if you are unable to keep an appointment. What may interact with this medication? Interactions are not expected. This list may not describe all possible interactions. Give your health care provider a list of all the medicines, herbs, non-prescription drugs, or dietary supplements you use. Also tell them if you smoke, drink alcohol, or use illegal drugs. Some items may interact with your medicine. What should I watch for while using this medication? Visit your care team for regular checks on your progress. Tell your care team if your symptoms do not start to get better or if they get worse. This medication may increase your risk of getting an infection. Call your care team for advice if you get a fever, chills, sore throat, or other symptoms of a cold or flu. Do not treat yourself. Try to avoid being around people who are sick. Avoid taking medications that contain aspirin, acetaminophen , ibuprofen , naproxen, or ketoprofen unless instructed by your care team. These medications may hide a fever. Be careful brushing or flossing your teeth or using a toothpick because you may get an infection or bleed more easily. If you have any dental work done, tell your dentist you are receiving this medication. This medication may cause dry eyes and blurred vision. If you wear contact lenses, you may feel  some discomfort. Lubricating eye drops may help. See your care team if the problem does not go away or is severe. Talk to your care team if you may be pregnant. Serious birth defects can occur if you take this medication during pregnancy and for 7 months after the last dose. If your partner can get pregnant, use a condom during sex while taking this medication and for 4 months after the last dose. Do not breastfeed while taking this medication and for 7 months after the last dose. This medication may cause infertility. Talk to your care team if you are concerned about your fertility. What side effects may I notice from receiving this medication? Side effects that you should report to your care team as soon as possible: Allergic reactions--skin rash, itching, hives, swelling of the face, lips, tongue, or throat Dry cough, shortness of breath or trouble breathing Infection--fever, chills, cough, sore throat, wounds that don't heal, pain or trouble when passing urine, general feeling of discomfort or being unwell Heart failure--shortness of breath, swelling of the ankles, feet, or hands, sudden weight gain, unusual weakness or fatigue Unusual bruising or bleeding Side effects that usually do not require medical attention (report these to your care team if they continue  or are bothersome): Constipation Diarrhea Hair loss Muscle pain Nausea Vomiting This list may not describe all possible side effects. Call your doctor for medical advice about side effects. You may report side effects to FDA at 1-800-FDA-1088. Where should I keep my medication? This medication is given in a hospital or clinic. It will not be stored at home. NOTE: This sheet is a summary. It may not cover all possible information. If you have questions about this medicine, talk to your doctor, pharmacist, or health care provider.  2024 Elsevier/Gold Standard (2023-02-25 00:00:00) . Zoledronic  Acid Injection (Cancer) What is this  medication? ZOLEDRONIC  ACID (ZOE le dron ik AS id) treats high calcium  levels in the blood caused by cancer. It may also be used with chemotherapy to treat weakened bones caused by cancer. It works by slowing down the release of calcium  from bones. This lowers calcium  levels in your blood. It also makes your bones stronger and less likely to break (fracture). It belongs to a group of medications called bisphosphonates. This medicine may be used for other purposes; ask your health care provider or pharmacist if you have questions. COMMON BRAND NAME(S): Zometa , Zometa  Powder What should I tell my care team before I take this medication? They need to know if you have any of these conditions: Dehydration Dental disease Kidney disease Liver disease Low levels of calcium  in the blood Lung or breathing disease, such as asthma Receiving steroids, such as dexamethasone  or prednisone An unusual or allergic reaction to zoledronic  acid, other medications, foods, dyes, or preservatives Pregnant or trying to get pregnant Breast-feeding How should I use this medication? This medication is injected into a vein. It is given by your care team in a hospital or clinic setting. Talk to your care team about the use of this medication in children. Special care may be needed. Overdosage: If you think you have taken too much of this medicine contact a poison control center or emergency room at once. NOTE: This medicine is only for you. Do not share this medicine with others. What if I miss a dose? Keep appointments for follow-up doses. It is important not to miss your dose. Call your care team if you are unable to keep an appointment. What may interact with this medication? Certain antibiotics given by injection Diuretics, such as bumetanide, furosemide NSAIDs, medications for pain and inflammation, such as ibuprofen  or naproxen Teriparatide Thalidomide This list may not describe all possible interactions. Give  your health care provider a list of all the medicines, herbs, non-prescription drugs, or dietary supplements you use. Also tell them if you smoke, drink alcohol, or use illegal drugs. Some items may interact with your medicine. What should I watch for while using this medication? Visit your care team for regular checks on your progress. It may be some time before you see the benefit from this medication. Some people who take this medication have severe bone, joint, or muscle pain. This medication may also increase your risk for jaw problems or a broken thigh bone. Tell your care team right away if you have severe pain in your jaw, bones, joints, or muscles. Tell you care team if you have any pain that does not go away or that gets worse. Tell your dentist and dental surgeon that you are taking this medication. You should not have major dental surgery while on this medication. See your dentist to have a dental exam and fix any dental problems before starting this medication. Take good care of  your teeth while on this medication. Make sure you see your dentist for regular follow-up appointments. You should make sure you get enough calcium  and vitamin D  while you are taking this medication. Discuss the foods you eat and the vitamins you take with your care team. Check with your care team if you have severe diarrhea, nausea, and vomiting, or if you sweat a lot. The loss of too much body fluid may make it dangerous for you to take this medication. You may need bloodwork while taking this medication. Talk to your care team if you wish to become pregnant or think you might be pregnant. This medication can cause serious birth defects. What side effects may I notice from receiving this medication? Side effects that you should report to your care team as soon as possible: Allergic reactions--skin rash, itching, hives, swelling of the face, lips, tongue, or throat Kidney injury--decrease in the amount of urine,  swelling of the ankles, hands, or feet Low calcium  level--muscle pain or cramps, confusion, tingling, or numbness in the hands or feet Osteonecrosis of the jaw--pain, swelling, or redness in the mouth, numbness of the jaw, poor healing after dental work, unusual discharge from the mouth, visible bones in the mouth Severe bone, joint, or muscle pain Side effects that usually do not require medical attention (report to your care team if they continue or are bothersome): Constipation Fatigue Fever Loss of appetite Nausea Stomach pain This list may not describe all possible side effects. Call your doctor for medical advice about side effects. You may report side effects to FDA at 1-800-FDA-1088. Where should I keep my medication? This medication is given in a hospital or clinic. It will not be stored at home. NOTE: This sheet is a summary. It may not cover all possible information. If you have questions about this medicine, talk to your doctor, pharmacist, or health care provider.  2024 Elsevier/Gold Standard (2021-08-21 00:00:00)

## 2024-07-02 NOTE — Progress Notes (Signed)
 "    Hematology/Oncology Consult note [facility]  [phone] [phone]  Patient Care Team: Diedra Lame, MD as PCP - General (Family Medicine) Melanee Annah BROCKS, MD as Consulting Physician (Oncology)   Name of the patient: Jacqueline Deleon  [id number]  1961/01/09   Date of visit: 07/02/2024  Diagnosis-metastatic ER positive breast cancer with liver and bone metastases  Chief complaint/ Reason for visit-on treatment assessment prior to cycle 2 of Enhertu   Heme/Onc history: Patient is a 63 year old female with a past medical history significant for stage I right breast cancer diagnosed in 2009.  It was a 1.4 cm tumor with negative lymph nodes grade 1 and patient went on to get lumpectomy followed by adjuvant radiation therapy.  Oncotype DX score was 6 and she did not require adjuvant chemotherapy.  She was on tamoxifen for 5 years.  She had BRCA testing done at that time which was negative.   Patient felt a lump in her right axilla in September 2021.  She underwent ultrasound of bilateral breasts which did not pick up any axillary mass.  A prior screening mammogram in September 2021 was unremarkable.  She was then admitted for Covid pneumonia and underwent CT angio chest on 07/22/2020 which incidentally picked up an axillary mass measuring 2.5 cm.  No obvious breast mass.  Axillary mass was biopsied and was consistent with high-grade invasive mammary carcinoma.  IHC was positive for GATA3 with diffuse strong nuclear staining.  There was no lymph node architecture identified in the sample and it could not be determined if it is a primary tumor within the axillary breast tissue or metastases.  ER strongly positive greater than 90%, PR 1 to 10% positive and HER-2 negative- 0   PET CT scan showed hypermetabolic poorly marginated solid right axillary mass 2.5 x 2 cm with an SUV of 5.6.  No enlarged hypermetabolic mediastinal or hilar lymph nodes no enlarged left  axillary lymph nodes.  Subcentimeter lung nodules below PET resolution.  Small hypermetabolism in the right liver with an SUV of 6 without CT correlate equivocal for liver metastases.  Multiple hypermetabolic faintly lytic osseous lesions throughout the lumbar spine and bilateral pelvic girdle with representative supra-acetabular right iliac bone 1.9 cm lesion, anterior superior left acetabular 1.7 cm lesion and L1 1.2 cm lesion.   NGS testing showed no actionable mutations.  PD-L1 less than 1%.  CHEK2 S422 FS, PIK3CA N1068 FS, PIK3CA N345H, CCN E1 gain, ERB B2 1 P-CSF 1R fusion, FGF 19 gain, FGF 3 gain, FGF 4gain.   Ibrance  plus letrozole  started in February 2022.    Patient noted to have increase in CA 27-29 from 26 and November 2023 203 in July 2024.  CT chest abdomen pelvis with contrast and bone scan showed worsening metastatic disease in the bones especially in the thoracolumbar spine and right hemipelvis.     Peripheral blood NGS testing showed CHEK2 mutation, PIK3CA P.N1068 FS, PIK3CA P.N345H, PTEN P.R173S, ESR 1P.D538G, ESR 1P.L536R, PIK3CA P.R88Q.  MSI high not detected  Elacestrant  started in sept 2024.  Disease progression noted in June 2025 and given the presence of PIK3CA mutation patient switched capivasertib  plus fulvestrant    Disease progression noted in November 2025 based on increase in the size of liver lesions.  Repeat liver biopsy confirmed ER 30%, PR negative breast cancer.  Her tumor was not reported to be -0 by our pathology but was noted to be +2 on [employer] testing.  Plan is therefore to  proceed with Enhertu  first before considering systemic chemotherapy       Interval history- History of Present Illness   Jacqueline Deleon is a 63 year old female with metastatic breast cancer who presents for follow-up after her second cycle of systemic therapy, complicated by chemotherapy-induced neutropenia and chronic cancer pain.  She completed her second cycle of systemic therapy for  metastatic breast cancer. Following her most recent treatment, she developed flu-like symptoms on day 2 or 3, including myalgias and malaise, which persisted through the first week. During the second week, myalgias resolved but she continued to experience general malaise, three episodes of emesis, and decreased appetite. Over the past week, her symptoms have improved and she has resumed normal oral intake. She did not receive Zometa  with her last treatment. Compared to her previous regimen with capivasertib  (Truqap ), she notes improvement in diarrhea with the current therapy.  She is taking Dilaudid  and morphine  regularly, which provide effective analgesia for her chronic cancer pain. She describes her quality of life as good when pain is controlled. Bowel movements are regular and she denies constipation.  Her white blood cell count is 3.1 and her absolute neutrophil count is 1.5 following treatment. She did not receive G-CSF after her last cycle.  She continues to experience chronic left ankle pain, stiffness, and swelling, which has recently worsened. She reports increased discomfort and fatigue with ambulation and is considering a passive exercise device to improve circulation.       ECOG PS- 1 Pain scale- 4 Opioid associated constipation- no  Review of systems- Review of Systems  Constitutional:  Positive for malaise/fatigue. Negative for chills, fever and weight loss.  HENT:  Negative for congestion, ear discharge and nosebleeds.   Eyes:  Negative for blurred vision.  Respiratory:  Negative for cough, hemoptysis, sputum production, shortness of breath and wheezing.   Cardiovascular:  Negative for chest pain, palpitations, orthopnea and claudication.  Gastrointestinal:  Negative for abdominal pain, blood in stool, constipation, diarrhea, heartburn, melena, nausea and vomiting.  Genitourinary:  Negative for dysuria, flank pain, frequency, hematuria and urgency.  Musculoskeletal:  Positive  for back pain and joint pain (Right ankle pain). Negative for myalgias.  Skin:  Negative for rash.  Neurological:  Negative for dizziness, tingling, focal weakness, seizures, weakness and headaches.  Endo/Heme/Allergies:  Does not bruise/bleed easily.  Psychiatric/Behavioral:  Negative for depression and suicidal ideas. The patient does not have insomnia.       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   Chicken pox    Depression    Fatty liver    Fatty liver    Gastric polyps    GERD (gastroesophageal reflux disease)    Gestational diabetes    prediabetes out side of having baby   Headache    migraines   Heart murmur    History of abnormal mammogram 08/2006, 01/2008, 03/2009   Hypertension    IBS (irritable bowel syndrome)    Joint disorder    hx of left ankle tendonitis, bursitis, heel spur   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 Smith/ laprascopic surgery    COLONOSCOPY  04/04/2000   COLONOSCOPY WITH PROPOFOL  N/A 02/12/2019   Procedure: COLONOSCOPY WITH PROPOFOL ;  Surgeon: Gaylyn Gladis PENNER, MD;  Location: [facility] ENDOSCOPY;  Service: Endoscopy;  Laterality: N/A;   ESOPHAGOGASTRODUODENOSCOPY (EGD) WITH PROPOFOL  N/A 05/01/2015   Procedure: ESOPHAGOGASTRODUODENOSCOPY (EGD) WITH PROPOFOL ;  Surgeon: Deward CINDERELLA Piedmont, MD;  Location: [facility] ENDOSCOPY;  Service: Gastroenterology;  Laterality: N/A;   ESOPHAGOGASTRODUODENOSCOPY (EGD) WITH PROPOFOL  N/A 02/12/2019   Procedure: ESOPHAGOGASTRODUODENOSCOPY (EGD) WITH PROPOFOL ;  Surgeon: Gaylyn Gladis PENNER, MD;  Location: [facility] ENDOSCOPY;  Service: Endoscopy;  Laterality: N/A;   EYE SURGERY  08/2012   laser surgery on retina/ DR Appenzeller   FINGER SURGERY     repair of tendon in right index, Dr. Nancyann in [facility]     Dr. Verta   IR IMAGING GUIDED PORT INSERTION  05/31/2024   IRRIGATION AND DEBRIDEMENT SEBACEOUS CYST     right upper back   LAPAROSCOPIC SUPRACERVICAL HYSTERECTOMY  06/2007   Dr Kincius/ adhesions/CPP/adenomyosis   MASTECTOMY PARTIAL / LUMPECTOMY Right 01/2008   with sentinal lymph node biopsy/ infiltrating ductal cancer   REPAIR PERONEAL TENDONS ANKLE  10/2019   with tarsal exostectomy and sural neuroma excision on right    SKIN CANCER EXCISION     back and calves   WISDOM TOOTH EXTRACTION  1991    Social History   Socioeconomic History   Marital status: Married    Spouse name: Marcey   Number of children: 1   Years of education: 14   Highest education level: Not on file  Occupational History   Occupation: CMA  Tobacco Use   Smoking status: Never   Smokeless tobacco: Never  Vaping Use   Vaping status: Never Used  Substance and Sexual Activity   Alcohol use: Yes    Alcohol/week: 0.0 standard drinks of alcohol    Comment: rare, 1-2 drinks per year   Drug use: No   Sexual activity: Yes    Partners: Male    Birth control/protection: Surgical    Comment: Hysterectomy    Other Topics Concern   Not on file  Social History Narrative   Lives in [place] with husband, no pets.  Works at [employer].      Diet - regular   Exercise - occasional   Social Drivers of Health   Tobacco Use: Low Risk (07/02/2024)   Patient History    Smoking Tobacco Use: Never    Smokeless Tobacco Use: Never    Passive Exposure: Not on file  Financial Resource Strain: Low Risk  (01/16/2024)   Received from [facility]   Overall Financial Resource Strain (CARDIA)  Difficulty of Paying Living Expenses: Not hard at all  Food Insecurity: No Food Insecurity (01/16/2024)   Received from [facility]   Epic    Within the past 12 months, you worried that your food would run out before you got the money to buy more.: Never true    Within the past 12 months, the food you bought just didn't last and you didn't have money to get more.: Never true  Transportation Needs: No Transportation Needs (01/16/2024)   Received [facility] - Transportation    In the past 12 months, has lack of transportation kept you from medical appointments or from getting medications?: No    Lack of Transportation (Non-Medical): No  Physical Activity: Not on file  Stress: Not on file  Social Connections: Not on file  Intimate Partner Violence: Not on file  Depression (PHQ2-9): Low Risk (07/02/2024)   Depression (PHQ2-9)    PHQ-2 Score: 0  Alcohol Screen: Not on file  Housing: High Risk (01/16/2024)   Received from [facility]   Epic    In the last 12 months, was there a time when you were not able to pay the mortgage or rent on time?: No    In the past 12 months, how many times have you moved where you were living?: 0    At any time in the past 12 months, were you homeless or living in a shelter (including now)?: Yes  Utilities: Not At Risk (01/16/2024)   Received from [facility]   Epic    In the past 12  months has the electric, gas, oil, or water company threatened to shut off services in your home?: No  Health Literacy: Not on file    Family History  Problem Relation Age of Onset   Hypertension Mother    Thyroid  disease Mother    Breast cancer Mother 50   Colon cancer Mother 12       again at 24   Atrial fibrillation Mother    Hip fracture Mother    Skin cancer Mother    Stroke Father    Hypertension Father    Cancer Father 20       prostate   Parkinson's disease Father    Skin cancer Father    Atrial fibrillation Sister        Pacemaker inserted end of Sept-beginning of Oct. 2024   Hypertension Sister    Thyroid  disease Sister    Cancer Sister        skin/ squamous cell   Heart Problems Sister    Diabetes Maternal Aunt    Cancer Maternal Aunt    Breast cancer Maternal Aunt 67   Lymphoma Maternal Uncle    Heart disease Maternal Uncle    Melanoma Maternal Uncle 41   Lung cancer Paternal Aunt 57       smoker   Diabetes Maternal Grandmother    Stroke Maternal Grandfather    Heart disease Paternal Grandfather    Thyroid  cancer Cousin        maternal   Breast cancer Cousin 34       maternal   Breast cancer Cousin 37   Colon cancer Cousin     Current Medications[2]  Physical exam:  Vitals:   07/02/24 1319  BP: 111/65  Pulse: 89  Resp: 19  Temp: 97.7 F (36.5 C)  TempSrc: Tympanic  SpO2: 99%  Weight: 202 lb  1.6 oz (91.7 kg)  Height: 5' 3 (1.6 m)   Physical Exam Cardiovascular:     Rate and Rhythm: Normal rate and regular rhythm.     Heart sounds: Normal heart sounds.  Pulmonary:     Effort: Pulmonary effort is normal.     Breath sounds: Normal breath sounds.  Skin:    General: Skin is warm and dry.  Neurological:     Mental Status: She is alert and oriented to person, place, and time.      I have personally reviewed labs listed below:    Latest Ref Rng & Units 07/02/2024   12:39 PM  CMP  Glucose 70 - 99 mg/dL 99   BUN 8 - 23 mg/dL 12    Creatinine 9.55 - 1.00 mg/dL 9.00   Sodium 864 - 854 mmol/L 140   Potassium 3.5 - 5.1 mmol/L 4.5   Chloride 98 - 111 mmol/L 106   CO2 22 - 32 mmol/L 24   Calcium  8.9 - 10.3 mg/dL 8.5   Total Protein 6.5 - 8.1 g/dL 5.7   Total Bilirubin 0.0 - 1.2 mg/dL 0.2   Alkaline Phos 38 - 126 U/L 219   AST 15 - 41 U/L 28   ALT 0 - 44 U/L 23       Latest Ref Rng & Units 07/02/2024   12:39 PM  CBC  WBC 4.0 - 10.5 K/uL 3.1   Hemoglobin 12.0 - 15.0 g/dL 9.4   Hematocrit 63.9 - 46.0 % 31.2   Platelets 150 - 400 K/uL 271    I have personally reviewed Radiology images listed below: No images are attached to the encounter.  ECHOCARDIOGRAM COMPLETE Result Date: 06/06/2024    ECHOCARDIOGRAM REPORT   Patient Name:   Ionia ALIEAH BRINTON Date of Exam: 06/06/2024 Medical Rec #:  [id number]      Height:       63.0 in Accession #:    [id number]     Weight:       208.0 lb Date of Birth:  [birth year]      BSA:          1.966 m Patient Age:    63 years       BP:           122/81 mmHg Patient Gender: F              HR:           86 bpm. Exam Location:  [facility] Procedure: 2D Echo, Cardiac Doppler, Color Doppler and Strain Analysis (Both            Spectral and Color Flow Doppler were utilized during procedure). Indications:     Chemo Z09  History:         Patient has prior history of Echocardiogram examinations, most                  recent 02/08/2023. Signs/Symptoms:Murmur.  Sonographer:     Christopher Furnace Referring Phys:  [id number] ANNAH BROCKS Brock Mokry Diagnosing Phys: Evalene Lunger MD  Sonographer Comments: Global longitudinal strain was attempted. IMPRESSIONS  1. Left ventricular ejection fraction, by estimation, is 60 to 65%. The left ventricle has normal function. The left ventricle has no regional wall motion abnormalities. Left ventricular diastolic parameters are consistent with Grade I diastolic dysfunction (impaired relaxation). The average left ventricular global longitudinal strain is -18.0 %. The global longitudinal strain is  normal.  2. Right ventricular systolic function is normal. The  right ventricular size is normal.  3. The mitral valve is normal in structure. No evidence of mitral valve regurgitation. No evidence of mitral stenosis.  4. The aortic valve is normal in structure. Aortic valve regurgitation is not visualized. No aortic stenosis is present.  5. The inferior vena cava is normal in size with greater than 50% respiratory variability, suggesting right atrial pressure of 3 mmHg. FINDINGS  Left Ventricle: Left ventricular ejection fraction, by estimation, is 60 to 65%. The left ventricle has normal function. The left ventricle has no regional wall motion abnormalities. The average left ventricular global longitudinal strain is -18.0 %. Strain was performed and the global longitudinal strain is normal. The left ventricular internal cavity size was normal in size. There is no left ventricular hypertrophy. Left ventricular diastolic parameters are consistent with Grade I diastolic dysfunction (impaired relaxation). Right Ventricle: The right ventricular size is normal. No increase in right ventricular wall thickness. Right ventricular systolic function is normal. Left Atrium: Left atrial size was normal in size. Right Atrium: Right atrial size was normal in size. Pericardium: There is no evidence of pericardial effusion. Mitral Valve: The mitral valve is normal in structure. No evidence of mitral valve regurgitation. No evidence of mitral valve stenosis. MV peak gradient, 4.2 mmHg. The mean mitral valve gradient is 2.0 mmHg. Tricuspid Valve: The tricuspid valve is normal in structure. Tricuspid valve regurgitation is not demonstrated. No evidence of tricuspid stenosis. Aortic Valve: The aortic valve is normal in structure. Aortic valve regurgitation is not visualized. No aortic stenosis is present. Aortic valve mean gradient measures 6.0 mmHg. Aortic valve peak gradient measures 9.7 mmHg. Aortic valve area, by VTI measures 2.48  cm. Pulmonic Valve: The pulmonic valve was normal in structure. Pulmonic valve regurgitation is not visualized. No evidence of pulmonic stenosis. Aorta: The aortic root is normal in size and structure. Venous: The inferior vena cava is normal in size with greater than 50% respiratory variability, suggesting right atrial pressure of 3 mmHg. IAS/Shunts: No atrial level shunt detected by color flow Doppler. Additional Comments: 3D was performed not requiring image post processing on an independent workstation and was indeterminate.  LEFT VENTRICLE PLAX 2D LVIDd:         3.40 cm   Diastology LVIDs:         2.40 cm   LV e' medial:    7.18 cm/s LV PW:         1.10 cm   LV E/e' medial:  10.1 LV IVS:        1.30 cm   LV e' lateral:   8.38 cm/s LVOT diam:     2.00 cm   LV E/e' lateral: 8.6 LV SV:         68 LV SV Index:   35        2D Longitudinal Strain LVOT Area:     3.14 cm  2D Strain GLS Avg:     -18.0 % LV IVRT:       108 msec  RIGHT VENTRICLE RV Basal diam:  2.90 cm     PULMONARY VEINS RV Mid diam:    2.00 cm     Diastolic Velocity: 42.60 cm/s RV S prime:     16.00 cm/s  S/D Velocity:       0.60 TAPSE (M-mode): 2.8 cm      Systolic Velocity:  23.70 cm/s LEFT ATRIUM             Index  RIGHT ATRIUM          Index LA diam:        2.20 cm 1.12 cm/m   RA Area:     8.42 cm LA Vol (A2C):   26.0 ml 13.22 ml/m  RA Volume:   13.70 ml 6.97 ml/m LA Vol (A4C):   19.2 ml 9.77 ml/m LA Biplane Vol: 22.3 ml 11.34 ml/m  AORTIC VALVE AV Area (Vmax):    2.76 cm AV Area (Vmean):   3.03 cm AV Area (VTI):     2.48 cm AV Vmax:           156.00 cm/s AV Vmean:          114.000 cm/s AV VTI:            0.275 m AV Peak Grad:      9.7 mmHg AV Mean Grad:      6.0 mmHg LVOT Vmax:         137.00 cm/s LVOT Vmean:        110.000 cm/s LVOT VTI:          0.217 m LVOT/AV VTI ratio: 0.79  AORTA Ao Root diam: 2.70 cm MITRAL VALVE                TRICUSPID VALVE MV Area (PHT): 4.10 cm     TR Peak grad:   9.2 mmHg MV Area VTI:   3.36 cm      TR Vmax:        152.00 cm/s MV Peak grad:  4.2 mmHg MV Mean grad:  2.0 mmHg     SHUNTS MV Vmax:       1.02 m/s     Systemic VTI:  0.22 m MV Vmean:      69.6 cm/s    Systemic Diam: 2.00 cm MV Decel Time: 185 msec MV E velocity: 72.30 cm/s MV A velocity: 104.00 cm/s MV E/A ratio:  0.70 Evalene Lunger MD Electronically signed by Evalene Lunger MD Signature Date/Time: 06/06/2024/2:02:23 PM    Final      Assessment and plan- Patient is a 63 y.o. female with history of metastatic ER positive breast cancer PR negative HER2 equivocal on IHC here for on treatment assessment prior to cycle 2 of Enhertu   Assessment and Plan    Metastatic breast cancer White cell count is 3.1 today with an ANC of 1.5.  We will see if patient can get the Udenyca with Enhertu .  Stable hepatic and renal function. Tumor markers and cardiac function monitored regularly. - Monitor tumor markers and base clinical decisions on trends from at least two sets of results. - Schedule next echocardiogram for end of February. - Plan next imaging scan to coincide with the echocardiogram.  Chemotherapy-induced neutropenia Neutropenia post-chemotherapy with WBC 3.1 and ANC 1.5. G-CSF administration considered pending insurance approval. - Obtain insurance approval for G-CSF. - If approved, administer G-CSF 24 hours post-treatment (either tomorrow or December 24). - Monitor WBC and ANC closely.  Chronic cancer pain Pain well controlled with Dilaudid  and morphine . Good quality of life with adequate analgesia and no overt constipation. - Continue current analgesic regimen with Dilaudid  and morphine .  Left ankle pain and swelling Chronic left ankle pain, stiffness, and swelling worsening. Considering passive exercise device. Further evaluation by [facility] planned. - Instructed to contact [facility] for further evaluation. - Advised to trial a passive exercise device for mobility and circulation. - Offered assistance with referral to  [facility] if  unable to schedule independently.         Visit Diagnosis 1. Primary malignant neoplasm of breast with metastasis (HCC)   2. Encounter for monoclonal antibody treatment for malignancy   3. Chemotherapy induced neutropenia      Dr. Annah Skene, MD, MPH [facility] [phone] 07/02/2024 1:20 PM                   [1]  Allergies Allergen Reactions   Kiwi Extract Itching and Swelling    The real kiwi fruit   Codeine Other (See Comments)    Felt funny.   Food Itching and Swelling    grapes   Amoxicillin Rash   Erythromycin Rash   Sulfa  Antibiotics Rash   Tape Rash    Adhesives  Pt can have paper tape  [2]  Current Outpatient Medications:    aspirin EC 81 MG tablet, Take by mouth., Disp: , Rfl:    b complex vitamins capsule, Take 1 capsule by mouth daily., Disp: , Rfl:    Blood Glucose Monitoring Suppl (BLOOD GLUCOSE MONITOR SYSTEM) w/Device KIT, Use to test blood sugars in the morning, at noon, and at bedtime., Disp: 1 kit, Rfl: 0   buPROPion  (WELLBUTRIN  XL) 150 MG 24 hr tablet, Take 1 tablet (150 mg total) by mouth daily. Take along a 300mg  tablet for a total daily dose of 450mg ., Disp: 90 tablet, Rfl: 3   buPROPion  (WELLBUTRIN  XL) 300 MG 24 hr tablet, Take 1 tablet (300 mg total) by mouth daily., Disp: 90 tablet, Rfl: 3   busPIRone  (BUSPAR ) 10 MG tablet, Take 1 tablet (10 mg total) by mouth 2 (two) times daily, Disp: 180 tablet, Rfl: 1   calcium  carbonate (OSCAL) 1500 (600 Ca) MG TABS tablet, Take by mouth 2 (two) times daily with a meal., Disp: , Rfl:    capivasertib  (TRUQAP ) 160 MG pack, Take 2 tablets (320 mg total) by mouth 2 (two) times daily. Take for 4 days, then hold for 3 days. Repeat every 7 days., Disp: 64 tablet, Rfl: 0   Cholecalciferol  (VITAMIN D3) 10 MCG (400 UNIT) CAPS, Take by mouth., Disp: , Rfl:    citalopram  (CELEXA ) 40 MG tablet, Take 1 tablet (40 mg total) by mouth daily., Disp: 90 tablet, Rfl: 3    clotrimazole  (LOTRIMIN ) 1 % cream, Apply topically 2 (two) times daily., Disp: , Rfl:    diphenhydrAMINE  (BENADRYL ) 25 MG tablet, Take 25 mg by mouth at bedtime as needed for sleep. Pt states daily now for sleep and allergies., Disp: , Rfl:    diphenoxylate -atropine  (LOMOTIL ) 2.5-0.025 MG tablet, Take 2 tablets by mouth 4 (four) times daily as needed for diarrhea or loose stools., Disp: 60 tablet, Rfl: 0   gabapentin  (NEURONTIN ) 100 MG capsule, Take 1 capsule (100 mg total)  By mouth every morning , THEN 2 capsules (200 mg total) every evening., Disp: 270 capsule, Rfl: 0   HYDROmorphone  (DILAUDID ) 2 MG tablet, Take 2 tablets (4 mg total) by mouth every 6 (six) hours as needed for severe pain (pain score 7-10)., Disp: 120 tablet, Rfl: 0   ketotifen  (ZADITOR ) 0.025 % ophthalmic solution, Place 1 drop into both eyes daily. (Patient taking differently: Place 1 drop into both eyes as needed.), Disp: , Rfl:    lidocaine -prilocaine  (EMLA ) cream, Apply to affected area once as directed., Disp: 30 g, Rfl: 3   Magnesium  Glycinate 100 MG CAPS, , Disp: , Rfl:    metFORMIN  (GLUCOPHAGE -XR) 500 MG  24 hr tablet, Take 1 tablet (500 mg total) by mouth 2 (two) times daily with a meal., Disp: 180 tablet, Rfl: 1   morphine  (MS CONTIN ) 30 MG 12 hr tablet, Take 1 tablet (30 mg total) by mouth every 8 (eight) hours., Disp: 90 tablet, Rfl: 0   Multiple Vitamin (MULTIVITAMIN) tablet, Take 1 tablet by mouth daily., Disp: , Rfl:    nitrofurantoin , macrocrystal-monohydrate, (MACROBID ) 100 MG capsule, Take 1 capsule (100 mg total) by mouth 2 (two) times daily. (Patient taking differently: Take 100 mg by mouth as needed.), Disp: 14 capsule, Rfl: 0   omeprazole  (PRILOSEC) 20 MG capsule, Take 1 capsule (20 mg total) by mouth daily., Disp: 90 capsule, Rfl: 3   ondansetron  (ZOFRAN ) 4 MG tablet, Take 1 tablet (4 mg total) by mouth every 8 (eight) hours as needed for nausea or vomiting., Disp: 90 tablet, Rfl: 0   ondansetron  (ZOFRAN ) 8 MG  tablet, Take 1 tablet (8 mg total) by mouth every 8 (eight) hours as needed for nausea or vomiting. Start on the third day after chemotherapy., Disp: 30 tablet, Rfl: 1  "

## 2024-07-03 ENCOUNTER — Encounter: Payer: Self-pay | Admitting: Oncology

## 2024-07-03 ENCOUNTER — Other Ambulatory Visit: Payer: Self-pay

## 2024-07-03 LAB — CANCER ANTIGEN 27.29: CA 27.29: 441.8 U/mL — ABNORMAL HIGH (ref 0.0–38.6)

## 2024-07-03 LAB — CANCER ANTIGEN 15-3: CA 15-3: 403 U/mL — ABNORMAL HIGH (ref 0.0–25.0)

## 2024-07-03 MED ORDER — HYDROMORPHONE HCL 2 MG PO TABS
4.0000 mg | ORAL_TABLET | Freq: Four times a day (QID) | ORAL | 0 refills | Status: DC | PRN
  Filled 2024-07-03: qty 120, 15d supply, fill #0

## 2024-07-04 ENCOUNTER — Telehealth: Payer: Self-pay

## 2024-07-04 ENCOUNTER — Other Ambulatory Visit: Payer: Self-pay

## 2024-07-04 ENCOUNTER — Observation Stay
Admission: EM | Admit: 2024-07-04 | Discharge: 2024-07-05 | Disposition: A | Discharge status: Home / Self Care | Attending: Internal Medicine | Admitting: Internal Medicine

## 2024-07-04 ENCOUNTER — Emergency Department

## 2024-07-04 ENCOUNTER — Inpatient Hospital Stay

## 2024-07-04 ENCOUNTER — Other Ambulatory Visit: Payer: Self-pay | Admitting: Oncology

## 2024-07-04 DIAGNOSIS — G4733 Obstructive sleep apnea (adult) (pediatric): Secondary | ICD-10-CM | POA: Insufficient documentation

## 2024-07-04 DIAGNOSIS — Z7984 Long term (current) use of oral hypoglycemic drugs: Secondary | ICD-10-CM | POA: Insufficient documentation

## 2024-07-04 DIAGNOSIS — Z6835 Body mass index (BMI) 35.0-35.9, adult: Secondary | ICD-10-CM | POA: Insufficient documentation

## 2024-07-04 DIAGNOSIS — K760 Fatty (change of) liver, not elsewhere classified: Secondary | ICD-10-CM | POA: Diagnosis not present

## 2024-07-04 DIAGNOSIS — C7951 Secondary malignant neoplasm of bone: Secondary | ICD-10-CM | POA: Diagnosis not present

## 2024-07-04 DIAGNOSIS — I10 Essential (primary) hypertension: Secondary | ICD-10-CM | POA: Diagnosis present

## 2024-07-04 DIAGNOSIS — I5032 Chronic diastolic (congestive) heart failure: Secondary | ICD-10-CM | POA: Diagnosis not present

## 2024-07-04 DIAGNOSIS — Z85828 Personal history of other malignant neoplasm of skin: Secondary | ICD-10-CM | POA: Diagnosis not present

## 2024-07-04 DIAGNOSIS — I959 Hypotension, unspecified: Principal | ICD-10-CM | POA: Insufficient documentation

## 2024-07-04 DIAGNOSIS — I110 Hypertensive heart disease with heart failure: Secondary | ICD-10-CM | POA: Diagnosis not present

## 2024-07-04 DIAGNOSIS — W19XXXA Unspecified fall, initial encounter: Secondary | ICD-10-CM | POA: Diagnosis not present

## 2024-07-04 DIAGNOSIS — Z7982 Long term (current) use of aspirin: Secondary | ICD-10-CM | POA: Diagnosis not present

## 2024-07-04 DIAGNOSIS — D702 Other drug-induced agranulocytosis: Secondary | ICD-10-CM | POA: Diagnosis not present

## 2024-07-04 DIAGNOSIS — E669 Obesity, unspecified: Secondary | ICD-10-CM | POA: Insufficient documentation

## 2024-07-04 DIAGNOSIS — S2232XA Fracture of one rib, left side, initial encounter for closed fracture: Secondary | ICD-10-CM | POA: Insufficient documentation

## 2024-07-04 DIAGNOSIS — D649 Anemia, unspecified: Secondary | ICD-10-CM | POA: Insufficient documentation

## 2024-07-04 DIAGNOSIS — C50919 Malignant neoplasm of unspecified site of unspecified female breast: Secondary | ICD-10-CM | POA: Diagnosis present

## 2024-07-04 DIAGNOSIS — E119 Type 2 diabetes mellitus without complications: Secondary | ICD-10-CM | POA: Diagnosis not present

## 2024-07-04 DIAGNOSIS — Z79899 Other long term (current) drug therapy: Secondary | ICD-10-CM | POA: Diagnosis not present

## 2024-07-04 DIAGNOSIS — D701 Agranulocytosis secondary to cancer chemotherapy: Secondary | ICD-10-CM | POA: Diagnosis present

## 2024-07-04 DIAGNOSIS — C50912 Malignant neoplasm of unspecified site of left female breast: Secondary | ICD-10-CM | POA: Insufficient documentation

## 2024-07-04 DIAGNOSIS — N644 Mastodynia: Secondary | ICD-10-CM | POA: Diagnosis present

## 2024-07-04 DIAGNOSIS — R0602 Shortness of breath: Principal | ICD-10-CM

## 2024-07-04 DIAGNOSIS — F418 Other specified anxiety disorders: Secondary | ICD-10-CM | POA: Insufficient documentation

## 2024-07-04 LAB — RESP PANEL BY RT-PCR (RSV, FLU A&B, COVID)  RVPGX2
Influenza A by PCR: NEGATIVE
Influenza B by PCR: NEGATIVE
Resp Syncytial Virus by PCR: NEGATIVE
SARS Coronavirus 2 by RT PCR: NEGATIVE

## 2024-07-04 LAB — COMPREHENSIVE METABOLIC PANEL WITH GFR
ALT: 20 U/L (ref 0–44)
AST: 27 U/L (ref 15–41)
Albumin: 3.1 g/dL — ABNORMAL LOW (ref 3.5–5.0)
Alkaline Phosphatase: 190 U/L — ABNORMAL HIGH (ref 38–126)
Anion gap: 11 (ref 5–15)
BUN: 14 mg/dL (ref 8–23)
CO2: 25 mmol/L (ref 22–32)
Calcium: 7.5 mg/dL — ABNORMAL LOW (ref 8.9–10.3)
Chloride: 106 mmol/L (ref 98–111)
Creatinine, Ser: 0.92 mg/dL (ref 0.44–1.00)
GFR, Estimated: >60 mL/min
Glucose, Bld: 111 mg/dL — ABNORMAL HIGH (ref 70–99)
Potassium: 4.8 mmol/L (ref 3.5–5.1)
Sodium: 142 mmol/L (ref 135–145)
Total Bilirubin: 0.2 mg/dL (ref 0.0–1.2)
Total Protein: 5.3 g/dL — ABNORMAL LOW (ref 6.5–8.1)

## 2024-07-04 LAB — CBC
HCT: 29.6 % — ABNORMAL LOW (ref 36.0–46.0)
Hemoglobin: 8.7 g/dL — ABNORMAL LOW (ref 12.0–15.0)
MCH: 25.7 pg — ABNORMAL LOW (ref 26.0–34.0)
MCHC: 29.4 g/dL — ABNORMAL LOW (ref 30.0–36.0)
MCV: 87.3 fL (ref 80.0–100.0)
Platelets: 223 K/uL (ref 150–400)
RBC: 3.39 MIL/uL — ABNORMAL LOW (ref 3.87–5.11)
RDW: 22.5 % — ABNORMAL HIGH (ref 11.5–15.5)
WBC: 2.5 K/uL — ABNORMAL LOW (ref 4.0–10.5)
nRBC: 0 % (ref 0.0–0.2)

## 2024-07-04 LAB — RETICULOCYTES
Immature Retic Fract: 12.3 % (ref 2.3–15.9)
RBC.: 3.25 MIL/uL — ABNORMAL LOW (ref 3.87–5.11)
Retic Count, Absolute: 74.4 K/uL (ref 19.0–186.0)
Retic Ct Pct: 2.3 % (ref 0.4–3.1)

## 2024-07-04 LAB — IRON AND TIBC
Iron: 76 ug/dL (ref 28–170)
Saturation Ratios: 29 % (ref 10.4–31.8)
TIBC: 265 ug/dL (ref 250–450)
UIBC: 189 ug/dL

## 2024-07-04 LAB — APTT: aPTT: 30 s (ref 24–36)

## 2024-07-04 LAB — PROTIME-INR
INR: 1 (ref 0.8–1.2)
Prothrombin Time: 14 s (ref 11.4–15.2)

## 2024-07-04 LAB — PRO BRAIN NATRIURETIC PEPTIDE: Pro Brain Natriuretic Peptide: 83 pg/mL

## 2024-07-04 LAB — TYPE AND SCREEN
ABO/RH(D): O POS
Antibody Screen: NEGATIVE

## 2024-07-04 LAB — GLUCOSE, CAPILLARY: Glucose-Capillary: 114 mg/dL — ABNORMAL HIGH (ref 70–99)

## 2024-07-04 LAB — AMMONIA: Ammonia: 37 umol/L — ABNORMAL HIGH (ref 9–35)

## 2024-07-04 LAB — LIPASE, BLOOD: Lipase: <10 U/L — ABNORMAL LOW (ref 11–51)

## 2024-07-04 LAB — TROPONIN T, HIGH SENSITIVITY
Troponin T High Sensitivity: <15 ng/L (ref 0–19)
Troponin T High Sensitivity: <15 ng/L (ref 0–19)

## 2024-07-04 LAB — FERRITIN: Ferritin: 271 ng/mL (ref 11–307)

## 2024-07-04 MED ORDER — LIDOCAINE 5 % EX PTCH
1.0000 | MEDICATED_PATCH | CUTANEOUS | Status: DC
  Administered 2024-07-04: 1 via TRANSDERMAL
  Filled 2024-07-04 (×2): qty 1

## 2024-07-04 MED ORDER — FILGRASTIM 300 MCG/ML IJ SOLN: 5.0000 ug/kg | Freq: Once | INTRAVENOUS | Status: DC

## 2024-07-04 MED ORDER — BUSPIRONE HCL 5 MG PO TABS
10.0000 mg | ORAL_TABLET | Freq: Two times a day (BID) | ORAL | Status: DC
  Administered 2024-07-04 – 2024-07-05 (×2): 10 mg via ORAL
  Filled 2024-07-04 (×3): qty 2

## 2024-07-04 MED ORDER — SODIUM CHLORIDE 0.9 % IV BOLUS
500.0000 mL | Freq: Once | INTRAVENOUS | Status: AC
  Administered 2024-07-04: 500 mL via INTRAVENOUS

## 2024-07-04 MED ORDER — DIPHENHYDRAMINE HCL 25 MG PO CAPS: 25.0000 mg | ORAL_CAPSULE | Freq: Every evening | ORAL | Status: DC | PRN

## 2024-07-04 MED ORDER — IOHEXOL 350 MG/ML SOLN
100.0000 mL | Freq: Once | INTRAVENOUS | Status: AC | PRN
  Administered 2024-07-04: 100 mL via INTRAVENOUS

## 2024-07-04 MED ORDER — INSULIN ASPART 100 UNIT/ML IJ SOLN: 0.0000 [IU] | Freq: Every day | INTRAMUSCULAR | Status: DC

## 2024-07-04 MED ORDER — MIDODRINE HCL 5 MG PO TABS
10.0000 mg | ORAL_TABLET | Freq: Three times a day (TID) | ORAL | Status: DC
  Administered 2024-07-04 – 2024-07-05 (×3): 10 mg via ORAL
  Filled 2024-07-04 (×3): qty 2

## 2024-07-04 MED ORDER — B COMPLEX-C PO TABS
1.0000 | ORAL_TABLET | Freq: Every day | ORAL | Status: DC
  Administered 2024-07-05: 1 via ORAL
  Filled 2024-07-04: qty 1

## 2024-07-04 MED ORDER — LEVOFLOXACIN 250 MG PO TABS
500.0000 mg | ORAL_TABLET | Freq: Every day | ORAL | 0 refills | Status: DC
  Filled 2024-07-04: qty 14, 7d supply, fill #0

## 2024-07-04 MED ORDER — SODIUM CHLORIDE 0.9 % IV BOLUS
1000.0000 mL | Freq: Once | INTRAVENOUS | Status: AC
  Administered 2024-07-04: 1000 mL via INTRAVENOUS

## 2024-07-04 MED ORDER — VITAMIN D 25 MCG (1000 UNIT) PO TABS
500.0000 [IU] | ORAL_TABLET | Freq: Every day | ORAL | Status: DC
  Administered 2024-07-05: 500 [IU] via ORAL
  Filled 2024-07-04: qty 1

## 2024-07-04 MED ORDER — BUPROPION HCL ER (XL) 150 MG PO TB24
450.0000 mg | ORAL_TABLET | Freq: Every day | ORAL | Status: DC
  Administered 2024-07-05: 450 mg via ORAL
  Filled 2024-07-04: qty 3

## 2024-07-04 MED ORDER — FILGRASTIM-AAFI 480 MCG/0.8ML IJ SOSY
480.0000 ug | PREFILLED_SYRINGE | Freq: Once | INTRAMUSCULAR | Status: DC
  Filled 2024-07-04: qty 0.8

## 2024-07-04 MED ORDER — ONDANSETRON HCL 4 MG/2ML IJ SOLN
4.0000 mg | Freq: Once | INTRAMUSCULAR | Status: AC
  Administered 2024-07-04: 4 mg via INTRAVENOUS
  Filled 2024-07-04: qty 2

## 2024-07-04 MED ORDER — GABAPENTIN 100 MG PO CAPS: 100.0000 mg | ORAL_CAPSULE | Freq: Two times a day (BID) | ORAL | Status: DC

## 2024-07-04 MED ORDER — BUPROPION HCL ER (XL) 150 MG PO TB24: 150.0000 mg | ORAL_TABLET | Freq: Every day | ORAL | Status: DC

## 2024-07-04 MED ORDER — SODIUM CHLORIDE 0.9 % IV SOLN: INTRAVENOUS | Status: DC

## 2024-07-04 MED ORDER — ONDANSETRON HCL 4 MG/2ML IJ SOLN: 4.0000 mg | Freq: Three times a day (TID) | INTRAMUSCULAR | Status: DC | PRN

## 2024-07-04 MED ORDER — HYDROMORPHONE HCL 1 MG/ML IJ SOLN
0.5000 mg | Freq: Once | INTRAMUSCULAR | Status: AC
  Administered 2024-07-04: 0.5 mg via INTRAVENOUS
  Filled 2024-07-04: qty 0.5

## 2024-07-04 MED ORDER — ADULT MULTIVITAMIN W/MINERALS CH
1.0000 | ORAL_TABLET | Freq: Every day | ORAL | Status: DC
  Administered 2024-07-05: 1 via ORAL
  Filled 2024-07-04: qty 1

## 2024-07-04 MED ORDER — CITALOPRAM HYDROBROMIDE 20 MG PO TABS
40.0000 mg | ORAL_TABLET | Freq: Every day | ORAL | Status: DC
  Administered 2024-07-05: 40 mg via ORAL
  Filled 2024-07-04: qty 2

## 2024-07-04 MED ORDER — MAGNESIUM GLUCONATE 500 (27 MG) MG PO TABS
500.0000 mg | ORAL_TABLET | Freq: Every day | ORAL | Status: DC
  Filled 2024-07-04 (×3): qty 1

## 2024-07-04 MED ORDER — GABAPENTIN 100 MG PO CAPS
200.0000 mg | ORAL_CAPSULE | Freq: Every day | ORAL | Status: DC
  Administered 2024-07-04: 200 mg via ORAL
  Filled 2024-07-04: qty 2

## 2024-07-04 MED ORDER — ACETAMINOPHEN 325 MG PO TABS: 650.0000 mg | ORAL_TABLET | Freq: Four times a day (QID) | ORAL | Status: DC | PRN

## 2024-07-04 MED ORDER — CALCIUM CARBONATE 1250 (500 CA) MG PO TABS
1250.0000 mg | ORAL_TABLET | Freq: Two times a day (BID) | ORAL | Status: DC
  Administered 2024-07-05: 1250 mg via ORAL
  Filled 2024-07-04 (×2): qty 1

## 2024-07-04 MED ORDER — PANTOPRAZOLE SODIUM 40 MG PO TBEC
40.0000 mg | DELAYED_RELEASE_TABLET | Freq: Every day | ORAL | Status: DC
  Administered 2024-07-05: 40 mg via ORAL
  Filled 2024-07-04: qty 1

## 2024-07-04 MED ORDER — MORPHINE SULFATE ER 15 MG PO TBCR
30.0000 mg | EXTENDED_RELEASE_TABLET | Freq: Three times a day (TID) | ORAL | Status: DC
  Administered 2024-07-04 – 2024-07-05 (×3): 30 mg via ORAL
  Filled 2024-07-04 (×3): qty 2

## 2024-07-04 MED ORDER — SODIUM CHLORIDE 0.9 % IV BOLUS: 500.0000 mL | Freq: Once | INTRAVENOUS | Status: DC

## 2024-07-04 MED ORDER — HYDROMORPHONE HCL 2 MG PO TABS: 4.0000 mg | ORAL_TABLET | Freq: Four times a day (QID) | ORAL | Status: DC | PRN

## 2024-07-04 MED ORDER — INSULIN ASPART 100 UNIT/ML IJ SOLN: 0.0000 [IU] | Freq: Three times a day (TID) | INTRAMUSCULAR | Status: DC

## 2024-07-04 MED ORDER — GABAPENTIN 100 MG PO CAPS
100.0000 mg | ORAL_CAPSULE | Freq: Every day | ORAL | Status: DC
  Administered 2024-07-05: 100 mg via ORAL
  Filled 2024-07-04: qty 1

## 2024-07-04 MED ORDER — DIPHENOXYLATE-ATROPINE 2.5-0.025 MG PO TABS: 2.0000 | ORAL_TABLET | Freq: Four times a day (QID) | ORAL | Status: DC | PRN

## 2024-07-04 NOTE — ED Triage Notes (Signed)
" °  C/O pain under left breast around to back. Pain worse with movement, breathing, to touch. Intermittent since Saturday, worse today. "

## 2024-07-04 NOTE — ED Notes (Signed)
 Pt given turkey sandwich tray. Eating at this time.

## 2024-07-04 NOTE — Telephone Encounter (Signed)
 Discussed with Dr. Melanee, with the office having holiday closings this week she offers 2 options to patient.  Either proceed to ED for assessment or try steroid 7 day course with decreased interval.    Presented options to patient and she feels more comfortable proceeding to [facility] ED.

## 2024-07-04 NOTE — ED Provider Notes (Signed)
 "  [facility] Provider Note    Event Date/Time   First MD Initiated Contact with Patient 07/04/24 1213     (approximate)   History   No chief complaint on file.   HPI  Jacqueline Deleon is a 63 y.o. female who comes in with pain under the left breast around to the back.  Pain worse with movement breathing and touch.  Has had this intermittently since Saturday.  Reports has become more consistent she has been taking her home morphine  and Dilaudid .  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  Denies any respiratory symptoms  Physical Exam   Triage Vital Signs: ED Triage Vitals  Encounter Vitals Group     BP 07/04/24 1212 114/77     Girls Systolic BP Percentile --      Girls Diastolic BP Percentile --      Boys Systolic BP Percentile --      Boys Diastolic BP Percentile --      Pulse Rate 07/04/24 1212 90     Resp 07/04/24 1212 16     Temp 07/04/24 1212 98.5 F (36.9 C)     Temp Source 07/04/24 1212 Oral     SpO2 07/04/24 1209 97 %     Weight 07/04/24 1210 201 lb 15.1 oz (91.6 kg)     Height --      Head Circumference --      Peak Flow --      Pain Score 07/04/24 1210 10     Pain Loc --      Pain Education --      Exclude from Growth Chart --     Most recent vital signs: Vitals:   07/04/24 1209 07/04/24 1212  BP:  114/77  Pulse:  90  Resp:  16  Temp:  98.5 F (36.9 C)  SpO2: 97% 98%     General: Awake, no distress.  CV:  Good peripheral perfusion.  Resp:  Normal effort.  Abd:  No distention.  Soft and nontender Other:  No skin changes.  She has got some tenderness with palpation.  A little bit of increased swelling on the left leg   ED Results / Procedures / Treatments   Labs (all labs ordered are listed, but only abnormal results are displayed) Labs Reviewed  BASIC METABOLIC PANEL WITH GFR  CBC  TROPONIN T, HIGH SENSITIVITY     EKG  My interpretation of  EKG:  Normal sinus rate of 84 without any ST elevation or T wave inversions, normal intervals  RADIOLOGY I have reviewed the xray personally and interpreted no evidence of any known pneumonia   PROCEDURES:  Critical Care performed: No  .1-3 Lead EKG Interpretation  Performed by: Ernest Ronal BRAVO, MD Authorized by: Ernest Ronal BRAVO, MD     Interpretation: normal     ECG rate:  80   ECG rate assessment: normal     Rhythm: sinus rhythm     Ectopy: none     Conduction: normal      MEDICATIONS ORDERED IN ED: Medications  HYDROmorphone  (DILAUDID ) injection 0.5 mg (0.5 mg Intravenous Given 07/04/24 1307)  ondansetron  (ZOFRAN ) injection 4 mg (4 mg Intravenous Given 07/04/24 1307)     IMPRESSION / MDM / ASSESSMENT AND PLAN / ED COURSE  I reviewed the triage vital signs and the nursing notes.   Patient's presentation is most consistent with acute  presentation with potential threat to life or bodily function.   Patient comes in with pleuritic left-sided chest pain the setting of known cancer with a little bit of increased swelling in her left leg therefore concern for PE will proceed with ultrasound, CT PE to further evaluate and get cardiac markers to evaluate for ACS. Ct head order to ensure no large mass if pt needs anticoagulation.  COVID, flu were negative.  Her CBC shows hemoglobin of 8.7 that has been downtrending.  Slightly low white count.  Troponin was negative  2:26 PM delay in care because cmp never ran--patient reports that she is on some type of medication every 3 weeks that she takes for her cancer.  She denies any black tarry stools.  She does report that over the last few weeks her blood pressure has been running a little bit low. She just had an echocardiogram done on 05/28/2024 that showed some mild diastolic dysfunction we will give a little bit of fluids with 500 cc.  Patient will be handed off to oncoming team pending CT imaging and further evaluation.  I did discuss  with patient her slightly low hemoglobin and white count as she is higher risk for infections and she expressed understanding that needs to be safe over Christmas and the holidays and avoid any sick contacts.   The patient is on the cardiac monitor to evaluate for evidence of arrhythmia and/or significant heart rate changes.      FINAL CLINICAL IMPRESSION(S) / ED DIAGNOSES   Final diagnoses:  Shortness of breath  Malignant neoplasm of female breast, unspecified estrogen receptor status, unspecified laterality, unspecified site of breast (HCC)     Rx / DC Orders   ED Discharge Orders     None        Note:  This document was prepared using Dragon voice recognition software and may include unintentional dictation errors.   Ernest Ronal BRAVO, MD 07/04/24 1431  "

## 2024-07-04 NOTE — ED Notes (Signed)
 BP cuff changed to smaller size that more appropriately fit pt.

## 2024-07-04 NOTE — ED Notes (Signed)
 RN spoke with lab about CMP and lipase not appearing to be in process. Lab reports they had not ran CMP but will put it in process now.

## 2024-07-04 NOTE — Telephone Encounter (Signed)
 Per Dr. Melanee bence, I have cancelled udenyca. please ask her not to come. we should try for p2p for subsequent infusions. we can do that next week. review neutropenic precautions with her. send her a prescription for levaquin  500 mg daily for 7 days which she can take in case she comes down with s/s of infection.  Outbound call to patient; informed of above.  Reviewed neutropenic precautions with patients including the following: Wash hands frequently with soap and water or use alcohol-based sanitizer. Avoid crowds and close contact with people who are sick or recently ill. Do not share food, drinks, utensils, or personal items. Avoid raw or undercooked meat, eggs, and seafood, and wash fruits and vegetables thoroughly before eating.  Check your temperature daily and call your healthcare provider right away for a fever of 100.60F (38C) or higher, chills, sore throat, cough, burning with urination, or any signs of infection.  Patient requested Levaquin  to be sent to [facility] since she has one other medication to pick up there anyway.  Informed patient I will keep her posted as information becomes available regarding Udenyca.  Patient asked me to relay to Dr. Melanee she has an appointment with [facility] next week 07/10/24.

## 2024-07-04 NOTE — ED Notes (Signed)
 MD made aware of pt hypotensive at 80/45. Pt denies any new complaints and is oriented x4. Pt mentation at baseline.

## 2024-07-04 NOTE — ED Notes (Signed)
 CCMD called to place pt on central monitoring.

## 2024-07-04 NOTE — H&P (Addendum)
 " History and Physical    Jacqueline Deleon FMW:[id number] DOB: [birth year] DOA: 07/04/2024  Referring MD/NP/PA:   PCP: Diedra Lame, MD   Patient coming from:  The patient is coming from home.     Chief Complaint: pain under left breast   HPI: Jacqueline Deleon is a 63 y.o. female with medical history significant of stage IV breast cancer metastasized to the bone on immunotherapy, chemotherapy induced neutropenia, HTN, HLD, DM, GERD, depression, anxiety, IBS, liver disease due to [place], OSA on CPAP, who presents with pain in the left breast.  Patient states that she has intermittent pain under left breast area, which becomes more constant today.  The pain is sharp, severe, initially 10 out of 10 in severity, currently 5 out of 10 in severity, radiating to the left back, aggravated by deep breath and movement.  Denies any injury.  No SOB and cough.  No fever or chills.  Patient does not have nausea, vomiting, diarrhea or abdominal pain.  No symptoms of UTI.  She has mild left ankle swelling for several months.  Reports poor appetite and decreased oral intake recently.  Patient denies dark stool or rectal bleeding.  Patient was found to have hypotension with blood pressure 71/56, which improved to 112/64 after giving 2 L of NS bolus, then trending down to SBP 90s in ED when I saw pt.    Data reviewed independently and ED Course: pt was found to have WBC 2.5, hemoglobin 8.7 (gradually trending down recently), GFR > 60, negative PCR for COVID, flu and RSV, lipase<10, troponin negative x 2, INR 1.0, PTT 30.  Temperature normal, heart rate 70-90s, RR 21, oxygen saturation 97% on room air.  Chest x-ray negative.  Left lower extremity venous Doppler negative for DVT.  Patient is admitted to PCU as inpatient.  CTA: 1. Negative for acute pulmonary embolus or aortic dissection. 2. Interval development of patchy mosaic density in the bilateral lungs, possible small airways disease. 3. Widespread skeletal  metastatic disease as before. Possible subtle acute nondisplaced left anterior sixth rib fracture. 4. Stable 6 x 4 mm right middle lobe nodular density, question scar like configuration on today's study. Continued attention on imaging follow-up.  CT of head: 1. No acute intracranial abnormality. 2. Small chronic left cerebellar infarct.   CT abdomen/pelvis: 1. No CT evidence for acute intra-abdominal or pelvic abnormality. 2. Wide spread lucent and sclerotic skeletal metastatic disease which appears slightly progressive compared with July exam. 3. Numerous vague hypodense liver lesions, more conspicuous compared to the CT from July and correspond to numerous metastatic lesions on interval recent MRI. 4. 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   EKG: I have personally reviewed.  Sinus rhythm, QTc 403, LAE, low voltage, poor R wave progression.   Review of Systems:   General: no fevers, chills, no body weight gain, has poor appetite, has fatigue HEENT: no blurry vision, hearing changes or sore throat Respiratory: no dyspnea, coughing, wheezing CV: no chest pain, no palpitations GI: no nausea, vomiting, abdominal pain, diarrhea, constipation GU: no dysuria, burning on urination, increased urinary frequency, hematuria  Ext: has mild left ankle edema Neuro: no unilateral weakness, numbness, or tingling, no vision change or hearing loss Skin: no rash, no skin tear. MSK: No muscle spasm, no deformity, no limitation of range of movement in spin. Has pain under left breat area. Heme: No easy bruising.  Travel history: No recent  long distant travel.   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   Chicken pox    Depression    Fatty liver    Fatty liver    Gastric polyps    GERD (gastroesophageal reflux disease)    Gestational diabetes    prediabetes out side of having baby   Headache    migraines   Heart murmur    History of abnormal mammogram 08/2006, 01/2008, 03/2009   Hypertension    IBS (irritable bowel syndrome)    Joint disorder    hx of left ankle tendonitis, bursitis, heel spur   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 Smith/ laprascopic surgery   COLONOSCOPY  04/04/2000   COLONOSCOPY WITH PROPOFOL  N/A 02/12/2019   Procedure: COLONOSCOPY WITH PROPOFOL ;  Surgeon: Gaylyn Gladis PENNER, MD;  Location: [facility] ENDOSCOPY;  Service: Endoscopy;  Laterality: N/A;   ESOPHAGOGASTRODUODENOSCOPY (EGD) WITH PROPOFOL  N/A 05/01/2015   Procedure: ESOPHAGOGASTRODUODENOSCOPY (EGD) WITH PROPOFOL ;  Surgeon: Deward CINDERELLA Piedmont, MD;  Location: [facility] ENDOSCOPY;  Service: Gastroenterology;  Laterality: N/A;   ESOPHAGOGASTRODUODENOSCOPY (EGD) WITH PROPOFOL  N/A 02/12/2019   Procedure: ESOPHAGOGASTRODUODENOSCOPY (EGD) WITH PROPOFOL ;  Surgeon: Gaylyn Gladis PENNER, MD;  Location: [facility] ENDOSCOPY;   Service: Endoscopy;  Laterality: N/A;   EYE SURGERY  08/2012   laser surgery on retina/ DR Appenzeller   FINGER SURGERY     repair of tendon in right index, Dr. Nancyann in [facility] SUR[facility]ERY     Dr. Verta   IR IMAGING GUIDED PORT INSERTION  05/31/2024   IRRIGATION AND DEBRIDEMENT SEBACEOUS CYST     right upper back   LAPAROSCOPIC SUPRACERVICAL HYSTERECTOMY  06/2007   Dr Kincius/ adhesions/CPP/adenomyosis   MASTECTOMY PARTIAL / LUMPECTOMY Right 01/2008   with sentinal lymph node biopsy/ infiltrating ductal cancer   REPAIR PERONEAL TENDONS ANKLE  10/2019   with tarsal exostectomy and sural neuroma excision on right    SKIN CANCER EXCISION     back and calves   WISDOM TOOTH EXTRACTION  1991    Social History:  reports that she has never smoked. She has never used smokeless tobacco. She reports current alcohol use. She reports that she does not use drugs.  Family History:  Family History  Problem Relation Age of Onset  Hypertension Mother    Thyroid  disease Mother    Breast cancer Mother 45   Colon cancer Mother 61       again at 3   Atrial fibrillation Mother    Hip fracture Mother    Skin cancer Mother    Stroke Father    Hypertension Father    Cancer Father 88       prostate   Parkinson's disease Father    Skin cancer Father    Atrial fibrillation Sister        Pacemaker inserted end of Sept-beginning of Oct. 2024   Hypertension Sister    Thyroid  disease Sister    Cancer Sister        skin/ squamous cell   Heart Problems Sister    Diabetes Maternal Aunt    Cancer Maternal Aunt    Breast cancer Maternal Aunt 91   Lymphoma Maternal Uncle    Heart disease Maternal Uncle    Melanoma Maternal Uncle 62   Lung cancer Paternal Aunt 22       smoker   Diabetes Maternal Grandmother    Stroke Maternal Grandfather    Heart disease Paternal Grandfather    Thyroid  cancer Cousin        maternal   Breast cancer Cousin 98       maternal   Breast cancer Cousin 14    Colon cancer Cousin      Prior to Admission medications  Medication Sig Start Date End Date Taking? Authorizing Provider  aspirin EC 81 MG tablet Take by mouth. 05/20/22   [provider]  b complex vitamins capsule Take 1 capsule by mouth daily.    [provider]  Blood Glucose Monitoring Suppl (BLOOD GLUCOSE MONITOR SYSTEM) w/Device KIT Use to test blood sugars in the morning, at noon, and at bedtime. 12/29/23   Melanee Annah BROCKS, MD  buPROPion  (WELLBUTRIN  XL) 150 MG 24 hr tablet Take 1 tablet (150 mg total) by mouth daily. Take along a 300mg  tablet for a total daily dose of 450mg . 11/21/23     buPROPion  (WELLBUTRIN  XL) 300 MG 24 hr tablet Take 1 tablet (300 mg total) by mouth daily. 07/27/23     busPIRone  (BUSPAR ) 10 MG tablet Take 1 tablet (10 mg total) by mouth 2 (two) times daily 03/28/24     calcium  carbonate (OSCAL) 1500 (600 Ca) MG TABS tablet Take by mouth 2 (two) times daily with a meal.    [provider]  capivasertib  (TRUQAP ) 160 MG pack Take 2 tablets (320 mg total) by mouth 2 (two) times daily. Take for 4 days, then hold for 3 days. Repeat every 7 days. 05/15/24   Melanee Annah BROCKS, MD  Cholecalciferol  (VITAMIN D3) 10 MCG (400 UNIT) CAPS Take by mouth.    [provider]  citalopram  (CELEXA ) 40 MG tablet Take 1 tablet (40 mg total) by mouth daily. 09/09/23     clotrimazole  (LOTRIMIN ) 1 % cream Apply topically 2 (two) times daily.    [provider]  diphenhydrAMINE  (BENADRYL ) 25 MG tablet Take 25 mg by mouth at bedtime as needed for sleep. Pt states daily now for sleep and allergies.    [provider]  diphenoxylate -atropine  (LOMOTIL ) 2.5-0.025 MG tablet Take 2 tablets by mouth 4 (four) times daily as needed for diarrhea or loose stools. 05/14/24   Melanee Annah BROCKS, MD  gabapentin  (NEURONTIN ) 100 MG capsule Take 1 capsule (100 mg total)  By mouth every morning , THEN  2 capsules (200 mg total) every evening. 05/28/24 11/24/24  Diedra Lame, MD  HYDROmorphone  (DILAUDID ) 2 MG tablet Take 2 tablets (4 mg total) by mouth every 6 (six) hours as needed for severe pain (pain score 7-10). 07/03/24   Melanee Annah BROCKS, MD  ketotifen  (ZADITOR ) 0.025 % ophthalmic solution Place 1 drop into both eyes daily. Patient taking differently: Place 1 drop into both eyes as needed.    [provider]  levofloxacin  (LEVAQUIN ) 250 MG tablet Take 2 tablets (500 mg total) by mouth daily. 07/04/24   Melanee Annah BROCKS, MD  lidocaine -prilocaine  (EMLA ) cream Apply to affected area once as directed. 05/26/24   Melanee Annah BROCKS, MD  Magnesium  Glycinate 100 MG CAPS  09/26/23   [provider]  metFORMIN  (GLUCOPHAGE -XR) 500 MG 24 hr tablet Take 1 tablet (500 mg total) by mouth 2 (two) times daily with a meal. 12/12/23     morphine  (MS CONTIN ) 30 MG 12 hr tablet Take 1 tablet (30 mg total) by mouth every 8 (eight) hours. 06/11/24   Rao, Archana C, MD  Multiple Vitamin (MULTIVITAMIN) tablet Take 1 tablet by mouth daily.    [provider]  nitrofurantoin , macrocrystal-monohydrate, (MACROBID ) 100 MG capsule Take 1 capsule (100 mg total) by mouth 2 (two) times daily. Patient taking differently: Take 100 mg by mouth as needed. 03/26/24   Melanee Annah BROCKS, MD  omeprazole  (PRILOSEC) 20 MG capsule Take 1 capsule (20 mg total) by mouth daily. 04/18/24   Melanee Annah BROCKS, MD  ondansetron  (ZOFRAN ) 4 MG tablet Take 1 tablet (4 mg total) by mouth every 8 (eight) hours as needed for nausea or vomiting. 01/18/23   Tonette Lauraine HERO, PA-C  ondansetron  (ZOFRAN ) 8 MG tablet Take 1 tablet (8 mg total) by mouth every 8 (eight) hours as needed for nausea or vomiting. Start on the third day after chemotherapy. 05/26/24   Melanee Annah BROCKS, MD    Physical Exam: Vitals:   07/04/24 2115 07/04/24 2130 07/04/24 2216 07/04/24 2316  BP: (!) 103/57 (!) 105/51 131/76 106/74  Pulse: 74 82 79 70  Resp: 11 15 20 18   Temp:   99.5 F (37.5 C) 98.4 F (36.9 C)  TempSrc:      SpO2:  98% 97% 100% 99%  Weight:       General: Not in acute distress HEENT:       Eyes: PERRL, EOMI, no jaundice       ENT: No discharge from the ears and nose, no pharynx injection, no tonsillar enlargement.        Neck: No JVD, no bruit, no mass felt. Heme: No neck lymph node enlargement. Cardiac: S1/S2, RRR, No gallops or rubs. Respiratory: No rales, wheezing, rhonchi or rubs. GI: Soft, nondistended, nontender, no rebound pain, no organomegaly, BS present. GU: No hematuria Ext: Has trace left ankle edema.  1+DP/PT pulse bilaterally. Musculoskeletal: No joint deformities, No joint redness or warmth, no limitation of ROM in spin. Has left lateral chest wall tenderness Skin: No rashes.  Neuro: Alert, oriented X3, cranial nerves II-XII grossly intact, moves all extremities normally.  Psych: Patient is not psychotic, no suicidal or hemocidal ideation.  Labs on Admission: I have personally reviewed following labs and imaging studies  CBC: Recent Labs  Lab 07/02/24 1239 07/04/24 1303  WBC 3.1* 2.5*  NEUTROABS 1.5*  --   HGB 9.4* 8.7*  HCT 31.2* 29.6*  MCV 84.8 87.3  PLT 271 223   Basic Metabolic Panel: Recent Labs  Lab 07/02/24 1239 07/04/24 1303  NA 140 142  K 4.5 4.8  CL 106 106  CO2 24 25  GLUCOSE 99 111*  BUN 12 14  CREATININE 0.99 0.92  CALCIUM  8.5* 7.5*   GFR: Estimated Creatinine Clearance: 69 mL/min (by C-G formula based on SCr of 0.92 mg/dL). Liver Function Tests: Recent Labs  Lab 07/02/24 1239 07/04/24 1303  AST 28 27  ALT 23 20  ALKPHOS 219* 190*  BILITOT 0.2 0.2  PROT 5.7* 5.3*  ALBUMIN 3.4* 3.1*   Recent Labs  Lab 07/04/24 1303  LIPASE <10*   No results for input(s): AMMONIA in the last 168 hours. Coagulation Profile: Recent Labs  Lab 07/04/24 1303  INR 1.0   Cardiac Enzymes: No results for input(s): CKTOTAL, CKMB, CKMBINDEX, TROPONINI in the last 168 hours. BNP (last 3 results) No results for input(s): PROBNP in the last 8760  hours. HbA1C: No results for input(s): HGBA1C in the last 72 hours. CBG: Recent Labs  Lab 07/04/24 2258  GLUCAP 114*   Lipid Profile: No results for input(s): CHOL, HDL, LDLCALC, TRIG, CHOLHDL, LDLDIRECT in the last 72 hours. Thyroid  Function Tests: No results for input(s): TSH, T4TOTAL, FREET4, T3FREE, THYROIDAB in the last 72 hours. Anemia Panel: Recent Labs    07/04/24 2228  RETICCTPCT 2.3   Urine analysis:    Component Value Date/Time   COLORURINE AMBER (A) 01/31/2024 0839   APPEARANCEUR HAZY (A) 01/31/2024 0839   APPEARANCEUR Clear 05/22/2012 1136   LABSPEC 1.018 01/31/2024 0839   PHURINE 7.0 01/31/2024 0839   GLUCOSEU NEGATIVE 01/31/2024 0839   HGBUR NEGATIVE 01/31/2024 0839   BILIRUBINUR NEGATIVE 01/31/2024 0839   BILIRUBINUR Negative 12/09/2021 1152   BILIRUBINUR Negative 05/22/2012 1136   KETONESUR NEGATIVE 01/31/2024 0839   PROTEINUR 30 (A) 01/31/2024 0839   UROBILINOGEN negative (A) 12/09/2021 1152   NITRITE NEGATIVE 01/31/2024 0839   LEUKOCYTESUR MODERATE (A) 01/31/2024 0839   Sepsis Labs: @LABRCNTIP (procalcitonin:4,lacticidven:4) ) Recent Results (from the past 240 hours)  Resp panel by RT-PCR (RSV, Flu A&B, Covid) Anterior Nasal Swab     Status: None   Collection Time: 07/04/24  1:03 PM   Specimen: Anterior Nasal Swab  Result Value Ref Range Status   SARS Coronavirus 2 by RT PCR NEGATIVE NEGATIVE Final    Comment: (NOTE) SARS-CoV-2 target nucleic acids are NOT DETECTED.  The SARS-CoV-2 RNA is generally detectable in upper respiratory specimens during the acute phase of infection. The lowest concentration of SARS-CoV-2 viral copies this assay can detect is 138 copies/mL. A negative result does not preclude SARS-Cov-2 infection and should not be used as the sole basis for treatment or other patient management decisions. A negative result may occur with  improper specimen collection/handling, submission of specimen other than  nasopharyngeal swab, presence of viral mutation(s) within the areas targeted by this assay, and inadequate number of viral copies(<138 copies/mL). A negative result must be combined with clinical observations, patient history, and epidemiological information. The expected result is Negative.  Fact Sheet for Patients:  bloggercourse.com  Fact Sheet for Healthcare Providers:  seriousbroker.it  This test is no t yet approved or cleared by the [place]  FDA and  has been authorized for detection and/or diagnosis of SARS-CoV-2 by FDA under an Emergency Use Authorization (EUA). This EUA will remain  in effect (meaning this test can be used) for the duration of the COVID-19 declaration under Section 564(b)(1) of the Act, 21 U.S.C.section 360bbb-3(b)(1), unless the authorization is terminated  or revoked sooner.  Influenza A by PCR NEGATIVE NEGATIVE Final   Influenza B by PCR NEGATIVE NEGATIVE Final    Comment: (NOTE) The Xpert Xpress SARS-CoV-2/FLU/RSV plus assay is intended as an aid in the diagnosis of influenza from Nasopharyngeal swab specimens and should not be used as a sole basis for treatment. Nasal washings and aspirates are unacceptable for Xpert Xpress SARS-CoV-2/FLU/RSV testing.  Fact Sheet for Patients: bloggercourse.com  Fact Sheet for Healthcare Providers: seriousbroker.it  This test is not yet approved or cleared by the [place]  FDA and has been authorized for detection and/or diagnosis of SARS-CoV-2 by FDA under an Emergency Use Authorization (EUA). This EUA will remain in effect (meaning this test can be used) for the duration of the COVID-19 declaration under Section 564(b)(1) of the Act, 21 U.S.C. section 360bbb-3(b)(1), unless the authorization is terminated or revoked.     Resp Syncytial Virus by PCR NEGATIVE NEGATIVE Final    Comment:  (NOTE) Fact Sheet for Patients: bloggercourse.com  Fact Sheet for Healthcare Providers: seriousbroker.it  This test is not yet approved or cleared by the [place]  FDA and has been authorized for detection and/or diagnosis of SARS-CoV-2 by FDA under an Emergency Use Authorization (EUA). This EUA will remain in effect (meaning this test can be used) for the duration of the COVID-19 declaration under Section 564(b)(1) of the Act, 21 U.S.C. section 360bbb-3(b)(1), unless the authorization is terminated or revoked.  Performed at [facility], [address] [place], [place] [address]      Radiological Exams on Admission:   Assessment/Plan Principal Problem:   Hypotension Active Problems:   Left rib fracture_6th rib   Primary malignant neoplasm of breast with metastasis (HCC)   Normocytic anemia   Chemotherapy induced neutropenia   Hypertension   Diabetes mellitus without complication (HCC)   Chronic diastolic CHF (congestive heart failure) (HCC)   Depression with anxiety   OSA on CPAP   Obesity (BMI 30-39.9)   Non-alcoholic fatty liver disease   Assessment and Plan:  Hypotension: hypotension with blood pressure 71/56, which improved to 112/64 after giving 2 L of NS bolus, then trending down to SBP 90s in ED. etiology is not clear.  Patient reports poor appetite and decreased oral intake, indicating possible volume depletion.  Patient has chemotherapy induced neutropenia with WBC 2.5 today, but had no fever.  No source of infection identified.  -will place in telemetry bed for observationn  - Started midodrine  10 mg 3 times daily - IVF: 3 L normal saline, Naima 100 cc/h - f/u blood culture and urine analysis  Left rib fracture_6th rib: her pain under left breast area is likely due to left sixth rib fracture as shown by CT scan. CTA negative for PE, but showed possible subtle acute nondisplaced left anterior  sixth rib fracture.  This is likely pathologic fracture. - Incentive spirometry - Continue home MS Contin  30 mg 3 times daily and oral Dilaudid  as needed - As needed Tylenol  - Lidoderm  patch  Primary malignant neoplasm of breast with metastasis (HCC): stage IV, metastasized to bone and liver.  Currently on immunotherapy.  Last treatment was on 12/22. - Following up with Dr. Melanee of oncology.  Normocytic anemia: Hemoglobin has been gradually treating down from 10.2 on 05/23/2024 --> 9.7 on 06/11/2024 --> 9.4 on 07/02/2024 -->  8.7 today. Patient denies rectal bleeding or dark stool.  Possibly due to chemotherapy. - Follow-up by CBC - Check anemia panel  Chemotherapy induced neutropenia: WBC 2.5.  Chart review revealed  that Dr. Melanee is trying to get insurance approval for G-CSF. - Check differential -  will order Neupogen  5 mcg/kg once --> per pharmacist, we do not have NICU Neupogen  syringes here, we only have Nivestym , so changed to Nivestym . Will be given in AM after her blood pressure is fully stabilized.  Hypertension: Has hypotension now.  Patient is not taking medications currently. -monitoring Bp closely  Diabetes mellitus without complication [facility]): Recent A1c 6.1, well-controlled..  Patient is taking metformin  - SSI  Chronic diastolic CHF (congestive heart failure) (HCC): 2D echo on 06/06/2024 showed EF 60-65% with grade 1 diastolic dysfunction.  Patient has some mild left ankle edema, but no SOB.  CHF seems to be compensated. -Check proBNP  Depression with anxiety -BuSpar , Wellbutrin  and Celexa   OSA - on CPAP  Obesity (BMI 30-39.9): Patient has Obesity Class II, with body weight 91.6 Kg and BMI35.77  kg/m2.  - Encourage losing weight - Exercise and healthy diet  Non-alcoholic fatty liver disease: Mental status normal. - Check ammonia level     DVT ppx: SCD  Code Status: Full code   Family Communication:   Yes, patient's husband   at bed side.  Disposition Plan:   Anticipate discharge back to previous environment  Consults called:  none  Admission status and Level of care: Progressive:    for obs     Dispo: The patient is from: Home              Anticipated d/c is to: Home              Anticipated d/c date is: 1 day              Patient currently is not medically stable to d/c.    Severity of Illness:  The appropriate patient status for this patient is OBSERVATION. Observation status is judged to be reasonable and necessary in order to provide the required intensity of service to ensure the patient's safety. The patient's presenting symptoms, physical exam findings, and initial radiographic and laboratory data in the context of their medical condition is felt to place them at decreased risk for further clinical deterioration. Furthermore, it is anticipated that the patient will be medically stable for discharge from the hospital within 2 midnights of admission.        Date of Service 07/04/2024    Caleb Exon [facility]   If 7PM-7AM, please contact night-coverage [email or web address] 07/04/2024, 11:16 PM     [1]  Allergies Allergen Reactions   Kiwi Extract Itching and Swelling    The real kiwi fruit   Codeine Other (See Comments)    Felt funny.   Food Itching and Swelling    grapes   Amoxicillin Rash   Erythromycin Rash   Sulfa  Antibiotics Rash   Tape Rash    Adhesives  Pt can have paper tape   "

## 2024-07-04 NOTE — Telephone Encounter (Signed)
 Patient concerned with increasing pain located under left breast that radiates to scapula.  Pain started 3 days ago but has continued to worsen with toady being 10/10 pain scale, did take pain meds at 9:00 am with no pain relief.  The pain is causing difficulty with movement as well as breathing.  Describes pain as chest wall and states that she does have known mets to rib cage.

## 2024-07-05 ENCOUNTER — Encounter: Payer: Self-pay | Admitting: Internal Medicine

## 2024-07-05 LAB — CBC WITH DIFFERENTIAL/PLATELET
Abs Immature Granulocytes: 0.02 K/uL (ref 0.00–0.07)
Basophils Absolute: 0 K/uL (ref 0.0–0.1)
Basophils Relative: 1 %
Eosinophils Absolute: 0.1 K/uL (ref 0.0–0.5)
Eosinophils Relative: 6 %
HCT: 29.6 % — ABNORMAL LOW (ref 36.0–46.0)
Hemoglobin: 8.6 g/dL — ABNORMAL LOW (ref 12.0–15.0)
Immature Granulocytes: 1 %
Lymphocytes Relative: 32 %
Lymphs Abs: 0.7 K/uL (ref 0.7–4.0)
MCH: 25.4 pg — ABNORMAL LOW (ref 26.0–34.0)
MCHC: 29.1 g/dL — ABNORMAL LOW (ref 30.0–36.0)
MCV: 87.3 fL (ref 80.0–100.0)
Monocytes Absolute: 0.3 K/uL (ref 0.1–1.0)
Monocytes Relative: 11 %
Neutro Abs: 1.1 K/uL — ABNORMAL LOW (ref 1.7–7.7)
Neutrophils Relative %: 49 %
Platelets: 225 K/uL (ref 150–400)
RBC: 3.39 MIL/uL — ABNORMAL LOW (ref 3.87–5.11)
RDW: 22.7 % — ABNORMAL HIGH (ref 11.5–15.5)
Smear Review: NORMAL
WBC: 2.3 K/uL — ABNORMAL LOW (ref 4.0–10.5)
nRBC: 0 % (ref 0.0–0.2)

## 2024-07-05 LAB — GLUCOSE, CAPILLARY
Glucose-Capillary: 95 mg/dL (ref 70–99)
Glucose-Capillary: 94 mg/dL (ref 70–99)

## 2024-07-05 LAB — BASIC METABOLIC PANEL WITH GFR
Anion gap: 9 (ref 5–15)
BUN: 10 mg/dL (ref 8–23)
CO2: 23 mmol/L (ref 22–32)
Calcium: 6.9 mg/dL — ABNORMAL LOW (ref 8.9–10.3)
Chloride: 109 mmol/L (ref 98–111)
Creatinine, Ser: 0.85 mg/dL (ref 0.44–1.00)
GFR, Estimated: >60 mL/min
Glucose, Bld: 88 mg/dL (ref 70–99)
Potassium: 4.6 mmol/L (ref 3.5–5.1)
Sodium: 141 mmol/L (ref 135–145)

## 2024-07-05 LAB — DIFFERENTIAL
Abs Immature Granulocytes: 0.01 K/uL (ref 0.00–0.07)
Basophils Absolute: 0 K/uL (ref 0.0–0.1)
Basophils Relative: 1 %
Eosinophils Absolute: 0.1 K/uL (ref 0.0–0.5)
Eosinophils Relative: 7 %
Immature Granulocytes: 1 %
Lymphocytes Relative: 29 %
Lymphs Abs: 0.6 K/uL — ABNORMAL LOW (ref 0.7–4.0)
Monocytes Absolute: 0.2 K/uL (ref 0.1–1.0)
Monocytes Relative: 10 %
Neutro Abs: 1.1 K/uL — ABNORMAL LOW (ref 1.7–7.7)
Neutrophils Relative %: 52 %
Smear Review: NORMAL

## 2024-07-05 LAB — CBC
HCT: 28.3 % — ABNORMAL LOW (ref 36.0–46.0)
Hemoglobin: 8.3 g/dL — ABNORMAL LOW (ref 12.0–15.0)
MCH: 25.7 pg — ABNORMAL LOW (ref 26.0–34.0)
MCHC: 29.3 g/dL — ABNORMAL LOW (ref 30.0–36.0)
MCV: 87.6 fL (ref 80.0–100.0)
Platelets: 218 K/uL (ref 150–400)
RBC: 3.23 MIL/uL — ABNORMAL LOW (ref 3.87–5.11)
RDW: 22.8 % — ABNORMAL HIGH (ref 11.5–15.5)
WBC: 2.1 K/uL — ABNORMAL LOW (ref 4.0–10.5)
nRBC: 0 % (ref 0.0–0.2)

## 2024-07-05 LAB — HIV ANTIBODY (ROUTINE TESTING W REFLEX): HIV Screen 4th Generation wRfx: NONREACTIVE

## 2024-07-05 LAB — VITAMIN B12: Vitamin B-12: 1873 pg/mL — ABNORMAL HIGH (ref 180–914)

## 2024-07-05 LAB — FOLATE: Folate: >20 ng/mL

## 2024-07-05 MED ORDER — INFLUENZA VIRUS VACC SPLIT PF (FLUZONE) 0.5 ML IM SUSY: 0.5000 mL | PREFILLED_SYRINGE | INTRAMUSCULAR | Status: DC

## 2024-07-05 MED ORDER — MIDODRINE HCL 10 MG PO TABS: 10.0000 mg | ORAL_TABLET | Freq: Three times a day (TID) | ORAL | 0 refills | Status: DC

## 2024-07-05 MED ORDER — MIDODRINE HCL 10 MG PO TABS: 10.0000 mg | ORAL_TABLET | Freq: Three times a day (TID) | ORAL | 0 refills | Status: AC

## 2024-07-05 MED ORDER — CHLORHEXIDINE GLUCONATE CLOTH 2 % EX PADS: 6.0000 | MEDICATED_PAD | Freq: Every day | CUTANEOUS | Status: DC

## 2024-07-05 NOTE — Plan of Care (Signed)
  Problem: Nutritional: Goal: Maintenance of adequate nutrition will improve Outcome: Progressing   Problem: Metabolic: Goal: Ability to maintain appropriate glucose levels will improve Outcome: Progressing   Problem: Clinical Measurements: Goal: Ability to maintain clinical measurements within normal limits will improve Outcome: Progressing   Problem: Pain Managment: Goal: General experience of comfort will improve and/or be controlled Outcome: Progressing   Problem: Safety: Goal: Ability to remain free from injury will improve Outcome: Progressing

## 2024-07-05 NOTE — Discharge Summary (Signed)
 " Physician Discharge Summary   Patient: Jacqueline Deleon MRN: [id number] DOB: 1960-09-03  Admit date:     07/04/2024  Discharge date: 07/05/2024  Discharge Physician: Drue ONEIDA Potter   PCP: Diedra Lame, MD   Recommendations at discharge:  Seen and examined at bedside this morning  Discharge Diagnoses: Principal Problem:   Hypotension Active Problems:   Left rib fracture_6th rib   Primary malignant neoplasm of breast with metastasis (HCC)   Normocytic anemia   Chemotherapy induced neutropenia   Hypertension   Diabetes mellitus without complication (HCC)   Chronic diastolic CHF (congestive heart failure) (HCC)   Depression with anxiety   OSA on CPAP   Obesity (BMI 30-39.9)   Non-alcoholic fatty liver disease  Resolved Problems:   * No resolved hospital problems. *  Hospital Course:  Jacqueline Deleon is a 63 y.o. female with medical history significant of stage IV breast cancer metastasized to the bone on immunotherapy, chemotherapy induced neutropenia, HTN, HLD, DM, GERD, depression, anxiety, IBS, liver disease due to [facility], OSA on CPAP, who presents with pain in the left breast.  Patient found to have possible seizure related to rib fracture.  Was also found some low blood pressure that responded to IV fluid and midodrine .  CT scan of the chest did not show any evidence of PE.  Patient has therefore been cleared for discharge today and will follow-up with her outpatient physicians.  Consultants: None Procedures performed: None Disposition: Home Diet recommendation:  Cardiac diet DISCHARGE MEDICATION: Allergies as of 07/05/2024       Reactions   Kiwi Extract Itching, Swelling   The real kiwi fruit   Codeine Other (See Comments)   Felt funny.   Food Itching, Swelling   grapes   Amoxicillin Rash   Erythromycin Rash   Sulfa  Antibiotics Rash   Tape Rash   Adhesives  Pt can have paper tape        Medication List     STOP taking these medications    levofloxacin   250 MG tablet Commonly known as: Levaquin        TAKE these medications    Accu-Chek Guide w/Device Kit Use to test blood sugars in the morning, at noon, and at bedtime.   aspirin EC 81 MG tablet Take by mouth.   b complex vitamins capsule Take 1 capsule by mouth daily.   buPROPion  300 MG 24 hr tablet Commonly known as: WELLBUTRIN  XL Take 1 tablet (300 mg total) by mouth daily.   buPROPion  150 MG 24 hr tablet Commonly known as: WELLBUTRIN  XL Take 1 tablet (150 mg total) by mouth daily. Take along a 300mg  tablet for a total daily dose of 450mg .   busPIRone  10 MG tablet Commonly known as: BUSPAR  Take 1 tablet (10 mg total) by mouth 2 (two) times daily   calcium  carbonate 1500 (600 Ca) MG Tabs tablet Commonly known as: OSCAL Take by mouth 2 (two) times daily with a meal.   citalopram  40 MG tablet Commonly known as: CELEXA  Take 1 tablet (40 mg total) by mouth daily.   clotrimazole  1 % cream Commonly known as: LOTRIMIN  Apply topically 2 (two) times daily.   diphenhydrAMINE  25 MG tablet Commonly known as: BENADRYL  Take 25 mg by mouth at bedtime as needed for sleep. Pt states daily now for sleep and allergies.   diphenoxylate -atropine  2.5-0.025 MG tablet Commonly known as: LOMOTIL  Take 2 tablets by mouth 4 (four) times daily as needed for diarrhea or loose stools.  gabapentin  100 MG capsule Commonly known as: NEURONTIN  Take 1 capsule (100 mg total)  By mouth every morning , THEN 2 capsules (200 mg total) every evening. Start taking on: May 28, 2024   HYDROmorphone  2 MG tablet Commonly known as: Dilaudid  Take 2 tablets (4 mg total) by mouth every 6 (six) hours as needed for severe pain (pain score 7-10).   ketotifen  0.025 % ophthalmic solution Commonly known as: ZADITOR  Place 1 drop into both eyes daily. What changed:  when to take this reasons to take this   lidocaine -prilocaine  cream Commonly known as: EMLA  Apply to affected area once as directed.    Magnesium  Glycinate 100 MG Caps   metFORMIN  500 MG 24 hr tablet Commonly known as: GLUCOPHAGE -XR Take 1 tablet (500 mg total) by mouth 2 (two) times daily with a meal.   midodrine  10 MG tablet Commonly known as: PROAMATINE  Take 1 tablet (10 mg total) by mouth 3 (three) times daily with meals.   morphine  30 MG 12 hr tablet Commonly known as: MS CONTIN  Take 1 tablet (30 mg total) by mouth every 8 (eight) hours.   multivitamin tablet Take 1 tablet by mouth daily.   nitrofurantoin  (macrocrystal-monohydrate) 100 MG capsule Commonly known as: Macrobid  Take 1 capsule (100 mg total) by mouth 2 (two) times daily. What changed:  when to take this reasons to take this   omeprazole  20 MG capsule Commonly known as: PRILOSEC Take 1 capsule (20 mg total) by mouth daily.   ondansetron  4 MG tablet Commonly known as: Zofran  Take 1 tablet (4 mg total) by mouth every 8 (eight) hours as needed for nausea or vomiting.   ondansetron  8 MG tablet Commonly known as: ZOFRAN  Take 1 tablet (8 mg total) by mouth every 8 (eight) hours as needed for nausea or vomiting. Start on the third day after chemotherapy.   Truqap  160 MG pack Generic drug: capivasertib  Take 2 tablets (320 mg total) by mouth 2 (two) times daily. Take for 4 days, then hold for 3 days. Repeat every 7 days.   Vitamin D3 10 MCG (400 UNIT) Caps Take by mouth.        Follow-up Information     Diedra Lame, MD In 1 week.   Specialty: Family Medicine Contact information: [address] [facility] [place] [address] [phone]         [facility] Emergency Department at [facility].   Specialty: Emergency Medicine Why: As needed, If symptoms worsen Contact information: [address] [place]  [address] [phone]               Discharge Exam: Jacqueline Deleon   07/04/24 1210 07/05/24 0500  Weight: 91.6 kg 94.4 kg   Cardiac: S1/S2,  RRR, No gallops or rubs. Respiratory: No rales, wheezing, rhonchi or rubs. GI: Soft, nondistended, nontender GU: No hematuria Ext: Has trace left ankle edema.  1+DP/PT pulse bilaterally. Musculoskeletal: No joint deformities, No joint redness or warmth, no limitation of ROM in spin.  Tenderness on the left chest wall improved Skin: No rashes.  Neuro: Alert, oriented X3, cranial nerves II-XII grossly intact, moves all extremities normally.  Psych: Patient is not psychotic, no suicidal or hemocidal ideation.    Condition at discharge: good  The results of significant diagnostics from this hospitalization (including imaging, microbiology, ancillary and laboratory) are listed below for reference.   Imaging Studies: CT ABDOMEN PELVIS W CONTRAST Result Date: 07/04/2024 CLINICAL DATA:  Pain under left breast around to back EXAM: CT ABDOMEN AND PELVIS WITH CONTRAST TECHNIQUE: Multidetector CT imaging of the abdomen and pelvis was performed using the standard protocol following bolus administration of intravenous contrast. RADIATION DOSE REDUCTION: This exam was performed according to the departmental dose-optimization program which includes automated exposure control, adjustment of the mA and/or kV according to patient size and/or use of iterative reconstruction technique. CONTRAST:  OMNIPAQUE  IOHEXOL  350 MG/ML SOLN COMPARISON:  CT 05/15/2024, MRI 05/15/2024, CT 02/08/2024, 11/24/2023 FINDINGS: Lower chest: See separately dictated chest CT Hepatobiliary: Cholecystectomy. Prominence of the common bile duct, presumably due to postsurgical change. Numerous vague hypodense liver lesions, for example left hepatic 11 mm lesion on series 3, image 12, previously 9 mm. Vague right hepatic lobe hypodense lesions measuring up to 9 mm, series 3, image 22. These are more conspicuous compared to the CT from July and correspond to numerous metastatic lesions on interval MRI. Pancreas: Unremarkable. No pancreatic  ductal dilatation or surrounding inflammatory changes. Spleen: Normal in size without focal abnormality. Adrenals/Urinary Tract: Adrenal glands are normal. Kidneys show no hydronephrosis. 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 The bladder is unremarkable Stomach/Bowel: The stomach is within normal limits. No dilated small bowel. Large stool burden. No acute bowel wall thickening Vascular/Lymphatic: No significant vascular findings are present. No enlarged abdominal or pelvic lymph nodes. Reproductive: Hysterectomy.  No adnexal mass Other: No ascites or free air Musculoskeletal: Wide spread lucent and sclerotic skeletal metastatic disease which appears slightly progressive compared with July exam. 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 Stable small 12 mm subcutaneous nodule in the right posterior paraspinal fat, series series 35. IMPRESSION: 1. No CT evidence for acute intra-abdominal or pelvic abnormality. 2. Wide spread lucent and sclerotic skeletal metastatic disease which appears slightly progressive compared with July exam. 3. Numerous vague hypodense liver lesions, more conspicuous compared to the CT from July and correspond to numerous metastatic lesions on interval recent MRI. 4. 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 Electronically Signed   By: Luke Bun M.D.   On: 07/04/2024 17:00   CT Angio Chest PE W and/or Wo Contrast Result Date: 07/04/2024 CLINICAL DATA:  Pain under left breast worse with movement EXAM: CT ANGIOGRAPHY CHEST WITH CONTRAST TECHNIQUE: Multidetector CT imaging of the chest was performed using the standard protocol during bolus administration of intravenous contrast. Multiplanar CT image reconstructions and MIPs were obtained to evaluate the vascular anatomy. RADIATION DOSE  REDUCTION: This exam was performed according to the departmental dose-optimization program which includes automated exposure control, adjustment of the mA and/or kV according to patient size and/or use of iterative reconstruction technique. CONTRAST:  OMNIPAQUE  IOHEXOL  350 MG/ML SOLN COMPARISON:  CT 05/15/2024, PET CT 03/19/2024, 02/08/2024 FINDINGS: Cardiovascular: Satisfactory opacification of the pulmonary arteries to the segmental level. No evidence of pulmonary embolism. Left-sided central venous port with tip in the right atrium. Nonaneurysmal aorta. No dissection. Cardiomegaly. No pericardial effusion Mediastinum/Nodes: Patent trachea. No thyroid  mass. No suspicious mediastinal or hilar nodes. Esophagus within normal limits Lungs/Pleura: No pleural effusion or pneumothorax. Interval patchy mosaic density in the bilateral lungs. Right middle lobe nodular density measuring 6 x 4 mm on series 5, image 64, unchanged, has slightly scar like configuration on coronal images. Upper Abdomen: See separately dictated CT Musculoskeletal: Widespread lucent and sclerotic skeletal metastatic disease  involving the sternum, spine, ribs, scapula, and clavicles. Chronic right third posterolateral rib fracture. Possible subtle acute nondisplaced left anterior 6 rib fracture, series 4, image 84. Review of the MIP images confirms the above findings. IMPRESSION: 1. Negative for acute pulmonary embolus or aortic dissection. 2. Interval development of patchy mosaic density in the bilateral lungs, possible small airways disease. 3. Widespread skeletal metastatic disease as before. Possible subtle acute nondisplaced left anterior sixth rib fracture. 4. Stable 6 x 4 mm right middle lobe nodular density, question scar like configuration on today's study. Continued attention on imaging follow-up. Electronically Signed   By: Luke Bun M.D.   On: 07/04/2024 16:48   CT HEAD WO CONTRAST ( ) Result Date: 07/04/2024 EXAM: CT  HEAD WITHOUT CONTRAST 07/04/2024 04:26:04 PM TECHNIQUE: CT of the head was performed without the administration of intravenous contrast. Automated exposure control, iterative reconstruction, and/or weight based adjustment of the mA/kV was utilized to reduce the radiation dose to as low as reasonably achievable. COMPARISON: Head CT 09/17/2022 and MRI 09/01/2022. CLINICAL HISTORY: Headache, new onset. FINDINGS: BRAIN AND VENTRICLES: There is no evidence of an acute infarct, intracranial hemorrhage, mass, midline shift, hydrocephalus, or extra-axial fluid collection. Cerebral volume is normal. 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 ORBITS: No acute abnormality. SINUSES: Small mucous retention cyst in the left maxillary sinus. Small right mastoid effusion. SOFT TISSUES AND SKULL: No acute soft tissue abnormality. No skull fracture. IMPRESSION: 1. No acute intracranial abnormality. 2. Small chronic left cerebellar infarct. Electronically signed by: Dasie Hamburg MD 07/04/2024 04:32 PM EST RP Workstation: [id number]   US  Venous Img Lower Unilateral Left Result Date: 07/04/2024 EXAM: ULTRASOUND DUPLEX OF THE LEFT LOWER EXTREMITY VEINS TECHNIQUE: Duplex ultrasound using B-mode/gray scaled imaging and Doppler spectral analysis and color flow was obtained of the deep venous structures of the left lower extremity. COMPARISON: US  Left Lower extremity venous 11/28/2020. CLINICAL HISTORY: Left lower extremity swelling for 3 weeks. FINDINGS: The common femoral vein, femoral vein, popliteal vein, and posterior tibial vein demonstrate normal compressibility with normal color flow and spectral analysis. Survey views of the contralateral common femoral vein are unremarkable. IMPRESSION: 1. No evidence of deep venous thrombosis. Electronically signed by: Katheleen Faes MD 07/04/2024 02:38 PM EST RP Workstation: [id number]   DG Chest 2 View Result Date: 07/04/2024 CLINICAL DATA:  left chest pain EXAM: CHEST - 2 VIEW  COMPARISON:  February 16, 2022, May 15, 2024 FINDINGS: The cardiomediastinal silhouette is unchanged in contour.LEFT-sided chest port with tip terminating over the superior cavoatrial junction. No pleural effusion. No pneumothorax. No acute pleuroparenchymal abnormality. Visualized abdomen is unremarkable. Mottled lucency of the RIGHT scapula and thoracic spine consistent with known osseous metastatic disease with better assessed on prior cross-sectional imaging. IMPRESSION: 1. No acute cardiopulmonary abnormality. 2. Known osseous metastatic disease. Electronically Signed   By: Corean Salter M.D.   On: 07/04/2024 13:01   ECHOCARDIOGRAM COMPLETE Result Date: 06/06/2024    ECHOCARDIOGRAM REPORT   Patient Name:   Jacqueline Deleon Date of Exam: 06/06/2024 Medical Rec #:  [id number]      Height:       63.0 in Accession #:    [id number]     Weight:       208.0 lb Date of Birth:  [birth year]      BSA:          1.966 m Patient Age:    63 years       BP:  122/81 mmHg Patient Gender: F              HR:           86 bpm. Exam Location:  [facility] Procedure: 2D Echo, Cardiac Doppler, Color Doppler and Strain Analysis (Both            Spectral and Color Flow Doppler were utilized during procedure). Indications:     Chemo Z09  History:         Patient has prior history of Echocardiogram examinations, most                  recent 02/08/2023. Signs/Symptoms:Murmur.  Sonographer:     Christopher Furnace Referring Phys:  [id number] ANNAH BROCKS RAO Diagnosing Phys: Evalene Lunger MD  Sonographer Comments: Global longitudinal strain was attempted. IMPRESSIONS  1. Left ventricular ejection fraction, by estimation, is 60 to 65%. The left ventricle has normal function. The left ventricle has no regional wall motion abnormalities. Left ventricular diastolic parameters are consistent with Grade I diastolic dysfunction (impaired relaxation). The average left ventricular global longitudinal strain is -18.0 %. The global longitudinal strain is  normal.  2. Right ventricular systolic function is normal. The right ventricular size is normal.  3. The mitral valve is normal in structure. No evidence of mitral valve regurgitation. No evidence of mitral stenosis.  4. The aortic valve is normal in structure. Aortic valve regurgitation is not visualized. No aortic stenosis is present.  5. The inferior vena cava is normal in size with greater than 50% respiratory variability, suggesting right atrial pressure of 3 mmHg. FINDINGS  Left Ventricle: Left ventricular ejection fraction, by estimation, is 60 to 65%. The left ventricle has normal function. The left ventricle has no regional wall motion abnormalities. The average left ventricular global longitudinal strain is -18.0 %. Strain was performed and the global longitudinal strain is normal. The left ventricular internal cavity size was normal in size. There is no left ventricular hypertrophy. Left ventricular diastolic parameters are consistent with Grade I diastolic dysfunction (impaired relaxation). Right Ventricle: The right ventricular size is normal. No increase in right ventricular wall thickness. Right ventricular systolic function is normal. Left Atrium: Left atrial size was normal in size. Right Atrium: Right atrial size was normal in size. Pericardium: There is no evidence of pericardial effusion. Mitral Valve: The mitral valve is normal in structure. No evidence of mitral valve regurgitation. No evidence of mitral valve stenosis. MV peak gradient, 4.2 mmHg. The mean mitral valve gradient is 2.0 mmHg. Tricuspid Valve: The tricuspid valve is normal in structure. Tricuspid valve regurgitation is not demonstrated. No evidence of tricuspid stenosis. Aortic Valve: The aortic valve is normal in structure. Aortic valve regurgitation is not visualized. No aortic stenosis is present. Aortic valve mean gradient measures 6.0 mmHg. Aortic valve peak gradient measures 9.7 mmHg. Aortic valve area, by VTI measures 2.48  cm. Pulmonic Valve: The pulmonic valve was normal in structure. Pulmonic valve regurgitation is not visualized. No evidence of pulmonic stenosis. Aorta: The aortic root is normal in size and structure. Venous: The inferior vena cava is normal in size with greater than 50% respiratory variability, suggesting right atrial pressure of 3 mmHg. IAS/Shunts: No atrial level shunt detected by color flow Doppler. Additional Comments: 3D was performed not requiring image post processing on an independent workstation and was indeterminate.  LEFT VENTRICLE PLAX 2D LVIDd:         3.40 cm   Diastology LVIDs:  2.40 cm   LV e' medial:    7.18 cm/s LV PW:         1.10 cm   LV E/e' medial:  10.1 LV IVS:        1.30 cm   LV e' lateral:   8.38 cm/s LVOT diam:     2.00 cm   LV E/e' lateral: 8.6 LV SV:         68 LV SV Index:   35        2D Longitudinal Strain LVOT Area:     3.14 cm  2D Strain GLS Avg:     -18.0 % LV IVRT:       108 msec  RIGHT VENTRICLE RV Basal diam:  2.90 cm     PULMONARY VEINS RV Mid diam:    2.00 cm     Diastolic Velocity: 42.60 cm/s RV S prime:     16.00 cm/s  S/D Velocity:       0.60 TAPSE (M-mode): 2.8 cm      Systolic Velocity:  23.70 cm/s LEFT ATRIUM             Index        RIGHT ATRIUM          Index LA diam:        2.20 cm 1.12 cm/m   RA Area:     8.42 cm LA Vol (A2C):   26.0 ml 13.22 ml/m  RA Volume:   13.70 ml 6.97 ml/m LA Vol (A4C):   19.2 ml 9.77 ml/m LA Biplane Vol: 22.3 ml 11.34 ml/m  AORTIC VALVE AV Area (Vmax):    2.76 cm AV Area (Vmean):   3.03 cm AV Area (VTI):     2.48 cm AV Vmax:           156.00 cm/s AV Vmean:          114.000 cm/s AV VTI:            0.275 m AV Peak Grad:      9.7 mmHg AV Mean Grad:      6.0 mmHg LVOT Vmax:         137.00 cm/s LVOT Vmean:        110.000 cm/s LVOT VTI:          0.217 m LVOT/AV VTI ratio: 0.79  AORTA Ao Root diam: 2.70 cm MITRAL VALVE                TRICUSPID VALVE MV Area (PHT): 4.10 cm     TR Peak grad:   9.2 mmHg MV Area VTI:   3.36 cm      TR Vmax:        152.00 cm/s MV Peak grad:  4.2 mmHg MV Mean grad:  2.0 mmHg     SHUNTS MV Vmax:       1.02 m/s     Systemic VTI:  0.22 m MV Vmean:      69.6 cm/s    Systemic Diam: 2.00 cm MV Decel Time: 185 msec MV E velocity: 72.30 cm/s MV A velocity: 104.00 cm/s MV E/A ratio:  0.70 Evalene Lunger MD Electronically signed by Evalene Lunger MD Signature Date/Time: 06/06/2024/2:02:23 PM    Final     Microbiology: Results for orders placed or performed during the hospital encounter of 07/04/24  Resp panel by RT-PCR (RSV, Flu A&B, Covid) Anterior Nasal Swab     Status: None   Collection Time: 07/04/24  1:03 PM   Specimen: Anterior Nasal Swab  Result Value Ref Range Status   SARS Coronavirus 2 by RT PCR NEGATIVE NEGATIVE Final    Comment: (NOTE) SARS-CoV-2 target nucleic acids are NOT DETECTED.  The SARS-CoV-2 RNA is generally detectable in upper respiratory specimens during the acute phase of infection. The lowest concentration of SARS-CoV-2 viral copies this assay can detect is 138 copies/mL. A negative result does not preclude SARS-Cov-2 infection and should not be used as the sole basis for treatment or other patient management decisions. A negative result may occur with  improper specimen collection/handling, submission of specimen other than nasopharyngeal swab, presence of viral mutation(s) within the areas targeted by this assay, and inadequate number of viral copies(<138 copies/mL). A negative result must be combined with clinical observations, patient history, and epidemiological information. The expected result is Negative.  Fact Sheet for Patients:  bloggercourse.com  Fact Sheet for Healthcare Providers:  seriousbroker.it  This test is no t yet approved or cleared by the [place]  FDA and  has been authorized for detection and/or diagnosis of SARS-CoV-2 by FDA under an Emergency Use Authorization (EUA). This EUA will  remain  in effect (meaning this test can be used) for the duration of the COVID-19 declaration under Section 564(b)(1) of the Act, 21 U.S.C.section 360bbb-3(b)(1), unless the authorization is terminated  or revoked sooner.       Influenza A by PCR NEGATIVE NEGATIVE Final   Influenza B by PCR NEGATIVE NEGATIVE Final    Comment: (NOTE) The Xpert Xpress SARS-CoV-2/FLU/RSV plus assay is intended as an aid in the diagnosis of influenza from Nasopharyngeal swab specimens and should not be used as a sole basis for treatment. Nasal washings and aspirates are unacceptable for Xpert Xpress SARS-CoV-2/FLU/RSV testing.  Fact Sheet for Patients: bloggercourse.com  Fact Sheet for Healthcare Providers: seriousbroker.it  This test is not yet approved or cleared by the [place]  FDA and has been authorized for detection and/or diagnosis of SARS-CoV-2 by FDA under an Emergency Use Authorization (EUA). This EUA will remain in effect (meaning this test can be used) for the duration of the COVID-19 declaration under Section 564(b)(1) of the Act, 21 U.S.C. section 360bbb-3(b)(1), unless the authorization is terminated or revoked.     Resp Syncytial Virus by PCR NEGATIVE NEGATIVE Final    Comment: (NOTE) Fact Sheet for Patients: bloggercourse.com  Fact Sheet for Healthcare Providers: seriousbroker.it  This test is not yet approved or cleared by the [place]  FDA and has been authorized for detection and/or diagnosis of SARS-CoV-2 by FDA under an Emergency Use Authorization (EUA). This EUA will remain in effect (meaning this test can be used) for the duration of the COVID-19 declaration under Section 564(b)(1) of the Act, 21 U.S.C. section 360bbb-3(b)(1), unless the authorization is terminated or revoked.  Performed at [facility], [address] [place], [place] [address]    Culture, blood (Routine X 2) w Reflex to ID Panel     Status: None (Preliminary result)   Collection Time: 07/04/24 10:28 PM   Specimen: BLOOD  Result Value Ref Range Status   Specimen Description BLOOD BLOOD LEFT HAND  Final   Special Requests   Final    BOTTLES DRAWN AEROBIC AND ANAEROBIC Blood Culture adequate volume   Culture   Final    NO GROWTH < 12 HOURS Performed at [facility], [address] [place], [place] [address]    Report Status PENDING  Incomplete  Culture, blood (Routine X  2) w Reflex to ID Panel     Status: None (Preliminary result)   Collection Time: 07/04/24 10:38 PM   Specimen: BLOOD  Result Value Ref Range Status   Specimen Description BLOOD BLOOD LEFT WRIST  Final   Special Requests   Final    BOTTLES DRAWN AEROBIC AND ANAEROBIC Blood Culture adequate volume   Culture   Final    NO GROWTH < 12 HOURS Performed at [facility], [address], [place], [place] [id number]    Report Status PENDING  Incomplete   *Note: Due to a large number of results and/or encounters for the requested time period, some results have not been displayed. A complete set of results can be found in Results Review.    Labs: CBC: Recent Labs  Lab 07/02/24 1239 07/04/24 1303 07/04/24 2228 07/05/24 0402  WBC 3.1* 2.5* 2.1* 2.3*  NEUTROABS 1.5*  --  1.1* 1.1*  HGB 9.4* 8.7* 8.3* 8.6*  HCT 31.2* 29.6* 28.3* 29.6*  MCV 84.8 87.3 87.6 87.3  PLT 271 223 218 225   Basic Metabolic Panel: Recent Labs  Lab 07/02/24 1239 07/04/24 1303 07/05/24 0402  NA 140 142 141  K 4.5 4.8 4.6  CL 106 106 109  CO2 24 25 23   GLUCOSE 99 111* 88  BUN 12 14 10   CREATININE 0.99 0.92 0.85  CALCIUM  8.5* 7.5* 6.9*   Liver Function Tests: Recent Labs  Lab 07/02/24 1239 07/04/24 1303  AST 28 27  ALT 23 20  ALKPHOS 219* 190*  BILITOT 0.2 0.2  PROT 5.7* 5.3*  ALBUMIN 3.4* 3.1*   CBG: Recent Labs  Lab 07/04/24 2258 07/05/24 0847 07/05/24 1124  GLUCAP 114* 95 94     Discharge time spent:  37 minutes.  Signed: Drue ONEIDA Potter, MD [facility] 07/05/2024 "

## 2024-07-06 ENCOUNTER — Other Ambulatory Visit: Payer: Self-pay

## 2024-07-09 ENCOUNTER — Encounter: Payer: Self-pay | Admitting: Oncology

## 2024-07-09 LAB — CULTURE, BLOOD (ROUTINE X 2)
Culture: NO GROWTH
Culture: NO GROWTH
Special Requests: ADEQUATE
Special Requests: ADEQUATE

## 2024-07-10 ENCOUNTER — Ambulatory Visit: Payer: Self-pay | Admitting: Podiatry

## 2024-07-10 ENCOUNTER — Encounter: Payer: Self-pay | Admitting: Oncology

## 2024-07-10 ENCOUNTER — Encounter: Payer: Self-pay | Admitting: Podiatry

## 2024-07-10 DIAGNOSIS — M7672 Peroneal tendinitis, left leg: Secondary | ICD-10-CM | POA: Diagnosis not present

## 2024-07-10 NOTE — Progress Notes (Signed)
 "  Subjective:  Patient ID: Jacqueline Deleon, female    DOB: 1960-11-22,  MRN: [id number]  Chief Complaint  Patient presents with   Foot Pain    Pt stated that she has been having some discomfort in her left foot along the outside of her foot she denies any recent injuries     63 y.o. female presents with the above complaint.  Patient presents with left lateral foot pain that just came out of nowhere has been off for few months has had no denies any acute injuries would like to discuss treatment options for this.  Pain scale is 7 out of 10 dull achy in nature.  She states is very similar to the other side.  Denies any other acute complaints would like to discuss treatment options for this   Review of Systems: Negative except as noted in the HPI. Denies N/V/F/Ch.  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   Chicken pox    Depression    Fatty liver    Fatty liver    Gastric polyps    GERD (gastroesophageal reflux disease)    Gestational diabetes    prediabetes out side of having baby   Headache    migraines   Heart murmur    History of abnormal mammogram 08/2006, 01/2008, 03/2009   Hypertension    IBS (irritable bowel syndrome)    Joint disorder    hx of left ankle tendonitis, bursitis, heel spur   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 There is no height or weight on file to calculate BMI. Constitutional Well developed. Well nourished.  Vascular Dorsalis pedis pulses palpable bilaterally. Posterior tibial pulses palpable bilaterally. Capillary refill normal to all digits.  No cyanosis or clubbing noted. Pedal hair growth normal.  Neurologic Normal speech. Oriented to person, place, and time. Epicritic sensation to light touch grossly present bilaterally.  Dermatologic Nails well groomed and normal in appearance. No open wounds. No skin lesions.  Orthopedic: Pain along the course of the peroneal tendon pain with resisted eversion of the foot.  No pain with plantarflexion eversion of the foot.  Pain along the course of the peroneal tendon pain at the insertion.   Radiographs: None Assessment:   1. Peroneal tendinitis of left lower extremity    Plan:  Patient was evaluated and treated and all questions answered.  Left peroneal tendinitis - All questions and concerns were discussed with the patient in extensive detail given the presence of pain that she is experiencing should benefit from [place] immobilization Cam boot was dispensed.  If there is no improvement discussed third injection versus MRI during next visit.  Patient agrees with the plan   No follow-ups on file.     [  1]  Current Outpatient Medications:    aspirin EC 81 MG tablet, Take by mouth., Disp: , Rfl:    b complex vitamins capsule, Take 1 capsule by mouth daily., Disp: , Rfl:    Blood Glucose Monitoring Suppl (BLOOD GLUCOSE MONITOR SYSTEM) w/Device KIT, Use to test blood sugars in the morning, at noon, and at bedtime., Disp: 1 kit, Rfl: 0   buPROPion  (WELLBUTRIN  XL) 150 MG 24 hr tablet, Take 1 tablet (150 mg total) by mouth daily. Take along a 300mg  tablet for a total daily dose of 450mg ., Disp: 90 tablet, Rfl: 3   buPROPion  (WELLBUTRIN  XL) 300 MG 24 hr tablet, Take 1 tablet (300 mg total) by mouth daily., Disp: 90 tablet, Rfl: 3    busPIRone  (BUSPAR ) 10 MG tablet, Take 1 tablet (10 mg total) by mouth 2 (two) times daily, Disp: 180 tablet, Rfl: 1   calcium  carbonate (OSCAL) 1500 (600 Ca) MG TABS tablet, Take by mouth 2 (two) times daily with a meal., Disp: , Rfl:    capivasertib  (TRUQAP ) 160 MG pack, Take 2 tablets (320 mg total) by mouth 2 (two) times daily. Take for 4 days, then hold for 3 days. Repeat every 7 days., Disp: 64 tablet, Rfl: 0   Cholecalciferol  (VITAMIN D3) 10 MCG (400 UNIT) CAPS, Take by mouth., Disp: , Rfl:    citalopram  (CELEXA ) 40 MG tablet, Take 1 tablet (40 mg total) by mouth daily., Disp: 90 tablet, Rfl: 3   clotrimazole  (LOTRIMIN ) 1 % cream, Apply topically 2 (two) times daily., Disp: , Rfl:    diphenhydrAMINE  (BENADRYL ) 25 MG tablet, Take 25 mg by mouth at bedtime as needed for sleep. Pt states daily now for sleep and allergies., Disp: , Rfl:    diphenoxylate -atropine  (LOMOTIL ) 2.5-0.025 MG tablet, Take 2 tablets by mouth 4 (four) times daily as needed for diarrhea or loose stools., Disp: 60 tablet, Rfl: 0   gabapentin  (NEURONTIN ) 100 MG capsule, Take 1 capsule (100 mg total)  By mouth every morning , THEN 2 capsules (200 mg total) every evening., Disp: 270 capsule, Rfl: 0   HYDROmorphone  (DILAUDID ) 2 MG tablet, Take 2 tablets (4 mg total) by mouth every 6 (six) hours as needed for severe pain (pain score 7-10)., Disp: 120 tablet, Rfl: 0   ketotifen  (ZADITOR ) 0.025 % ophthalmic solution, Place 1 drop into both eyes daily. (Patient taking differently: Place 1 drop into both eyes as needed.), Disp: , Rfl:    lidocaine -prilocaine  (EMLA ) cream, Apply to affected area once as directed., Disp: 30 g, Rfl: 3   Magnesium  Glycinate 100 MG CAPS, , Disp: , Rfl:    metFORMIN  (GLUCOPHAGE -XR) 500 MG 24 hr tablet, Take 1 tablet (500 mg total) by mouth 2 (two) times daily with a meal., Disp: 180 tablet, Rfl: 1   midodrine  (PROAMATINE ) 10 MG tablet, Take 1 tablet (10 mg total) by mouth 3 (three) times daily with meals.,  Disp: 90 tablet, Rfl: 0   morphine  (MS CONTIN ) 30 MG 12 hr tablet, Take 1 tablet (30 mg total) by mouth every 8 (eight) hours., Disp: 90 tablet, Rfl: 0   Multiple Vitamin (MULTIVITAMIN) tablet, Take 1 tablet by mouth daily., Disp: , Rfl:    nitrofurantoin , macrocrystal-monohydrate, (MACROBID ) 100 MG capsule, Take 1 capsule (100 mg total) by mouth 2 (two) times daily. (Patient taking differently: Take 100 mg by mouth as needed.), Disp: 14 capsule, Rfl: 0   omeprazole  (PRILOSEC) 20 MG capsule, Take 1 capsule (20 mg total) by mouth daily., Disp:  90 capsule, Rfl: 3   ondansetron  (ZOFRAN ) 4 MG tablet, Take 1 tablet (4 mg total) by mouth every 8 (eight) hours as needed for nausea or vomiting., Disp: 90 tablet, Rfl: 0   ondansetron  (ZOFRAN ) 8 MG tablet, Take 1 tablet (8 mg total) by mouth every 8 (eight) hours as needed for nausea or vomiting. Start on the third day after chemotherapy., Disp: 30 tablet, Rfl: 1 [2]  Social History Tobacco Use  Smoking Status Never  Smokeless Tobacco Never  [3]  Allergies Allergen Reactions   Kiwi Extract Itching and Swelling    The real kiwi fruit   Codeine Other (See Comments)    Felt funny.   Food Itching and Swelling    grapes   Amoxicillin Rash   Erythromycin Rash   Sulfa  Antibiotics Rash   Tape Rash    Adhesives  Pt can have paper tape   "

## 2024-07-11 ENCOUNTER — Ambulatory Visit: Payer: Self-pay | Admitting: Podiatry

## 2024-07-13 ENCOUNTER — Encounter: Payer: Self-pay | Admitting: Oncology

## 2024-07-16 NOTE — Telephone Encounter (Signed)
 Do we need send prescription for her to get a wig?

## 2024-07-18 ENCOUNTER — Encounter: Payer: Self-pay | Admitting: Oncology

## 2024-07-18 MED ORDER — MORPHINE SULFATE ER 30 MG PO TBCR: 30.0000 mg | EXTENDED_RELEASE_TABLET | Freq: Three times a day (TID) | ORAL | 0 refills | Status: AC

## 2024-07-18 NOTE — Telephone Encounter (Addendum)
 Outbound call to patient, informed after speaking with Dufm Daring the letter of medical necessity for wigs is not necessary within the cancer center, only outside facilities providing hair prosthesis.  Informed patient I would mail letter to her in case she decides to visit an outside facility for this. Also provided telephone number to Maire Daring [phone] in case she has any questions for her including hours of operation since they're dependent on volunteers being present.  Patient requested a refill of morphine , forwarded to Dr. Melanee for review.

## 2024-07-20 ENCOUNTER — Telehealth: Payer: Self-pay | Admitting: *Deleted

## 2024-07-20 MED FILL — Fosaprepitant Dimeglumine For IV Infusion 150 MG (Base Eq): INTRAVENOUS | Qty: 5 | Status: AC

## 2024-07-20 NOTE — Telephone Encounter (Signed)
 [employer] disability form completed, signed by provider and faxed for patient. Fax confirmation [email or web address]

## 2024-07-23 ENCOUNTER — Other Ambulatory Visit: Payer: Self-pay

## 2024-07-23 ENCOUNTER — Encounter: Payer: Self-pay | Admitting: Oncology

## 2024-07-23 ENCOUNTER — Inpatient Hospital Stay: Attending: Oncology

## 2024-07-23 ENCOUNTER — Inpatient Hospital Stay: Attending: Oncology | Admitting: Oncology

## 2024-07-23 ENCOUNTER — Inpatient Hospital Stay

## 2024-07-23 VITALS — BP 122/67 | HR 73 | Temp 98.9°F | Resp 17 | Wt 201.1 lb

## 2024-07-23 VITALS — BP 105/57 | HR 70

## 2024-07-23 DIAGNOSIS — Z853 Personal history of malignant neoplasm of breast: Secondary | ICD-10-CM | POA: Diagnosis not present

## 2024-07-23 DIAGNOSIS — C7951 Secondary malignant neoplasm of bone: Secondary | ICD-10-CM | POA: Diagnosis present

## 2024-07-23 DIAGNOSIS — G893 Neoplasm related pain (acute) (chronic): Secondary | ICD-10-CM | POA: Diagnosis not present

## 2024-07-23 DIAGNOSIS — C50919 Malignant neoplasm of unspecified site of unspecified female breast: Secondary | ICD-10-CM

## 2024-07-23 DIAGNOSIS — Z5112 Encounter for antineoplastic immunotherapy: Secondary | ICD-10-CM | POA: Insufficient documentation

## 2024-07-23 DIAGNOSIS — C787 Secondary malignant neoplasm of liver and intrahepatic bile duct: Secondary | ICD-10-CM | POA: Diagnosis not present

## 2024-07-23 LAB — CBC WITH DIFFERENTIAL (CANCER CENTER ONLY)
Abs Immature Granulocytes: 0.01 K/uL (ref 0.00–0.07)
Basophils Absolute: 0 K/uL (ref 0.0–0.1)
Basophils Relative: 1 %
Eosinophils Absolute: 0.5 K/uL (ref 0.0–0.5)
Eosinophils Relative: 11 %
HCT: 34.5 % — ABNORMAL LOW (ref 36.0–46.0)
Hemoglobin: 10.4 g/dL — ABNORMAL LOW (ref 12.0–15.0)
Immature Granulocytes: 0 %
Lymphocytes Relative: 23 %
Lymphs Abs: 1 K/uL (ref 0.7–4.0)
MCH: 27.1 pg (ref 26.0–34.0)
MCHC: 30.1 g/dL (ref 30.0–36.0)
MCV: 89.8 fL (ref 80.0–100.0)
Monocytes Absolute: 0.6 K/uL (ref 0.1–1.0)
Monocytes Relative: 14 %
Neutro Abs: 2.2 K/uL (ref 1.7–7.7)
Neutrophils Relative %: 51 %
Platelet Count: 263 K/uL (ref 150–400)
RBC: 3.84 MIL/uL — ABNORMAL LOW (ref 3.87–5.11)
RDW: 26.5 % — ABNORMAL HIGH (ref 11.5–15.5)
WBC Count: 4.2 K/uL (ref 4.0–10.5)
nRBC: 0 % (ref 0.0–0.2)

## 2024-07-23 LAB — CMP (CANCER CENTER ONLY)
ALT: 15 U/L (ref 0–44)
AST: 31 U/L (ref 15–41)
Albumin: 3.9 g/dL (ref 3.5–5.0)
Alkaline Phosphatase: 187 U/L — ABNORMAL HIGH (ref 38–126)
Anion gap: 9 (ref 5–15)
BUN: 12 mg/dL (ref 8–23)
CO2: 23 mmol/L (ref 22–32)
Calcium: 9 mg/dL (ref 8.9–10.3)
Chloride: 105 mmol/L (ref 98–111)
Creatinine: 0.93 mg/dL (ref 0.44–1.00)
GFR, Estimated: >60 mL/min
Glucose, Bld: 75 mg/dL (ref 70–99)
Potassium: 4.8 mmol/L (ref 3.5–5.1)
Sodium: 137 mmol/L (ref 135–145)
Total Bilirubin: 0.3 mg/dL (ref 0.0–1.2)
Total Protein: 6.4 g/dL — ABNORMAL LOW (ref 6.5–8.1)

## 2024-07-23 MED ORDER — FAM-TRASTUZUMAB DERUXTECAN-NXKI CHEMO 100 MG IV SOLR
5.4000 mg/kg | Freq: Once | INTRAVENOUS | Status: AC
  Administered 2024-07-23: 500 mg via INTRAVENOUS
  Filled 2024-07-23: qty 25

## 2024-07-23 MED ORDER — DIPHENHYDRAMINE HCL 25 MG PO TABS
50.0000 mg | ORAL_TABLET | Freq: Once | ORAL | Status: AC
  Administered 2024-07-23: 50 mg via ORAL
  Filled 2024-07-23: qty 2

## 2024-07-23 MED ORDER — PALONOSETRON HCL INJECTION 0.25 MG/5ML
0.2500 mg | Freq: Once | INTRAVENOUS | Status: AC
  Administered 2024-07-23: 0.25 mg via INTRAVENOUS
  Filled 2024-07-23: qty 5

## 2024-07-23 MED ORDER — ACETAMINOPHEN 325 MG PO TABS
650.0000 mg | ORAL_TABLET | Freq: Once | ORAL | Status: AC
  Administered 2024-07-23: 650 mg via ORAL
  Filled 2024-07-23: qty 2

## 2024-07-23 MED ORDER — DEXTROSE 5 % IV SOLN
INTRAVENOUS | Status: DC
  Filled 2024-07-23: qty 250

## 2024-07-23 MED ORDER — PROCHLORPERAZINE EDISYLATE 10 MG/2ML IJ SOLN
10.0000 mg | Freq: Once | INTRAMUSCULAR | Status: AC
  Administered 2024-07-23: 10 mg via INTRAVENOUS
  Filled 2024-07-23: qty 2

## 2024-07-23 MED ORDER — SODIUM CHLORIDE 0.9 % IV SOLN
150.0000 mg | Freq: Once | INTRAVENOUS | Status: AC
  Administered 2024-07-23: 150 mg via INTRAVENOUS
  Filled 2024-07-23: qty 150

## 2024-07-23 NOTE — Progress Notes (Signed)
 Patient here for oncology follow-up appointment, expresses no new concerns at this time.

## 2024-07-23 NOTE — Patient Instructions (Signed)
 [facility] - A DEPT OF [facility]  Discharge Instructions: Thank you for choosing [facility] to provide your oncology and hematology care.  If you have a lab appointment with the Cancer Center, please go directly to the Cancer Center and check in at the registration area.  Wear comfortable clothing and clothing appropriate for easy access to any Portacath or PICC line.   We strive to give you quality time with your provider. You may need to reschedule your appointment if you arrive late (15 or more minutes).  Arriving late affects you and other patients whose appointments are after yours.  Also, if you miss three or more appointments without notifying the office, you may be dismissed from the clinic at the provider's discretion.      For prescription refill requests, have your pharmacy contact our office and allow 72 hours for refills to be completed.    Today you received the following chemotherapy and/or immunotherapy agents:trastuzumab     To help prevent nausea and vomiting after your treatment, we encourage you to take your nausea medication as directed.  BELOW ARE SYMPTOMS THAT SHOULD BE REPORTED IMMEDIATELY: *FEVER GREATER THAN 100.4 F (38 C) OR HIGHER *CHILLS OR SWEATING *NAUSEA AND VOMITING THAT IS NOT CONTROLLED WITH YOUR NAUSEA MEDICATION *UNUSUAL SHORTNESS OF BREATH *UNUSUAL BRUISING OR BLEEDING *URINARY PROBLEMS (pain or burning when urinating, or frequent urination) *BOWEL PROBLEMS (unusual diarrhea, constipation, pain near the anus) TENDERNESS IN MOUTH AND THROAT WITH OR WITHOUT PRESENCE OF ULCERS (sore throat, sores in mouth, or a toothache) UNUSUAL RASH, SWELLING OR PAIN  UNUSUAL VAGINAL DISCHARGE OR ITCHING   Items with * indicate a potential emergency and should be followed up as soon as possible or go to the Emergency Department if any problems should occur.  Please show the CHEMOTHERAPY ALERT CARD or IMMUNOTHERAPY  ALERT CARD at check-in to the Emergency Department and triage nurse.  Should you have questions after your visit or need to cancel or reschedule your appointment, please contact [facility] - A DEPT OF [facility]  [phone] and follow the prompts.  Office hours are 8:00 a.m. to 4:30 p.m. Monday - Friday. Please note that voicemails left after 4:00 p.m. may not be returned until the following business day.  We are closed weekends and major holidays. You have access to a nurse at all times for urgent questions. Please call the main number to the clinic [phone] and follow the prompts.  For any non-urgent questions, you may also contact your provider using MyChart. We now offer e-Visits for anyone 26 and older to request care online for non-urgent symptoms. For details visit [email or web address].   Also download the MyChart app! Go to the app store, search "MyChart", open the app, select [place], and log in with your MyChart username and password.

## 2024-07-23 NOTE — Progress Notes (Signed)
 "    Hematology/Oncology Consult note [facility]  [phone] [phone]  Patient Care Team: Diedra Lame, MD as PCP - General (Family Medicine) Melanee Annah BROCKS, MD as Consulting Physician (Oncology)   Name of the patient: Jacqueline Deleon  [id number]  09/13/1960   Date of visit: 07/23/2024  Diagnosis-metastatic breast cancer with bone and liver metastases  Chief complaint/ Reason for visit-routine follow-up of breast cancer  Heme/Onc history:  Patient is a 64 year old female with a past medical history significant for stage I right breast cancer diagnosed in 2009.  It was a 1.4 cm tumor with negative lymph nodes grade 1 and patient went on to get lumpectomy followed by adjuvant radiation therapy.  Oncotype DX score was 6 and she did not require adjuvant chemotherapy.  She was on tamoxifen for 5 years.  She had BRCA testing done at that time which was negative.   Patient felt a lump in her right axilla in September 2021.  She underwent ultrasound of bilateral breasts which did not pick up any axillary mass.  A prior screening mammogram in September 2021 was unremarkable.  She was then admitted for Covid pneumonia and underwent CT angio chest on 07/22/2020 which incidentally picked up an axillary mass measuring 2.5 cm.  No obvious breast mass.  Axillary mass was biopsied and was consistent with high-grade invasive mammary carcinoma.  IHC was positive for GATA3 with diffuse strong nuclear staining.  There was no lymph node architecture identified in the sample and it could not be determined if it is a primary tumor within the axillary breast tissue or metastases.  ER strongly positive greater than 90%, PR 1 to 10% positive and HER-2 negative- 0   PET CT scan showed hypermetabolic poorly marginated solid right axillary mass 2.5 x 2 cm with an SUV of 5.6.  No enlarged hypermetabolic mediastinal or hilar lymph nodes no enlarged left axillary lymph nodes.   Subcentimeter lung nodules below PET resolution.  Small hypermetabolism in the right liver with an SUV of 6 without CT correlate equivocal for liver metastases.  Multiple hypermetabolic faintly lytic osseous lesions throughout the lumbar spine and bilateral pelvic girdle with representative supra-acetabular right iliac bone 1.9 cm lesion, anterior superior left acetabular 1.7 cm lesion and L1 1.2 cm lesion.   NGS testing showed no actionable mutations.  PD-L1 less than 1%.  CHEK2 S422 FS, PIK3CA N1068 FS, PIK3CA N345H, CCN E1 gain, ERB B2 1 P-CSF 1R fusion, FGF 19 gain, FGF 3 gain, FGF 4gain.   Ibrance  plus letrozole  started in February 2022.    Patient noted to have increase in CA 27-29 from 26 and November 2023 203 in July 2024.  CT chest abdomen pelvis with contrast and bone scan showed worsening metastatic disease in the bones especially in the thoracolumbar spine and right hemipelvis.      Peripheral blood NGS testing showed CHEK2 mutation, PIK3CA P.N1068 FS, PIK3CA P.N345H, PTEN P.R173S, ESR 1P.D538G, ESR 1P.L536R, PIK3CA P.R88Q.  MSI high not detected  Elacestrant  started in sept 2024.  Disease progression noted in June 2025 and given the presence of PIK3CA mutation patient switched capivasertib  plus fulvestrant    Disease progression noted in November 2025 based on increase in the size of liver lesions.  Repeat liver biopsy confirmed ER 30%, PR negative breast cancer.  Her tumor was not reported to be -0 by our pathology but was noted to be +2 on [employer] testing.  Plan is therefore to proceed with Enhertu  first  before considering systemic chemotherapy     Interval history-patient was in the ER around Christmas 2025 with symptoms of left chest wall pain.  She was treated symptomatically and was discharged.  Her blood pressure was found to be low at that time and she has been on midodrine  since then.  Pain is presently well-controlled with morphine  30 mg every every 8 hours and as needed  Dilaudid .  ECOG PS- 2 Pain scale- 3 Opioid associated constipation- no  Review of systems- Review of Systems  Constitutional:  Positive for malaise/fatigue. Negative for chills, fever and weight loss.  HENT:  Negative for congestion, ear discharge and nosebleeds.   Eyes:  Negative for blurred vision.  Respiratory:  Negative for cough, hemoptysis, sputum production, shortness of breath and wheezing.   Cardiovascular:  Negative for chest pain, palpitations, orthopnea and claudication.  Gastrointestinal:  Negative for abdominal pain, blood in stool, constipation, diarrhea, heartburn, melena, nausea and vomiting.  Genitourinary:  Negative for dysuria, flank pain, frequency, hematuria and urgency.  Musculoskeletal:  Positive for joint pain. Negative for back pain and myalgias.  Skin:  Negative for rash.  Neurological:  Negative for dizziness, tingling, focal weakness, seizures, weakness and headaches.  Endo/Heme/Allergies:  Does not bruise/bleed easily.  Psychiatric/Behavioral:  Negative for depression and suicidal ideas. The patient does not have insomnia.       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   Chicken pox    Depression    Fatty liver    Fatty liver    Gastric polyps    GERD (gastroesophageal reflux disease)    Gestational diabetes    prediabetes out side of having baby   Headache    migraines   Heart murmur    History of abnormal mammogram 08/2006, 01/2008, 03/2009   Hypertension    IBS (irritable bowel syndrome)    Joint disorder    hx of left ankle tendonitis, bursitis,  heel spur   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 Smith/ laprascopic surgery   COLONOSCOPY  04/04/2000   COLONOSCOPY WITH PROPOFOL  N/A 02/12/2019   Procedure: COLONOSCOPY WITH PROPOFOL ;  Surgeon: Gaylyn Gladis PENNER, MD;  Location: [facility] ENDOSCOPY;  Service: Endoscopy;  Laterality: N/A;   ESOPHAGOGASTRODUODENOSCOPY (EGD) WITH PROPOFOL  N/A 05/01/2015  Procedure: ESOPHAGOGASTRODUODENOSCOPY (EGD) WITH PROPOFOL ;  Surgeon: Deward CINDERELLA Piedmont, MD;  Location: [facility] ENDOSCOPY;  Service: Gastroenterology;  Laterality: N/A;   ESOPHAGOGASTRODUODENOSCOPY (EGD) WITH PROPOFOL  N/A 02/12/2019   Procedure: ESOPHAGOGASTRODUODENOSCOPY (EGD) WITH PROPOFOL ;  Surgeon: Gaylyn Gladis PENNER, MD;  Location: [facility] ENDOSCOPY;  Service: Endoscopy;  Laterality: N/A;   EYE SURGERY  08/2012   laser surgery on retina/ DR Appenzeller   FINGER SURGERY     repair of tendon in right index, Dr. Nancyann in [facility] SURGERY     Dr. Verta   IR IMAGING GUIDED PORT INSERTION  05/31/2024   IRRIGATION AND DEBRIDEMENT SEBACEOUS CYST     right upper back   LAPAROSCOPIC SUPRACERVICAL HYSTERECTOMY  06/2007   Dr Kincius/ adhesions/CPP/adenomyosis   MASTECTOMY PARTIAL / LUMPECTOMY Right 01/2008   with sentinal lymph node biopsy/ infiltrating ductal cancer   REPAIR PERONEAL TENDONS ANKLE  10/2019   with  tarsal exostectomy and sural neuroma excision on right    SKIN CANCER EXCISION     back and calves   WISDOM TOOTH EXTRACTION  1991    Social History   Socioeconomic History   Marital status: Married    Spouse name: Marcey   Number of children: 1   Years of education: 14   Highest education level: Not on file  Occupational History   Occupation: CMA  Tobacco Use   Smoking status: Never   Smokeless tobacco: Never  Vaping Use   Vaping status: Never Used  Substance and Sexual Activity   Alcohol use: Yes    Alcohol/week: 0.0 standard drinks of alcohol    Comment: rare, 1-2 drinks per year   Drug use: No   Sexual activity: Yes    Partners: Male    Birth control/protection: Surgical    Comment: Hysterectomy   Other Topics Concern   Not on file  Social History Narrative   Lives in [place] with husband, no pets.  Works at [employer].      Diet - regular   Exercise - occasional   Social Drivers of Health   Tobacco Use: Low Risk (07/23/2024)   Patient History    Smoking Tobacco Use: Never    Smokeless Tobacco Use: Never    Passive Exposure: Not on file  Financial Resource Strain: Low Risk  (01/16/2024)   Received from [facility]   Overall Financial Resource Strain (CARDIA)    Difficulty of Paying Living Expenses: Not hard at all  Food Insecurity: No Food Insecurity (07/04/2024)   Epic    Worried About Running Out of Food in the Last Year: Never true    Ran Out of Food in the Last Year: Never true  Transportation Needs: No Transportation Needs (07/04/2024)   Epic    Lack of Transportation (Medical): No    Lack of Transportation (Non-Medical): No  Physical Activity: Not on file  Stress: Not on file  Social Connections: Not on file  Intimate Partner Violence: Not At Risk (07/04/2024)   Epic    Fear of Current or Ex-Partner: No    Emotionally Abused: No    Physically Abused: No    Sexually Abused: No  Depression (PHQ2-9): Low Risk (07/23/2024)    Depression (PHQ2-9)    PHQ-2 Score: 0  Alcohol Screen: Not on file  Housing: Low Risk (07/04/2024)   Epic    Unable to Pay for Housing in the Last Year: No    Number of Times Moved in the Last Year: 0  Homeless in the Last Year: No  Utilities: Not At Risk (07/04/2024)   Epic    Threatened with loss of utilities: No  Health Literacy: Not on file    Family History  Problem Relation Age of Onset   Hypertension Mother    Thyroid  disease Mother    Breast cancer Mother 23   Colon cancer Mother 67       again at 71   Atrial fibrillation Mother    Hip fracture Mother    Skin cancer Mother    Stroke Father    Hypertension Father    Cancer Father 1       prostate   Parkinson's disease Father    Skin cancer Father    Atrial fibrillation Sister        Pacemaker inserted end of Sept-beginning of Oct. 2024   Hypertension Sister    Thyroid  disease Sister    Cancer Sister        skin/ squamous cell   Heart Problems Sister    Diabetes Maternal Aunt    Cancer Maternal Aunt    Breast cancer Maternal Aunt 69   Lymphoma Maternal Uncle    Heart disease Maternal Uncle    Melanoma Maternal Uncle 51   Lung cancer Paternal Aunt 106       smoker   Diabetes Maternal Grandmother    Stroke Maternal Grandfather    Heart disease Paternal Grandfather    Thyroid  cancer Cousin        maternal   Breast cancer Cousin 96       maternal   Breast cancer Cousin 101   Colon cancer Cousin     Current Medications[2]  Physical exam:  Vitals:   07/23/24 1117  BP: 122/67  Pulse: 73  Resp: 17  Temp: 98.9 F (37.2 C)  TempSrc: Tympanic  SpO2: 97%  Weight: 201 lb 1 oz (91.2 kg)   Physical Exam Eyes:     Pupils: Pupils are equal, round, and reactive to light.  Cardiovascular:     Rate and Rhythm: Normal rate and regular rhythm.     Heart sounds: Normal heart sounds.  Pulmonary:     Effort: Pulmonary effort is normal.     Breath sounds: Normal breath sounds.  Musculoskeletal:      Comments: Left ankle in brace due to tendinitis  Skin:    General: Skin is warm and dry.  Neurological:     Mental Status: She is alert and oriented to person, place, and time.      I have personally reviewed labs listed below:    Latest Ref Rng & Units 07/23/2024   11:01 AM  CMP  Glucose 70 - 99 mg/dL 75   BUN 8 - 23 mg/dL 12   Creatinine 9.55 - 1.00 mg/dL 9.06   Sodium 864 - 854 mmol/L 137   Potassium 3.5 - 5.1 mmol/L 4.8   Chloride 98 - 111 mmol/L 105   CO2 22 - 32 mmol/L 23   Calcium  8.9 - 10.3 mg/dL 9.0   Total Protein 6.5 - 8.1 g/dL 6.4   Total Bilirubin 0.0 - 1.2 mg/dL 0.3   Alkaline Phos 38 - 126 U/L 187   AST 15 - 41 U/L 31   ALT 0 - 44 U/L 15       Latest Ref Rng & Units 07/23/2024   11:01 AM  CBC  WBC 4.0 - 10.5 K/uL 4.2   Hemoglobin 12.0 - 15.0 g/dL 10.4  Hematocrit 36.0 - 46.0 % 34.5   Platelets 150 - 400 K/uL 263    I have personally reviewed Radiology images listed be  CT ABDOMEN PELVIS W CONTRAST Result Date: 07/04/2024 CLINICAL DATA:  Pain under left breast around to back EXAM: CT ABDOMEN AND PELVIS WITH CONTRAST TECHNIQUE: Multidetector CT imaging of the abdomen and pelvis was performed using the standard protocol following bolus administration of intravenous contrast. RADIATION DOSE REDUCTION: This exam was performed according to the departmental dose-optimization program which includes automated exposure control, adjustment of the mA and/or kV according to patient size and/or use of iterative reconstruction technique. CONTRAST:  OMNIPAQUE  IOHEXOL  350 MG/ML SOLN COMPARISON:  CT 05/15/2024, MRI 05/15/2024, CT 02/08/2024, 11/24/2023 FINDINGS: Lower chest: See separately dictated chest CT Hepatobiliary: Cholecystectomy. Prominence of the common bile duct, presumably due to postsurgical change. Numerous vague hypodense liver lesions, for example left hepatic 11 mm lesion on series 3, image 12, previously 9 mm. Vague right hepatic lobe hypodense lesions  measuring up to 9 mm, series 3, image 22. These are more conspicuous compared to the CT from July and correspond to numerous metastatic lesions on interval MRI. Pancreas: Unremarkable. No pancreatic ductal dilatation or surrounding inflammatory changes. Spleen: Normal in size without focal abnormality. Adrenals/Urinary Tract: Adrenal glands are normal. Kidneys show no hydronephrosis. 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 The bladder is unremarkable Stomach/Bowel: The stomach is within normal limits. No dilated small bowel. Large stool burden. No acute bowel wall thickening Vascular/Lymphatic: No significant vascular findings are present. No enlarged abdominal or pelvic lymph nodes. Reproductive: Hysterectomy.  No adnexal mass Other: No ascites or free air Musculoskeletal: Wide spread lucent and sclerotic skeletal metastatic disease which appears slightly progressive compared with July exam. 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 Stable small 12 mm subcutaneous nodule in the right posterior paraspinal fat, series series 35. IMPRESSION: 1. No CT evidence for acute intra-abdominal or pelvic abnormality. 2. Wide spread lucent and sclerotic skeletal metastatic disease which appears slightly progressive compared with July exam. 3. Numerous vague hypodense liver lesions, more conspicuous compared to the CT from July and correspond to numerous metastatic lesions on interval recent MRI. 4. 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 Electronically Signed   By: Luke Bun M.D.   On: 07/04/2024 17:00   CT Angio Chest PE W and/or Wo Contrast Result Date: 07/04/2024 CLINICAL DATA:  Pain under left breast worse with movement EXAM: CT ANGIOGRAPHY CHEST WITH CONTRAST TECHNIQUE: Multidetector CT imaging of the chest was  performed using the standard protocol during bolus administration of intravenous contrast. Multiplanar CT image reconstructions and MIPs were obtained to evaluate the vascular anatomy. RADIATION DOSE REDUCTION: This exam was performed according to the departmental dose-optimization program which includes automated exposure control, adjustment of the mA and/or kV according to patient size and/or use of iterative reconstruction technique. CONTRAST:  OMNIPAQUE  IOHEXOL  350 MG/ML SOLN COMPARISON:  CT 05/15/2024, PET CT 03/19/2024, 02/08/2024 FINDINGS: Cardiovascular: Satisfactory opacification of the pulmonary arteries to the segmental level. No evidence of pulmonary embolism. Left-sided central venous port with tip in the right atrium. Nonaneurysmal aorta. No dissection. Cardiomegaly. No pericardial effusion Mediastinum/Nodes: Patent trachea. No thyroid  mass. No suspicious mediastinal or hilar nodes. Esophagus within normal limits Lungs/Pleura: No pleural effusion or pneumothorax. Interval patchy mosaic density in the bilateral lungs. Right  middle lobe nodular density measuring 6 x 4 mm on series 5, image 64, unchanged, has slightly scar like configuration on coronal images. Upper Abdomen: See separately dictated CT Musculoskeletal: Widespread lucent and sclerotic skeletal metastatic disease involving the sternum, spine, ribs, scapula, and clavicles. Chronic right third posterolateral rib fracture. Possible subtle acute nondisplaced left anterior 6 rib fracture, series 4, image 84. Review of the MIP images confirms the above findings. IMPRESSION: 1. Negative for acute pulmonary embolus or aortic dissection. 2. Interval development of patchy mosaic density in the bilateral lungs, possible small airways disease. 3. Widespread skeletal metastatic disease as before. Possible subtle acute nondisplaced left anterior sixth rib fracture. 4. Stable 6 x 4 mm right middle lobe nodular density, question scar like  configuration on today's study. Continued attention on imaging follow-up. Electronically Signed   By: Luke Bun M.D.   On: 07/04/2024 16:48   CT HEAD WO CONTRAST ( ) Result Date: 07/04/2024 EXAM: CT HEAD WITHOUT CONTRAST 07/04/2024 04:26:04 PM TECHNIQUE: CT of the head was performed without the administration of intravenous contrast. Automated exposure control, iterative reconstruction, and/or weight based adjustment of the mA/kV was utilized to reduce the radiation dose to as low as reasonably achievable. COMPARISON: Head CT 09/17/2022 and MRI 09/01/2022. CLINICAL HISTORY: Headache, new onset. FINDINGS: BRAIN AND VENTRICLES: There is no evidence of an acute infarct, intracranial hemorrhage, mass, midline shift, hydrocephalus, or extra-axial fluid collection. Cerebral volume is normal. 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 ORBITS: No acute abnormality. SINUSES: Small mucous retention cyst in the left maxillary sinus. Small right mastoid effusion. SOFT TISSUES AND SKULL: No acute soft tissue abnormality. No skull fracture. IMPRESSION: 1. No acute intracranial abnormality. 2. Small chronic left cerebellar infarct. Electronically signed by: Dasie Hamburg MD 07/04/2024 04:32 PM EST RP Workstation: [id number]   US  Venous Img Lower Unilateral Left Result Date: 07/04/2024 EXAM: ULTRASOUND DUPLEX OF THE LEFT LOWER EXTREMITY VEINS TECHNIQUE: Duplex ultrasound using B-mode/gray scaled imaging and Doppler spectral analysis and color flow was obtained of the deep venous structures of the left lower extremity. COMPARISON: US  Left Lower extremity venous 11/28/2020. CLINICAL HISTORY: Left lower extremity swelling for 3 weeks. FINDINGS: The common femoral vein, femoral vein, popliteal vein, and posterior tibial vein demonstrate normal compressibility with normal color flow and spectral analysis. Survey views of the contralateral common femoral vein are unremarkable. IMPRESSION: 1. No evidence of deep  venous thrombosis. Electronically signed by: Katheleen Faes MD 07/04/2024 02:38 PM EST RP Workstation: [id number]   DG Chest 2 View Result Date: 07/04/2024 CLINICAL DATA:  left chest pain EXAM: CHEST - 2 VIEW COMPARISON:  February 16, 2022, May 15, 2024 FINDINGS: The cardiomediastinal silhouette is unchanged in contour.LEFT-sided chest port with tip terminating over the superior cavoatrial junction. No pleural effusion. No pneumothorax. No acute pleuroparenchymal abnormality. Visualized abdomen is unremarkable. Mottled lucency of the RIGHT scapula and thoracic spine consistent with known osseous metastatic disease with better assessed on prior cross-sectional imaging. IMPRESSION: 1. No acute cardiopulmonary abnormality. 2. Known osseous metastatic disease. Electronically Signed   By: Corean Salter M.D.   On: 07/04/2024 13:01     Assessment and plan- Patient is a 64 y.o. female with history of metastatic ER positive breast cancer PR negative HER2 equivocal on IHC here for on treatment assessment prior to cycle 3 of Enhertu   Counts okay to proceed with cycle 3 of Enhertu  today.  I will see her back in 3 weeks for cycle 4.  I am planning to get MRI  liver after 4 cycles to see if there is evidence of progression along with bone scan.  Will also get an echocardiogram towards the end of next month.  While patient was admitted to the hospital for left chest wall pain she had a CT chest abdomen and pelvis with contrast which showed findings concerning for progression in the liver but she had only received 2 cycles of Enhertu  at that time.  If she has disease progression on Enhertu  we will have to switch her to chemo based treatments.  Neoplasm related pain: Continue long-acting morphine  and as needed Dilaudid .   Visit Diagnosis 1. Primary malignant neoplasm of breast with metastasis (HCC)   2. Encounter for monoclonal antibody treatment for malignancy      Dr. Annah Skene, MD, MPH [facility] [phone] 07/23/2024 4:03 PM                   [1]  Allergies Allergen Reactions   Kiwi Extract Itching and Swelling    The real kiwi fruit   Codeine Other (See Comments)    Felt funny.   Food Itching and Swelling    grapes   Amoxicillin Rash   Erythromycin Rash   Sulfa  Antibiotics Rash   Tape Rash    Adhesives  Pt can have paper tape  [2]  Current Outpatient Medications:    aspirin EC 81 MG tablet, Take by mouth., Disp: , Rfl:    b complex vitamins capsule, Take 1 capsule by mouth daily., Disp: , Rfl:    Blood Glucose Monitoring Suppl (BLOOD GLUCOSE MONITOR SYSTEM) w/Device KIT, Use to test blood sugars in the morning, at noon, and at bedtime., Disp: 1 kit, Rfl: 0   buPROPion  (WELLBUTRIN  XL) 150 MG 24 hr tablet, Take 1 tablet (150 mg total) by mouth daily. Take along a 300mg  tablet for a total daily dose of 450mg ., Disp: 90 tablet, Rfl: 3   buPROPion  (WELLBUTRIN  XL) 300 MG 24 hr tablet, Take 1 tablet (300 mg total) by mouth daily., Disp: 90 tablet, Rfl: 3   busPIRone  (BUSPAR ) 10 MG tablet, Take 1 tablet (10 mg total) by mouth 2 (two) times daily, Disp: 180 tablet, Rfl: 1   calcium  carbonate (OSCAL) 1500 (600 Ca) MG TABS tablet, Take by mouth 2 (two) times daily with a meal., Disp: , Rfl:    capivasertib  (TRUQAP ) 160 MG pack, Take 2 tablets (320 mg total) by mouth 2 (two) times daily. Take for 4 days, then hold for 3 days. Repeat every 7 days., Disp: 64 tablet, Rfl: 0   Cholecalciferol  (VITAMIN D3) 10 MCG (400 UNIT) CAPS, Take by mouth., Disp: , Rfl:    citalopram  (CELEXA ) 40 MG tablet, Take 1 tablet (40 mg total) by mouth daily., Disp: 90 tablet, Rfl: 3   clotrimazole  (LOTRIMIN ) 1 % cream, Apply topically 2 (two) times daily., Disp: , Rfl:    diphenhydrAMINE  (BENADRYL ) 25 MG tablet, Take 25 mg by mouth at bedtime as needed for sleep. Pt states daily now for sleep and allergies., Disp: , Rfl:    diphenoxylate -atropine  (LOMOTIL ) 2.5-0.025 MG  tablet, Take 2 tablets by mouth 4 (four) times daily as needed for diarrhea or loose stools., Disp: 60 tablet, Rfl: 0   gabapentin  (NEURONTIN ) 100 MG capsule, Take 1 capsule (100 mg total)  By mouth every morning , THEN 2 capsules (200 mg total) every evening., Disp: 270 capsule, Rfl: 0   HYDROmorphone  (DILAUDID ) 2 MG tablet, Take 2 tablets (4  mg total) by mouth every 6 (six) hours as needed for severe pain (pain score 7-10)., Disp: 120 tablet, Rfl: 0   ketotifen  (ZADITOR ) 0.025 % ophthalmic solution, Place 1 drop into both eyes daily., Disp: , Rfl:    lidocaine -prilocaine  (EMLA ) cream, Apply to affected area once as directed., Disp: 30 g, Rfl: 3   Magnesium  Glycinate 100 MG CAPS, , Disp: , Rfl:    metFORMIN  (GLUCOPHAGE -XR) 500 MG 24 hr tablet, Take 1 tablet (500 mg total) by mouth 2 (two) times daily with a meal., Disp: 180 tablet, Rfl: 1   midodrine  (PROAMATINE ) 10 MG tablet, Take 1 tablet (10 mg total) by mouth 3 (three) times daily with meals., Disp: 90 tablet, Rfl: 0   morphine  (MS CONTIN ) 30 MG 12 hr tablet, Take 1 tablet (30 mg total) by mouth every 8 (eight) hours., Disp: 90 tablet, Rfl: 0   Multiple Vitamin (MULTIVITAMIN) tablet, Take 1 tablet by mouth daily., Disp: , Rfl:    nitrofurantoin , macrocrystal-monohydrate, (MACROBID ) 100 MG capsule, Take 1 capsule (100 mg total) by mouth 2 (two) times daily., Disp: 14 capsule, Rfl: 0   omeprazole  (PRILOSEC) 20 MG capsule, Take 1 capsule (20 mg total) by mouth daily., Disp: 90 capsule, Rfl: 3   ondansetron  (ZOFRAN ) 4 MG tablet, Take 1 tablet (4 mg total) by mouth every 8 (eight) hours as needed for nausea or vomiting., Disp: 90 tablet, Rfl: 0   ondansetron  (ZOFRAN ) 8 MG tablet, Take 1 tablet (8 mg total) by mouth every 8 (eight) hours as needed for nausea or vomiting. Start on the third day after chemotherapy., Disp: 30 tablet, Rfl: 1 No current facility-administered medications for this visit.  Facility-Administered Medications Ordered in Other  Visits:    dextrose  5 % solution, , Intravenous, Continuous, Melanee Annah BROCKS, MD, Stopped at 07/23/24 1350  "

## 2024-07-24 ENCOUNTER — Other Ambulatory Visit: Payer: Self-pay

## 2024-07-25 ENCOUNTER — Other Ambulatory Visit: Payer: Self-pay

## 2024-07-27 ENCOUNTER — Other Ambulatory Visit (HOSPITAL_COMMUNITY): Payer: Self-pay

## 2024-07-27 ENCOUNTER — Other Ambulatory Visit: Payer: Self-pay

## 2024-07-27 MED ORDER — BUPROPION HCL ER (XL) 300 MG PO TB24
300.0000 mg | ORAL_TABLET | Freq: Every day | ORAL | 3 refills | Status: AC
  Filled 2024-08-04: qty 90, 90d supply, fill #0

## 2024-08-03 ENCOUNTER — Encounter: Payer: Self-pay | Admitting: Oncology

## 2024-08-03 ENCOUNTER — Other Ambulatory Visit: Payer: Self-pay

## 2024-08-03 MED ORDER — HYDROMORPHONE HCL 2 MG PO TABS: 4.0000 mg | ORAL_TABLET | Freq: Four times a day (QID) | ORAL | 0 refills | Status: DC | PRN

## 2024-08-03 NOTE — Addendum Note (Signed)
 Addended by: Shauni Henner on: 08/03/2024 04:22 PM   Modules accepted: Orders

## 2024-08-05 ENCOUNTER — Other Ambulatory Visit (HOSPITAL_COMMUNITY): Payer: Self-pay

## 2024-08-05 ENCOUNTER — Other Ambulatory Visit: Payer: Self-pay

## 2024-08-07 ENCOUNTER — Ambulatory Visit: Admitting: Podiatry

## 2024-08-08 ENCOUNTER — Ambulatory Visit
Admission: RE | Admit: 2024-08-08 | Discharge: 2024-08-08 | Disposition: A | Source: Ambulatory Visit | Attending: Oncology

## 2024-08-08 ENCOUNTER — Other Ambulatory Visit

## 2024-08-08 DIAGNOSIS — C50919 Malignant neoplasm of unspecified site of unspecified female breast: Secondary | ICD-10-CM | POA: Insufficient documentation

## 2024-08-08 LAB — ECHOCARDIOGRAM COMPLETE
AR max vel: 1.69 cm2
AV Area VTI: 1.65 cm2
AV Area mean vel: 1.68 cm2
AV Mean grad: 4 mmHg
AV Peak grad: 8.6 mmHg
Ao pk vel: 1.47 m/s
Area-P 1/2: 3.3 cm2
MV VTI: 1.37 cm2
S' Lateral: 3.2 cm

## 2024-08-09 ENCOUNTER — Telehealth: Payer: Self-pay | Admitting: Pharmacy Technician

## 2024-08-09 ENCOUNTER — Telehealth: Payer: Self-pay

## 2024-08-09 ENCOUNTER — Other Ambulatory Visit (HOSPITAL_COMMUNITY): Payer: Self-pay

## 2024-08-09 NOTE — Telephone Encounter (Signed)
 Oral Oncology Patient Advocate Encounter   New authorization   Received notification that prior authorization for HYDROmorphone  (DILAUDID ) 2 MG tablet is required.   PA submitted on CMM via [employer] [id number] Status is pending     Asja Frommer ([name]) Chet Burnet, CPhT  [facility], [place], [facility] - Oral Chemotherapy Patient Advocate Specialist III Phone: [phone]  Fax: [phone]

## 2024-08-09 NOTE — Telephone Encounter (Signed)
 Hydromorphone  2 mg requiring PA. Please initiate.   PA sent to [facility] RX PA pool

## 2024-08-10 ENCOUNTER — Encounter: Payer: Self-pay | Admitting: Oncology

## 2024-08-10 ENCOUNTER — Other Ambulatory Visit: Payer: Self-pay

## 2024-08-10 ENCOUNTER — Other Ambulatory Visit (HOSPITAL_COMMUNITY): Payer: Self-pay

## 2024-08-10 DIAGNOSIS — C50919 Malignant neoplasm of unspecified site of unspecified female breast: Secondary | ICD-10-CM

## 2024-08-10 MED ORDER — MIDODRINE HCL 2.5 MG PO TABS
2.5000 mg | ORAL_TABLET | Freq: Three times a day (TID) | ORAL | 3 refills | Status: AC
  Filled 2024-08-10: qty 90, 30d supply, fill #0

## 2024-08-10 MED FILL — Fosaprepitant Dimeglumine For IV Infusion 150 MG (Base Eq): INTRAVENOUS | Qty: 5 | Status: AC

## 2024-08-12 ENCOUNTER — Telehealth: Payer: Self-pay | Admitting: Oncology

## 2024-08-12 ENCOUNTER — Other Ambulatory Visit: Payer: Self-pay | Admitting: Oncology

## 2024-08-12 DIAGNOSIS — C50919 Malignant neoplasm of unspecified site of unspecified female breast: Secondary | ICD-10-CM

## 2024-08-12 NOTE — Telephone Encounter (Signed)
 Called patient and informed her that we would be close tomorrow due to weather. We got her rescheduled to Thursday. Patient confirmed that this new date and time would work for her

## 2024-08-13 ENCOUNTER — Inpatient Hospital Stay

## 2024-08-13 ENCOUNTER — Inpatient Hospital Stay: Admitting: Oncology

## 2024-08-13 NOTE — Telephone Encounter (Signed)
 Oral Oncology Patient Advocate Encounter  Prior Authorization for HYDROmorphone  (DILAUDID ) 2 MG tablet has been approved.    PA# [id number] Effective dates: 08/12/2024 through 06/11/1936   Rebacca Votaw ([name]) Chet Burnet, CPhT  [facility], [place], [facility] - Oral Chemotherapy Pharmacy Technician III Certified Patient Advocate Phone: [phone]  Fax: [phone]

## 2024-08-14 ENCOUNTER — Encounter: Payer: Self-pay | Admitting: Oncology

## 2024-08-14 ENCOUNTER — Ambulatory Visit: Admitting: Podiatry

## 2024-08-14 DIAGNOSIS — Z01818 Encounter for other preprocedural examination: Secondary | ICD-10-CM

## 2024-08-14 DIAGNOSIS — M7672 Peroneal tendinitis, left leg: Secondary | ICD-10-CM | POA: Diagnosis not present

## 2024-08-14 NOTE — Patient Instructions (Signed)
For instructions on how to put on your Tri-Lock Ankle Brace, please visit [email or web address] 

## 2024-08-14 NOTE — Progress Notes (Unsigned)
 Injection and trilock ankle brace

## 2024-08-14 NOTE — Telephone Encounter (Signed)
 Ok to order

## 2024-08-15 ENCOUNTER — Encounter: Payer: Self-pay | Admitting: Oncology

## 2024-08-15 ENCOUNTER — Other Ambulatory Visit: Payer: Self-pay

## 2024-08-16 ENCOUNTER — Other Ambulatory Visit: Payer: Self-pay

## 2024-08-16 ENCOUNTER — Inpatient Hospital Stay: Attending: Oncology

## 2024-08-16 ENCOUNTER — Inpatient Hospital Stay: Admitting: Oncology

## 2024-08-16 ENCOUNTER — Inpatient Hospital Stay

## 2024-08-16 ENCOUNTER — Encounter: Payer: Self-pay | Admitting: Oncology

## 2024-08-16 ENCOUNTER — Inpatient Hospital Stay: Attending: Oncology | Admitting: Oncology

## 2024-08-16 VITALS — BP 115/59 | HR 72 | Temp 97.2°F | Resp 18 | Ht 63.0 in | Wt 201.6 lb

## 2024-08-16 VITALS — BP 106/57 | HR 71 | Temp 98.0°F | Resp 19

## 2024-08-16 DIAGNOSIS — C50919 Malignant neoplasm of unspecified site of unspecified female breast: Secondary | ICD-10-CM

## 2024-08-16 LAB — CBC WITH DIFFERENTIAL (CANCER CENTER ONLY)
Abs Immature Granulocytes: 0.01 10*3/uL (ref 0.00–0.07)
Basophils Absolute: 0 10*3/uL (ref 0.0–0.1)
Basophils Relative: 0 %
Eosinophils Absolute: 0.1 10*3/uL (ref 0.0–0.5)
Eosinophils Relative: 3 %
HCT: 33.6 % — ABNORMAL LOW (ref 36.0–46.0)
Hemoglobin: 10.3 g/dL — ABNORMAL LOW (ref 12.0–15.0)
Immature Granulocytes: 0 %
Lymphocytes Relative: 21 %
Lymphs Abs: 0.8 10*3/uL (ref 0.7–4.0)
MCH: 28.6 pg (ref 26.0–34.0)
MCHC: 30.7 g/dL (ref 30.0–36.0)
MCV: 93.3 fL (ref 80.0–100.0)
Monocytes Absolute: 0.6 10*3/uL (ref 0.1–1.0)
Monocytes Relative: 18 %
Neutro Abs: 2.1 10*3/uL (ref 1.7–7.7)
Neutrophils Relative %: 58 %
Platelet Count: 237 10*3/uL (ref 150–400)
RBC: 3.6 MIL/uL — ABNORMAL LOW (ref 3.87–5.11)
RDW: 25.2 % — ABNORMAL HIGH (ref 11.5–15.5)
WBC Count: 3.6 10*3/uL — ABNORMAL LOW (ref 4.0–10.5)
nRBC: 0 % (ref 0.0–0.2)

## 2024-08-16 LAB — CMP (CANCER CENTER ONLY)
ALT: 12 U/L (ref 0–44)
AST: 27 U/L (ref 15–41)
Albumin: 3.8 g/dL (ref 3.5–5.0)
Alkaline Phosphatase: 150 U/L — ABNORMAL HIGH (ref 38–126)
Anion gap: 12 (ref 5–15)
BUN: 11 mg/dL (ref 8–23)
CO2: 23 mmol/L (ref 22–32)
Calcium: 9.1 mg/dL (ref 8.9–10.3)
Chloride: 107 mmol/L (ref 98–111)
Creatinine: 0.98 mg/dL (ref 0.44–1.00)
GFR, Estimated: >60 mL/min
Glucose, Bld: 116 mg/dL — ABNORMAL HIGH (ref 70–99)
Potassium: 4.2 mmol/L (ref 3.5–5.1)
Sodium: 142 mmol/L (ref 135–145)
Total Bilirubin: 0.3 mg/dL (ref 0.0–1.2)
Total Protein: 6.1 g/dL — ABNORMAL LOW (ref 6.5–8.1)

## 2024-08-16 MED ORDER — DIPHENHYDRAMINE HCL 25 MG PO TABS
50.0000 mg | ORAL_TABLET | Freq: Once | ORAL | Status: AC
  Administered 2024-08-16: 50 mg via ORAL
  Filled 2024-08-16: qty 2

## 2024-08-16 MED ORDER — PALONOSETRON HCL INJECTION 0.25 MG/5ML
0.2500 mg | Freq: Once | INTRAVENOUS | Status: AC
  Administered 2024-08-16: 0.25 mg via INTRAVENOUS
  Filled 2024-08-16: qty 5

## 2024-08-16 MED ORDER — PROCHLORPERAZINE EDISYLATE 10 MG/2ML IJ SOLN
10.0000 mg | Freq: Once | INTRAMUSCULAR | Status: AC
  Administered 2024-08-16: 10 mg via INTRAVENOUS
  Filled 2024-08-16: qty 2

## 2024-08-16 MED ORDER — ACETAMINOPHEN 325 MG PO TABS
650.0000 mg | ORAL_TABLET | Freq: Once | ORAL | Status: AC
  Administered 2024-08-16: 650 mg via ORAL
  Filled 2024-08-16: qty 2

## 2024-08-16 MED ORDER — SODIUM CHLORIDE 0.9 % IV SOLN
150.0000 mg | Freq: Once | INTRAVENOUS | Status: AC
  Administered 2024-08-16: 150 mg via INTRAVENOUS
  Filled 2024-08-16: qty 150
  Filled 2024-08-16: qty 5

## 2024-08-16 MED ORDER — FAM-TRASTUZUMAB DERUXTECAN-NXKI CHEMO 100 MG IV SOLR
5.4000 mg/kg | Freq: Once | INTRAVENOUS | Status: AC
  Administered 2024-08-16: 500 mg via INTRAVENOUS
  Filled 2024-08-16: qty 25

## 2024-08-16 MED ORDER — HYDROMORPHONE HCL 4 MG PO TABS
4.0000 mg | ORAL_TABLET | Freq: Four times a day (QID) | ORAL | 0 refills | Status: AC | PRN
  Filled 2024-08-16: qty 120, 30d supply, fill #0

## 2024-08-16 MED ORDER — DEXTROSE 5 % IV SOLN
INTRAVENOUS | Status: DC
  Filled 2024-08-16: qty 250

## 2024-08-16 NOTE — Patient Instructions (Signed)
 Device With a Small Disc Placed for Long-Term IV Use (Implanted Port): Care at Home An implanted port is a device with a small disc that's put under your skin. In most cases, it's placed in your chest. It can be used to draw blood and send medicines and fluids quickly into your bloodstream. You may need a port to give you: IV medicines that would bother the small veins in your hands or arms. IV medicines for a long time, such as medicines to kill cancer cells (chemotherapy). IV liquid nutrition for a long time. An implanted port has 2 main parts: The portal. This is the round disc where the needle is put in. It may look like a small, raised area under your skin. The catheter. This is a soft tube that connects the port to a vein. You may be able to do more everyday tasks with a port than with other types of long-term IVs. How is my port accessed?  A numbing cream may be put on the skin over the port site. Your skin will be cleaned with a solution that kills germs. Your health care provider will gently pinch the port and put a needle in it. The port will be checked to make sure it's in the vein and working. If you need to get medicine all the time, the needle may be left in the port. Your provider will put a clear bandage over the needle site. The bandage and needle will need to be changed each week. What is flushing? Flushing helps keep the port working like it should. Flush your port as told. If your port is only used from time to time to give medicines or draw blood, you may need to flush it: Before and after medicines have been given. Before and after blood has been drawn. Every 4-6 weeks to keep it working well. If you get medicine through your port all the time, you may not need to flush it. Talk with your provider to learn more. How long will my port stay in? The port will stay in for as long as it's needed. When it's time for it to come out, you'll have a procedure to remove it. Follow  these instructions at home: Caring for your port and port site Flush your port as told. If you need to get an infusion of medicine over a few days, take care of your port as told. Make sure you: Take care of your port site. Wash your hands with soap and water  for at least 20 seconds before and after you change your bandage. If you can't use soap and water , use hand sanitizer. Put any used bandages or infusion bags in a plastic bag. Throw the bag in the trash. Keep the bandage that covers the needle clean and dry. Do not let it get wet. Do not use scissors or sharp objects near the tubes. Keep any tubes clamped, unless they're being used. Check the area around your port site every day for signs of infection. Check for: Redness, swelling, or pain. Fluid or blood. Warmth. Pus or a bad smell. Protect the skin around the port site. Avoid wearing bra straps that rub or irritate the site. Protect the skin around your port from seat belts. Place a soft pad over your chest if needed. Do not take baths, swim, or use a hot tub until you're told it's OK. Ask if you can shower. General instructions  Throw away any syringes in a container that's meant for sharp  items (sharps container). You can buy a sharps container from a pharmacy. You can also make one by using an empty, hard plastic bottle with a lid. Always carry a medical alert card or wear a medical alert bracelet. Ask what things are safe for you to do at home. Ask when you can go back to work or school. Where to find more information To learn more, go to: American Cancer Society at prombar.it. Click on the magnifying glass and type IV lines and ports. Find the link you need. American Society of Clinical Oncology at fabvets.de. Click on the magnifying glass and type getting chemo infusions. Find the link you need. Contact a health care provider if: You can't flush your port. You can't draw blood from the port. You have a fever or  chills. You have any signs of infection. You have swelling in your arm, neck, or shoulder. This information is not intended to replace advice given to you by your health care provider. Make sure you discuss any questions you have with your health care provider. Document Revised: 04/10/2024 Document Reviewed: 04/10/2024 Elsevier Patient Education  2025 [employer].

## 2024-08-16 NOTE — Progress Notes (Unsigned)
 Patient doing good; no new or acute concerns at this time.

## 2024-08-16 NOTE — Progress Notes (Unsigned)
 Outbound call to [facility] in [place] [phone]; spoke w/ Constance who indicated patient did not pick up dilaudid  last issued 1/23 due to 1) it not being in stock and 2) insurance doesn't cover beyond the maximum of 4 tablets of per day.   Per Dr. Melanee change prescription to 4 mg dilaudid  tablet Q6 prn 120 tab no refills.  Called [facility] [place] is closed for lunch break.  Outbound call to [facility] pharmacy to inquire if in stock; spoke to [place] who verified in stock. Will pend script to Dr. Melanee for review.

## 2024-08-17 LAB — HEMOGLOBIN A1C
Hgb A1c MFr Bld: 5 % (ref 4.8–5.6)
Mean Plasma Glucose: 96.8 mg/dL

## 2024-08-17 LAB — CANCER ANTIGEN 15-3: CA 15-3: 176 U/mL — ABNORMAL HIGH (ref 0.0–25.0)

## 2024-08-23 ENCOUNTER — Ambulatory Visit

## 2024-08-28 ENCOUNTER — Ambulatory Visit

## 2024-09-07 ENCOUNTER — Inpatient Hospital Stay

## 2024-09-07 ENCOUNTER — Inpatient Hospital Stay: Admitting: Oncology

## 2024-09-11 ENCOUNTER — Ambulatory Visit: Admitting: Podiatry
